# Patient Record
Sex: Male | Born: 1940 | Race: Black or African American | Hispanic: No | Marital: Married | Smoking: Former smoker
Health system: Southern US, Community
[De-identification: ages and names within clinical notes are randomized; demographics above are authoritative.]

## PROBLEM LIST (undated history)

## (undated) ENCOUNTER — Emergency Department (HOSPITAL_COMMUNITY): Payer: Medicare Other | Source: Home / Self Care

## (undated) DIAGNOSIS — E669 Obesity, unspecified: Secondary | ICD-10-CM

## (undated) DIAGNOSIS — I82621 Acute embolism and thrombosis of deep veins of right upper extremity: Secondary | ICD-10-CM

## (undated) DIAGNOSIS — N259 Disorder resulting from impaired renal tubular function, unspecified: Secondary | ICD-10-CM

## (undated) DIAGNOSIS — D649 Anemia, unspecified: Secondary | ICD-10-CM

## (undated) DIAGNOSIS — G4730 Sleep apnea, unspecified: Secondary | ICD-10-CM

## (undated) DIAGNOSIS — J189 Pneumonia, unspecified organism: Secondary | ICD-10-CM

## (undated) DIAGNOSIS — I10 Essential (primary) hypertension: Secondary | ICD-10-CM

## (undated) HISTORY — DX: Anemia, unspecified: D64.9

## (undated) HISTORY — DX: Essential (primary) hypertension: I10

## (undated) HISTORY — DX: Pneumonia, unspecified organism: J18.9

## (undated) HISTORY — DX: Sleep apnea, unspecified: G47.30

## (undated) HISTORY — DX: Obesity, unspecified: E66.9

## (undated) HISTORY — DX: Disorder resulting from impaired renal tubular function, unspecified: N25.9

## (undated) HISTORY — PX: OTHER SURGICAL HISTORY: SHX169

---

## 2007-01-27 ENCOUNTER — Ambulatory Visit: Payer: Self-pay | Admitting: Cardiology

## 2007-02-07 ENCOUNTER — Encounter: Payer: Self-pay | Admitting: Cardiology

## 2007-02-07 ENCOUNTER — Ambulatory Visit: Payer: Self-pay

## 2007-02-21 ENCOUNTER — Ambulatory Visit: Payer: Self-pay | Admitting: Cardiology

## 2007-03-07 ENCOUNTER — Ambulatory Visit: Payer: Self-pay | Admitting: Cardiology

## 2007-03-07 LAB — CONVERTED CEMR LAB
BUN: 16 mg/dL (ref 6–23)
CO2: 29 meq/L (ref 19–32)
Calcium: 9.6 mg/dL (ref 8.4–10.5)
Chloride: 104 meq/L (ref 96–112)
Creatinine, Ser: 1.6 mg/dL — ABNORMAL HIGH (ref 0.4–1.5)
GFR calc non Af Amer: 46 mL/min
Glucose, Bld: 87 mg/dL (ref 70–99)
Potassium: 4.3 meq/L (ref 3.5–5.1)

## 2007-08-09 ENCOUNTER — Ambulatory Visit: Payer: Self-pay | Admitting: Gastroenterology

## 2007-08-21 ENCOUNTER — Ambulatory Visit: Payer: Self-pay | Admitting: Gastroenterology

## 2007-08-21 ENCOUNTER — Encounter: Payer: Self-pay | Admitting: Gastroenterology

## 2007-08-23 ENCOUNTER — Encounter: Payer: Self-pay | Admitting: Gastroenterology

## 2007-08-23 ENCOUNTER — Ambulatory Visit: Payer: Self-pay | Admitting: Cardiology

## 2007-11-10 ENCOUNTER — Ambulatory Visit: Payer: Self-pay | Admitting: Cardiology

## 2008-01-15 ENCOUNTER — Ambulatory Visit: Payer: Self-pay | Admitting: Cardiology

## 2008-02-22 ENCOUNTER — Ambulatory Visit (HOSPITAL_BASED_OUTPATIENT_CLINIC_OR_DEPARTMENT_OTHER): Admission: RE | Admit: 2008-02-22 | Discharge: 2008-02-22 | Payer: Self-pay | Admitting: Cardiology

## 2008-02-22 ENCOUNTER — Encounter: Payer: Self-pay | Admitting: Pulmonary Disease

## 2008-02-27 ENCOUNTER — Ambulatory Visit: Payer: Self-pay | Admitting: Pulmonary Disease

## 2008-02-28 ENCOUNTER — Ambulatory Visit: Payer: Self-pay | Admitting: Cardiology

## 2008-05-02 ENCOUNTER — Ambulatory Visit: Payer: Self-pay

## 2008-05-23 ENCOUNTER — Encounter: Payer: Self-pay | Admitting: Pulmonary Disease

## 2008-05-23 ENCOUNTER — Ambulatory Visit (HOSPITAL_BASED_OUTPATIENT_CLINIC_OR_DEPARTMENT_OTHER): Admission: RE | Admit: 2008-05-23 | Discharge: 2008-05-23 | Payer: Self-pay | Admitting: Cardiology

## 2008-05-25 ENCOUNTER — Ambulatory Visit: Payer: Self-pay | Admitting: Pulmonary Disease

## 2008-06-28 DIAGNOSIS — I10 Essential (primary) hypertension: Secondary | ICD-10-CM | POA: Insufficient documentation

## 2008-06-28 DIAGNOSIS — I429 Cardiomyopathy, unspecified: Secondary | ICD-10-CM | POA: Insufficient documentation

## 2008-06-28 DIAGNOSIS — I428 Other cardiomyopathies: Secondary | ICD-10-CM | POA: Insufficient documentation

## 2008-08-26 ENCOUNTER — Ambulatory Visit: Payer: Self-pay | Admitting: Cardiology

## 2008-08-26 DIAGNOSIS — E669 Obesity, unspecified: Secondary | ICD-10-CM | POA: Insufficient documentation

## 2008-08-26 DIAGNOSIS — N259 Disorder resulting from impaired renal tubular function, unspecified: Secondary | ICD-10-CM | POA: Insufficient documentation

## 2008-09-11 ENCOUNTER — Ambulatory Visit: Payer: Self-pay | Admitting: Cardiology

## 2008-09-11 DIAGNOSIS — I5022 Chronic systolic (congestive) heart failure: Secondary | ICD-10-CM | POA: Insufficient documentation

## 2008-09-12 ENCOUNTER — Telehealth (INDEPENDENT_AMBULATORY_CARE_PROVIDER_SITE_OTHER): Payer: Self-pay | Admitting: *Deleted

## 2008-09-12 ENCOUNTER — Telehealth: Payer: Self-pay | Admitting: Cardiology

## 2008-09-19 ENCOUNTER — Telehealth: Payer: Self-pay | Admitting: Cardiology

## 2008-09-20 LAB — CONVERTED CEMR LAB
BUN: 18 mg/dL (ref 6–23)
CO2: 28 meq/L (ref 19–32)
Calcium: 9.1 mg/dL (ref 8.4–10.5)
Chloride: 107 meq/L (ref 96–112)
GFR calc non Af Amer: 55.61 mL/min (ref >60)
Glucose, Bld: 115 mg/dL — ABNORMAL HIGH (ref 70–99)
Sodium: 139 meq/L (ref 135–145)

## 2009-01-09 ENCOUNTER — Ambulatory Visit: Payer: Self-pay | Admitting: Cardiology

## 2009-01-23 ENCOUNTER — Ambulatory Visit: Payer: Self-pay | Admitting: Pulmonary Disease

## 2009-01-23 DIAGNOSIS — G4733 Obstructive sleep apnea (adult) (pediatric): Secondary | ICD-10-CM | POA: Insufficient documentation

## 2009-02-13 ENCOUNTER — Encounter: Payer: Self-pay | Admitting: Pulmonary Disease

## 2009-04-08 ENCOUNTER — Encounter (INDEPENDENT_AMBULATORY_CARE_PROVIDER_SITE_OTHER): Payer: Self-pay | Admitting: *Deleted

## 2009-05-22 ENCOUNTER — Encounter: Payer: Self-pay | Admitting: Cardiology

## 2009-05-28 ENCOUNTER — Ambulatory Visit: Payer: Self-pay | Admitting: Pulmonary Disease

## 2009-06-13 ENCOUNTER — Ambulatory Visit: Payer: Self-pay | Admitting: Cardiology

## 2009-08-13 ENCOUNTER — Ambulatory Visit: Payer: Self-pay | Admitting: Internal Medicine

## 2009-08-13 DIAGNOSIS — J45909 Unspecified asthma, uncomplicated: Secondary | ICD-10-CM | POA: Insufficient documentation

## 2009-08-13 DIAGNOSIS — R739 Hyperglycemia, unspecified: Secondary | ICD-10-CM | POA: Insufficient documentation

## 2009-08-13 LAB — CONVERTED CEMR LAB
ALT: 32 units/L (ref 0–53)
AST: 26 units/L (ref 0–37)
BUN: 18 mg/dL (ref 6–23)
CO2: 24 meq/L (ref 19–32)
Eosinophils Absolute: 0.2 10*3/uL (ref 0.0–0.7)
Eosinophils Relative: 4 % (ref 0–5)
Glucose, Bld: 104 mg/dL — ABNORMAL HIGH (ref 70–99)
Hemoglobin: 13.3 g/dL (ref 13.0–17.0)
Hgb A1c MFr Bld: 5.8 % — ABNORMAL HIGH (ref <5.7)
Monocytes Relative: 11 % (ref 3–12)
PSA: 1.34 ng/mL (ref 0.10–4.00)
Platelets: 202 10*3/uL (ref 150–400)
Potassium: 4.6 meq/L (ref 3.5–5.3)
RBC: 4.29 M/uL (ref 4.22–5.81)
RDW: 13.8 % (ref 11.5–15.5)
Total CHOL/HDL Ratio: 3.2
Triglycerides: 77 mg/dL (ref <150)

## 2009-08-14 ENCOUNTER — Ambulatory Visit: Payer: Self-pay | Admitting: Diagnostic Radiology

## 2009-08-14 ENCOUNTER — Encounter: Payer: Self-pay | Admitting: Internal Medicine

## 2009-08-14 ENCOUNTER — Ambulatory Visit (HOSPITAL_BASED_OUTPATIENT_CLINIC_OR_DEPARTMENT_OTHER): Admission: RE | Admit: 2009-08-14 | Discharge: 2009-08-14 | Payer: Self-pay | Admitting: Internal Medicine

## 2009-08-14 IMAGING — US US RENAL
1 series · 14 of 21 positions shown · non-contrast
Comparison: None.

CLINICAL DATA: 68-year-old with renal insufficiency and
hypertension.

RENAL/URINARY TRACT ULTRASOUND COMPLETE

[Series 1: us renal · 0.30mm/px · 14 of 21 slices shown]
[im 1/21]
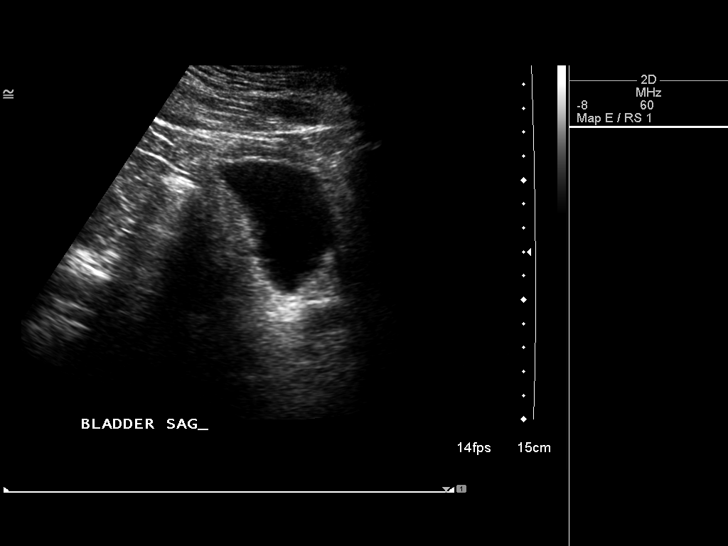
[im 3/21]
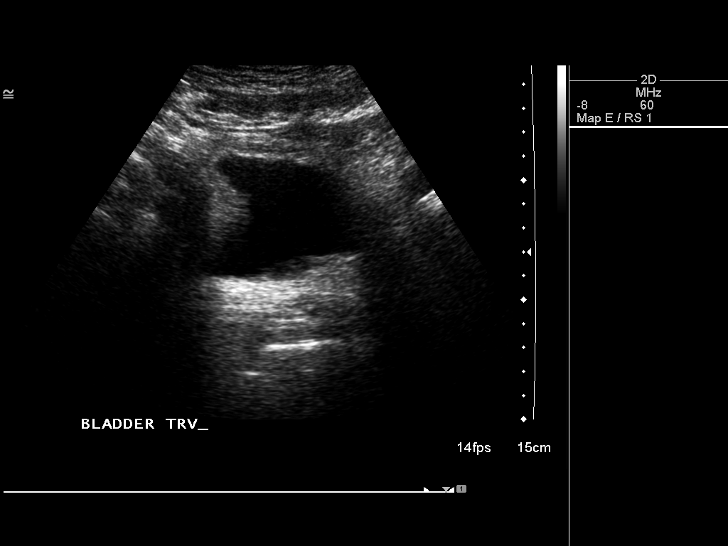
[im 4/21]
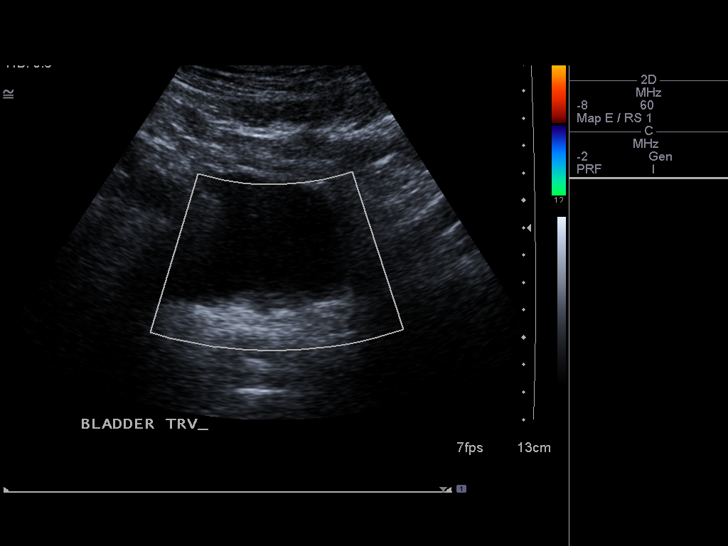
[im 6/21]
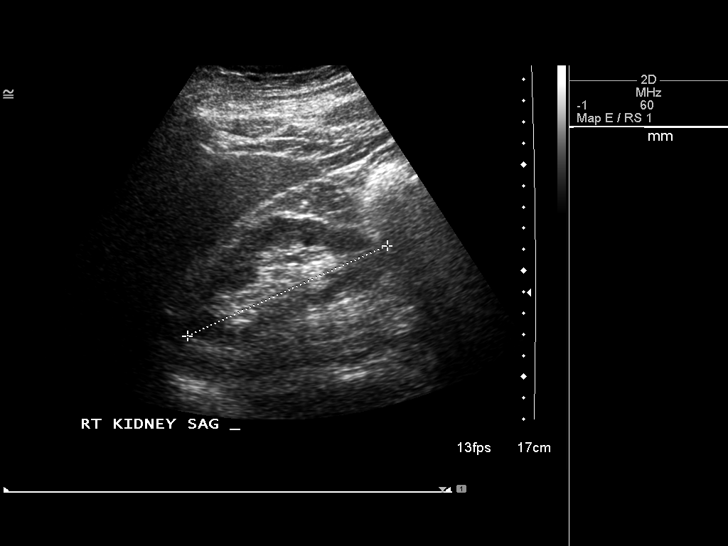
[im 7/21]
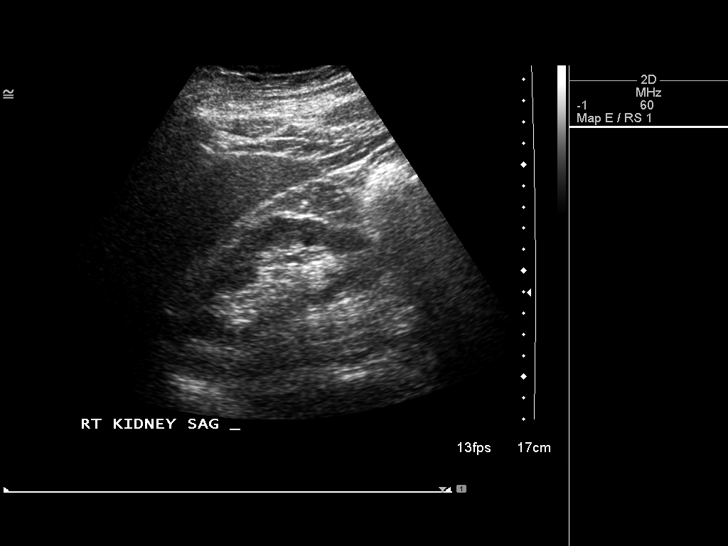
[im 9/21]
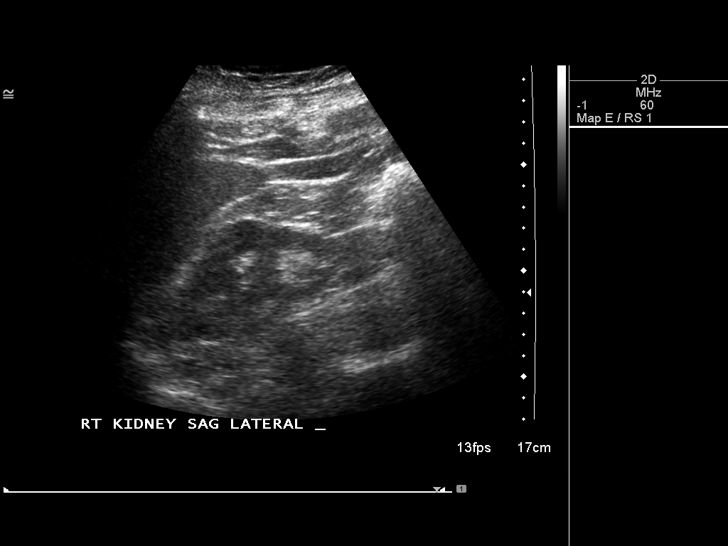
[im 10/21]
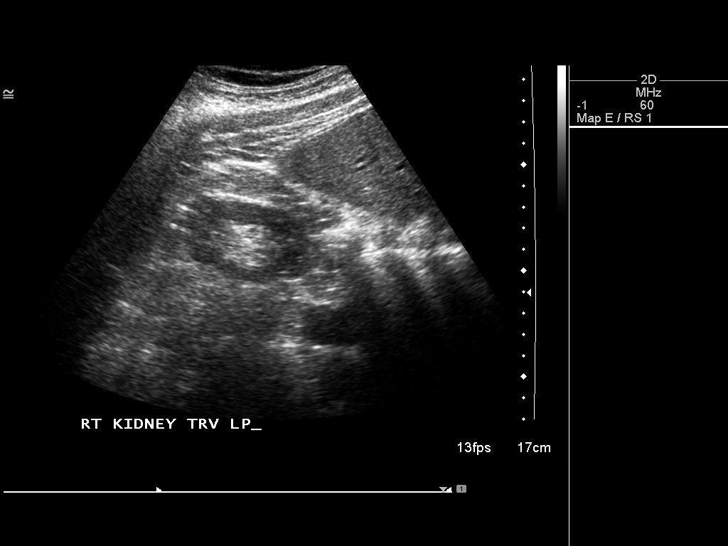
[im 12/21]
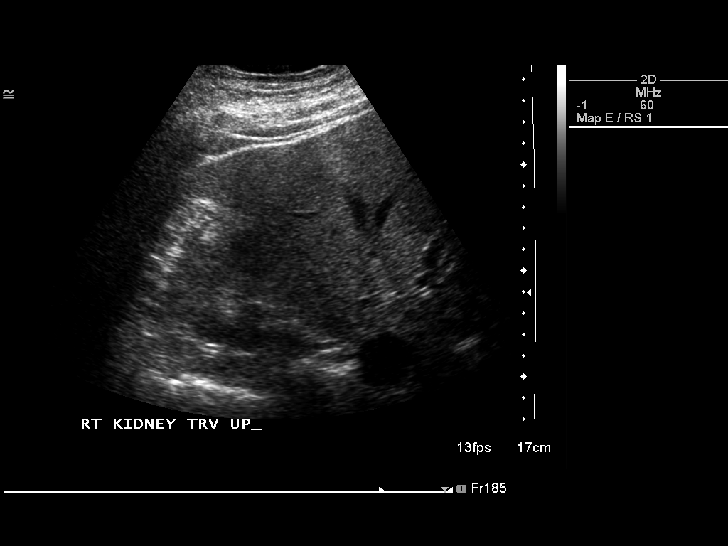
[im 13/21]
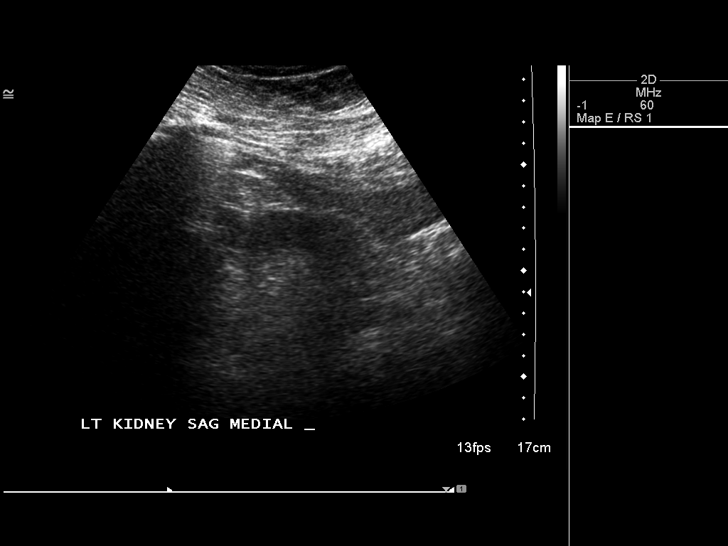
[im 15/21]
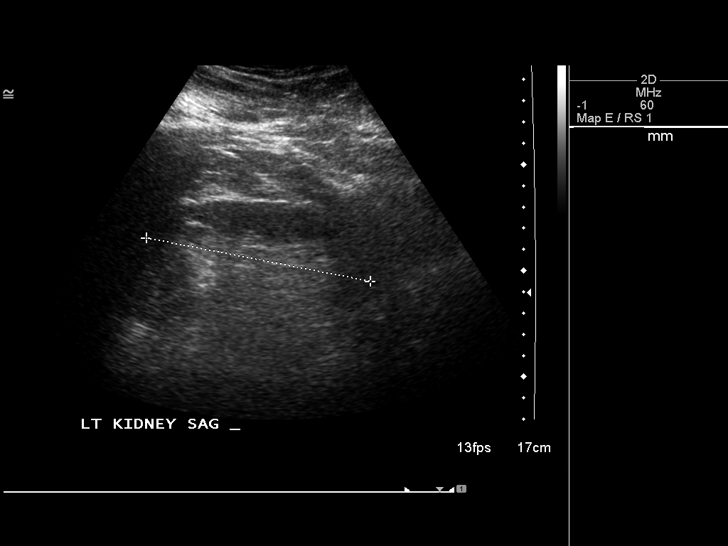
[im 16/21]
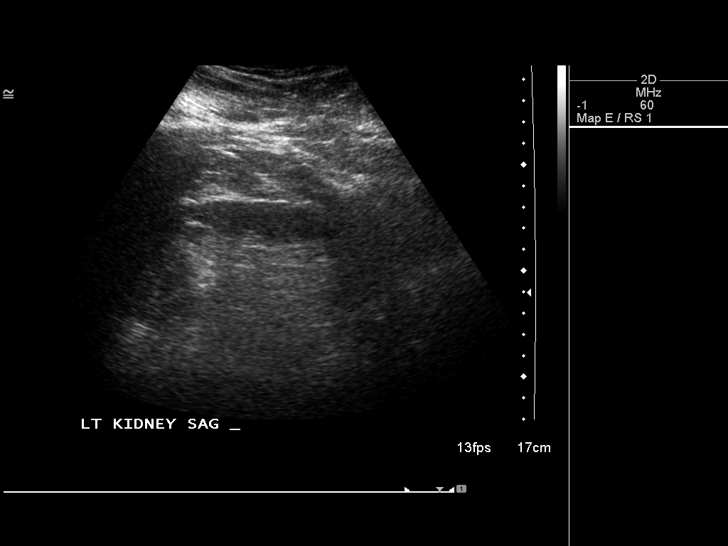
[im 18/21]
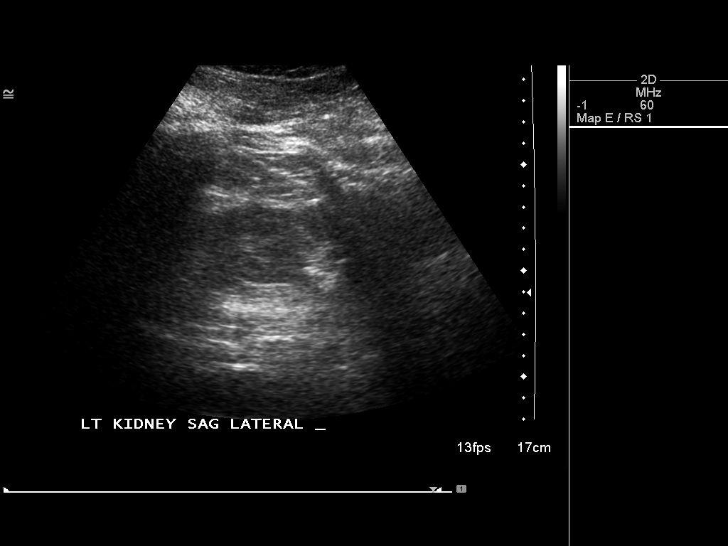
[im 19/21]
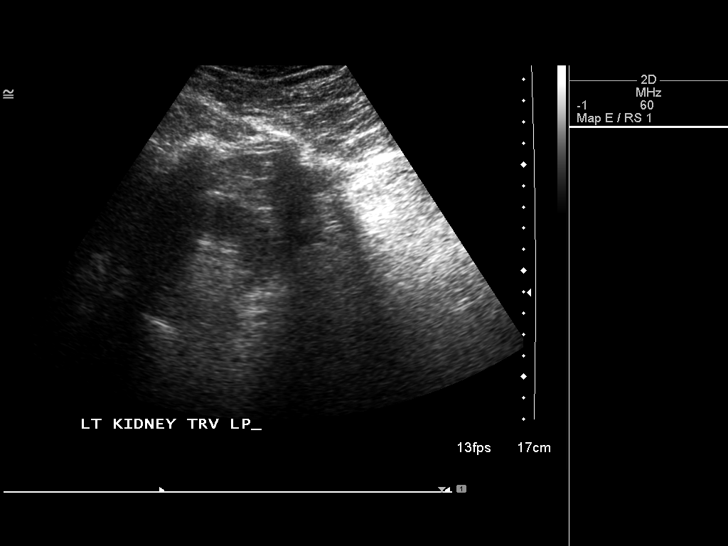
[im 21/21]
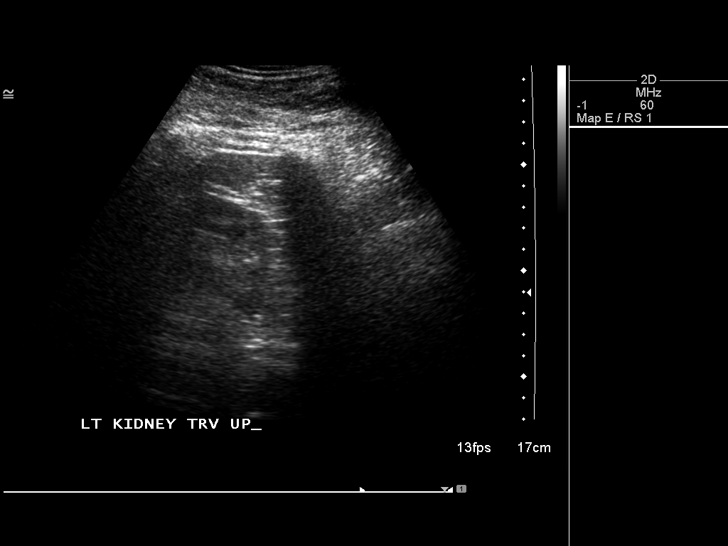

[14 of 21 positions shown; findings below may reference images not displayed]

FINDINGS: Right Kidney:  No hydronephrosis.  Well-preserved cortex with mild
lobularity.  Normal size and parenchymal echotexture without focal
abnormalities. Renal length 10.3 cm.

Left Kidney:  No hydronephrosis.  Well-preserved cortex.  Normal
size and parenchymal echotexture without focal abnormalities. .
Renal length 10.8 cm.

Bladder:  Unremarkable for degree of distension.  Ureteral jets
were not visualized.
IMPRESSION: Normal renal ultrasound for age.  No hydronephrosis.

## 2009-09-01 ENCOUNTER — Encounter: Payer: Self-pay | Admitting: Pulmonary Disease

## 2009-09-06 ENCOUNTER — Encounter: Payer: Self-pay | Admitting: Pulmonary Disease

## 2009-10-01 ENCOUNTER — Ambulatory Visit: Payer: Self-pay | Admitting: Internal Medicine

## 2009-12-29 ENCOUNTER — Ambulatory Visit: Payer: Self-pay | Admitting: Internal Medicine

## 2010-03-24 ENCOUNTER — Ambulatory Visit: Admit: 2010-03-24 | Payer: Self-pay | Admitting: Internal Medicine

## 2010-04-14 NOTE — Letter (Signed)
Summary: CMN/HCS  CMN/HCS   Imported By: Lester [place] 09/10/2009 08:46:36  _____________________________________________________________________  External Attachment:    Type:   Image     Comment:   External Document

## 2010-04-14 NOTE — Letter (Signed)
Summary: Certificate of Medical Necessity   Certificate of Medical Necessity   Imported By: Roderic Ovens 08/22/2009 14:13:11  _____________________________________________________________________  External Attachment:    Type:   Image     Comment:   External Document

## 2010-04-14 NOTE — Assessment & Plan Note (Signed)
Summary: rov for osa   Copy to:  [name] Primary Provider/Referring Provider:  [facility]  CC:  Pt is here for an overdue f/u appt on OSA.  Pt states he is wearing his cpap machine every night.  Approx 6 hours per night.  Pt denied any complaints with mask or pressure.   Marland Kitchen  History of Present Illness: the pt comes in today for f/u of his osa.  He is wearing cpap compliantly, and denies any issues with mask fit or pressure.  He is currently on 12cm, with goal of 15cm.  He is sleeping better, and does feel more alert during the day.    Medications Prior to Update: 1)  Amlodipine Besylate 5 Mg Tabs (Amlodipine Besylate) .Marland Kitchen.. 1 By Mouth Daily 2)  Singulair 10 Mg Tabs (Montelukast Sodium) .... Once Daily 3)  Coreg 25 Mg Tabs (Carvedilol) .... Two Times A Day 4)  Advair Diskus 250-50 Mcg/dose Misc (Fluticasone-Salmeterol) .... Inhale 1 Puff Two Times A Day 5)  Aspirin 81 Mg Tabs (Aspirin) .... Once Daily 6)  Cozaar 50 Mg Tabs (Losartan Potassium) .Marland Kitchen.. 1 Two Times A Day 7)  Viagra 50 Mg Tabs (Sildenafil Citrate) .... One By Mouth 1 Hour Prior To Sexual Activity  Allergies (verified): 1)  ! * Bidil 2)  ! Ace Inhibitors  Review of Systems      See HPI  Vital Signs:  Patient profile:   70 year old male Height:      72 inches Weight:      264.38 pounds BMI:     35.99 O2 Sat:      97 % on Room air Temp:     98.5 degrees F oral Pulse rate:   65 / minute BP sitting:   140 / 82  (right arm) Cuff size:   regular  Vitals Entered By: Arman Filter LPN (May 28, 2009 12:03 PM)  O2 Flow:  Room air CC: Pt is here for an overdue f/u appt on OSA.  Pt states he is wearing his cpap machine every night.  Approx 6 hours per night.  Pt denied any complaints with mask or pressure.    Comments Medications reviewed with patient Arman Filter LPN  May 28, 2009 12:03 PM    Physical Exam  General:  ow male in nad Nose:  no skin breakdown or pressure necrosis from cpap mask Neurologic:   alert, not sleepy.  moves all 4.   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  He is tolerating the device well, and has no issues with pressure or mask fit.  He is sleeping well, and feels his alertness has improved.  He is not at his optimal pressure currently, and will go ahead and get his pressure increased to 15cm.  Other Orders: Est. Patient Level III (60454) DME Referral (DME)  Patient Instructions: 1)  will increase pressure to 15cm. 2)  work on weight loss 3)  followup with me in 12mos.   Immunization History:  Influenza Immunization History:    Influenza:  historical (03/16/2007)

## 2010-04-14 NOTE — Assessment & Plan Note (Signed)
Summary: 1 month follow up/mhf   Vital Signs:  Patient profile:   70 year old Brandon [name] Weight:      250.75 pounds BMI:     35.Brandon O2 Sat:      97 % on Room air Temp:     98.4 degrees F oral Pulse rate:   53 / minute Pulse rhythm:   regular Resp:     18 per minute BP sitting:   116 / 80  (left arm) Cuff size:   large  Vitals Entered By: Glendell Docker CMA (October 01, 2009 10:15 AM)  O2 Flow:  Room air CC: Rm 3- 1 Month Follow up  Is Patient Diabetic? No Pain Assessment Patient in pain? no      Comments dis not get the shingles vaccine, requesting additional information- Patient VIS sheet provided to patient   Primary Care Provider:  DThomos Lemons DO  CC:  Rm 3- 1 Month Follow up .  History of Present Illness: 70 y/o AA Brandon [name] for f/u he notes breathing better since change to bisoprolol rarely uses rescue inhaler no dizziness  BP is stable  obesity - pt able to make lifestyle changes.  wt loss since prev visit  Preventive Screening-Counseling & Management  Alcohol-Tobacco     Smoking Status: quit  Allergies: 1)  ! * Bidil 2)  ! Ace Inhibitors  Past History:  Past Medical History:  1. Cardiomyopathy with a negative cardiac catheterization in the past       (EF approximately 40-45%).   2. Hypertension x30 years.  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  asthma: daughter heart disease:mother cancer: father (multiple myeloma)   sister has hx of lupus (died at young age) sisters with diabetes    Social History: The patient is retired. 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 ([place] )   He is married. - second marriage 8 years  He has three  children.   He quit smoking 20 years ago in 08-08-88.  started at age 42.  1 ppd.  He drinks alcohol occasionally.     Grew up in [place] - attended [employer]  HS first wife died of breat ca Aug 08, 1997    Physical Exam  General:  alert, well-developed, and well-nourished.   Lungs:  normal respiratory effort, normal breath sounds, no crackles, and no wheezes.   Heart:  normal rate, regular rhythm, and no gallop.   Extremities:  trace left pedal edema and trace right pedal edema.     Impression & Recommendations:  Problem # 1:  OBESITY, UNSPECIFIED (ICD-278.00) Assessment Improved pt decreased carb intake and sweets good progressive.  goal wt 230 lbs  Problem # 2:  ASTHMA (ICD-493.90) Assessment: Improved has not used rescue inhaler in months.  breathing improved overall since changing to more selective B Blocker. switch to lower dose of advair.  consider DC advair if pt well controlled in 6 months  His updated medication list for this problem includes:    Singulair 10 Mg Tabs (Montelukast sodium) ..... Once daily    Advair Diskus 100-50 Mcg/dose Aepb (Fluticasone-salmeterol) ..... One dose two times a day    Proventil Hfa 108 (90 Base)  Mcg/act Aers (Albuterol sulfate) .Marland Kitchen... 2 puffs by mouth every 6 hours as needed  Problem # 3:  RENAL INSUFFICIENCY (ICD-588.9) renal u/s was normal. pt advised to avoid all NSAIDs.  use tylenol as needed  Complete Medication List: 1)  Amlodipine Besylate 5 Mg Tabs (Amlodipine besylate) .Marland Kitchen.. 1 by mouth daily 2)  Singulair 10 Mg Tabs (Montelukast sodium) .... Once daily 3)  Advair Diskus 100-50 Mcg/dose Aepb (Fluticasone-salmeterol) .... One dose two times a day 4)  Aspirin 81 Mg Tabs (Aspirin) .... Once daily 5)  Cozaar 50 Mg Tabs (Losartan potassium) .Marland Kitchen.. 1 two times a day 6)  Viagra 50 Mg Tabs (Sildenafil citrate) .... One by mouth 1 hour prior to sexual activity 7)  Bisoprolol Fumarate 10 Mg Tabs (Bisoprolol fumarate) .... One by mouth once  daily 8)  Zostavax 23557 Unt/0.79ml Solr (Zoster vaccine live) .... Administer vaccine x 1 9)  Proventil Hfa 108 (90 Base) Mcg/act Aers (Albuterol sulfate) .... 2 puffs by mouth every 6 hours as needed  Patient Instructions: 1)  Please schedule a follow-up appointment in 6 months. Prescriptions: BISOPROLOL FUMARATE 10 MG TABS (BISOPROLOL FUMARATE) one by mouth once daily  #30 x 5   Entered and Authorized by:   D. Thomos Lemons DO   Signed by:   D. Thomos Lemons DO on 10/01/2009   Method used:   Electronically to        [facility] [address] (retail)       [phone]       [place], [place]  [address]       Ph: [phone]       Fax: [phone]   RxID:   [id number] ADVAIR DISKUS 100-50 MCG/DOSE AEPB (FLUTICASONE-SALMETEROL) one dose two times a day  #1 x 5   Entered and Authorized by:   D. Thomos Lemons DO   Signed by:   D. Thomos Lemons DO on 10/01/2009   Method used:   Electronically to        [facility] [address] (retail)       [phone]       [place], [place]  [address]       Ph: [phone]       Fax: [phone]   RxID:   [id number]   Current Allergies (reviewed today): ! * BIDIL ! ACE INHIBITORS

## 2010-04-14 NOTE — Letter (Signed)
   [facility] [address] [place], [place]  [address]  [place] Phone: [phone]      August 14, 2009   Brandon [name] [address] [place], [place] [address]  RE:  LAB RESULTS  Dear  [name],  The following is an interpretation of your most recent lab tests.  Please take note of any instructions provided or changes to medications that have resulted from your lab work.  ELECTROLYTES:  Good - no changes needed  KIDNEY FUNCTION TESTS:  Good - no changes needed, Stable - no changes needed  LIVER FUNCTION TESTS:  Good - no changes needed  LIPID PANEL:  Stable - no changes needed Triglyceride: 77   Cholesterol: 156   LDL: 92   HDL: 49   Chol/HDL%:  3.2 Ratio  THYROID STUDIES:  Thyroid studies normal TSH: 1.087     DIABETIC STUDIES:  Fair - schedule a follow-up appointment Blood Glucose: 104   HgbA1C: 5.8     CBC:  Good - no changes needed  Kidney ultrasound - normal       Sincerely Yours,    Dr. Thomos Lemons

## 2010-04-14 NOTE — Letter (Signed)
Summary: CMN for CPAP Mask/[facility]  CMN for CPAP Mask/[facility]   Imported By: Sherian Rein 09/10/2009 15:47:37  _____________________________________________________________________  External Attachment:    Type:   Image     Comment:   External Document

## 2010-04-14 NOTE — Assessment & Plan Note (Signed)
Summary: flu shot/mhf  Nurse Visit   Vital Signs:  Patient profile:   70 year old male Temp:     97.6 degrees F oral  Vitals Entered By: Mervin Kung CMA Duncan Dull) (December 29, 2009 1:54 PM)  Allergies: 1)  ! * Bidil 2)  ! Ace Inhibitors  Orders Added: 1)  Flu Vaccine 72yrs + MEDICARE PATIENTS [Q2039] 2)  Administration Flu vaccine - MCR [G0008] Prescriptions: SINGULAIR 10 MG TABS (MONTELUKAST SODIUM) once daily  #30 x 4   Entered by:   Mervin Kung CMA (AAMA)   Authorized by:   D. Thomos Lemons DO   Signed by:   Mervin Kung CMA (AAMA) on 12/29/2009   Method used:   Electronically to        [facility] [address] [place] (retail)       [phone]       [place], [place]  [address]       Ph: [phone]       Fax: [phone]   RxID:   [id number]   Flu Vaccine Consent Questions     Do you have a history of severe allergic reactions to this vaccine? no    Any prior history of allergic reactions to egg and/or gelatin? no    Do you have a sensitivity to the preservative Thimersol? no    Do you have a past history of Guillan-Barre Syndrome? no    Do you currently have an acute febrile illness? no    Have you ever had a severe reaction to latex? no    Vaccine information given and explained to patient? yes    Are you currently pregnant? no    Lot Number:[id number]   Exp Date:09/12/2010   Site Given  Left Deltoid IM.  Nicki Guadalajara Fergerson CMA Duncan Dull)  December 29, 2009 2:05 PM  l

## 2010-04-14 NOTE — Letter (Signed)
Summary: Appointment - Missed  [facility]     [place], [place]    Phone:   Fax:      April 08, 2009 MRN: [id number]   Brandon [name] [address] [place], [place]  [address]   Dear [name],  Our records indicate you missed your appointment on   04-04-2009  with Dr.  Antoine Poche    It is very important that we reach you to reschedule this appointment. We look forward to participating in your health care needs. Please contact us at the number listed above at your earliest convenience to reschedule this appointment.     Sincerely,  Lorne Skeens   [facility] Scheduling Team

## 2010-04-14 NOTE — Letter (Signed)
Summary: CMN for CPAP Supplies/[facility]  CMN for CPAP Supplies/[facility]   Imported By: Sherian Rein 09/10/2009 08:35:11  _____________________________________________________________________  External Attachment:    Type:   Image     Comment:   External Document

## 2010-04-14 NOTE — Letter (Signed)
Summary: Certificate of Medical Necessity   Certificate of Medical Necessity   Imported By: Roderic Ovens 04/15/2009 13:00:19  _____________________________________________________________________  External Attachment:    Type:   Image     Comment:   External Document

## 2010-04-14 NOTE — Assessment & Plan Note (Signed)
Summary: ROV PER PT CALL/RSC FROM [facility]   Visit Type:  Follow-up Referring Provider:  [name] Primary Provider:  [facility]  CC:  HTN and Cardiomyopathy.  History of Present Illness: The patient presents for yearly followup of his cardiomyopathy and hypertension. On the last echo in 2008 he had mild left ventricular dysfunctionwith normal coronaries. His EF was 40-45%. This is most likely related to hypertension. Since I last saw him he has had continued management of his sleep apnea and thinks it is better treated. His blood pressures have been well controlled at home. He hasn't been dieting. He has not yet started exercising though he joined the [employer]. He has however lost some weight since his peak weight. He does get dyspneic with moderate exertion but this is unchanged. There is no resting shortness of breath, PND orthopnea. There are no palpitations, presyncope or syncope. He has had some new bilateral lower extremity swelling that goes away when he keeps his feet elevated.  Allergies: 1)  ! * Bidil 2)  ! Ace Inhibitors  Past History:  Past Medical History: Reviewed history from 01/23/2009 and no changes required.  1. Cardiomyopathy with a negative cardiac catheterization in the past       (EF approximately 40-45%).   2. Hypertension x30 years.  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   Vital Signs:  Patient profile:   70 year old male Height:      72 inches Weight:      256 pounds BMI:     34.85 Pulse rate:   54 / minute Resp:     16 per minute BP sitting:   134 / 90  (right arm)  Vitals Entered By: Marrion Coy, CNA (June 13, 2009 1:52 PM)  Physical Exam  General:  Well developed, well nourished, in no acute distress. Head:  normocephalic and atraumatic Eyes:   PERRLA/EOM intact; conjunctiva and lids normal. Mouth:  Teeth, gums and palate normal. Oral mucosa normal. Neck:  Neck supple, no JVD. No masses, thyromegaly or abnormal cervical nodes. Chest Wall:  no deformities or breast masses noted Lungs:  Clear bilaterally to auscultation and percussion. Abdomen:  Bowel sounds positive; abdomen soft and non-tender without masses, organomegaly, or hernias noted. No hepatosplenomegaly, obese Msk:  Back normal, normal gait. Muscle strength and tone normal. Extremities:  mild bilateral lower extremity edema Neurologic:  Alert and oriented x 3. Skin:  Intact without lesions or rashes. Psych:  Normal affect.   Detailed Cardiovascular Exam  Neck    Carotids: Carotids full and equal bilaterally without bruits.      Neck Veins: Normal, no JVD.    Heart    Inspection: no deformities or lifts noted.      Palpation: normal PMI with no thrills palpable.      Auscultation: regular rate and rhythm, S1, S2 without murmurs, rubs, gallops, or clicks.    Vascular    Abdominal Aorta: no palpable masses, pulsations, or audible bruits.      Femoral Pulses: normal femoral pulses bilaterally.      Pedal Pulses: normal pedal pulses bilaterally.      Radial Pulses: normal radial pulses bilaterally.      Peripheral Circulation: no clubbing, cyanosis, or edema noted with normal capillary refill.     Impression &  Recommendations:  Problem # 1:  CARDIOMYOPATHY (ICD-425.4) I would not suspect that his ejection fraction is lower. He has no new symptoms. No further testing is indicated.  Problem # 2:  RENAL INSUFFICIENCY (ICD-588.9) It has been a while since his labs have been drawn. I will take the liberty of ordering a basic metabolic profile and other routine labs to include CBC, TSH, fasting lipid profile, hemoglobin A1c and PSA.  Problem # 3:  OBESITY, UNSPECIFIED (ICD-278.00) We had a long discussion about continued strategies for weight loss and I encouraged  much more of this.  Problem # 4:  HYPERTENSION (ICD-401.9) His blood pressure is controlled and he will continue the meds as listed.  Patient Instructions: 1)  Your physician recommends that you schedule a follow-up appointment in: 12 months with Dr Antoine Poche 2)  Your physician recommends that you return for a FASTING lipid, liver, BMP,  PSA, CBC and HA1C.  428.0 414.01 278.00 V58.69. 3)  You have been referred to primary care to be established (Dr Artist Pais) 4)  Your physician recommends that you weigh, daily, at the same time every day, and in the same amount of clothing.  Please record your daily weights on the handout provided and bring it to your next appointment.

## 2010-04-14 NOTE — Assessment & Plan Note (Signed)
Summary: NEW PT REFERRED BY DR HOCHREIN/DT   Vital Signs:  Patient profile:   70 year old [name] Height:      70.5 inches Weight:      258.75 pounds BMI:     36.73 O2 Sat:      96 % on Room air Temp:     98.3 degrees F oral Pulse rate:   54 / minute Pulse rhythm:   irregular Resp:     16 per minute BP sitting:   128 / 90  (right arm) Cuff size:   large  Vitals Entered By: Glendell Docker CMA (August 13, 2009 10:18 AM)  O2 Flow:  Room air CC: Rm 2- New Patient  Is Patient Diabetic? No Comments Establish Care   Primary Care Provider:  Dondra Spry DO  CC:  Rm 2- New Patient .  History of Present Illness: Brandon [name] with hx of non ischemic cardiomyopathy, CRI and OSA to establish pt referred by [facility] 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 * Bidil 2)  ! Ace Inhibitors  Past History:  Past Medical History:  1. Cardiomyopathy with a negative cardiac catheterization in the past       (EF approximately 40-45%).   2. Hypertension x30 years.  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  asthma: daughter heart disease:mother cancer: father (multiple myeloma)   sister has hx of lupus (died at young age) sisters with diabetes   Social History: The patient is retired. 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 ([place]   He is married. - second marriage 8 years He has three children.   He quit smoking 20 years ago in 08-19-1988.  started at age 77.  1 ppd.  He drinks alcohol occasionally.     Grew up in [place] - attended [employer]  HS first wife died of breat ca August 19, 1997  Smoking Status:  quit Packs/Day:  1.0 Caffeine use/day:  1 cup coffee daily Does Patient Exercise:  no  Review of Systems       The patient complains of dyspnea on exertion.  The patient denies weight loss, weight gain, chest pain, abdominal pain, severe indigestion/heartburn, and depression.  occ heartburn symptoms,  hx of sour taste and nocturnal reflux symptoms All other systems were reviewed and were negative.   Physical Exam  General:  alert, well-developed, and well-nourished.   Head:  normocephalic and atraumatic.   Eyes:  pupils equal, pupils round, and pupils reactive to light.   Ears:  R ear normal and L ear normal.   Mouth:  upper dental plate,  lower partial plate, pharynx pink and moist.   Neck:  supple, no masses, and no carotid bruits.   Lungs:  normal respiratory effort, normal breath sounds, no crackles, and no wheezes.   Heart:  normal rate, regular rhythm, and no gallop.   Abdomen:  soft, non-tender, no masses, no hepatomegaly, and no splenomegaly.   Extremities:  1+ left pedal edema and 1+ right pedal edema.   Neurologic:  cranial nerves II-XII intact and gait normal.   Psych:  normally interactive, good eye  contact, not anxious appearing, and not depressed appearing.     Impression & Recommendations:  Problem # 1:  HYPERTENSION (ICD-401.9) I suspect coreg contributing to asthma.  change to bisoprolol.  The following medications were removed from the medication list:    Coreg 25 Mg Tabs (Carvedilol) .Marland Kitchen..Marland Kitchen Two times a day His updated medication list for this problem includes:    Amlodipine Besylate 5 Mg Tabs (Amlodipine besylate) .Marland Kitchen... 1 by mouth daily    Cozaar 50 Mg Tabs (Losartan potassium) .Marland Kitchen... 1 two times a day    Bisoprolol Fumarate 10 Mg Tabs (Bisoprolol fumarate) ..... One by mouth once daily  Orders: T-Basic Metabolic Panel [phone]) T-TSH ([phone]) T-PSA ([phone])  Problem # 2:  ASTHMA (ICD-493.90) Stable on maintenance meds.  he uses advair regularly. consider taper off advair if symptoms improve with more selective b blocker  His updated medication list for this problem includes:    Singulair 10 Mg Tabs (Montelukast sodium) ..... Once daily    Advair Diskus 250-50 Mcg/dose Misc (Fluticasone-salmeterol) ..... Inhale 1 puff two times a day  Orders: T-CBC w/Diff ([id number])  Problem # 3:  RENAL INSUFFICIENCY (ICD-588.9) Hx of CRI.  baseline Cr 1.6.  presumed secondary to htn.  check renal u/s  Orders: Ultrasound (Ultrasound) T-Hepatic Function ([id number])  Problem # 4:  HYPERGLYCEMIA (ICD-790.29) screen for diabetes.  Pt counseled on diet and exercise.  Orders: T- Hemoglobin A1C ([id number]) T-Lipid Profile [phone]) T-Hepatic Function [phone])  Labs Reviewed: Creat: 1.6 (09/11/2008)     Problem # 5:  OBSTRUCTIVE SLEEP APNEA (ICD-327.23) continue CPAP.  encouraged wt loss.  discussed wt loss strategies  Complete Medication List: 1)  Amlodipine Besylate 5 Mg Tabs (Amlodipine besylate) .Marland Kitchen.. 1 by mouth daily 2)  Singulair 10 Mg Tabs (Montelukast sodium) .... Once daily 3)  Advair Diskus 250-50 Mcg/dose Misc (Fluticasone-salmeterol)  .... Inhale 1 puff two times a day 4)  Aspirin 81 Mg Tabs (Aspirin) .... Once daily 5)  Cozaar 50 Mg Tabs (Losartan potassium) .Marland Kitchen.. 1 two times a day 6)  Viagra 50 Mg Tabs (Sildenafil citrate) .... One by mouth 1 hour prior to sexual activity 7)  Bisoprolol Fumarate 10 Mg Tabs (Bisoprolol fumarate) .... One by mouth once daily 8)  Zostavax 51884 Unt/0.15ml Solr (Zoster vaccine live) .... Administer vaccine x 1  Other Orders: Pneumococcal Vaccine (16606) Admin 1st Vaccine (30160)  Patient Instructions: 1)  Limit your carbohydrates to 30 grams per meal (100 grams per day) 2)  Avoid OTC NSAIDs (ibuprofen, motrin, aleve etc) 3)  Stop carvedilol 4)  Avoid sugary  beverages (fruit juices, sweet tea, gatorade) 5)  Please schedule a follow-up appointment in 1 month. 6)  Goal weight loss 1 to 2 lbs per week 7)  [email or web address] 8)  Limit your calories to 1800 cal per day Prescriptions: ZOSTAVAX 44010 UNT/0.65ML SOLR (ZOSTER VACCINE LIVE) administer vaccine x 1  #1 x 0   Entered and Authorized by:   D. Thomos Lemons DO   Signed by:   D. Thomos Lemons DO on 08/13/2009   Method used:   Print then Give to Patient   RxID:   [id number] BISOPROLOL FUMARATE 10 MG TABS (BISOPROLOL FUMARATE) one by mouth once daily  #30 x 2   Entered and Authorized by:   D. Thomos Lemons DO   Signed by:   D. Thomos Lemons DO on 08/13/2009   Method used:   Electronically to        [facility] [address] (retail)       [phone]       [place], [place]  [address]       Ph: [phone]       Fax: [phone]   RxID:   [id number]    Preventive Care Screening  Last Tetanus Booster:    Date:  07/26/2006    Results:  Historical    Current Allergies (reviewed today): ! * BIDIL ! ACE INHIBITORS   Immunizations Administered:  Pneumonia Vaccine:    Vaccine Type: Pneumovax    Site: left deltoid    Mfr: Merck    Dose: 0.5 ml    Route: IM    Given by:  Glendell Docker CMA    Exp. Date: 07/03/2010    Lot #: [id number]    VIS given: 12/19/2007

## 2010-07-04 ENCOUNTER — Other Ambulatory Visit: Payer: Self-pay | Admitting: *Deleted

## 2010-07-04 MED ORDER — AMLODIPINE BESYLATE 5 MG PO TABS: 5.0000 mg | ORAL_TABLET | Freq: Every day | ORAL | Status: DC

## 2010-07-13 ENCOUNTER — Telehealth: Payer: Self-pay | Admitting: Cardiology

## 2010-07-13 MED ORDER — AMLODIPINE BESYLATE 5 MG PO TABS: 5.0000 mg | ORAL_TABLET | Freq: Every day | ORAL | Status: DC

## 2010-07-13 NOTE — Telephone Encounter (Signed)
Pt needs rx refill amlodipine [facility] # [phone]

## 2010-07-28 NOTE — Procedures (Signed)
NAME:  Brandon [name], Brandon [name]               ACCOUNT NO.:  [id number]   MEDICAL RECORD NO.:  [id number]          PATIENT TYPE:  OUT   LOCATION:  SLEEP CENTER                 FACILITY:  [facility]   PHYSICIAN:  Barbaraann Share, MD,FCCPDATE OF BIRTH:  1940/10/24   DATE OF STUDY:  02/22/2008                            NOCTURNAL POLYSOMNOGRAM   REFERRING PHYSICIAN:  [name], MD, [facility]   INDICATION FOR STUDY:  Hypersomnia with sleep apnea.   EPWORTH SLEEPINESS SCORE:  11.   MEDICATIONS:   SLEEP ARCHITECTURE:  The patient had a total sleep time of 208 minutes  with no slow wave sleep and only 11 minutes of REM.  Sleep onset latency  was prolonged at 55 minutes, and REM onset was very prolonged at 193  minutes.  Sleep efficiency was poor at 56%.   RESPIRATORY DATA:  The patient was found to have 62 obstructive apneas  and 35 obstructive hypopneas for an apnea-hypopnea index of 28 events  per hour.  He was also noted to have 46 respiratory effort related  arousals, giving him a respiratory disturbance index of 41 events per  hour.  The events were not positional and there was moderate snoring  noted throughout.  The patient did not meet split-night criteria  secondary to most of his events occurring after 2 a.m.   OXYGEN DATA:  There was O2 desaturation as low as 71% with the patient  obstructive events.   CARDIAC DATA:  Occasional PAC and PVCs noted throughout.   MOVEMENT-PARASOMNIA:  The patient did not have any leg jerks or abnormal  behaviors seen.   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  There was O2 desaturation as low as 71%.  The  patient did not meet split-night criteria secondary to the majority of  those events occurring after 2 a.m.  Treatment for this  degree of sleep apnea should focus primarily on CPAP as well as weight  loss.  Occasional PAC and PVCs  noted, however, no clinically no  significant arrhythmia was seen.      Barbaraann Share, MD,FCCP  Diplomate, American Board of Sleep  Medicine  Electronically Signed     [facility]/MEDQ  D:  02/27/2008 16:10:11  T:  02/28/2008 08:45:58  Job:  [id number]

## 2010-07-28 NOTE — Assessment & Plan Note (Signed)
[facility]                            CARDIOLOGY OFFICE NOTE   JHOAN, SCHMIEDER                        MRN:          [id number]  DATE:02/21/2007                            DOB:          Mar 18, 1940    PRIMARY CARE PHYSICIAN:  [facility], [address]   REASON FOR VISIT:  Evaluate patient with cardiomyopathy and  hypertension.   HISTORY OF PRESENT ILLNESS:  The patient is a pleasant, 70 year old  gentleman with a history of apparent nonischemic cardiomyopathy. He was  cared for in another cardiology office. I do not yet have these records  but the patient has reported cardiac catheterization with no evidence of  coronary disease. I did get an echocardiogram on him in late November  and it does demonstrate his EF to be 40-45%. He was not taking his  medications at that time for control of his blood pressure which I  suspect is the etiology of his cardiomyopathy. I restarted carvedilol  6.25 mg b.i.d. He has been on amlodipine and isosorbide as well as other  medicines listed.   He had no problems taking the Coreg. He has had no new dyspnea. He  denies any PND or orthopnea. He has had no palpitations, presyncope or  syncope. He denies any chest discomfort, neck or arm discomfort.   PAST MEDICAL HISTORY:  Hypertension x30 years, cardiomyopathy (EF 40-  45%).   ALLERGIES/INTOLERANCES:  BIDIL made him dizzy and hypotensive.   MEDICATIONS:  1. Coreg 6.25 mg b.i.d.  2. Amlodipine 5 mg daily.  3. Isosorbide 30 mg daily.  4. Singulair 10 mg daily.  5. Aspirin 81 mg daily.  6. Advair.   REVIEW OF SYSTEMS:  As stated in the HPI and otherwise negative for  other systems.   PHYSICAL EXAMINATION:  GENERAL:  The patient is in no distress.  VITAL SIGNS:  Blood pressure 167/99, heart rate 62 and regular, weight  247 pounds, body mass index 33.  HEENT:  Eyelids unremarkable. Pupils equal round and reactive to light.  Fundi not visualized. Oral mucosa  unremarkable.  NECK:  No jugular venous distention at 45 degrees, carotid upstroke  brisk and symmetric, no bruits, no thyromegaly.  LYMPHATICS:  No  adenopathy.  LUNGS:  Clear to auscultation bilaterally.  BACK:  No costovertebral angle tenderness.  CHEST:  Unremarkable.  HEART:  PMI not displaced or sustained, S1 and S2 within normal limits,  no S3, no S4, no clicks, no rubs, no murmurs.  ABDOMEN:  Obese, positive bowel sounds, normal in frequency and pitch,  no bruits, no rebound, no guarding, no midline pulsatile mass, no  organomegaly.  SKIN:  No rashes, no nodules.  EXTREMITIES:  2+ pulses throughout, no clubbing, no cyanosis, no edema.  NEUROLOGIC:  Oriented to person, place and time. Cranial nerves II-XII  grossly intact. Motor grossly intact.   ASSESSMENT/PLAN:  1. Cardiomyopathy. I suspect the patient's having class 1symptoms at      this point. Plan continued titration of his medications. Today I am      going to add lisinopril 10 mg daily.  We talked about cough and      angioedema.  2. Hypertension. We are going to manage this in the context of      treating his cardiomyopathy.  3. Renal insufficiency. I understand he had a creatinine of 1.45 from      labs in October. Will keep a very close eye on his creatinine as we      institute the ACE inhibitor.  4. Obesity. He understands he needs to lose weight with diet and      exercise.  5. Followup. I will see him back in 2 weeks for his blood work as well      as medication titration.     [name], MD, [facility]  Electronically Signed    JH/MedQ  DD: 02/21/2007  DT: 02/22/2007  Job #: [id number]

## 2010-07-28 NOTE — Assessment & Plan Note (Signed)
[facility]                            CARDIOLOGY OFFICE NOTE   Brandon [name], Brandon [name]                        MRN:          [id number]  DATE:01/27/2007                            DOB:          1940-09-08    PRIMARY CARE PHYSICIAN:  [employer].   REASON FOR PRESENTATION:  Evaluate patient with cardiomyopathy.   HISTORY OF PRESENT ILLNESS:  Patient is a 70 year old African American  gentleman with apparently a past history of heart failure.  He tells me  he was cared for by another practice in town until he ran out of his  insurance.  He was cared for by [facility] after that and now presents  here as he has gotten his Medicare.  He does describe a catheterization  and echo and Cardiolite all apparently done as an outpatient.  He was  told his heart was weak.  He said at the time he was short of breath  and thought he had asthma.  In retrospect, he was told that it probably  was his heart failure.  The etiology of this was not entirely clear,  though he tells me he had no blockage.  He was apparently on metoprolol,  digoxin, furosemide, Crestor, Diovan.  However, because of finances he  came off of all these medications.  He is currently on amlodipine,  isosorbide and Singulair.  He does not describe any chest discomfort,  neck or arm discomfort.  He does not have any palpitations, presyncope  or syncope.  He is currently not describing any PND or orthopnea.  He  does get around and tries to be active, though he does not walk  routinely.  He did run out of his blood pressure medications yesterday  so he did not take them this morning.   PAST MEDICAL HISTORY:  1. Hypertension x30 years.  2. Cardiomyopathy (details pending).   PAST SURGICAL HISTORY:  None.   ALLERGIES:  BIDIL made him dizzy and hypotensive.   MEDICATIONS:  1. Amlodipine 5 mg daily.  2. Isosorbide 30 mg daily.  3. Singulair 10 mg daily.   SOCIAL HISTORY:  The patient  is retired.  He is married.  He has three  children.  He quit smoking 20 years ago after one pack per day for 25  years.  He drinks alcohol occasionally.   FAMILY HISTORY:  Contributory for his mother dying with heart disease at  age 57.   REVIEW OF SYSTEMS:  As stated in the HPI and positive for reflux.  Negative for other systems.   PHYSICAL EXAMINATION:  VITAL SIGNS:  Blood pressure 166/95, heart rate  69 and regular, weight 245 pounds.  Body mass index 33.  GENERAL APPEARANCE:  The patient is in no distress.  HEENT:  Eyelids unremarkable.  Pupils are equal, round and reactive to  light.  Fundi not visualized.  Oral mucosa unremarkable.  NECK:  No jugular venous distension at 45 degrees.  Carotid upstrokes  brisk and symmetric, no bruits, no thyromegaly.  LYMPHATICS:  No cervical, axillary or inguinal adenopathy.  CHEST:  Unremarkable.  LUNGS:  Clear to auscultation bilaterally.  BACK:  No costovertebral angle tenderness.  CARDIOVASCULAR:  PMI not displaced or sustained.  S1 and S2 within  normal limits.  No S3, no S4, no clicks, no rubs, no murmurs.  ABDOMEN:  Flat, positive bowel sounds, normal in frequency and pitch, no  bruits, no rebound, no guarding, no midline pulsatile mass, no  hepatomegaly, no splenomegaly.  SKIN:  No rashes, no nodules.  EXTREMITIES:  Pulses 2+ throughout, no clubbing, cyanosis, or edema.  NEUROLOGIC:  Oriented to person, place and time.  Cranial nerves II-XII  grossly intact.  Motor grossly intact throughout.   EKG:  Sinus rhythm, rate 74, axis within normal limits, intervals within  normal limits.  Premature ventricular contractions.  Nonspecific lateral  changes.   ASSESSMENT/PLAN:  1. Cardiomyopathy.  The patient seems to have cardiomyopathy though I      do not know this by physical examination.  I will get the old      records from [place] where he was seen.  I suspect this would      be related to his hypertension.  He is out of many  of his      medications.  I am going to begin to titrate his medications by      starting beta-blocker again.  I will begin carvedilol 6.25 mg      b.i.d.  I will not add an ACE inhibitor at this point as I am      titrating medications one step at a time.  However, this would be      the goal.  For now, he will remain on amlodipine.  2. Hypertension.  Will manage this in the context of treating his      cardiomyopathy.  3. Dyslipidemia.  The patient apparently had dyslipidemia in the past.      He did have an LDL of 124 recently.  He is currently off of      Statins.  I would like to review his catheterization and make      further recommendations about treatment.  4. Obesity.  He does have a body mass index that puts him in the obese      range.  Will discuss weight loss, diet and exercise slowly over      time.     [name], MD, [facility]  Electronically Signed    JH/MedQ  DD: 01/27/2007  DT: 01/29/2007  Job #: [id number]   cc:   [facility]

## 2010-07-28 NOTE — Assessment & Plan Note (Signed)
[facility]                            CARDIOLOGY OFFICE NOTE   Brandon [name], Brandon [name]                        MRN:          [id number]  DATE:03/07/2007                            DOB:          1940-11-19    PRIMARY CARE PHYSICIAN:  [facility], [address]   REASON FOR PRESENTATION:  Evaluate patient with cardiomyopathy and  hypertension.   HISTORY OF PRESENT ILLNESS:  Patient is a pleasant, 70 year old African-  American gentleman.  He has non-ischemic cardiomyopathy.  At the last  visit, I did add an ACE inhibitor.  However, he developed a dry,  nonproductive cough.  He has not had any presyncope or syncope.  He has  had no chest pain, shortness of breath, PND or orthopnea.  He has had no  palpitations, presyncope or syncope.   PAST MEDICAL HISTORY:  Hypertension times 30 years, cardiomyopathy (EF  40-45%.  Patient reported negative cardiac catheterization in another  cardiology office in the past).   ALLERGIES/INTOLERANCES:  BIDIL made him dizzy and hypotensive.   MEDICATIONS:  1. Carvedilol 6.25 mg b.i.d.  2. Amlodipine 5 mg daily.  3. Isosorbide 30 mg daily.  4. Singulair 10 mg daily.  5. Aspirin 81 mg daily.  6. Advair.  7. Lisinopril 10 mg daily.   REVIEW OF SYSTEMS:  As stated in the History of Present Illness, and  otherwise negative for other systems.   PHYSICAL EXAMINATION:  Patient is in no distress.  Blood pressure  149/93, heart rate 64 and regular, weight 248 pounds.  Body mass index  33.  HEENT:  Eyelids unremarkable.  Pupils equal, round and reactive to  light.  Fundi not visualized.  Oral mucosa unremarkable.  NECK:  No jugular venous distention at 45 degrees.  Carotid upstroke  brisk and symmetric.  No bruits, no thyromegaly.  LYMPHATICS:  No cervical, axillary or inguinal adenopathy.  LUNGS:  Clear to auscultation bilaterally.  BACK:  No costovertebral angle tenderness.  CHEST:  Unremarkable.  HEART:  PMI not  displaced or sustained.  S1 and S2 within normal limits.  No S3, no S4, no clicks, no rubs, no murmurs.  ABDOMEN:  Obese, positive bowel sounds, normal in frequency and pitch.  No bruits, no rebound, no guarding, no midline pulsatile mass, no  hepatomegaly, no splenomegaly.  SKIN:  No rashes, no nodules.  EXTREMITIES:  Two-plus pulses throughout, no edema, no cyanosis, no  clubbing.  NEUROLOGIC:  Oriented to person, place and time.  Cranial nerves II  through XII grossly intact.  Motor grossly intact.   ASSESSMENT AND PLAN:  1. Cardiomyopathy:  Patient has mild cardiomyopathy, possibly related      to hypertension.  He is coughing on the ACE inhibitor.  Therefore,      I am going to stop this medication.  I am going to increase his      carvedilol instead.  He will go to 12.5 mg b.i.d.  2. Hypertension:  We will manage this in the context of treating his      mild cardiomyopathy.  3. Obesity:  He understands  the need to lose weight with diet and      exercise.  4. Followup:  I will see him back in about three months for the next      med titration.     [name], MD, [facility]  Electronically Signed    JH/MedQ  DD: 03/07/2007  DT: 03/08/2007  Job #: [id number]   cc:   Derenda Mis, [employer]

## 2010-07-28 NOTE — Assessment & Plan Note (Signed)
[facility]                            CARDIOLOGY OFFICE NOTE   Brandon [name], Brandon [name]                        MRN:          [id number]  DATE:11/10/2007                            DOB:          Mar 07, 1941    PRIMARY:  [facility], [place].   REASON FOR PRESENTATION:  Evaluate patient with cardiomyopathy and  hypertension.   HISTORY OF PRESENT ILLNESS:  The patient is a pleasant 70 year old  African American gentleman with mildly reduced ejection fraction.  We  have been titrating his meds.  At the last visit, he was only taking his  carvedilol once a day and I have reinforced that this was a twice a day  drug.  He is now taking 12.5 mg twice a day and he has actually done  well with this.  He did not notice any increased fatigue,  lightheadedness, presyncope, or syncope.  He has had no new no shortness  of breath.  If he pushes his lawn mower a distance, he will get dyspneic  but this has been baseline.  He is not having any PND or orthopnea.  He  has not had any chest discomfort.   PAST MEDICAL HISTORY:  Cardiomyopathy (he reports negative cardiac  catheterization in the past.  EF 40%-45%) and hypertension x30 years.   ALLERGIES:  Intolerance ACE causes cough, BIDIL made him dizzy and  hypotensive.   MEDICATIONS:  Amlodipine 5 mg daily, Singulair, aspirin 81 mg daily,  Advair, and carvedilol 12.5 mg b.i.d.   REVIEW OF SYSTEMS:  As stated in the HPI and otherwise negative for  other systems.   PHYSICAL EXAMINATION:  GENERAL:  The patient is in no distress.  VITAL SIGNS:  Blood pressure 155/99, heart rate 65 and irregular, and  weight 249 pounds.  HEENT:  Otherwise, unremarkable.  Pupils equal, round, and reactive to  light.  Fundi not visualized.  Oral mucosa moist and pink.  NECK:  No jugular venous distension at 45 degrees, carotid upstroke  brisk and symmetrical, no bruits, no thyromegaly.  LYMPHATICS:  No adenopathy.  LUNGS:  Clear to  auscultation bilaterally.  HEART:  PMI not displaced or sustained.  S1 and S2 are within normal  limits.  No S3, no murmurs.  ABDOMEN:  Obese, positive bowel sounds, normal in frequency and pitch.  No bruits, no rebound, no guarding, no midline pulsatile mass, and no  organomegaly.  EXTREMITIES:  Pulses 2+, no edema.   ASSESSMENT/PLAN:  1. Cardiomyopathy.  Today, we will titrate his carvedilol to 18.75 mg      twice a day.  I am not using an ACE because of his cough.  I might      start on ARB in the future.  However, I think it is reasonable to      maximize his carvedilol first.  2. Obesity.  We discussed loosing weight.  He actually gained 2      pounds.  3. Hypertension.  We will control his blood pressure in the context to      managing his cardiomyopathy.  4. Followup.  I  will see him back in 6 weeks or sooner if needed.     [name], MD, [facility]  Electronically Signed    JH/MedQ  DD: 11/10/2007  DT: 11/11/2007  Job #: [id number]   cc:   Derenda Mis, [place]

## 2010-07-28 NOTE — Assessment & Plan Note (Signed)
[facility]                            CARDIOLOGY OFFICE NOTE   Brandon [name], Brandon [name]                        MRN:          [id number]  DATE:01/15/2008                            DOB:          [birth year]    PRIMARY CARE PHYSICIAN:  [facility], [place].   REASON FOR PRESENTATION:  Evaluate the patient with cardiomyopathy and  hypertension.   HISTORY OF PRESENT ILLNESS:  The patient is a very pleasant 70 year old  gentleman who presents for followup of the above.  Since I last saw him,  he has had no new problems.  I did increase his carvedilol  to 18.75 mg  twice a day.  He had no problems with this.  He has had no presyncope or  syncope.  He has had no new fatigue.  He has had no new dyspnea  described and denies any PND or orthopnea.  He had no chest pressure,  neck or arm discomfort.  He had no palpitations.  Of note, he does  report that he snores quite a bit.  His wife says he looks like he stops  breathing at times according to his report.  He does fall asleep easily  during the day.   PAST MEDICAL HISTORY:  Cardiomyopathy (he reports a negative cardiac  catheterization in the past.  EF was been 40-45%), hypertension x30  years.   ALLERGIES/INTOLERANCES:  ACE caused cough, BIDIL made him dizzy and  hypotensive.   MEDICATIONS:  1. Amlodipine 5 mg daily.  2. Singulair 10 mg daily.  3. Aspirin 81 mg daily.  4. Coreg 18.75 mg b.i.d.  5. Advair.   REVIEW OF SYSTEMS:  As stated in the HPI and otherwise negative for  other systems.   PHYSICAL EXAMINATION:  GENERAL:  The patient is in no distress.  VITAL SIGNS:  Blood pressure 160/94, heart rate 57 and regular, weight  254 pounds, body mass index 34.  HEENT:  Eyelids are unremarkable, pupils are equal, round and reactive  to light, fundi not visualized, oral mucosa remarkable.  NECK:  No jugular venous distention at 45 degrees, carotid upstroke  brisk and symmetric, no bruits, no  thyromegaly.  LYMPHATICS:  No cervical, axillary, or inguinal adenopathy.  LUNGS:  Clear to auscultation bilaterally.  BACK:  No costovertebral mass.  CHEST:  Unremarkable.  HEART:  PMI not displaced or sustained, S1 and S2 within normal limits,  no S3, no S4, no clicks, no rubs, no murmurs.  ABDOMEN:  Flat, positive bowel sounds normal in frequency and pitch, no  bruits, no rebound, no guarding or midline pulsatile mass, no  hepatomegaly, no splenomegaly.  SKIN:  No rashes, no nodule.  EXTREMITIES:  2+ pulse throughout, no edema, no cyanosis, no clubbing.  NEURO:  Oriented to person, place and time, cranial nerves II-XII are  grossly intact, motor grossly intact.   ASSESSMENT AND PLAN:  1. Cardiomyopathy.  The patient will have his beta-blocker titrated to      25 mg twice a day.  I am not using an ACE because of cough.  I may  titrate an ARB in the future.  2. Sleep apnea.  The patient probably does have sleep apnea based on      history.  I will get a sleep study and manage this as needed.  This      could explain some of his difficultly to control hypertension.  3. Hypertension.  We will manage this in the context of treating his      cardiomyopathy.  I probably will start and ARB at the next visit.  4. Obesity.  We discussed the need to lose weight with diet and      exercise.  He is starting to do some walking and I encouraged more      of this.  5. Followup.  I will see him back in 6 months for the next med      titration or sooner based on the sleep study results.     [name], MD, [facility]  Electronically Signed    JH/MedQ  DD: 01/15/2008  DT: 01/16/2008  Job #: [id number]   cc:   Derenda Mis, [place]

## 2010-07-28 NOTE — Procedures (Signed)
NAME:  Brandon [name], Brandon [name]               ACCOUNT NO.:  [id number]   MEDICAL RECORD NO.:  [id number]          PATIENT TYPE:  OUT   LOCATION:  SLEEP CENTER                 FACILITY:  [facility]   PHYSICIAN:  Barbaraann Share, MD,FCCPDATE OF BIRTH:  08/17/40   DATE OF STUDY:  05/23/2008                            NOCTURNAL POLYSOMNOGRAM   REFERRING PHYSICIAN:  [name], MD, [facility]   INDICATIONS FOR THE STUDY:  Hypersomnia with sleep apnea.  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   EPWORTH SCORE:  12.   SLEEP ARCHITECTURE:  The patient had total sleep time of 232 minutes  with no slow wave sleep and decreased quantity of REM.  Sleep onset  latency was mildly prolonged at 32 minutes, and REM onset was normal at  88 minutes.  Sleep efficiency was very poor at 65%.   RESPIRATORY DATA:  The patient underwent CPAP titration with a large  ResMed Quattro full face mask.  This pressure was initiated at 5 cm of  water and gradually increased in order to control both obstructive  events and snoring.  At a final pressure of 15 cm of water, the patient  had excellent control of his events even through supine REM.  Tolerance  appeared to be excellent.   OXYGEN DATA:  The patient had O2 desaturation as low as 87% prior to  being optimized on his CPAP.  He had no significant desaturation at his  optimal CPAP pressure.   CARDIAC:  Occasional PVCs, but no clinically significant arrhythmia were  seen.   MOVEMENT/PARASOMNIA:  The patient did have small numbers of leg jerks  with no significant sleep disruption.   IMPRESSION/RECOMMENDATIONS:  1. Good control of previously documented obstructive sleep apnea with      a large ResMed Quattro full face mask delivering a pressure of 15      cm of water.  The patient should also be encouraged to work      aggressively on      weight loss.  2. Occasional premature ventricular  contraction seen, but no      clinically significant arrhythmia noted.      Barbaraann Share, MD,FCCP  Diplomate, American Board of Sleep  Medicine  Electronically Signed     K[facility]/MEDQ  D:  05/25/2008 14:02:32  T:  05/26/2008 00:46:29  Job:  [id number]

## 2010-07-28 NOTE — Assessment & Plan Note (Signed)
[facility]                            CARDIOLOGY OFFICE NOTE   Brandon [name]                        MRN:          [id number]  DATE:08/23/2007                            DOB:          1941/03/12    PRIMARY CARE:  [facility], [address]   REASON FOR PRESENTATION:  The patient has cardiomyopathy and  hypertension.   HISTORY OF PRESENT ILLNESS:  The patient is a 70 year old African  American  gentleman who presents for follow-up of the above.  He missed  an appointment between the last visit and this one.  I have been trying  to titrate his meds for both management of his blood pressure and his  cardiomyopathy.  However, unfortunately he did not understand that I was  doubling his carvedilol, and he was supposed to be taking 12.5 mg twice  a day.  He has only been taking it once a day.  He says he has not been  having any cardiovascular symptoms.  He denies any chest discomfort,  neck or arm discomfort. He has had no palpitations, presyncope or  syncope.  He has had no PND or orthopnea.   PAST MEDICAL HISTORY:  Cardiomyopathy (he reports negative cardiac  catheterization in the past.  His EF is 40 to 45%), hypertension x 30  years.   ALLERGIES/INTOLERANCE:  BIDIL MADE HIM DIZZY AND HYPOTENSIVE.   MEDICATIONS:  Amlodipine 5 mg daily, Singulair, aspirin 81 mg a day,  Advair, amoxicillin, spironolactone 12.5 mg daily.   REVIEW OF SYSTEMS:  As stated in the HPI, otherwise negative for other  systems.   PHYSICAL EXAMINATION:  The patient is in no distress.  Blood pressure  159/97, heart rate 61 and regular, weight 247 pounds, body mass index  33.  HEENT:  Eyelids unremarkable.  Pupils equal, round and react to light,  fundi not visualized, oral mucosa unremarkable.  NECK:  No jugular distention at 45 degrees, carotid upstroke brisk and  symmetrical, no bruits, thyromegaly.  LYMPHATICS:  No cervical, axillary, or inguinal adenopathy.  LUNGS:  Clear to auscultation bilaterally.  BACK:  No costovertebral angle tenderness.  CHEST:  Unremarkable.  HEART:  PMI not displaced or sustained, S1 and S2 within normal, with no  S3, S4, no clicks, no rubs, no murmurs.  ABDOMEN:  Obese, positive bowel sounds, normal in frequency and pitch,  no bruits, no rebound, no guarding, no midline pulsatile mass.  No  hepatomegaly,  no splenomegaly.  SKIN:  No rashes, denies.  EXTREMITIES:  2+ pulse throughout, no edema, no cyanosis or clubbing.  NEURO:  Oriented to person, place and time, cranial nerves 2-12 grossly  intact, motor grossly intact.   EKG sinus rhythm, rate 69, axis within normal limits.  Intervals within  normal limits.  No acute ST-wave changes.   ASSESSMENT AND PLAN:  1. Cardiomyopathy.  The patient is reinstructed to take his carvedilol      twice a day.  I will continue him on the 12.5 mg dose.  I      reinforced the need for medication titration and management  of his      cardiomyopathy, and I think he understands, and hopefully will be      more compliant.  2. Obesity.  He understands he need to loose weight with diet and      exercise.  3. Hypertension.  Will manage his blood pressure in the context of      managing his cardiomyopathy.  4. Chronic dyspnea.  This seems to be improved.  He is being followed      and treated with Singulair and Advair.  5. Followup:  Will see the patient again in about 2 months for the      next medication titration, or sooner if needed.     [name], MD, [facility]  Electronically Signed    JH/MedQ  DD: 08/23/2007  DT: 08/23/2007  Job #: [id number]   cc:   Derenda Mis

## 2010-07-28 NOTE — Assessment & Plan Note (Signed)
[facility]                            CARDIOLOGY OFFICE NOTE   Brandon [name], Brandon [name]                        MRN:          [id number]  DATE:05/02/2008                            DOB:          1940/05/24    PRIMARY CARE PHYSICIAN:  [facility] [place].   REASON FOR PRESENTATION:  Evaluate the patient with cardiomyopathy and  hypertension.   HISTORY OF PRESENT ILLNESS:  The patient is a very pleasant 70 year old  African American gentleman who presents for followup of the above.  At  the last appointment, I started him on Cozaar 25 mg daily for management  of the above.  Unfortunately, he did not get this filled.  He had death  of a friend and it through things off.  He has now only been taking the  Cozaar for couple of days.  He has otherwise been taking the medications  as listed.  He has had no new cardiovascular complaints.  He denies any  chest pain or shortness of breath.  He has had no palpitation or  presyncope or syncope.  He has had no PND or orthopnea.  He has not been  exercising as much as I would like.  He has done a little bit.  He was  losing weight, but he gained it back when the weather turned colder, and  he started eating a little bit more.   PAST MEDICAL HISTORY:  Cardiomyopathy with a negative cardiac  catheterization in the past (EF approximately 40 to 45%), hypertension  x30 years.   ALLERGIES:  Intolerance to ACE causes cough, [place] made him dizzy and  hypotensive.   MEDICATIONS:  1. Amlodipine 5 mg daily.  2. Singulair 10 mg daily.  3. Aspirin 81 mg daily.  4. Coreg 18.75 mg b.i.d.  5. Advair b.i.d.  6. Cozaar 25 mg daily.   REVIEW OF SYSTEMS:  As stated in the HPI and otherwise negative for  other systems.   PHYSICAL EXAMINATION:  GENERAL:  The patient is in no distress.  VITAL SIGNS:  Blood pressure 142/88, heart rate 66 and regular, weight  252 pounds, body mass index 33.  HEENT:  Eyes are unremarkable.   Pupils are equal, round, and reactive to  light.  Fundi not visualized.  Oral mucosa unremarkable.  NECK:  No jugular venous distention at 45 degrees, carotid upstroke  brisk and symmetric, no bruits, and no thyromegaly.  LYMPHATICS:  No cervical, axillary, or inguinal adenopathy.  LUNGS:  Clear to auscultation bilaterally.  BACK:  No costovertebral angle tenderness.  CHEST:  Unremarkable.  HEART:  PMI not displaced or sustained, S1 and S2 within normal limits.  No S3, no S4, no clicks, no rubs, and no murmurs.  ABDOMEN:  Obese, positive bowel sounds, normal in frequency and pitch,  no bruits, no rebound, no guarding, no midline pulsatile mass, and no  organomegaly.  SKIN:  No rashes.  No nodules.  EXTREMITIES:  Pulses 2+  throughout, no edema, no cyanosis, and no  clubbing.  NEURO:  Oriented to person, place, and time.  Cranial nerves II  through  XII grossly intact.  Motor grossly intact.   EKG, sinus rhythm, rate 66, axis within normal limits, intervals within  normal limits, no acute ST-wave changes.   ASSESSMENT AND PLAN:  1. Cardiomyopathy.  I am going to continue with med titration.  Today,      I am going to increase his carvedilol to 25 mg b.i.d.  He has just      restarted the Cozaar, so leave that dose where it is 25 mg.  He      will continue with salt and fluid restriction.  2. Hypertension.  Blood pressure is improved but still above target.      I am going to make change as above.  3. Obesity.  We discussed weight loss strategy.  Again, my goal is      losing 7 pounds over the next 2 months.  4. Sleep apnea, still waiting for these results.  Further evaluation      based on the sleep study.  5. Followup.  I will see him again in 2 months for med titration.     [name], MD, [facility]  Electronically Signed    JH/MedQ  DD: 05/02/2008  DT: 05/03/2008  Job #: [id number]   cc:   [facility] [place]

## 2010-07-28 NOTE — Assessment & Plan Note (Signed)
[facility]CARE                            CARDIOLOGY OFFICE NOTE   ENOC, GETTER                        MRN:          [id number]  DATE:02/28/2008                            DOB:          07-16-1940    PRIMARY CARE PHYSICIAN:  [facility] in [place].   REASON FOR PRESENTATION:  Evaluate the patient with cardiomyopathy and  hypertension.   HISTORY OF PRESENT ILLNESS:  The patient returns for followup of the  above.  At the last appointment, I increased his carvedilol to 25 mg  twice a day.  He did well with this.  He has had no lightheadedness,  presyncope, or syncope.  He has had no new shortness of breath.  He  denies any PND or orthopnea.  Unfortunately, his blood pressure still  remains high.  Of note, he did have a sleep study.  He thinks it was  positive, but I do not yet have the results.   PAST MEDICAL HISTORY:  1. Cardiomyopathy with a negative cardiac catheterization in the past      (EF approximately 40-45%).  2. Hypertension x30 years.   ALLERGIES/INTOLERANCES:  ACE caused cough, BIDIL made his dizzy and  hypotensive.   MEDICATIONS:  1. Amlodipine 5 mg daily.  2. Singulair 10 mg daily.  3. Aspirin 81 mg daily.  4. Carvedilol 18.75 mg b.i.d.  5. Advair.   REVIEW OF SYSTEMS:  As stated in the HPI and otherwise negative for  other systems.   PHYSICAL EXAMINATION:  GENERAL:  The patient is in no distress.  VITAL SIGNS:  Blood pressure 150/100, heart rate 60 and regular, weight  252 pounds, body mass index 33.  HEENT:  Eyelids unremarkable, pupils equal, round, and reactive to  light, fundi not visualized, oral mucosa normal.  NECK:  No jugular venous distention at 45 degrees, carotid upstroke  brisk and symmetric, no bruits, no thyromegaly.  LYMPHATICS:  No cervical, axillary, or inguinal adenopathy.  LUNGS:  Clear to auscultation bilaterally.  BACK:  No costovertebral angle tenderness.  CHEST:  Unremarkable.  HEART:  PMI  not displaced or sustained, S1 and S2 within normal limits,  no S3, no S4, no click, no rubs, no murmurs.  ABDOMEN:  Obese, positive bowel sounds normal in frequency and pitch, no  bruits, no rebound, no guarding, no midline pulsatile mass, no  hepatomegaly, no splenomegaly.  SKIN:  No rashes, no nodules.  EXTREMITIES:  Pulses 2+ throughout, no edema, no cyanosis, no clubbing.  NEUROLOGIC:  Oriented to person, place, and time, cranial nerves II  through XII grossly intact, motor grossly intact.   ASSESSMENT AND PLAN:  1. Cardiomyopathy.  The patient will continue to have med titration      also for management of his blood pressure.  The neck step will be      to add Cozaar 25 mg daily.  He will remain on the other medications      as listed.  2. Hypertension, as above.  We will manage this in the context of      treating his cardiomyopathy.  3. Sleep apnea.  I will look for the results of the sleep study.  I      suspect he has positive result, and I will send him off for further      evaluation and management.  4. Obesity.  He understands the need to lose weight with diet and      exercise.  5. Followup.  I will see him back in 1 month for the next med      titration.     [name], MD, [facility]  Electronically Signed    JH/MedQ  DD: 02/28/2008  DT: 02/29/2008  Job #: [id number]   cc:   [employer] [facility]

## 2010-08-13 ENCOUNTER — Encounter: Payer: Self-pay | Admitting: Cardiology

## 2010-08-14 ENCOUNTER — Telehealth: Payer: Self-pay | Admitting: Internal Medicine

## 2010-08-14 MED ORDER — FLUTICASONE-SALMETEROL 100-50 MCG/DOSE IN AEPB: 1.0000 | INHALATION_SPRAY | Freq: Two times a day (BID) | RESPIRATORY_TRACT | Status: DC

## 2010-08-14 MED ORDER — BISOPROLOL FUMARATE 10 MG PO TABS: 10.0000 mg | ORAL_TABLET | Freq: Every day | ORAL | Status: DC

## 2010-08-14 NOTE — Telephone Encounter (Signed)
Refill- bisopro fum 10mg  tab. Take one tablet by mouth every day. Qty 30. Last fill 4.18.12

## 2010-08-14 NOTE — Telephone Encounter (Signed)
Rx refill sent to pharmacy. Patient past due for 6 month follow up

## 2010-08-14 NOTE — Telephone Encounter (Signed)
Pt states that rx for advair was denied. He did make an appt for 6.18.12 and would like to know if he could have 1 refill to last until his appt.

## 2010-08-14 NOTE — Telephone Encounter (Signed)
Rx call into the pharmacist Loraine Leriche for a one month supply only. Call placed to patient at [phone], he was advised of Rx refill

## 2010-08-21 ENCOUNTER — Other Ambulatory Visit: Payer: Self-pay | Admitting: Internal Medicine

## 2010-08-25 ENCOUNTER — Ambulatory Visit (INDEPENDENT_AMBULATORY_CARE_PROVIDER_SITE_OTHER): Payer: Medicare Other | Admitting: Cardiology

## 2010-08-25 ENCOUNTER — Encounter: Payer: Self-pay | Admitting: Cardiology

## 2010-08-25 VITALS — BP 150/94 | HR 74 | Resp 16 | Ht 66.0 in | Wt 252.0 lb

## 2010-08-25 DIAGNOSIS — E669 Obesity, unspecified: Secondary | ICD-10-CM

## 2010-08-25 DIAGNOSIS — G4733 Obstructive sleep apnea (adult) (pediatric): Secondary | ICD-10-CM

## 2010-08-25 DIAGNOSIS — I428 Other cardiomyopathies: Secondary | ICD-10-CM

## 2010-08-25 DIAGNOSIS — I10 Essential (primary) hypertension: Secondary | ICD-10-CM

## 2010-08-25 MED ORDER — LOSARTAN POTASSIUM 50 MG PO TABS: 50.0000 mg | ORAL_TABLET | Freq: Two times a day (BID) | ORAL | Status: DC

## 2010-08-25 MED ORDER — AMLODIPINE BESYLATE 5 MG PO TABS: 5.0000 mg | ORAL_TABLET | Freq: Every day | ORAL | Status: DC

## 2010-08-25 MED ORDER — BISOPROLOL FUMARATE 10 MG PO TABS: 10.0000 mg | ORAL_TABLET | Freq: Every day | ORAL | Status: DC

## 2010-08-25 NOTE — Assessment & Plan Note (Signed)
He is not wearing the CPAP every night as he sometimes forgets.  He will work on this.

## 2010-08-25 NOTE — Progress Notes (Signed)
HPI  Allergies  Allergen Reactions  . Ace Inhibitors     REACTION: cough  . Bidil     REACTION: dizziness/hypotensive    Current Outpatient Prescriptions  Medication Sig Dispense Refill  . albuterol (PROVENTIL HFA) 108 (90 BASE) MCG/ACT inhaler Inhale 2 puffs into the lungs every 6 (six) hours as needed.        Marland Kitchen albuterol (PROVENTIL,VENTOLIN) 90 MCG/ACT inhaler Inhale 2 puffs into the lungs every 6 (six) hours as needed.        Marland Kitchen amLODipine (NORVASC) 5 MG tablet Take 1 tablet (5 mg total) by mouth daily.  90 tablet  3  . aspirin 81 MG tablet Take 81 mg by mouth daily.        . bisoprolol (ZEBETA) 10 MG tablet Take 1 tablet (10 mg total) by mouth daily.  30 tablet  0  . Fluticasone-Salmeterol (ADVAIR DISKUS) 100-50 MCG/DOSE AEPB Inhale 1 puff into the lungs 2 (two) times daily.  60 each  0  . losartan (COZAAR) 50 MG tablet Take 50 mg by mouth 2 (two) times daily.        . montelukast (SINGULAIR) 10 MG tablet Take 10 mg by mouth daily.        . sildenafil (VIAGRA) 50 MG tablet Take 50 mg by mouth daily as needed. 1 by moth 1 HOUR PRIOR TO SEXUAL ACTIVITY       . SINGULAIR 10 MG tablet TAKE ONE TABLET BY MOUTH EVERY DAY  30 each  0  . zoster vaccine live, PF, (ZOSTAVAX) 40981 UNT/0.65ML injection Inject 0.65 mLs into the skin once. Administer vaccine x 1.           Past Medical History  Diagnosis Date  . Cardiomyopathy     with a negative cardiac catheterization in the past. (EF appriximately 40-45%)   . HTN (hypertension)     x 30 years  . Obstructive sleep apnea (adult) (pediatric)   . Chronic systolic heart failure   . 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 Obesity, unspecified   . Asthma     Hx of childhood asthma, disappeared for a while, then resurfaced 6-7 years ago.     No past surgical history on file.  ROS:  As stated in the HPI and negative for all other systems.  PHYSICAL EXAM BP 150/94  Pulse 74  Resp 16  Ht 5\' 6"  (1.676 m)  Wt 252  lb (114.306 kg)  BMI 40.67 kg/m2 GENERAL:  Well appearing HEENT:  Pupils equal round and reactive, fundi not visualized, oral mucosa unremarkable NECK:  No jugular venous distention, waveform within normal limits, carotid upstroke brisk and symmetric, no bruits, no thyromegaly LYMPHATICS:  No cervical, inguinal adenopathy LUNGS:  Clear to auscultation bilaterally BACK:  No CVA tenderness CHEST:  Unremarkable HEART:  PMI not displaced or sustained,S1 and S2 within normal limits, no S3, no S4, no clicks, no rubs, no murmurs ABD:  Flat, positive bowel sounds normal in frequency in pitch, no bruits, no rebound, no guarding, no midline pulsatile mass, no hepatomegaly, no splenomegaly, obese EXT:  2 plus pulses throughout, no edema, no cyanosis no clubbing SKIN:  No rashes no nodules NEURO:  Cranial nerves II through XII grossly intact, motor grossly intact throughout PSYCH:  Cognitively intact, oriented to person place and time   EKG:  Sinus bradycardia, rate 58, axis within normal limits, intervals within normal limits, nonspecific inferolateral T wave flattening  ASSESSMENT AND PLAN

## 2010-08-25 NOTE — Patient Instructions (Signed)
Your physician has requested that you have an echocardiogram. Echocardiography is a painless test that uses sound waves to create images of your heart. It provides your doctor with information about the size and shape of your heart and how well your heart's chambers and valves are working. This procedure takes approximately one hour. There are no restrictions for this procedure.  Your physician recommends that you continue on your current medications as directed. Please refer to the Current Medication list given to you today.  Your physician wants you to follow-up in: 1 year. You will receive a reminder letter in the mail two months in advance. If you don't receive a letter, please call our office to schedule the follow-up appointment.  

## 2010-08-25 NOTE — Assessment & Plan Note (Signed)
The patient understands the need to lose weight with diet and exercise. We have discussed specific strategies for this.  

## 2010-08-25 NOTE — Assessment & Plan Note (Signed)
It has been five years since his last echo with an EF of 45%.  I will repeat this.  He will, for now, continue the meds as listed.

## 2010-08-25 NOTE — Assessment & Plan Note (Signed)
The blood pressure continues to be high. I have instructed the patient to record a blood pressure diary and recording this. This will be presented for my review and pending these results I will make further suggestions about changes in therapy for optimal blood pressure control.  

## 2010-08-31 ENCOUNTER — Encounter: Payer: Self-pay | Admitting: Internal Medicine

## 2010-09-08 ENCOUNTER — Other Ambulatory Visit (HOSPITAL_COMMUNITY): Payer: Medicare Other

## 2010-09-22 ENCOUNTER — Ambulatory Visit (HOSPITAL_COMMUNITY): Payer: Medicare Other | Attending: Cardiology

## 2010-09-22 DIAGNOSIS — E669 Obesity, unspecified: Secondary | ICD-10-CM | POA: Insufficient documentation

## 2010-09-22 DIAGNOSIS — I059 Rheumatic mitral valve disease, unspecified: Secondary | ICD-10-CM | POA: Insufficient documentation

## 2010-09-22 DIAGNOSIS — I509 Heart failure, unspecified: Secondary | ICD-10-CM | POA: Insufficient documentation

## 2010-09-22 DIAGNOSIS — I079 Rheumatic tricuspid valve disease, unspecified: Secondary | ICD-10-CM | POA: Insufficient documentation

## 2010-09-22 DIAGNOSIS — I428 Other cardiomyopathies: Secondary | ICD-10-CM | POA: Insufficient documentation

## 2010-09-22 DIAGNOSIS — J45909 Unspecified asthma, uncomplicated: Secondary | ICD-10-CM | POA: Insufficient documentation

## 2010-09-22 DIAGNOSIS — I10 Essential (primary) hypertension: Secondary | ICD-10-CM | POA: Insufficient documentation

## 2010-09-24 ENCOUNTER — Encounter: Payer: Self-pay | Admitting: Internal Medicine

## 2010-09-30 ENCOUNTER — Encounter: Payer: Self-pay | Admitting: Internal Medicine

## 2010-09-30 ENCOUNTER — Ambulatory Visit (INDEPENDENT_AMBULATORY_CARE_PROVIDER_SITE_OTHER): Payer: Medicare Other | Admitting: Internal Medicine

## 2010-09-30 DIAGNOSIS — Z79899 Other long term (current) drug therapy: Secondary | ICD-10-CM

## 2010-09-30 DIAGNOSIS — N289 Disorder of kidney and ureter, unspecified: Secondary | ICD-10-CM

## 2010-09-30 DIAGNOSIS — J45909 Unspecified asthma, uncomplicated: Secondary | ICD-10-CM

## 2010-09-30 DIAGNOSIS — N259 Disorder resulting from impaired renal tubular function, unspecified: Secondary | ICD-10-CM

## 2010-09-30 DIAGNOSIS — I428 Other cardiomyopathies: Secondary | ICD-10-CM

## 2010-09-30 DIAGNOSIS — E785 Hyperlipidemia, unspecified: Secondary | ICD-10-CM

## 2010-09-30 DIAGNOSIS — I10 Essential (primary) hypertension: Secondary | ICD-10-CM

## 2010-09-30 LAB — LIPID PANEL
Cholesterol: 177 mg/dL (ref 0–200)
LDL Cholesterol: 107 mg/dL — ABNORMAL HIGH (ref 0–99)
Total CHOL/HDL Ratio: 3.3 Ratio
Triglycerides: 86 mg/dL (ref <150)

## 2010-09-30 LAB — CBC WITH DIFFERENTIAL/PLATELET
Eosinophils Relative: 3 % (ref 0–5)
HCT: 42.4 % (ref 39.0–52.0)
Lymphocytes Relative: 29 % (ref 12–46)
MCV: 93.4 fL (ref 78.0–100.0)
Monocytes Absolute: 0.5 10*3/uL (ref 0.1–1.0)
Monocytes Relative: 9 % (ref 3–12)
Neutro Abs: 3.5 10*3/uL (ref 1.7–7.7)
Neutrophils Relative %: 59 % (ref 43–77)
RBC: 4.54 MIL/uL (ref 4.22–5.81)
RDW: 13.6 % (ref 11.5–15.5)

## 2010-09-30 LAB — HEPATIC FUNCTION PANEL
Albumin: 3.9 g/dL (ref 3.5–5.2)
Bilirubin, Direct: 0.2 mg/dL (ref 0.0–0.3)
Indirect Bilirubin: 0.4 mg/dL (ref 0.0–0.9)
Total Bilirubin: 0.6 mg/dL (ref 0.3–1.2)

## 2010-09-30 LAB — BASIC METABOLIC PANEL
BUN: 16 mg/dL (ref 6–23)
CO2: 25 mEq/L (ref 19–32)
Calcium: 9.6 mg/dL (ref 8.4–10.5)
Creat: 1.4 mg/dL — ABNORMAL HIGH (ref 0.50–1.35)

## 2010-09-30 MED ORDER — FLUTICASONE-SALMETEROL 100-50 MCG/DOSE IN AEPB: 1.0000 | INHALATION_SPRAY | Freq: Two times a day (BID) | RESPIRATORY_TRACT | Status: DC

## 2010-09-30 MED ORDER — ALBUTEROL SULFATE HFA 108 (90 BASE) MCG/ACT IN AERS: 2.0000 | INHALATION_SPRAY | Freq: Four times a day (QID) | RESPIRATORY_TRACT | Status: DC | PRN

## 2010-10-03 NOTE — Assessment & Plan Note (Signed)
Stable without exacerbation. Refill Proventil and Advair

## 2010-10-03 NOTE — Progress Notes (Signed)
  Subjective:    Patient ID: Brandon [name], male    DOB: 23-Jun-1940, 70 y.o.   MRN: [id number]  HPI Patient presents to clinic for followup of multiple medical problems. Has history of chronic renal insufficiency with normal renal ultrasound. Avoiding anti-inflammatories appropriately. Blood pressure elevated and recheck to be 130/86. Has had no recent asthma flare and is without shortness of breath or wheezing. No thrush with Advair. History of elevated glucose with no formal history of diabetes. Sees urology for prostate cancer screening. History of cardiomyopathy followed by cardiology as well and appears clinically euvolemic. No other complaints  Reviewed past medical history, medications and allergies    Review of Systems see history of present illness     Objective:   Physical Exam    Physical Exam  Vitals reviewed. Constitutional:  appears well-developed and well-nourished. No distress.  HENT: pupils equal round reactive to light, EOM grossly intact. Head: Normocephalic and atraumatic.  Right Ear: Tympanic membrane, external ear and ear canal normal.  Left Ear: Tympanic membrane, external ear and ear canal normal.  Nose: Nose normal.  Mouth/Throat: Oropharynx is clear and moist. No oropharyngeal exudate.  Eyes: Conjunctivae and EOM are normal. Pupils are equal, round, and reactive to light. Right eye exhibits no discharge. Left eye exhibits no discharge. No scleral icterus.  Neck: Neck supple. No thyromegaly present. No carotid bruits Cardiovascular: Normal rate, regular rhythm and normal heart sounds.  Exam reveals no gallop and no friction rub.   No murmur heard. Pulmonary/Chest: Effort normal and breath sounds normal. No respiratory distress.  has no wheezes.  has no rales.  Lymphadenopathy:   no cervical adenopathy.  Neurological:  is alert.  Skin: Skin is warm and dry.  not diaphoretic.  Psychiatric: normal mood and affect.  GU: Bilaterally specific testes without  nodularity. No evidence of hernia on Valsalva.    Assessment & Plan:

## 2010-10-03 NOTE — Assessment & Plan Note (Signed)
Stable. Clinically euvolemic.

## 2010-10-03 NOTE — Assessment & Plan Note (Signed)
Borderline control. Recommend out patient blood pressure log to be submitted for review. Continue current regimen.

## 2010-10-03 NOTE — Assessment & Plan Note (Signed)
Stable. Avoid anti-inflammatories. Obtain CBC, Chem-7

## 2010-12-01 ENCOUNTER — Other Ambulatory Visit: Payer: Self-pay | Admitting: Internal Medicine

## 2010-12-01 NOTE — Telephone Encounter (Signed)
Patient states that he has to go out of town tomorrow morning for an emergency and he needs this singulair refilled tonight.

## 2010-12-01 NOTE — Telephone Encounter (Signed)
Rx refill sent to pharmacy. 

## 2010-12-02 ENCOUNTER — Other Ambulatory Visit: Payer: Self-pay | Admitting: Cardiology

## 2011-02-24 ENCOUNTER — Ambulatory Visit (INDEPENDENT_AMBULATORY_CARE_PROVIDER_SITE_OTHER): Payer: Medicare Other | Admitting: Family

## 2011-02-24 DIAGNOSIS — Z23 Encounter for immunization: Secondary | ICD-10-CM

## 2011-03-30 ENCOUNTER — Telehealth: Payer: Self-pay | Admitting: Family

## 2011-03-30 ENCOUNTER — Ambulatory Visit (INDEPENDENT_AMBULATORY_CARE_PROVIDER_SITE_OTHER): Payer: Medicare Other | Admitting: Internal Medicine

## 2011-03-30 ENCOUNTER — Encounter: Payer: Self-pay | Admitting: Internal Medicine

## 2011-03-30 VITALS — BP 132/90 | HR 68 | Temp 98.4°F | Resp 18 | Wt 253.0 lb

## 2011-03-30 DIAGNOSIS — N259 Disorder resulting from impaired renal tubular function, unspecified: Secondary | ICD-10-CM

## 2011-03-30 DIAGNOSIS — N189 Chronic kidney disease, unspecified: Secondary | ICD-10-CM

## 2011-03-30 DIAGNOSIS — I10 Essential (primary) hypertension: Secondary | ICD-10-CM

## 2011-03-30 LAB — BASIC METABOLIC PANEL
BUN: 14 mg/dL (ref 6–23)
CO2: 27 mEq/L (ref 19–32)
Creat: 1.34 mg/dL (ref 0.50–1.35)
Glucose, Bld: 91 mg/dL (ref 70–99)
Sodium: 139 mEq/L (ref 135–145)

## 2011-03-30 NOTE — Telephone Encounter (Signed)
Lab order entered for May 2013. 

## 2011-03-30 NOTE — Patient Instructions (Signed)
Please schedule fasting labwork prior to your next appointment Cbc, chem7, UA (chronic renal insufficiency)

## 2011-04-04 NOTE — Progress Notes (Signed)
  Subjective:    Patient ID: Brandon [name], male    DOB: [birth year], 71 y.o.   MRN: [id number]  HPI Pt presents to clinic for followup of multiple medical problems. BP minimally elevated but out of medication for 5 days. H/o mild CRI without evidence of volume overload. No other complaints.    Past Medical History  Diagnosis Date  . Cardiomyopathy     with a negative cardiac catheterization in the past. (EF appriximately 40-45%)   . HTN (hypertension)     x 30 years  . Obstructive sleep apnea (adult) (pediatric)   . Chronic systolic heart failure   . 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 Obesity, unspecified   . Asthma     Hx of childhood asthma, disappeared for a while, then resurfaced 6-7 years ago.    No past surgical history on file.  reports that he has quit smoking. He has never used smokeless tobacco. He reports that he drinks alcohol. His drug history not on file. family history includes Asthma in his daughter; Cancer in his father; and Lupus in his sister. Allergies  Allergen Reactions  . Ace Inhibitors     REACTION: cough  . Bidil     REACTION: dizziness/hypotensive     Review of Systems see hpi     Objective:   Physical Exam  Physical Exam  Nursing note and vitals reviewed. Constitutional: Appears well-developed and well-nourished. No distress.  HENT:  Head: Normocephalic and atraumatic.  Right Ear: External ear normal.  Left Ear: External ear normal.  Eyes: Conjunctivae are normal. No scleral icterus.  Neck: Neck supple. Carotid bruit is not present.  Cardiovascular: Normal rate, regular rhythm and normal heart sounds.  Exam reveals no gallop and no friction rub.   No murmur heard. Pulmonary/Chest: Effort normal and breath sounds normal. No respiratory distress. He has no wheezes. no rales.  Lymphadenopathy:    He has no cervical adenopathy.  Neurological:Alert.  Skin: Skin is warm and dry. Not diaphoretic.  Psychiatric: Has a  normal mood and affect.        Assessment & Plan:

## 2011-04-04 NOTE — Assessment & Plan Note (Signed)
Stable. Avoid nsaids. Obtain chem7

## 2011-04-04 NOTE — Assessment & Plan Note (Signed)
Refill and resume bp medication. Check bp as outpt and report results for review.

## 2011-05-04 ENCOUNTER — Telehealth: Payer: Self-pay | Admitting: Family

## 2011-05-04 ENCOUNTER — Ambulatory Visit (HOSPITAL_BASED_OUTPATIENT_CLINIC_OR_DEPARTMENT_OTHER)
Admission: RE | Admit: 2011-05-04 | Discharge: 2011-05-04 | Disposition: A | Discharge status: Home / Self Care | Payer: Medicare Other | Source: Ambulatory Visit | Attending: Family | Admitting: Family

## 2011-05-04 ENCOUNTER — Encounter: Payer: Self-pay | Admitting: Family

## 2011-05-04 ENCOUNTER — Ambulatory Visit (INDEPENDENT_AMBULATORY_CARE_PROVIDER_SITE_OTHER): Payer: Medicare Other | Admitting: Family

## 2011-05-04 DIAGNOSIS — R918 Other nonspecific abnormal finding of lung field: Secondary | ICD-10-CM

## 2011-05-04 DIAGNOSIS — J45909 Unspecified asthma, uncomplicated: Secondary | ICD-10-CM

## 2011-05-04 DIAGNOSIS — R05 Cough: Secondary | ICD-10-CM | POA: Insufficient documentation

## 2011-05-04 DIAGNOSIS — J4489 Other specified chronic obstructive pulmonary disease: Secondary | ICD-10-CM | POA: Insufficient documentation

## 2011-05-04 DIAGNOSIS — J449 Chronic obstructive pulmonary disease, unspecified: Secondary | ICD-10-CM | POA: Insufficient documentation

## 2011-05-04 DIAGNOSIS — R0602 Shortness of breath: Secondary | ICD-10-CM

## 2011-05-04 DIAGNOSIS — I10 Essential (primary) hypertension: Secondary | ICD-10-CM

## 2011-05-04 DIAGNOSIS — J45901 Unspecified asthma with (acute) exacerbation: Secondary | ICD-10-CM

## 2011-05-04 DIAGNOSIS — R059 Cough, unspecified: Secondary | ICD-10-CM | POA: Insufficient documentation

## 2011-05-04 IMAGING — CR DG CHEST 2V
2 series · 2 of 2 positions shown · non-contrast
Comparison: None.

CLINICAL DATA: Cough and shortness of breath.  Asthma.  493.9.

CHEST - 2 VIEW

[w chest pa]
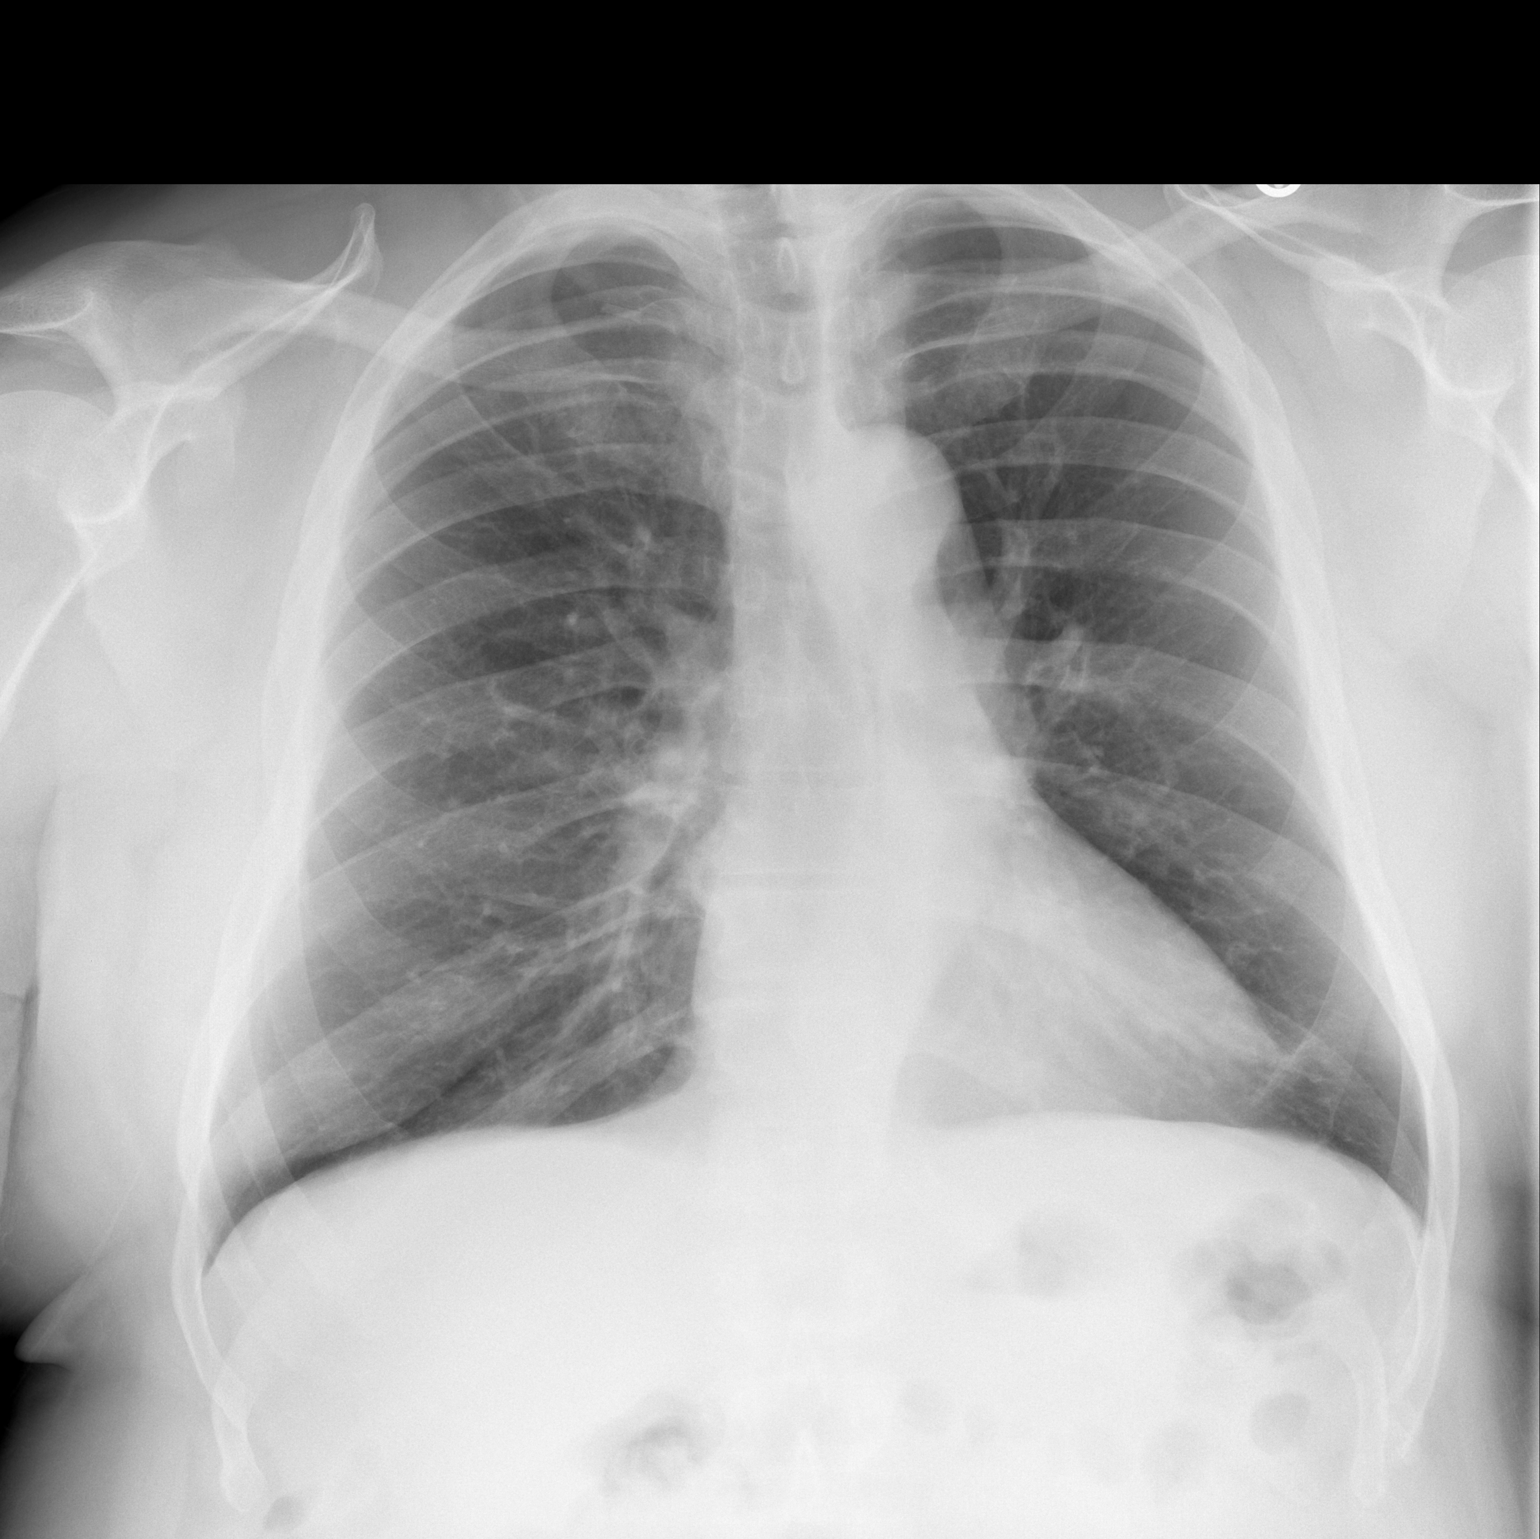

[w chest lat]
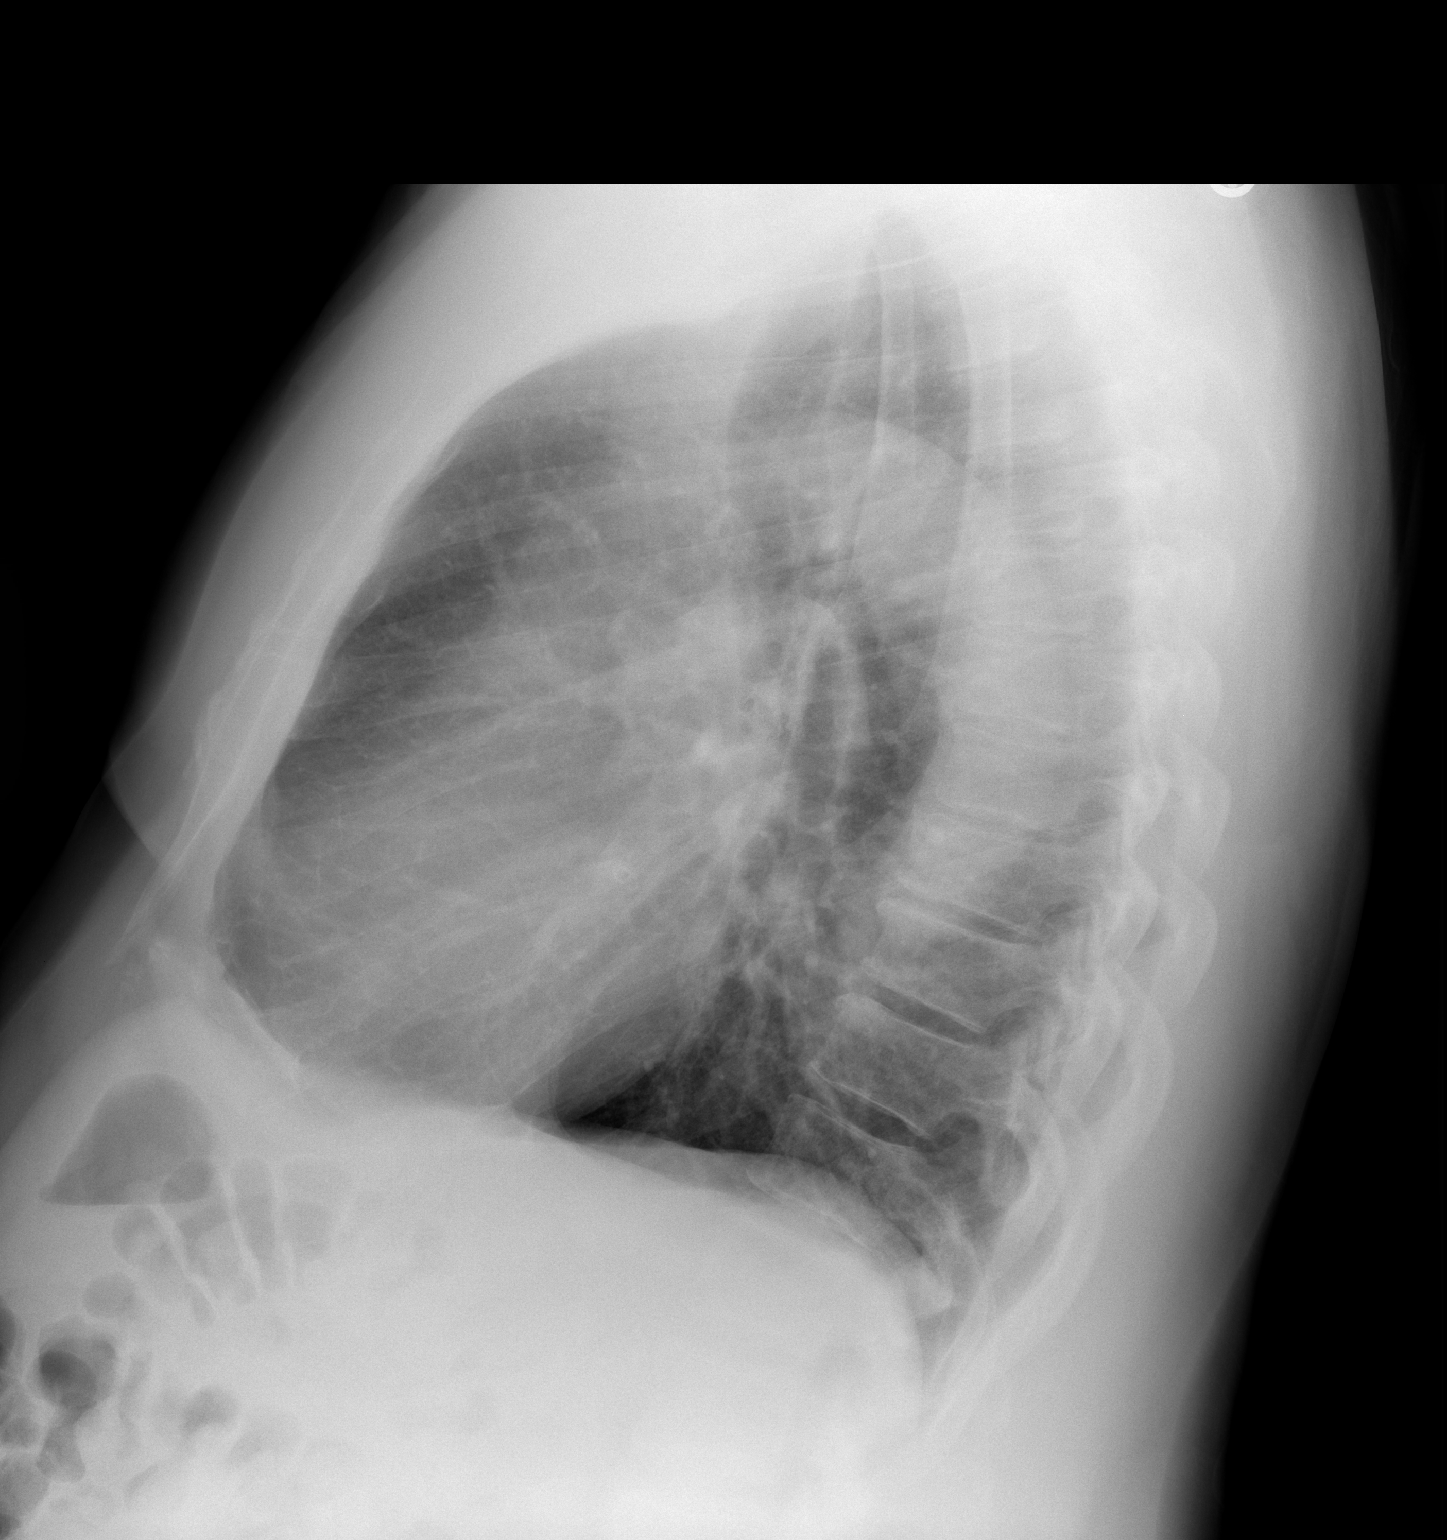

[2 of 2 positions shown; findings below may reference images not displayed]

FINDINGS: The heart size is normal.  The lungs are hyperexpanded.
Minimal bibasilar atelectasis or scarring is evident.  No
significant focal airspace disease is present.  Mild degenerative
changes are noted in the mid thoracic spine.  The soft tissues and
bony thorax are otherwise unremarkable.
IMPRESSION: 1.  Mild changes of COPD.
2.  Minimal scarring or atelectasis at the lung bases, greater on
the left.
3.  No other significant focal airspace disease.

## 2011-05-04 MED ORDER — CEFUROXIME AXETIL 500 MG PO TABS: 500.0000 mg | ORAL_TABLET | Freq: Two times a day (BID) | ORAL | Status: AC

## 2011-05-04 MED ORDER — ALBUTEROL SULFATE (2.5 MG/3ML) 0.083% IN NEBU
2.5000 mg | INHALATION_SOLUTION | Freq: Once | RESPIRATORY_TRACT | Status: AC
  Administered 2011-05-04: 2.5 mg via RESPIRATORY_TRACT

## 2011-05-04 MED ORDER — FLUTICASONE-SALMETEROL 250-50 MCG/DOSE IN AEPB: 1.0000 | INHALATION_SPRAY | Freq: Two times a day (BID) | RESPIRATORY_TRACT | Status: DC

## 2011-05-04 MED ORDER — PREDNISONE 10 MG PO TABS: ORAL_TABLET | ORAL | Status: DC

## 2011-05-04 MED ORDER — ALBUTEROL SULFATE (2.5 MG/3ML) 0.083% IN NEBU: 2.5000 mg | INHALATION_SOLUTION | Freq: Four times a day (QID) | RESPIRATORY_TRACT | Status: DC | PRN

## 2011-05-04 NOTE — Assessment & Plan Note (Signed)
Deteriorated. He tells me that he did not take his AM meds today.  Reminded him to take meds- will repeat on Friday when he returns for follow up.

## 2011-05-04 NOTE — Telephone Encounter (Signed)
Will plan to treat empirically with ceftin.  CXR results and plans for antibiotics reviewed with pt.

## 2011-05-04 NOTE — Progress Notes (Signed)
Subjective:    Patient ID: Brandon [name], male    DOB: [birth year], 71 y.o.   MRN: [id number]  HPI  [name] is a 71 yr old male who presents today with chief complaint of asthma symptoms.  He reports that symptom started 3-4 days ago.  He reports cough- dry.  Feels short of breath, worse with walking.  Reports that it is hard to lay flat.  Denies LE edema or edema.  Denies fever, cough or cold symptoms. Reports that he has hx of pneumonia in the past and "doesn't want this to turn into pneumonia."  He continues advair, singulair, and prn albuterol.     Review of Systems See HPI  Past Medical History  Diagnosis Date  . Cardiomyopathy     with a negative cardiac catheterization in the past. (EF appriximately 40-45%)   . HTN (hypertension)     x 30 years  . Obstructive sleep apnea (adult) (pediatric)   . Chronic systolic heart failure   . 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 Obesity, unspecified   . Asthma     Hx of childhood asthma, disappeared for a while, then resurfaced 6-7 years ago.     History   Social History  . Marital Status: Married    Spouse Name: [name]    Number of Children: 3  . Years of Education: N/A   Occupational History  . retired    Social History Main Topics  . Smoking status: Former Games developer  . Smokeless tobacco: Never Used   Comment: quit smoling in 1990. started when 18, 1 ppd  . Alcohol Use: Yes     occasionaly   . Drug Use: Not on file  . Sexually Active: Not on file   Other Topics Concern  . Not on file   Social History Narrative   Grew up in [place], attended [employer]. First wife died of breast ca in 08-26-1997. 3 children. Remarried- 8 years. 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 Pt signed designated party release granting access to [facility] to his wife [name]. Detailed message may be left on home or cell phone. Roselle Locus August 13, 2009 11:34 am.     No past surgical history on  file.  Family History  Problem Relation Age of Onset  . Cancer Father     multiple melanoma  . Lupus Sister   . Asthma Daughter     Allergies  Allergen Reactions  . Ace Inhibitors     REACTION: cough  . Bidil     REACTION: dizziness/hypotensive    Current Outpatient Prescriptions on File Prior to Visit  Medication Sig Dispense Refill  . albuterol (PROVENTIL HFA) 108 (90 BASE) MCG/ACT inhaler Inhale 2 puffs into the lungs every 6 (six) hours as needed.  1 Inhaler  11  . amLODipine (NORVASC) 5 MG tablet Take 1 tablet (5 mg total) by mouth daily.  30 tablet  11  . aspirin 81 MG tablet Take 81 mg by mouth daily.        . bisoprolol (ZEBETA) 10 MG tablet Take 1 tablet (10 mg total) by mouth daily.  30 tablet  11  . losartan (COZAAR) 50 MG tablet Take 1 tablet (50 mg total) by mouth 2 (two) times daily.  60 tablet  11  . montelukast (SINGULAIR) 10 MG tablet Take 1 tablet (10 mg total) by mouth at bedtime.  30 each  6  . VIAGRA 50  MG tablet TAKE ONE TABLET BY MOUTH ONE HOUR PRIOR TO SEXUAL ACTIVITY  20 each  1  . zoster vaccine live, PF, (ZOSTAVAX) 16109 UNT/0.65ML injection Inject 0.65 mLs into the skin once. Administer vaccine x 1.          No current facility-administered medications on file prior to visit.    BP 175/112  Pulse 63  Temp(Src) 98.2 F (36.8 C) (Oral)  Resp 18  Wt 251 lb 0.6 oz (113.871 kg)  SpO2 91%       Objective:   Physical Exam  Constitutional: He is oriented to person, place, and time. He appears well-developed and well-nourished. No distress.  Cardiovascular: Normal rate and regular rhythm.   No murmur heard. Pulmonary/Chest: Effort normal. No respiratory distress. He has wheezes. He has no rales. He exhibits no tenderness.  Musculoskeletal: He exhibits no edema.  Neurological: He is alert and oriented to person, place, and time.  Psychiatric: He has a normal mood and affect. His behavior is normal. Judgment and thought content normal.           Assessment & Plan:  Pt was given hand written rx for nebulizer/tubing, albuterol nebs.

## 2011-05-04 NOTE — Assessment & Plan Note (Signed)
Will plan to treat with Prednisone.  Increase Advair from 100/50 to 250/50.  CXR shows asymmetric atx.  Will plan to treat empirically with ceftin for bronchitis.  Follow up oxygen sat after neb increased to 95% and pt noted feeling much better. He is to contact us if symptoms worsen and verbalizes understanding.

## 2011-05-04 NOTE — Patient Instructions (Signed)
Please complete your chest x-ray on first floor.  Please follow up on Friday, call sooner if symptoms worsen.

## 2011-05-07 ENCOUNTER — Ambulatory Visit (INDEPENDENT_AMBULATORY_CARE_PROVIDER_SITE_OTHER): Payer: Medicare Other | Admitting: Family

## 2011-05-07 ENCOUNTER — Encounter: Payer: Self-pay | Admitting: Family

## 2011-05-07 DIAGNOSIS — J45901 Unspecified asthma with (acute) exacerbation: Secondary | ICD-10-CM

## 2011-05-07 DIAGNOSIS — I10 Essential (primary) hypertension: Secondary | ICD-10-CM

## 2011-05-07 MED ORDER — AMLODIPINE BESYLATE 10 MG PO TABS: 10.0000 mg | ORAL_TABLET | Freq: Every day | ORAL | Status: DC

## 2011-05-07 NOTE — Assessment & Plan Note (Signed)
Better today but not at goal.  Will increase amlodipine from 5mg  to 10 mg.

## 2011-05-07 NOTE — Assessment & Plan Note (Signed)
Clinically improving.  I encouraged him to start using the albuterol neb.  He will follow up in 1 week.

## 2011-05-07 NOTE — Patient Instructions (Signed)
Please follow up in 1 week, sooner if symptoms worsen or do not continue to improve.

## 2011-05-07 NOTE — Progress Notes (Signed)
Subjective:    Patient ID: Brandon [name], male    DOB: 1940/05/09, 71 y.o.   MRN: [id number]  HPI  [name] is a 71 yr old male who presents for follow up of his bronchitis/asthma.  He was started on prednisone and ceftin last visit.  Advair was increased to 250/50 bid.  He was able to obtain a nebulizer but has not used it.  He reports only faint wheezing at this time.  Denies dyspnea.  He continues to use prednisone and ceftin.  Has not yet started the albuterol nebs.    HTN-  Reports + med compliance today.     Review of Systems    see HPI  Past Medical History  Diagnosis Date  . Cardiomyopathy     with a negative cardiac catheterization in the past. (EF appriximately 40-45%)   . HTN (hypertension)     x 30 years  . Obstructive sleep apnea (adult) (pediatric)   . Chronic systolic heart failure   . 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 Obesity, unspecified   . Asthma     Hx of childhood asthma, disappeared for a while, then resurfaced 6-7 years ago.     History   Social History  . Marital Status: Married    Spouse Name: Brandon [name]    Number of Children: 3  . Years of Education: N/A   Occupational History  . retired    Social History Main Topics  . Smoking status: Former Games developer  . Smokeless tobacco: Never Used   Comment: quit smoling in 1990. started when 18, 1 ppd  . Alcohol Use: Yes     occasionaly   . Drug Use: Not on file  . Sexually Active: Not on file   Other Topics Concern  . Not on file   Social History Narrative   Grew up in [place], attended [employer]. First wife died of breast ca in September 06, 1997. 3 children. Remarried- 8 years. 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 Pt signed designated party release granting access to [facility] to his wife Brandon [name]. Detailed message may be left on home or cell phone. Roselle Locus August 13, 2009 11:34 am.     No past surgical history on file.  Family History  Problem  Relation Age of Onset  . Cancer Father     multiple melanoma  . Lupus Sister   . Asthma Daughter     Allergies  Allergen Reactions  . Ace Inhibitors     REACTION: cough  . Bidil     REACTION: dizziness/hypotensive    Current Outpatient Prescriptions on File Prior to Visit  Medication Sig Dispense Refill  . albuterol (PROVENTIL HFA) 108 (90 BASE) MCG/ACT inhaler Inhale 2 puffs into the lungs every 6 (six) hours as needed.  1 Inhaler  11  . albuterol (PROVENTIL) (2.5 MG/3ML) 0.083% nebulizer solution Take 3 mLs (2.5 mg total) by nebulization every 6 (six) hours as needed for wheezing.  75 mL  1  . aspirin 81 MG tablet Take 81 mg by mouth daily.        . bisoprolol (ZEBETA) 10 MG tablet Take 1 tablet (10 mg total) by mouth daily.  30 tablet  11  . cefUROXime (CEFTIN) 500 MG tablet Take 1 tablet (500 mg total) by mouth 2 (two) times daily.  14 tablet  0  . Fluticasone-Salmeterol (ADVAIR DISKUS) 250-50 MCG/DOSE AEPB Inhale 1 puff into the lungs 2 (two) times  daily.  1 each  3  . losartan (COZAAR) 50 MG tablet Take 1 tablet (50 mg total) by mouth 2 (two) times daily.  60 tablet  11  . montelukast (SINGULAIR) 10 MG tablet Take 1 tablet (10 mg total) by mouth at bedtime.  30 each  6  . predniSONE (DELTASONE) 10 MG tablet 4 tabs daily for 2 days, then 3 tabs daily for 2 days, then 2 tabs daily for 2 days, 1 tab daily for 2 days then stop  20 tablet  0  . sertraline (ZOLOFT) 50 MG tablet Take 50 mg by mouth daily.      Marland Kitchen VIAGRA 50 MG tablet TAKE ONE TABLET BY MOUTH ONE HOUR PRIOR TO SEXUAL ACTIVITY  20 each  1  . zoster vaccine live, PF, (ZOSTAVAX) 16109 UNT/0.65ML injection Inject 0.65 mLs into the skin once. Administer vaccine x 1.           BP 156/70  Pulse 62  Temp(Src) 98 F (36.7 C) (Oral)  Resp 16  Wt 253 lb 1.3 oz (114.796 kg)  SpO2 94%    Objective:   Physical Exam  Constitutional: He appears well-developed and well-nourished.  HENT:  Head: Normocephalic and atraumatic.    Neck: Normal range of motion. Neck supple.  Cardiovascular: Normal rate and regular rhythm.   No murmur heard. Pulmonary/Chest: No respiratory distress. He has wheezes in the right upper field, the left middle field and the left lower field. He has no rhonchi.  Lymphadenopathy:    He has no cervical adenopathy.          Assessment & Plan:

## 2011-05-17 ENCOUNTER — Ambulatory Visit (INDEPENDENT_AMBULATORY_CARE_PROVIDER_SITE_OTHER): Payer: Medicare Other | Admitting: Family

## 2011-05-17 ENCOUNTER — Ambulatory Visit (HOSPITAL_BASED_OUTPATIENT_CLINIC_OR_DEPARTMENT_OTHER)
Admission: RE | Admit: 2011-05-17 | Discharge: 2011-05-17 | Disposition: A | Discharge status: Home / Self Care | Payer: Medicare Other | Source: Ambulatory Visit | Attending: Family | Admitting: Family

## 2011-05-17 ENCOUNTER — Encounter: Payer: Self-pay | Admitting: Family

## 2011-05-17 VITALS — BP 148/90 | HR 69 | Temp 98.0°F | Resp 16 | Wt 257.1 lb

## 2011-05-17 DIAGNOSIS — J45909 Unspecified asthma, uncomplicated: Secondary | ICD-10-CM

## 2011-05-17 DIAGNOSIS — Z23 Encounter for immunization: Secondary | ICD-10-CM

## 2011-05-17 DIAGNOSIS — J45901 Unspecified asthma with (acute) exacerbation: Secondary | ICD-10-CM

## 2011-05-17 DIAGNOSIS — R062 Wheezing: Secondary | ICD-10-CM

## 2011-05-17 DIAGNOSIS — I10 Essential (primary) hypertension: Secondary | ICD-10-CM

## 2011-05-17 IMAGING — CR DG CHEST 2V
2 series · 2 of 2 positions shown · non-contrast
Comparison: [DATE]

CLINICAL DATA: Wheezing

CHEST - 2 VIEW

[w chest pa]
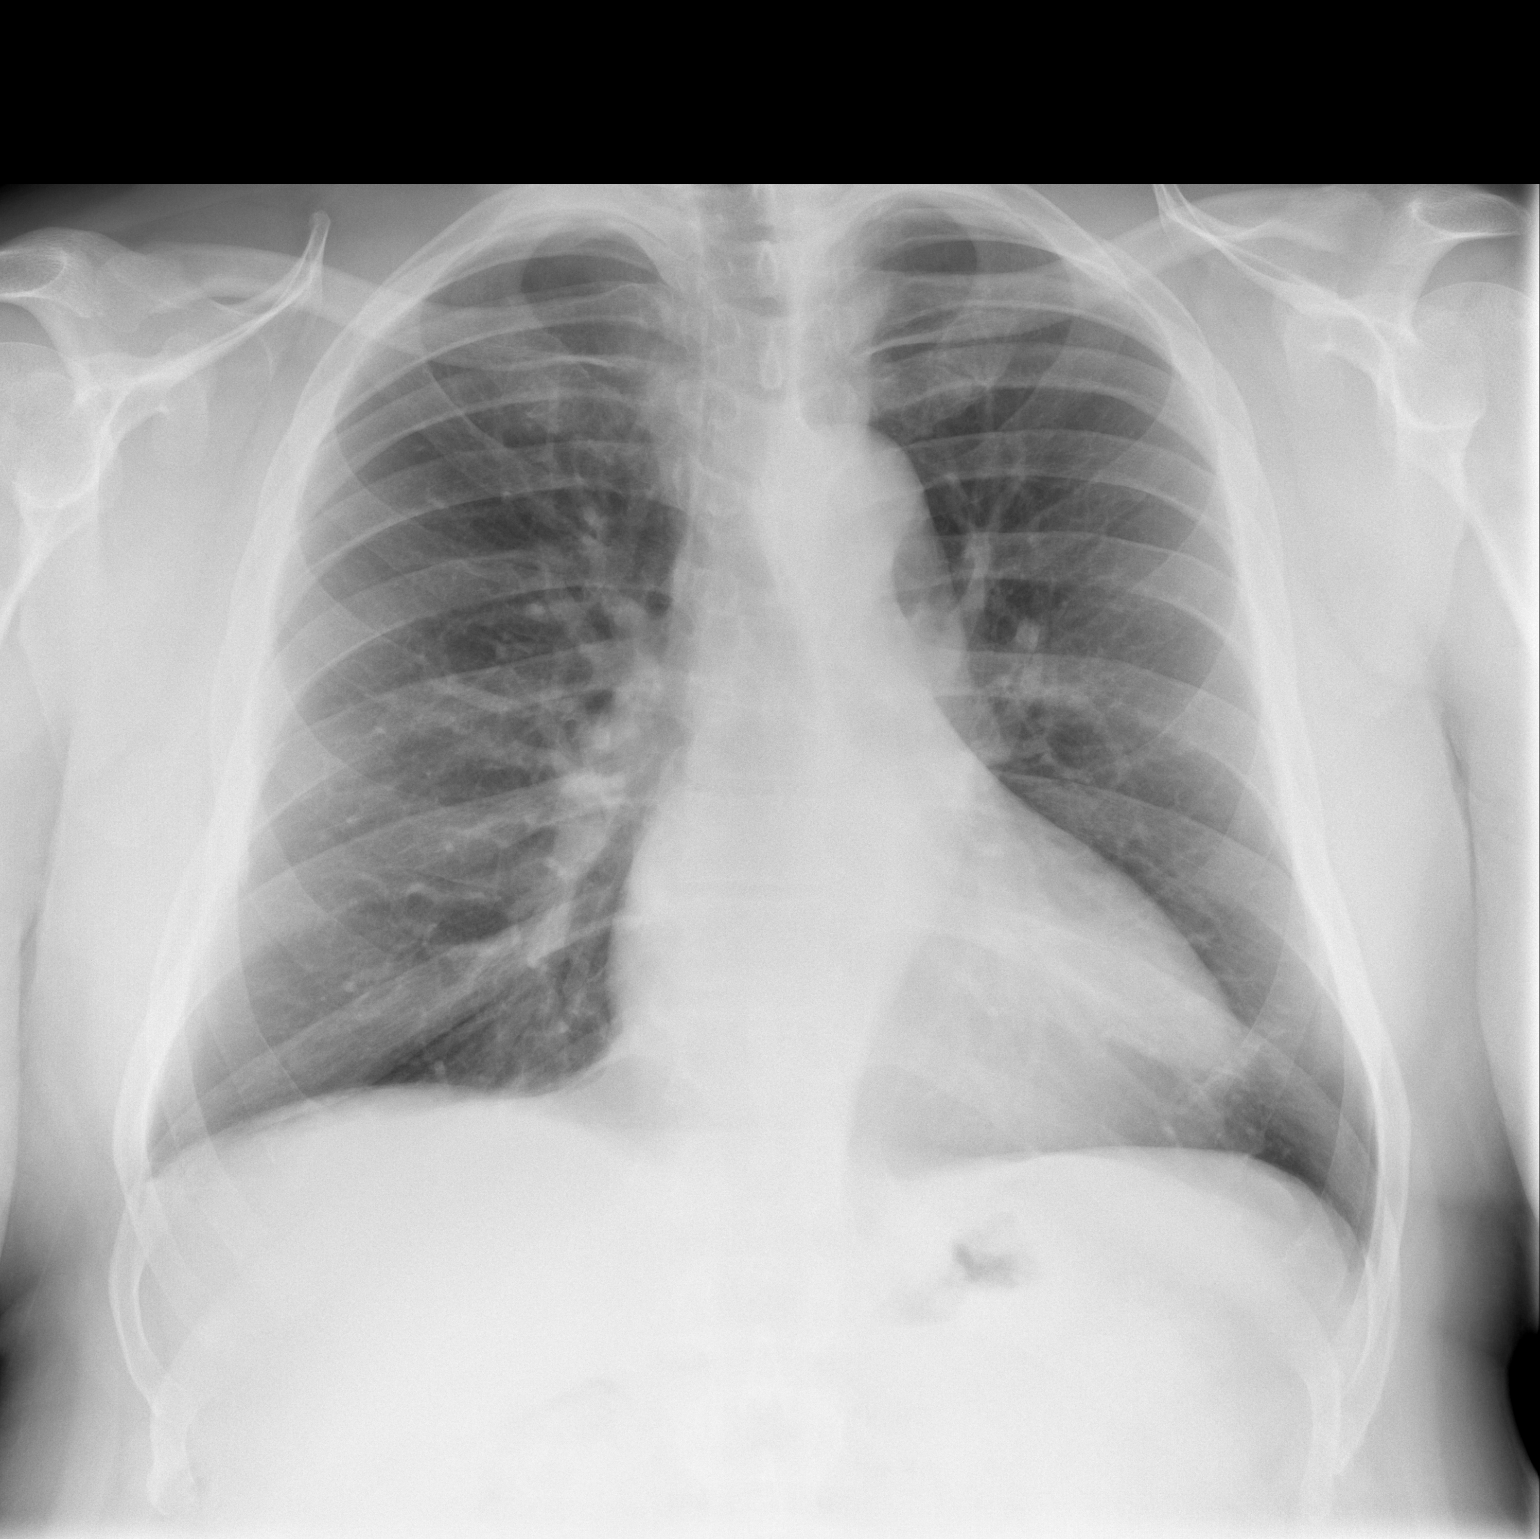

[w chest lat]
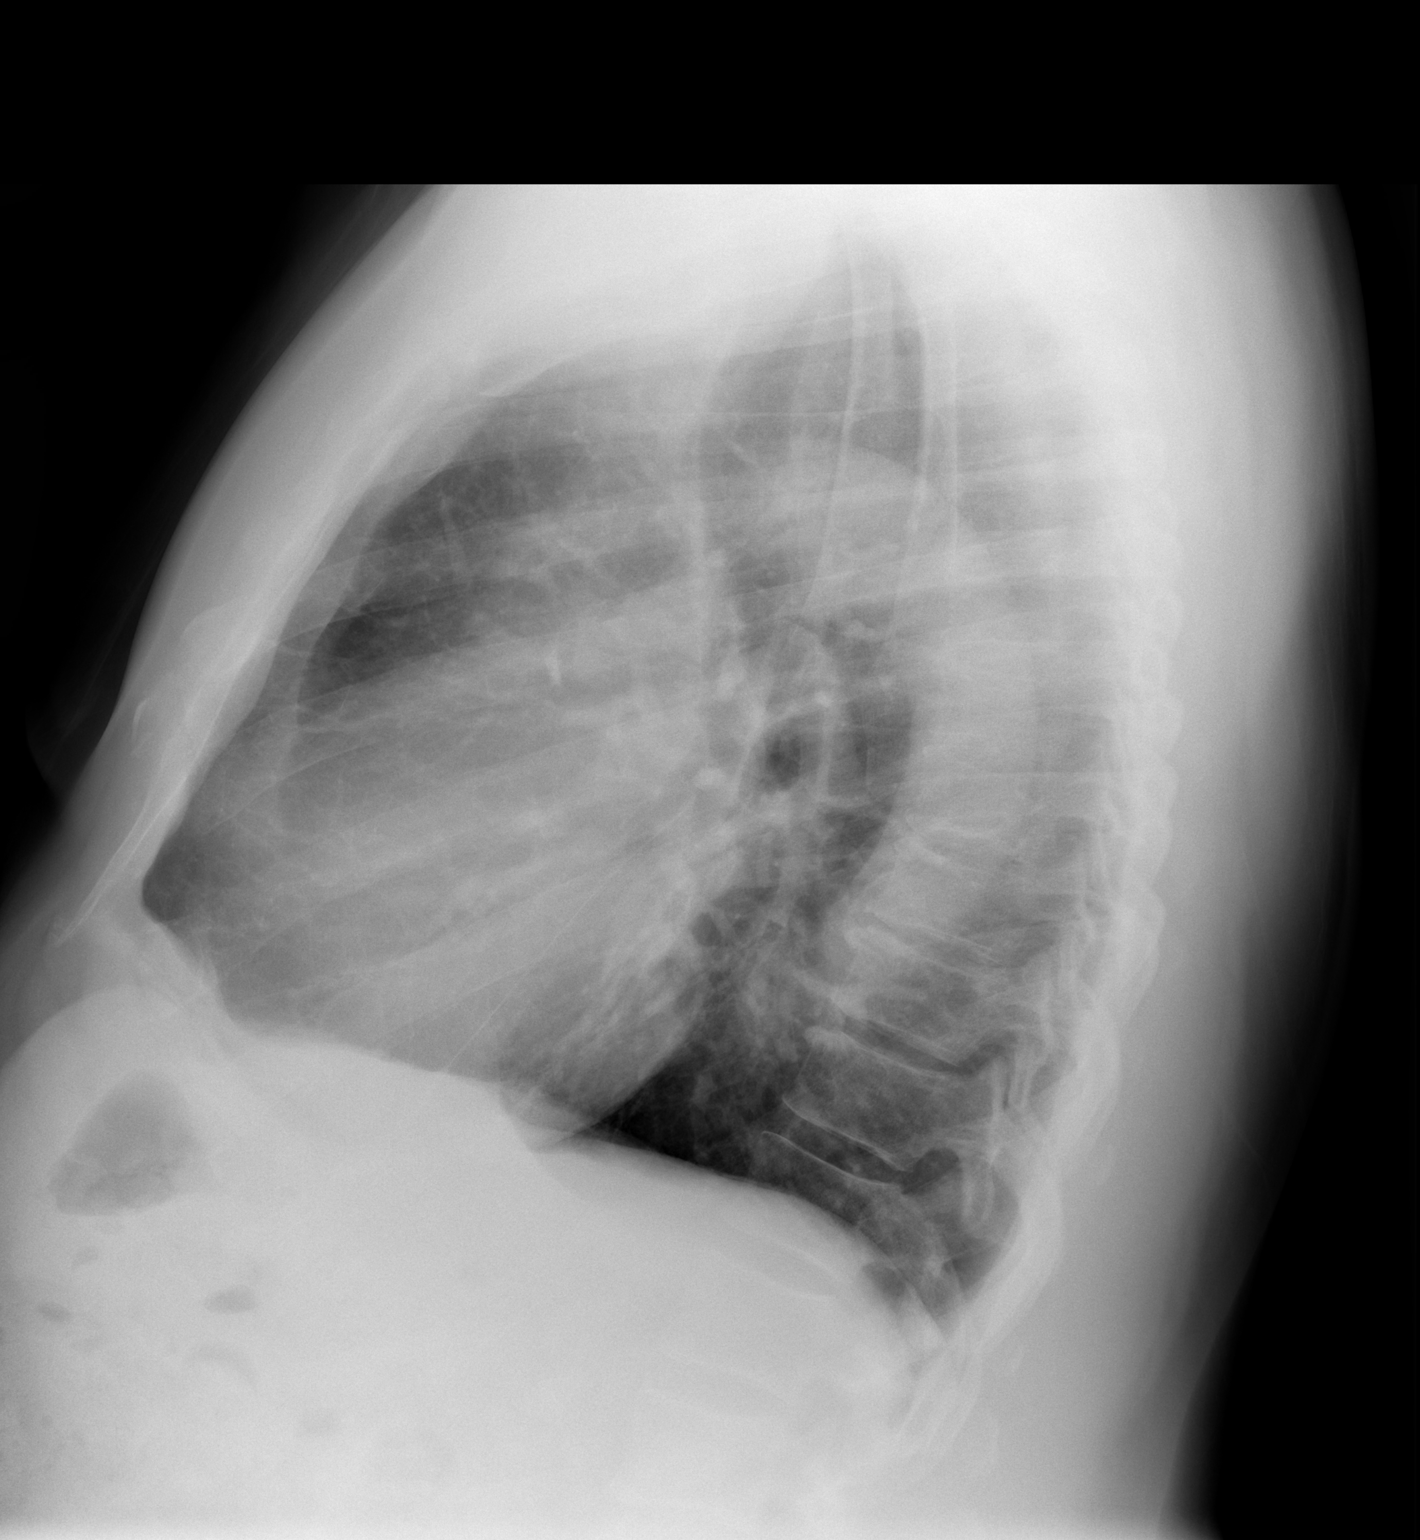

[2 of 2 positions shown; findings below may reference images not displayed]

FINDINGS: Heart size is normal.  The aorta is unfolded.  The right
lung is clear.  I think the left lung is now clear.  Mild
atelectasis questioned at the left base is not seen today.  No
effusions.  The vascularity is normal.  No objective bronchial
thickening.  Bony structures appear normal.
IMPRESSION: Chest radiographs within normal limits today.  No suspicion of left
lower lobe disease.

## 2011-05-17 NOTE — Assessment & Plan Note (Signed)
71 yr old male for follow up of asthma exacerbation.  Clinically improving.  Soft exp wheeze noted on left today.  Follow up chest x-ray today does not note any L sided infiltrate. Continue singulair, advair. He is instructed to keep his upcoming apt in May with Dr. Rodena Medin and contact us sooner if problems/concerns.

## 2011-05-17 NOTE — Progress Notes (Signed)
Addended by: Mervin Kung A on: 05/17/2011 01:48 PM   Modules accepted: Orders

## 2011-05-17 NOTE — Assessment & Plan Note (Addendum)
BP Readings from Last 3 Encounters:  05/17/11 148/90  05/07/11 156/70  05/04/11 175/112  Still above goal. Resume amlodipine 10 mg, continue bisoprolol. He is to let us know if he experiences side effects on the higher dose of amlodipine.

## 2011-05-17 NOTE — Progress Notes (Signed)
Subjective:    Patient ID: Brandon [name], male    DOB: Aug 16, 1940, 71 y.o.   MRN: [id number]  HPI  [name] is a 71 yr old male who presents today for follow up of his asthma exacerbation.  He was initially seen on 2/19 and treat with prednisone, ceftin for asthma/bronchitis. CXR at that time noted asymmetric atelectasis. He reports feeling "much better at this time. "  He is requesting zostavax today.  HTN- notes that he increased the amlodipine to 10mg  and noted that he had brief lightheadness after taking dose. Recently had a driving vacation and decided to go back on the 5mg  while on vacation. He plans to return to the 10mg  dose now that he is home.   Review of Systems See HPI  Past Medical History  Diagnosis Date  . Cardiomyopathy     with a negative cardiac catheterization in the past. (EF appriximately 40-45%)   . HTN (hypertension)     x 30 years  . Obstructive sleep apnea (adult) (pediatric)   . Chronic systolic heart failure   . 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 Obesity, unspecified   . Asthma     Hx of childhood asthma, disappeared for a while, then resurfaced 6-7 years ago.     History   Social History  . Marital Status: Married    Spouse Name: Brandon [name]    Number of Children: 3  . Years of Education: N/A   Occupational History  . retired    Social History Main Topics  . Smoking status: Former Games developer  . Smokeless tobacco: Never Used   Comment: quit smoling in 1990. started when 18, 1 ppd  . Alcohol Use: Yes     occasionaly   . Drug Use: Not on file  . Sexually Active: Not on file   Other Topics Concern  . Not on file   Social History Narrative   Grew up in [place], attended [employer]. First wife died of breast ca in 08/25/97. 3 children. Remarried- 8 years. 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 Pt signed designated party release granting access to [facility] to his wife Brandon [name]. Detailed message may be  left on home or cell phone. Roselle Locus August 13, 2009 11:34 am.     No past surgical history on file.  Family History  Problem Relation Age of Onset  . Cancer Father     multiple melanoma  . Lupus Sister   . Asthma Daughter     Allergies  Allergen Reactions  . Ace Inhibitors     REACTION: cough  . Bidil     REACTION: dizziness/hypotensive    Current Outpatient Prescriptions on File Prior to Visit  Medication Sig Dispense Refill  . albuterol (PROVENTIL HFA) 108 (90 BASE) MCG/ACT inhaler Inhale 2 puffs into the lungs every 6 (six) hours as needed.  1 Inhaler  11  . albuterol (PROVENTIL) (2.5 MG/3ML) 0.083% nebulizer solution Take 3 mLs (2.5 mg total) by nebulization every 6 (six) hours as needed for wheezing.  75 mL  1  . amLODipine (NORVASC) 10 MG tablet Take 1 tablet (10 mg total) by mouth daily.  30 tablet  0  . aspirin 81 MG tablet Take 81 mg by mouth daily.        . bisoprolol (ZEBETA) 10 MG tablet Take 1 tablet (10 mg total) by mouth daily.  30 tablet  11  . Fluticasone-Salmeterol (ADVAIR DISKUS) 250-50  MCG/DOSE AEPB Inhale 1 puff into the lungs 2 (two) times daily.  1 each  3  . losartan (COZAAR) 50 MG tablet Take 1 tablet (50 mg total) by mouth 2 (two) times daily.  60 tablet  11  . montelukast (SINGULAIR) 10 MG tablet Take 1 tablet (10 mg total) by mouth at bedtime.  30 each  6  . sertraline (ZOLOFT) 50 MG tablet Take 50 mg by mouth daily.      Marland Kitchen VIAGRA 50 MG tablet TAKE ONE TABLET BY MOUTH ONE HOUR PRIOR TO SEXUAL ACTIVITY  20 each  1  . zoster vaccine live, PF, (ZOSTAVAX) 16109 UNT/0.65ML injection Inject 0.65 mLs into the skin once. Administer vaccine x 1.           BP 148/90  Pulse 69  Temp(Src) 98 F (36.7 C) (Oral)  Resp 16  Wt 257 lb 1.3 oz (116.611 kg)  SpO2 96%       Objective:   Physical Exam  Constitutional: He appears well-developed and well-nourished.  Cardiovascular: Normal rate and regular rhythm.   Pulmonary/Chest: Effort normal. He has  no decreased breath sounds. He has wheezes in the left middle field and the left lower field. He has no rhonchi. He has no rales.  Musculoskeletal: He exhibits no edema.  Psychiatric: He has a normal mood and affect. His behavior is normal. Judgment and thought content normal.          Assessment & Plan:

## 2011-05-17 NOTE — Patient Instructions (Signed)
Please complete your chest xray on the first floor.   Keep your upcoming appointment in May. Call sooner if problems/concern.

## 2011-05-17 NOTE — Assessment & Plan Note (Deleted)
71 yr old male for follow up of asthma exacerbation.  Clinically improving.  Soft exp wheeze noted on left today.  Follow up chest x-ray today does not note any L sided infiltrate.  He is instructed to keep his upcoming apt in May with Dr. Rodena Medin and contact us sooner if problems/concerns.

## 2011-06-30 ENCOUNTER — Other Ambulatory Visit: Payer: Self-pay | Admitting: Family

## 2011-07-27 ENCOUNTER — Ambulatory Visit: Payer: Medicare Other | Admitting: Internal Medicine

## 2011-08-03 ENCOUNTER — Ambulatory Visit (INDEPENDENT_AMBULATORY_CARE_PROVIDER_SITE_OTHER): Payer: Medicare Other | Admitting: Internal Medicine

## 2011-08-03 ENCOUNTER — Encounter: Payer: Self-pay | Admitting: Internal Medicine

## 2011-08-03 VITALS — BP 112/80 | HR 52 | Temp 98.2°F | Resp 18 | Wt 260.0 lb

## 2011-08-03 DIAGNOSIS — N259 Disorder resulting from impaired renal tubular function, unspecified: Secondary | ICD-10-CM

## 2011-08-03 DIAGNOSIS — N529 Male erectile dysfunction, unspecified: Secondary | ICD-10-CM

## 2011-08-03 DIAGNOSIS — E669 Obesity, unspecified: Secondary | ICD-10-CM

## 2011-08-03 DIAGNOSIS — I428 Other cardiomyopathies: Secondary | ICD-10-CM

## 2011-08-03 DIAGNOSIS — I10 Essential (primary) hypertension: Secondary | ICD-10-CM

## 2011-08-03 LAB — BASIC METABOLIC PANEL
BUN: 21 mg/dL (ref 6–23)
CO2: 25 mEq/L (ref 19–32)
Glucose, Bld: 94 mg/dL (ref 70–99)
Potassium: 4.6 mEq/L (ref 3.5–5.3)
Sodium: 139 mEq/L (ref 135–145)

## 2011-08-03 LAB — CBC
HCT: 39.8 % (ref 39.0–52.0)
Hemoglobin: 13.6 g/dL (ref 13.0–17.0)
MCH: 31.3 pg (ref 26.0–34.0)
MCHC: 34.2 g/dL (ref 30.0–36.0)
RBC: 4.34 MIL/uL (ref 4.22–5.81)

## 2011-08-03 MED ORDER — LOSARTAN POTASSIUM 50 MG PO TABS: 50.0000 mg | ORAL_TABLET | Freq: Two times a day (BID) | ORAL | Status: DC

## 2011-08-03 MED ORDER — SILDENAFIL CITRATE 100 MG PO TABS: 100.0000 mg | ORAL_TABLET | Freq: Every day | ORAL | Status: DC | PRN

## 2011-08-03 MED ORDER — AMLODIPINE BESYLATE 10 MG PO TABS: 10.0000 mg | ORAL_TABLET | Freq: Every day | ORAL | Status: DC

## 2011-08-03 MED ORDER — SERTRALINE HCL 50 MG PO TABS: 50.0000 mg | ORAL_TABLET | Freq: Every day | ORAL | Status: DC

## 2011-08-03 MED ORDER — BISOPROLOL FUMARATE 10 MG PO TABS: 10.0000 mg | ORAL_TABLET | Freq: Every day | ORAL | Status: DC

## 2011-08-03 NOTE — Progress Notes (Signed)
  Subjective:    Patient ID: Brandon [name], male    DOB: Jan 16, 1941, 71 y.o.   MRN: [id number]  HPI Pt presents to clinic for followup of multiple medical problems. Notes improvement with viagra but suboptimal control of ED with current dose. BP reviewed normotensive. No other complaints.  Past Medical History  Diagnosis Date  . Cardiomyopathy     with a negative cardiac catheterization in the past. (EF appriximately 40-45%)   . HTN (hypertension)     x 30 years  . Obstructive sleep apnea (adult) (pediatric)   . Chronic systolic heart failure   . 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 Obesity, unspecified   . Asthma     Hx of childhood asthma, disappeared for a while, then resurfaced 6-7 years ago.    No past surgical history on file.  reports that he has quit smoking. He has never used smokeless tobacco. He reports that he drinks alcohol. His drug history not on file. family history includes Asthma in his daughter; Cancer in his father; and Lupus in his sister. Allergies  Allergen Reactions  . Ace Inhibitors     REACTION: cough  . Isosorb Dinitrate-Hydralazine     REACTION: dizziness/hypotensive      Review of Systems see hpi     Objective:   Physical Exam  Physical Exam  Nursing note and vitals reviewed. Constitutional: Appears well-developed and well-nourished. No distress.  HENT:  Head: Normocephalic and atraumatic.  Right Ear: External ear normal.  Left Ear: External ear normal.  Eyes: Conjunctivae are normal. No scleral icterus.  Neck: Neck supple. Carotid bruit is not present.  Cardiovascular: Normal rate, regular rhythm and normal heart sounds.  Exam reveals no gallop and no friction rub.   No murmur heard. Pulmonary/Chest: Effort normal and breath sounds normal. No respiratory distress. He has no wheezes. no rales.  Lymphadenopathy:    He has no cervical adenopathy.  Neurological:Alert.  Skin: Skin is warm and dry. Not diaphoretic.   Psychiatric: Has a normal mood and affect.        Assessment & Plan:

## 2011-08-04 ENCOUNTER — Telehealth: Payer: Self-pay | Admitting: Family

## 2011-08-04 LAB — URINALYSIS, ROUTINE W REFLEX MICROSCOPIC
Glucose, UA: NEGATIVE mg/dL
Hgb urine dipstick: NEGATIVE
Ketones, ur: NEGATIVE mg/dL
Protein, ur: NEGATIVE mg/dL
pH: 6 (ref 5.0–8.0)

## 2011-08-04 MED ORDER — MONTELUKAST SODIUM 10 MG PO TABS: 10.0000 mg | ORAL_TABLET | Freq: Every day | ORAL | Status: DC

## 2011-08-04 NOTE — Telephone Encounter (Signed)
Refill- montelukast 10mg  tab. Take one tablet by mouth at bedtime. Qty 30 last fill 4.17.13

## 2011-08-14 DIAGNOSIS — N529 Male erectile dysfunction, unspecified: Secondary | ICD-10-CM | POA: Insufficient documentation

## 2011-08-14 NOTE — Assessment & Plan Note (Signed)
Noted weight gain. Encouraged wt loss

## 2011-08-14 NOTE — Assessment & Plan Note (Signed)
Normotensive and stable. Continue current regimen. Monitor bp as outpt and followup in clinic as scheduled. Obtain cbc, chem7, ua.

## 2011-08-14 NOTE — Assessment & Plan Note (Signed)
Increase viagra dose

## 2011-09-30 ENCOUNTER — Telehealth: Payer: Self-pay | Admitting: Family

## 2011-09-30 NOTE — Telephone Encounter (Signed)
Notified pt that it looks like this was an old note that was attached to the medication previously and has not been removed. Apologized to pt and advised pt note will probably be on each refill that he picks up until current rx expires. Pt voices understanding.

## 2011-09-30 NOTE — Telephone Encounter (Signed)
bisoprolfum 10 mg   He picked up his refill and it said he needed to make an appointment for a six month follow up.  He was here in May and is scheduled for  September.  Was this note an error

## 2011-12-03 ENCOUNTER — Ambulatory Visit (INDEPENDENT_AMBULATORY_CARE_PROVIDER_SITE_OTHER): Payer: Medicare Other | Admitting: Family

## 2011-12-03 ENCOUNTER — Ambulatory Visit (HOSPITAL_BASED_OUTPATIENT_CLINIC_OR_DEPARTMENT_OTHER)
Admission: RE | Admit: 2011-12-03 | Discharge: 2011-12-03 | Disposition: A | Discharge status: Home / Self Care | Payer: Medicare Other | Source: Ambulatory Visit | Attending: Family | Admitting: Family

## 2011-12-03 ENCOUNTER — Encounter: Payer: Self-pay | Admitting: Family

## 2011-12-03 VITALS — BP 140/86 | HR 44 | Temp 98.5°F | Resp 16

## 2011-12-03 DIAGNOSIS — I10 Essential (primary) hypertension: Secondary | ICD-10-CM | POA: Insufficient documentation

## 2011-12-03 DIAGNOSIS — M7989 Other specified soft tissue disorders: Secondary | ICD-10-CM | POA: Insufficient documentation

## 2011-12-03 DIAGNOSIS — Z23 Encounter for immunization: Secondary | ICD-10-CM

## 2011-12-03 DIAGNOSIS — R609 Edema, unspecified: Secondary | ICD-10-CM

## 2011-12-03 IMAGING — US US EXTREM LOW VENOUS*R*
1 series · 14 of 24 positions shown · non-contrast
Comparison: None

CLINICAL DATA: Right leg swelling for 1 month, history
hypertension, asthma, cardiomyopathy

RIGHT LOWER EXTREMITY VENOUS DUPLEX ULTRASOUND
TECHNIQUE: Gray-scale sonography with graded compression, as well
as color Doppler and duplex ultrasound, were performed to evaluate
the deep venous system of the lower extremity from the level of the
common femoral vein through the popliteal and proximal calf veins.
Spectral Doppler was utilized to evaluate flow at rest and with
distal augmentation maneuvers.

[Series 1: us extrem low venous*right* · 14 of 26 slices shown]
[im 1/26]
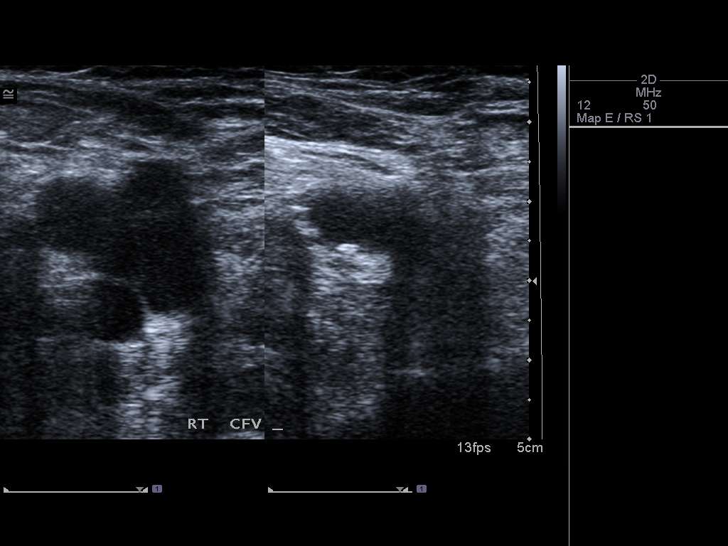
[im 3/26]
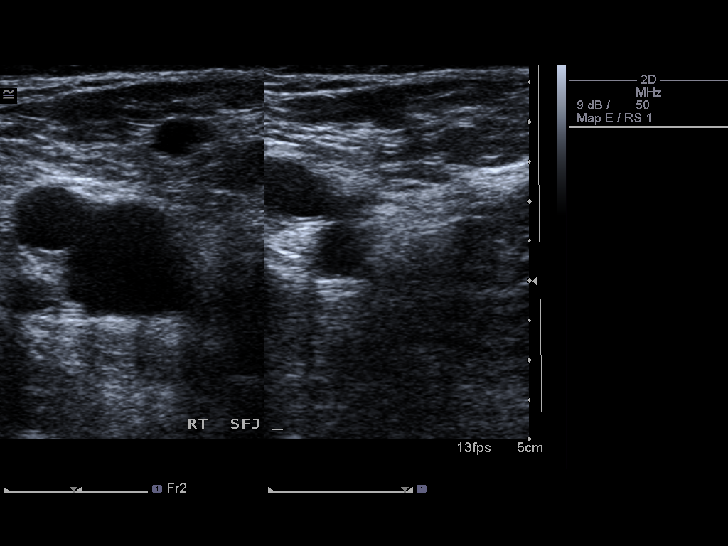
[im 5/26]
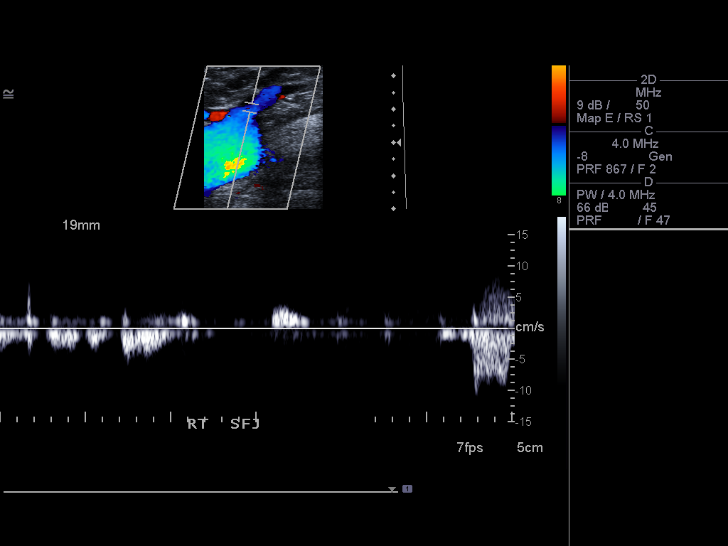
[im 7/26]
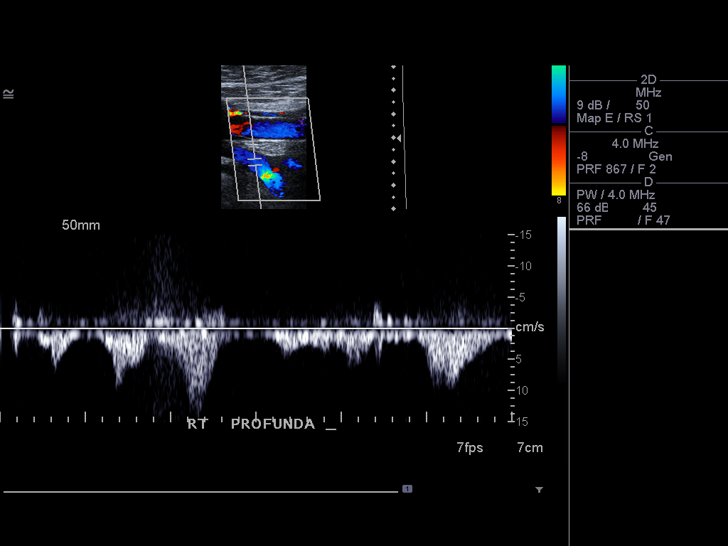
[im 8/26]
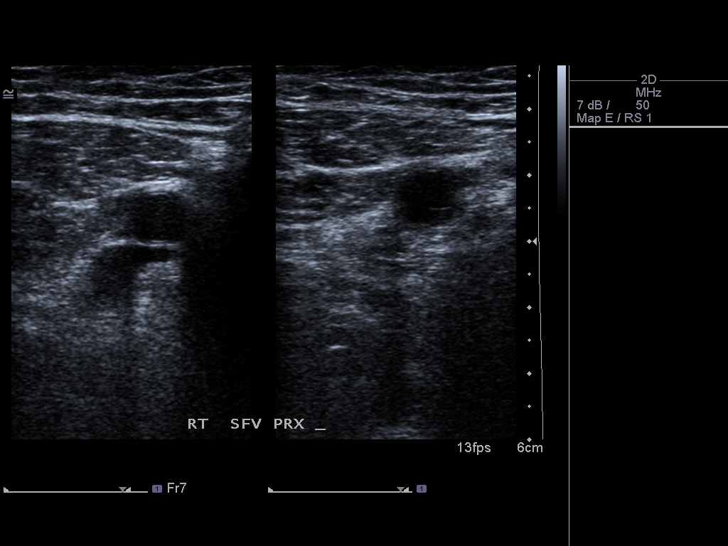
[im 10/26]
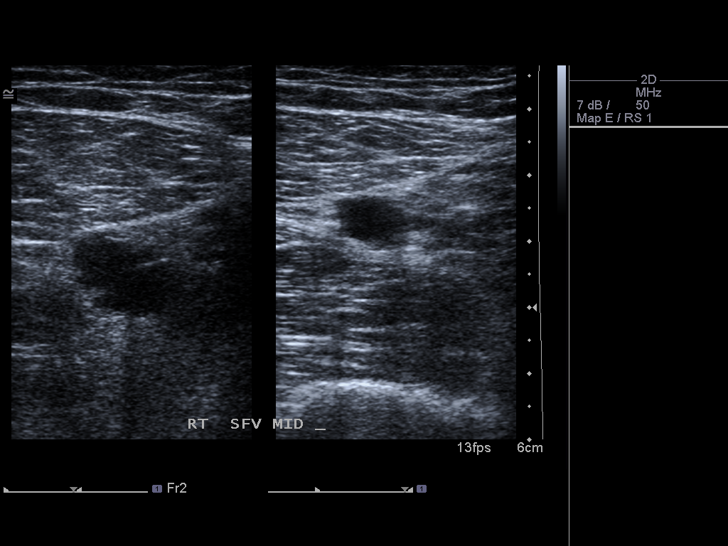
[im 12/26]
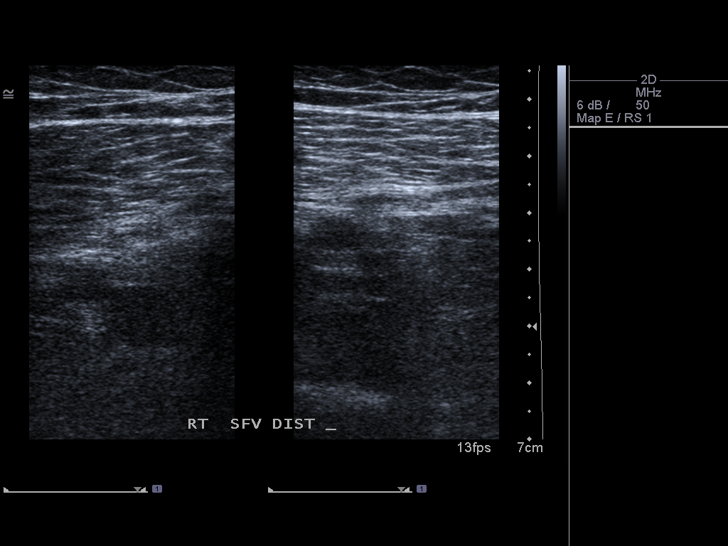
[im 14/26]
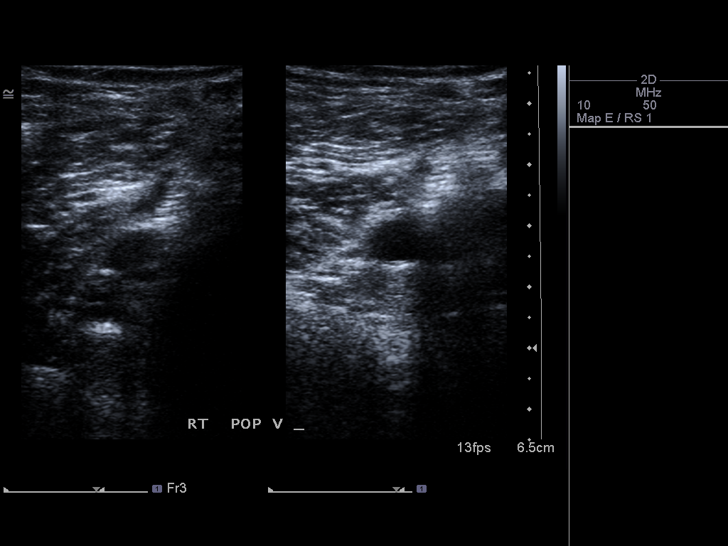
[im 16/26]
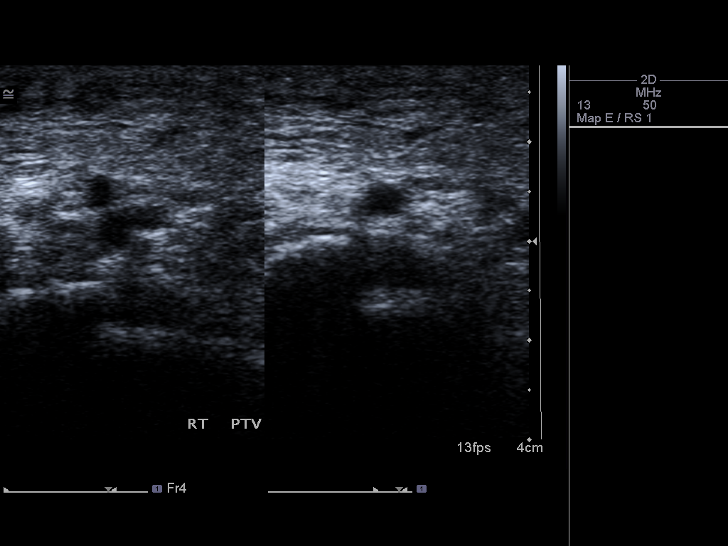
[im 18/26]
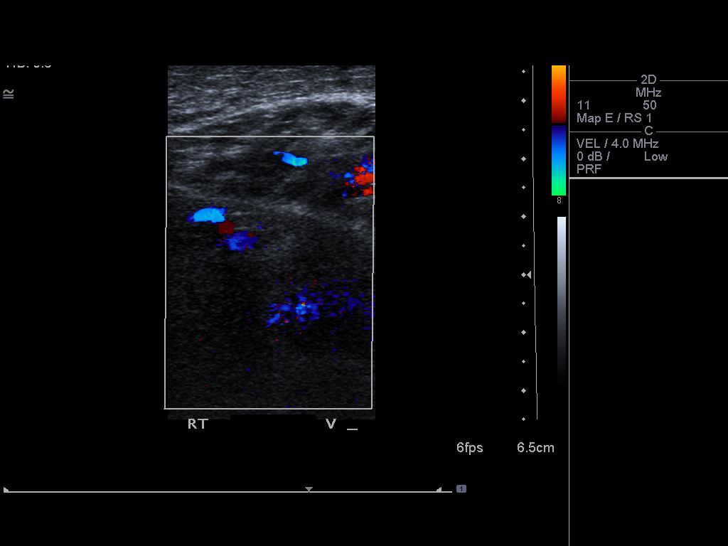
[im 20/26]
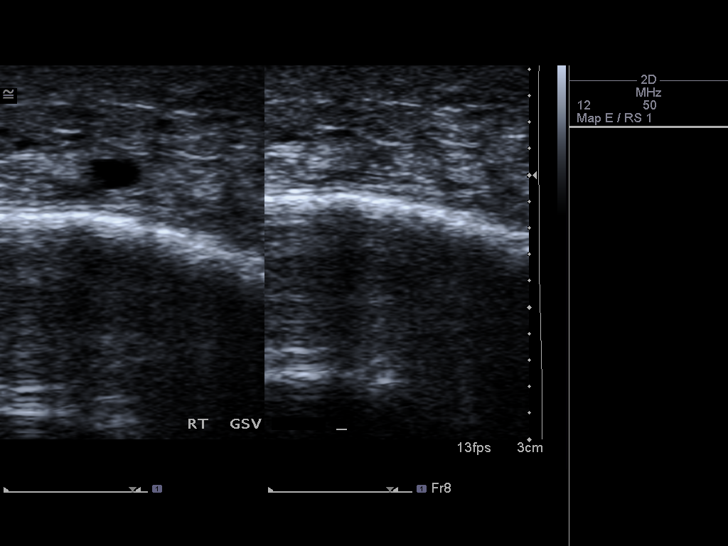
[im 21/26]
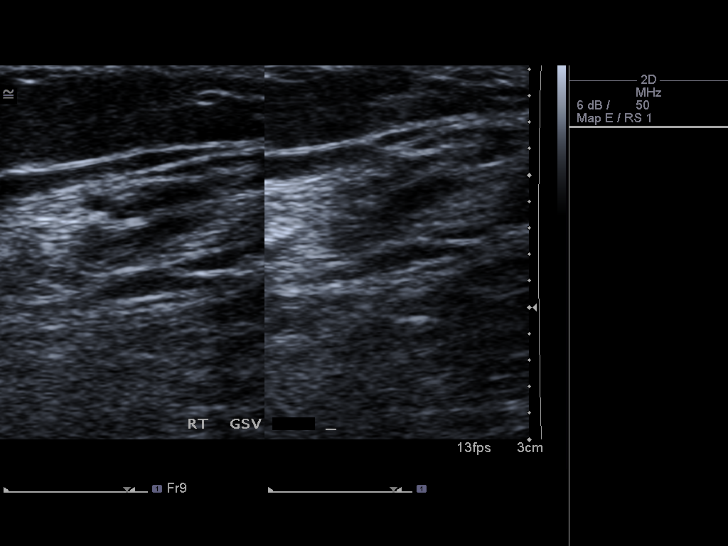
[im 23/26]
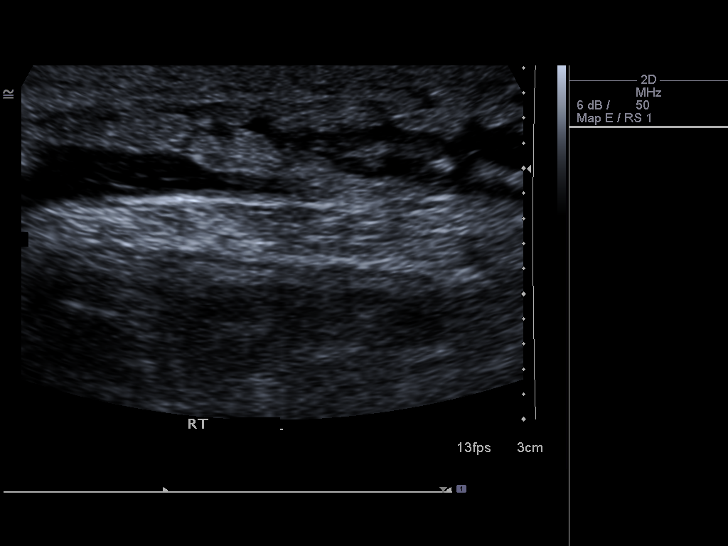
[im 26/26]
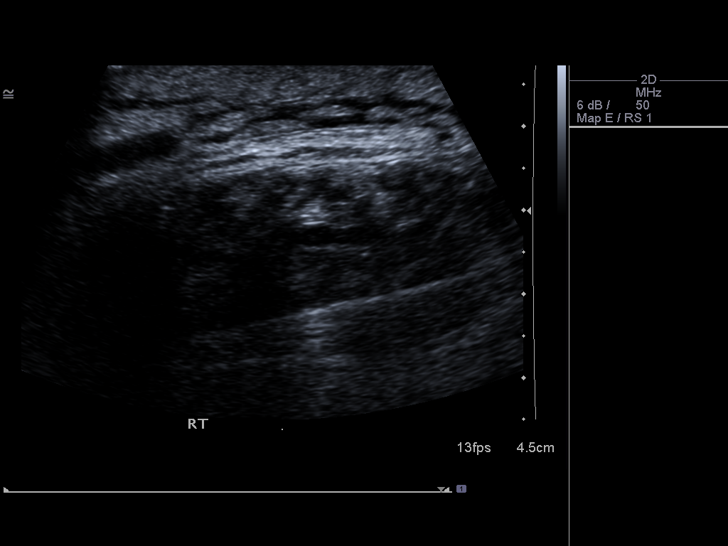

[14 of 24 positions shown; findings below may reference images not displayed]

FINDINGS: Deep venous system patent and compressible from right groin through
popliteal fossa.
Spontaneous venous flow present with intact augmentation and
evidence of respiratory phasicity.
No intraluminal thrombus identified.
Visualized portion of the greater saphenous system unremarkable.
Subcutaneous edema identified at right calf.
IMPRESSION: No evidence of deep venous thrombosis in the right lower extremity.

## 2011-12-03 MED ORDER — FUROSEMIDE 20 MG PO TABS: 20.0000 mg | ORAL_TABLET | Freq: Every day | ORAL | Status: DC

## 2011-12-03 NOTE — Progress Notes (Signed)
Subjective:    Patient ID: Brandon [name], male    DOB: [birth year], 71 y.o.   MRN: [id number]  HPI  [name] is a 71 yr old male who presents today with chief complaint of LE edema.   Bilateral LE edema x 2 weeks.  He denies associated cp or shortness of breath.  Some associated soreness right calf.      Review of Systems See HPI  Past Medical History  Diagnosis Date  . Cardiomyopathy     with a negative cardiac catheterization in the past. (EF appriximately 40-45%)   . HTN (hypertension)     x 30 years  . Obstructive sleep apnea (adult) (pediatric)   . Chronic systolic heart failure   . 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 Obesity, unspecified   . Asthma     Hx of childhood asthma, disappeared for a while, then resurfaced 6-7 years ago.     History   Social History  . Marital Status: Married    Spouse Name: [name]    Number of Children: 3  . Years of Education: N/A   Occupational History  . retired    Social History Main Topics  . Smoking status: Former Games developer  . Smokeless tobacco: Never Used   Comment: quit smoling in 1990. started when 18, 1 ppd  . Alcohol Use: Yes     occasionaly   . Drug Use: Not on file  . Sexually Active: Not on file   Other Topics Concern  . Not on file   Social History Narrative   Grew up in [place], attended [employer]. First wife died of breast ca in August 17, 1997. 3 children. Remarried- 8 years. 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 Pt signed designated party release granting access to [facility] to his wife [name]. Detailed message may be left on home or cell phone. Roselle Locus August 13, 2009 11:34 am.     No past surgical history on file.  Family History  Problem Relation Age of Onset  . Cancer Father     multiple melanoma  . Lupus Sister   . Asthma Daughter     Allergies  Allergen Reactions  . Ace Inhibitors     REACTION: cough  . Isosorb Dinitrate-Hydralazine    REACTION: dizziness/hypotensive    Current Outpatient Prescriptions on File Prior to Visit  Medication Sig Dispense Refill  . albuterol (PROVENTIL HFA) 108 (90 BASE) MCG/ACT inhaler Inhale 2 puffs into the lungs every 6 (six) hours as needed.  1 Inhaler  11  . albuterol (PROVENTIL) (2.5 MG/3ML) 0.083% nebulizer solution Take 3 mLs (2.5 mg total) by nebulization every 6 (six) hours as needed for wheezing.  75 mL  1  . amLODipine (NORVASC) 10 MG tablet Take 5 mg by mouth daily.      Marland Kitchen aspirin 81 MG tablet Take 81 mg by mouth daily.        . bisoprolol (ZEBETA) 10 MG tablet Take 1 tablet (10 mg total) by mouth daily.  30 tablet  11  . Fluticasone-Salmeterol (ADVAIR DISKUS) 250-50 MCG/DOSE AEPB Inhale 1 puff into the lungs 2 (two) times daily.  1 each  3  . losartan (COZAAR) 50 MG tablet Take 1 tablet (50 mg total) by mouth 2 (two) times daily.  60 tablet  11  . montelukast (SINGULAIR) 10 MG tablet Take 1 tablet (10 mg total) by mouth at bedtime.  30 tablet  4  . sertraline (  ZOLOFT) 50 MG tablet Take 1 tablet (50 mg total) by mouth daily.  30 tablet  6  . sildenafil (VIAGRA) 100 MG tablet Take 1 tablet (100 mg total) by mouth daily as needed for erectile dysfunction.  6 tablet  4  . furosemide (LASIX) 20 MG tablet Take 1 tablet (20 mg total) by mouth daily.  30 tablet  0    BP 140/86  Pulse 44  Temp 98.5 F (36.9 C) (Oral)  Resp 16  SpO2 98%       Objective:   Physical Exam  Constitutional: He appears well-developed and well-nourished. No distress.  HENT:  Head: Normocephalic and atraumatic.  Cardiovascular: Normal rate and regular rhythm.   No murmur heard. Pulmonary/Chest: Effort normal and breath sounds normal. No respiratory distress. He has no wheezes. He has no rales. He exhibits no tenderness.  Musculoskeletal:       3+ RLE edema on right.   2+ LLE edema.  Skin: Skin is warm and dry. No erythema.  Psychiatric: He has a normal mood and affect. His behavior is normal. Judgment  and thought content normal.          Assessment & Plan:

## 2011-12-03 NOTE — Patient Instructions (Addendum)
Start furosemide. Cut amlodipine (norvasc) in half and take 5mg  once daily. Follow up in 1 week. Complete your ultrasound this morning on the first floor. Call if you develop worsening swelling, or shortness of breath.

## 2011-12-06 DIAGNOSIS — R609 Edema, unspecified: Secondary | ICD-10-CM | POA: Insufficient documentation

## 2011-12-06 NOTE — Assessment & Plan Note (Signed)
BP Readings from Last 3 Encounters:  12/03/11 140/86  08/03/11 112/80  05/17/11 148/90   Amlodipine may be contributing to pt's edema.  BP ok today.  I advised pt to cut amlodipine in half and take 5 mg once daily and to resume the losartan.

## 2011-12-06 NOTE — Assessment & Plan Note (Signed)
R>L with some right calf discomfort.  RLE doppler obtained and negative for DVT.  Start furosemide 20mg  once daily for swelling.

## 2011-12-07 ENCOUNTER — Ambulatory Visit: Payer: Medicare Other | Admitting: Internal Medicine

## 2011-12-07 DIAGNOSIS — Z0289 Encounter for other administrative examinations: Secondary | ICD-10-CM

## 2011-12-22 ENCOUNTER — Telehealth: Payer: Self-pay | Admitting: Family

## 2011-12-22 MED ORDER — ALBUTEROL SULFATE HFA 108 (90 BASE) MCG/ACT IN AERS: 2.0000 | INHALATION_SPRAY | Freq: Four times a day (QID) | RESPIRATORY_TRACT | Status: DC | PRN

## 2011-12-22 NOTE — Telephone Encounter (Signed)
Refill sent to pharmacy, detailed message left on home # re: completion and to call if any questions.

## 2011-12-24 ENCOUNTER — Ambulatory Visit (INDEPENDENT_AMBULATORY_CARE_PROVIDER_SITE_OTHER): Payer: Medicare Other | Admitting: Family

## 2011-12-24 ENCOUNTER — Encounter: Payer: Self-pay | Admitting: Family

## 2011-12-24 VITALS — BP 136/78 | HR 52 | Temp 98.5°F | Resp 14 | Wt 269.0 lb

## 2011-12-24 DIAGNOSIS — Z5181 Encounter for therapeutic drug level monitoring: Secondary | ICD-10-CM

## 2011-12-24 DIAGNOSIS — R609 Edema, unspecified: Secondary | ICD-10-CM

## 2011-12-24 MED ORDER — FUROSEMIDE 20 MG PO TABS: 20.0000 mg | ORAL_TABLET | Freq: Every day | ORAL | Status: DC

## 2011-12-24 NOTE — Assessment & Plan Note (Addendum)
Pt feels that this is about the same.  His lung exam is normal.  Plan to continue current dose of furosemide, obtain BMET to assess K+, will arrange follow up with Dr. Antoine Poche as he has not seen him in >1 year. Also recommended that he try using otc compression stockings during the day to help with swelling.

## 2011-12-24 NOTE — Patient Instructions (Addendum)
You will be contacted about your appointment with Dr. Antoine Poche. Please complete your lab work prior to leaving.  Please purchase knee high compression stockings to wear during the day to help with your swelling.  Follow up in 3 months.

## 2011-12-24 NOTE — Progress Notes (Signed)
Subjective:    Patient ID: Brandon [name], male    DOB: Feb 10, 1941, 71 y.o.   MRN: [id number]  HPI  [name] is a 71 yr old male who presents today for follow up of his lower extremity edema.  He had a neg LE doppler last visit and  his amlodipine dose was decreased from 10mg  to 5 mg.  Losartan was resumed and he was placed on furosemide 20mg  once daily. He reports that his LE edema is unchanged. Notes edema is better in the morning and worsens as the day wears on.  He is tolerating furosemide without difficulty.    Review of Systems    see HPI  Past Medical History  Diagnosis Date  . Cardiomyopathy     with a negative cardiac catheterization in the past. (EF appriximately 40-45%)   . HTN (hypertension)     x 30 years  . Obstructive sleep apnea (adult) (pediatric)   . Chronic systolic heart failure   . 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 Obesity, unspecified   . Asthma     Hx of childhood asthma, disappeared for a while, then resurfaced 6-7 years ago.     History   Social History  . Marital Status: Married    Spouse Name: [name]    Number of Children: 3  . Years of Education: N/A   Occupational History  . retired    Social History Main Topics  . Smoking status: Former Games developer  . Smokeless tobacco: Never Used   Comment: quit smoling in 1990. started when 18, 1 ppd  . Alcohol Use: Yes     occasionaly   . Drug Use: Not on file  . Sexually Active: Not on file   Other Topics Concern  . Not on file   Social History Narrative   Grew up in [place], attended [employer]. First wife died of breast ca in Aug 24, 1997. 3 children. Remarried- 8 years. 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 Pt signed designated party release granting access to [facility] to his wife [name]. Detailed message may be left on home or cell phone. Roselle Locus August 13, 2009 11:34 am.     No past surgical history on file.  Family History  Problem  Relation Age of Onset  . Cancer Father     multiple melanoma  . Lupus Sister   . Asthma Daughter     Allergies  Allergen Reactions  . Ace Inhibitors     REACTION: cough  . Isosorb Dinitrate-Hydralazine     REACTION: dizziness/hypotensive    Current Outpatient Prescriptions on File Prior to Visit  Medication Sig Dispense Refill  . albuterol (PROVENTIL HFA) 108 (90 BASE) MCG/ACT inhaler Inhale 2 puffs into the lungs every 6 (six) hours as needed.  1 Inhaler  3  . albuterol (PROVENTIL) (2.5 MG/3ML) 0.083% nebulizer solution Take 3 mLs (2.5 mg total) by nebulization every 6 (six) hours as needed for wheezing.  75 mL  1  . amLODipine (NORVASC) 10 MG tablet Take 5 mg by mouth daily.      Marland Kitchen aspirin 81 MG tablet Take 81 mg by mouth daily.        . bisoprolol (ZEBETA) 10 MG tablet Take 1 tablet (10 mg total) by mouth daily.  30 tablet  11  . Fluticasone-Salmeterol (ADVAIR DISKUS) 250-50 MCG/DOSE AEPB Inhale 1 puff into the lungs 2 (two) times daily.  1 each  3  . losartan (  COZAAR) 50 MG tablet Take 1 tablet (50 mg total) by mouth 2 (two) times daily.  60 tablet  11  . montelukast (SINGULAIR) 10 MG tablet Take 1 tablet (10 mg total) by mouth at bedtime.  30 tablet  4  . sertraline (ZOLOFT) 50 MG tablet Take 1 tablet (50 mg total) by mouth daily.  30 tablet  6  . sildenafil (VIAGRA) 100 MG tablet Take 1 tablet (100 mg total) by mouth daily as needed for erectile dysfunction.  6 tablet  4  . DISCONTD: furosemide (LASIX) 20 MG tablet Take 1 tablet (20 mg total) by mouth daily.  30 tablet  0    BP 136/78  Pulse 52  Temp 98.5 F (36.9 C) (Oral)  Resp 14  Wt 269 lb (122.018 kg)  SpO2 96%    Objective:   Physical Exam  Constitutional: He is oriented to person, place, and time. He appears well-developed and well-nourished. No distress.  Cardiovascular: Normal rate and regular rhythm.   No murmur heard. Pulmonary/Chest: Effort normal and breath sounds normal. No respiratory distress. He has  no wheezes. He has no rales. He exhibits no tenderness.  Musculoskeletal:       3+ bilateral LE edema  Neurological: He is alert and oriented to person, place, and time.  Psychiatric: He has a normal mood and affect. His behavior is normal. Judgment and thought content normal.          Assessment & Plan:

## 2011-12-25 LAB — BASIC METABOLIC PANEL
BUN: 16 mg/dL (ref 6–23)
Calcium: 9.6 mg/dL (ref 8.4–10.5)
Potassium: 4.7 mEq/L (ref 3.5–5.3)

## 2011-12-26 ENCOUNTER — Encounter: Payer: Self-pay | Admitting: Family

## 2011-12-27 ENCOUNTER — Encounter: Payer: Self-pay | Admitting: Cardiology

## 2011-12-27 ENCOUNTER — Ambulatory Visit (INDEPENDENT_AMBULATORY_CARE_PROVIDER_SITE_OTHER): Payer: Medicare Other | Admitting: Cardiology

## 2011-12-27 VITALS — BP 165/90 | HR 54 | Ht 72.0 in | Wt 270.6 lb

## 2011-12-27 DIAGNOSIS — R0602 Shortness of breath: Secondary | ICD-10-CM

## 2011-12-27 DIAGNOSIS — R609 Edema, unspecified: Secondary | ICD-10-CM

## 2011-12-27 DIAGNOSIS — I428 Other cardiomyopathies: Secondary | ICD-10-CM

## 2011-12-27 DIAGNOSIS — I10 Essential (primary) hypertension: Secondary | ICD-10-CM

## 2011-12-27 DIAGNOSIS — E669 Obesity, unspecified: Secondary | ICD-10-CM

## 2011-12-27 LAB — BRAIN NATRIURETIC PEPTIDE: Pro B Natriuretic peptide (BNP): 215 pg/mL — ABNORMAL HIGH (ref 0.0–100.0)

## 2011-12-27 LAB — TSH: TSH: 1.84 u[IU]/mL (ref 0.35–5.50)

## 2011-12-27 NOTE — Patient Instructions (Addendum)
The current medical regimen is effective;  continue present plan and medications.  Please have blood work today (TSH, BNP)  Your physician has requested that you have an echocardiogram. Echocardiography is a painless test that uses sound waves to create images of your heart. It provides your doctor with information about the size and shape of your heart and how well your heart's chambers and valves are working. This procedure takes approximately one hour. There are no restrictions for this procedure.  Follow up in 2 months with Dr Antoine Poche

## 2011-12-27 NOTE — Progress Notes (Signed)
HPI The patient presents for followup of known cardiomyopathy.  I last saw him in July of last year and I reviewed an echocardiogram. 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 However, since I last saw him he has had some increasing dyspnea with activities. He is using his CPAP at night which he wasn't doing routinely before. He's not describing PND or orthopnea. He has had active management of his hypertension with increased amlodipine about 6 months ago. However, this was reduced back down to 5 mg when he had increased lower extremity edema. The edema has persisted however. He's not exercising. It late at night. He reports that his systolic blood pressures are typically around 140.  Allergies  Allergen Reactions  . Ace Inhibitors     REACTION: cough  . Isosorb Dinitrate-Hydralazine     REACTION: dizziness/hypotensive    Current Outpatient Prescriptions  Medication Sig Dispense Refill  . albuterol (PROVENTIL HFA) 108 (90 BASE) MCG/ACT inhaler Inhale 2 puffs into the lungs every 6 (six) hours as needed.  1 Inhaler  3  . albuterol (PROVENTIL) (2.5 MG/3ML) 0.083% nebulizer solution Take 3 mLs (2.5 mg total) by nebulization every 6 (six) hours as needed for wheezing.  75 mL  1  . amLODipine (NORVASC) 10 MG tablet Take 5 mg by mouth daily.      Marland Kitchen aspirin 81 MG tablet Take 81 mg by mouth daily.        . bisoprolol (ZEBETA) 10 MG tablet Take 1 tablet (10 mg total) by mouth daily.  30 tablet  11  . Fluticasone-Salmeterol (ADVAIR DISKUS) 250-50 MCG/DOSE AEPB Inhale 1 puff into the lungs 2 (two) times daily.  1 each  3  . furosemide (LASIX) 20 MG tablet Take 1 tablet (20 mg total) by mouth daily.  30 tablet  1  . losartan (COZAAR) 50 MG tablet Take 1 tablet (50 mg total) by mouth 2 (two) times daily.  60 tablet  11  . montelukast (SINGULAIR) 10 MG tablet Take 1 tablet (10 mg total) by mouth at bedtime.  30 tablet  4  . sertraline (ZOLOFT) 50 MG tablet Take 1 tablet (50  mg total) by mouth daily.  30 tablet  6  . sildenafil (VIAGRA) 100 MG tablet Take 1 tablet (100 mg total) by mouth daily as needed for erectile dysfunction.  6 tablet  4    Past Medical History  Diagnosis Date  . Cardiomyopathy     with a negative cardiac catheterization in the past. (EF appriximately 40-45%)   . HTN (hypertension)     x 30 years  . Obstructive sleep apnea (adult) (pediatric)   . Chronic systolic heart failure   . 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 Obesity, unspecified   . Asthma     Hx of childhood asthma, disappeared for a while, then resurfaced 6-7 years ago.     No past surgical history on file.  ROS:  As stated in the HPI and negative for all other systems.  PHYSICAL EXAM BP 165/90  Pulse 54  Ht 6' (1.829 m)  Wt 270 lb 9.6 oz (122.743 kg)  BMI 36.70 kg/m2 GENERAL:  Well appearing HEENT:  Pupils equal round and reactive, fundi not visualized, oral mucosa unremarkable NECK:  No jugular venous distention, waveform within normal limits, carotid upstroke brisk and symmetric, no bruits, no thyromegaly LYMPHATICS:  No cervical, inguinal adenopathy LUNGS:  Clear to auscultation bilaterally BACK:  No CVA  tenderness CHEST:  Unremarkable HEART:  PMI not displaced or sustained,S1 and S2 within normal limits, no S3, no S4, no clicks, no rubs, no murmurs ABD:  Flat, positive bowel sounds normal in frequency in pitch, no bruits, no rebound, no guarding, no midline pulsatile mass, no hepatomegaly, no splenomegaly, obese EXT:  2 plus pulses throughout, moderate edema, no cyanosis no clubbing SKIN:  No rashes no nodules NEURO:  Cranial nerves II through XII grossly intact, motor grossly intact throughout [facility]:  Cognitively intact, oriented to person place and time   EKG:  Sinus bradycardia, rate 54, axis within normal limits, intervals within normal limits, nonspecific inferolateral T wave flattening, PVCs 12/27/2011  ASSESSMENT AND  PLAN  CARDIOMYOPATHY -  Given the edema and increased dyspnea I will repeat an echocardiogram, check a TSH and a BNP.  HYPERTENSION -  He is having his blood pressure actively managed by his PCP. Therefore, I will suggest no changes.  OBSTRUCTIVE SLEEP APNEA -  He is wearing the CPAP every night.    OBESITY, UNSPECIFIED -  He has gained a pound since I last saw him and we discussed this and specifics for diet and exercise.  EDEMA - This will be evaluated as above. In addition he was given a prescription for compression stockings.

## 2011-12-30 ENCOUNTER — Ambulatory Visit (HOSPITAL_COMMUNITY): Payer: Medicare Other | Attending: Cardiovascular Disease | Admitting: Radiology

## 2011-12-30 DIAGNOSIS — R0989 Other specified symptoms and signs involving the circulatory and respiratory systems: Secondary | ICD-10-CM

## 2011-12-30 DIAGNOSIS — I369 Nonrheumatic tricuspid valve disorder, unspecified: Secondary | ICD-10-CM | POA: Insufficient documentation

## 2011-12-30 DIAGNOSIS — I319 Disease of pericardium, unspecified: Secondary | ICD-10-CM | POA: Insufficient documentation

## 2011-12-30 DIAGNOSIS — I379 Nonrheumatic pulmonary valve disorder, unspecified: Secondary | ICD-10-CM | POA: Insufficient documentation

## 2011-12-30 DIAGNOSIS — I5022 Chronic systolic (congestive) heart failure: Secondary | ICD-10-CM | POA: Insufficient documentation

## 2011-12-30 DIAGNOSIS — I10 Essential (primary) hypertension: Secondary | ICD-10-CM | POA: Insufficient documentation

## 2011-12-30 DIAGNOSIS — I428 Other cardiomyopathies: Secondary | ICD-10-CM

## 2011-12-30 DIAGNOSIS — R609 Edema, unspecified: Secondary | ICD-10-CM | POA: Insufficient documentation

## 2011-12-30 DIAGNOSIS — R0609 Other forms of dyspnea: Secondary | ICD-10-CM | POA: Insufficient documentation

## 2011-12-30 DIAGNOSIS — I059 Rheumatic mitral valve disease, unspecified: Secondary | ICD-10-CM | POA: Insufficient documentation

## 2011-12-30 NOTE — Progress Notes (Signed)
Echocardiogram performed.  

## 2012-01-03 ENCOUNTER — Other Ambulatory Visit: Payer: Self-pay | Admitting: Family

## 2012-01-03 MED ORDER — AMLODIPINE BESYLATE 10 MG PO TABS: 5.0000 mg | ORAL_TABLET | Freq: Every day | ORAL | Status: DC

## 2012-01-07 ENCOUNTER — Telehealth: Payer: Self-pay | Admitting: Cardiology

## 2012-01-07 ENCOUNTER — Other Ambulatory Visit: Payer: Self-pay | Admitting: *Deleted

## 2012-01-07 DIAGNOSIS — I5022 Chronic systolic (congestive) heart failure: Secondary | ICD-10-CM

## 2012-01-07 DIAGNOSIS — N259 Disorder resulting from impaired renal tubular function, unspecified: Secondary | ICD-10-CM

## 2012-01-07 MED ORDER — FUROSEMIDE 20 MG PO TABS: 30.0000 mg | ORAL_TABLET | Freq: Every day | ORAL | Status: DC

## 2012-01-07 NOTE — Telephone Encounter (Signed)
Reviewed echo results with pt.  He would like to be seen sooner than 2 months therefore appointment was moved up.

## 2012-01-07 NOTE — Telephone Encounter (Signed)
Pt returning nurse call, he can be reached at [phone]

## 2012-01-17 ENCOUNTER — Other Ambulatory Visit (INDEPENDENT_AMBULATORY_CARE_PROVIDER_SITE_OTHER): Payer: Medicare Other

## 2012-01-17 DIAGNOSIS — I5022 Chronic systolic (congestive) heart failure: Secondary | ICD-10-CM

## 2012-01-17 DIAGNOSIS — N259 Disorder resulting from impaired renal tubular function, unspecified: Secondary | ICD-10-CM

## 2012-01-17 LAB — BASIC METABOLIC PANEL
CO2: 24 mEq/L (ref 19–32)
Chloride: 107 mEq/L (ref 96–112)
Potassium: 4.2 mEq/L (ref 3.5–5.1)
Sodium: 138 mEq/L (ref 135–145)

## 2012-01-18 ENCOUNTER — Telehealth: Payer: Self-pay | Admitting: Cardiology

## 2012-01-18 NOTE — Telephone Encounter (Signed)
Left message for pt to have repeat labs at next appt and to call back if further questions

## 2012-01-18 NOTE — Telephone Encounter (Signed)
Patient returning nurse call, he can be reached at [phone].

## 2012-01-18 NOTE — Telephone Encounter (Signed)
Line busy X 3  Will continue to attempt to reach pt to review lab.  He is to have them repeated at next office visit

## 2012-02-03 ENCOUNTER — Ambulatory Visit: Payer: Medicare Other | Admitting: Cardiology

## 2012-02-04 ENCOUNTER — Encounter: Payer: Self-pay | Admitting: Cardiology

## 2012-02-04 ENCOUNTER — Ambulatory Visit (INDEPENDENT_AMBULATORY_CARE_PROVIDER_SITE_OTHER): Payer: Medicare Other | Admitting: Cardiology

## 2012-02-04 VITALS — BP 150/90 | HR 50 | Resp 16 | Ht 72.0 in | Wt 267.0 lb

## 2012-02-04 DIAGNOSIS — I10 Essential (primary) hypertension: Secondary | ICD-10-CM

## 2012-02-04 DIAGNOSIS — I5022 Chronic systolic (congestive) heart failure: Secondary | ICD-10-CM

## 2012-02-04 NOTE — Patient Instructions (Addendum)
Please increase your Lasix to 60 mg a day for 3 days Continue all other medications as listed.  Follow up in 3 months with Dr Antoine Poche  Please sign up for My Chart.  Instructions included

## 2012-02-04 NOTE — Progress Notes (Signed)
HPI The patient presents for followup of known cardiomyopathy.  At the last visit he was having some increased dyspnea. I increased his diuretic. We had reduced his Norvasc because of swelling. He started wearing compression stockings. He try to reduce his salt intake. He thinks his breathing is better with this. I did do followup labs and his creatinine was mildly elevated but stable. His weight is essentially the same. He's not describing any PND or orthopnea. He's not having any palpitations, presyncope or syncope. He thinks his lower extremity swelling is better but it is still present. He did have an echo which suggests his EF to be 35-40% which is perhaps slightly lower than previous. He's not describing any chest pressure, neck or arm discomfort. He is having no palpitations, presyncope or syncope.  Allergies  Allergen Reactions  . Ace Inhibitors     REACTION: cough  . Isosorb Dinitrate-Hydralazine     REACTION: dizziness/hypotensive    Current Outpatient Prescriptions  Medication Sig Dispense Refill  . ADVAIR DISKUS 250-50 MCG/DOSE AEPB INHALE ONE DOSE BY MOUTH TWICE DAILY  60 each  3  . albuterol (PROVENTIL HFA) 108 (90 BASE) MCG/ACT inhaler Inhale 2 puffs into the lungs every 6 (six) hours as needed.  1 Inhaler  3  . albuterol (PROVENTIL) (2.5 MG/3ML) 0.083% nebulizer solution Take 3 mLs (2.5 mg total) by nebulization every 6 (six) hours as needed for wheezing.  75 mL  1  . amLODipine (NORVASC) 10 MG tablet Take 0.5 tablets (5 mg total) by mouth daily.  15 tablet  3  . aspirin 81 MG tablet Take 81 mg by mouth daily.        . furosemide (LASIX) 20 MG tablet Take 1.5 tablets (30 mg total) by mouth daily.  45 tablet  6  . losartan (COZAAR) 50 MG tablet Take 1 tablet (50 mg total) by mouth 2 (two) times daily.  60 tablet  11  . montelukast (SINGULAIR) 10 MG tablet Take 1 tablet (10 mg total) by mouth at bedtime.  30 tablet  4  . sertraline (ZOLOFT) 50 MG tablet Take 1 tablet (50 mg  total) by mouth daily.  30 tablet  6  . sildenafil (VIAGRA) 100 MG tablet Take 1 tablet (100 mg total) by mouth daily as needed for erectile dysfunction.  6 tablet  4  . bisoprolol (ZEBETA) 10 MG tablet Take 1 tablet (10 mg total) by mouth daily.  30 tablet  11    Past Medical History  Diagnosis Date  . Cardiomyopathy     with a negative cardiac catheterization in the past. (EF appriximately 40-45%)   . HTN (hypertension)     x 30 years  . Obstructive sleep apnea (adult) (pediatric)   . Chronic systolic heart failure   . 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 Obesity, unspecified   . Asthma     Hx of childhood asthma, disappeared for a while, then resurfaced 6-7 years ago.     Past Surgical History  Procedure Date  . None     ROS:  As stated in the HPI and negative for all other systems.  PHYSICAL EXAM BP 150/90  Pulse 50  Resp 16  Ht 6' (1.829 m)  Wt 267 lb (121.11 kg)  BMI 36.21 kg/m2 GENERAL:  Well appearing HEENT:  Pupils equal round and reactive, fundi not visualized, oral mucosa unremarkable NECK:  No jugular venous distention, waveform within normal limits, carotid upstroke brisk and  symmetric, no bruits, no thyromegaly LYMPHATICS:  No cervical, inguinal adenopathy LUNGS:  Clear to auscultation bilaterally BACK:  No CVA tenderness CHEST:  Unremarkable HEART:  PMI not displaced or sustained,S1 and S2 within normal limits, no S3, no S4, no clicks, no rubs, no murmurs ABD:  Flat, positive bowel sounds normal in frequency in pitch, no bruits, no rebound, no guarding, no midline pulsatile mass, no hepatomegaly, no splenomegaly, obese EXT:  2 plus pulses throughout, moderate edema, no cyanosis no clubbing SKIN:  No rashes no nodules NEURO:  Cranial nerves II through XII grossly intact, motor grossly intact throughout PSYCH:  Cognitively intact, oriented to person place and time   ASSESSMENT AND PLAN  CARDIOMYOPATHY -  He thinks his symptoms  are better. At this point no further invasive evaluation is warranted. I will continue current therapy with attention to blood pressure management, weight loss, salt and fluid restriction.  HYPERTENSION -  His blood pressure is slightly elevated. He would like to avoid other medications. We had a long discussion about the need to lose weight if he wants to do this.  OBSTRUCTIVE SLEEP APNEA -  He is wearing the CPAP every night.    OBESITY, UNSPECIFIED -  We had a long discussion about weight loss and I gave him specifics on diet and exercise.    EDEMA - This will be evaluated as above. In addition he was given a prescription for compression stockings.  I will give him an extra 30 mg of Lasix daily for 3 days. We again discussed salt restriction.

## 2012-02-14 ENCOUNTER — Other Ambulatory Visit: Payer: Self-pay | Admitting: Family

## 2012-02-14 NOTE — Telephone Encounter (Signed)
Rx to pharmacy/SLS 

## 2012-03-02 ENCOUNTER — Ambulatory Visit: Payer: Medicare Other | Admitting: Cardiology

## 2012-05-02 ENCOUNTER — Telehealth: Payer: Self-pay | Admitting: Family

## 2012-05-02 MED ORDER — AMLODIPINE BESYLATE 5 MG PO TABS: 5.0000 mg | ORAL_TABLET | Freq: Every day | ORAL | Status: DC

## 2012-05-02 NOTE — Telephone Encounter (Signed)
Rx was called to pharmacy voicemail.

## 2012-05-02 NOTE — Telephone Encounter (Signed)
amlodopine  Pharmacy comments:  Pt wants to know if he can do 5mg  instead of 10mg  so he doesn't have to keep cutting the pill?

## 2012-05-02 NOTE — Telephone Encounter (Signed)
Rx sent, detailed message left on pt's home# and to call if any questions.

## 2012-05-04 ENCOUNTER — Ambulatory Visit: Payer: Medicare Other | Admitting: Cardiology

## 2012-05-22 ENCOUNTER — Ambulatory Visit: Payer: Medicare Other | Admitting: Cardiology

## 2012-07-03 ENCOUNTER — Ambulatory Visit (INDEPENDENT_AMBULATORY_CARE_PROVIDER_SITE_OTHER): Payer: Medicare Other | Admitting: Cardiology

## 2012-07-03 ENCOUNTER — Encounter: Payer: Self-pay | Admitting: *Deleted

## 2012-07-03 ENCOUNTER — Encounter: Payer: Self-pay | Admitting: Cardiology

## 2012-07-03 VITALS — BP 166/95 | HR 55 | Ht 72.0 in | Wt 263.0 lb

## 2012-07-03 DIAGNOSIS — I10 Essential (primary) hypertension: Secondary | ICD-10-CM

## 2012-07-03 DIAGNOSIS — I5022 Chronic systolic (congestive) heart failure: Secondary | ICD-10-CM

## 2012-07-03 DIAGNOSIS — I428 Other cardiomyopathies: Secondary | ICD-10-CM

## 2012-07-03 MED ORDER — SPIRONOLACTONE 25 MG PO TABS: 25.0000 mg | ORAL_TABLET | Freq: Every day | ORAL | Status: DC

## 2012-07-03 NOTE — Patient Instructions (Addendum)
Your physician wants you to follow-up in: 6 months with Dr Hoyle Barr will receive a reminder letter in the mail two months in advance. If you don't receive a letter, please call our office to schedule the follow-up appointment.  Your physician has recommended you make the following change in your medication:  1) Start Spironolactone 25mg  daily  Your physician recommends that you return for lab work in: 1 week and again in 1 month for a BMP  07/11/12 at 11am 08/09/12 at 11am

## 2012-07-03 NOTE — Progress Notes (Signed)
HPI The patient presents for followup of known cardiomyopathy. Since I last saw him he has done well.  The patient denies any new symptoms such as chest discomfort, neck or arm discomfort. There has been no new shortness of breath, PND or orthopnea. There have been no reported palpitations, presyncope or syncope.  He is not exercising as I would like.  He does have less edema.  He has managed to lose 4 pounds over the winter.  He is watching his salt.   Allergies  Allergen Reactions  . Ace Inhibitors     REACTION: cough  . Isosorb Dinitrate-Hydralazine     REACTION: dizziness/hypotensive    Current Outpatient Prescriptions  Medication Sig Dispense Refill  . ADVAIR DISKUS 250-50 MCG/DOSE AEPB INHALE ONE DOSE BY MOUTH TWICE DAILY  60 each  3  . albuterol (PROVENTIL HFA) 108 (90 BASE) MCG/ACT inhaler Inhale 2 puffs into the lungs every 6 (six) hours as needed.  1 Inhaler  3  . albuterol (PROVENTIL) (2.5 MG/3ML) 0.083% nebulizer solution Take 2.5 mg by nebulization every 6 (six) hours as needed for wheezing.      Marland Kitchen amLODipine (NORVASC) 5 MG tablet Take 1 tablet (5 mg total) by mouth daily.  30 tablet  2  . aspirin 81 MG tablet Take 81 mg by mouth daily.        . bisoprolol (ZEBETA) 10 MG tablet Take 1 tablet (10 mg total) by mouth daily.  30 tablet  11  . furosemide (LASIX) 20 MG tablet Take 1.5 tablets (30 mg total) by mouth daily.  45 tablet  6  . losartan (COZAAR) 50 MG tablet Take 1 tablet (50 mg total) by mouth 2 (two) times daily.  60 tablet  11  . montelukast (SINGULAIR) 10 MG tablet TAKE ONE TABLET BY MOUTH AT BEDTIME  30 tablet  4  . sertraline (ZOLOFT) 50 MG tablet Take 1 tablet (50 mg total) by mouth daily.  30 tablet  6  . sildenafil (VIAGRA) 100 MG tablet Take 1 tablet (100 mg total) by mouth daily as needed for erectile dysfunction.  6 tablet  4   No current facility-administered medications for this visit.    Past Medical History  Diagnosis Date  . Cardiomyopathy    with a negative cardiac catheterization in the past. (EF appriximately 40-45%)   . HTN (hypertension)     x 30 years  . Obstructive sleep apnea (adult) (pediatric)   . Chronic systolic heart failure   . 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 Obesity, unspecified   . Asthma     Hx of childhood asthma, disappeared for a while, then resurfaced 6-7 years ago.     Past Surgical History  Procedure Laterality Date  . None      ROS:  As stated in the HPI and negative for all other systems.  PHYSICAL EXAM BP 166/95  Pulse 55  Ht 6' (1.829 m)  Wt 263 lb (119.296 kg)  BMI 35.66 kg/m2 GENERAL:  Well appearing HEENT:  Pupils equal round and reactive, fundi not visualized, oral mucosa unremarkable NECK:  No jugular venous distention, waveform within normal limits, carotid upstroke brisk and symmetric, no bruits, no thyromegaly LUNGS:  Clear to auscultation bilaterally BACK:  No CVA tenderness CHEST:  Unremarkable HEART:  PMI not displaced or sustained,S1 and S2 within normal limits, no S3, no S4, no clicks, no rubs, no murmurs ABD:  Flat, positive bowel sounds normal in frequency in  pitch, no bruits, no rebound, no guarding, no midline pulsatile mass, no hepatomegaly, no splenomegaly, obese EXT:  2 plus pulses throughout, mild edema, no cyanosis no clubbing  EKG:  Sinus rhythm, rate 55, axis within normal limits, intervals within normal limits, no acute ST-T wave changes, nonspecific T-wave flattening. 07/03/2012   ASSESSMENT AND PLAN  CARDIOMYOPATHY -  He seems to be euvolemic.  At this point, no change in therapy is indicated.  We have reviewed salt and fluid restrictions.  No further cardiovascular testing is indicated.  I will tritrate meds as below.   HYPERTENSION -  His blood pressure is still slightly elevated. I will add spironolactone.  I will schedule followup of his basic metabolic profile per guidelines suggestions.  OBSTRUCTIVE SLEEP APNEA -  He is  wearing the CPAP every night.    OBESITY, UNSPECIFIED -  We had a long discussion about weight loss and I again gave him specifics on diet and exercise.    EDEMA - This is improved and he is wearing his stockings.  No change in therapy is planned other than above.

## 2012-07-11 ENCOUNTER — Other Ambulatory Visit (INDEPENDENT_AMBULATORY_CARE_PROVIDER_SITE_OTHER): Payer: Medicare Other

## 2012-07-11 DIAGNOSIS — I10 Essential (primary) hypertension: Secondary | ICD-10-CM

## 2012-07-11 DIAGNOSIS — I5022 Chronic systolic (congestive) heart failure: Secondary | ICD-10-CM

## 2012-07-11 LAB — BASIC METABOLIC PANEL
BUN: 22 mg/dL (ref 6–23)
Calcium: 9.6 mg/dL (ref 8.4–10.5)
Creatinine, Ser: 1.7 mg/dL — ABNORMAL HIGH (ref 0.4–1.5)
GFR: 52.34 mL/min — ABNORMAL LOW (ref >60.00)

## 2012-07-19 ENCOUNTER — Telehealth: Payer: Self-pay | Admitting: Cardiology

## 2012-07-19 NOTE — Telephone Encounter (Signed)
Follow Up      Pt calling in following up on lab results. Would like to speak to nurse.

## 2012-07-20 ENCOUNTER — Telehealth: Payer: Self-pay | Admitting: Family

## 2012-07-20 NOTE — Telephone Encounter (Signed)
Refill- advair diskus 250/50 mis. Inhale one dose by mouth twice daily. Qty 60 last fill 3.19.14

## 2012-07-20 NOTE — Telephone Encounter (Signed)
LMTCB ./CY 

## 2012-07-20 NOTE — Telephone Encounter (Signed)
Left message of lab results and to continue current treatment and meds

## 2012-07-21 MED ORDER — FLUTICASONE-SALMETEROL 250-50 MCG/DOSE IN AEPB: INHALATION_SPRAY | RESPIRATORY_TRACT | Status: DC

## 2012-07-21 NOTE — Telephone Encounter (Signed)
Rx sent in to pharmacy. 

## 2012-08-09 ENCOUNTER — Other Ambulatory Visit (INDEPENDENT_AMBULATORY_CARE_PROVIDER_SITE_OTHER): Payer: Medicare Other

## 2012-08-09 ENCOUNTER — Other Ambulatory Visit: Payer: Self-pay | Admitting: Internal Medicine

## 2012-08-09 DIAGNOSIS — I5022 Chronic systolic (congestive) heart failure: Secondary | ICD-10-CM

## 2012-08-09 DIAGNOSIS — I10 Essential (primary) hypertension: Secondary | ICD-10-CM

## 2012-08-09 LAB — BASIC METABOLIC PANEL
Calcium: 9.5 mg/dL (ref 8.4–10.5)
GFR: 60.62 mL/min (ref >60.00)
Glucose, Bld: 108 mg/dL — ABNORMAL HIGH (ref 70–99)
Sodium: 137 mEq/L (ref 135–145)

## 2012-08-10 ENCOUNTER — Telehealth: Payer: Self-pay | Admitting: *Deleted

## 2012-08-10 MED ORDER — SERTRALINE HCL 50 MG PO TABS: 50.0000 mg | ORAL_TABLET | Freq: Every day | ORAL | Status: DC

## 2012-08-10 NOTE — Telephone Encounter (Signed)
Received fax from [employer] for zoloft. States they have not received previous authorization for medication. Upon review of chart, last rx was sent 08/03/11. Pt was last seen in our office on 08/03/11 and advised follow up in September. Pt is past due for follow up. Will send 2 week supply of zoloft but pt needs appt before 2 weeks is up to receive further refills. Please call pt to arrange appt.

## 2012-08-11 NOTE — Telephone Encounter (Signed)
Informed patient of medication refill and that he needs to schedule appointment before further refills can be given. He states that he will have to call back to reschedule.

## 2012-08-11 NOTE — Telephone Encounter (Signed)
Received transmission failure notice of eRx for sertraline. Rx called to pharmacy voicemail.

## 2012-08-22 ENCOUNTER — Telehealth: Payer: Self-pay | Admitting: Family

## 2012-08-22 MED ORDER — MONTELUKAST SODIUM 10 MG PO TABS: ORAL_TABLET | ORAL | Status: DC

## 2012-08-22 NOTE — Telephone Encounter (Signed)
Rx sent Left Pt detail message Rx sent and to call to schedule OV.

## 2012-08-22 NOTE — Telephone Encounter (Signed)
HE IS GOING OUT OF TOWN AND NEEDS SINGULAIR TO TAKE WITH HIM.  TAKE 1 PER DAY WOULD LIKE RX FOR 30 CALLED IN TODAY SO HE CAN TAKE THE MEDS WITH HIM

## 2012-09-22 ENCOUNTER — Encounter: Payer: Self-pay | Admitting: Nurse Practitioner

## 2012-09-22 ENCOUNTER — Ambulatory Visit (INDEPENDENT_AMBULATORY_CARE_PROVIDER_SITE_OTHER): Payer: Medicare Other | Admitting: Nurse Practitioner

## 2012-09-22 ENCOUNTER — Telehealth: Payer: Self-pay | Admitting: Nurse Practitioner

## 2012-09-22 VITALS — BP 172/96 | HR 65 | Temp 98.2°F | Resp 16 | Wt 264.2 lb

## 2012-09-22 DIAGNOSIS — R600 Localized edema: Secondary | ICD-10-CM

## 2012-09-22 DIAGNOSIS — I10 Essential (primary) hypertension: Secondary | ICD-10-CM

## 2012-09-22 DIAGNOSIS — Z23 Encounter for immunization: Secondary | ICD-10-CM

## 2012-09-22 DIAGNOSIS — R609 Edema, unspecified: Secondary | ICD-10-CM

## 2012-09-22 DIAGNOSIS — I499 Cardiac arrhythmia, unspecified: Secondary | ICD-10-CM

## 2012-09-22 DIAGNOSIS — I428 Other cardiomyopathies: Secondary | ICD-10-CM

## 2012-09-22 LAB — CBC
HCT: 39.2 % (ref 39.0–52.0)
Hemoglobin: 13.3 g/dL (ref 13.0–17.0)
MCH: 30.5 pg (ref 26.0–34.0)
MCV: 89.9 fL (ref 78.0–100.0)
Platelets: 214 10*3/uL (ref 150–400)
RBC: 4.36 MIL/uL (ref 4.22–5.81)

## 2012-09-22 LAB — HEPATIC FUNCTION PANEL
Indirect Bilirubin: 0.2 mg/dL (ref 0.0–0.9)
Total Protein: 6.4 g/dL (ref 6.0–8.3)

## 2012-09-22 LAB — LIPID PANEL
HDL: 46 mg/dL (ref >39)
LDL Cholesterol: 103 mg/dL — ABNORMAL HIGH (ref 0–99)
Triglycerides: 113 mg/dL (ref <150)

## 2012-09-22 MED ORDER — TETANUS-DIPHTH-ACELL PERTUSSIS 5-2.5-18.5 LF-MCG/0.5 IM SUSP: 0.5000 mL | Freq: Once | INTRAMUSCULAR | Status: DC

## 2012-09-22 MED ORDER — BISOPROLOL FUMARATE 10 MG PO TABS: 10.0000 mg | ORAL_TABLET | Freq: Every day | ORAL | Status: DC

## 2012-09-22 MED ORDER — FUROSEMIDE 20 MG PO TABS: 30.0000 mg | ORAL_TABLET | Freq: Every day | ORAL | Status: DC

## 2012-09-22 MED ORDER — AMLODIPINE BESYLATE 5 MG PO TABS: 5.0000 mg | ORAL_TABLET | Freq: Every day | ORAL | Status: DC

## 2012-09-22 MED ORDER — LOSARTAN POTASSIUM 50 MG PO TABS: 50.0000 mg | ORAL_TABLET | Freq: Two times a day (BID) | ORAL | Status: DC

## 2012-09-22 NOTE — Telephone Encounter (Signed)
Spoke with pt and he confirmed receipt of lasix with other medications.

## 2012-09-22 NOTE — Progress Notes (Signed)
Subjective:    Patient here for follow-up of elevated blood pressure. He has been without his blood pressure medicines for about 2 months, with the exception of spironolactone, which was filled by his cardiologist at his visit in May.  He is not exercising and is not adherent to a low-salt diet.  Blood pressure is not well controlled at home. Cardiac symptoms: irregular heart beat and lower extremity edema. Patient denies: chest pain, chest pressure/discomfort and near-syncope. Cardiovascular risk factors: advanced age (older than 73 for men, 43 for women), hypertension, male gender, obesity (BMI >= 30 kg/m2) and sedentary lifestyle. Use of agents associated with hypertension: none and viagra. History of target organ damage: none and he reports gradual vision changes-last eye exam was 2 years ago..  The following portions of the patient's history were reviewed and updated as appropriate: allergies, current medications, past medical history, past surgical history and problem list.  Review of Systems Constitutional: negative for fatigue, fevers and night sweats Respiratory: negative for asthma, cough, dyspnea on exertion and wheezing Cardiovascular: positive for irregular heart beat and lower extremity edema, negative for chest pain, chest pressure/discomfort and syncope Neurological: negative for dizziness, headaches and paresthesia     Objective:    BP 172/96  Pulse 65  Temp(Src) 98.2 F (36.8 C) (Oral)  Resp 16  Wt 264 lb 4 oz (119.863 kg)  BMI 35.83 kg/m2  SpO2 97% General appearance: alert, cooperative, appears stated age, no distress and moderately obese Head: Normocephalic, without obvious abnormality, atraumatic Eyes: negative findings: lids and lashes normal, conjunctivae and sclerae normal and corneas clear Lungs: clear to auscultation bilaterally Heart: regularly irregular rhythm, no click and no rub Abdomen: soft, non-tender; bowel sounds normal; no masses,  no organomegaly and  obese Extremities: edema +1 pitting to mid-shinn, bilat and no ulcers, gangrene or trophic changes Pulses: 2+ and symmetric    Assessment:    Hypertension, stage 2 -taking cozaar, spironolactone, zebeta, amlodipine, and lasix. Has not had any meds in 2 mos except spironolactone. Recent Bmet normal.  Lower extremity edema Needs Tdap Obesity Class 11       Plan:  get hepatic, ua/micro, HgA1C, lipids today, monitor results. Needs eye exam. Refill meds. Encourage daily exercise. F/u 6 mos. Refill lasix, exercise Tdap administered today Increase activity-Daily walk

## 2012-09-22 NOTE — Patient Instructions (Addendum)
Restart medications. Please do not let meds run out, they are important to prevent kidney damage and stroke. I think your heart is besting irregularly today because you have been off your cardiac meds. Take a 15 minute walk daily. Get an eye exam. Take tylenol if your arm is sore from the vaccine. This office will call if any labs need to be discussed. Remember your flu shot in Sept. Please return in 6 months. You will return in  Pleasure to meet you!  Hypertension As your heart beats, it forces blood through your arteries. This force is your blood pressure. If the pressure is too high, it is called hypertension (HTN) or high blood pressure. HTN is dangerous because you may have it and not know it. High blood pressure may mean that your heart has to work harder to pump blood. Your arteries may be narrow or stiff. The extra work puts you at risk for heart disease, stroke, and other problems.  Blood pressure consists of two numbers, a higher number over a lower, 110/72, for example. It is stated as "110 over 72." The ideal is below 120 for the top number (systolic) and under 80 for the bottom (diastolic). Write down your blood pressure today. You should pay close attention to your blood pressure if you have certain conditions such as:  Heart failure.  Prior heart attack.  Diabetes  Chronic kidney disease.  Prior stroke.  Multiple risk factors for heart disease. To see if you have HTN, your blood pressure should be measured while you are seated with your arm held at the level of the heart. It should be measured at least twice. A one-time elevated blood pressure reading (especially in the Emergency Department) does not mean that you need treatment. There may be conditions in which the blood pressure is different between your right and left arms. It is important to see your caregiver soon for a recheck. Most people have essential hypertension which means that there is not a specific cause. This type of  high blood pressure may be lowered by changing lifestyle factors such as:  Stress.  Smoking.  Lack of exercise.  Excessive weight.  Drug/tobacco/alcohol use.  Eating less salt. Most people do not have symptoms from high blood pressure until it has caused damage to the body. Effective treatment can often prevent, delay or reduce that damage. TREATMENT  When a cause has been identified, treatment for high blood pressure is directed at the cause. There are a large number of medications to treat HTN. These fall into several categories, and your caregiver will help you select the medicines that are best for you. Medications may have side effects. You should review side effects with your caregiver. If your blood pressure stays high after you have made lifestyle changes or started on medicines,   Your medication(s) may need to be changed.  Other problems may need to be addressed.  Be certain you understand your prescriptions, and know how and when to take your medicine.  Be sure to follow up with your caregiver within the time frame advised (usually within two weeks) to have your blood pressure rechecked and to review your medications.  If you are taking more than one medicine to lower your blood pressure, make sure you know how and at what times they should be taken. Taking two medicines at the same time can result in blood pressure that is too low. SEEK IMMEDIATE MEDICAL CARE IF:  You develop a severe headache, blurred or  changing vision, or confusion.  You have unusual weakness or numbness, or a faint feeling.  You have severe chest or abdominal pain, vomiting, or breathing problems. MAKE SURE YOU:   Understand these instructions.  Will watch your condition.  Will get help right away if you are not doing well or get worse. Document Released: 03/01/2005 Document Revised: 05/24/2011 Document Reviewed: 10/20/2007 [facility] Patient Information 2014 [employer], [place].

## 2012-09-23 LAB — URINALYSIS, ROUTINE W REFLEX MICROSCOPIC
Hgb urine dipstick: NEGATIVE
Leukocytes, UA: NEGATIVE
Nitrite: NEGATIVE
Specific Gravity, Urine: 1.016 (ref 1.005–1.030)
pH: 6 (ref 5.0–8.0)

## 2012-09-27 ENCOUNTER — Other Ambulatory Visit: Payer: Self-pay | Admitting: Family

## 2012-11-01 ENCOUNTER — Other Ambulatory Visit: Payer: Self-pay | Admitting: Family

## 2012-11-01 NOTE — Telephone Encounter (Signed)
Rx request to pharmacy; PT NEEDS OFFICE VISIT/SLS

## 2012-11-02 ENCOUNTER — Telehealth: Payer: Self-pay | Admitting: *Deleted

## 2012-11-02 MED ORDER — SERTRALINE HCL 50 MG PO TABS: 50.0000 mg | ORAL_TABLET | Freq: Every day | ORAL | Status: DC

## 2012-11-02 NOTE — Telephone Encounter (Signed)
Faxed refill request received from pharmacy for Sertraline Last filled by MD on 05.29.14, #14x0 Last AEX - 10.11.13 [Seen by Maximino Sarin, NP 07.11.14 with f/u requested 6-mths] Next AEX - 3 Months [no appt scheduled] Please Advise/SLS

## 2012-11-02 NOTE — Telephone Encounter (Signed)
OK to send #30 with 2 refills.  I would like to see pt back for a 3 month follow up please.

## 2012-11-02 NOTE — Telephone Encounter (Signed)
Rx request to pharmacy; [facility] with contact name and number for return call RE: F/U needed [3-mth] per provider instructions/SLS

## 2012-11-08 ENCOUNTER — Other Ambulatory Visit: Payer: Self-pay | Admitting: Family

## 2012-11-08 NOTE — Telephone Encounter (Signed)
Denied--Last Rx eScribe 08.20.14, #30x0; Patient Needs Office Visit/SLS

## 2012-11-10 ENCOUNTER — Other Ambulatory Visit: Payer: Self-pay | Admitting: Family

## 2012-11-10 NOTE — Telephone Encounter (Signed)
Refill called to pharmacy voicemail as previous refill on 11/01/12 failed transmission.

## 2013-01-15 ENCOUNTER — Other Ambulatory Visit: Payer: Self-pay | Admitting: *Deleted

## 2013-01-15 DIAGNOSIS — I10 Essential (primary) hypertension: Secondary | ICD-10-CM

## 2013-01-15 MED ORDER — AMLODIPINE BESYLATE 5 MG PO TABS: 5.0000 mg | ORAL_TABLET | Freq: Every day | ORAL | Status: DC

## 2013-01-15 NOTE — Telephone Encounter (Signed)
Rx request to pharmacy, 30-day; **PATIENT DUE FOR FOLLOW-UP OFFICE VISIT WITH PCP**/SLS

## 2013-02-05 ENCOUNTER — Other Ambulatory Visit: Payer: Self-pay | Admitting: *Deleted

## 2013-02-05 MED ORDER — FLUTICASONE-SALMETEROL 250-50 MCG/DOSE IN AEPB: INHALATION_SPRAY | RESPIRATORY_TRACT | Status: DC

## 2013-02-05 NOTE — Telephone Encounter (Signed)
Rx request to pharmacy/SLS  

## 2013-02-13 ENCOUNTER — Ambulatory Visit (INDEPENDENT_AMBULATORY_CARE_PROVIDER_SITE_OTHER): Payer: Medicare Other | Admitting: Family

## 2013-02-13 ENCOUNTER — Encounter: Payer: Self-pay | Admitting: Family

## 2013-02-13 VITALS — BP 150/78 | HR 62 | Temp 97.8°F | Resp 16 | Wt 262.1 lb

## 2013-02-13 DIAGNOSIS — N259 Disorder resulting from impaired renal tubular function, unspecified: Secondary | ICD-10-CM

## 2013-02-13 DIAGNOSIS — I10 Essential (primary) hypertension: Secondary | ICD-10-CM

## 2013-02-13 DIAGNOSIS — I5022 Chronic systolic (congestive) heart failure: Secondary | ICD-10-CM

## 2013-02-13 DIAGNOSIS — J45909 Unspecified asthma, uncomplicated: Secondary | ICD-10-CM

## 2013-02-13 DIAGNOSIS — R739 Hyperglycemia, unspecified: Secondary | ICD-10-CM

## 2013-02-13 DIAGNOSIS — R7309 Other abnormal glucose: Secondary | ICD-10-CM

## 2013-02-13 DIAGNOSIS — I428 Other cardiomyopathies: Secondary | ICD-10-CM

## 2013-02-13 DIAGNOSIS — Z23 Encounter for immunization: Secondary | ICD-10-CM

## 2013-02-13 LAB — BASIC METABOLIC PANEL WITH GFR
BUN: 19 mg/dL (ref 6–23)
CO2: 26 mEq/L (ref 19–32)
GFR, Est African American: 56 mL/min — ABNORMAL LOW
Glucose, Bld: 107 mg/dL — ABNORMAL HIGH (ref 70–99)
Potassium: 4.5 mEq/L (ref 3.5–5.3)

## 2013-02-13 MED ORDER — MONTELUKAST SODIUM 10 MG PO TABS: ORAL_TABLET | ORAL | Status: DC

## 2013-02-13 MED ORDER — AMLODIPINE BESYLATE 10 MG PO TABS: 10.0000 mg | ORAL_TABLET | Freq: Every day | ORAL | Status: DC

## 2013-02-13 MED ORDER — AMLODIPINE BESYLATE 5 MG PO TABS: 5.0000 mg | ORAL_TABLET | Freq: Every day | ORAL | Status: DC

## 2013-02-13 MED ORDER — SILDENAFIL CITRATE 100 MG PO TABS: 100.0000 mg | ORAL_TABLET | Freq: Every day | ORAL | Status: DC | PRN

## 2013-02-13 MED ORDER — SERTRALINE HCL 50 MG PO TABS: 50.0000 mg | ORAL_TABLET | Freq: Every day | ORAL | Status: DC

## 2013-02-13 NOTE — Assessment & Plan Note (Signed)
Clinically compensated. Continue current meds.  Management per cardiology.

## 2013-02-13 NOTE — Progress Notes (Signed)
Subjective:    Patient ID: Brandon [name], male    DOB: 25-Apr-1940, 71 y.o.   MRN: [id number]  HPI  [name] is a 72 yr old male who present today for follow up of multiple medical problems.  He has not been seen in greater than 1 year.  HTN- He reports that he continues his meds. Denies chest pain or sob.    Hyperglycemia- last A1C 5.8 this past summer.    Chronic systolic heart failure- Reports occasional swelling to his feet in the evenings. Tries to wear compression socks.  Continues lasix 20mg  and spironolactone.    Asthma- reports that this time of year he needs to use albuterol nebs this helps. He continues advair.    Review of Systems See HPI  Past Medical History  Diagnosis Date  . Cardiomyopathy     with a negative cardiac catheterization in the past. (EF appriximately 40-45%)   . HTN (hypertension)     x 30 years  . Obstructive sleep apnea (adult) (pediatric)   . Chronic systolic heart failure   . 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 Obesity, unspecified   . Asthma     Hx of childhood asthma, disappeared for a while, then resurfaced 6-7 years ago.     History   Social History  . Marital Status: Married    Spouse Name: Brandon [name]    Number of Children: 3  . Years of Education: N/A   Occupational History  . retired    Social History Main Topics  . Smoking status: Former Games developer  . Smokeless tobacco: Never Used     Comment: quit smoling in 1990. started when 18, 1 ppd  . Alcohol Use: Yes     Comment: occasionaly   . Drug Use: Not on file  . Sexual Activity: Not on file   Other Topics Concern  . Not on file   Social History Narrative   Grew up in [place], attended [employer]. First wife died of breast ca in Jul 29, 1997. 3 children. Remarried- 8 years. 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    Pt signed designated party release granting access to [facility] to his wife Brandon [name]. Detailed message may be left on home  or cell phone. Roselle Locus August 13, 2009 11:34 am.     Past Surgical History  Procedure Laterality Date  . None      Family History  Problem Relation Age of Onset  . Cancer Father     multiple melanoma  . Lupus Sister   . Asthma Daughter     Allergies  Allergen Reactions  . Ace Inhibitors     REACTION: cough  . Isosorb Dinitrate-Hydralazine     REACTION: dizziness/hypotensive    Current Outpatient Prescriptions on File Prior to Visit  Medication Sig Dispense Refill  . albuterol (PROVENTIL HFA) 108 (90 BASE) MCG/ACT inhaler Inhale 2 puffs into the lungs every 6 (six) hours as needed.  1 Inhaler  3  . albuterol (PROVENTIL) (2.5 MG/3ML) 0.083% nebulizer solution Take 2.5 mg by nebulization every 6 (six) hours as needed for wheezing.      Marland Kitchen amLODipine (NORVASC) 5 MG tablet Take 1 tablet (5 mg total) by mouth daily.  30 tablet  0  . aspirin 81 MG tablet Take 81 mg by mouth daily.        . bisoprolol (ZEBETA) 10 MG tablet Take 1 tablet (10 mg total) by mouth daily.  30 tablet  11  . Fluticasone-Salmeterol (ADVAIR DISKUS) 250-50 MCG/DOSE AEPB INHALE ONE DOSE BY MOUTH TWICE DAILY  60 each  3  . furosemide (LASIX) 20 MG tablet Take 1.5 tablets (30 mg total) by mouth daily.  45 tablet  6  . losartan (COZAAR) 50 MG tablet Take 1 tablet (50 mg total) by mouth 2 (two) times daily.  60 tablet  11  . montelukast (SINGULAIR) 10 MG tablet TAKE ONE TABLET BY MOUTH ONCE DAILY AT BEDTIME. *NEED OFFICE VISIT*  30 tablet  2  . sertraline (ZOLOFT) 50 MG tablet Take 1 tablet (50 mg total) by mouth daily.  30 tablet  2  . sildenafil (VIAGRA) 100 MG tablet Take 1 tablet (100 mg total) by mouth daily as needed for erectile dysfunction.  6 tablet  4  . spironolactone (ALDACTONE) 25 MG tablet Take 1 tablet (25 mg total) by mouth daily.  90 tablet  3   No current facility-administered medications on file prior to visit.    BP 150/78  Pulse 62  Temp(Src) 97.8 F (36.6 C) (Oral)  Resp 16  Wt 262  lb 1.3 oz (118.879 kg)  SpO2 98%       Objective:   Physical Exam  Constitutional: He is oriented to person, place, and time. He appears well-developed and well-nourished. No distress.  HENT:  Head: Normocephalic.  Cardiovascular: Normal rate and regular rhythm.   No murmur heard. Pulmonary/Chest: Effort normal. No respiratory distress. He has wheezes in the right upper field. He has no rales. He exhibits no tenderness.  Musculoskeletal: He exhibits no edema.  Neurological: He is alert and oriented to person, place, and time.  Psychiatric: He has a normal mood and affect. His behavior is normal. Judgment and thought content normal.          Assessment & Plan:

## 2013-02-13 NOTE — Assessment & Plan Note (Signed)
Clinically stable, check bmet.

## 2013-02-13 NOTE — Progress Notes (Signed)
Pre visit review using our clinic review tool, if applicable. No additional management support is needed unless otherwise documented below in the visit note. 

## 2013-02-13 NOTE — Assessment & Plan Note (Signed)
Uncontrolled. Increase amlodipine from 5mg to 10mg.  

## 2013-02-13 NOTE — Assessment & Plan Note (Signed)
Fair control Continue current meds 

## 2013-02-13 NOTE — Patient Instructions (Addendum)
Please complete lab work prior to leaving.   Increase amlodipine from 5mg  to 10mg .  I [facility] sent [facility]mg  [facility]. Follow up in 1 month so we can recheck your blood pressure.

## 2013-02-15 ENCOUNTER — Encounter: Payer: Self-pay | Admitting: Family

## 2013-02-21 ENCOUNTER — Other Ambulatory Visit: Payer: Self-pay | Admitting: Family

## 2013-03-02 ENCOUNTER — Other Ambulatory Visit: Payer: Self-pay | Admitting: Family

## 2013-03-02 NOTE — Telephone Encounter (Signed)
PATIENT CALLED STATING HE HAD DISCUSSED HIS ALBUTEROL RX DURING HIS RECENT CPE VISIT AND NEEDS IT SENT IN AS HE IS OUT

## 2013-03-06 ENCOUNTER — Telehealth: Payer: Self-pay | Admitting: *Deleted

## 2013-03-06 MED ORDER — ALBUTEROL SULFATE HFA 108 (90 BASE) MCG/ACT IN AERS: 2.0000 | INHALATION_SPRAY | Freq: Four times a day (QID) | RESPIRATORY_TRACT | Status: DC | PRN

## 2013-03-06 NOTE — Telephone Encounter (Signed)
ProAir Rx sent.  Left detailed message on home# for pt explaining med change/denial and to call if any questions.

## 2013-03-06 NOTE — Telephone Encounter (Signed)
OK, same instructions as for proventil.

## 2013-03-06 NOTE — Telephone Encounter (Signed)
Received fax from [facility] stating Proventil is not covered by pt's insurance and they want to know if they can substitute ProAir?  Please advise.

## 2013-03-20 ENCOUNTER — Ambulatory Visit: Payer: Medicare Other | Admitting: Family

## 2013-03-30 ENCOUNTER — Other Ambulatory Visit: Payer: Self-pay | Admitting: Family

## 2013-04-06 ENCOUNTER — Ambulatory Visit (INDEPENDENT_AMBULATORY_CARE_PROVIDER_SITE_OTHER): Payer: Medicare Other | Admitting: Family

## 2013-04-06 ENCOUNTER — Encounter: Payer: Self-pay | Admitting: Family

## 2013-04-06 VITALS — BP 130/84 | HR 56 | Temp 98.4°F | Ht 72.0 in | Wt 262.0 lb

## 2013-04-06 DIAGNOSIS — R001 Bradycardia, unspecified: Secondary | ICD-10-CM

## 2013-04-06 DIAGNOSIS — I10 Essential (primary) hypertension: Secondary | ICD-10-CM

## 2013-04-06 DIAGNOSIS — I498 Other specified cardiac arrhythmias: Secondary | ICD-10-CM

## 2013-04-06 MED ORDER — ALBUTEROL SULFATE (2.5 MG/3ML) 0.083% IN NEBU: 2.5000 mg | INHALATION_SOLUTION | Freq: Four times a day (QID) | RESPIRATORY_TRACT | Status: DC | PRN

## 2013-04-06 MED ORDER — MONTELUKAST SODIUM 10 MG PO TABS: ORAL_TABLET | ORAL | Status: DC

## 2013-04-06 NOTE — Assessment & Plan Note (Addendum)
BP is improved today.  Continue same.  Mild bradycardia on exam.  EKG is performed today- notes ventricular bigeminy/PVC's.  Bradycardia likely secondary to bisoprolol.  Given hx of cardiomyopathy and CHF, will continue beta blocker and defer med changes to cardiology.  He is past due for follow up with Dr. Antoine Poche and I have instructed the patient to arrange follow up.

## 2013-04-06 NOTE — Progress Notes (Signed)
Pre-visit discussion using our clinic review tool. No additional management support is needed unless otherwise documented below in the visit note.  

## 2013-04-06 NOTE — Progress Notes (Signed)
Subjective:    Patient ID: Brandon [name], male    DOB: Dec 23, 1940, 73 y.o.   MRN: [id number]  HPI  [name] is a 73 yr old male who presents today for follow up.  HTN- last visit his blood pressure was elevated.  Amlodipine was increased from 5mg  to 10mg  at that time.  He reports tolerating medication without difficulty.  He does report very mild light headedness at times.   BP Readings from Last 3 Encounters:  04/06/13 130/84  02/13/13 150/78  09/22/12 172/96      Review of Systems    see HPI  Past Medical History  Diagnosis Date  . Cardiomyopathy     with a negative cardiac catheterization in the past. (EF appriximately 40-45%)   . HTN (hypertension)     x 30 years  . Obstructive sleep apnea (adult) (pediatric)   . Chronic systolic heart failure   . 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 Obesity, unspecified   . Asthma     Hx of childhood asthma, disappeared for a while, then resurfaced 6-7 years ago.     History   Social History  . Marital Status: Married    Spouse Name: 14/02/14    Number of Children: 3  . Years of Education: N/A   Occupational History  . retired    Social History Main Topics  . Smoking status: Former 11/23/12  . Smokeless tobacco: Never Used     Comment: quit smoling in 1990. started when 18, 1 ppd  . Alcohol Use: Yes     Comment: occasionaly   . Drug Use: Not on file  . Sexual Activity: Not on file   Other Topics Concern  . Not on file   Social History Narrative   Grew up in [place], attended [employer]. First wife died of breast ca in 1997-07-17. 3 children. Remarried- 8 years. 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    Pt signed designated party release granting access to [facility] to his wife [place]. Detailed message may be left on home or cell phone. [facility] August 13, 2009 11:34 am.     Past Surgical History  Procedure Laterality Date  . None      Family History  Problem  Relation Age of Onset  . Cancer Father     multiple melanoma  . Lupus Sister   . Asthma Daughter     Allergies  Allergen Reactions  . Ace Inhibitors     REACTION: cough  . Isosorb Dinitrate-Hydralazine     REACTION: dizziness/hypotensive    Current Outpatient Prescriptions on File Prior to Visit  Medication Sig Dispense Refill  . albuterol (PROAIR HFA) 108 (90 BASE) MCG/ACT inhaler Inhale 2 puffs into the lungs every 6 (six) hours as needed for wheezing or shortness of breath.  18 g  2  . albuterol (PROVENTIL) (2.5 MG/3ML) 0.083% nebulizer solution Take 2.5 mg by nebulization every 6 (six) hours as needed for wheezing.      Roselle Locus amLODipine (NORVASC) 10 MG tablet Take 1 tablet (10 mg total) by mouth daily.  30 tablet  2  . aspirin 81 MG tablet Take 81 mg by mouth daily.        . bisoprolol (ZEBETA) 10 MG tablet Take 1 tablet (10 mg total) by mouth daily.  30 tablet  11  . Fluticasone-Salmeterol (ADVAIR DISKUS) 250-50 MCG/DOSE AEPB INHALE ONE DOSE BY MOUTH TWICE DAILY  60 each  3  . furosemide (LASIX) 20 MG tablet Take 1.5 tablets (30 mg total) by mouth daily.  45 tablet  6  . losartan (COZAAR) 50 MG tablet Take 1 tablet (50 mg total) by mouth 2 (two) times daily.  60 tablet  11  . montelukast (SINGULAIR) 10 MG tablet TAKE ONE TABLET BY MOUTH ONCE DAILY AT BEDTIME  30 tablet  2  . sertraline (ZOLOFT) 50 MG tablet Take 1 tablet (50 mg total) by mouth daily.  30 tablet  5  . sildenafil (VIAGRA) 100 MG tablet Take 1 tablet (100 mg total) by mouth daily as needed for erectile dysfunction.  6 tablet  5  . spironolactone (ALDACTONE) 25 MG tablet Take 1 tablet (25 mg total) by mouth daily.  90 tablet  3   No current facility-administered medications on file prior to visit.    There were no vitals taken for this visit.    Objective:   Physical Exam  Constitutional: He is oriented to person, place, and time. He appears well-developed and well-nourished. No distress.  HENT:  Head:  Normocephalic and atraumatic.  Cardiovascular: Regular rhythm.  Bradycardia present.   No murmur heard. Pulmonary/Chest: Effort normal and breath sounds normal. No respiratory distress. He has no wheezes. He has no rales. He exhibits no tenderness.  Neurological: He is alert and oriented to person, place, and time.  Psychiatric: He has a normal mood and affect. His behavior is normal. Judgment and thought content normal.          Assessment & Plan:

## 2013-04-06 NOTE — Patient Instructions (Addendum)
Please continue current meds. Schedule follow up with Dr. Antoine Poche- cardiology. Follow up in 2 months.

## 2013-04-18 ENCOUNTER — Telehealth: Payer: Self-pay | Admitting: Family

## 2013-04-18 NOTE — Telephone Encounter (Signed)
Relevant patient education mailed to patient.  

## 2013-06-06 ENCOUNTER — Ambulatory Visit: Payer: Medicare Other | Admitting: Family

## 2013-06-06 DIAGNOSIS — Z0289 Encounter for other administrative examinations: Secondary | ICD-10-CM

## 2013-07-31 ENCOUNTER — Other Ambulatory Visit: Payer: Self-pay | Admitting: Family

## 2013-08-02 ENCOUNTER — Other Ambulatory Visit: Payer: Self-pay | Admitting: Cardiology

## 2013-09-08 ENCOUNTER — Other Ambulatory Visit: Payer: Self-pay | Admitting: Family

## 2013-09-10 NOTE — Telephone Encounter (Signed)
Refills sent for Advair. Pt was due for follow up in March and did not keep appt. Please call pt to arrange follow up before further refills are due.

## 2013-09-11 NOTE — Telephone Encounter (Signed)
Informed patient of medication refill and he scheduled appointment for 09/26/13

## 2013-09-13 ENCOUNTER — Other Ambulatory Visit: Payer: Self-pay | Admitting: Family

## 2013-09-13 NOTE — Telephone Encounter (Signed)
Rx request to pharmacy/SLS  

## 2013-09-26 ENCOUNTER — Ambulatory Visit: Payer: Medicare Other | Admitting: Family

## 2013-10-02 ENCOUNTER — Ambulatory Visit: Payer: Medicare Other | Admitting: Family

## 2013-10-09 ENCOUNTER — Other Ambulatory Visit: Payer: Self-pay | Admitting: Cardiology

## 2013-10-22 ENCOUNTER — Telehealth: Payer: Self-pay | Admitting: Family

## 2013-10-22 DIAGNOSIS — I428 Other cardiomyopathies: Secondary | ICD-10-CM

## 2013-10-22 DIAGNOSIS — I10 Essential (primary) hypertension: Secondary | ICD-10-CM

## 2013-10-22 DIAGNOSIS — I499 Cardiac arrhythmia, unspecified: Secondary | ICD-10-CM

## 2013-10-22 MED ORDER — BISOPROLOL FUMARATE 10 MG PO TABS: 10.0000 mg | ORAL_TABLET | Freq: Every day | ORAL | Status: DC

## 2013-10-22 NOTE — Telephone Encounter (Signed)
Refill- bisopro fum  [facility] [address]

## 2013-10-22 NOTE — Telephone Encounter (Signed)
30 day supply sent to pharmacy.  Pt was due for follow up in March. Pt no showed march appt and cancelled f/u on 7/15 and 10/01/13. Please call pt to arrange appt as further refills cannot be sent until he is seen in the office.

## 2013-10-24 NOTE — Telephone Encounter (Signed)
Informed patient of medication refill and he states that he will call our office to schedule appointment when he gets back into town

## 2013-11-07 ENCOUNTER — Encounter: Payer: Self-pay | Admitting: Gastroenterology

## 2013-11-14 ENCOUNTER — Encounter: Payer: Self-pay | Admitting: Family

## 2013-11-14 ENCOUNTER — Ambulatory Visit (INDEPENDENT_AMBULATORY_CARE_PROVIDER_SITE_OTHER): Payer: Medicare Other | Admitting: Family

## 2013-11-14 VITALS — BP 122/90 | HR 73 | Temp 97.8°F | Resp 18 | Ht 72.0 in | Wt 261.6 lb

## 2013-11-14 DIAGNOSIS — Z23 Encounter for immunization: Secondary | ICD-10-CM

## 2013-11-14 DIAGNOSIS — I499 Cardiac arrhythmia, unspecified: Secondary | ICD-10-CM

## 2013-11-14 DIAGNOSIS — I10 Essential (primary) hypertension: Secondary | ICD-10-CM

## 2013-11-14 DIAGNOSIS — G4733 Obstructive sleep apnea (adult) (pediatric): Secondary | ICD-10-CM

## 2013-11-14 DIAGNOSIS — I5022 Chronic systolic (congestive) heart failure: Secondary | ICD-10-CM

## 2013-11-14 DIAGNOSIS — R7309 Other abnormal glucose: Secondary | ICD-10-CM

## 2013-11-14 DIAGNOSIS — I428 Other cardiomyopathies: Secondary | ICD-10-CM

## 2013-11-14 DIAGNOSIS — R739 Hyperglycemia, unspecified: Secondary | ICD-10-CM

## 2013-11-14 DIAGNOSIS — J45909 Unspecified asthma, uncomplicated: Secondary | ICD-10-CM

## 2013-11-14 DIAGNOSIS — N259 Disorder resulting from impaired renal tubular function, unspecified: Secondary | ICD-10-CM

## 2013-11-14 MED ORDER — MONTELUKAST SODIUM 10 MG PO TABS: ORAL_TABLET | ORAL | Status: DC

## 2013-11-14 MED ORDER — LOSARTAN POTASSIUM 50 MG PO TABS: 50.0000 mg | ORAL_TABLET | Freq: Two times a day (BID) | ORAL | Status: DC

## 2013-11-14 MED ORDER — FLUTICASONE-SALMETEROL 250-50 MCG/DOSE IN AEPB: INHALATION_SPRAY | RESPIRATORY_TRACT | Status: DC

## 2013-11-14 MED ORDER — SILDENAFIL CITRATE 100 MG PO TABS: 100.0000 mg | ORAL_TABLET | Freq: Every day | ORAL | Status: DC | PRN

## 2013-11-14 MED ORDER — AMLODIPINE BESYLATE 10 MG PO TABS: ORAL_TABLET | ORAL | Status: DC

## 2013-11-14 MED ORDER — ALBUTEROL SULFATE HFA 108 (90 BASE) MCG/ACT IN AERS: 2.0000 | INHALATION_SPRAY | Freq: Four times a day (QID) | RESPIRATORY_TRACT | Status: DC | PRN

## 2013-11-14 MED ORDER — BISOPROLOL FUMARATE 10 MG PO TABS: 10.0000 mg | ORAL_TABLET | Freq: Every day | ORAL | Status: DC

## 2013-11-14 MED ORDER — AMLODIPINE BESYLATE 10 MG PO TABS
ORAL_TABLET | ORAL | Status: DC
Start: 2013-11-14 — End: 2013-11-16

## 2013-11-14 MED ORDER — SPIRONOLACTONE 25 MG PO TABS: ORAL_TABLET | ORAL | Status: DC

## 2013-11-14 MED ORDER — FUROSEMIDE 20 MG PO TABS: 30.0000 mg | ORAL_TABLET | Freq: Every day | ORAL | Status: DC

## 2013-11-14 MED ORDER — SERTRALINE HCL 50 MG PO TABS: 50.0000 mg | ORAL_TABLET | Freq: Every day | ORAL | Status: DC

## 2013-11-14 NOTE — Assessment & Plan Note (Signed)
Will obtain follow up BMET.

## 2013-11-14 NOTE — Patient Instructions (Signed)
Please complete lab work prior to leaving. Schedule a fasting medicare wellness visit at your convenience this fall.

## 2013-11-14 NOTE — Assessment & Plan Note (Signed)
Will obtain follow up sleep study.  Will then plan to order new machine/mask once settings are updated.  Pt is agreeable.

## 2013-11-14 NOTE — Progress Notes (Signed)
Subjective:    Patient ID: Brandon [name], male    DOB: September 23, 1940, 73 y.o.   MRN: [id number]  HPI  [name] is a 73 yr old male who presents today for follow up.  1) HTN- current bp meds inclurd amlodipine, lasix, losartan and aldactone.  Wt Readings from Last 3 Encounters:  11/14/13 261 lb 9.6 oz (118.661 kg)  04/06/13 262 lb (118.842 kg)  02/13/13 262 lb 1.3 oz (118.879 kg)    BP Readings from Last 3 Encounters:  11/14/13 122/90  04/06/13 130/84  02/13/13 150/78   2) Asthma- on albuterol, singulair and advair. Reports that he uses his albuterol occasionally (such as after mowing the lawn).  3) CHF- he is maintained on lasix, losartan and aldactone. He last saw his cardiologist Dr. Antoine Poche 07/03/12.  Denies DOE.  4) Renal insufficiency-   Lab Results  Component Value Date   CREATININE 1.43* 02/13/2013   5) OSA- Needs a new mask, has not used recently.  Machine is 73 years old. Sleep study was back then.  Naps during the day.  Review of Systems See HPI   Past Medical History  Diagnosis Date  . Cardiomyopathy     with a negative cardiac catheterization in the past. (EF appriximately 40-45%)   . HTN (hypertension)     x 30 years  . Obstructive sleep apnea (adult) (pediatric)   . Chronic systolic heart failure   . 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 Obesity, unspecified   . Asthma     Hx of childhood asthma, disappeared for a while, then resurfaced 6-7 years ago.     History   Social History  . Marital Status: Married    Spouse Name: [name]    Number of Children: 3  . Years of Education: N/A   Occupational History  . retired    Social History Main Topics  . Smoking status: Former Games developer  . Smokeless tobacco: Never Used     Comment: quit smoling in 1990. started when 18, 1 ppd  . Alcohol Use: Yes     Comment: occasionaly   . Drug Use: Not on file  . Sexual Activity: Not on file   Other Topics Concern  . Not on file    Social History Narrative   Grew up in [place], attended [employer]. First wife died of breast ca in 07/27/1997. 3 children. Remarried- 8 years. 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    Pt signed designated party release granting access to [facility] to his wife [name]. Detailed message may be left on home or cell phone. Roselle Locus August 13, 2009 11:34 am.     Past Surgical History  Procedure Laterality Date  . None      Family History  Problem Relation Age of Onset  . Cancer Father     multiple melanoma  . Lupus Sister   . Asthma Daughter     Allergies  Allergen Reactions  . Ace Inhibitors     REACTION: cough  . Isosorb Dinitrate-Hydralazine     REACTION: dizziness/hypotensive    Current Outpatient Prescriptions on File Prior to Visit  Medication Sig Dispense Refill  . albuterol (PROVENTIL) (2.5 MG/3ML) 0.083% nebulizer solution Take 3 mLs (2.5 mg total) by nebulization every 6 (six) hours as needed for wheezing.  75 mL  5  . aspirin 81 MG tablet Take 81 mg by mouth daily.  No current facility-administered medications on file prior to visit.    BP 122/90  Pulse 73  Temp(Src) 97.8 F (36.6 C) (Oral)  Resp 18  Ht 6' (1.829 m)  Wt 261 lb 9.6 oz (118.661 kg)  BMI 35.47 kg/m2  SpO2 96%       Objective:   Physical Exam  Constitutional: He is oriented to person, place, and time. He appears well-developed and well-nourished. No distress.  HENT:  Head: Normocephalic and atraumatic.  Cardiovascular: Normal rate and regular rhythm.   No murmur heard. Pulmonary/Chest: Effort normal and breath sounds normal. No respiratory distress. He has no wheezes. He has no rales. He exhibits no tenderness.  Musculoskeletal: He exhibits no edema.  Neurological: He is alert and oriented to person, place, and time.  Psychiatric: He has a normal mood and affect. His behavior is normal. Thought content normal.          Assessment & Plan:

## 2013-11-14 NOTE — Assessment & Plan Note (Signed)
Clinically stable on current meds. Pt advised to arrange follow up with Dr. Antoine Poche.

## 2013-11-14 NOTE — Assessment & Plan Note (Signed)
Stable on current meds 

## 2013-11-14 NOTE — Assessment & Plan Note (Signed)
Blood pressure is stable on current meds. Continue same.

## 2013-11-14 NOTE — Progress Notes (Signed)
Pre visit review using our clinic review tool, if applicable. No additional management support is needed unless otherwise documented below in the visit note. 

## 2013-11-15 ENCOUNTER — Encounter: Payer: Self-pay | Admitting: Family

## 2013-11-15 LAB — BASIC METABOLIC PANEL
BUN: 19 mg/dL (ref 6–23)
CO2: 26 mEq/L (ref 19–32)
Calcium: 9.7 mg/dL (ref 8.4–10.5)
Chloride: 104 mEq/L (ref 96–112)
Creatinine, Ser: 1.5 mg/dL (ref 0.4–1.5)
GFR: 59.02 mL/min — ABNORMAL LOW (ref >60.00)
Glucose, Bld: 87 mg/dL (ref 70–99)
POTASSIUM: 4.8 meq/L (ref 3.5–5.1)
SODIUM: 135 meq/L (ref 135–145)

## 2013-11-15 LAB — HEMOGLOBIN A1C: HEMOGLOBIN A1C: 6.1 % (ref 4.6–6.5)

## 2013-11-16 ENCOUNTER — Other Ambulatory Visit: Payer: Self-pay | Admitting: Family

## 2013-11-16 ENCOUNTER — Other Ambulatory Visit: Payer: Self-pay | Admitting: Cardiology

## 2013-11-19 ENCOUNTER — Telehealth: Payer: Self-pay | Admitting: Family

## 2013-11-19 NOTE — Telephone Encounter (Signed)
Did he complete lab work on 9/2?  I do not see results.  He should return for labs please.

## 2013-11-20 NOTE — Telephone Encounter (Signed)
Orders are in EPIC twice on 11/14/13. One set has results attached.  Please advise.

## 2013-11-20 NOTE — Telephone Encounter (Signed)
Lab work noted.

## 2013-12-27 ENCOUNTER — Other Ambulatory Visit: Payer: Self-pay | Admitting: Family

## 2013-12-28 NOTE — Telephone Encounter (Signed)
Rx request to pharmacy/SLS  

## 2014-01-09 ENCOUNTER — Ambulatory Visit: Payer: Medicare Other | Admitting: Family

## 2014-02-18 ENCOUNTER — Other Ambulatory Visit: Payer: Self-pay | Admitting: Family

## 2014-03-26 ENCOUNTER — Encounter: Payer: Self-pay | Admitting: Cardiology

## 2014-03-26 ENCOUNTER — Ambulatory Visit (INDEPENDENT_AMBULATORY_CARE_PROVIDER_SITE_OTHER): Payer: Medicare Other | Admitting: Cardiology

## 2014-03-26 VITALS — BP 162/90 | HR 61 | Ht 72.0 in | Wt 270.4 lb

## 2014-03-26 DIAGNOSIS — I429 Cardiomyopathy, unspecified: Secondary | ICD-10-CM

## 2014-03-26 DIAGNOSIS — I428 Other cardiomyopathies: Secondary | ICD-10-CM

## 2014-03-26 DIAGNOSIS — I10 Essential (primary) hypertension: Secondary | ICD-10-CM

## 2014-03-26 NOTE — Progress Notes (Signed)
HPI The patient presents for followup of known cardiomyopathy. Since I last saw him he has done OK.  The patient denies any new symptoms such as chest discomfort, neck or arm discomfort. There has been no new shortness of breath, PND or orthopnea. There have been no reported palpitations, presyncope or syncope.  He is not exercising as I would like. He has gained weight.  However, with his level of activity he is not having any new symptoms.  He is scheduled for a sleep study to make sure that his current CPAP is OK.    Allergies  Allergen Reactions  . Ace Inhibitors     REACTION: cough  . Isosorb Dinitrate-Hydralazine     REACTION: dizziness/hypotensive    Current Outpatient Prescriptions  Medication Sig Dispense Refill  . albuterol (PROAIR HFA) 108 (90 BASE) MCG/ACT inhaler Inhale 2 puffs into the lungs every 6 (six) hours as needed for wheezing or shortness of breath. 18 g 0  . albuterol (PROVENTIL) (2.5 MG/3ML) 0.083% nebulizer solution Take 3 mLs (2.5 mg total) by nebulization every 6 (six) hours as needed for wheezing. 75 mL 5  . amLODipine (NORVASC) 10 MG tablet Take 1 tablet (10 mg total) by mouth daily. 30 tablet 3  . aspirin 81 MG tablet Take 81 mg by mouth daily.      . bisoprolol (ZEBETA) 10 MG tablet Take 1 tablet (10 mg total) by mouth daily. 30 tablet 0  . Fluticasone-Salmeterol (ADVAIR DISKUS) 250-50 MCG/DOSE AEPB INHALE ONE DOSE BY MOUTH TWICE DAILY 60 each 0  . furosemide (LASIX) 20 MG tablet Take 1.5 tablets (30 mg total) by mouth daily. (Patient taking differently: Take 30 mg by mouth as needed. ) 45 tablet 0  . losartan (COZAAR) 50 MG tablet Take 1 tablet (50 mg total) by mouth 2 (two) times daily. 60 tablet 0  . montelukast (SINGULAIR) 10 MG tablet TAKE ONE TABLET BY MOUTH ONCE DAILY AT BEDTIME 30 tablet 0  . sertraline (ZOLOFT) 50 MG tablet Take 1 tablet (50 mg total) by mouth daily. 90 tablet 1  . sildenafil (VIAGRA) 100 MG tablet Take 1 tablet (100 mg total) by  mouth daily as needed for erectile dysfunction. 6 tablet 0  . spironolactone (ALDACTONE) 25 MG tablet Take 1 tablet (25 mg total) by mouth daily. DUE FOR APPT WITH [name]. [id number]. 30 tablet 0   No current facility-administered medications for this visit.    Past Medical History  Diagnosis Date  . Cardiomyopathy     with a negative cardiac catheterization in the past. (EF appriximately 40-45%)   . HTN (hypertension)     x 30 years  . Sleep apnea     CPAP  . 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 Obesity, unspecified   . Asthma     Hx of childhood asthma, disappeared for a while, then resurfaced 6-7 years ago.     Past Surgical History  Procedure Laterality Date  . None      ROS:  As stated in the HPI and negative for all other systems.  PHYSICAL EXAM BP 162/90 mmHg  Pulse 61  Ht 6' (1.829 m)  Wt 270 lb 6.4 oz (122.653 kg)  BMI 36.66 kg/m2 GENERAL:  Well appearing HEENT:  Pupils equal round and reactive, fundi not visualized, oral mucosa unremarkable NECK:  No jugular venous distention, waveform within normal limits, carotid upstroke brisk and symmetric, no bruits, no thyromegaly LUNGS:  Clear to auscultation bilaterally  BACK:  No CVA tenderness CHEST:  Unremarkable HEART:  PMI not displaced or sustained,S1 and S2 within normal limits, no S3, no S4, no clicks, no rubs, no murmurs ABD:  Flat, positive bowel sounds normal in frequency in pitch, no bruits, no rebound, no guarding, no midline pulsatile mass, no hepatomegaly, no splenomegaly, obese EXT:  2 plus pulses throughout, trace edema, no cyanosis no clubbing  EKG:  Sinus rhythm, rate 61, axis within normal limits, intervals within normal limits, no acute ST-T wave changes, nonspecific T-wave flattening and inferior T wave inversion unchanged from previous. PACs. 03/26/2014   ASSESSMENT AND PLAN  CARDIOMYOPATHY -  He seems to be euvolemic. He will continue the meds as listed. I will followup  with an echocardiogram.  HYPERTENSION -  His blood pressure is elevated today but hasn't been at the last two readings. I gave him instruction to keep a blood pressure diary. We also talked about weight loss and exercise.  OBSTRUCTIVE SLEEP APNEA -  He is wearing the CPAP every night and is scheduled for followup sleep study..    OBESITY, UNSPECIFIED -  We had a another long discussion about weight loss and I again gave him specifics on diet and exercise.    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 No change in therapy is planned.

## 2014-03-26 NOTE — Patient Instructions (Signed)
Dr Antoine Poche has ordered the following test(s) to be done: 1. Echocardiogram - Your physician has requested that you have an echocardiogram. Echocardiography is a painless test that uses sound waves to create images of your heart. It provides your doctor with information about the size and shape of your heart and how well your heart's chambers and valves are working. This procedure takes approximately one hour. There are no restrictions for this procedure.  Your physician has recommended that you get a blood pressure cuff/machine to fit your arm, you can bring it here to make sure the machine is accurate. Take your blood pressure at random times throughout the day and keep a diary of your recordings.  Dr Antoine Poche wants you to follow-up in 1 year. You will receive a reminder letter in the mail one months in advance. If you don't receive a letter, please call our office to schedule the follow-up appointment.

## 2014-04-04 ENCOUNTER — Inpatient Hospital Stay (HOSPITAL_COMMUNITY): Admission: RE | Admit: 2014-04-04 | Payer: Medicare Other | Source: Ambulatory Visit

## 2014-04-15 ENCOUNTER — Other Ambulatory Visit: Payer: Self-pay | Admitting: Family

## 2014-04-15 NOTE — Telephone Encounter (Signed)
Refill sent, but pt is due for appointment.

## 2014-04-16 ENCOUNTER — Ambulatory Visit (HOSPITAL_BASED_OUTPATIENT_CLINIC_OR_DEPARTMENT_OTHER): Payer: Medicare Other | Attending: Family

## 2014-04-16 VITALS — Ht 72.0 in | Wt 267.0 lb

## 2014-04-16 DIAGNOSIS — G4733 Obstructive sleep apnea (adult) (pediatric): Secondary | ICD-10-CM | POA: Diagnosis not present

## 2014-04-17 NOTE — Telephone Encounter (Signed)
Left detailed message informing patient of this. °

## 2014-04-24 DIAGNOSIS — G4730 Sleep apnea, unspecified: Secondary | ICD-10-CM

## 2014-04-24 NOTE — Sleep Study (Signed)
[facility]  NAME: Brandon [name]  DATE OF BIRTH: 28-Mar-1940  MEDICAL RECORD NUMBER [id number]  LOCATION: [facility]  PHYSICIAN: Holmes Hays V.  DATE OF STUDY:04/24/2014   SLEEP STUDY TYPE: Split Polysomnogram               REFERRING PHYSICIAN: Oretha Milch, MD  INDICATION FOR STUDY: 74 year old man with obstructive sleep apnea .PSG in 02/2008 showed moderate to severe OSA with AHI 28 per hour. This was corrected on a subsequent titration study in 05/2008 with C Pap of 15 cm, medium fullface mask . This study was ordered for re-titration .At the time of this study ,they weighed 267 pounds with a height of  6 ft 0 inches and the BMI of 36, neck size of 17.5 inches. Epworth sleepiness score was 13   This intervention polysomnogram was performed with a sleep technologist in attendance. EEG, EOG,EMG and respiratory parameters recorded. Sleep stages, arousals, limb movements and respiratory data was scored according to criteria laid out by the American Academy of sleep medicine.  SLEEP ARCHITECTURE: Lights out was at 20-36 PM and lights on was at 528 AM. During the baseline portion ,total sleep time was 126 minutes with sleep latency of 33 minutes with latency to REM sleep of 27 minutes and wake after sleep onset of 31 minutes. Sleep efficiency was poor at 66 with frequent long periods of awakening. Sleep stages as a percentage of total sleep time was N1 17 N2- 78 % and REM sleep 5 % ( 6 minutes) . During titration REM sleep accounted for 80 minutes and supine sleep was noted .The longest period of REM sleep was around 4 AM.   AROUSAL DATA : At baseline ,there were 45 arousals with an arousal index of 21 events per hour. Of these 22 were spontaneous, and 23 were associated with respiratory events and 0 were associated periodic limb movements. During titration, the arousal index was 9  events per hour  RESPIRATORY DATA: During the baseline portion,there  were 1 obstructive apneas, 0 central apneas, 0 mixed apneas and 51 hypopneas with apnea -hypopnea index of 25 events per hour.  There was no relation to sleep stage or body position. Due to this degree of respiratory disturbance CPAP was initiated at 5 cm and titrated to find a level of 13 cm due to persistent events during supine sleep. At the level of 13 cm for 39 minutes of sleep, 0 apneas and 1 hypopneas is were noted. Titration was optimal .  MOVEMENT/PARASOMNIA: There were 0 PLMS with a PLM index of 0 events per hour. The PLM arousal index was 0 events per hour.   OXYGEN DATA: The lowest desaturation was 89 % during non-REM sleep and the desaturation index was 34 per hour. This was corrected by titration and the saturations stayed below 88% for 0.1 minutes during titration.  CARDIAC DATA: The low heart rate was 36 beats per minute. The high heart rate recorded was an artifact. No arrhythmias were noted   IMPRESSION :  1. Moderate obstructive sleep apnea with hypopneas causing sleep fragmentation and mild oxygen desaturation. 2. This was corrected by CPAP of 13 centimeters with large Fisher-Paykel fullface mask. Titration was optimal 3. No evidence of cardiac arrhythmias or behavioral disturbance during sleep. Periodic limb movements were not noted.  RECOMMENDATION:    1. The treatment options for this degree of sleep disordered breathing includes weight loss and CPAP therapy. 2. CPAP can be initiated  at 13 centimeters with a large Fisher-Paykel fullface mask and compliance monitored at this level. 3. Patient should be cautioned against driving when sleepy.They should be asked to avoid medications with sedative side effects  Oretha Milch MD Diplomate, American Board of Sleep Medicine  ELECTRONICALLY SIGNED ON: 04/24/2014  [facility] PH: [phone]   FX: [phone] ACCREDITED BY THE AMERICAN ACADEMY OF SLEEP MEDICINE

## 2014-04-28 ENCOUNTER — Telehealth: Payer: Self-pay | Admitting: Family

## 2014-04-28 DIAGNOSIS — G4733 Obstructive sleep apnea (adult) (pediatric): Secondary | ICD-10-CM

## 2014-04-28 NOTE — Telephone Encounter (Signed)
Reviewed sleep study. It shows OSA.  I recommend he start CPAP.  Follow up in our  Office 6 weeks after starting cpap, referral pended for home dme to supply cpap.

## 2014-04-29 ENCOUNTER — Telehealth: Payer: Self-pay | Admitting: Family

## 2014-04-29 NOTE — Telephone Encounter (Signed)
Pt called back stating he already has  CPAP machine at home that he has used for the last 5-6 years and thinks it is at "full capacity". States sleep study was supposed to confirm settings / adjustments if needed. Has resp med equipment and has used [facility] for filters etc.. . . .  In the past.

## 2014-04-29 NOTE — Telephone Encounter (Signed)
Caller name: [place], Yahshua Relation to pt: SELF  Call back number: [phone] Pharmacy:  Reason for call:  PT would like to discuss sleep study

## 2014-04-29 NOTE — Telephone Encounter (Signed)
See previous phone note of 04/28/14.

## 2014-04-29 NOTE — Telephone Encounter (Signed)
Left detailed message on voicemail re: below recommendation and to call if any questions.

## 2014-04-30 ENCOUNTER — Ambulatory Visit (HOSPITAL_COMMUNITY)
Admission: RE | Admit: 2014-04-30 | Discharge: 2014-04-30 | Disposition: A | Discharge status: Home / Self Care | Payer: Medicare Other | Source: Ambulatory Visit | Attending: Cardiology | Admitting: Cardiology

## 2014-04-30 DIAGNOSIS — I10 Essential (primary) hypertension: Secondary | ICD-10-CM | POA: Insufficient documentation

## 2014-04-30 DIAGNOSIS — Z87891 Personal history of nicotine dependence: Secondary | ICD-10-CM | POA: Diagnosis not present

## 2014-04-30 DIAGNOSIS — I429 Cardiomyopathy, unspecified: Secondary | ICD-10-CM | POA: Diagnosis not present

## 2014-04-30 DIAGNOSIS — I428 Other cardiomyopathies: Secondary | ICD-10-CM

## 2014-04-30 DIAGNOSIS — Z8249 Family history of ischemic heart disease and other diseases of the circulatory system: Secondary | ICD-10-CM | POA: Insufficient documentation

## 2014-04-30 NOTE — Telephone Encounter (Signed)
Is this something you have help with?

## 2014-04-30 NOTE — Telephone Encounter (Signed)
Brandon [name] or [name], could you please contact [facility] and request setting/mask as placed in referral.  I can fill out their form if necessary.

## 2014-04-30 NOTE — Progress Notes (Signed)
2D Echocardiogram Complete.  04/30/2014   Brandon [name], RDCS

## 2014-04-30 NOTE — Telephone Encounter (Signed)
Sent to [facility]/awaiting to hear back

## 2014-05-19 ENCOUNTER — Other Ambulatory Visit: Payer: Self-pay | Admitting: Family

## 2014-05-20 NOTE — Telephone Encounter (Signed)
Last filled:  04/15/14 Amt: 30, 0 Last OV:  11/14/13  Next appt scheduled 05/29/14 @ 10:45 am.    Med filled x 1 month.

## 2014-05-29 ENCOUNTER — Telehealth: Payer: Self-pay | Admitting: Family

## 2014-05-29 ENCOUNTER — Ambulatory Visit (INDEPENDENT_AMBULATORY_CARE_PROVIDER_SITE_OTHER): Payer: Medicare Other | Admitting: Family

## 2014-05-29 ENCOUNTER — Encounter: Payer: Self-pay | Admitting: Family

## 2014-05-29 VITALS — BP 138/80 | HR 60 | Temp 98.1°F | Resp 18 | Ht 72.0 in | Wt 266.4 lb

## 2014-05-29 DIAGNOSIS — E785 Hyperlipidemia, unspecified: Secondary | ICD-10-CM

## 2014-05-29 DIAGNOSIS — G4733 Obstructive sleep apnea (adult) (pediatric): Secondary | ICD-10-CM

## 2014-05-29 DIAGNOSIS — I10 Essential (primary) hypertension: Secondary | ICD-10-CM

## 2014-05-29 DIAGNOSIS — J4540 Moderate persistent asthma, uncomplicated: Secondary | ICD-10-CM

## 2014-05-29 DIAGNOSIS — R739 Hyperglycemia, unspecified: Secondary | ICD-10-CM

## 2014-05-29 LAB — BASIC METABOLIC PANEL
BUN: 25 mg/dL — ABNORMAL HIGH (ref 6–23)
CALCIUM: 9.2 mg/dL (ref 8.4–10.5)
CHLORIDE: 106 meq/L (ref 96–112)
CO2: 25 mEq/L (ref 19–32)
Creatinine, Ser: 1.56 mg/dL — ABNORMAL HIGH (ref 0.40–1.50)
GFR: 56.32 mL/min — ABNORMAL LOW (ref >60.00)
Glucose, Bld: 100 mg/dL — ABNORMAL HIGH (ref 70–99)
Potassium: 4.2 mEq/L (ref 3.5–5.1)
SODIUM: 135 meq/L (ref 135–145)

## 2014-05-29 LAB — LIPID PANEL
CHOL/HDL RATIO: 4
Cholesterol: 157 mg/dL (ref 0–200)
HDL: 42.9 mg/dL (ref >39.00)
LDL Cholesterol: 100 mg/dL — ABNORMAL HIGH (ref 0–99)
NONHDL: 114.1
Triglycerides: 72 mg/dL (ref 0.0–149.0)
VLDL: 14.4 mg/dL (ref 0.0–40.0)

## 2014-05-29 MED ORDER — FLUTICASONE-SALMETEROL 500-50 MCG/DOSE IN AEPB: 1.0000 | INHALATION_SPRAY | Freq: Two times a day (BID) | RESPIRATORY_TRACT | Status: DC

## 2014-05-29 MED ORDER — SPIRONOLACTONE 25 MG PO TABS: 25.0000 mg | ORAL_TABLET | Freq: Every day | ORAL | Status: DC

## 2014-05-29 MED ORDER — ALBUTEROL SULFATE (2.5 MG/3ML) 0.083% IN NEBU: 2.5000 mg | INHALATION_SOLUTION | Freq: Four times a day (QID) | RESPIRATORY_TRACT | Status: DC | PRN

## 2014-05-29 NOTE — Progress Notes (Signed)
Subjective:    Patient ID: Brandon [name], male    DOB: 1940-12-19, 74 y.o.   MRN: [id number]  HPI  Pt presents for follow up.  Patient is currently maintained on the following medications for blood pressure: losartan, bisoprolol, norvasc. Patient reports good compliance with blood pressure medications. Patient denies chest pain, shortness of breath or swelling. Last 3 blood pressure readings in our office are as follows: BP Readings from Last 3 Encounters:  05/29/14 138/80  03/26/14 162/90  11/14/13 122/90   130-140 systolic when he checks at [facility]  Hyperglycemia-   Lab Results  Component Value Date   HGBA1C 6.1 11/14/2013   OSA- completed follow up sleep study. Using  CPAP.  Has not been contacted by [facility] but notes that he was not happy with their service in the past.   Asthma- reports that he is using albuterol more frequently over the last 6-7 months. Using at least once a day depending on activity. Using advair but not using nebulizers.  + compliance with singulair.  Notes 4-5 days of nausea which resolved.    Review of Systems     Past Medical History  Diagnosis Date  . Cardiomyopathy     with a negative cardiac catheterization in the past. (EF appriximately 40-45%)   . HTN (hypertension)     x 30 years  . Sleep apnea     CPAP  . 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 Obesity, unspecified   . Asthma     Hx of childhood asthma, disappeared for a while, then resurfaced 6-7 years ago.     History   Social History  . Marital Status: Married    Spouse Name: [name]  . Number of Children: 3  . Years of Education: N/A   Occupational History  . retired    Social History Main Topics  . Smoking status: Former Games developer  . Smokeless tobacco: Never Used     Comment: quit smoling in 1990. started when 18, 1 ppd  . Alcohol Use: Yes     Comment: occasionaly   . Drug Use: Not on file  . Sexual Activity: Not on file   Other Topics  Concern  . Not on file   Social History Narrative   Grew up in [place], attended [employer]. First wife died of breast ca in 07/23/1997. 3 children. Remarried- 8 years. 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    Pt signed designated party release granting access to [facility] to his wife [name]. Detailed message may be left on home or cell phone. Roselle Locus August 13, 2009 11:34 am.     Past Surgical History  Procedure Laterality Date  . None      Family History  Problem Relation Age of Onset  . Cancer Father     multiple melanoma  . Lupus Sister   . Asthma Daughter     Allergies  Allergen Reactions  . Ace Inhibitors     REACTION: cough  . Isosorb Dinitrate-Hydralazine     REACTION: dizziness/hypotensive    Current Outpatient Prescriptions on File Prior to Visit  Medication Sig Dispense Refill  . albuterol (PROAIR HFA) 108 (90 BASE) MCG/ACT inhaler Inhale 2 puffs into the lungs every 6 (six) hours as needed for wheezing or shortness of breath. 18 g 0  . albuterol (PROVENTIL) (2.5 MG/3ML) 0.083% nebulizer solution Take 3 mLs (2.5 mg total) by nebulization every 6 (six) hours as needed  for wheezing. 75 mL 5  . amLODipine (NORVASC) 10 MG tablet Take 1 tablet (10 mg total) by mouth daily. 30 tablet 3  . aspirin 81 MG tablet Take 81 mg by mouth daily.      . bisoprolol (ZEBETA) 10 MG tablet Take 1 tablet (10 mg total) by mouth daily. 30 tablet 0  . Fluticasone-Salmeterol (ADVAIR DISKUS) 250-50 MCG/DOSE AEPB INHALE ONE DOSE BY MOUTH TWICE DAILY 60 each 0  . furosemide (LASIX) 20 MG tablet Take 1.5 tablets (30 mg total) by mouth daily. (Patient taking differently: Take 30 mg by mouth as needed. ) 45 tablet 0  . losartan (COZAAR) 50 MG tablet Take 1 tablet (50 mg total) by mouth 2 (two) times daily. 60 tablet 0  . montelukast (SINGULAIR) 10 MG tablet TAKE ONE TABLET BY MOUTH ONCE DAILY AT BEDTIME 30 tablet 0  . sertraline (ZOLOFT) 50 MG tablet Take 1 tablet (50 mg  total) by mouth daily. 90 tablet 1  . sildenafil (VIAGRA) 100 MG tablet Take 1 tablet (100 mg total) by mouth daily as needed for erectile dysfunction. 6 tablet 0  . spironolactone (ALDACTONE) 25 MG tablet TAKE ONE TABLET BY MOUTH ONCE DAILY. PATIENT NEEDS AN APPOINTMENT 30 tablet 0   No current facility-administered medications on file prior to visit.    BP 138/80 mmHg  Pulse 60  Temp(Src) 98.1 F (36.7 C) (Oral)  Resp 18  Ht 6' (1.829 m)  Wt 266 lb 6.4 oz (120.838 kg)  BMI 36.12 kg/m2  SpO2 96%    Objective:   Physical Exam  Constitutional: He is oriented to person, place, and time. He appears well-developed and well-nourished. No distress.  HENT:  Head: Normocephalic and atraumatic.  Cardiovascular: Normal rate and regular rhythm.   No murmur heard. Pulmonary/Chest: Effort normal and breath sounds normal. No respiratory distress. He has no wheezes. He has no rales.  Musculoskeletal: He exhibits no edema.  Neurological: He is alert and oriented to person, place, and time.  Skin: Skin is warm and dry.  Psychiatric: He has a normal mood and affect. His behavior is normal. Thought content normal.          Assessment & Plan:

## 2014-05-29 NOTE — Telephone Encounter (Signed)
Pt has not been contacted by [facility] for CPAP. He does not wish to use [facility]. Would like [facility].  Could you please initiate?

## 2014-05-29 NOTE — Assessment & Plan Note (Signed)
Discussed diabetic diet.  Obtain follow up a1c.

## 2014-05-29 NOTE — Assessment & Plan Note (Signed)
Blood pressure is stable,continue current meds.

## 2014-05-29 NOTE — Patient Instructions (Signed)
Please complete lab work prior to leaving. Let me know if you have not heard back from [facility] 1 week re: CPAP supplies. Follow up in 3 months.

## 2014-05-29 NOTE — Assessment & Plan Note (Signed)
Requests referral to [facility]. Will initiate.

## 2014-05-29 NOTE — Telephone Encounter (Signed)
Sent referral message to [facility], awaiting to hear back from them.

## 2014-05-29 NOTE — Assessment & Plan Note (Signed)
Increase advair from 250 to 500. If not improved, consider pulmonary referral.continue albuterol prn and singulair.

## 2014-05-29 NOTE — Progress Notes (Signed)
Pre visit review using our clinic review tool, if applicable. No additional management support is needed unless otherwise documented below in the visit note. 

## 2014-06-12 ENCOUNTER — Telehealth: Payer: Self-pay | Admitting: Family

## 2014-06-12 NOTE — Telephone Encounter (Signed)
Yes [facility] has handle this

## 2014-06-12 NOTE — Telephone Encounter (Signed)
Caller name:Jane Relation to pt: self Call back number: [phone] Pharmacy:  Reason for call:   Patient states that he is having R knee pain. Offered patient appointment for tomorrow but patient declined stating that he cannot even get in the car to come up here. He wants to know if there is anything that he can take OTC? Swollen.

## 2014-06-12 NOTE — Telephone Encounter (Signed)
Saw this on my desktop, did they get back to you?

## 2014-06-12 NOTE — Telephone Encounter (Signed)
Notified pt and he voices understanding. Thinks he can get someone to drive him to [facility] tonight but will call us tomorrow if he is unable to be evaluated tonight.

## 2014-06-12 NOTE — Telephone Encounter (Signed)
Spoke with pt. Pain and swelling started Sunday evening.  Had slight fall the week before but did not land on knee; caught himself with his hands. Has been elevating leg and alternating heat and ice. States this makes it feel some better but continues to have swelling. Denies redness or heat to touch, no shortness of breath and no recent long car rides. Advised pt he needs appt for further evaluation. Pt states he was unable to drive today because knee is swollen and was too painful to bend to get into car.  Please advise if pt should come to office or ER for evaluation?

## 2014-06-12 NOTE — Telephone Encounter (Signed)
Pt needs to see ortho/sports med for swollen knee.  If unable to get appt w/ ortho, can go to Ortho urgent office at [facility] on [facility] [address] [facility] starting tonight at 5pm.  Otherwise, we can put in referral for him to be seen tomorrow.

## 2014-06-14 ENCOUNTER — Ambulatory Visit (INDEPENDENT_AMBULATORY_CARE_PROVIDER_SITE_OTHER): Payer: Medicare Other | Admitting: Physician Assistant

## 2014-06-14 ENCOUNTER — Encounter: Payer: Self-pay | Admitting: Physician Assistant

## 2014-06-14 ENCOUNTER — Ambulatory Visit (HOSPITAL_BASED_OUTPATIENT_CLINIC_OR_DEPARTMENT_OTHER)
Admission: RE | Admit: 2014-06-14 | Discharge: 2014-06-14 | Disposition: A | Discharge status: Home / Self Care | Payer: Medicare Other | Source: Ambulatory Visit | Attending: Physician Assistant | Admitting: Physician Assistant

## 2014-06-14 VITALS — BP 126/73 | HR 58 | Temp 98.5°F | Resp 16 | Ht 72.0 in | Wt 261.2 lb

## 2014-06-14 DIAGNOSIS — M25461 Effusion, right knee: Secondary | ICD-10-CM

## 2014-06-14 DIAGNOSIS — I10 Essential (primary) hypertension: Secondary | ICD-10-CM | POA: Diagnosis not present

## 2014-06-14 DIAGNOSIS — M25569 Pain in unspecified knee: Secondary | ICD-10-CM

## 2014-06-14 DIAGNOSIS — M7989 Other specified soft tissue disorders: Secondary | ICD-10-CM | POA: Diagnosis not present

## 2014-06-14 DIAGNOSIS — M25469 Effusion, unspecified knee: Secondary | ICD-10-CM | POA: Insufficient documentation

## 2014-06-14 DIAGNOSIS — M25561 Pain in right knee: Secondary | ICD-10-CM

## 2014-06-14 LAB — CBC
HCT: 40.1 % (ref 39.0–52.0)
HEMOGLOBIN: 13.7 g/dL (ref 13.0–17.0)
MCH: 31.3 pg (ref 26.0–34.0)
MCHC: 34.2 g/dL (ref 30.0–36.0)
MCV: 91.6 fL (ref 78.0–100.0)
MPV: 11 fL (ref 8.6–12.4)
Platelets: 215 10*3/uL (ref 150–400)
RBC: 4.38 MIL/uL (ref 4.22–5.81)
RDW: 13.8 % (ref 11.5–15.5)
WBC: 6.8 10*3/uL (ref 4.0–10.5)

## 2014-06-14 IMAGING — CR DG KNEE COMPLETE 4+V*R*
4 series · 4 of 4 positions shown · non-contrast
Comparison: None.

CLINICAL DATA: Five-day history of atraumatic right patellar and
anterior knee pain and swelling

EXAM:
RIGHT KNEE - COMPLETE 4+ VIEW

[w knee ap right * (1 of 2)]
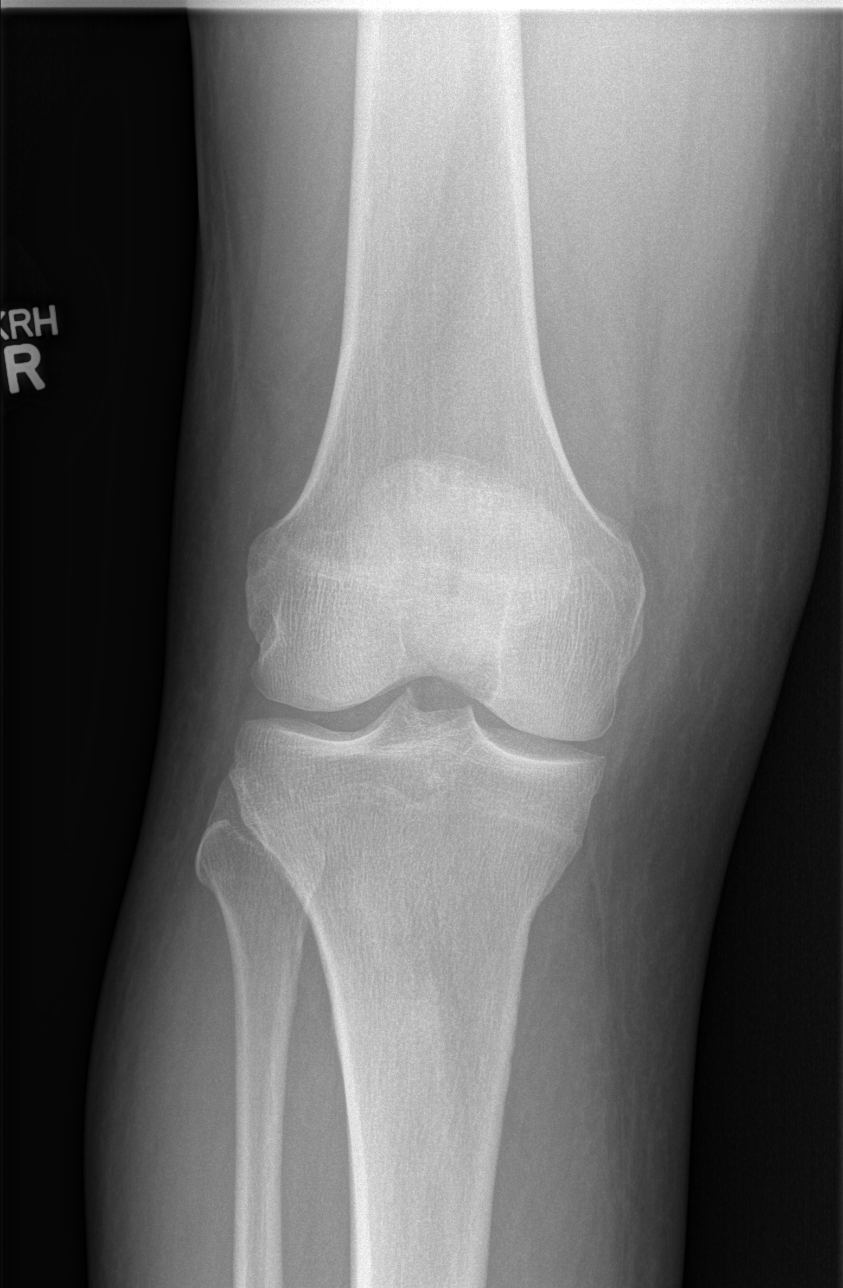

[w knee lat. right *]
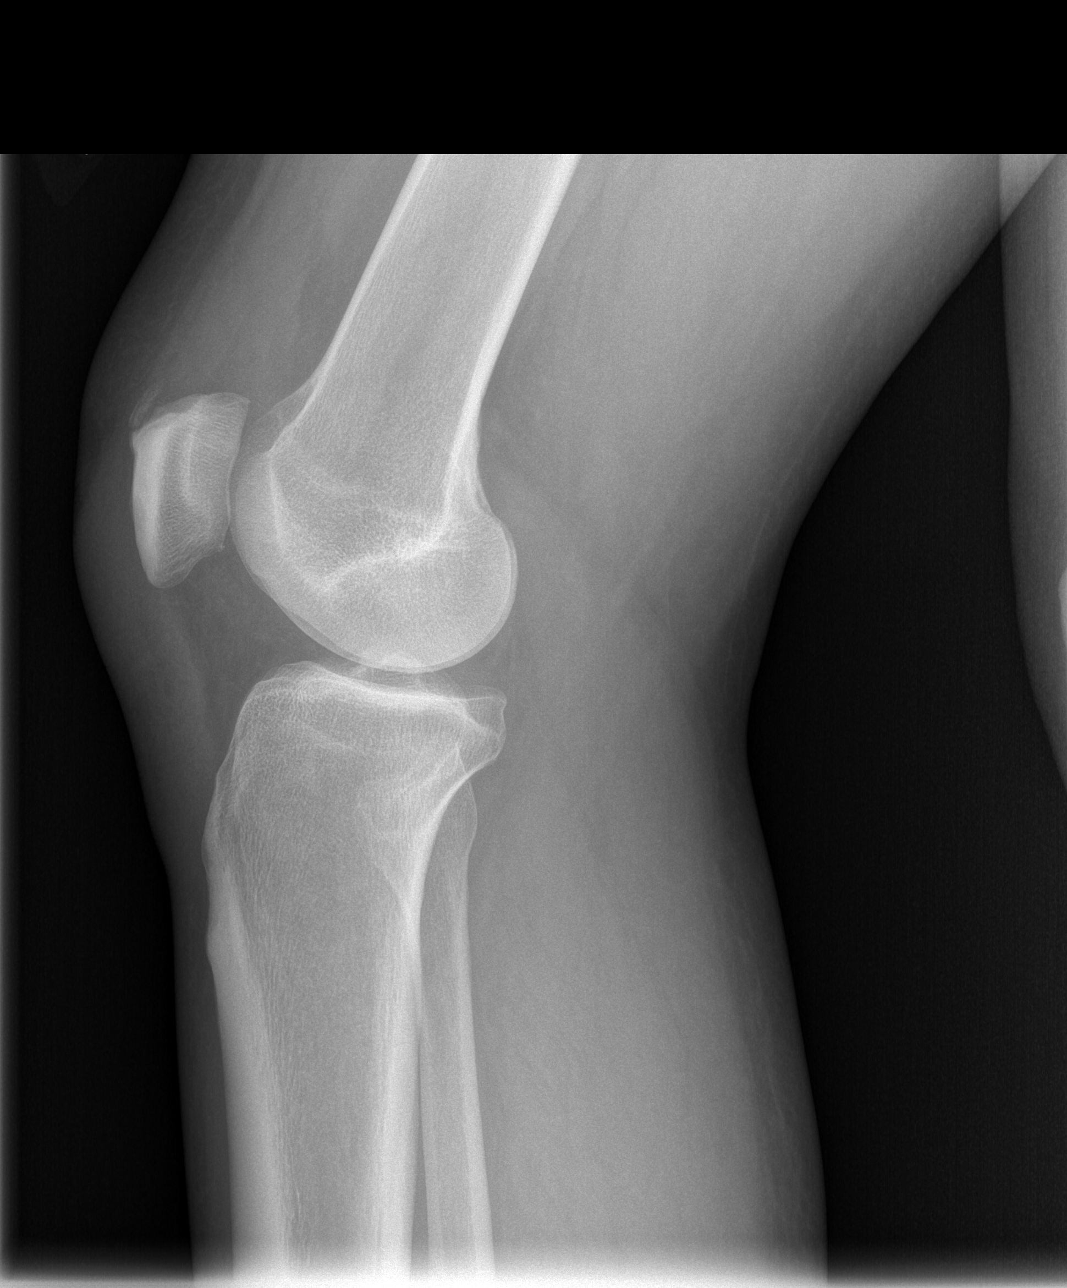

[w knee ap right * (2 of 2)]
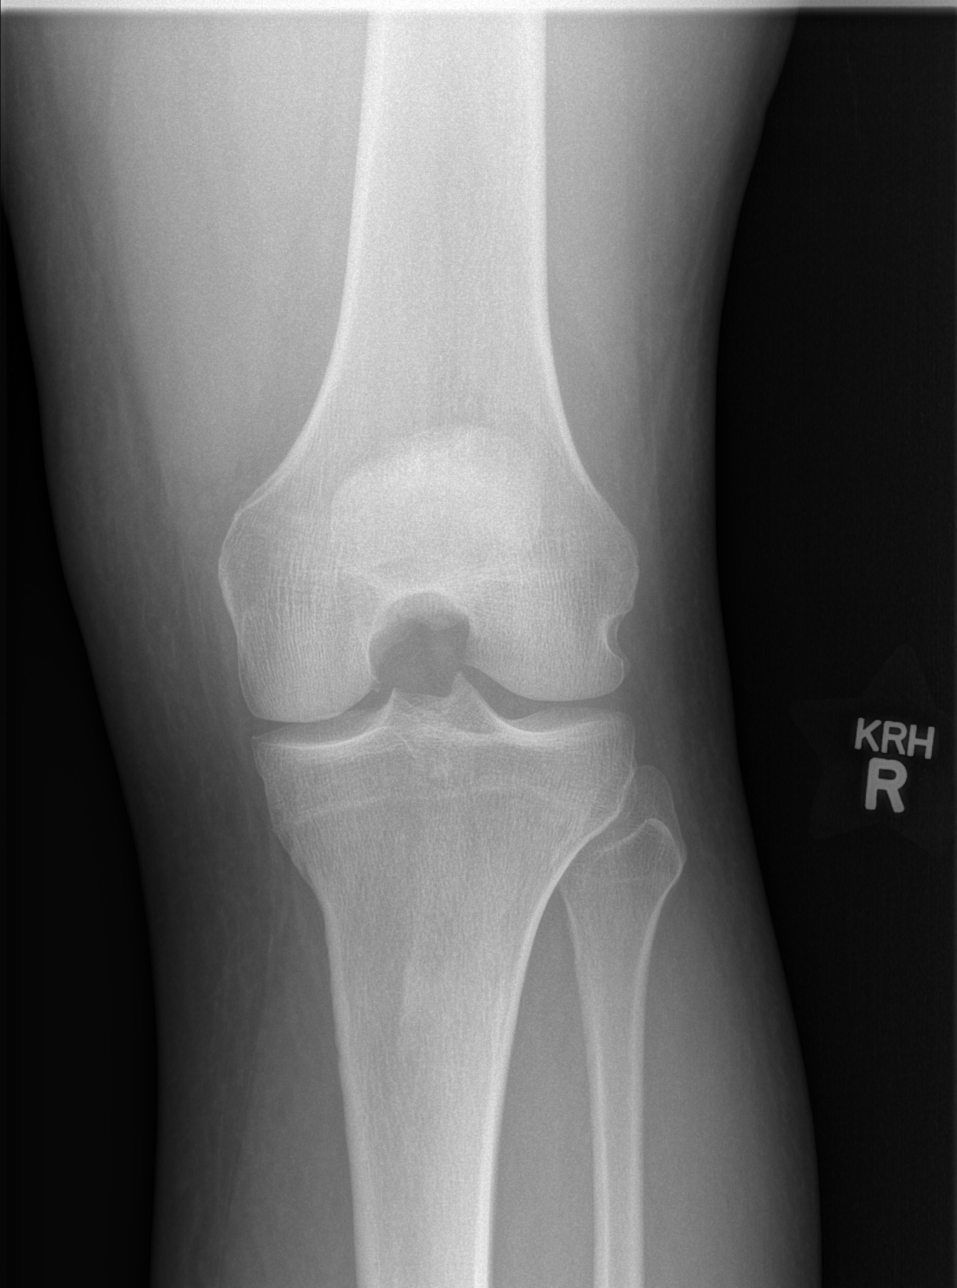

[view not recorded]
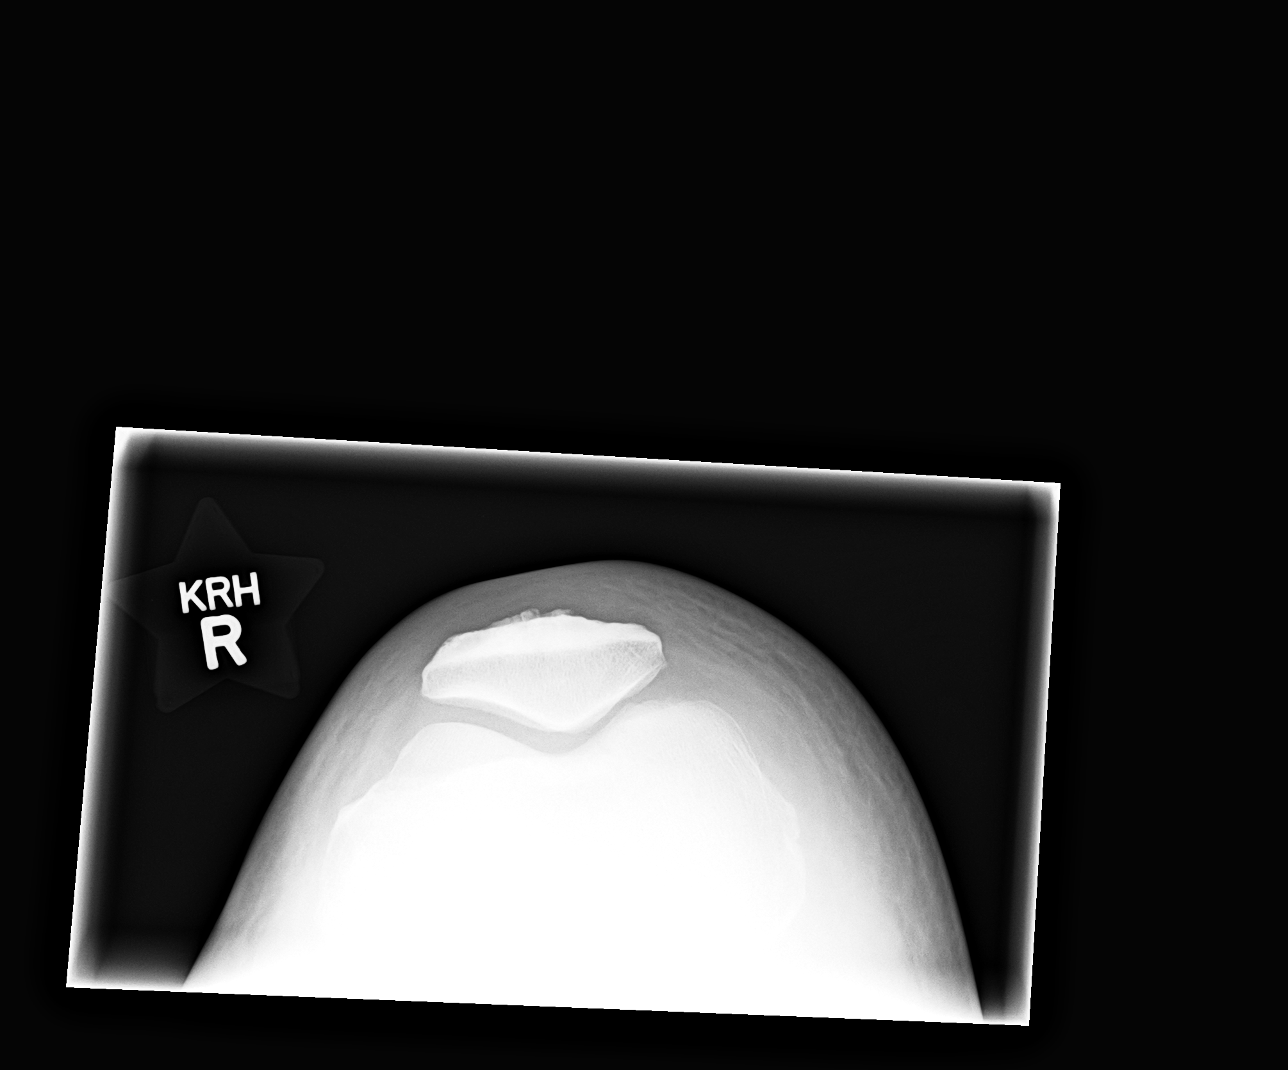

[4 of 4 positions shown; findings below may reference images not displayed]

FINDINGS: The bones of the knee are adequately mineralized. There is mild
narrowing of the lateral patellar femoral space. There are tiny
osteophytes arising from the superior and inferior articular margins
of the patella. There is calcification the region of the insertion
of the quadriceps tendon on to the superior aspect of the patella.
There is beaking of the tibial spines. The medial and lateral joint
compartments are preserved. There are small osteophytes noted from
the medial aspects of the femoral condyles.

There is no joint effusion. There is prepatellar soft tissue
swelling.
IMPRESSION: There are mild degenerative changes of the right knee greatest in
the patellofemoral compartment. There is no acute bony abnormality.

## 2014-06-14 MED ORDER — PREDNISONE 20 MG PO TABS: 40.0000 mg | ORAL_TABLET | Freq: Every day | ORAL | Status: DC

## 2014-06-14 MED ORDER — FUROSEMIDE 20 MG PO TABS: 20.0000 mg | ORAL_TABLET | Freq: Every day | ORAL | Status: DC

## 2014-06-14 NOTE — Patient Instructions (Signed)
Please go downstairs for your x-ray. I will call you with your results. Please take the steroid daily as directed to help with swelling and inflammation. You may use Tylenol for breakthrough pain. Continue to ice the knee. Elevate the knee while resting at home. Take your Lasix daily as directed. Follow-up on Monday.

## 2014-06-14 NOTE — Progress Notes (Signed)
Pre visit review using our clinic review tool, if applicable. No additional management support is needed unless otherwise documented below in the visit note/SLS  

## 2014-06-14 NOTE — Assessment & Plan Note (Signed)
Unclear etiology but suspect pseudogout. Will obtain x-ray to further assess the knee joint. Rx prednisone 40 mg 3 days. Tylenol for breakthrough pain. Supportive measures discussed with patient. Will also check CBC and uric acid level today. Follow-up next week symptoms have not completely resolved.

## 2014-06-14 NOTE — Progress Notes (Signed)
Patient presents to clinic today c/o an and swelling of his right knee with warmth to the touch over the past 5 days. Patient states that his symptoms acutely worsened about 3 days ago but has been improving over the past 24 hours. Patient denies fever or chills. Denies trauma or injury. 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 Is able to ambulate without difficulty.  Past Medical History  Diagnosis Date  . Cardiomyopathy     with a negative cardiac catheterization in the past. (EF appriximately 40-45%)   . HTN (hypertension)     x 30 years  . Sleep apnea     CPAP  . 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 Obesity, unspecified   . Asthma     Hx of childhood asthma, disappeared for a while, then resurfaced 6-7 years ago.     Current Outpatient Prescriptions on File Prior to Visit  Medication Sig Dispense Refill  . albuterol (PROAIR HFA) 108 (90 BASE) MCG/ACT inhaler Inhale 2 puffs into the lungs every 6 (six) hours as needed for wheezing or shortness of breath. 18 g 0  . albuterol (PROVENTIL) (2.5 MG/3ML) 0.083% nebulizer solution Take 3 mLs (2.5 mg total) by nebulization every 6 (six) hours as needed for wheezing. 75 mL 5  . amLODipine (NORVASC) 10 MG tablet Take 1 tablet (10 mg total) by mouth daily. 30 tablet 3  . aspirin 81 MG tablet Take 81 mg by mouth daily.      . bisoprolol (ZEBETA) 10 MG tablet Take 1 tablet (10 mg total) by mouth daily. 30 tablet 0  . Fluticasone-Salmeterol (ADVAIR DISKUS) 500-50 MCG/DOSE AEPB Inhale 1 puff into the lungs 2 (two) times daily. 60 each 3  . losartan (COZAAR) 50 MG tablet Take 1 tablet (50 mg total) by mouth 2 (two) times daily. 60 tablet 0  . montelukast (SINGULAIR) 10 MG tablet TAKE ONE TABLET BY MOUTH ONCE DAILY AT BEDTIME 30 tablet 0  . sertraline (ZOLOFT) 50 MG tablet Take 1 tablet (50 mg total) by mouth daily. 90 tablet 1  . sildenafil (VIAGRA) 100 MG tablet Take 1 tablet (100 mg total) by mouth daily as needed for  erectile dysfunction. 6 tablet 0  . spironolactone (ALDACTONE) 25 MG tablet Take 1 tablet (25 mg total) by mouth daily. 30 tablet 0   No current facility-administered medications on file prior to visit.    Allergies  Allergen Reactions  . Ace Inhibitors     REACTION: cough  . Isosorb Dinitrate-Hydralazine     REACTION: dizziness/hypotensive    Family History  Problem Relation Age of Onset  . Cancer Father     multiple melanoma  . Lupus Sister   . Asthma Daughter     History   Social History  . Marital Status: Married    Spouse Name: [name]  . Number of Children: 3  . Years of Education: N/A   Occupational History  . retired    Social History Main Topics  . Smoking status: Former Games developer  . Smokeless tobacco: Never Used     Comment: quit smoling in 1990. started when 18, 1 ppd  . Alcohol Use: Yes     Comment: occasionaly   . Drug Use: Not on file  . Sexual Activity: Not on file   Other Topics Concern  . None   Social History Narrative   Grew up in [place], attended [employer]. First wife died of breast ca in 07/29/1997. 3 children. Remarried-  8 years. 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    Pt signed designated party release granting access to [facility] to his wife [name]. Detailed message may be left on home or cell phone. Roselle Locus August 13, 2009 11:34 am.    Review of Systems - See HPI.  All other ROS are negative.  BP 126/73 mmHg  Pulse 58  Temp(Src) 98.5 F (36.9 C) (Oral)  Resp 16  Ht 6' (1.829 m)  Wt 261 lb 4 oz (118.502 kg)  BMI 35.42 kg/m2  SpO2 98%  Physical Exam  Constitutional: He is oriented to person, place, and time and well-developed, well-nourished, and in no distress.  HENT:  Head: Normocephalic and atraumatic.  Cardiovascular: Normal rate, regular rhythm, normal heart sounds and intact distal pulses.   Pulmonary/Chest: Effort normal and breath sounds normal. No respiratory distress. He has no wheezes. He has no  rales. He exhibits no tenderness.  Musculoskeletal:       Right knee: He exhibits swelling. He exhibits normal range of motion and no erythema. Tenderness found.  Neurological: He is alert and oriented to person, place, and time.  Skin: Skin is warm. No rash noted.  Psychiatric: Affect normal.  Vitals reviewed.   Recent Results (from the past 2160 hour(s))  Basic metabolic panel     Status: Abnormal   Collection Time: 05/29/14 11:38 AM  Result Value Ref Range   Sodium 135 135 - 145 mEq/L   Potassium 4.2 3.5 - 5.1 mEq/L   Chloride 106 96 - 112 mEq/L   CO2 25 19 - 32 mEq/L   Glucose, Bld 100 (H) 70 - 99 mg/dL   BUN 25 (H) 6 - 23 mg/dL   Creatinine, Ser 8.29 (H) 0.40 - 1.50 mg/dL   Calcium 9.2 8.4 - 56.2 mg/dL   GFR 13.08 (L) >65.78 mL/min  Lipid panel     Status: Abnormal   Collection Time: 05/29/14 11:38 AM  Result Value Ref Range   Cholesterol 157 0 - 200 mg/dL    Comment: ATP III Classification       Desirable:  < 200 mg/dL               Borderline High:  200 - 239 mg/dL          High:  > = 469 mg/dL   Triglycerides 62.9 0.0 - 149.0 mg/dL    Comment: Normal:  <528 mg/dLBorderline High:  150 - 199 mg/dL   HDL 41.32 >44.01 mg/dL   VLDL 02.7 0.0 - 25.3 mg/dL   LDL Cholesterol 664 (H) 0 - 99 mg/dL   Total CHOL/HDL Ratio 4     Comment:                Men          Women1/2 Average Risk     3.4          3.3Average Risk          5.0          4.42X Average Risk          9.6          7.13X Average Risk          15.0          11.0                       NonHDL 114.10     Comment: NOTE:  Non-HDL goal should be 30 mg/dL higher than patient's LDL goal (i.e. LDL goal of < 70 mg/dL, would have non-HDL goal of < 100 mg/dL)    Assessment/Plan: Pain and swelling of knee Unclear etiology but suspect pseudogout. Will obtain x-ray to further assess the knee joint. Rx prednisone 40 mg 3 days. Tylenol for breakthrough pain. Supportive measures discussed with patient. Will also check CBC and uric  acid level today. Follow-up next week symptoms have not completely resolved.

## 2014-06-15 LAB — URIC ACID: Uric Acid, Serum: 7.8 mg/dL (ref 4.0–7.8)

## 2014-06-17 ENCOUNTER — Telehealth: Payer: Self-pay | Admitting: Family

## 2014-06-17 ENCOUNTER — Encounter: Payer: Self-pay | Admitting: *Deleted

## 2014-06-17 NOTE — Telephone Encounter (Signed)
Relation to pt: self  Call back number: [phone]  Pharmacy:  Reason for call:  Pt inquiring about x ray results

## 2014-06-17 NOTE — Telephone Encounter (Signed)
SEE Result note. 

## 2014-06-26 ENCOUNTER — Telehealth: Payer: Self-pay | Admitting: *Deleted

## 2014-06-26 DIAGNOSIS — I10 Essential (primary) hypertension: Secondary | ICD-10-CM

## 2014-06-26 MED ORDER — BISOPROLOL FUMARATE 10 MG PO TABS: 10.0000 mg | ORAL_TABLET | Freq: Every day | ORAL | Status: DC

## 2014-06-26 NOTE — Telephone Encounter (Signed)
Received zebeta refill request from [facility] [address]. Refill sent.

## 2014-07-05 ENCOUNTER — Telehealth: Payer: Self-pay | Admitting: Family

## 2014-07-05 NOTE — Telephone Encounter (Signed)
Spoke with pt and advised pt to see if pharmacy would loan or let him purchase 3-4 pills (self-pay) until Provider can review and send alternative as we do not want pt to be without medication completely. Pt voices understanding. Please advise.

## 2014-07-05 NOTE — Telephone Encounter (Signed)
Relation to pt: self  Call back number: [phone] Pharmacy: -[facility] - [place], [place] - [address] [phone] (Phone) [phone] (Fax)         Reason for call:  Pt requesting an alternate RX due to insurance requesting a prio-auth for bisoprolol (ZEBETA) 10 MG tablet. Pt states he is completely out.

## 2014-07-07 MED ORDER — METOPROLOL SUCCINATE ER 25 MG PO TB24: 25.0000 mg | ORAL_TABLET | Freq: Every day | ORAL | Status: DC

## 2014-07-07 NOTE — Telephone Encounter (Signed)
  D/c bisoprolol, trial of toprol xl

## 2014-07-08 NOTE — Telephone Encounter (Signed)
Notified pt and he voices understanding. 

## 2014-08-05 ENCOUNTER — Other Ambulatory Visit: Payer: Self-pay | Admitting: Family

## 2014-08-05 NOTE — Telephone Encounter (Signed)
30 day supply of spironolactone sent to pt's pharmacy. He is due for 3 month follow up on or after 08/29/14.  Please call pt to arrange f/u.  Thanks!

## 2014-08-05 NOTE — Telephone Encounter (Signed)
Left detailed message informing patient of med refill and to schedule appointment on or after 08/29/14

## 2014-08-26 ENCOUNTER — Other Ambulatory Visit: Payer: Self-pay | Admitting: Family

## 2014-08-26 NOTE — Telephone Encounter (Signed)
Refills sent of sertraline and singulair. Letter mailed to pt.

## 2014-09-25 IMAGING — CR DG SHOULDER 2+V*L*
3 series · 3 of 3 positions shown · non-contrast
Comparison: None.

CLINICAL DATA: Left shoulder pain for 4 weeks, no known injury

EXAM:
LEFT SHOULDER - 2+ VIEW

[w shoulder grashey left]
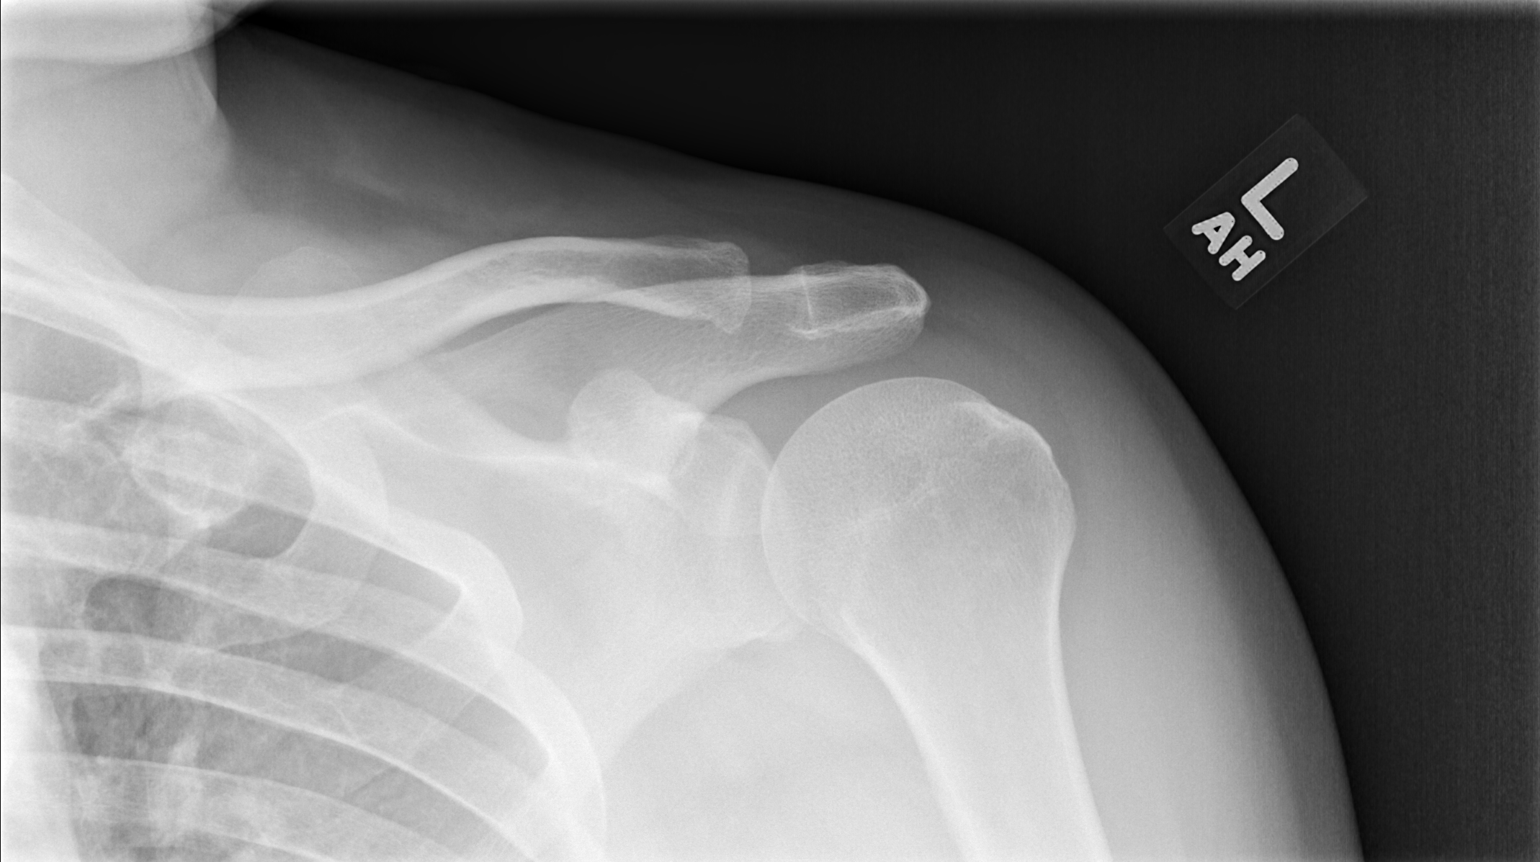

[w shoulder y view left]
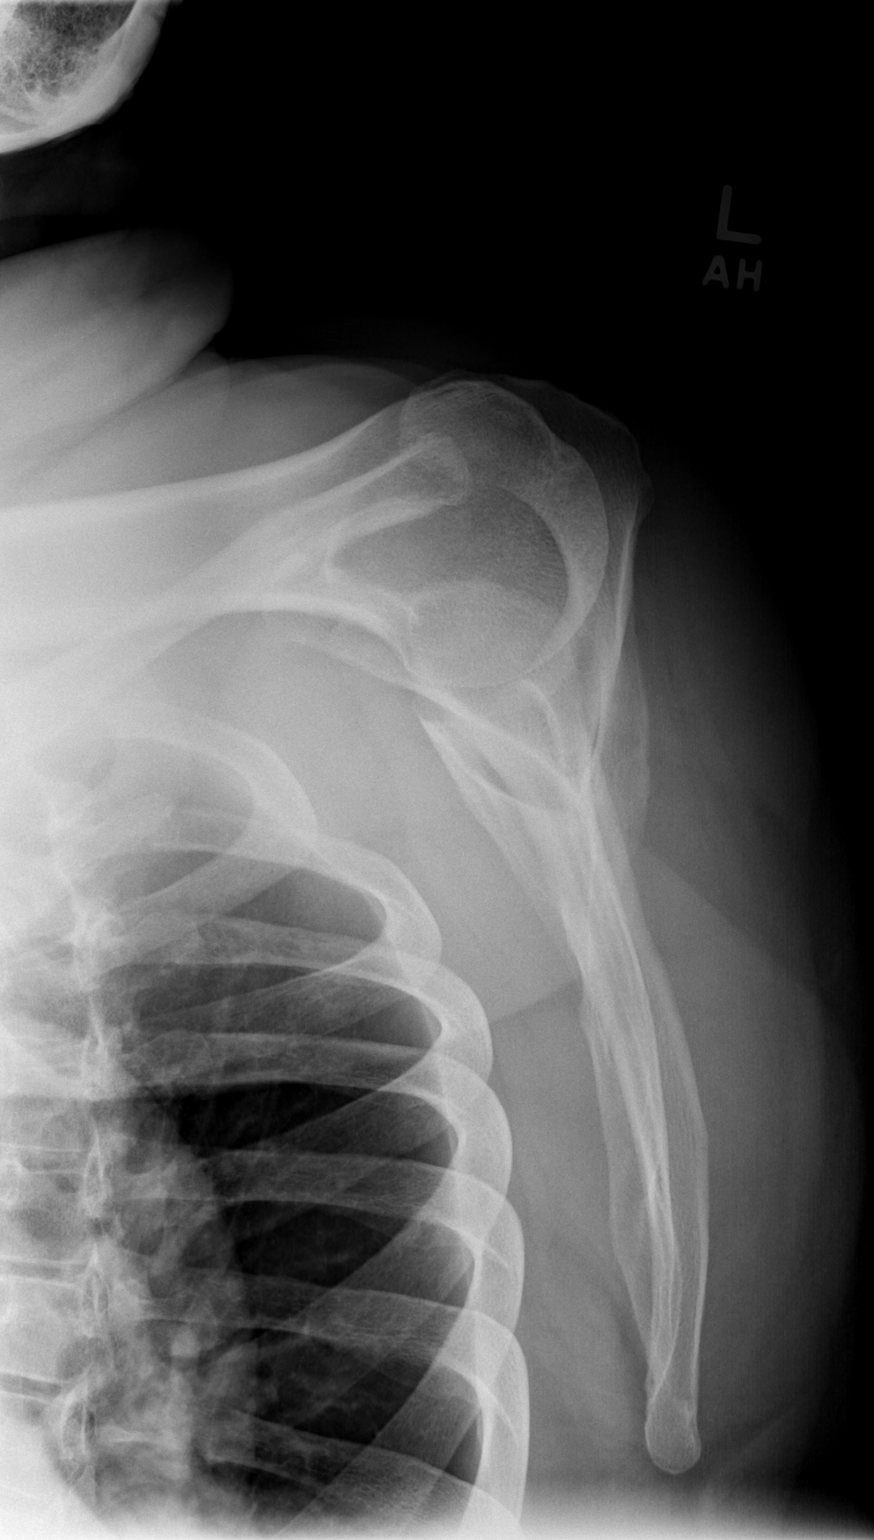

[x shoulder axillary left]
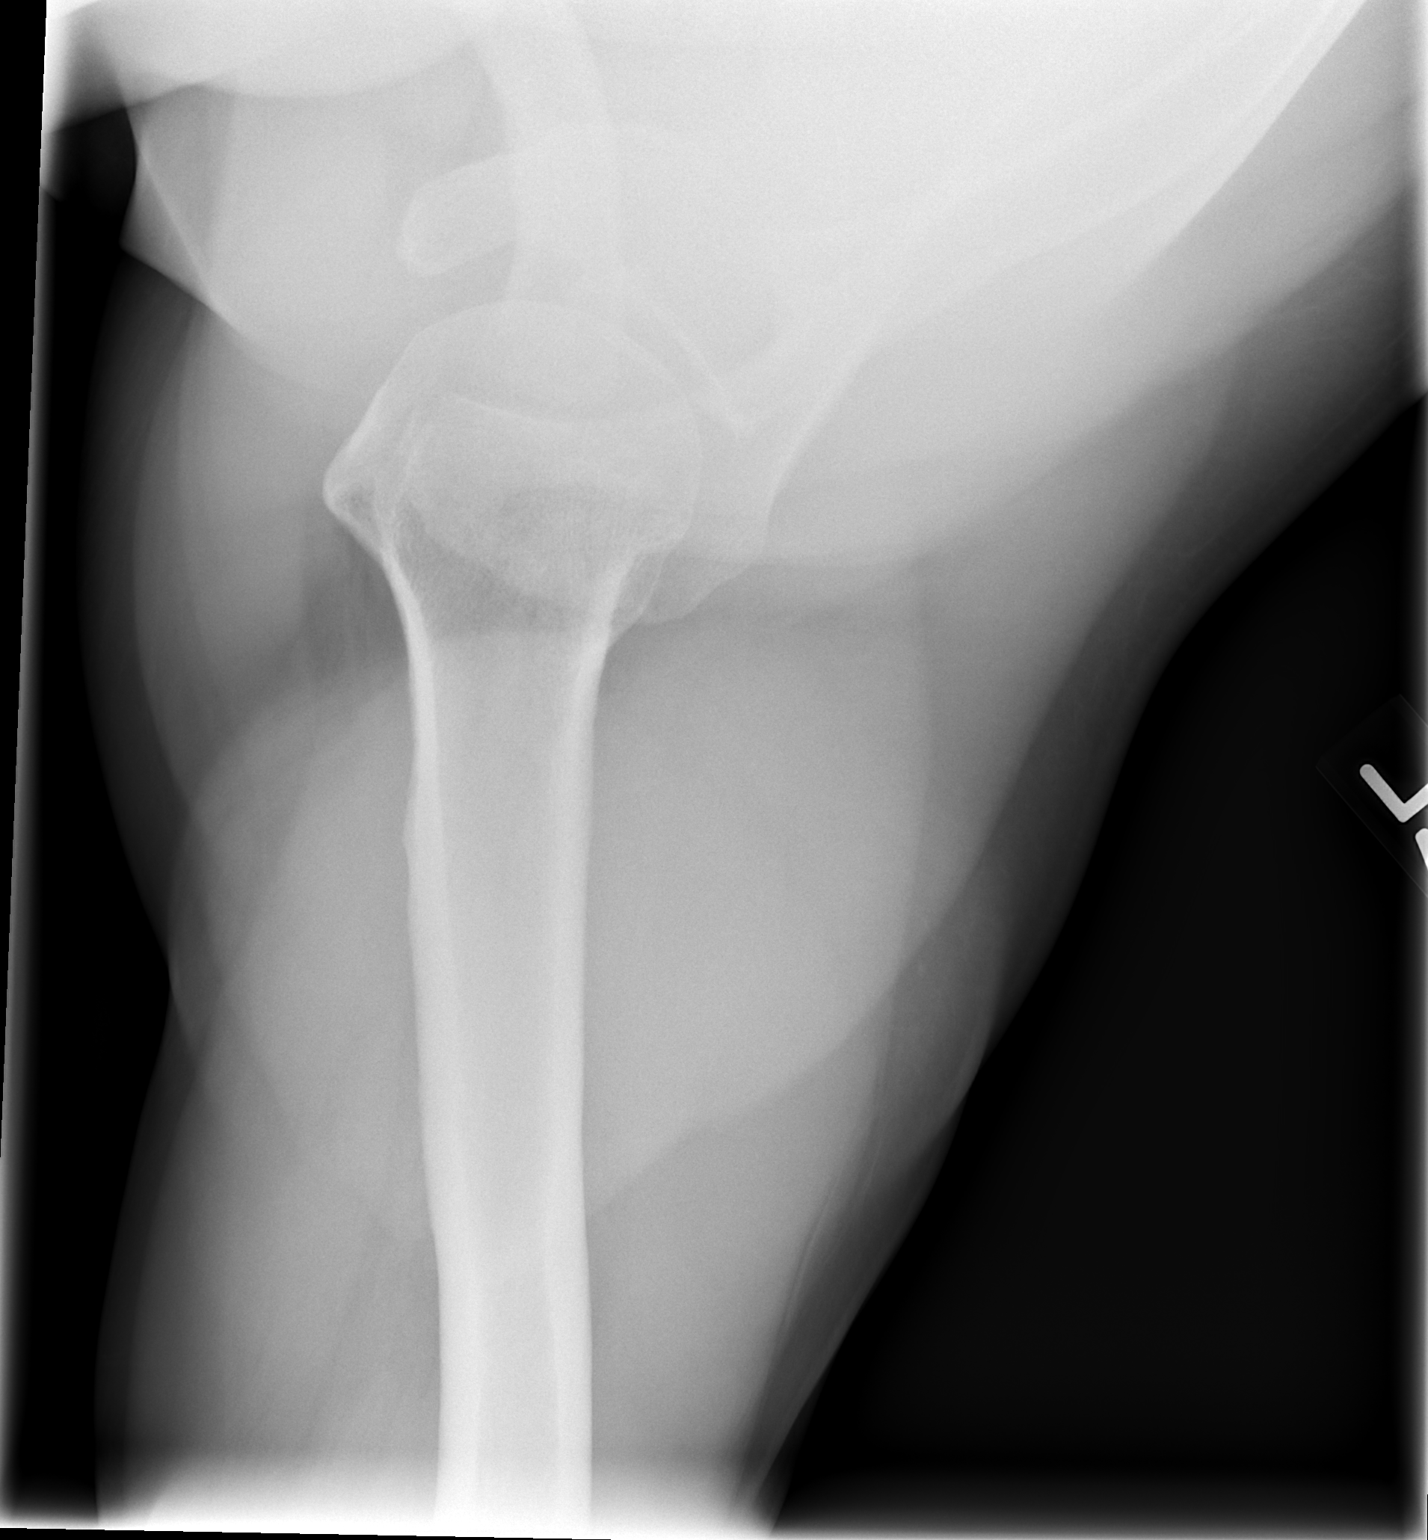

[3 of 3 positions shown; findings below may reference images not displayed]

FINDINGS: Three views of the left shoulder submitted. No acute fracture or
subluxation. Mild spurring of distal clavicle. Glenohumeral joint is
preserved.
IMPRESSION: No acute fracture or subluxation. Mild spurring of distal clavicle.
Glenohumeral joint is preserved.

## 2014-09-30 ENCOUNTER — Telehealth: Payer: Self-pay | Admitting: Family

## 2014-09-30 DIAGNOSIS — I10 Essential (primary) hypertension: Secondary | ICD-10-CM

## 2014-09-30 MED ORDER — LOSARTAN POTASSIUM 50 MG PO TABS: 50.0000 mg | ORAL_TABLET | Freq: Two times a day (BID) | ORAL | Status: DC

## 2014-09-30 MED ORDER — AMLODIPINE BESYLATE 10 MG PO TABS: 10.0000 mg | ORAL_TABLET | Freq: Every day | ORAL | Status: DC

## 2014-09-30 MED ORDER — SPIRONOLACTONE 25 MG PO TABS: 25.0000 mg | ORAL_TABLET | Freq: Every day | ORAL | Status: DC

## 2014-09-30 NOTE — Telephone Encounter (Signed)
30 day supply of each sent to pharmacy.

## 2014-09-30 NOTE — Telephone Encounter (Signed)
Notified pt. 

## 2014-09-30 NOTE — Telephone Encounter (Signed)
Caller name: Hawkin Relation to pt: Call back number:  Pharmacy: [facility] [address]  Reason for call:   Patient scheduled appt for next week and is needing refills of losartan, amlodipine, and spironolactone

## 2014-10-09 ENCOUNTER — Ambulatory Visit (INDEPENDENT_AMBULATORY_CARE_PROVIDER_SITE_OTHER): Payer: Medicare Other | Admitting: Family

## 2014-10-09 ENCOUNTER — Encounter: Payer: Self-pay | Admitting: Family

## 2014-10-09 VITALS — BP 130/78 | HR 72 | Temp 98.7°F | Resp 18 | Ht 72.0 in | Wt 257.0 lb

## 2014-10-09 DIAGNOSIS — J4540 Moderate persistent asthma, uncomplicated: Secondary | ICD-10-CM

## 2014-10-09 DIAGNOSIS — R739 Hyperglycemia, unspecified: Secondary | ICD-10-CM

## 2014-10-09 DIAGNOSIS — G4733 Obstructive sleep apnea (adult) (pediatric): Secondary | ICD-10-CM | POA: Diagnosis not present

## 2014-10-09 DIAGNOSIS — I499 Cardiac arrhythmia, unspecified: Secondary | ICD-10-CM

## 2014-10-09 DIAGNOSIS — N259 Disorder resulting from impaired renal tubular function, unspecified: Secondary | ICD-10-CM

## 2014-10-09 DIAGNOSIS — I10 Essential (primary) hypertension: Secondary | ICD-10-CM | POA: Diagnosis not present

## 2014-10-09 DIAGNOSIS — I5022 Chronic systolic (congestive) heart failure: Secondary | ICD-10-CM

## 2014-10-09 LAB — BASIC METABOLIC PANEL
BUN: 18 mg/dL (ref 6–23)
CALCIUM: 9.5 mg/dL (ref 8.4–10.5)
CO2: 27 meq/L (ref 19–32)
Chloride: 106 mEq/L (ref 96–112)
Creatinine, Ser: 1.44 mg/dL (ref 0.40–1.50)
GFR: 61.71 mL/min (ref >60.00)
Glucose, Bld: 96 mg/dL (ref 70–99)
Potassium: 4 mEq/L (ref 3.5–5.1)
Sodium: 139 mEq/L (ref 135–145)

## 2014-10-09 LAB — HEMOGLOBIN A1C: Hgb A1c MFr Bld: 5.7 % (ref 4.6–6.5)

## 2014-10-09 MED ORDER — LOSARTAN POTASSIUM 50 MG PO TABS: 50.0000 mg | ORAL_TABLET | Freq: Two times a day (BID) | ORAL | Status: DC

## 2014-10-09 MED ORDER — SPIRONOLACTONE 25 MG PO TABS: 25.0000 mg | ORAL_TABLET | Freq: Every day | ORAL | Status: DC

## 2014-10-09 MED ORDER — AMLODIPINE BESYLATE 10 MG PO TABS: 10.0000 mg | ORAL_TABLET | Freq: Every day | ORAL | Status: DC

## 2014-10-09 MED ORDER — FLUTICASONE-SALMETEROL 500-50 MCG/DOSE IN AEPB: 1.0000 | INHALATION_SPRAY | Freq: Two times a day (BID) | RESPIRATORY_TRACT | Status: DC

## 2014-10-09 MED ORDER — METOPROLOL SUCCINATE ER 25 MG PO TB24: 25.0000 mg | ORAL_TABLET | Freq: Every day | ORAL | Status: DC

## 2014-10-09 NOTE — Patient Instructions (Signed)
Please complete lab work prior to leaving. Follow up in 3 months.  

## 2014-10-09 NOTE — Progress Notes (Signed)
Subjective:    Patient ID: Brandon [name], male    DOB: 01/10/1941, 74 y.o.   MRN: [id number]  HPI  [name] is a 74 yr old male who presents today for follow up.   1) HTN- Curretly maintained on toprol xl, lasix, amlodipine.  Denies CP/SOB or swelling  BP Readings from Last 3 Encounters:  10/09/14 130/78  06/14/14 126/73  05/29/14 138/80   2) Asthma- last visit advair was increased form 250 to .  Reports improvement in his asthma symptoms with this change.   3) CHF- follows with Dr. Antoine Poche. Denies SOB.  4) Renal insufficiency-  Lab Results  Component Value Date   CREATININE 1.56* 05/29/2014   5) OSA- [facility].  Reports good compliance with CPAP.     Review of Systems See HPI  Past Medical History  Diagnosis Date  . Cardiomyopathy     with a negative cardiac catheterization in the past. (EF appriximately 40-45%)   . HTN (hypertension)     x 30 years  . Sleep apnea     CPAP  . 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 Obesity, unspecified   . Asthma     Hx of childhood asthma, disappeared for a while, then resurfaced 6-7 years ago.     History   Social History  . Marital Status: Married    Spouse Name: [name]  . Number of Children: 3  . Years of Education: N/A   Occupational History  . retired    Social History Main Topics  . Smoking status: Former Games developer  . Smokeless tobacco: Never Used     Comment: quit smoling in 1990. started when 18, 1 ppd  . Alcohol Use: Yes     Comment: occasionaly   . Drug Use: Not on file  . Sexual Activity: Not on file   Other Topics Concern  . Not on file   Social History Narrative   Grew up in [place], attended [employer]. First wife died of breast ca in 07/07/97. 3 children. Remarried- 8 years. 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    Pt signed designated party release granting access to [facility] to his wife [name]. Detailed message may be  left on home or cell phone. Roselle Locus August 13, 2009 11:34 am.     Past Surgical History  Procedure Laterality Date  . None      Family History  Problem Relation Age of Onset  . Cancer Father     multiple melanoma  . Lupus Sister   . Asthma Daughter     Allergies  Allergen Reactions  . Ace Inhibitors     REACTION: cough  . Isosorb Dinitrate-Hydralazine     REACTION: dizziness/hypotensive    Current Outpatient Prescriptions on File Prior to Visit  Medication Sig Dispense Refill  . albuterol (PROAIR HFA) 108 (90 BASE) MCG/ACT inhaler Inhale 2 puffs into the lungs every 6 (six) hours as needed for wheezing or shortness of breath. 18 g 0  . albuterol (PROVENTIL) (2.5 MG/3ML) 0.083% nebulizer solution Take 3 mLs (2.5 mg total) by nebulization every 6 (six) hours as needed for wheezing. 75 mL 5  . aspirin 81 MG tablet Take 81 mg by mouth daily.      . furosemide (LASIX) 20 MG tablet Take 1 tablet (20 mg total) by mouth daily. 10 tablet 0  . montelukast (SINGULAIR) 10 MG tablet TAKE ONE TABLET BY  MOUTH ONCE DAILY AT BEDTIME 90 tablet 0  . sertraline (ZOLOFT) 50 MG tablet TAKE ONE TABLET BY MOUTH ONCE DAILY 90 tablet 0   No current facility-administered medications on file prior to visit.    BP 130/78 mmHg  Pulse 72  Temp(Src) 98.7 F (37.1 C) (Oral)  Resp 18  Ht 6' (1.829 m)  Wt 257 lb (116.574 kg)  BMI 34.85 kg/m2  SpO2 94%       Objective:   Physical Exam  Constitutional: He is oriented to person, place, and time. He appears well-developed and well-nourished. No distress.  HENT:  Head: Normocephalic and atraumatic.  Cardiovascular: Normal rate.  An irregular rhythm present.  No murmur heard. Pulmonary/Chest: Effort normal and breath sounds normal. No respiratory distress. He has no wheezes. He has no rales.  Musculoskeletal: He exhibits no edema.  Neurological: He is alert and oriented to person, place, and time.  Skin: Skin is warm and dry.  Psychiatric:  He has a normal mood and affect. His behavior is normal. Thought content normal.          Assessment & Plan:  EKG tracing is personally reviewed.  EKG notes NSR.  No acute changes- does appear slightly different from previous EKG when compared. Will forward to his cardiologist for review.

## 2014-10-09 NOTE — Assessment & Plan Note (Signed)
Improved on current dose of advair. Continue same + prn albuterol.

## 2014-10-09 NOTE — Assessment & Plan Note (Signed)
Stable, good compliance with CPAP, continue same.

## 2014-10-09 NOTE — Progress Notes (Signed)
Pre visit review using our clinic review tool, if applicable. No additional management support is needed unless otherwise documented below in the visit note. 

## 2014-10-09 NOTE — Assessment & Plan Note (Signed)
Euvolemic clinically.  Monitor.

## 2014-10-09 NOTE — Assessment & Plan Note (Signed)
Lab Results  Component Value Date   CREATININE 1.44 10/09/2014   CR today is slightly improved from previous. Monitor.

## 2014-12-10 ENCOUNTER — Other Ambulatory Visit: Payer: Self-pay | Admitting: Family

## 2014-12-10 NOTE — Telephone Encounter (Signed)
Refill sent to pharmacy for sertraline and singulair.  Pt is due for 3 month follow up on 01/09/15.  Please call pt to arrange appointment with [name] at that time.  Thanks!

## 2014-12-12 NOTE — Telephone Encounter (Signed)
Patient is currently out of town and will call to schedule.

## 2015-01-02 ENCOUNTER — Telehealth: Payer: Self-pay

## 2015-01-02 NOTE — Telephone Encounter (Signed)
Called to schedule Medicare Wellness Visit with [facility].  Left a message for call back.  

## 2015-02-26 ENCOUNTER — Ambulatory Visit (INDEPENDENT_AMBULATORY_CARE_PROVIDER_SITE_OTHER): Payer: Medicare Other | Admitting: *Deleted

## 2015-02-26 ENCOUNTER — Encounter: Payer: Self-pay | Admitting: Family

## 2015-02-26 ENCOUNTER — Ambulatory Visit (INDEPENDENT_AMBULATORY_CARE_PROVIDER_SITE_OTHER): Payer: Medicare Other | Admitting: Family

## 2015-02-26 VITALS — BP 140/82 | HR 63 | Temp 98.5°F | Resp 18 | Ht 72.0 in | Wt 262.8 lb

## 2015-02-26 DIAGNOSIS — J4540 Moderate persistent asthma, uncomplicated: Secondary | ICD-10-CM | POA: Diagnosis not present

## 2015-02-26 DIAGNOSIS — Z23 Encounter for immunization: Secondary | ICD-10-CM

## 2015-02-26 DIAGNOSIS — R739 Hyperglycemia, unspecified: Secondary | ICD-10-CM | POA: Diagnosis not present

## 2015-02-26 DIAGNOSIS — I10 Essential (primary) hypertension: Secondary | ICD-10-CM

## 2015-02-26 LAB — BASIC METABOLIC PANEL
BUN: 21 mg/dL (ref 6–23)
CHLORIDE: 107 meq/L (ref 96–112)
CO2: 24 meq/L (ref 19–32)
CREATININE: 1.51 mg/dL — AB (ref 0.40–1.50)
Calcium: 9.7 mg/dL (ref 8.4–10.5)
GFR: 58.36 mL/min — ABNORMAL LOW (ref >60.00)
GLUCOSE: 113 mg/dL — AB (ref 70–99)
POTASSIUM: 4.1 meq/L (ref 3.5–5.1)
Sodium: 139 mEq/L (ref 135–145)

## 2015-02-26 LAB — HEMOGLOBIN A1C: Hgb A1c MFr Bld: 6 % (ref 4.6–6.5)

## 2015-02-26 MED ORDER — METOPROLOL SUCCINATE ER 25 MG PO TB24: 25.0000 mg | ORAL_TABLET | Freq: Every day | ORAL | Status: DC

## 2015-02-26 MED ORDER — PREDNISONE 10 MG PO TABS: ORAL_TABLET | ORAL | Status: DC

## 2015-02-26 MED ORDER — SPIRONOLACTONE 25 MG PO TABS: 25.0000 mg | ORAL_TABLET | Freq: Every day | ORAL | Status: DC

## 2015-02-26 MED ORDER — FLUTICASONE-SALMETEROL 500-50 MCG/DOSE IN AEPB
1.0000 | INHALATION_SPRAY | Freq: Two times a day (BID) | RESPIRATORY_TRACT | Status: DC
Start: 2015-02-26 — End: 2015-11-04

## 2015-02-26 MED ORDER — LOSARTAN POTASSIUM 50 MG PO TABS: 50.0000 mg | ORAL_TABLET | Freq: Two times a day (BID) | ORAL | Status: DC

## 2015-02-26 NOTE — Patient Instructions (Signed)
Start prednisone taper. Continue albuterol every 6 hours as needed. Restart advair when you are able.

## 2015-02-26 NOTE — Assessment & Plan Note (Signed)
Clinically stable, obtain A1C, BMET.

## 2015-02-26 NOTE — Assessment & Plan Note (Signed)
Deteriorated, add pred taper, continue albuterol, singulair.

## 2015-02-26 NOTE — Assessment & Plan Note (Signed)
BP stable on current meds, continue same.  

## 2015-02-26 NOTE — Progress Notes (Signed)
Subjective:    Patient ID: Brandon [name], male    DOB: 1940/09/17, 74 y.o.   MRN: [id number]  HPI  Asthma- notes increased asthma symptoms.  Ran out of advair a few weeks ago.   HTN-  On metoprolol, losartan, amlodipine, and aldactone.   BP Readings from Last 3 Encounters:  02/26/15 140/82  10/09/14 130/78  06/14/14 126/73   Depression- stable on zoloft.    Review of Systems See HPI  Past Medical History  Diagnosis Date  . Cardiomyopathy     with a negative cardiac catheterization in the past. (EF appriximately 40-45%)   . HTN (hypertension)     x 30 years  . Sleep apnea     CPAP  . 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 Obesity, unspecified   . Asthma     Hx of childhood asthma, disappeared for a while, then resurfaced 6-7 years ago.     Social History   Social History  . Marital Status: Married    Spouse Name: [name]  . Number of Children: 3  . Years of Education: N/A   Occupational History  . retired    Social History Main Topics  . Smoking status: Former Games developer  . Smokeless tobacco: Never Used     Comment: quit smoling in 1990. started when 18, 1 ppd  . Alcohol Use: Yes     Comment: occasionaly   . Drug Use: Not on file  . Sexual Activity: Not on file   Other Topics Concern  . Not on file   Social History Narrative   Grew up in [place], attended [employer]. First wife died of breast ca in 07/20/1997. 3 children. Remarried- 8 years. 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    Pt signed designated party release granting access to [facility] to his wife [name]. Detailed message may be left on home or cell phone. Roselle Locus August 13, 2009 11:34 am.     Past Surgical History  Procedure Laterality Date  . None      Family History  Problem Relation Age of Onset  . Cancer Father     multiple melanoma  . Lupus Sister   . Asthma Daughter     Allergies  Allergen Reactions  . Ace Inhibitors    REACTION: cough  . Isosorb Dinitrate-Hydralazine     REACTION: dizziness/hypotensive    Current Outpatient Prescriptions on File Prior to Visit  Medication Sig Dispense Refill  . albuterol (PROAIR HFA) 108 (90 BASE) MCG/ACT inhaler Inhale 2 puffs into the lungs every 6 (six) hours as needed for wheezing or shortness of breath. 18 g 0  . albuterol (PROVENTIL) (2.5 MG/3ML) 0.083% nebulizer solution Take 3 mLs (2.5 mg total) by nebulization every 6 (six) hours as needed for wheezing. 75 mL 5  . amLODipine (NORVASC) 10 MG tablet Take 1 tablet (10 mg total) by mouth daily. 90 tablet 1  . aspirin 81 MG tablet Take 81 mg by mouth daily.      . montelukast (SINGULAIR) 10 MG tablet TAKE ONE TABLET BY MOUTH ONCE DAILY AT BEDTIME 90 tablet 1  . sertraline (ZOLOFT) 50 MG tablet TAKE ONE TABLET BY MOUTH ONCE DAILY 90 tablet 1   No current facility-administered medications on file prior to visit.    BP 140/82 mmHg  Pulse 63  Temp(Src) 98.5 F (36.9 C) (Oral)  Resp 18  Ht 6' (1.829 m)  Wt 262 lb 12.8 oz (  119.205 kg)  BMI 35.63 kg/m2  SpO2 97%       Objective:   Physical Exam  Constitutional: He is oriented to person, place, and time. He appears well-developed and well-nourished. No distress.  HENT:  Head: Normocephalic and atraumatic.  Cardiovascular: Normal rate and regular rhythm.   No murmur heard. Pulmonary/Chest: Effort normal. No respiratory distress. He has wheezes. He has no rales.  Musculoskeletal: He exhibits no edema.  Neurological: He is alert and oriented to person, place, and time.  Skin: Skin is warm and dry.  Psychiatric: He has a normal mood and affect. His behavior is normal. Thought content normal.          Assessment & Plan:  Flu shot today.

## 2015-02-27 ENCOUNTER — Encounter: Payer: Self-pay | Admitting: Family

## 2015-03-05 ENCOUNTER — Other Ambulatory Visit: Payer: Self-pay | Admitting: Family

## 2015-04-08 ENCOUNTER — Encounter: Payer: Self-pay | Admitting: Cardiology

## 2015-04-08 ENCOUNTER — Ambulatory Visit (INDEPENDENT_AMBULATORY_CARE_PROVIDER_SITE_OTHER): Payer: Medicare Other | Admitting: Cardiology

## 2015-04-08 VITALS — BP 126/80 | HR 66 | Ht 72.0 in | Wt 265.3 lb

## 2015-04-08 DIAGNOSIS — I420 Dilated cardiomyopathy: Secondary | ICD-10-CM | POA: Diagnosis not present

## 2015-04-08 NOTE — Progress Notes (Signed)
HPI The patient presents for followup of known cardiomyopathy. Since I last saw him he has done OK.  The patient denies any new symptoms such as chest discomfort, neck or arm discomfort. There has been no new shortness of breath, PND or orthopnea. There have been no reported palpitations, presyncope or syncope.  He is not exercising again this year as I would like.   With his level of activity he is not having any new symptoms.    Allergies  Allergen Reactions  . Ace Inhibitors     REACTION: cough  . Isosorb Dinitrate-Hydralazine     REACTION: dizziness/hypotensive    Current Outpatient Prescriptions  Medication Sig Dispense Refill  . albuterol (PROVENTIL) (2.5 MG/3ML) 0.083% nebulizer solution Take 3 mLs (2.5 mg total) by nebulization every 6 (six) hours as needed for wheezing. 75 mL 5  . amLODipine (NORVASC) 10 MG tablet Take 1 tablet (10 mg total) by mouth daily. 90 tablet 1  . aspirin 81 MG tablet Take 81 mg by mouth daily.      . Fluticasone-Salmeterol (ADVAIR DISKUS) 500-50 MCG/DOSE AEPB Inhale 1 puff into the lungs 2 (two) times daily. 180 each 1  . losartan (COZAAR) 50 MG tablet Take 1 tablet (50 mg total) by mouth 2 (two) times daily. 180 tablet 1  . metoprolol succinate (TOPROL-XL) 25 MG 24 hr tablet Take 1 tablet (25 mg total) by mouth daily. 90 tablet 1  . montelukast (SINGULAIR) 10 MG tablet TAKE ONE TABLET BY MOUTH ONCE DAILY AT BEDTIME 90 tablet 1  . predniSONE (DELTASONE) 10 MG tablet 4 tabs by mouth once daily x 2 days, then 3 tabs daily x 2 days, then 2 tabs daily x 2 days, then 1 tab daily x 2 days 20 tablet 0  . PROAIR HFA 108 (90 BASE) MCG/ACT inhaler INHALE TWO PUFFS BY MOUTH EVERY 6 HOURS AS NEEDED FOR WHEEZING OR SHORTNESS OF BREATH 18 each 0  . sertraline (ZOLOFT) 50 MG tablet TAKE ONE TABLET BY MOUTH ONCE DAILY 90 tablet 1  . spironolactone (ALDACTONE) 25 MG tablet Take 1 tablet (25 mg total) by mouth daily. 90 tablet 1   No current facility-administered  medications for this visit.    Past Medical History  Diagnosis Date  . Cardiomyopathy     with a negative cardiac catheterization in the past. (EF appriximately 40-45%)   . HTN (hypertension)     x 30 years  . Sleep apnea     CPAP  . 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 Obesity, unspecified   . Asthma     Hx of childhood asthma, disappeared for a while, then resurfaced 6-7 years ago.     Past Surgical History  Procedure Laterality Date  . None      ROS:  As stated in the HPI and negative for all other systems.  PHYSICAL EXAM BP 126/80 mmHg  Pulse 66  Ht 6' (1.829 m)  Wt 265 lb 4.8 oz (120.339 kg)  BMI 35.97 kg/m2 GENERAL:  Well appearing NECK:  No jugular venous distention, waveform within normal limits, carotid upstroke brisk and symmetric, no bruits, no thyromegaly LUNGS:  Clear to auscultation bilaterally BACK:  No CVA tenderness CHEST:  Unremarkable HEART:  PMI not displaced or sustained,S1 and S2 within normal limits, no S3, no S4, no clicks, no rubs, no murmurs ABD:  Flat, positive bowel sounds normal in frequency in pitch, no bruits, no rebound, no guarding, no midline pulsatile  mass, no hepatomegaly, no splenomegaly, obese EXT:  2 plus pulses throughout, trace edema, no cyanosis no clubbing    ASSESSMENT AND PLAN  CARDIOMYOPATHY -  He seems to be euvolemic. He will continue the meds as listed.  His echo last year demonstrated a mildly reduced stable EF of 45 - 50%.  HYPERTENSION -  The blood pressure is at target. No change in medications is indicated. We will continue with therapeutic lifestyle changes (TLC).  OBSTRUCTIVE SLEEP APNEA -  He is wearing the CPAP every night  OBESITY, UNSPECIFIED -  We had a another long discussion about weight loss and I again gave him specifics on diet and exercise.    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 No change in therapy is planned.

## 2015-04-08 NOTE — Patient Instructions (Signed)
Dr Hochrein recommends that you schedule a follow-up appointment in 1 year. You will receive a reminder letter in the mail two months in advance. If you don't receive a letter, please call our office to schedule the follow-up appointment.  If you need a refill on your cardiac medications before your next appointment, please call your pharmacy. 

## 2015-07-30 ENCOUNTER — Other Ambulatory Visit: Payer: Self-pay | Admitting: Family

## 2015-08-22 ENCOUNTER — Other Ambulatory Visit: Payer: Self-pay | Admitting: Family

## 2015-08-22 NOTE — Telephone Encounter (Signed)
Singulair refill sent to pharmacy. Pt last seen 02/2015 and is due for 6 month follow up.  Please call pt to schedule follow up with [name] soon. Thanks!

## 2015-08-26 NOTE — Telephone Encounter (Signed)
Pt has been scheduled.  °

## 2015-09-15 ENCOUNTER — Other Ambulatory Visit: Payer: Self-pay | Admitting: Family

## 2015-09-15 NOTE — Telephone Encounter (Signed)
Pt is due for follow up please.  

## 2015-09-15 NOTE — Telephone Encounter (Signed)
90 day supply amlodipine refilled to pharmacy. Pt last seen by PCP 02/2015. Please advise when pt should follow up in the office?

## 2015-09-17 ENCOUNTER — Telehealth: Payer: Self-pay | Admitting: Family

## 2015-09-17 NOTE — Telephone Encounter (Signed)
Spoke w/ Pt, informed him okay per [name] to start medication. Pt verbalized understanding.

## 2015-09-17 NOTE — Telephone Encounter (Signed)
Please advise 

## 2015-09-17 NOTE — Telephone Encounter (Signed)
OK with current medications.

## 2015-09-17 NOTE — Telephone Encounter (Signed)
Caller name: Relationship to patient: Self Can be reached: [phone] Pharmacy:  Reason for call: Patient was Rx'd Clyndamycin and Hydrocodone today from his Dentist and would like to know if they interact with any of his current medications.

## 2015-09-21 ENCOUNTER — Other Ambulatory Visit: Payer: Self-pay | Admitting: Family

## 2015-09-22 NOTE — Telephone Encounter (Signed)
Pt called in to follow up on his refill request. Pt says that he is out and desperately need it.    Pt would like to pick up from pharmacy today if possible

## 2015-10-29 ENCOUNTER — Other Ambulatory Visit: Payer: Self-pay | Admitting: Family

## 2015-10-31 ENCOUNTER — Telehealth: Payer: Self-pay | Admitting: Family

## 2015-10-31 MED ORDER — ALBUTEROL SULFATE HFA 108 (90 BASE) MCG/ACT IN AERS: 2.0000 | INHALATION_SPRAY | Freq: Four times a day (QID) | RESPIRATORY_TRACT | 5 refills | Status: DC | PRN

## 2015-10-31 NOTE — Telephone Encounter (Signed)
°  Relationship to patient: Self  Can be reached: [phone]    Reason for call:Patient request call back in ref to medications.

## 2015-10-31 NOTE — Telephone Encounter (Signed)
Sent Rx for ventolin inhaler. Notified pt. Placed PA form for proair in blue folder on CMA desk in case Ventolin does not go through as well.

## 2015-10-31 NOTE — Telephone Encounter (Signed)
°  Relation to ZO:[employer] & [employer]  Call back number: [phone]  Pharmacy: [facility] [address] [place], [place] - [address] [phone] (Phone) [phone] (Fax)      Reason for call:  [employer] called on 3 way with patient regarding the medication below. [employer] re faxed PA form to [phone] Attention Nicki [place]. Alternate inhaler ventolin hfa 108 mcg. Patient stated he is completely out and would like a call before the end of the business day.

## 2015-10-31 NOTE — Telephone Encounter (Signed)
Spoke with pt. He states insurance will no longer be covering Proair inhaler. Insurance is supposed to be faxing expedited paperwork (PA) to [place] or pt requests another rescue inhaler that is covered. Advised pt to check his drug formulary for covered inhalers. States he will look at that today and call me back with possible alternatives. States he needs something today.

## 2015-10-31 NOTE — Telephone Encounter (Signed)
Spoke with pt, Formulary only covers advair HFA and albuterol nebulizer. Finally received fax from [facility] for PA. Form forwarded to PCP for signature.

## 2015-11-04 ENCOUNTER — Encounter: Payer: Self-pay | Admitting: Internal Medicine

## 2015-11-04 ENCOUNTER — Ambulatory Visit (INDEPENDENT_AMBULATORY_CARE_PROVIDER_SITE_OTHER): Payer: Medicare Other | Admitting: Internal Medicine

## 2015-11-04 ENCOUNTER — Ambulatory Visit (HOSPITAL_BASED_OUTPATIENT_CLINIC_OR_DEPARTMENT_OTHER)
Admission: RE | Admit: 2015-11-04 | Discharge: 2015-11-04 | Disposition: A | Discharge status: Home / Self Care | Payer: Medicare Other | Source: Ambulatory Visit | Attending: Internal Medicine | Admitting: Internal Medicine

## 2015-11-04 ENCOUNTER — Telehealth: Payer: Self-pay | Admitting: Family

## 2015-11-04 VITALS — BP 124/72 | HR 63 | Temp 98.4°F | Resp 14 | Ht 72.0 in | Wt 252.0 lb

## 2015-11-04 DIAGNOSIS — J4540 Moderate persistent asthma, uncomplicated: Secondary | ICD-10-CM

## 2015-11-04 DIAGNOSIS — J9811 Atelectasis: Secondary | ICD-10-CM | POA: Diagnosis not present

## 2015-11-04 IMAGING — DX DG CHEST 2V
2 series · 2 of 2 positions shown · non-contrast
Comparison: [DATE] .

CLINICAL DATA: Congestion.  Cough.  Shortness breath.

EXAM:
CHEST  2 VIEW

[chest pa]
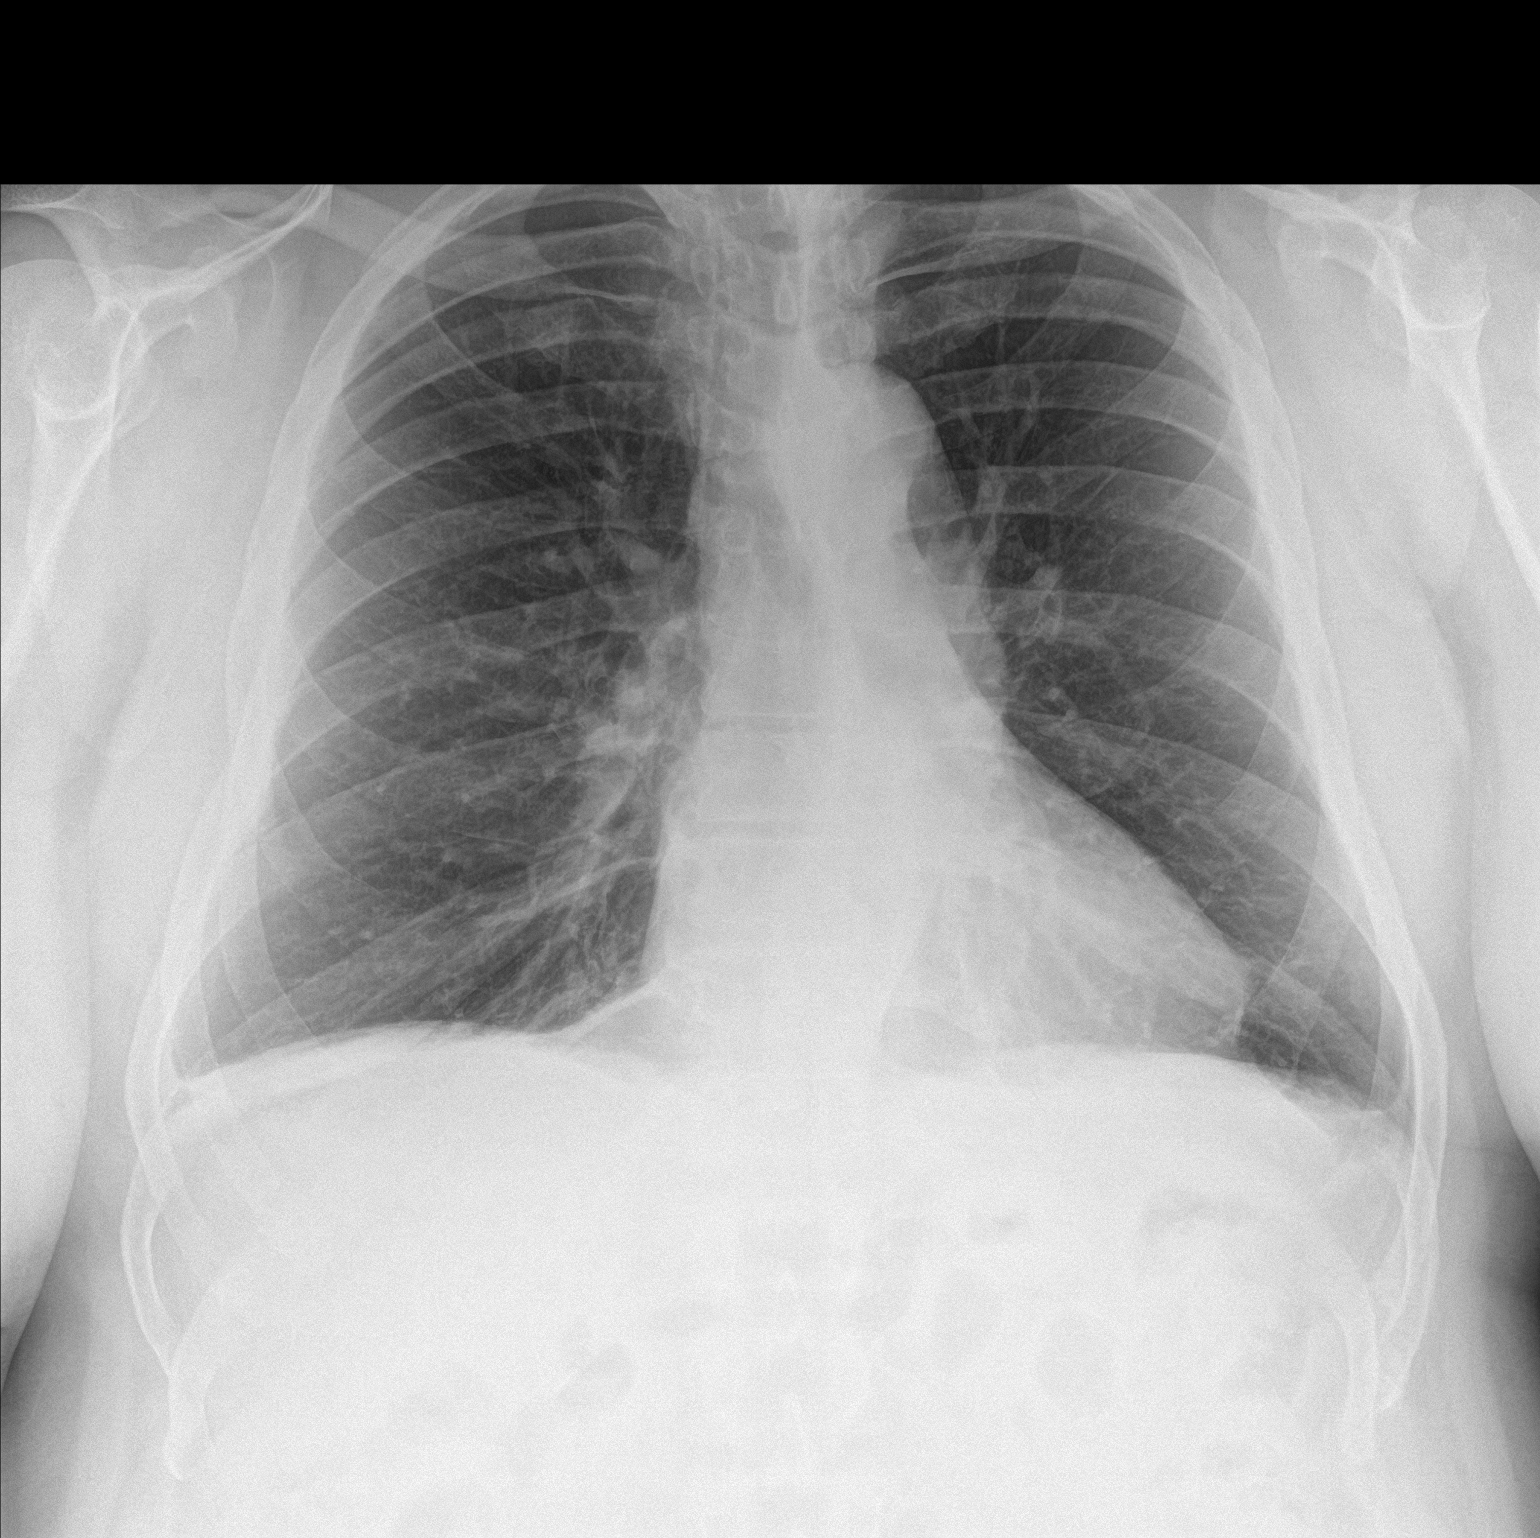

[chest lat]
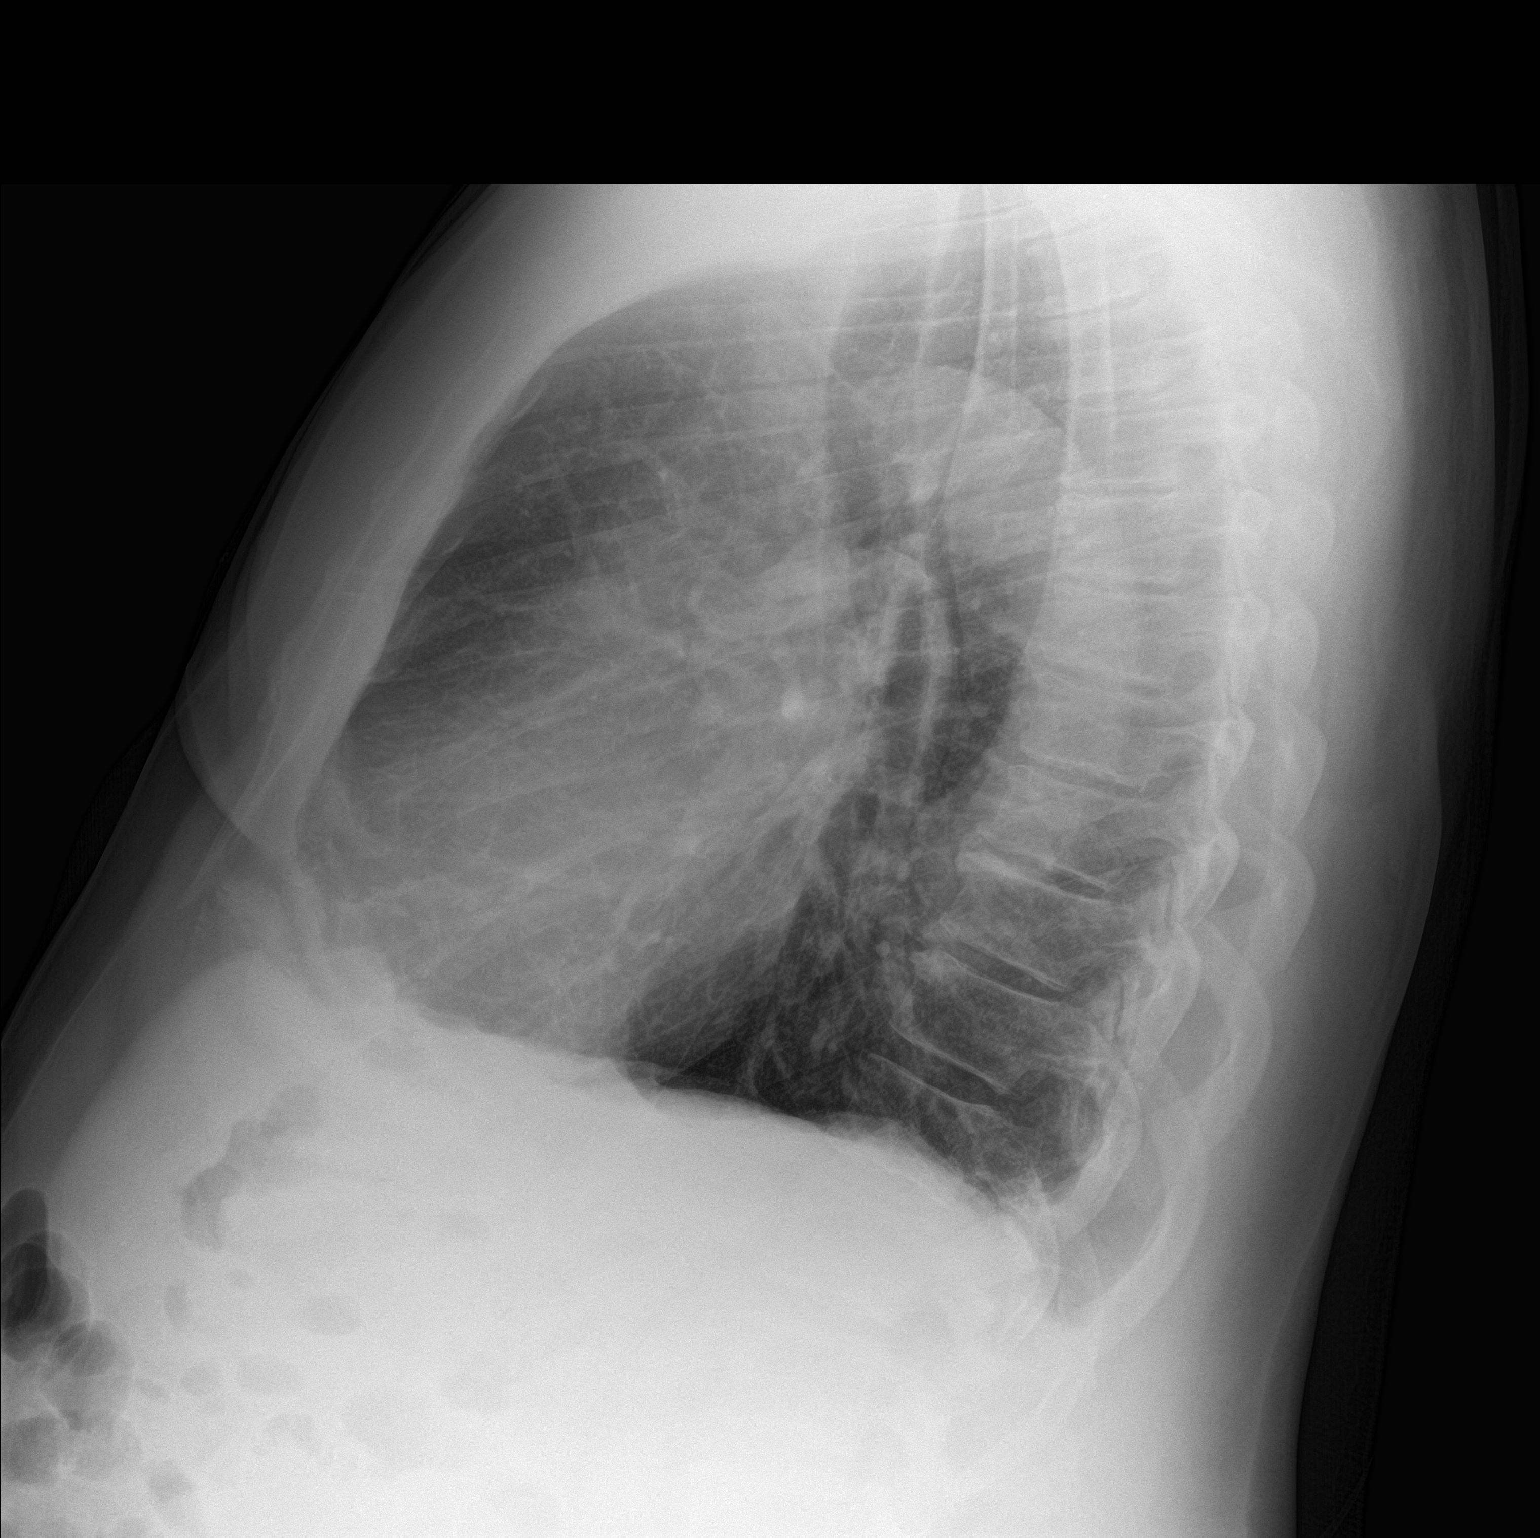

[2 of 2 positions shown; findings below may reference images not displayed]

FINDINGS: Mediastinum and hilar structures normal. Heart size normal. Low lung
volumes with mild bibasilar atelectasis. Tiny bilateral effusions
cannot be excluded. No pneumothorax.
IMPRESSION: Low lung volumes mild bibasilar atelectasis. Tiny bilateral pleural
effusions cannot be excluded.

## 2015-11-04 NOTE — Progress Notes (Signed)
Pre visit review using our clinic review tool, if applicable. No additional management support is needed unless otherwise documented below in the visit note. 

## 2015-11-04 NOTE — Telephone Encounter (Signed)
Caller name: Lynden Ang Relationship to patient: [employer] Can be reached: [phone]   Reason for call: [employer] needs a call back about orders on this patient

## 2015-11-04 NOTE — Progress Notes (Signed)
Subjective:    Patient ID: Brandon [name], male    DOB: 01-Feb-1941, 75 y.o.   MRN: [id number]  DOS:  11/04/2015 Type of visit - description : Acute visit Interval history: A week ago developed an asthma exacerbation with shortness of breath, wheezing, cough. Self treated with albuterol, now feels better but is concerned about possible pneumonia because he is still coughing some, has developed clear sputum and he is sore at the sides of the chest "from coughing". He did not seek medical attention, has not taken antibiotics or prednisone.   Review of Systems  No fevers, chills with the onset of the asthma exacerbation No sinus pain or congestion No nausea, vomiting, diarrhea No heartburn Past Medical History:  Diagnosis Date  . Asthma    Hx of childhood asthma, disappeared for a while, then resurfaced 6-7 years ago.   . Cardiomyopathy    with a negative cardiac catheterization in the past. (EF appriximately 40-45%)   . HTN (hypertension)    x 30 years  . Obesity, unspecified   . Sleep apnea    CPAP  . 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 None      Social History   Social History  . Marital status: Married    Spouse name: [name]  . Number of children: 3  . Years of education: N/A   Occupational History  . retired    Social History Main Topics  . Smoking status: Former Games developer  . Smokeless tobacco: Never Used     Comment: quit smoling in 1990. started when 18, 1 ppd  . Alcohol use Yes     Comment: occasionaly   . Drug use: Unknown  . Sexual activity: Not on file   Other Topics Concern  . Not on file   Social History Narrative   Grew up in [place], attended [employer]. First wife died of breast ca in 07-21-1997. 3 children. Remarried- 8 years. 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    Pt signed designated party release granting access to [facility] to his wife  [name]. Detailed message may be left on home or cell phone. Roselle Locus August 13, 2009 11:34 am.         Medication List       Accurate as of 11/04/15  4:53 PM. Always use your most recent med list.          ADVAIR DISKUS 500-50 MCG/DOSE Aepb Generic drug:  Fluticasone-Salmeterol INHALE ONE PUFF INTO LUNGS BY MOUTH TWICE DAILY   albuterol (2.5 MG/3ML) 0.083% nebulizer solution Commonly known as:  PROVENTIL Take 3 mLs (2.5 mg total) by nebulization every 6 (six) hours as needed for wheezing.   albuterol 108 (90 Base) MCG/ACT inhaler Commonly known as:  PROVENTIL HFA;VENTOLIN HFA Inhale 2 puffs into the lungs every 6 (six) hours as needed for wheezing or shortness of breath.   amLODipine 10 MG tablet Commonly known as:  NORVASC TAKE ONE TABLET BY MOUTH ONCE DAILY   aspirin 81 MG tablet Take 81 mg by mouth daily.   losartan 50 MG tablet Commonly known as:  COZAAR Take 1 tablet (50 mg total) by mouth 2 (two) times daily.   metoprolol succinate 25 MG 24 hr tablet Commonly known as:  TOPROL-XL Take 1 tablet (25 mg total) by mouth daily.   montelukast 10 MG tablet Commonly known as:  SINGULAIR TAKE  ONE TABLET BY MOUTH ONCE DAILY AT BEDTIME   sertraline 50 MG tablet Commonly known as:  ZOLOFT TAKE ONE TABLET BY MOUTH ONCE DAILY   spironolactone 25 MG tablet Commonly known as:  ALDACTONE Take 1 tablet (25 mg total) by mouth daily.          Objective:   Physical Exam BP 124/72 (BP Location: Left Arm, Patient Position: Sitting, Cuff Size: Normal)   Pulse 63   Temp 98.4 F (36.9 C) (Oral)   Resp 14   Ht 6' (1.829 m)   Wt 252 lb (114.3 kg)   SpO2 93%   BMI 34.18 kg/m  General:   Well developed, well nourished . NAD.  HEENT:  Normocephalic . Face symmetric, atraumatic. Nose is slightly congested Lungs:  few rhonchi, few end expiratory wheezes. Normal respiratory effort, no intercostal retractions, no accessory muscle use. Heart: RRR,  no murmur.  No  pretibial edema bilaterally  Skin: Not pale. Not jaundice Neurologic:  alert & oriented X3.  Speech normal, gait appropriate for age and unassisted Psych--  Cognition and judgment appear intact.  Cooperative with normal attention span and concentration.  Behavior appropriate. No anxious or depressed appearing.      Assessment & Plan:   75 year old gentleman with history of asthma, cardiomyopathy, hypertension, OSA on CPAP, obesity presents with asthma exacerbation, already resolving, no distress on exam. He still have some wheezing. Anticipate he will be back to normal with time, patient likes to be sure nothing serious is going on and requested a x-ray. Plan: Continue Advair, encourage use of albuterol as needed, Mucinex DM, chest x-ray, call if not better.

## 2015-11-04 NOTE — Patient Instructions (Signed)
Get the chest XR  mucinex DM twice a day as needed  advair daily  Albuterol for wheezing, persistent cough  If not back to normal in 1 week or you get worse, call the office

## 2015-11-04 NOTE — Telephone Encounter (Signed)
Pt being seen by Dr. Drue Novel today regarding asthma/pneumonia.

## 2015-11-12 ENCOUNTER — Ambulatory Visit: Payer: Medicare Other | Admitting: Family

## 2015-12-02 ENCOUNTER — Other Ambulatory Visit: Payer: Self-pay | Admitting: Family

## 2015-12-02 NOTE — Telephone Encounter (Signed)
90 day supply sertraline and singulair sent to pharmacy. Pt seen 11/12/15 for COPD exacerbation and has no follow up  with PCP.  Please advise when you would like to see pt in the office?

## 2015-12-03 NOTE — Telephone Encounter (Signed)
Pt is due for follow up please.  

## 2015-12-04 NOTE — Telephone Encounter (Signed)
Please schedule this patient an apt.      KP 

## 2015-12-04 NOTE — Telephone Encounter (Signed)
Pt has been scheduled for Tuesday 12/09/15 at 11:00

## 2015-12-09 ENCOUNTER — Encounter: Payer: Self-pay | Admitting: Family

## 2015-12-09 ENCOUNTER — Ambulatory Visit (INDEPENDENT_AMBULATORY_CARE_PROVIDER_SITE_OTHER): Payer: Medicare Other | Admitting: Family

## 2015-12-09 VITALS — BP 151/83 | HR 63 | Temp 98.8°F | Resp 18 | Ht 72.0 in | Wt 268.0 lb

## 2015-12-09 DIAGNOSIS — F32A Depression, unspecified: Secondary | ICD-10-CM | POA: Insufficient documentation

## 2015-12-09 DIAGNOSIS — F329 Major depressive disorder, single episode, unspecified: Secondary | ICD-10-CM

## 2015-12-09 DIAGNOSIS — R739 Hyperglycemia, unspecified: Secondary | ICD-10-CM

## 2015-12-09 DIAGNOSIS — J4540 Moderate persistent asthma, uncomplicated: Secondary | ICD-10-CM

## 2015-12-09 DIAGNOSIS — I10 Essential (primary) hypertension: Secondary | ICD-10-CM

## 2015-12-09 DIAGNOSIS — Z23 Encounter for immunization: Secondary | ICD-10-CM

## 2015-12-09 DIAGNOSIS — E785 Hyperlipidemia, unspecified: Secondary | ICD-10-CM | POA: Diagnosis not present

## 2015-12-09 DIAGNOSIS — G4733 Obstructive sleep apnea (adult) (pediatric): Secondary | ICD-10-CM

## 2015-12-09 DIAGNOSIS — E669 Obesity, unspecified: Secondary | ICD-10-CM | POA: Diagnosis not present

## 2015-12-09 LAB — BASIC METABOLIC PANEL
BUN: 20 mg/dL (ref 6–23)
CO2: 28 mEq/L (ref 19–32)
Calcium: 9.1 mg/dL (ref 8.4–10.5)
Chloride: 108 mEq/L (ref 96–112)
Creatinine, Ser: 1.38 mg/dL (ref 0.40–1.50)
GFR: 64.61 mL/min (ref >60.00)
Glucose, Bld: 100 mg/dL — ABNORMAL HIGH (ref 70–99)
POTASSIUM: 4.4 meq/L (ref 3.5–5.1)
Sodium: 140 mEq/L (ref 135–145)

## 2015-12-09 LAB — HEMOGLOBIN A1C: HEMOGLOBIN A1C: 5.7 % (ref 4.6–6.5)

## 2015-12-09 LAB — LIPID PANEL
Cholesterol: 149 mg/dL (ref 0–200)
HDL: 50.3 mg/dL (ref >39.00)
LDL Cholesterol: 86 mg/dL (ref 0–99)
NonHDL: 98.65
Total CHOL/HDL Ratio: 3
Triglycerides: 63 mg/dL (ref 0.0–149.0)
VLDL: 12.6 mg/dL (ref 0.0–40.0)

## 2015-12-09 MED ORDER — AMLODIPINE BESYLATE 10 MG PO TABS: 10.0000 mg | ORAL_TABLET | Freq: Every day | ORAL | 0 refills | Status: DC

## 2015-12-09 MED ORDER — SPIRONOLACTONE 25 MG PO TABS: 25.0000 mg | ORAL_TABLET | Freq: Every day | ORAL | 1 refills | Status: DC

## 2015-12-09 MED ORDER — FLUTICASONE-SALMETEROL 500-50 MCG/DOSE IN AEPB: INHALATION_SPRAY | RESPIRATORY_TRACT | 0 refills | Status: DC

## 2015-12-09 MED ORDER — METOPROLOL SUCCINATE ER 25 MG PO TB24
25.0000 mg | ORAL_TABLET | Freq: Every day | ORAL | 1 refills | Status: DC
Start: 2015-12-09 — End: 2016-08-23

## 2015-12-09 MED ORDER — LOSARTAN POTASSIUM 50 MG PO TABS: 50.0000 mg | ORAL_TABLET | Freq: Two times a day (BID) | ORAL | 1 refills | Status: DC

## 2015-12-09 NOTE — Progress Notes (Signed)
Subjective:    Patient ID: Brandon [name], male    DOB: [birth year], 75 y.o.   MRN: [id number]  HPI  [name] is a 75 yr old male who presents today for follow up  1) HTN-current BP medications include amlodipine, toprol xl 25mg , and aldactone. Denies cp/sob or swelling.   BP Readings from Last 3 Encounters:  12/09/15 (!) 151/83  11/04/15 124/72  04/08/15 126/80   2) Depression- maintained on zoloft.  Mood is good. Denies anxiety or insomnia.   3) Hyperglycemia-  Lab Results  Component Value Date   HGBA1C 6.0 02/26/2015   4) OSA-  Maintained on CPAP. Reports good compliance with cpap.   Lab Results  Component Value Date   CHOL 157 05/29/2014   HDL 42.90 05/29/2014   LDLCALC 100 (H) 05/29/2014   TRIG 72.0 05/29/2014   CHOLHDL 4 05/29/2014   Asthma- stable on advair and singulair.  Continues albuterol prn.  Review of Systems See HPI  Past Medical History:  Diagnosis Date  . Asthma    Hx of childhood asthma, disappeared for a while, then resurfaced 6-7 years ago.   . Cardiomyopathy    with a negative cardiac catheterization in the past. (EF appriximately 40-45%)   . HTN (hypertension)    x 30 years  . Obesity, unspecified   . Sleep apnea    CPAP  . 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 Marital status: Married    Spouse name: 05/31/2014  . Number of children: 3  . Years of education: N/A   Occupational History  . retired    Social History Main Topics  . Smoking status: Former [name]  . Smokeless tobacco: Never Used     Comment: quit smoling in 1990. started when 18, 1 ppd  . Alcohol use Yes     Comment: occasionaly   . Drug use: Unknown  . Sexual activity: Not on file   Other Topics Concern  . Not on file   Social History Narrative   Grew up in [place], attended [employer]. First wife died of breast ca in 06-30-1997. 3 children. Remarried- 8 years. 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    Pt signed designated party release granting access to [facility] to his wife [facility]. Detailed message may be left on home or cell phone. [name] August 13, 2009 11:34 am.     Past Surgical History:  Procedure Laterality Date  . None      Family History  Problem Relation Age of Onset  . Cancer Father     multiple melanoma  . Lupus Sister   . Asthma Daughter     Allergies  Allergen Reactions  . Ace Inhibitors     REACTION: cough  . Isosorb Dinitrate-Hydralazine     REACTION: dizziness/hypotensive    Current Outpatient Prescriptions on File Prior to Visit  Medication Sig Dispense Refill  . ADVAIR DISKUS 500-50 MCG/DOSE AEPB INHALE ONE PUFF INTO LUNGS BY MOUTH TWICE DAILY 180 each 0  . albuterol (PROVENTIL HFA;VENTOLIN HFA) 108 (90 Base) MCG/ACT inhaler Inhale 2 puffs into the lungs every 6 (six) hours as needed for wheezing or shortness of breath. 1 Inhaler 5  . albuterol (PROVENTIL) (2.5 MG/3ML) 0.083% nebulizer solution Take 3 mLs (2.5 mg total) by nebulization every 6 (six) hours as needed for wheezing. 75 mL 5  . amLODipine (NORVASC) 10  MG tablet TAKE ONE TABLET BY MOUTH ONCE DAILY 90 tablet 0  . aspirin 81 MG tablet Take 81 mg by mouth daily.      Marland Kitchen losartan (COZAAR) 50 MG tablet Take 1 tablet (50 mg total) by mouth 2 (two) times daily. 180 tablet 1  . metoprolol succinate (TOPROL-XL) 25 MG 24 hr tablet Take 1 tablet (25 mg total) by mouth daily. 90 tablet 1  . montelukast (SINGULAIR) 10 MG tablet TAKE ONE TABLET BY MOUTH ONCE DAILY AT BEDTIME 90 tablet 0  . sertraline (ZOLOFT) 50 MG tablet TAKE ONE TABLET BY MOUTH ONCE DAILY 90 tablet 0  . spironolactone (ALDACTONE) 25 MG tablet Take 1 tablet (25 mg total) by mouth daily. 90 tablet 1   No current facility-administered medications on file prior to visit.     BP (!) 151/83 (BP Location: Right Arm, Cuff Size: Large)   Pulse 63   Temp 98.8 F (37.1 C) (Oral)   Resp 18   Ht 6' (1.829 m)   Wt  268 lb (121.6 kg)   SpO2 98% Comment: room air  BMI 36.35 kg/m       Objective:   Physical Exam  Constitutional: He is oriented to person, place, and time. He appears well-developed and well-nourished. No distress.  HENT:  Head: Normocephalic and atraumatic.  Cardiovascular: Normal rate and regular rhythm.   No murmur heard. Pulmonary/Chest: Effort normal and breath sounds normal. No respiratory distress. He has no wheezes. He has no rales.  Musculoskeletal: He exhibits no edema.  Neurological: He is alert and oriented to person, place, and time.  Skin: Skin is warm and dry.  Psychiatric: He has a normal mood and affect. His behavior is normal. Thought content normal.          Assessment & Plan:

## 2015-12-09 NOTE — Assessment & Plan Note (Signed)
Clinically stable. Obtain A1c.

## 2015-12-09 NOTE — Assessment & Plan Note (Signed)
Slightly elevated today- acceptable for his age. Will continue to monitor on current meds.

## 2015-12-09 NOTE — Assessment & Plan Note (Signed)
Stable on zoloft. Continue same.  

## 2015-12-09 NOTE — Assessment & Plan Note (Signed)
Stable on current meds.  Continue same. 

## 2015-12-09 NOTE — Progress Notes (Signed)
Pre visit review using our clinic review tool, if applicable. No additional management support is needed unless otherwise documented below in the visit note. 

## 2015-12-09 NOTE — Assessment & Plan Note (Signed)
Clinically stable. Continue cpap.

## 2015-12-09 NOTE — Patient Instructions (Addendum)
Please complete lab work prior to leaving.   

## 2016-01-27 ENCOUNTER — Other Ambulatory Visit: Payer: Self-pay | Admitting: Family

## 2016-03-09 ENCOUNTER — Other Ambulatory Visit: Payer: Self-pay | Admitting: Family

## 2016-03-17 ENCOUNTER — Other Ambulatory Visit: Payer: Self-pay | Admitting: Family

## 2016-04-28 ENCOUNTER — Telehealth: Payer: Self-pay | Admitting: Family

## 2016-04-28 NOTE — Telephone Encounter (Signed)
Caller name: Relationship to patient: Self Can be reached: [phone] Pharmacy:  Reason for call: Patient called to inform provider that he can no longer afford his Advair Inhaler. States it will cost him $334. Patient inquiring as to if provider has a way of getting this medication to him at no cost or if there is another inhaler that can be prescribed that will be less expensive. Patient is calling his insurance company to see if there is another inhaler he can get that they will cover. Plse adv

## 2016-04-29 ENCOUNTER — Telehealth: Payer: Self-pay | Admitting: Family

## 2016-04-29 ENCOUNTER — Ambulatory Visit (HOSPITAL_BASED_OUTPATIENT_CLINIC_OR_DEPARTMENT_OTHER)
Admission: RE | Admit: 2016-04-29 | Discharge: 2016-04-29 | Disposition: A | Discharge status: Home / Self Care | Payer: Medicare Other | Source: Ambulatory Visit | Attending: Medical | Admitting: Medical

## 2016-04-29 ENCOUNTER — Encounter: Payer: Self-pay | Admitting: Medical

## 2016-04-29 ENCOUNTER — Ambulatory Visit (INDEPENDENT_AMBULATORY_CARE_PROVIDER_SITE_OTHER): Payer: Medicare Other | Admitting: Medical

## 2016-04-29 VITALS — BP 148/78 | HR 84 | Temp 98.6°F | Ht 72.0 in | Wt 260.0 lb

## 2016-04-29 DIAGNOSIS — R05 Cough: Secondary | ICD-10-CM

## 2016-04-29 DIAGNOSIS — M4183 Other forms of scoliosis, cervicothoracic region: Secondary | ICD-10-CM | POA: Diagnosis not present

## 2016-04-29 DIAGNOSIS — M25512 Pain in left shoulder: Secondary | ICD-10-CM

## 2016-04-29 DIAGNOSIS — J4520 Mild intermittent asthma, uncomplicated: Secondary | ICD-10-CM | POA: Diagnosis not present

## 2016-04-29 DIAGNOSIS — M542 Cervicalgia: Secondary | ICD-10-CM | POA: Diagnosis not present

## 2016-04-29 DIAGNOSIS — R062 Wheezing: Secondary | ICD-10-CM | POA: Diagnosis not present

## 2016-04-29 DIAGNOSIS — R059 Cough, unspecified: Secondary | ICD-10-CM

## 2016-04-29 DIAGNOSIS — M778 Other enthesopathies, not elsewhere classified: Secondary | ICD-10-CM | POA: Insufficient documentation

## 2016-04-29 DIAGNOSIS — M47812 Spondylosis without myelopathy or radiculopathy, cervical region: Secondary | ICD-10-CM | POA: Insufficient documentation

## 2016-04-29 IMAGING — CR DG CERVICAL SPINE COMPLETE 4+V
6 series · 6 of 6 positions shown · non-contrast
Comparison: None.

CLINICAL DATA: Four week history of cervicalgia

EXAM:
CERVICAL SPINE - COMPLETE 4+ VIEW

[w c-spine lat]
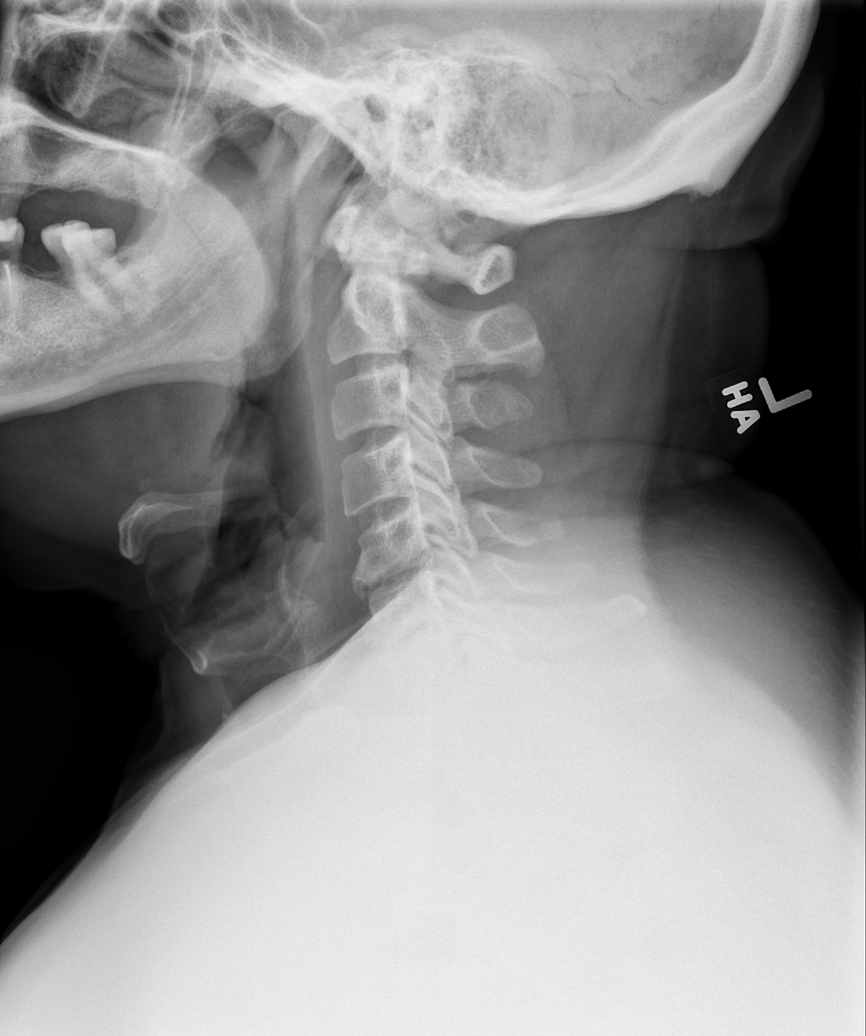

[w c-spine oblique * (1 of 2)]
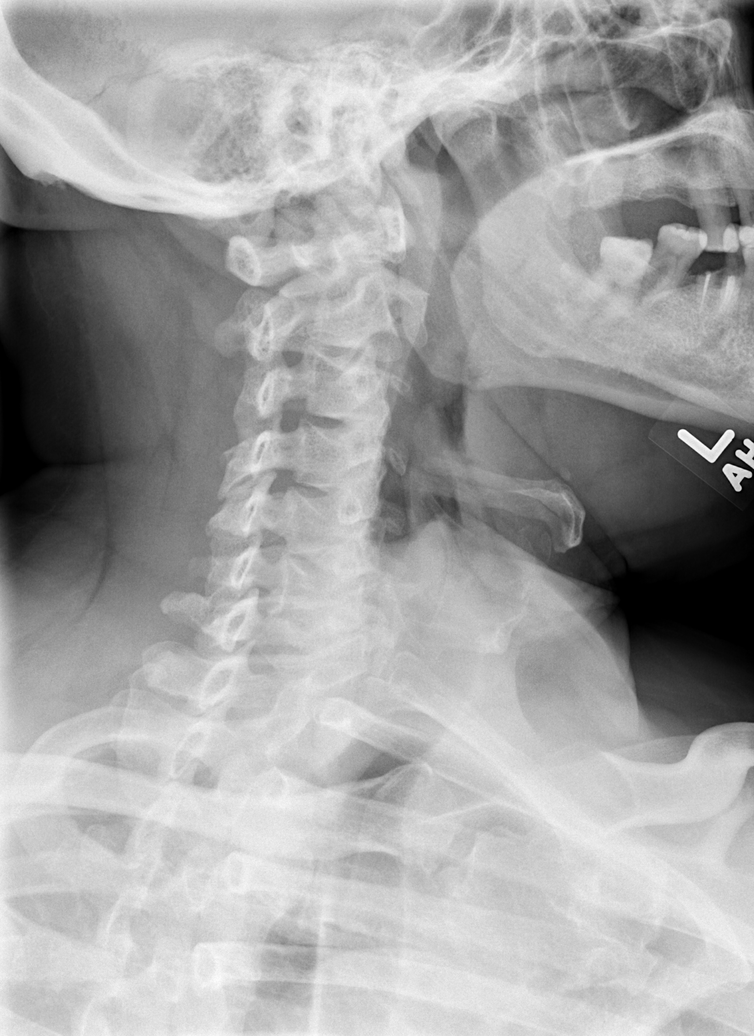

[w c-spine oblique * (2 of 2)]
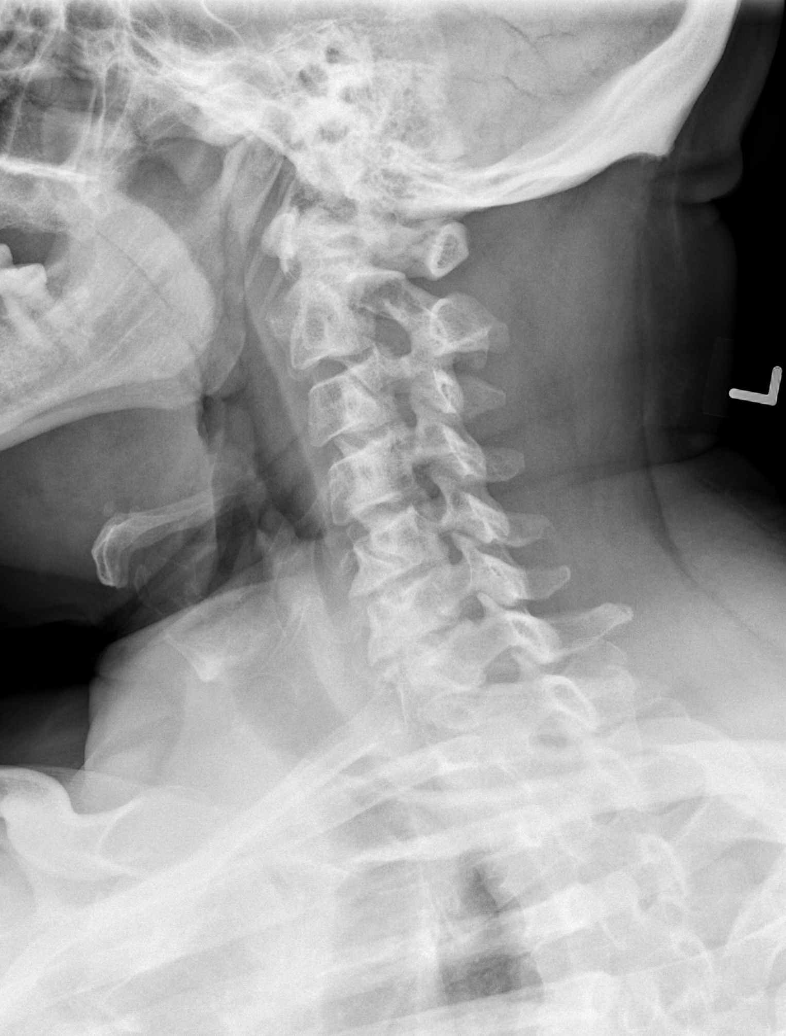

[w c-spine a.p.]
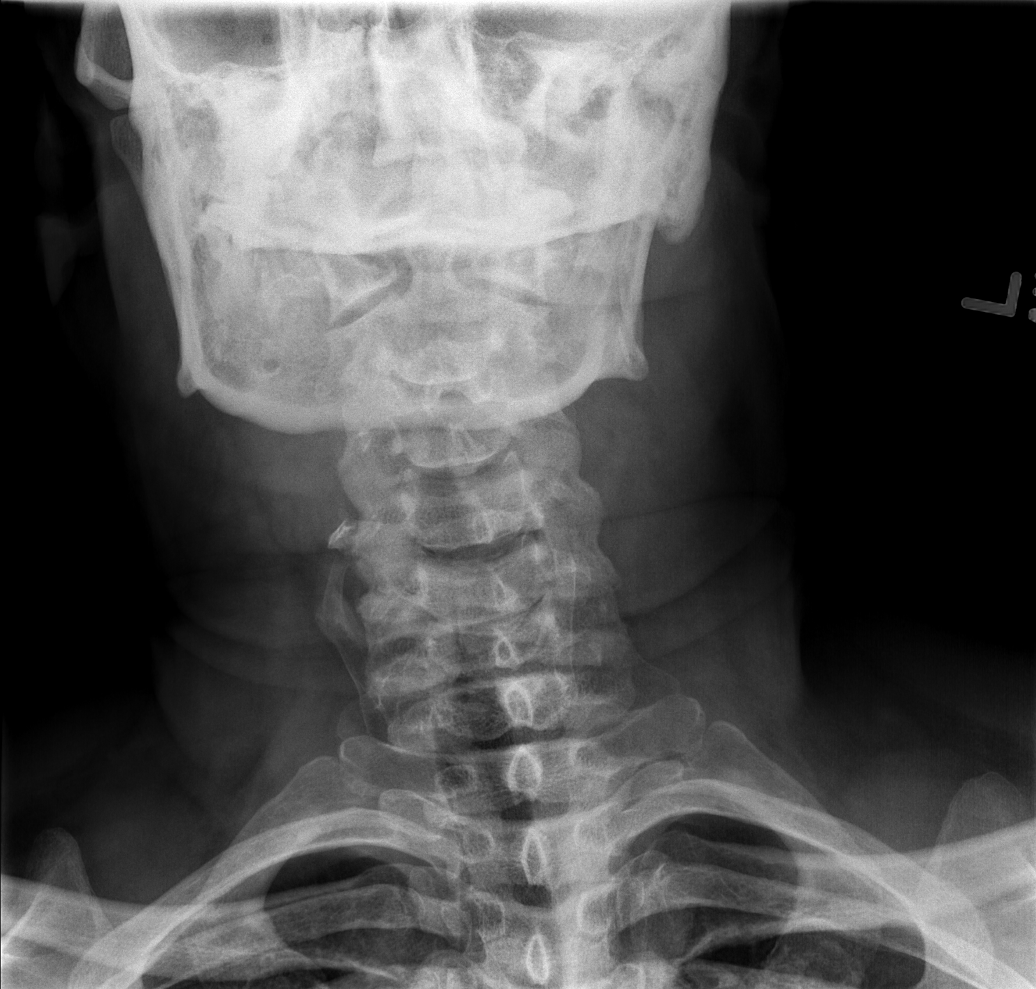

[w c-spine odontoid]
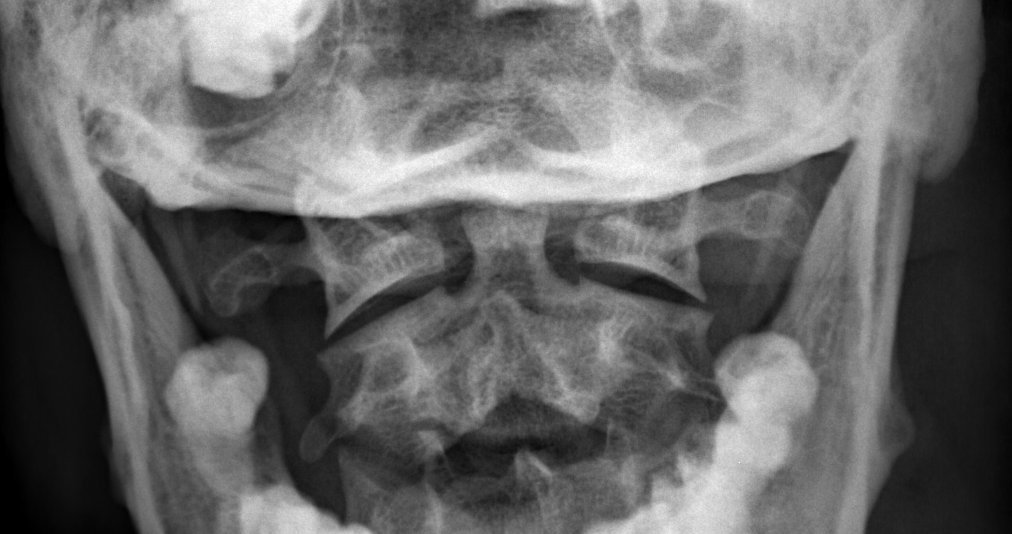

[w swimmers view]
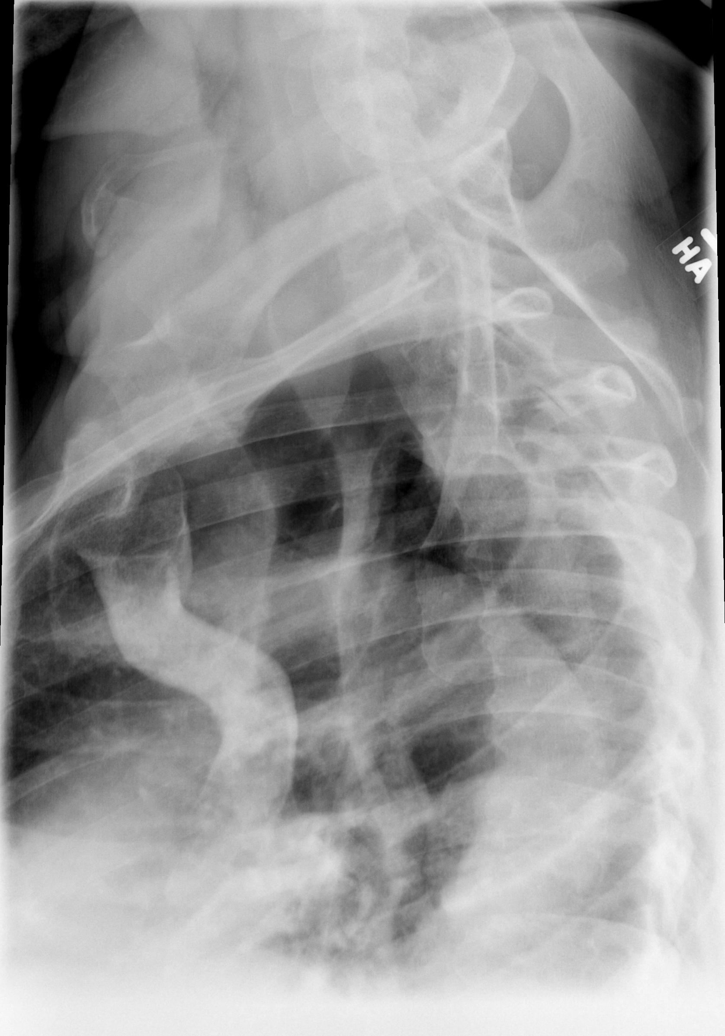

[6 of 6 positions shown; findings below may reference images not displayed]

FINDINGS: Frontal, lateral, open-mouth odontoid, and bilateral oblique views
were obtained. There is no fracture or spondylolisthesis.
Prevertebral soft tissues and predental space regions are normal.
There is moderately severe disc space narrowing at C5-6 and C6-7.
There is exit foraminal narrowing due to bony hypertrophy on the
left at C4-5 and C5-6. There is cervicothoracic levoscoliosis. Lung
apices are clear.
IMPRESSION: Areas of osteoarthritic change. Scoliosis. No fracture or
spondylolisthesis.

## 2016-04-29 IMAGING — CR DG CHEST 2V
2 series · 2 of 2 positions shown · non-contrast
Comparison: Chest radiograph [DATE]

CLINICAL DATA: Wheezing and cough

EXAM:
CHEST  2 VIEW

[w chest pa]
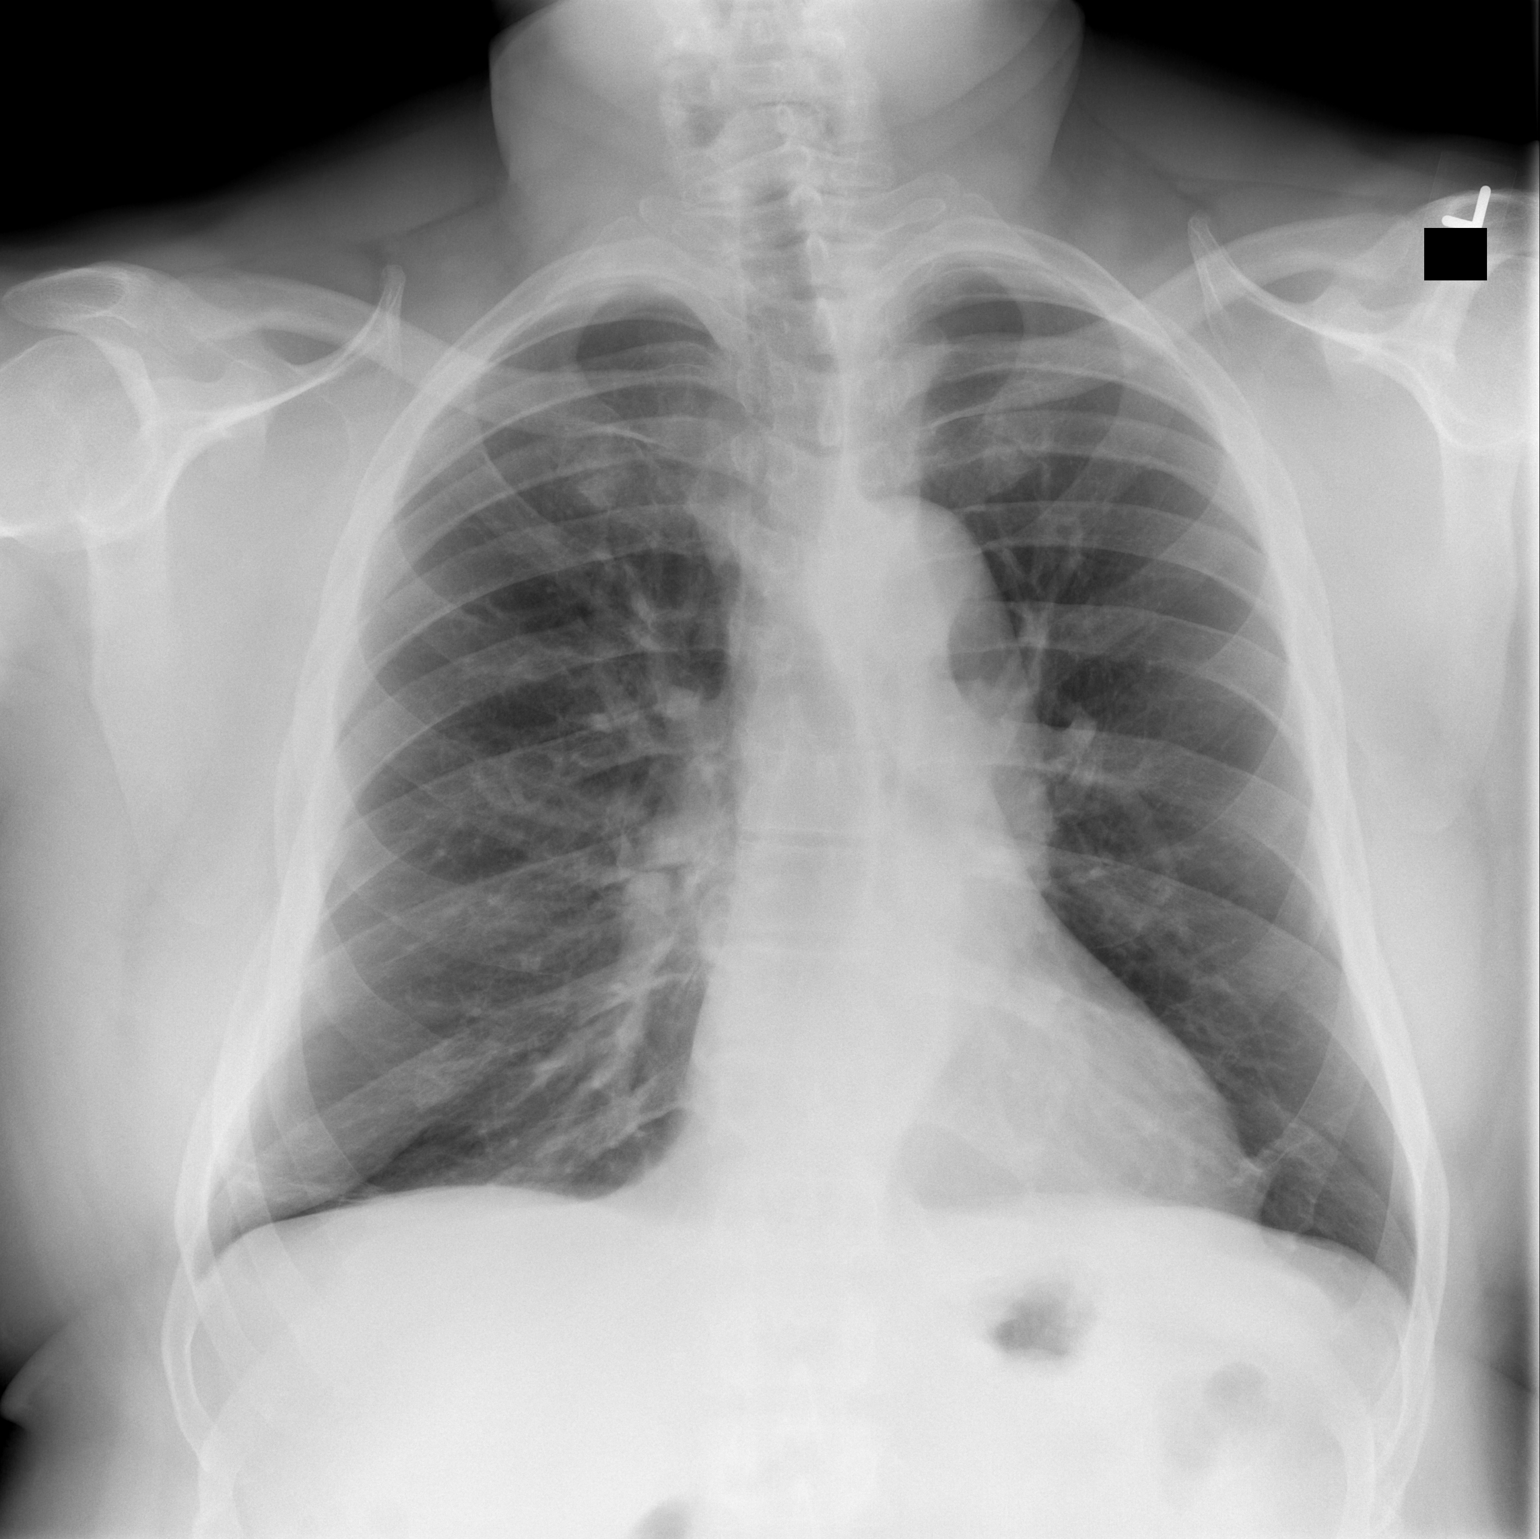

[w chest lat]
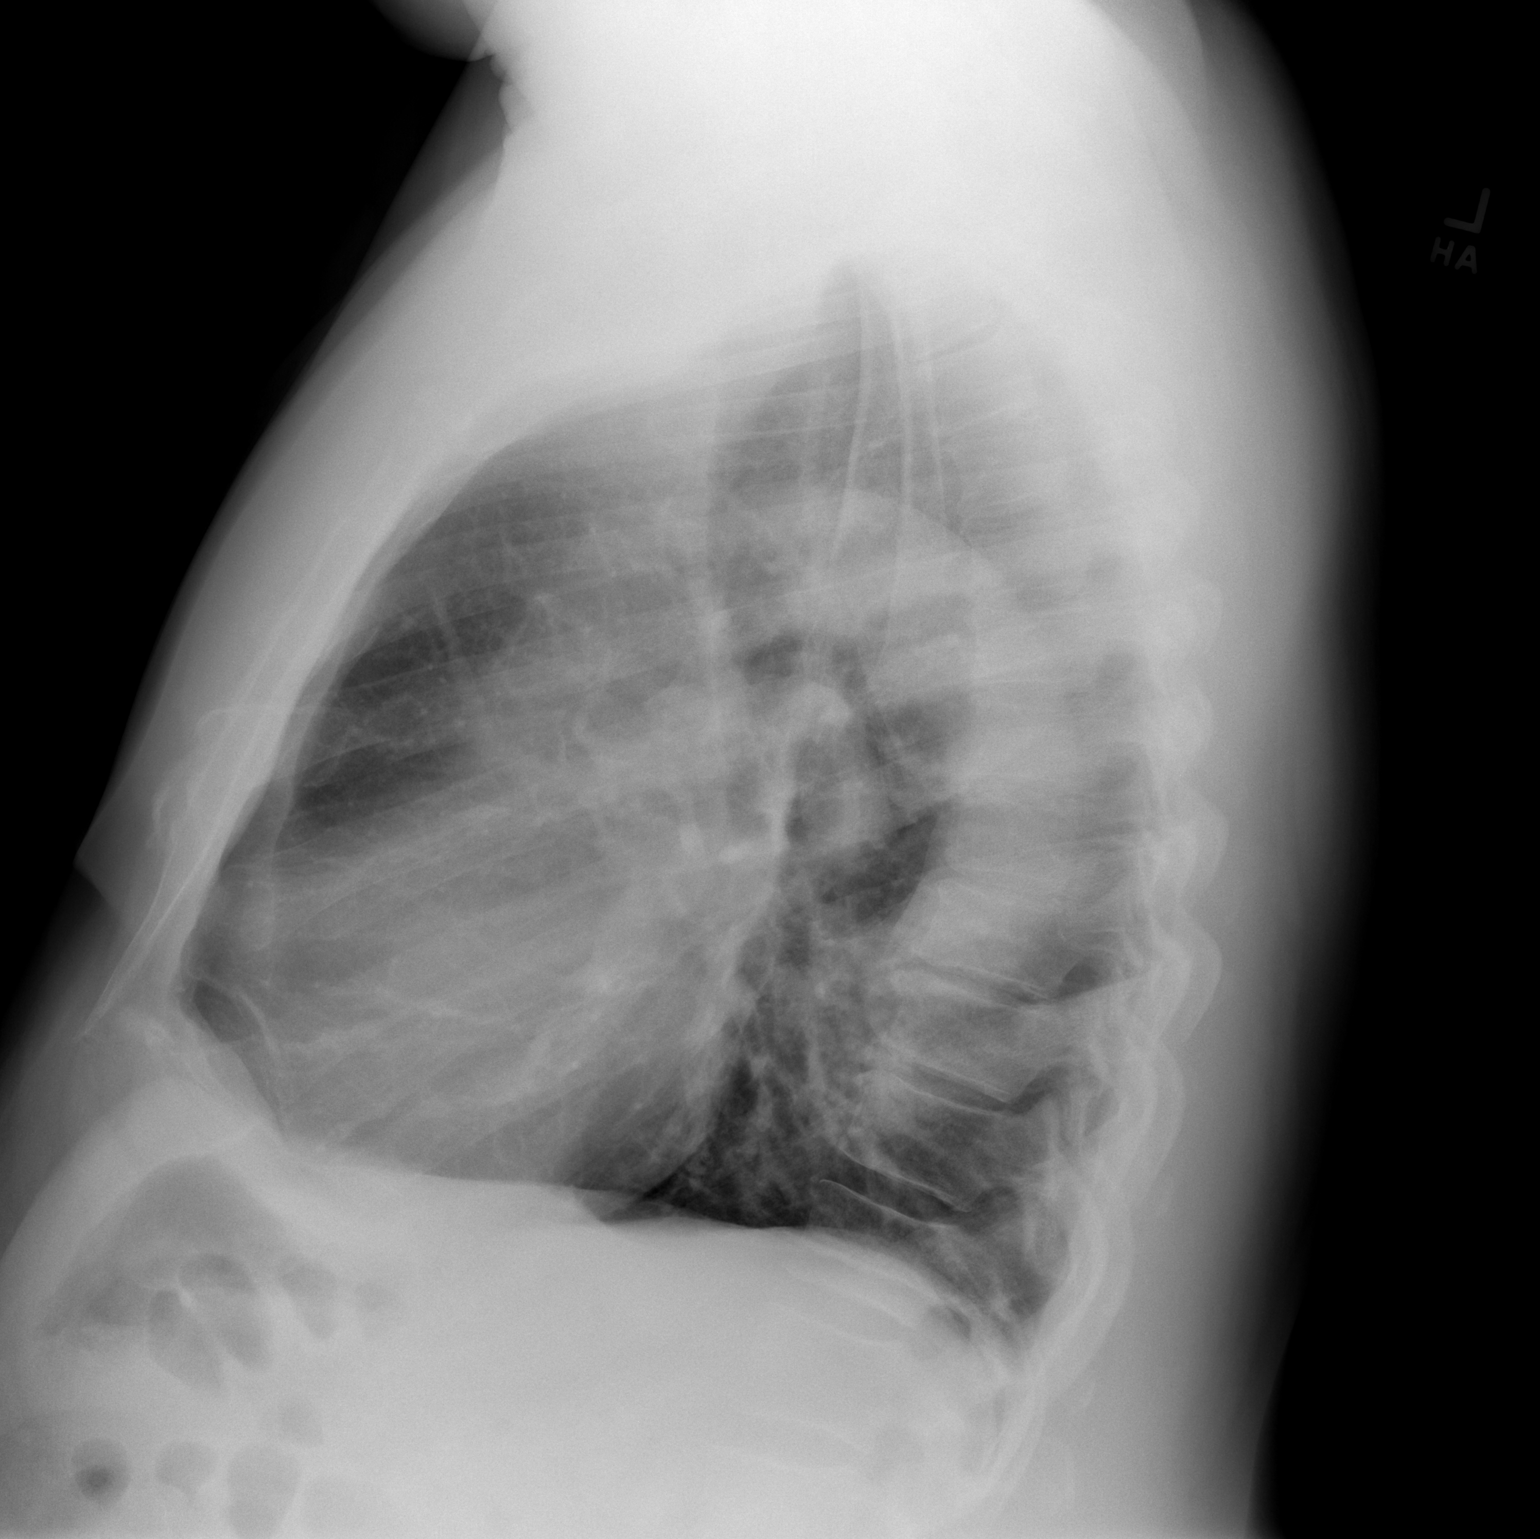

[2 of 2 positions shown; findings below may reference images not displayed]

FINDINGS: The lungs are well inflated. Cardiomediastinal contours are normal.
No pneumothorax or pleural effusion. No focal airspace consolidation
or pulmonary edema.
IMPRESSION: Clear lungs.

## 2016-04-29 MED ORDER — AZITHROMYCIN 250 MG PO TABS: ORAL_TABLET | ORAL | 0 refills | Status: DC

## 2016-04-29 MED ORDER — FLUTICASONE-SALMETEROL 500-50 MCG/DOSE IN AEPB: INHALATION_SPRAY | RESPIRATORY_TRACT | 0 refills | Status: DC

## 2016-04-29 MED ORDER — PREDNISONE 10 MG PO TABS: ORAL_TABLET | ORAL | 0 refills | Status: DC

## 2016-04-29 MED ORDER — ALBUTEROL SULFATE (2.5 MG/3ML) 0.083% IN NEBU: 2.5000 mg | INHALATION_SOLUTION | Freq: Four times a day (QID) | RESPIRATORY_TRACT | 5 refills | Status: DC | PRN

## 2016-04-29 MED ORDER — BENZONATATE 100 MG PO CAPS: 100.0000 mg | ORAL_CAPSULE | Freq: Three times a day (TID) | ORAL | 0 refills | Status: DC | PRN

## 2016-04-29 MED ORDER — ALBUTEROL SULFATE HFA 108 (90 BASE) MCG/ACT IN AERS: 2.0000 | INHALATION_SPRAY | Freq: Four times a day (QID) | RESPIRATORY_TRACT | 5 refills | Status: DC | PRN

## 2016-04-29 NOTE — Progress Notes (Signed)
Pre visit review using our clinic review tool, if applicable. No additional management support is needed unless otherwise documented below in the visit note. 

## 2016-04-29 NOTE — Telephone Encounter (Signed)
Patient Name: Brandon [name] DOB: Jul 15, 1940 Initial Comment Caller having difficulty breathing, HX of Asthma , coughing up clear mucus. Nurse Assessment Nurse: Elijah Birk, RN, Lynda Date/Time (Eastern Time): 04/29/2016 12:49:29 PM Confirm and document reason for call. If symptomatic, describe symptoms. ---Caller is having difficulty breathing, hx. of Asthma, out of rx. (Advair) for a couple weeks, can't pay for it out of pocket right now. He is using Albuterol nebulizer, Singulair & Ventolin, coughing up clear mucus. No fever. Using rxs every 6 hours. Does the patient have any new or worsening symptoms? ---Yes Will a triage be completed? ---Yes Related visit to physician within the last 2 weeks? ---No Does the PT have any chronic conditions? (i.e. diabetes, asthma, etc.) ---Yes List chronic conditions. ---asthma, on BP rx Is this a behavioral health or substance abuse call? ---No Guidelines Guideline Title Affirmed Question Affirmed Notes Asthma Attack [1] MILD asthma attack (e.g., no SOB at rest, mild SOB with walking, speaks normally in sentences, mild wheezing) AND [2] persists > 24 hours on appropriate treatment Final Disposition User See Physician within 24 Hours [place], RN, [employer] states he is not SOB now as he speaks with nurse, but can feel it is still there (asthma symptoms) and has been wheezing on occasion. He states the Advair keeps asthma controlled, but has not had the rx for a couple weeks now & thought he could get through until SS $ came, but is finding it hard and concerned he could have pneumonia developing. Referrals REFERRED TO PCP OFFICE Disagree/Comply: Comply

## 2016-04-29 NOTE — Telephone Encounter (Signed)
Patient called requesting an appointment with [facility] for asthma. He stated that he knew Efraim Kaufmann was out of the office today but would take an appointment tomorrow 04/30/16. When asked if he was having difficulty breathing now or if he was okay he stated that he was having some difficulty breathing. Patient sent to team health to be triaged.

## 2016-04-29 NOTE — Telephone Encounter (Signed)
Pt has need for advair inhaler. Pt cost at this time over $300 dollars. Does pulmonologist have adviar or symbicort to spare. Pt just needs one inhaler while he gets more funds.
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
## 2016-04-29 NOTE — Telephone Encounter (Signed)
Patient has an appointment scheduled today for 4:45 PM with Esperanza Richters, PA-C.

## 2016-04-29 NOTE — Patient Instructions (Addendum)
For recent wheeze we refilled your albuterol inhaler and the solution for neb machine.  Also sent in advair rx. I want you to call Ashlee RN tomorrow late morning and see if she was successful in getting sample inhaler of advair or equivalent.   If Ashlee not succesful and med still to expensive then will rx very low dose prednisone for 3 days.  Also making azithromycin available if you get worse infection type symptoms or if cxr positive.  For your lt shoulder pain and neck pain will put in xray. Will include chest xray today for you cough.  For cough rx benzonatate.  Follow up in 7 days or as needed

## 2016-04-30 ENCOUNTER — Telehealth: Payer: Self-pay | Admitting: Family

## 2016-04-30 ENCOUNTER — Telehealth: Payer: Self-pay

## 2016-04-30 MED ORDER — FLUTICASONE-SALMETEROL 500-50 MCG/DOSE IN AEPB: INHALATION_SPRAY | RESPIRATORY_TRACT | 0 refills | Status: DC

## 2016-04-30 NOTE — Telephone Encounter (Signed)
Noted.  Spoke to patient. See phone note (04/28/16).

## 2016-04-30 NOTE — Telephone Encounter (Signed)
Morrie Sheldon will you see if pulmonlogist office has advair sample to give pt? See my note yesterday. Also sent you a note yesterday. Thanks for your help. Update pt either way if no sample available or if they are already closed for weekend.

## 2016-04-30 NOTE — Telephone Encounter (Signed)
Received call from [facility] at [facility]. She has 1 advair sample that pt can pick up. Left detailed message on pt's cell# that he can pick up sample on the 2nd floor at pulmonology until 5 today and to call if any further questions.

## 2016-04-30 NOTE — Telephone Encounter (Signed)
Attempted to reach [facility] on call back number [phone], nor was I able to leave a vm. Called the main office number at [phone] and the front desk staff was able to transfer me [name]'s nurse. Informed her we did have a sample that we could give to the pt, she stated she would contact the pt and make him aware the he has a sample here at our office to pick up. I did make sure with Shanda Bumps if it was ok to give sample, she gave the ok. Sample placed up front. Nothing further is needed.

## 2016-04-30 NOTE — Telephone Encounter (Signed)
°  Relation to FV:CBSW Call back number:[phone]   Reason for call:  Patient was seen 04/29/16 by Ramon Dredge and was advised to call and ask to speak with Ashlee regarding "AVAIR" samples, please advise

## 2016-04-30 NOTE — Telephone Encounter (Addendum)
Called patient.  He states by next week he will have met his deductible.  Advair sample needed until he can meet his deductible. Called Dr. Lanny Hurst Clance's office, and spoke to [facility], she stated she would send a message back to nurse to check for sample.  Awaiting call back.

## 2016-06-02 ENCOUNTER — Other Ambulatory Visit: Payer: Self-pay | Admitting: Family

## 2016-06-18 ENCOUNTER — Encounter: Payer: Self-pay | Admitting: Family Medicine

## 2016-06-18 ENCOUNTER — Other Ambulatory Visit: Payer: Self-pay | Admitting: Emergency Medicine

## 2016-06-18 ENCOUNTER — Ambulatory Visit (INDEPENDENT_AMBULATORY_CARE_PROVIDER_SITE_OTHER): Payer: Medicare Other | Admitting: Family Medicine

## 2016-06-18 VITALS — BP 132/70 | HR 63 | Temp 98.6°F | Resp 12 | Ht 72.0 in | Wt 266.8 lb

## 2016-06-18 DIAGNOSIS — M549 Dorsalgia, unspecified: Secondary | ICD-10-CM

## 2016-06-18 MED ORDER — PREDNISONE 10 MG PO TABS: ORAL_TABLET | ORAL | 0 refills | Status: DC

## 2016-06-18 MED ORDER — TIZANIDINE HCL 4 MG PO TABS: 4.0000 mg | ORAL_TABLET | Freq: Four times a day (QID) | ORAL | 0 refills | Status: DC | PRN

## 2016-06-18 MED ORDER — TRAMADOL HCL 50 MG PO TABS: 50.0000 mg | ORAL_TABLET | Freq: Three times a day (TID) | ORAL | 0 refills | Status: DC | PRN

## 2016-06-18 NOTE — Progress Notes (Signed)
Pre visit review using our clinic review tool, if applicable. No additional management support is needed unless otherwise documented below in the visit note. 

## 2016-06-18 NOTE — Patient Instructions (Signed)

## 2016-06-18 NOTE — Progress Notes (Signed)
Patient ID: Brandon [name], male   DOB: August 29, 1940, 76 y.o.   MRN: [id number]     Subjective:  I acted as a Neurosurgeon for Dr. Zola Button.  Apolonio Schneiders, CMA   Patient ID: Brandon [name], male    DOB: 23-Jun-1940, 28 y.o.   MRN: [id number]  Chief Complaint  Patient presents with  . Leg Pain    Leg Pain   There was no injury mechanism. The pain is present in the left leg. The quality of the pain is described as shooting. The pain is at a severity of 9/10. The pain is severe. The pain has been constant since onset. Associated symptoms include an inability to bear weight, numbness and tingling. Pertinent negatives include no loss of motion, loss of sensation or muscle weakness. The symptoms are aggravated by movement and weight bearing. He has tried acetaminophen for the symptoms. The treatment provided no relief.    Patient is in today for right leg pain for 3 days.  Left hip pain that shoots down leg.    Patient Care Team: [name], NP as PCP - General (Internal Medicine)   Past Medical History:  Diagnosis Date  . Asthma    Hx of childhood asthma, disappeared for a while, then resurfaced 6-7 years ago.   . Cardiomyopathy    with a negative cardiac catheterization in the past. (EF appriximately 40-45%)   . HTN (hypertension)    x 30 years  . Obesity, unspecified   . Sleep apnea    CPAP  . 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 None      Family History  Problem Relation Age of Onset  . Cancer Father     multiple melanoma  . Lupus Sister   . Asthma Daughter     Social History   Social History  . Marital status: Married    Spouse name: [name]  . Number of children: 3  . Years of education: N/A   Occupational History  . retired    Social History Main Topics  . Smoking status: Former Games developer  . Smokeless tobacco: Never Used     Comment: quit smoling in 1990. started when 18, 1 ppd  .  Alcohol use Yes     Comment: occasionaly   . Drug use: Unknown  . Sexual activity: Not on file   Other Topics Concern  . Not on file   Social History Narrative   Grew up in [place], attended University HS. First wife died of breast ca in 07/18/97. 3 children. Remarried- 8 years. 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    Pt signed designated party release granting access to [facility] to his wife [name]. Detailed message may be left on home or cell phone. Roselle Locus August 13, 2009 11:34 am.     Outpatient Medications Prior to Visit  Medication Sig Dispense Refill  . albuterol (PROVENTIL HFA;VENTOLIN HFA) 108 (90 Base) MCG/ACT inhaler Inhale 2 puffs into the lungs every 6 (six) hours as needed for wheezing or shortness of breath. 1 Inhaler 5  . albuterol (PROVENTIL) (2.5 MG/3ML) 0.083% nebulizer solution Take 3 mLs (2.5 mg total) by nebulization every 6 (six) hours as needed for wheezing. 75 mL 5  . amLODipine (NORVASC) 10 MG tablet TAKE ONE TABLET BY MOUTH ONCE DAILY 90 tablet 0  . aspirin 81 MG tablet Take 81 mg by mouth daily.      Marland Kitchen  Fluticasone-Salmeterol (ADVAIR DISKUS) 500-50 MCG/DOSE AEPB INHALE ONE PUFF INTO LUNGS BY MOUTH TWICE DAILY 14 each 0  . losartan (COZAAR) 50 MG tablet Take 1 tablet (50 mg total) by mouth 2 (two) times daily. 180 tablet 1  . metoprolol succinate (TOPROL-XL) 25 MG 24 hr tablet Take 1 tablet (25 mg total) by mouth daily. 90 tablet 1  . montelukast (SINGULAIR) 10 MG tablet TAKE ONE TABLET BY MOUTH ONCE DAILY AT BEDTIME 90 tablet 0  . sertraline (ZOLOFT) 50 MG tablet TAKE ONE TABLET BY MOUTH ONCE DAILY 90 tablet 0  . spironolactone (ALDACTONE) 25 MG tablet Take 1 tablet (25 mg total) by mouth daily. 90 tablet 1  . predniSONE (DELTASONE) 10 MG tablet 3 tab po day 1, 2 tab po day 2, 1 tab po day 3 6 tablet 0  . azithromycin (ZITHROMAX) 250 MG tablet Take 2 tablets by mouth on day 1, followed by 1 tablet by mouth daily for 4 days. 6 tablet 0    . benzonatate (TESSALON) 100 MG capsule Take 1 capsule (100 mg total) by mouth 3 (three) times daily as needed for cough. 21 capsule 0   No facility-administered medications prior to visit.     Allergies  Allergen Reactions  . Ace Inhibitors     REACTION: cough  . Isosorb Dinitrate-Hydralazine     REACTION: dizziness/hypotensive    Review of Systems  Constitutional: Negative for fever and malaise/fatigue.  HENT: Negative for congestion.   Eyes: Negative for blurred vision.  Respiratory: Negative for cough and shortness of breath.   Cardiovascular: Negative for chest pain, palpitations and leg swelling.  Gastrointestinal: Negative for vomiting.  Musculoskeletal: Negative for back pain.  Skin: Negative for rash.  Neurological: Positive for tingling and numbness. Negative for loss of consciousness and headaches.       Objective:    Physical Exam  Constitutional: He is oriented to person, place, and time. He appears well-developed and well-nourished. No distress.  HENT:  Head: Normocephalic and atraumatic.  Eyes: Conjunctivae are normal.  Neck: Normal range of motion. No thyromegaly present.  Cardiovascular: Normal rate and regular rhythm.   Pulmonary/Chest: Effort normal. He has no wheezes.  Abdominal: Soft. Bowel sounds are normal. There is no tenderness.  Musculoskeletal: Normal range of motion. He exhibits no edema or deformity.  Neurological: He is alert and oriented to person, place, and time. He has normal reflexes.  SLR positive on left leg 45 degrees and neg on right leg. Weakness L leg with hip flexion and low leg ext  Skin: Skin is warm and dry. He is not diaphoretic.  Psychiatric: He has a normal mood and affect.    BP 132/70 (BP Location: Left Arm, Cuff Size: Normal)   Pulse 63   Temp 98.6 F (37 C) (Oral)   Resp (!) 5   Ht 6' (1.829 m)   Wt 266 lb 12.8 oz (121 kg)   SpO2 98%   BMI 36.18 kg/m  Wt Readings from Last 3 Encounters:  06/18/16 266 lb 12.8  oz (121 kg)  04/29/16 260 lb (117.9 kg)  12/09/15 268 lb (121.6 kg)   BP Readings from Last 3 Encounters:  06/18/16 132/70  04/29/16 (!) 148/78  12/09/15 (!) 151/83     Immunization History  Administered Date(s) Administered  . Influenza Split 02/24/2011, 12/03/2011  . Influenza Whole 03/16/2007, 12/29/2009  . Influenza, High Dose Seasonal PF 02/26/2015, 12/09/2015  . Influenza,inj,Quad PF,36+ Mos 02/13/2013, 11/14/2013  . Pneumococcal Conjugate-13 11/14/2013  .  Pneumococcal Polysaccharide-23 08/13/2009  . Td 07/26/2006  . Tdap 09/22/2012  . Zoster 05/17/2011    Health Maintenance  Topic Date Due  . INFLUENZA VACCINE  10/13/2016  . COLONOSCOPY  08/20/2017  . TETANUS/TDAP  09/23/2022  . PNA vac Low Risk Adult  Completed    Lab Results  Component Value Date   WBC 6.8 06/14/2014   HGB 13.7 06/14/2014   HCT 40.1 06/14/2014   PLT 215 06/14/2014   GLUCOSE 100 (H) 12/09/2015   CHOL 149 12/09/2015   TRIG 63.0 12/09/2015   HDL 50.30 12/09/2015   LDLCALC 86 12/09/2015   ALT 29 09/22/2012   AST 22 09/22/2012   NA 140 12/09/2015   K 4.4 12/09/2015   CL 108 12/09/2015   CREATININE 1.38 12/09/2015   BUN 20 12/09/2015   CO2 28 12/09/2015   TSH 1.84 12/27/2011   PSA 1.34 08/13/2009   HGBA1C 5.7 12/09/2015    Lab Results  Component Value Date   TSH 1.84 12/27/2011   Lab Results  Component Value Date   WBC 6.8 06/14/2014   HGB 13.7 06/14/2014   HCT 40.1 06/14/2014   MCV 91.6 06/14/2014   PLT 215 06/14/2014   Lab Results  Component Value Date   NA 140 12/09/2015   K 4.4 12/09/2015   CO2 28 12/09/2015   GLUCOSE 100 (H) 12/09/2015   BUN 20 12/09/2015   CREATININE 1.38 12/09/2015   BILITOT 0.3 09/22/2012   ALKPHOS 80 09/22/2012   AST 22 09/22/2012   ALT 29 09/22/2012   PROT 6.4 09/22/2012   ALBUMIN 3.6 09/22/2012   CALCIUM 9.1 12/09/2015   GFR 64.61 12/09/2015   Lab Results  Component Value Date   CHOL 149 12/09/2015   Lab Results  Component Value  Date   HDL 50.30 12/09/2015   Lab Results  Component Value Date   LDLCALC 86 12/09/2015   Lab Results  Component Value Date   TRIG 63.0 12/09/2015   Lab Results  Component Value Date   CHOLHDL 3 12/09/2015   Lab Results  Component Value Date   HGBA1C 5.7 12/09/2015         Assessment & Plan:   Problem List Items Addressed This Visit    None    Visit Diagnoses    Back pain with radiation    -  Primary   Relevant Medications   traMADol (ULTRAM) 50 MG tablet   tiZANidine (ZANAFLEX) 4 MG tablet   predniSONE (DELTASONE) 10 MG tablet      I have discontinued Mr. Cloe benzonatate, predniSONE, and azithromycin. I am also having him start on traMADol, tiZANidine, and predniSONE. Additionally, I am having him maintain his aspirin, losartan, metoprolol succinate, spironolactone, sertraline, albuterol, albuterol, Fluticasone-Salmeterol, amLODipine, and montelukast.  Meds ordered this encounter  Medications  . traMADol (ULTRAM) 50 MG tablet    Sig: Take 1 tablet (50 mg total) by mouth every 8 (eight) hours as needed.    Dispense:  30 tablet    Refill:  0  . tiZANidine (ZANAFLEX) 4 MG tablet    Sig: Take 1 tablet (4 mg total) by mouth every 6 (six) hours as needed for muscle spasms.    Dispense:  30 tablet    Refill:  0  . predniSONE (DELTASONE) 10 MG tablet    Sig: TAKE 3 TABLETS PO QD FOR 3 DAYS THEN TAKE 2 TABLETS PO QD FOR 3 DAYS THEN TAKE 1 TABLET PO QD FOR 3 DAYS THEN TAKE 1/2  TAB PO QD FOR 3 DAYS    Dispense:  20 tablet    Refill:  0    CMA served as scribe during this visit. History, Physical and Plan performed by medical provider. Documentation and orders reviewed and attested to.  Donato Schultz, DO

## 2016-06-22 ENCOUNTER — Telehealth: Payer: Self-pay | Admitting: *Deleted

## 2016-06-22 DIAGNOSIS — I10 Essential (primary) hypertension: Secondary | ICD-10-CM

## 2016-06-22 NOTE — Telephone Encounter (Signed)
Received fax from [facility] requesting 90 day supply of Losartan 50mg . Pt last seen by PCP 11/2015 and has had 2 acute visits with other Providers this year. Please advise request?

## 2016-06-23 MED ORDER — LOSARTAN POTASSIUM 50 MG PO TABS: 50.0000 mg | ORAL_TABLET | Freq: Two times a day (BID) | ORAL | 0 refills | Status: DC

## 2016-06-23 NOTE — Telephone Encounter (Signed)
Ok to send refill but pt needs office visit please.

## 2016-06-23 NOTE — Telephone Encounter (Signed)
Patient informed and will call back to schedule follow up, patient states he has to check he's agenda.

## 2016-06-23 NOTE — Telephone Encounter (Signed)
90 day supply sent to pharmacy. Please call pt to schedule routine follow up with [name] soon as additional refills cannot be given until pt is seen in the office.  THanks!

## 2016-07-01 ENCOUNTER — Other Ambulatory Visit: Payer: Self-pay | Admitting: Family

## 2016-07-28 NOTE — Progress Notes (Signed)
HPI The patient presents for followup of known cardiomyopathy. Since I last saw him he has done OK.  The patient denies any new symptoms such as chest discomfort, neck or arm discomfort. There has been no new shortness of breath, PND or orthopnea. There have been no reported palpitations, presyncope or syncope.  He did have increased SOB at the end of last year but he was in the "donut hole" and he was not taking his bronchodilators.  He is better now.  His weight is up about 10 lbs since I last saw him.    The patient denies any new symptoms such as chest discomfort, neck or arm discomfort. There has been no new PND or orthopnea. There have been no reported palpitations, presyncope or syncope.  Allergies  Allergen Reactions  . Ace Inhibitors     REACTION: cough  . Isosorb Dinitrate-Hydralazine     REACTION: dizziness/hypotensive    Current Outpatient Prescriptions  Medication Sig Dispense Refill  . albuterol (PROVENTIL HFA;VENTOLIN HFA) 108 (90 Base) MCG/ACT inhaler Inhale 2 puffs into the lungs every 6 (six) hours as needed for wheezing or shortness of breath. 1 Inhaler 5  . albuterol (PROVENTIL) (2.5 MG/3ML) 0.083% nebulizer solution Take 3 mLs (2.5 mg total) by nebulization every 6 (six) hours as needed for wheezing. 75 mL 5  . amLODipine (NORVASC) 10 MG tablet TAKE ONE TABLET BY MOUTH ONCE DAILY 90 tablet 0  . aspirin 81 MG tablet Take 81 mg by mouth daily.      . Fluticasone-Salmeterol (ADVAIR DISKUS) 500-50 MCG/DOSE AEPB INHALE ONE PUFF INTO LUNGS BY MOUTH TWICE DAILY 14 each 0  . losartan (COZAAR) 50 MG tablet Take 1 tablet (50 mg total) by mouth 2 (two) times daily. 180 tablet 0  . metoprolol succinate (TOPROL-XL) 25 MG 24 hr tablet Take 1 tablet (25 mg total) by mouth daily. 90 tablet 1  . montelukast (SINGULAIR) 10 MG tablet TAKE ONE TABLET BY MOUTH ONCE DAILY AT BEDTIME 90 tablet 0  . predniSONE (DELTASONE) 10 MG tablet TAKE 3 TABLETS PO QD FOR 3 DAYS THEN TAKE 2 TABLETS PO QD  FOR 3 DAYS THEN TAKE 1 TABLET PO QD FOR 3 DAYS THEN TAKE 1/2 TAB PO QD FOR 3 DAYS 20 tablet 0  . sertraline (ZOLOFT) 50 MG tablet TAKE ONE TABLET BY MOUTH ONCE DAILY 90 tablet 0  . spironolactone (ALDACTONE) 25 MG tablet Take 1 tablet (25 mg total) by mouth daily. 90 tablet 1  . tiZANidine (ZANAFLEX) 4 MG tablet Take 1 tablet (4 mg total) by mouth every 6 (six) hours as needed for muscle spasms. 30 tablet 0  . traMADol (ULTRAM) 50 MG tablet Take 1 tablet (50 mg total) by mouth every 8 (eight) hours as needed. 30 tablet 0   No current facility-administered medications for this visit.     Past Medical History:  Diagnosis Date  . Asthma    Hx of childhood asthma, disappeared for a while, then resurfaced 6-7 years ago.   . Cardiomyopathy    with a negative cardiac catheterization in the past. (EF appriximately 40-45%)   . HTN (hypertension)    x 30 years  . Obesity, unspecified   . Sleep apnea    CPAP  . 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 None      ROS:  As stated in the HPI and negative for all other systems. PHYSICAL  EXAM BP (!) 148/88   Pulse 69   Ht 6' (1.829 m)   Wt 271 lb 6.4 oz (123.1 kg)   SpO2 94%   BMI 36.81 kg/m   GENERAL:  Well appearing NECK:  No jugular venous distention, waveform within normal limits, carotid upstroke brisk and symmetric, no bruits, no thyromegaly LUNGS:  Clear to auscultation bilaterally BACK:  No CVA tenderness CHEST:  Unremarkable HEART:  PMI not displaced or sustained,S1 and S2 within normal limits, no S3, no S4, no clicks, no rubs, no murmurs ABD:  Flat, positive bowel sounds normal in frequency in pitch, no bruits, no rebound, no guarding, no midline pulsatile mass, no hepatomegaly, no splenomegaly EXT:  2 plus pulses throughout, trace edema, no cyanosis no clubbing   EKG:   Sinus rhythm, rate 64, axis within normal limits, intervals within normal limits,  no acute ST-T wave changes.  07/30/2016  ASSESSMENT AND PLAN  CARDIOMYOPATHY -  He did have some increased SOB.  He needs an echocardiogram.  I suspect that this is related to weight and deconditioning.    HYPERTENSION -  BP is elevated.  He is going to keep a BP diary and concentrate on weight loss.  If remains elevated he will need to have a change in his BP.   OBSTRUCTIVE SLEEP APNEA -   He is wearing the CPAP every night.  He needs to concentrate on weight loss  OBESITY, UNSPECIFIED -  We talked about this again todayl

## 2016-07-29 ENCOUNTER — Ambulatory Visit (INDEPENDENT_AMBULATORY_CARE_PROVIDER_SITE_OTHER): Payer: Medicare Other | Admitting: Cardiology

## 2016-07-29 ENCOUNTER — Encounter: Payer: Self-pay | Admitting: Cardiology

## 2016-07-29 VITALS — BP 148/88 | HR 69 | Ht 72.0 in | Wt 271.4 lb

## 2016-07-29 DIAGNOSIS — I428 Other cardiomyopathies: Secondary | ICD-10-CM | POA: Diagnosis not present

## 2016-07-29 DIAGNOSIS — I10 Essential (primary) hypertension: Secondary | ICD-10-CM

## 2016-07-29 NOTE — Patient Instructions (Signed)

## 2016-07-30 ENCOUNTER — Encounter: Payer: Self-pay | Admitting: Cardiology

## 2016-08-17 ENCOUNTER — Ambulatory Visit (HOSPITAL_COMMUNITY): Payer: Medicare Other | Attending: Cardiovascular Disease

## 2016-08-17 ENCOUNTER — Other Ambulatory Visit: Payer: Self-pay

## 2016-08-17 DIAGNOSIS — Z6836 Body mass index (BMI) 36.0-36.9, adult: Secondary | ICD-10-CM | POA: Diagnosis not present

## 2016-08-17 DIAGNOSIS — I517 Cardiomegaly: Secondary | ICD-10-CM | POA: Insufficient documentation

## 2016-08-17 DIAGNOSIS — I428 Other cardiomyopathies: Secondary | ICD-10-CM

## 2016-08-17 DIAGNOSIS — G4730 Sleep apnea, unspecified: Secondary | ICD-10-CM | POA: Diagnosis not present

## 2016-08-17 DIAGNOSIS — I429 Cardiomyopathy, unspecified: Secondary | ICD-10-CM | POA: Diagnosis present

## 2016-08-17 DIAGNOSIS — E669 Obesity, unspecified: Secondary | ICD-10-CM | POA: Diagnosis not present

## 2016-08-17 DIAGNOSIS — J45909 Unspecified asthma, uncomplicated: Secondary | ICD-10-CM | POA: Diagnosis not present

## 2016-08-23 ENCOUNTER — Ambulatory Visit (INDEPENDENT_AMBULATORY_CARE_PROVIDER_SITE_OTHER): Payer: Medicare Other | Admitting: Family

## 2016-08-23 ENCOUNTER — Encounter: Payer: Self-pay | Admitting: Family

## 2016-08-23 VITALS — BP 135/77 | HR 61 | Temp 98.3°F | Resp 18 | Ht 72.0 in | Wt 268.4 lb

## 2016-08-23 DIAGNOSIS — R739 Hyperglycemia, unspecified: Secondary | ICD-10-CM

## 2016-08-23 DIAGNOSIS — M25512 Pain in left shoulder: Secondary | ICD-10-CM | POA: Diagnosis not present

## 2016-08-23 DIAGNOSIS — G4733 Obstructive sleep apnea (adult) (pediatric): Secondary | ICD-10-CM | POA: Diagnosis not present

## 2016-08-23 DIAGNOSIS — J45909 Unspecified asthma, uncomplicated: Secondary | ICD-10-CM

## 2016-08-23 DIAGNOSIS — M25511 Pain in right shoulder: Secondary | ICD-10-CM | POA: Diagnosis not present

## 2016-08-23 DIAGNOSIS — I10 Essential (primary) hypertension: Secondary | ICD-10-CM | POA: Diagnosis not present

## 2016-08-23 LAB — BASIC METABOLIC PANEL
BUN: 25 mg/dL — ABNORMAL HIGH (ref 6–23)
CALCIUM: 9.8 mg/dL (ref 8.4–10.5)
CHLORIDE: 107 meq/L (ref 96–112)
CO2: 22 meq/L (ref 19–32)
CREATININE: 1.68 mg/dL — AB (ref 0.40–1.50)
GFR: 51.39 mL/min — ABNORMAL LOW (ref >60.00)
Glucose, Bld: 102 mg/dL — ABNORMAL HIGH (ref 70–99)
Potassium: 4.4 mEq/L (ref 3.5–5.1)
Sodium: 138 mEq/L (ref 135–145)

## 2016-08-23 LAB — HEMOGLOBIN A1C: HEMOGLOBIN A1C: 6.2 % (ref 4.6–6.5)

## 2016-08-23 MED ORDER — FLUTICASONE-SALMETEROL 500-50 MCG/DOSE IN AEPB: INHALATION_SPRAY | RESPIRATORY_TRACT | 5 refills | Status: DC

## 2016-08-23 MED ORDER — SERTRALINE HCL 50 MG PO TABS: 50.0000 mg | ORAL_TABLET | Freq: Every day | ORAL | 1 refills | Status: DC

## 2016-08-23 MED ORDER — METOPROLOL SUCCINATE ER 25 MG PO TB24: 25.0000 mg | ORAL_TABLET | Freq: Every day | ORAL | 1 refills | Status: DC

## 2016-08-23 MED ORDER — AMLODIPINE BESYLATE 10 MG PO TABS: 10.0000 mg | ORAL_TABLET | Freq: Every day | ORAL | 1 refills | Status: DC

## 2016-08-23 MED ORDER — MONTELUKAST SODIUM 10 MG PO TABS: 10.0000 mg | ORAL_TABLET | Freq: Every day | ORAL | 1 refills | Status: DC

## 2016-08-23 MED ORDER — SPIRONOLACTONE 25 MG PO TABS: 25.0000 mg | ORAL_TABLET | Freq: Every day | ORAL | 1 refills | Status: DC

## 2016-08-23 NOTE — Patient Instructions (Signed)
Please complete lab work prior to leaving. You may add tylenol 1000mg  twice daily for arthritis pain.

## 2016-08-23 NOTE — Progress Notes (Signed)
Subjective:    Patient ID: Brandon [name], male    DOB: [birth year], 76 y.o.   MRN: [id number]  HPI   [name] is a 76 yr old male who presents today for follow up.  1) HTN- maintained on losartan, aldactone, amlodipine. Denies swelling  BP Readings from Last 3 Encounters:  08/23/16 135/77  07/29/16 (!) 148/88  06/18/16 132/70   2) asthma-reports symptoms are improved now that he is back on advair.    3) Depression- mainatined on zoloft. Reports that his mood is good.   4) Bilateral shoulder pain- reports pain in both shoulders L>R.  Notes some neck pain.   5) OSA- continues cpap.  Feels rested in the AM. Good compliance.    Review of Systems See HPI  Past Medical History:  Diagnosis Date  . Asthma    Hx of childhood asthma, disappeared for a while, then resurfaced 6-7 years ago.   . Cardiomyopathy    with a negative cardiac catheterization in the past. (EF appriximately 40-45%)   . HTN (hypertension)    x 30 years  . Obesity, unspecified   . Sleep apnea    CPAP  . 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 Marital status: Married    Spouse name: [name]  . Number of children: 3  . Years of education: N/A   Occupational History  . retired    Social History Main Topics  . Smoking status: Former Games developer  . Smokeless tobacco: Never Used     Comment: quit smoling in 1990. started when 18, 1 ppd  . Alcohol use Yes     Comment: occasionaly   . Drug use: Unknown  . Sexual activity: Not on file   Other Topics Concern  . Not on file   Social History Narrative   Grew up in [place], attended [employer]. First wife died of breast ca in 07/21/1997. 3 children. Remarried- 8 years. 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    Pt signed designated party release granting access to [facility] to his wife [name]. Detailed message may be left on home or cell phone. Roselle Locus August 13, 2009 11:34  am.     Past Surgical History:  Procedure Laterality Date  . None      Family History  Problem Relation Age of Onset  . Cancer Father        multiple melanoma  . Lupus Sister   . Asthma Daughter     Allergies  Allergen Reactions  . Ace Inhibitors     REACTION: cough  . Isosorb Dinitrate-Hydralazine     REACTION: dizziness/hypotensive    Current Outpatient Prescriptions on File Prior to Visit  Medication Sig Dispense Refill  . albuterol (PROVENTIL HFA;VENTOLIN HFA) 108 (90 Base) MCG/ACT inhaler Inhale 2 puffs into the lungs every 6 (six) hours as needed for wheezing or shortness of breath. 1 Inhaler 5  . albuterol (PROVENTIL) (2.5 MG/3ML) 0.083% nebulizer solution Take 3 mLs (2.5 mg total) by nebulization every 6 (six) hours as needed for wheezing. 75 mL 5  . amLODipine (NORVASC) 10 MG tablet TAKE ONE TABLET BY MOUTH ONCE DAILY 90 tablet 0  . aspirin 81 MG tablet Take 81 mg by mouth daily.      . Fluticasone-Salmeterol (ADVAIR DISKUS) 500-50 MCG/DOSE AEPB INHALE ONE PUFF INTO LUNGS BY MOUTH TWICE DAILY 14 each 0  . losartan (  COZAAR) 50 MG tablet Take 1 tablet (50 mg total) by mouth 2 (two) times daily. 180 tablet 0  . metoprolol succinate (TOPROL-XL) 25 MG 24 hr tablet Take 1 tablet (25 mg total) by mouth daily. 90 tablet 1  . montelukast (SINGULAIR) 10 MG tablet TAKE ONE TABLET BY MOUTH ONCE DAILY AT BEDTIME 90 tablet 0  . sertraline (ZOLOFT) 50 MG tablet TAKE ONE TABLET BY MOUTH ONCE DAILY 90 tablet 0  . spironolactone (ALDACTONE) 25 MG tablet Take 1 tablet (25 mg total) by mouth daily. 90 tablet 1   No current facility-administered medications on file prior to visit.     BP 135/77 (BP Location: Right Arm, Cuff Size: Large)   Pulse 61   Temp 98.3 F (36.8 C) (Oral)   Resp 18   Ht 6' (1.829 m)   Wt 268 lb 6.4 oz (121.7 kg)   SpO2 98%   BMI 36.40 kg/m       Objective:   Physical Exam  Constitutional: He is oriented to person, place, and time. He appears  well-developed and well-nourished. No distress.  HENT:  Head: Normocephalic and atraumatic.  Cardiovascular: Normal rate and regular rhythm.   No murmur heard. Pulmonary/Chest: Effort normal and breath sounds normal. No respiratory distress. He has no wheezes. He has no rales.  Musculoskeletal: He exhibits no edema.  Full rom both shoulders- no swelling, no tenderness  Neurological: He is alert and oriented to person, place, and time.  Skin: Skin is warm and dry.  Psychiatric: He has a normal mood and affect. His behavior is normal. Thought content normal.          Assessment & Plan:  Hyperglycemia- check A1C.

## 2016-08-24 ENCOUNTER — Encounter: Payer: Self-pay | Admitting: Family

## 2016-08-24 DIAGNOSIS — M25512 Pain in left shoulder: Secondary | ICD-10-CM

## 2016-08-24 DIAGNOSIS — M25511 Pain in right shoulder: Secondary | ICD-10-CM | POA: Insufficient documentation

## 2016-08-24 NOTE — Assessment & Plan Note (Signed)
Stable, continue CPAP

## 2016-08-24 NOTE — Assessment & Plan Note (Signed)
BP stable, continue current meds. 

## 2016-08-24 NOTE — Assessment & Plan Note (Signed)
Trial of tylenol bid.  If symptoms worsen, consider referral to ortho.

## 2016-08-24 NOTE — Assessment & Plan Note (Signed)
Stable on advair/singulair. Continues to use albuterol prn. Continue same.

## 2016-09-28 ENCOUNTER — Other Ambulatory Visit: Payer: Self-pay | Admitting: Family

## 2016-10-12 ENCOUNTER — Other Ambulatory Visit: Payer: Self-pay | Admitting: *Deleted

## 2016-10-12 DIAGNOSIS — I10 Essential (primary) hypertension: Secondary | ICD-10-CM

## 2016-10-12 MED ORDER — LOSARTAN POTASSIUM 50 MG PO TABS
50.0000 mg | ORAL_TABLET | Freq: Two times a day (BID) | ORAL | 0 refills | Status: DC
Start: 2016-10-12 — End: 2017-02-07

## 2016-10-12 NOTE — Progress Notes (Signed)
Faxed refill request received from [facility] for Losartan 50mg  tab Last filled by MD on 06/24/15, #180x0 Last AEX - 08/23/16, renal function baseline, stable Refill sent per [facility] refill protocol/SLS 07/31

## 2017-01-25 ENCOUNTER — Telehealth: Payer: Self-pay | Admitting: *Deleted

## 2017-01-25 NOTE — Telephone Encounter (Signed)
Received fax from [facility] stating pt's formulary has changed and now prefers Albuterol HFA instead of Proventil. Rx sent to Albuterol. Pt last seen by PCP in June and was advised f/u in 3 months for CPE. Pt is past due. Mailed letter.

## 2017-02-07 ENCOUNTER — Telehealth: Payer: Self-pay | Admitting: Family

## 2017-02-07 DIAGNOSIS — I10 Essential (primary) hypertension: Secondary | ICD-10-CM

## 2017-02-08 NOTE — Telephone Encounter (Signed)
30 day supply of losartan sent to pharmacy. Pt was due for physical with [name] in September.  Please call pt to schedule appt before he needs further refills. Thanks!

## 2017-02-09 ENCOUNTER — Encounter: Payer: Self-pay | Admitting: *Deleted

## 2017-02-09 NOTE — Progress Notes (Signed)
Letter mailed to pt to schedule appt soon.

## 2017-02-09 NOTE — Telephone Encounter (Signed)
Left voicemail for pt to schedule an appt.

## 2017-02-09 NOTE — Telephone Encounter (Signed)
Letter mailed to pt to schedule appt soon. 

## 2017-03-09 ENCOUNTER — Other Ambulatory Visit: Payer: Self-pay | Admitting: Family

## 2017-03-09 DIAGNOSIS — I10 Essential (primary) hypertension: Secondary | ICD-10-CM

## 2017-04-20 ENCOUNTER — Telehealth: Payer: Self-pay | Admitting: Family

## 2017-04-20 NOTE — Telephone Encounter (Signed)
Copied from CRM [id number]. Topic: Quick Communication - Rx Refill/Question >> Apr 20, 2017  1:36 PM Elliot Gault wrote:  Relation to pt: self Call back number: [phone] Pharmacy: [facility] [address] [place], [place] - [address] [phone] (Phone) [phone] (Fax)    Reason for call:  Patient scheduled follow up appointment for 04/22/17 requesting montelukast (SINGULAIR) 10 MG tablet to hold him over until Friday appointment stating he's completely out. Informed patient in the future please allow 48 to 72 hour turn around time and contact pharmacy, patient voice understanding, please advise

## 2017-04-22 ENCOUNTER — Encounter: Payer: Self-pay | Admitting: Family

## 2017-04-22 ENCOUNTER — Ambulatory Visit (INDEPENDENT_AMBULATORY_CARE_PROVIDER_SITE_OTHER): Payer: Medicare Other | Admitting: Family

## 2017-04-22 VITALS — BP 146/80 | HR 65 | Temp 98.5°F | Resp 16 | Ht 72.0 in | Wt 259.4 lb

## 2017-04-22 DIAGNOSIS — F32A Depression, unspecified: Secondary | ICD-10-CM

## 2017-04-22 DIAGNOSIS — Z23 Encounter for immunization: Secondary | ICD-10-CM | POA: Diagnosis not present

## 2017-04-22 DIAGNOSIS — J45909 Unspecified asthma, uncomplicated: Secondary | ICD-10-CM | POA: Diagnosis not present

## 2017-04-22 DIAGNOSIS — F329 Major depressive disorder, single episode, unspecified: Secondary | ICD-10-CM

## 2017-04-22 DIAGNOSIS — I10 Essential (primary) hypertension: Secondary | ICD-10-CM | POA: Diagnosis not present

## 2017-04-22 DIAGNOSIS — G4733 Obstructive sleep apnea (adult) (pediatric): Secondary | ICD-10-CM | POA: Diagnosis not present

## 2017-04-22 DIAGNOSIS — I429 Cardiomyopathy, unspecified: Secondary | ICD-10-CM

## 2017-04-22 LAB — COMPREHENSIVE METABOLIC PANEL
AG RATIO: 1.4 (calc) (ref 1.0–2.5)
ALT: 24 U/L (ref 9–46)
AST: 19 U/L (ref 10–35)
Albumin: 3.9 g/dL (ref 3.6–5.1)
Alkaline phosphatase (APISO): 85 U/L (ref 40–115)
BILIRUBIN TOTAL: 0.3 mg/dL (ref 0.2–1.2)
BUN/Creatinine Ratio: 16 (calc) (ref 6–22)
BUN: 24 mg/dL (ref 7–25)
CALCIUM: 9.5 mg/dL (ref 8.6–10.3)
CHLORIDE: 108 mmol/L (ref 98–110)
CO2: 22 mmol/L (ref 20–32)
Creat: 1.51 mg/dL — ABNORMAL HIGH (ref 0.70–1.18)
Globulin: 2.8 g/dL (calc) (ref 1.9–3.7)
Glucose, Bld: 112 mg/dL — ABNORMAL HIGH (ref 65–99)
Potassium: 4.5 mmol/L (ref 3.5–5.3)
SODIUM: 137 mmol/L (ref 135–146)
Total Protein: 6.7 g/dL (ref 6.1–8.1)

## 2017-04-22 MED ORDER — FLUTICASONE-SALMETEROL 500-50 MCG/DOSE IN AEPB: 1.0000 | INHALATION_SPRAY | Freq: Two times a day (BID) | RESPIRATORY_TRACT | 5 refills | Status: DC

## 2017-04-22 MED ORDER — SERTRALINE HCL 50 MG PO TABS: 50.0000 mg | ORAL_TABLET | Freq: Every day | ORAL | 15 refills | Status: DC

## 2017-04-22 MED ORDER — LOSARTAN POTASSIUM 50 MG PO TABS: 50.0000 mg | ORAL_TABLET | Freq: Two times a day (BID) | ORAL | 5 refills | Status: DC

## 2017-04-22 MED ORDER — AMLODIPINE BESYLATE 10 MG PO TABS: 10.0000 mg | ORAL_TABLET | Freq: Every day | ORAL | 5 refills | Status: DC

## 2017-04-22 MED ORDER — SPIRONOLACTONE 25 MG PO TABS: 25.0000 mg | ORAL_TABLET | Freq: Every day | ORAL | 5 refills | Status: DC

## 2017-04-22 MED ORDER — MONTELUKAST SODIUM 10 MG PO TABS: 10.0000 mg | ORAL_TABLET | Freq: Every day | ORAL | 1 refills | Status: DC

## 2017-04-22 MED ORDER — METOPROLOL SUCCINATE ER 25 MG PO TB24: 25.0000 mg | ORAL_TABLET | Freq: Every day | ORAL | 5 refills | Status: DC

## 2017-04-22 MED ORDER — ALBUTEROL SULFATE HFA 108 (90 BASE) MCG/ACT IN AERS: 2.0000 | INHALATION_SPRAY | Freq: Four times a day (QID) | RESPIRATORY_TRACT | 5 refills | Status: DC | PRN

## 2017-04-22 NOTE — Progress Notes (Signed)
Subjective:    Patient ID: Brandon [name], male    DOB: August 17, 1940, 77 y.o.   MRN: [id number]  HPI  Patient is a 77 yr old male who presents today for follow up:  1) HTN-current blood pressure medication includes losartan 50 mg twice daily, amlodipine 10 mg once daily, and Toprol-XL 25 mg once daily.  He is also maintained on Aldactone 25 mg once daily. BP Readings from Last 3 Encounters:  04/22/17 (!) 146/80  08/23/16 135/77  07/29/16 (!) 148/88   2) Asthma-he is maintained on Advair 500-50 as well as as needed albuterol he has both a nebulizer and an metered-dose inhaler. Reports that he has noted increased symptoms since he has been out of singulair.    3) Depression-he is maintained on Zoloft 50 mg once daily. Reports mood is good.   4) OSA- maintained on cpap.   5) Hx of cardiomyopathy-he is followed by cardiology.  Review of Systems See HPI  Past Medical History:  Diagnosis Date  . Asthma    Hx of childhood asthma, disappeared for a while, then resurfaced 6-7 years ago.   . Cardiomyopathy    with a negative cardiac catheterization in the past. (EF appriximately 40-45%)   . HTN (hypertension)    x 30 years  . Obesity, unspecified   . Sleep apnea    CPAP  . 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 Marital status: Married    Spouse name: [name]  . Number of children: 3  . Years of education: Not on file  . Highest education level: Not on file  Social Needs  . Financial resource strain: Not on file  . Food insecurity - worry: Not on file  . Food insecurity - inability: Not on file  . Transportation needs - medical: Not on file  . Transportation needs - non-medical: Not on file  Occupational History  . Occupation: retired  Tobacco Use  . Smoking status: Former Games developer  . Smokeless tobacco: Never Used  . Tobacco comment: quit smoling in 07/05/88. started when 18, 1 ppd  Substance and Sexual  Activity  . Alcohol use: Yes    Comment: occasionaly   . Drug use: Not on file  . Sexual activity: Not on file  Other Topics Concern  . Not on file  Social History Narrative   Grew up in [place], attended [employer]. First wife died of breast ca in 07/05/1997. 3 children. Remarried- 8 years. 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    Pt signed designated party release granting access to [facility] to his wife [name]. Detailed message may be left on home or cell phone. Roselle Locus August 13, 2009 11:34 am.     Past Surgical History:  Procedure Laterality Date  . None      Family History  Problem Relation Age of Onset  . Cancer Father        multiple melanoma  . Lupus Sister   . Asthma Daughter     Allergies  Allergen Reactions  . Ace Inhibitors     REACTION: cough  . Isosorb Dinitrate-Hydralazine     REACTION: dizziness/hypotensive    Current Outpatient Medications on File Prior to Visit  Medication Sig Dispense Refill  . ADVAIR DISKUS 500-50 MCG/DOSE AEPB INHALE ONE DOSE BY MOUTH TWICE DAILY 180 each 1  . albuterol (PROVENTIL HFA;VENTOLIN HFA) 108 (90 Base) MCG/ACT  inhaler Inhale 2 puffs into the lungs every 6 (six) hours as needed for wheezing or shortness of breath. 1 Inhaler 5  . albuterol (PROVENTIL) (2.5 MG/3ML) 0.083% nebulizer solution Take 3 mLs (2.5 mg total) by nebulization every 6 (six) hours as needed for wheezing. 75 mL 5  . amLODipine (NORVASC) 10 MG tablet Take 1 tablet (10 mg total) by mouth daily. 90 tablet 1  . aspirin 81 MG tablet Take 81 mg by mouth daily.      Marland Kitchen losartan (COZAAR) 50 MG tablet Take 1 tablet (50 mg total) by mouth 2 (two) times daily. 60 tablet 0  . metoprolol succinate (TOPROL-XL) 25 MG 24 hr tablet Take 1 tablet (25 mg total) by mouth daily. 90 tablet 1  . montelukast (SINGULAIR) 10 MG tablet Take 1 tablet (10 mg total) by mouth daily with breakfast. 90 tablet 1  . sertraline (ZOLOFT) 50 MG tablet Take 1 tablet (50  mg total) by mouth daily. 90 tablet 1  . spironolactone (ALDACTONE) 25 MG tablet Take 1 tablet (25 mg total) by mouth daily. 90 tablet 1   No current facility-administered medications on file prior to visit.     BP (!) 146/80 (BP Location: Right Arm, Patient Position: Sitting, Cuff Size: Large)   Pulse 65   Temp 98.5 F (36.9 C) (Oral)   Resp 16   Ht 6' (1.829 m)   Wt 259 lb 6.4 oz (117.7 kg)   SpO2 96%   BMI 35.18 kg/m       Objective:   Physical Exam  Constitutional: He is oriented to person, place, and time. He appears well-developed and well-nourished. No distress.  HENT:  Head: Normocephalic and atraumatic.  Cardiovascular: Normal rate and regular rhythm.  No murmur heard. Pulmonary/Chest: Effort normal and breath sounds normal. No respiratory distress. He has no wheezes. He has no rales.  Musculoskeletal: He exhibits no edema.  Trace bilateral lower extremity edema is noted  Neurological: He is alert and oriented to person, place, and time.  Skin: Skin is warm and dry.  Psychiatric: He has a normal mood and affect. His behavior is normal. Thought content normal.          Assessment & Plan:  Hypertension-BP is up slightly for him today.  He notes that he had run out of some of his medication.  Refills will be sent today.  Obstructive sleep apnea- reports that he is mostly compliant with this.  I will arrange an appointment with pulmonology for management of his CPAP.  Cardiomyopathy-appears clinically compensated.  He is followed by cardiology.  Defer management to cardiology.  Asthma-uncontrolled recently since he has been out of his Singulair.  Restart Singulair he will receive a flu shot today.  Depression-reports mood is stable on current dose of Zoloft-continue same.

## 2017-04-22 NOTE — Telephone Encounter (Signed)
Will send at [facility] today.

## 2017-04-22 NOTE — Patient Instructions (Signed)
Please complete lab work prior to leaving. Continue CPAP, restart singulair.  You will be contacted about scheduling an appointment with Dr. Vassie Loll.

## 2017-04-22 NOTE — Addendum Note (Signed)
Addended by: Mervin Kung A on: 04/22/2017 06:32 PM   Modules accepted: Orders

## 2017-04-24 ENCOUNTER — Encounter: Payer: Self-pay | Admitting: Family

## 2017-04-25 ENCOUNTER — Other Ambulatory Visit: Payer: Self-pay

## 2017-04-25 ENCOUNTER — Emergency Department (HOSPITAL_BASED_OUTPATIENT_CLINIC_OR_DEPARTMENT_OTHER)
Admission: EM | Admit: 2017-04-25 | Discharge: 2017-04-25 | Disposition: A | Discharge status: Home / Self Care | Payer: Medicare Other | Attending: Emergency Medicine | Admitting: Emergency Medicine

## 2017-04-25 ENCOUNTER — Encounter (HOSPITAL_BASED_OUTPATIENT_CLINIC_OR_DEPARTMENT_OTHER): Payer: Self-pay | Admitting: Emergency Medicine

## 2017-04-25 DIAGNOSIS — Z7982 Long term (current) use of aspirin: Secondary | ICD-10-CM | POA: Diagnosis not present

## 2017-04-25 DIAGNOSIS — K0889 Other specified disorders of teeth and supporting structures: Secondary | ICD-10-CM | POA: Diagnosis not present

## 2017-04-25 DIAGNOSIS — I10 Essential (primary) hypertension: Secondary | ICD-10-CM | POA: Insufficient documentation

## 2017-04-25 DIAGNOSIS — Z87891 Personal history of nicotine dependence: Secondary | ICD-10-CM | POA: Diagnosis not present

## 2017-04-25 DIAGNOSIS — J45909 Unspecified asthma, uncomplicated: Secondary | ICD-10-CM | POA: Diagnosis not present

## 2017-04-25 DIAGNOSIS — Z79899 Other long term (current) drug therapy: Secondary | ICD-10-CM | POA: Insufficient documentation

## 2017-04-25 MED ORDER — BENZOCAINE 10 % MT GEL: 1.0000 "application " | OROMUCOSAL | 0 refills | Status: DC | PRN

## 2017-04-25 MED ORDER — PENICILLIN V POTASSIUM 500 MG PO TABS: 500.0000 mg | ORAL_TABLET | Freq: Four times a day (QID) | ORAL | 0 refills | Status: AC

## 2017-04-25 NOTE — ED Notes (Signed)
ED Provider at bedside. 

## 2017-04-25 NOTE — ED Provider Notes (Signed)
[facility] EMERGENCY DEPARTMENT Provider Note   CSN: [id number] Arrival date & time: 04/25/17  1834     History   Chief Complaint Chief Complaint  Patient presents with  . Dental Pain    HPI  Brandon [name] is a 77 y.o. Male with history of cardiomyopathy, hypertension, sleep apnea, asthma and obesity, who presents to the ED for evaluation of 2 days of dental pain.  Patient reports he has a tooth on his left lower jaw that is "hollowed out" and he started having pain at and around this tooth yesterday.  Patient reports pain is constant, made worse with eating and cold liquids.  Has tried Tylenol without relief, no other meds prior to arrival.  No associated facial swelling, no swelling or pain sublingually, no difficulty breathing.  Patient denies any fevers or chills, no nausea or vomiting.  Patient has a Education officer, community but has not seen him recently.      Past Medical History:  Diagnosis Date  . Asthma    Hx of childhood asthma, disappeared for a while, then resurfaced 6-7 years ago.   . Cardiomyopathy    with a negative cardiac catheterization in the past. (EF appriximately 40-45%)   . HTN (hypertension)    x 30 years  . Obesity, unspecified   . Sleep apnea    CPAP  . 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 Bilateral shoulder pain 08/24/2016  . Depression 12/09/2015  . Pain and swelling of knee 06/14/2014  . Edema 12/06/2011  . Asthma 08/13/2009  . Hyperglycemia 08/13/2009  . Obstructive sleep apnea 01/23/2009  . 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 OBESITY, UNSPECIFIED 08/26/2008  . 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 Essential hypertension 06/28/2008  . 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 None         Home Medications    Prior to Admission medications   Medication Sig  Start Date End Date Taking? Authorizing Provider  albuterol (PROAIR HFA) 108 (90 Base) MCG/ACT inhaler Inhale 2 puffs into the lungs every 6 (six) hours as needed for wheezing or shortness of breath. 04/22/17   [name], NP  amLODipine (NORVASC) 10 MG tablet Take 1 tablet (10 mg total) by mouth daily. 04/22/17   [name], NP  aspirin 81 MG tablet Take 81 mg by mouth daily.      [provider]  Fluticasone-Salmeterol (ADVAIR DISKUS) 500-50 MCG/DOSE AEPB Inhale 1 puff into the lungs 2 (two) times daily. 04/22/17   [name], NP  losartan (COZAAR) 50 MG tablet Take 1 tablet (50 mg total) by mouth 2 (two) times daily. 04/22/17   [name], NP  metoprolol succinate (TOPROL-XL) 25 MG 24 hr tablet Take 1 tablet (25 mg total) by mouth daily. 04/22/17   [name], NP  montelukast (SINGULAIR) 10 MG tablet Take 1 tablet (10 mg total) by mouth daily with breakfast. 04/22/17   [name], NP  sertraline (ZOLOFT) 50 MG tablet Take 1 tablet (50 mg total) by mouth daily. 04/22/17   [name], NP  spironolactone (ALDACTONE) 25 MG tablet Take 1 tablet (25 mg total) by mouth daily. 04/22/17   [name], NP    Family History Family History  Problem Relation Age of Onset  . Cancer Father        multiple melanoma  .  Lupus Sister   . Asthma Daughter     Social History Social History   Tobacco Use  . Smoking status: Former Games developer  . Smokeless tobacco: Never Used  . Tobacco comment: quit smoling in 1990. started when 18, 1 ppd  Substance Use Topics  . Alcohol use: Yes    Comment: occasionaly   . Drug use: Not on file     Allergies   Ace inhibitors and Isosorb dinitrate-hydralazine   Review of Systems Review of Systems  Constitutional: Negative for chills and fever.  HENT: Positive for dental problem. Negative for drooling, facial swelling and trouble swallowing.   Respiratory: Negative for shortness of breath and stridor.        Physical Exam Updated Vital Signs BP (!) 157/99 (BP Location: Right Arm)   Pulse 72   Temp 98.8 F (37.1 C) (Oral)   Resp 16   Ht 6' (1.829 m)   Wt 116.1 kg (256 lb)   SpO2 99%   BMI 34.72 kg/m   Physical Exam  Constitutional: He appears well-developed and well-nourished. No distress.  HENT:  Head: Normocephalic and atraumatic.  Mouth/Throat: Uvula is midline and oropharynx is clear and moist. Abnormal dentition.    No upper teeth present, lower left second molar, appears decayed, no obvious abscess present, mild erythema, no swelling or sublingual tenderness, posterior oropharynx is clear and unobstructed, mucus membranes moist  Eyes: Right eye exhibits no discharge. Left eye exhibits no discharge.  Neck: Normal range of motion. Neck supple.  Pulmonary/Chest: Effort normal and breath sounds normal. No stridor. No respiratory distress. He has no wheezes. He has no rales.  Clear to auscultation, no stridor  Lymphadenopathy:    He has no cervical adenopathy.  Neurological: He is alert. Coordination normal.  Skin: Skin is warm and dry. He is not diaphoretic.  Psychiatric: He has a normal mood and affect. His behavior is normal.  Nursing note and vitals reviewed.    ED Treatments / Results  Labs (all labs ordered are listed, but only abnormal results are displayed) Labs Reviewed - No data to display  EKG  EKG Interpretation None       Radiology No results found.  Procedures Procedures (including critical care time)  Medications Ordered in ED Medications - No data to display   Initial Impression / Assessment and Plan / ED Course  I have reviewed the triage vital signs and the nursing notes.  Pertinent labs & imaging results that were available during my care of the patient were reviewed by me and considered in my medical decision making (see chart for details).  Patient with toothache.  No gross abscess.  Exam unconcerning for Ludwig's angina or spread  of infection.  Will treat with penicillin and anti-inflammatories medicine.  Urged patient to follow-up with dentist.    Patient discussed with Dr. Rush Landmark, who saw patient as well and agrees with plan.    Final Clinical Impressions(s) / ED Diagnoses   Final diagnoses:  Toothache    ED Discharge Orders        Ordered    penicillin v potassium (VEETID) 500 MG tablet  4 times daily     04/25/17 2332    benzocaine (ORAJEL) 10 % mucosal gel  As needed     04/25/17 2332       Dartha Lodge, [place] 04/25/17 2333    Tegeler, Canary Brim, MD 04/26/17 [phone]

## 2017-04-25 NOTE — Discharge Instructions (Signed)
Please take entire course of antibiotics as directed.  You will need to follow-up with your dentist for definitive management of your tooth pain.  You may use Tylenol as well as topical Orajel cream to help with pain.  Hopefully with antibiotics pain will improve.  If you have fevers or chills, significantly worsening pain, swelling in the mouth or difficulty breathing return to the ED for reevaluation.

## 2017-04-25 NOTE — ED Triage Notes (Signed)
Patient states that he has pain to the left side of his mouth and to his "teeth"

## 2017-05-23 ENCOUNTER — Other Ambulatory Visit: Payer: Self-pay

## 2017-05-23 ENCOUNTER — Emergency Department (HOSPITAL_BASED_OUTPATIENT_CLINIC_OR_DEPARTMENT_OTHER)
Admission: EM | Admit: 2017-05-23 | Discharge: 2017-05-23 | Disposition: A | Discharge status: Home / Self Care | Payer: Medicare Other | Attending: Emergency Medicine | Admitting: Emergency Medicine

## 2017-05-23 ENCOUNTER — Encounter (HOSPITAL_BASED_OUTPATIENT_CLINIC_OR_DEPARTMENT_OTHER): Payer: Self-pay | Admitting: *Deleted

## 2017-05-23 DIAGNOSIS — I110 Hypertensive heart disease with heart failure: Secondary | ICD-10-CM | POA: Diagnosis not present

## 2017-05-23 DIAGNOSIS — I10 Essential (primary) hypertension: Secondary | ICD-10-CM | POA: Insufficient documentation

## 2017-05-23 DIAGNOSIS — W260XXA Contact with knife, initial encounter: Secondary | ICD-10-CM | POA: Insufficient documentation

## 2017-05-23 DIAGNOSIS — Z87891 Personal history of nicotine dependence: Secondary | ICD-10-CM | POA: Insufficient documentation

## 2017-05-23 DIAGNOSIS — Y93G1 Activity, food preparation and clean up: Secondary | ICD-10-CM | POA: Insufficient documentation

## 2017-05-23 DIAGNOSIS — Z7982 Long term (current) use of aspirin: Secondary | ICD-10-CM | POA: Diagnosis not present

## 2017-05-23 DIAGNOSIS — Y999 Unspecified external cause status: Secondary | ICD-10-CM | POA: Insufficient documentation

## 2017-05-23 DIAGNOSIS — Z79899 Other long term (current) drug therapy: Secondary | ICD-10-CM | POA: Diagnosis not present

## 2017-05-23 DIAGNOSIS — Z23 Encounter for immunization: Secondary | ICD-10-CM | POA: Diagnosis not present

## 2017-05-23 DIAGNOSIS — I5022 Chronic systolic (congestive) heart failure: Secondary | ICD-10-CM | POA: Diagnosis not present

## 2017-05-23 DIAGNOSIS — Y929 Unspecified place or not applicable: Secondary | ICD-10-CM | POA: Diagnosis not present

## 2017-05-23 DIAGNOSIS — S61211A Laceration without foreign body of left index finger without damage to nail, initial encounter: Secondary | ICD-10-CM

## 2017-05-23 DIAGNOSIS — J45909 Unspecified asthma, uncomplicated: Secondary | ICD-10-CM | POA: Diagnosis not present

## 2017-05-23 MED ORDER — LIDOCAINE HCL (PF) 1 % IJ SOLN
10.0000 mL | Freq: Once | INTRAMUSCULAR | Status: AC
  Administered 2017-05-23: 10 mL
  Filled 2017-05-23: qty 10

## 2017-05-23 MED ORDER — TETANUS-DIPHTH-ACELL PERTUSSIS 5-2.5-18.5 LF-MCG/0.5 IM SUSP
0.5000 mL | Freq: Once | INTRAMUSCULAR | Status: AC
  Administered 2017-05-23: 0.5 mL via INTRAMUSCULAR
  Filled 2017-05-23: qty 0.5

## 2017-05-23 NOTE — ED Triage Notes (Signed)
Laceration to his left index finger with a kitchen knife.

## 2017-05-23 NOTE — ED Notes (Signed)
Pt. Cut  L index finger on butcher knife while cutting chicken.  Pt. Has laceration with controlled bleeding.  C/o pain 5/10.  Unknown when last tetanus shot.

## 2017-05-23 NOTE — ED Provider Notes (Signed)
[facility] EMERGENCY DEPARTMENT Provider Note   CSN: [id number] Arrival date & time: 05/23/17  1856     History   Chief Complaint Chief Complaint  Patient presents with  . Laceration    HPI Brandon [name] is a 77 y.o. male with past medical history of hypertension, presenting to the ED for acute on set of laceration to left index finger that occurred prior to arrival.  Patient states he was cutting chicken, and the knife slipped causing laceration to dorsal aspect of distal left index finger.  Patient states pain is mild, 5/10 severity.  Denies decrease in range of motion or numbness.  Denies history of immunocompromise.  Last tetanus immunization is unknown.  No other injuries. The history is provided by the patient.    Past Medical History:  Diagnosis Date  . Asthma    Hx of childhood asthma, disappeared for a while, then resurfaced 6-7 years ago.   . Cardiomyopathy    with a negative cardiac catheterization in the past. (EF appriximately 40-45%)   . HTN (hypertension)    x 30 years  . Obesity, unspecified   . Sleep apnea    CPAP  . 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 Bilateral shoulder pain 08/24/2016  . Depression 12/09/2015  . Pain and swelling of knee 06/14/2014  . Edema 12/06/2011  . Asthma 08/13/2009  . Hyperglycemia 08/13/2009  . Obstructive sleep apnea 01/23/2009  . 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 OBESITY, UNSPECIFIED 08/26/2008  . 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 Essential hypertension 06/28/2008  . 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 None         Home Medications    Prior to Admission medications   Medication Sig Start Date End Date Taking? Authorizing Provider  albuterol (PROAIR HFA) 108 (90 Base) MCG/ACT inhaler Inhale 2 puffs into the lungs  every 6 (six) hours as needed for wheezing or shortness of breath. 04/22/17   [name], NP  amLODipine (NORVASC) 10 MG tablet Take 1 tablet (10 mg total) by mouth daily. 04/22/17   [name], NP  aspirin 81 MG tablet Take 81 mg by mouth daily.      [provider]  benzocaine (ORAJEL) 10 % mucosal gel Use as directed 1 application in the mouth or throat as needed for mouth pain. 04/25/17   Dartha Lodge, PA-C  Fluticasone-Salmeterol (ADVAIR DISKUS) 500-50 MCG/DOSE AEPB Inhale 1 puff into the lungs 2 (two) times daily. 04/22/17   [name], NP  losartan (COZAAR) 50 MG tablet Take 1 tablet (50 mg total) by mouth 2 (two) times daily. 04/22/17   [name], NP  metoprolol succinate (TOPROL-XL) 25 MG 24 hr tablet Take 1 tablet (25 mg total) by mouth daily. 04/22/17   [name], NP  montelukast (SINGULAIR) 10 MG tablet Take 1 tablet (10 mg total) by mouth daily with breakfast. 04/22/17   [name], NP  sertraline (ZOLOFT) 50 MG tablet Take 1 tablet (50 mg total) by mouth daily. 04/22/17   [name], NP  spironolactone (ALDACTONE) 25 MG tablet Take 1 tablet (25 mg total) by mouth daily. 04/22/17   [name], NP    Family History Family History  Problem Relation Age of Onset  . Cancer Father        multiple melanoma  .  Lupus Sister   . Asthma Daughter     Social History Social History   Tobacco Use  . Smoking status: Former Games developer  . Smokeless tobacco: Never Used  . Tobacco comment: quit smoling in 1990. started when 18, 1 ppd  Substance Use Topics  . Alcohol use: Yes    Comment: occasionaly   . Drug use: Not on file     Allergies   Ace inhibitors and Isosorb dinitrate-hydralazine   Review of Systems Review of Systems  Skin: Positive for wound.  Allergic/Immunologic: Negative for immunocompromised state.  Neurological: Negative for numbness.     Physical Exam Updated Vital Signs BP (!) 143/82   Pulse  (!) 58   Temp 98.3 F (36.8 C) (Oral)   Resp 16   Ht 6' (1.829 m)   Wt 116.1 kg (256 lb)   SpO2 98%   BMI 34.72 kg/m   Physical Exam  Constitutional: He appears well-developed and well-nourished. No distress.  HENT:  Head: Normocephalic and atraumatic.  Eyes: Conjunctivae are normal.  Cardiovascular: Normal rate and intact distal pulses.  Pulmonary/Chest: Effort normal.  Musculoskeletal:  Dorsal aspect of left distal second digit with 3cm linear laceration overlying DIP joint. Not grossly contaminated. Not actively bleeding. Distal digit is warm and pink. No ungual involvement. Normal active extension of digit and with isolation of DIP joint.  Intact distal sensation.  Psychiatric: He has a normal mood and affect. His behavior is normal.  Nursing note and vitals reviewed.    ED Treatments / Results  Labs (all labs ordered are listed, but only abnormal results are displayed) Labs Reviewed - No data to display  EKG  EKG Interpretation None       Radiology No results found.  Procedures .Marland KitchenLaceration Repair Date/Time: 05/23/2017 11:12 PM Performed by: Lenoir Facchini, [name] N, PA-C Authorized by: Saleah Rishel, [name] N, PA-C   Consent:    Consent obtained:  Verbal   Consent given by:  Patient   Risks discussed:  Infection, pain and poor cosmetic result   Alternatives discussed:  No treatment Anesthesia (see MAR for exact dosages):    Anesthesia method:  Nerve block   Block needle gauge:  25 G   Block anesthetic:  Lidocaine 1% w/o epi   Block injection procedure:  Anatomic landmarks palpated   Block outcome:  Anesthesia achieved Laceration details:    Location:  Finger   Finger location:  L index finger   Length (cm):  3 Repair type:    Repair type:  Simple Pre-procedure details:    Preparation:  Patient was prepped and draped in usual sterile fashion Exploration:    Hemostasis achieved with:  Direct pressure   Wound exploration: wound explored through full range of  motion and entire depth of wound probed and visualized     Wound extent: no foreign bodies/material noted and no tendon damage noted     Contaminated: no   Treatment:    Area cleansed with:  Saline   Amount of cleaning:  Standard   Irrigation solution:  Sterile saline   Irrigation method:  Syringe   Visualized foreign bodies/material removed: no   Skin repair:    Repair method:  Sutures   Suture size:  5-0   Wound skin closure material used: Ethilon.   Suture technique:  Simple interrupted   Number of sutures:  5 Approximation:    Approximation:  Close   Vermilion border: well-aligned   Post-procedure details:    Dressing:  Non-adherent dressing  and splint for protection   Patient tolerance of procedure:  Tolerated well, no immediate complications   (including critical care time)  Medications Ordered in ED Medications  Tdap (BOOSTRIX) injection 0.5 mL (not administered)  lidocaine (PF) (XYLOCAINE) 1 % injection 10 mL (10 mLs Infiltration Given 05/23/17 2316)     Initial Impression / Assessment and Plan / ED Course  I have reviewed the triage vital signs and the nursing notes.  Pertinent labs & imaging results that were available during my care of the patient were reviewed by me and considered in my medical decision making (see chart for details).     Pt with laceration to left index finger that occurred prior to arrival.  Wound irrigated in the ED. Wound explored and base of wound visualized in a bloodless field without evidence of foreign body or tendon injury.  Patient with preserved active and resistive extension of digit.  Laceration occurred < 8 hours prior to repair which was well tolerated.  Tdap updated.  Pt has  no comorbidities to effect normal wound healing. Pt discharged without antibiotics.  Discussed suture home care with patient and answered questions. Pt to follow-up for wound check and suture removal in 7 days; they are to return to the ED sooner for signs of  infection. Pt is hemodynamically stable with no complaints prior to dc.   Patient discussed with Dr. Fredderick Phenix.  Discussed results, findings, treatment and follow up. Patient advised of return precautions. Patient verbalized understanding and agreed with plan.  Final Clinical Impressions(s) / ED Diagnoses   Final diagnoses:  Laceration of left index finger w/o foreign body w/o damage to nail, initial encounter    ED Discharge Orders    None       Taraoluwa Thakur, [name] N, PA-C 05/23/17 2317    Rolan Bucco, MD 05/23/17 [phone]

## 2017-05-23 NOTE — Discharge Instructions (Signed)
Please read instructions below.  Keep your wound clean and covered.  Keep your wound dry for the next 24 hours.  After that time, you can wash gently with soap and water.  Pat your wounds dry and recover.  Do not soak your wound. Follow up with your primary care or urgent care for wound recheck in 7 days.  Return to the ER for fever, pus draining from wound, redness, or new or worsening symptoms.

## 2017-05-23 NOTE — ED Notes (Signed)
Patient denies pain and is resting comfortably.  

## 2017-06-03 ENCOUNTER — Encounter: Payer: Self-pay | Admitting: Family

## 2017-06-03 ENCOUNTER — Ambulatory Visit (INDEPENDENT_AMBULATORY_CARE_PROVIDER_SITE_OTHER): Payer: Medicare Other | Admitting: Family

## 2017-06-03 VITALS — BP 124/75 | HR 61 | Temp 99.1°F | Resp 16 | Ht 72.0 in | Wt 265.0 lb

## 2017-06-03 DIAGNOSIS — S61211D Laceration without foreign body of left index finger without damage to nail, subsequent encounter: Secondary | ICD-10-CM

## 2017-06-03 NOTE — Progress Notes (Signed)
Subjective:    Patient ID: Brandon [name], male    DOB: 01-13-1941, 77 y.o.   MRN: [id number]  HPI  Patient is a 77 yr old male who presents today for suture removal. He suffered a laceration on 05/23/17 while cutting chicken. Went to the ED on 3/11 and laceration was sutured.   Review of Systems     Past Medical History:  Diagnosis Date  . Asthma    Hx of childhood asthma, disappeared for a while, then resurfaced 6-7 years ago.   . Cardiomyopathy    with a negative cardiac catheterization in the past. (EF appriximately 40-45%)   . HTN (hypertension)    x 30 years  . Obesity, unspecified   . Sleep apnea    CPAP  . 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 Marital status: Married    Spouse name: [name]  . Number of children: 3  . Years of education: Not on file  . Highest education level: Not on file  Occupational History  . Occupation: retired  Engineer, production  . Financial resource strain: Not on file  . Food insecurity:    Worry: Not on file    Inability: Not on file  . Transportation needs:    Medical: Not on file    Non-medical: Not on file  Tobacco Use  . Smoking status: Former Games developer  . Smokeless tobacco: Never Used  . Tobacco comment: quit smoling in 1988/07/28. started when 18, 1 ppd  Substance and Sexual Activity  . Alcohol use: Yes    Comment: occasionaly   . Drug use: Not on file  . Sexual activity: Not on file  Lifestyle  . Physical activity:    Days per week: Not on file    Minutes per session: Not on file  . Stress: Not on file  Relationships  . Social connections:    Talks on phone: Not on file    Gets together: Not on file    Attends religious service: Not on file    Active member of club or organization: Not on file    Attends meetings of clubs or organizations: Not on file    Relationship status: Not on file  . Intimate partner violence:    Fear of current or ex partner: Not on  file    Emotionally abused: Not on file    Physically abused: Not on file    Forced sexual activity: Not on file  Other Topics Concern  . Not on file  Social History Narrative   Grew up in [place], attended [employer]. First wife died of breast ca in 07/28/97. 3 children. Remarried- 8 years. 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    Pt signed designated party release granting access to [facility] to his wife [name]. Detailed message may be left on home or cell phone. Roselle Locus August 13, 2009 11:34 am.     Past Surgical History:  Procedure Laterality Date  . None      Family History  Problem Relation Age of Onset  . Cancer Father        multiple melanoma  . Lupus Sister   . Asthma Daughter     Allergies  Allergen Reactions  . Ace Inhibitors     REACTION: cough  . Isosorb Dinitrate-Hydralazine     REACTION: dizziness/hypotensive    Current Outpatient Medications on File Prior  to Visit  Medication Sig Dispense Refill  . albuterol (PROAIR HFA) 108 (90 Base) MCG/ACT inhaler Inhale 2 puffs into the lungs every 6 (six) hours as needed for wheezing or shortness of breath. 1 Inhaler 5  . amLODipine (NORVASC) 10 MG tablet Take 1 tablet (10 mg total) by mouth daily. 30 tablet 5  . aspirin 81 MG tablet Take 81 mg by mouth daily.      . benzocaine (ORAJEL) 10 % mucosal gel Use as directed 1 application in the mouth or throat as needed for mouth pain. 5.3 g 0  . Fluticasone-Salmeterol (ADVAIR DISKUS) 500-50 MCG/DOSE AEPB Inhale 1 puff into the lungs 2 (two) times daily. 60 each 5  . losartan (COZAAR) 50 MG tablet Take 1 tablet (50 mg total) by mouth 2 (two) times daily. 60 tablet 5  . metoprolol succinate (TOPROL-XL) 25 MG 24 hr tablet Take 1 tablet (25 mg total) by mouth daily. 30 tablet 5  . montelukast (SINGULAIR) 10 MG tablet Take 1 tablet (10 mg total) by mouth daily with breakfast. 90 tablet 1  . sertraline (ZOLOFT) 50 MG tablet Take 1 tablet (50 mg total)  by mouth daily. 30 tablet 15  . spironolactone (ALDACTONE) 25 MG tablet Take 1 tablet (25 mg total) by mouth daily. 30 tablet 5   No current facility-administered medications on file prior to visit.     BP 124/75 (BP Location: Right Arm, Patient Position: Sitting, Cuff Size: Large)   Pulse 61   Temp 99.1 F (37.3 C) (Oral)   Resp 16   Ht 6' (1.829 m)   Wt 265 lb (120.2 kg)   SpO2 97%   BMI 35.94 kg/m    Objective:   Physical Exam  Constitutional: He is oriented to person, place, and time. He appears well-developed and well-nourished. No distress.  Neurological: He is alert and oriented to person, place, and time.  Skin: Skin is warm and dry.  5 sutures left dorsal index finger- intact.  Wound is well healed  Psychiatric: He has a normal mood and affect. His behavior is normal. Judgment and thought content normal.          Assessment & Plan:  Finger laceration- well healed.  5 sutures removed, pt tolerated without difficulty.

## 2017-06-06 ENCOUNTER — Telehealth: Payer: Self-pay | Admitting: Family

## 2017-06-06 DIAGNOSIS — I10 Essential (primary) hypertension: Secondary | ICD-10-CM

## 2017-06-06 MED ORDER — IRBESARTAN 75 MG PO TABS: 75.0000 mg | ORAL_TABLET | Freq: Every day | ORAL | 2 refills | Status: DC

## 2017-06-06 NOTE — Telephone Encounter (Signed)
Pt aware of message

## 2017-06-06 NOTE — Telephone Encounter (Signed)
Received note from pharmacy that losartan is on back order. When he runs out he should switch to avapro 75mg  and follow up in 1-2 week for nurse visit for BP check and follow up bmet.

## 2017-06-06 NOTE — Telephone Encounter (Signed)
Did pt decline to schedule nurse and lab appointment as recommended by his PCP?

## 2017-06-06 NOTE — Telephone Encounter (Signed)
Left message for pt to return my call. Pt will need to schedule nurse and lab visit in 1-2 weeks as advised below. Future lab order has been entered.

## 2017-06-09 ENCOUNTER — Ambulatory Visit (INDEPENDENT_AMBULATORY_CARE_PROVIDER_SITE_OTHER): Payer: Medicare Other | Admitting: Pulmonary Disease

## 2017-06-09 ENCOUNTER — Encounter: Payer: Self-pay | Admitting: Pulmonary Disease

## 2017-06-09 VITALS — BP 118/74 | HR 64 | Ht 72.0 in | Wt 269.0 lb

## 2017-06-09 DIAGNOSIS — G4733 Obstructive sleep apnea (adult) (pediatric): Secondary | ICD-10-CM | POA: Diagnosis not present

## 2017-06-09 DIAGNOSIS — Z9989 Dependence on other enabling machines and devices: Secondary | ICD-10-CM

## 2017-06-09 NOTE — Patient Instructions (Signed)
Will arrange for new CPAP mask and supplies  Call if you are still having trouble using CPAP after getting new supplies  Follow up in 1 year

## 2017-06-09 NOTE — Progress Notes (Signed)
[facility]  Chief Complaint  Patient presents with  . Sleep Consult    Referred by Dr. Peggyann Juba for OSA. Pt is already using a CPAP machine.     Vital signs: BP 118/74 (BP Location: Right Arm, Patient Position: Sitting, Cuff Size: Normal)   Pulse 64   Ht 6' (1.829 m)   Wt 269 lb (122 kg)   SpO2 95%   BMI 36.48 kg/m   History of Present Illness: Brandon [name] is a 77 y.o. male for evaluation of sleep problems.  He had sleep study in 2016.  This showed moderate sleep apnea.  He was started on CPAP.  He did well with this.  He had trouble cleaning supplies and mask would cause irritation.  He also would get a cough in the morning if his machine and mask weren't clean.  This made it difficult to use.  His sleep is worse w/o CPAP.  He snores, and feels his breathing cut off.  He goes to sleep at 1 am.  He falls asleep after watching TV.  He wakes up some times to use the bathroom.  He gets out of bed at 11 am.  He feels tire in the morning if he doesn't use CPAP.  He denies morning headache.  He does not use anything to help him fall sleep or stay awake.  He denies sleep walking, sleep talking, bruxism, or nightmares.  There is no history of restless legs.  He denies sleep hallucinations, sleep paralysis, or cataplexy.  The Epworth score is 7 out of 24.    Physical Exam:  General - pleasant Eyes - pupils reactive ENT - no sinus tenderness, no oral exudate, no LAN, wears dentures Cardiac - regular, no murmur Chest - no wheeze, rales Abd - soft, non tender Ext - no edema Skin - no rashes Neuro - normal strength Psych - normal mood  Discussion: He has moderate sleep apnea.  He was having trouble with is equipment.  Otherwise he was doing well with CPAP.    We discussed how sleep apnea can affect various health problems, including risks for hypertension, cardiovascular disease, and diabetes.  We also discussed how sleep disruption can  increase risks for accidents, such as while driving.  Weight loss as a means of improving sleep apnea was also reviewed.  Additional treatment options discussed were CPAP therapy, oral appliance, and surgical intervention.  Assessment/Plan:  Obstructive sleep apnea. - will arrange for new supplies and mask - he reports benefit from CPAP otherwise - continue CPAP 13 cm H2O - discussed options for cleaning his equipment   Patient Instructions  Will arrange for new CPAP mask and supplies  Call if you are still having trouble using CPAP after getting new supplies  Follow up in 1 year    Coralyn Helling, MD [facility] 06/09/2017, 9:24 AM Pager:  [phone]  Flow Sheet  Sleep tests: PSG 05/06/14 >> AHI 24.8, SpO2 low 89%  Cardiac tests: Echo 08/17/16 >> EF 40 to 45%, grade 2 DD  Review of Systems: 12 point ROS negative  Past Medical History: He  has a past medical history of Asthma, Cardiomyopathy, HTN (hypertension), Obesity, unspecified, Sleep apnea, and 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  Past Surgical History: He  has a past surgical history that includes None.  Family History: His family history includes Asthma in his daughter; Cancer in his father; Lupus in his sister.  Social History: He  reports that he has quit smoking. He has never used smokeless tobacco. He reports that he drinks alcohol.  Medications: Allergies as of 06/09/2017      Reactions   Ace Inhibitors    REACTION: cough   Isosorb Dinitrate-hydralazine    REACTION: dizziness/hypotensive      Medication List        Accurate as of 06/09/17  9:24 AM. Always use your most recent med list.          albuterol 108 (90 Base) MCG/ACT inhaler Commonly known as:  PROAIR HFA Inhale 2 puffs into the lungs every 6 (six) hours as needed for wheezing or shortness of breath.   amLODipine 10 MG tablet Commonly known as:  NORVASC Take 1 tablet (10 mg total) by  mouth daily.   aspirin 81 MG tablet Take 81 mg by mouth daily.   benzocaine 10 % mucosal gel Commonly known as:  ORAJEL Use as directed 1 application in the mouth or throat as needed for mouth pain.   Fluticasone-Salmeterol 500-50 MCG/DOSE Aepb Commonly known as:  ADVAIR DISKUS Inhale 1 puff into the lungs 2 (two) times daily.   irbesartan 75 MG tablet Commonly known as:  AVAPRO Take 1 tablet (75 mg total) by mouth daily.   metoprolol succinate 25 MG 24 hr tablet Commonly known as:  TOPROL-XL Take 1 tablet (25 mg total) by mouth daily.   montelukast 10 MG tablet Commonly known as:  SINGULAIR Take 1 tablet (10 mg total) by mouth daily with breakfast.   sertraline 50 MG tablet Commonly known as:  ZOLOFT Take 1 tablet (50 mg total) by mouth daily.   spironolactone 25 MG tablet Commonly known as:  ALDACTONE Take 1 tablet (25 mg total) by mouth daily.

## 2017-06-10 ENCOUNTER — Other Ambulatory Visit: Payer: Self-pay

## 2017-06-10 DIAGNOSIS — G4733 Obstructive sleep apnea (adult) (pediatric): Secondary | ICD-10-CM

## 2017-06-10 DIAGNOSIS — Z9989 Dependence on other enabling machines and devices: Principal | ICD-10-CM

## 2017-06-19 NOTE — Telephone Encounter (Signed)
Let's mail him a recall letter to schedule appointment please.

## 2017-09-07 ENCOUNTER — Encounter: Payer: Self-pay | Admitting: Cardiology

## 2017-09-07 ENCOUNTER — Ambulatory Visit: Payer: Medicare Other | Admitting: Cardiology

## 2017-09-07 VITALS — BP 142/90 | HR 63 | Ht 72.0 in | Wt 262.0 lb

## 2017-09-07 DIAGNOSIS — I428 Other cardiomyopathies: Secondary | ICD-10-CM | POA: Diagnosis not present

## 2017-09-07 DIAGNOSIS — I10 Essential (primary) hypertension: Secondary | ICD-10-CM

## 2017-09-07 NOTE — Patient Instructions (Signed)
Dr Hochrein recommends that you schedule a follow-up appointment in 12 months. You will receive a reminder letter in the mail two months in advance. If you don't receive a letter, please call our office to schedule the follow-up appointment.  If you need a refill on your cardiac medications before your next appointment, please call your pharmacy. 

## 2017-09-07 NOTE — Progress Notes (Signed)
HPI The patient presents for followup of known cardiomyopathy. Since I last saw him he has felt well.  He was golfing in at [facility] last week and did well with this.  The patient denies any new symptoms such as chest discomfort, neck or arm discomfort. There has been no new shortness of breath, PND or orthopnea. There have been no reported palpitations, presyncope or syncope.    Allergies  Allergen Reactions  . Ace Inhibitors     REACTION: cough  . Isosorb Dinitrate-Hydralazine     REACTION: dizziness/hypotensive    Current Outpatient Medications  Medication Sig Dispense Refill  . albuterol (PROAIR HFA) 108 (90 Base) MCG/ACT inhaler Inhale 2 puffs into the lungs every 6 (six) hours as needed for wheezing or shortness of breath. 1 Inhaler 5  . amLODipine (NORVASC) 10 MG tablet Take 1 tablet (10 mg total) by mouth daily. 30 tablet 5  . aspirin 81 MG tablet Take 81 mg by mouth daily.      . Fluticasone-Salmeterol (ADVAIR DISKUS) 500-50 MCG/DOSE AEPB Inhale 1 puff into the lungs 2 (two) times daily. 60 each 5  . irbesartan (AVAPRO) 75 MG tablet Take 1 tablet (75 mg total) by mouth daily. 30 tablet 2  . metoprolol succinate (TOPROL-XL) 25 MG 24 hr tablet Take 1 tablet (25 mg total) by mouth daily. 30 tablet 5  . montelukast (SINGULAIR) 10 MG tablet Take 1 tablet (10 mg total) by mouth daily with breakfast. 90 tablet 1  . sertraline (ZOLOFT) 50 MG tablet Take 1 tablet (50 mg total) by mouth daily. 30 tablet 15  . spironolactone (ALDACTONE) 25 MG tablet Take 1 tablet (25 mg total) by mouth daily. 30 tablet 5   No current facility-administered medications for this visit.     Past Medical History:  Diagnosis Date  . Asthma    Hx of childhood asthma, disappeared for a while, then resurfaced 6-7 years ago.   . Cardiomyopathy    with a negative cardiac catheterization in the past. (EF appriximately 40-45%)   . HTN (hypertension)    x 30 years  . Obesity, unspecified   . Sleep apnea     CPAP  . 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 None      ROS: As stated in the HPI and negative for all other systems.  PHYSICAL EXAM BP (!) 142/90   Pulse 63   Ht 6' (1.829 m)   Wt 262 lb (118.8 kg)   BMI 35.53 kg/m   GENERAL:  Well appearing NECK:  No jugular venous distention, waveform within normal limits, carotid upstroke brisk and symmetric, no bruits, no thyromegaly LUNGS:  Clear to auscultation bilaterally CHEST:  Unremarkable HEART:  PMI not displaced or sustained,S1 and S2 within normal limits, no S3, no S4, no clicks, no rubs, no murmurs ABD:  Flat, positive bowel sounds normal in frequency in pitch, no bruits, no rebound, no guarding, no midline pulsatile mass, no hepatomegaly, no splenomegaly EXT:  2 plus pulses throughout, mild edema, no cyanosis no clubbing   EKG:   Sinus rhythm, rate 63, axis within normal limits, RBBB (new since previous.) no acute ST-T wave changes.   09/07/2017  ASSESSMENT AND PLAN  CARDIOMYOPATHY -  EF was mildly reduced and unchanged from previous last year.  This was 40 to 45%.  He has no new symptoms.  No further testing is indicated.  HYPERTENSION -  BP is at target at home.  It is very mildly elevated.  He needs continued lifestyle change to include weight loss.   OBSTRUCTIVE SLEEP APNEA -   He is wearing the CPAP every night.  No change in therapy.   OBESITY, UNSPECIFIED -  We talked again about this.  No further testing.

## 2017-09-08 ENCOUNTER — Ambulatory Visit: Payer: Medicare Other | Admitting: Cardiology

## 2017-09-09 ENCOUNTER — Telehealth: Payer: Self-pay | Admitting: *Deleted

## 2017-09-09 NOTE — Telephone Encounter (Addendum)
Received fax from [facility] requesting refill of losartan. Pt was changed to Avapro and pt has not been back in for follow up w/PCP since this change (see 06/06/17 phone note). Losartan request denied at this time. Detailed message left for pt to call and schedule follow up with PCP to discuss medication management. Ok for [facility] / Triage to schedule appt for pt.

## 2017-09-16 ENCOUNTER — Telehealth: Payer: Self-pay

## 2017-09-16 MED ORDER — LOSARTAN POTASSIUM 50 MG PO TABS: 50.0000 mg | ORAL_TABLET | Freq: Two times a day (BID) | ORAL | 3 refills | Status: DC

## 2017-09-16 NOTE — Telephone Encounter (Signed)
Author phoned [employer] re: confusion over avapro rx. Per [name], NP documentation on 3/25, pt. placed on avapro to replace losartan during pharmacy supply shortage. Pt. stated that the pharmacy ended up having a supply of losartan on hand though, and in combination with some supply found at home, pt. did not need, and had never started the new avapro medication ordered of 3/25. Pt. stated he is currently out of losartan, however. Losartan re-ordered per previous sig., and [name] made aware. Pt. states he recently followed-up with cardiology, and "things are good from that standpoint". Pt. declined to make a follow up visit with [name] at this time.

## 2017-11-09 ENCOUNTER — Telehealth: Payer: Self-pay | Admitting: Family

## 2017-11-11 ENCOUNTER — Other Ambulatory Visit: Payer: Self-pay | Admitting: *Deleted

## 2017-11-11 ENCOUNTER — Telehealth: Payer: Self-pay | Admitting: Family

## 2017-11-11 MED ORDER — METOPROLOL SUCCINATE ER 25 MG PO TB24: 25.0000 mg | ORAL_TABLET | Freq: Every day | ORAL | 5 refills | Status: DC

## 2017-11-11 NOTE — Telephone Encounter (Signed)
Rx refilled per office protocol- LOV: 04/2017 addressed

## 2017-11-11 NOTE — Telephone Encounter (Unsigned)
Copied from CRM [id number]. Topic: Quick Communication - Rx Refill/Question >> Nov 11, 2017  2:40 PM Mcneil, Ja-Kwan wrote: Medication: metoprolol succinate (TOPROL-XL) 25 MG 24 hr tablet  Has the patient contacted their pharmacy? yes   Preferred Pharmacy (with phone number or street name): [facility] [address] [place], [place] - [address] [phone] (Phone) [phone] (Fax)  Agent: Please be advised that RX refills may take up to 3 business days. We ask that you follow-up with your pharmacy.

## 2017-11-11 NOTE — Telephone Encounter (Signed)
Refills sent to pharmacy. Pt is due for 6 month follow up with [name] on 12/04/17 and has not scheduled appt yet. Please call pt to set up appt. Thanks!

## 2017-11-16 NOTE — Telephone Encounter (Signed)
Called pt and LVM regarding his refill request. Informed pt that the refills were sent, but he is due for a follow up towards the end of Sept. Advised pt to call and schedule an appt at his earliest convenience.

## 2017-11-21 ENCOUNTER — Telehealth: Payer: Self-pay | Admitting: Family

## 2017-11-21 NOTE — Telephone Encounter (Signed)
Not a patient at this location 

## 2017-11-21 NOTE — Telephone Encounter (Unsigned)
Copied from CRM [id number]. Topic: Quick Communication - Rx Refill/Question >> Nov 21, 2017  5:05 PM Floria Raveling A wrote: Medication: Fluticasone-Salmeterol (ADVAIR DISKUS) 500-50 MCG/DOSE AEPB [[id number]]  Has the patient contacted their pharmacy? No  (Agent: If no, request that the patient contact the pharmacy for the refill.) (Agent: If yes, when and what did the pharmacy advise?)  Preferred Pharmacy (with phone number or street name):[facility] [address] [place], [place] - [address] [phone] (Phone   Agent: Please be advised that RX refills may take up to 3 business days. We ask that you follow-up with your pharmacy.

## 2017-11-30 ENCOUNTER — Other Ambulatory Visit: Payer: Self-pay

## 2017-11-30 MED ORDER — FLUTICASONE-SALMETEROL 500-50 MCG/DOSE IN AEPB: 1.0000 | INHALATION_SPRAY | Freq: Two times a day (BID) | RESPIRATORY_TRACT | 5 refills | Status: DC

## 2017-11-30 NOTE — Telephone Encounter (Signed)
Patient is requesting this medication be sent to [facility]: [address] [place], [address]  [phone]. [place] is now out of [place], please advise

## 2017-11-30 NOTE — Telephone Encounter (Signed)
Patient has appointment scheduled for 12-06-17. rx was sent.

## 2017-12-06 ENCOUNTER — Other Ambulatory Visit: Payer: Self-pay

## 2017-12-06 ENCOUNTER — Ambulatory Visit: Payer: Medicare Other | Admitting: Family

## 2017-12-06 MED ORDER — AMLODIPINE BESYLATE 10 MG PO TABS: 10.0000 mg | ORAL_TABLET | Freq: Every day | ORAL | 0 refills | Status: DC

## 2017-12-14 ENCOUNTER — Ambulatory Visit (INDEPENDENT_AMBULATORY_CARE_PROVIDER_SITE_OTHER): Payer: Medicare Other | Admitting: Family

## 2017-12-14 ENCOUNTER — Encounter: Payer: Self-pay | Admitting: Family

## 2017-12-14 VITALS — BP 129/75 | HR 63 | Temp 99.3°F | Resp 16 | Ht 72.0 in | Wt 261.8 lb

## 2017-12-14 DIAGNOSIS — Z23 Encounter for immunization: Secondary | ICD-10-CM | POA: Diagnosis not present

## 2017-12-14 DIAGNOSIS — J4521 Mild intermittent asthma with (acute) exacerbation: Secondary | ICD-10-CM

## 2017-12-14 DIAGNOSIS — F32A Depression, unspecified: Secondary | ICD-10-CM

## 2017-12-14 DIAGNOSIS — G4733 Obstructive sleep apnea (adult) (pediatric): Secondary | ICD-10-CM

## 2017-12-14 DIAGNOSIS — F329 Major depressive disorder, single episode, unspecified: Secondary | ICD-10-CM | POA: Diagnosis not present

## 2017-12-14 DIAGNOSIS — R739 Hyperglycemia, unspecified: Secondary | ICD-10-CM | POA: Diagnosis not present

## 2017-12-14 DIAGNOSIS — I10 Essential (primary) hypertension: Secondary | ICD-10-CM

## 2017-12-14 MED ORDER — SERTRALINE HCL 50 MG PO TABS: 50.0000 mg | ORAL_TABLET | Freq: Every day | ORAL | 1 refills | Status: DC

## 2017-12-14 MED ORDER — PREDNISONE 10 MG PO TABS: ORAL_TABLET | ORAL | 0 refills | Status: DC

## 2017-12-14 MED ORDER — METOPROLOL SUCCINATE ER 25 MG PO TB24: 25.0000 mg | ORAL_TABLET | Freq: Every day | ORAL | 1 refills | Status: DC

## 2017-12-14 MED ORDER — SPIRONOLACTONE 25 MG PO TABS
25.0000 mg | ORAL_TABLET | Freq: Every day | ORAL | 1 refills | Status: DC
Start: 2017-12-14 — End: 2018-07-13

## 2017-12-14 MED ORDER — MONTELUKAST SODIUM 10 MG PO TABS: 10.0000 mg | ORAL_TABLET | Freq: Every day | ORAL | 1 refills | Status: DC

## 2017-12-14 MED ORDER — AMLODIPINE BESYLATE 10 MG PO TABS: 10.0000 mg | ORAL_TABLET | Freq: Every day | ORAL | 1 refills | Status: DC

## 2017-12-14 NOTE — Patient Instructions (Addendum)
Begin prednisone taper for asthma. Let me know if your wheezing worsens or fails to improve.  Complete lab work prior to leaving.

## 2017-12-14 NOTE — Progress Notes (Signed)
Subjective:    Patient ID: Brandon [name], male    DOB: Aug 17, 1940, 77 y.o.   MRN: [id number]  HPI  Patient is a 77 yr old male who presents today for follow up.  Asthma- had some wheezing when he was out of town. He reports that he uses proair about 3-4 times a week.  Continues advair.  He is also maintained on singulair.   HTN- he reports some LE edema, worse with travel. Uses compression socks prn.  Denies CP. BP Readings from Last 3 Encounters:  12/14/17 129/75  09/07/17 (!) 142/90  06/09/17 118/74   Depression-  continues zoloft.  Reports tha this mood is good.    OSA- continues cpap every night. Reports that he wakes up refreshed. Lab Results  Component Value Date   HGBA1C 6.2 08/23/2016   Lab Results  Component Value Date   PSA 1.34 08/13/2009     Review of Systems See HPI  Past Medical History:  Diagnosis Date  . Asthma    Hx of childhood asthma, disappeared for a while, then resurfaced 6-7 years ago.   . Cardiomyopathy    with a negative cardiac catheterization in the past. (EF appriximately 40-45%)   . HTN (hypertension)    x 30 years  . Obesity, unspecified   . Sleep apnea    CPAP  . 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 Marital status: Married    Spouse name: [name]  . Number of children: 3  . Years of education: Not on file  . Highest education level: Not on file  Occupational History  . Occupation: retired  Engineer, production  . Financial resource strain: Not on file  . Food insecurity:    Worry: Not on file    Inability: Not on file  . Transportation needs:    Medical: Not on file    Non-medical: Not on file  Tobacco Use  . Smoking status: Former Games developer  . Smokeless tobacco: Never Used  . Tobacco comment: quit smoling in Jul 25, 1988. started when 18, 1 ppd  Substance and Sexual Activity  . Alcohol use: Yes    Comment: occasionaly   . Drug use: Not on file  . Sexual activity:  Not on file  Lifestyle  . Physical activity:    Days per week: Not on file    Minutes per session: Not on file  . Stress: Not on file  Relationships  . Social connections:    Talks on phone: Not on file    Gets together: Not on file    Attends religious service: Not on file    Active member of club or organization: Not on file    Attends meetings of clubs or organizations: Not on file    Relationship status: Not on file  . Intimate partner violence:    Fear of current or ex partner: Not on file    Emotionally abused: Not on file    Physically abused: Not on file    Forced sexual activity: Not on file  Other Topics Concern  . Not on file  Social History Narrative   Grew up in [place], attended [employer]. First wife died of breast ca in 07/25/97. 3 children. Remarried- 8 years. 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    Pt signed designated party release granting access to [facility] to his wife [name]. Detailed message may be  left on home or cell phone. Roselle Locus August 13, 2009 11:34 am.     Past Surgical History:  Procedure Laterality Date  . None      Family History  Problem Relation Age of Onset  . Cancer Father        multiple melanoma  . Lupus Sister   . Asthma Daughter     Allergies  Allergen Reactions  . Ace Inhibitors     REACTION: cough  . Isosorb Dinitrate-Hydralazine     REACTION: dizziness/hypotensive    Current Outpatient Medications on File Prior to Visit  Medication Sig Dispense Refill  . albuterol (PROAIR HFA) 108 (90 Base) MCG/ACT inhaler Inhale 2 puffs into the lungs every 6 (six) hours as needed for wheezing or shortness of breath. 1 Inhaler 5  . aspirin 81 MG tablet Take 81 mg by mouth daily.      . Fluticasone-Salmeterol (ADVAIR DISKUS) 500-50 MCG/DOSE AEPB Inhale 1 puff into the lungs 2 (two) times daily. 60 each 5  . losartan (COZAAR) 50 MG tablet Take 1 tablet (50 mg total) by mouth 2 (two) times daily. 90 tablet 3     No current facility-administered medications on file prior to visit.     BP 129/75 (BP Location: Right Arm, Patient Position: Sitting, Cuff Size: Large)   Pulse 63   Temp 99.3 F (37.4 C) (Oral)   Resp 16   Ht 6' (1.829 m)   Wt 261 lb 12.8 oz (118.8 kg)   SpO2 93%   BMI 35.51 kg/m       Objective:   Physical Exam  Constitutional: He is oriented to person, place, and time. He appears well-developed and well-nourished. No distress.  HENT:  Head: Normocephalic and atraumatic.  Cardiovascular: Normal rate and regular rhythm.  No murmur heard. Pulmonary/Chest: Effort normal. No respiratory distress. He has wheezes. He has no rales.  Musculoskeletal: He exhibits no edema.  Neurological: He is alert and oriented to person, place, and time.  Skin: Skin is warm and dry.  Psychiatric: He has a normal mood and affect. His behavior is normal. Thought content normal.          Assessment & Plan:  HTN- BP stable, continue current meds.   Asthma- uncontrolled.  Will rx with short course of prednisone taper. Continue singulair, advair/albuterol.   Depression- stable on zoloft. Continue same.  OSA- stable on cpap, follows with Dr. Craige Cotta.   Hyperglycemia- obtain follow up A1C.    Flu shot toda.

## 2017-12-19 ENCOUNTER — Telehealth: Payer: Self-pay | Admitting: Family

## 2017-12-19 NOTE — Telephone Encounter (Signed)
Called patient, he will come in tomorrow at 1:45 pm

## 2017-12-19 NOTE — Telephone Encounter (Signed)
Looks like pt forgot to go to the lab after his visit.  Please contact him to schedule lab draw.

## 2017-12-20 ENCOUNTER — Other Ambulatory Visit (INDEPENDENT_AMBULATORY_CARE_PROVIDER_SITE_OTHER): Payer: Medicare Other

## 2017-12-20 DIAGNOSIS — R739 Hyperglycemia, unspecified: Secondary | ICD-10-CM

## 2017-12-20 LAB — BASIC METABOLIC PANEL
BUN: 18 mg/dL (ref 6–23)
CHLORIDE: 105 meq/L (ref 96–112)
CO2: 27 meq/L (ref 19–32)
Calcium: 10 mg/dL (ref 8.4–10.5)
Creatinine, Ser: 1.59 mg/dL — ABNORMAL HIGH (ref 0.40–1.50)
GFR: 54.57 mL/min — ABNORMAL LOW (ref >60.00)
Glucose, Bld: 107 mg/dL — ABNORMAL HIGH (ref 70–99)
Potassium: 4.7 mEq/L (ref 3.5–5.1)
Sodium: 137 mEq/L (ref 135–145)

## 2017-12-20 LAB — HEMOGLOBIN A1C: HEMOGLOBIN A1C: 6 % (ref 4.6–6.5)

## 2017-12-21 ENCOUNTER — Encounter: Payer: Self-pay | Admitting: Family

## 2018-04-21 ENCOUNTER — Other Ambulatory Visit: Payer: Self-pay

## 2018-04-21 ENCOUNTER — Emergency Department (HOSPITAL_BASED_OUTPATIENT_CLINIC_OR_DEPARTMENT_OTHER)
Admission: EM | Admit: 2018-04-21 | Discharge: 2018-04-21 | Disposition: A | Discharge status: Home / Self Care | Payer: Medicare Other | Attending: Emergency Medicine | Admitting: Emergency Medicine

## 2018-04-21 ENCOUNTER — Encounter (HOSPITAL_BASED_OUTPATIENT_CLINIC_OR_DEPARTMENT_OTHER): Payer: Self-pay | Admitting: Adult Health

## 2018-04-21 DIAGNOSIS — Z87891 Personal history of nicotine dependence: Secondary | ICD-10-CM | POA: Insufficient documentation

## 2018-04-21 DIAGNOSIS — Z7982 Long term (current) use of aspirin: Secondary | ICD-10-CM | POA: Diagnosis not present

## 2018-04-21 DIAGNOSIS — I5022 Chronic systolic (congestive) heart failure: Secondary | ICD-10-CM | POA: Insufficient documentation

## 2018-04-21 DIAGNOSIS — I110 Hypertensive heart disease with heart failure: Secondary | ICD-10-CM | POA: Insufficient documentation

## 2018-04-21 DIAGNOSIS — K047 Periapical abscess without sinus: Secondary | ICD-10-CM | POA: Diagnosis not present

## 2018-04-21 DIAGNOSIS — Z79899 Other long term (current) drug therapy: Secondary | ICD-10-CM | POA: Diagnosis not present

## 2018-04-21 DIAGNOSIS — J45909 Unspecified asthma, uncomplicated: Secondary | ICD-10-CM | POA: Diagnosis not present

## 2018-04-21 DIAGNOSIS — K0889 Other specified disorders of teeth and supporting structures: Secondary | ICD-10-CM | POA: Diagnosis present

## 2018-04-21 MED ORDER — AMOXICILLIN 500 MG PO CAPS: 500.0000 mg | ORAL_CAPSULE | Freq: Three times a day (TID) | ORAL | 0 refills | Status: DC

## 2018-04-21 MED ORDER — TRAMADOL HCL 50 MG PO TABS: 50.0000 mg | ORAL_TABLET | Freq: Two times a day (BID) | ORAL | 0 refills | Status: DC | PRN

## 2018-04-21 NOTE — ED Notes (Signed)
C/o rt lower tooth pain x 2-3 days  otc meds not reducing pain

## 2018-04-21 NOTE — Discharge Instructions (Signed)
Please read and follow all provided instructions.  Your diagnoses today include:  1. Dental infection    The exam and treatment you received today has been provided on an emergency basis only. This is not a substitute for complete medical or dental care.  Tests performed today include:  Vital signs. See below for your results today.   Medications prescribed:   Amoxicillin - antibiotic  You have been prescribed an antibiotic medicine: take the entire course of medicine even if you are feeling better. Stopping early can cause the antibiotic not to work.   Tramadol - narcotic-like pain medication  DO NOT drive or perform any activities that require you to be awake and alert because this medicine can make you drowsy.   Use pain medication only under direct supervision at the lowest possible dose needed to control your pain.   Take any prescribed medications only as directed.  Home care instructions:  Follow any educational materials contained in this packet.  Follow-up instructions: Please follow-up with your dentist for further evaluation of your symptoms.   Return instructions:   Please return to the Emergency Department if you experience worsening symptoms.  Please return if you develop a fever, you develop more swelling in your face or neck, you have trouble breathing or swallowing food.  Please return if you have any other emergent concerns.  Additional Information:  Your vital signs today were: BP (!) 143/99    Pulse (!) 58    Temp 98.7 F (37.1 C) (Oral)    Resp 16    SpO2 98%  If your blood pressure (BP) was elevated above 135/85 this visit, please have this repeated by your doctor within one month. --------------

## 2018-04-21 NOTE — ED Triage Notes (Addendum)
Presents with 2 days of lower right sided molar pain. Poor dentition noted. Taking tylenol with no relief. Last dose at 2 pm

## 2018-04-21 NOTE — ED Provider Notes (Signed)
[facility] EMERGENCY DEPARTMENT Provider Note   CSN: [id number] Arrival date & time: 04/21/18  1512     History   Chief Complaint Chief Complaint  Patient presents with  . Dental Pain    HPI Brandon [name] is a 78 y.o. male.  Patient presents with 2 days of right lower jaw pain and swelling.  Patient thinks that he has a dental abscess.  He has had similar symptoms in the past, however is unable to follow-up with his dentist until next week.  He has been taking Tylenol without much improvement.  He had difficulty sleeping last night.  No fevers, difficulty breathing or swallowing.  He is able to move his neck without pain or discomfort.  Onset of symptoms acute.  Course is worsening.  Nothing make symptoms better.     Past Medical History:  Diagnosis Date  . Asthma    Hx of childhood asthma, disappeared for a while, then resurfaced 6-7 years ago.   . Cardiomyopathy    with a negative cardiac catheterization in the past. (EF appriximately 40-45%)   . HTN (hypertension)    x 30 years  . Obesity, unspecified   . Sleep apnea    CPAP  . 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 Bilateral shoulder pain 08/24/2016  . Depression 12/09/2015  . Pain and swelling of knee 06/14/2014  . Edema 12/06/2011  . Asthma 08/13/2009  . Hyperglycemia 08/13/2009  . Obstructive sleep apnea 01/23/2009  . 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 OBESITY, UNSPECIFIED 08/26/2008  . 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 Essential hypertension 06/28/2008  . 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 None          Home Medications    Prior to Admission medications   Medication Sig Start Date End Date Taking? Authorizing Provider  albuterol (PROAIR HFA) 108 (90 Base) MCG/ACT inhaler Inhale 2 puffs into the lungs  every 6 (six) hours as needed for wheezing or shortness of breath. 04/22/17   [name], NP  amLODipine (NORVASC) 10 MG tablet Take 1 tablet (10 mg total) by mouth daily. 12/14/17   [name], NP  amoxicillin (AMOXIL) 500 MG capsule Take 1 capsule (500 mg total) by mouth 3 (three) times daily. 04/21/18   Renne Crigler, PA-C  aspirin 81 MG tablet Take 81 mg by mouth daily.      [provider]  Fluticasone-Salmeterol (ADVAIR DISKUS) 500-50 MCG/DOSE AEPB Inhale 1 puff into the lungs 2 (two) times daily. 11/30/17   [name], NP  losartan (COZAAR) 50 MG tablet Take 1 tablet (50 mg total) by mouth 2 (two) times daily. 09/16/17   [name], NP  metoprolol succinate (TOPROL-XL) 25 MG 24 hr tablet Take 1 tablet (25 mg total) by mouth daily. 12/14/17   [name], NP  montelukast (SINGULAIR) 10 MG tablet Take 1 tablet (10 mg total) by mouth at bedtime. 12/14/17   [name], NP  predniSONE (DELTASONE) 10 MG tablet 4 tabs by mouth once daily for 2 days, then 3 tabs daily x 2 days, then 2 tabs daily x 2 days, then 1 tab daily x 2 days 12/14/17   [name], NP  sertraline (ZOLOFT) 50 MG tablet Take 1 tablet (50 mg total) by mouth daily. 12/14/17   [name], NP  spironolactone (ALDACTONE)  25 MG tablet Take 1 tablet (25 mg total) by mouth daily. 12/14/17   [name], NP  traMADol (ULTRAM) 50 MG tablet Take 1 tablet (50 mg total) by mouth every 12 (twelve) hours as needed. 04/21/18   Renne Crigler, PA-C    Family History Family History  Problem Relation Age of Onset  . Cancer Father        multiple melanoma  . Lupus Sister   . Asthma Daughter     Social History Social History   Tobacco Use  . Smoking status: Former Games developer  . Smokeless tobacco: Never Used  . Tobacco comment: quit smoling in 1990. started when 18, 1 ppd  Substance Use Topics  . Alcohol use: Yes    Comment: occasionaly   . Drug use: Not on file      Allergies   Ace inhibitors and Isosorb dinitrate-hydralazine   Review of Systems Review of Systems  Constitutional: Negative for fever.  HENT: Positive for dental problem and facial swelling. Negative for ear pain, sore throat and trouble swallowing.   Respiratory: Negative for shortness of breath and stridor.   Musculoskeletal: Negative for neck pain.  Skin: Negative for color change.  Neurological: Negative for headaches.     Physical Exam Updated Vital Signs BP (!) 143/99   Pulse (!) 58   Temp 98.7 F (37.1 C) (Oral)   Resp 16   SpO2 98%   Physical Exam Vitals signs and nursing note reviewed.  Constitutional:      Appearance: He is well-developed.  HENT:     Head: Normocephalic and atraumatic.     Jaw: No trismus.     Right Ear: Tympanic membrane, ear canal and external ear normal.     Left Ear: Tympanic membrane, ear canal and external ear normal.     Nose: Nose normal.     Mouth/Throat:     Dentition: Abnormal dentition. Dental caries present. No dental abscesses.     Pharynx: Uvula midline. No uvula swelling.     Tonsils: No tonsillar abscesses.     Comments: There is some erythema and edema of the gumline in the right lower anterior mandible.  No gross abscess.  Patient with multiple missing teeth with caries noted in existing teeth. Eyes:     Pupils: Pupils are equal, round, and reactive to light.  Neck:     Musculoskeletal: Normal range of motion and neck supple.     Comments: No neck swelling or Lugwig's angina Skin:    General: Skin is warm and dry.  Neurological:     Mental Status: He is alert.      ED Treatments / Results  Labs (all labs ordered are listed, but only abnormal results are displayed) Labs Reviewed - No data to display  EKG None  Radiology No results found.  Procedures Procedures (including critical care time)  Medications Ordered in ED Medications - No data to display   Initial Impression / Assessment and Plan /  ED Course  I have reviewed the triage vital signs and the nursing notes.  Pertinent labs & imaging results that were available during my care of the patient were reviewed by me and considered in my medical decision making (see chart for details).     3:43 PM Patient seen and examined.   Vital signs reviewed and are as follows: BP (!) 143/99   Pulse (!) 58   Temp 98.7 F (37.1 C) (Oral)   Resp 16   SpO2 98%  Will give tramadol for pain as patient has had this in the past.  No seizure history.  He is unable to take NSAIDs due to other medical conditions.  Patient to use Tylenol when he can.  Patient counseled on use of narcotic-like pain medications. Counseled not to combine these medications with others containing tylenol. Urged not to drink alcohol, drive, or perform any other activities that requires focus while taking these medications. The patient verbalizes understanding and agrees with the plan.  Patient counseled to take prescribed medications as directed, return with worsening facial or neck swelling, and to follow-up with their dentist as soon as possible.    Final Clinical Impressions(s) / ED Diagnoses   Final diagnoses:  Dental infection   Patient with toothache. No fever. Exam unconcerning for Ludwig's angina or other deep tissue infection in neck.   As there is gum swelling, erythema, and facial swelling, will treat with antibiotic and pain medicine. Urged patient to follow-up with dentist.     ED Discharge Orders         Ordered    amoxicillin (AMOXIL) 500 MG capsule  3 times daily     04/21/18 1540    traMADol (ULTRAM) 50 MG tablet  Every 12 hours PRN     04/21/18 1540           Renne Crigler, PA-C 04/21/18 1544    Melene Plan, DO 04/21/18 1625

## 2018-04-22 ENCOUNTER — Emergency Department (HOSPITAL_BASED_OUTPATIENT_CLINIC_OR_DEPARTMENT_OTHER): Payer: Medicare Other

## 2018-04-22 ENCOUNTER — Encounter (HOSPITAL_BASED_OUTPATIENT_CLINIC_OR_DEPARTMENT_OTHER): Payer: Self-pay | Admitting: Emergency Medicine

## 2018-04-22 ENCOUNTER — Other Ambulatory Visit: Payer: Self-pay

## 2018-04-22 ENCOUNTER — Emergency Department (HOSPITAL_BASED_OUTPATIENT_CLINIC_OR_DEPARTMENT_OTHER)
Admission: EM | Admit: 2018-04-22 | Discharge: 2018-04-22 | Disposition: A | Discharge status: Home / Self Care | Payer: Medicare Other | Attending: Emergency Medicine | Admitting: Emergency Medicine

## 2018-04-22 DIAGNOSIS — F329 Major depressive disorder, single episode, unspecified: Secondary | ICD-10-CM | POA: Insufficient documentation

## 2018-04-22 DIAGNOSIS — K047 Periapical abscess without sinus: Secondary | ICD-10-CM | POA: Diagnosis not present

## 2018-04-22 DIAGNOSIS — Z7982 Long term (current) use of aspirin: Secondary | ICD-10-CM | POA: Insufficient documentation

## 2018-04-22 DIAGNOSIS — Z79899 Other long term (current) drug therapy: Secondary | ICD-10-CM | POA: Insufficient documentation

## 2018-04-22 DIAGNOSIS — L03211 Cellulitis of face: Secondary | ICD-10-CM | POA: Diagnosis not present

## 2018-04-22 DIAGNOSIS — I5022 Chronic systolic (congestive) heart failure: Secondary | ICD-10-CM | POA: Diagnosis not present

## 2018-04-22 DIAGNOSIS — I110 Hypertensive heart disease with heart failure: Secondary | ICD-10-CM | POA: Diagnosis not present

## 2018-04-22 DIAGNOSIS — Z87891 Personal history of nicotine dependence: Secondary | ICD-10-CM | POA: Diagnosis not present

## 2018-04-22 DIAGNOSIS — J45909 Unspecified asthma, uncomplicated: Secondary | ICD-10-CM | POA: Diagnosis not present

## 2018-04-22 DIAGNOSIS — R6884 Jaw pain: Secondary | ICD-10-CM | POA: Diagnosis present

## 2018-04-22 LAB — BASIC METABOLIC PANEL
Anion gap: 8 (ref 5–15)
BUN: 18 mg/dL (ref 8–23)
CHLORIDE: 103 mmol/L (ref 98–111)
CO2: 24 mmol/L (ref 22–32)
Calcium: 10 mg/dL (ref 8.9–10.3)
Creatinine, Ser: 1.46 mg/dL — ABNORMAL HIGH (ref 0.61–1.24)
GFR calc Af Amer: 53 mL/min — ABNORMAL LOW (ref >60)
GFR calc non Af Amer: 46 mL/min — ABNORMAL LOW (ref >60)
GLUCOSE: 125 mg/dL — AB (ref 70–99)
Potassium: 4.1 mmol/L (ref 3.5–5.1)
SODIUM: 135 mmol/L (ref 135–145)

## 2018-04-22 LAB — CBC WITH DIFFERENTIAL/PLATELET
ABS IMMATURE GRANULOCYTES: 0.02 10*3/uL (ref 0.00–0.07)
Basophils Absolute: 0 10*3/uL (ref 0.0–0.1)
Basophils Relative: 0 %
EOS PCT: 2 %
Eosinophils Absolute: 0.2 10*3/uL (ref 0.0–0.5)
HCT: 45.7 % (ref 39.0–52.0)
HEMOGLOBIN: 14.6 g/dL (ref 13.0–17.0)
Immature Granulocytes: 0 %
LYMPHS PCT: 14 %
Lymphs Abs: 1.4 10*3/uL (ref 0.7–4.0)
MCH: 30.7 pg (ref 26.0–34.0)
MCHC: 31.9 g/dL (ref 30.0–36.0)
MCV: 96 fL (ref 80.0–100.0)
MONO ABS: 1 10*3/uL (ref 0.1–1.0)
Monocytes Relative: 10 %
NEUTROS ABS: 7.1 10*3/uL (ref 1.7–7.7)
Neutrophils Relative %: 74 %
Platelets: 247 10*3/uL (ref 150–400)
RBC: 4.76 MIL/uL (ref 4.22–5.81)
RDW: 12.9 % (ref 11.5–15.5)
WBC: 9.6 10*3/uL (ref 4.0–10.5)
nRBC: 0 % (ref 0.0–0.2)

## 2018-04-22 IMAGING — CT CT NECK W/ CM
4 of 5 series · 16 of 33 positions shown, 18 images · IV contrast (iopamidol)
Comparison: None.

CLINICAL DATA: Right jaw swelling for 3 days

EXAM:
CT NECK WITH CONTRAST
TECHNIQUE: Multidetector CT imaging of the neck was performed using the
standard protocol following the bolus administration of intravenous
contrast.
CONTRAST:  75mL [S2] IOPAMIDOL ([S2]) INJECTION 61%

[Series 3: axial neck · axial · 0.60mm/px · z∈[-254,-108]mm · 4 of 123 slices shown, 5 images]
[im 25/123  soft-tissue]
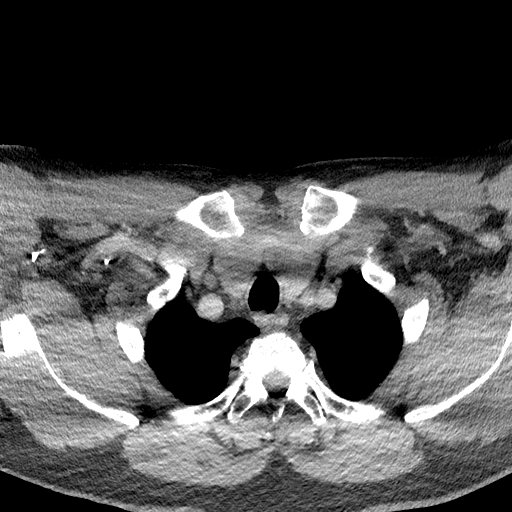
[im 25/123  bone]
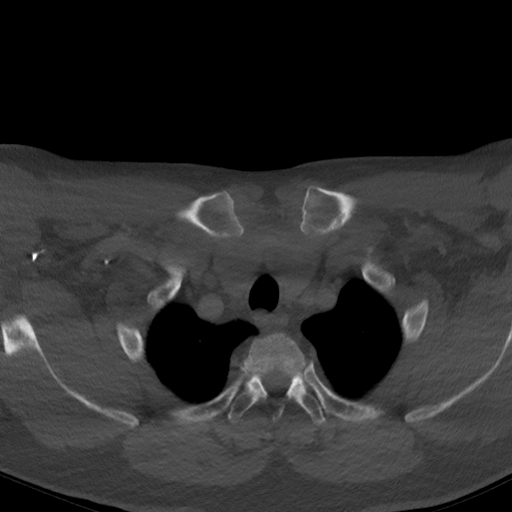
[im 49/123  bone]
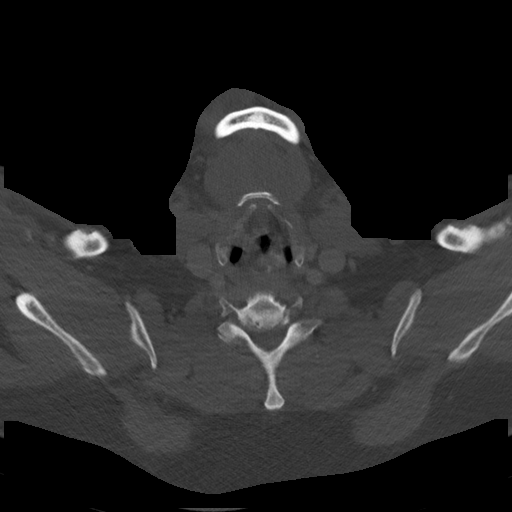
[im 74/123  bone]
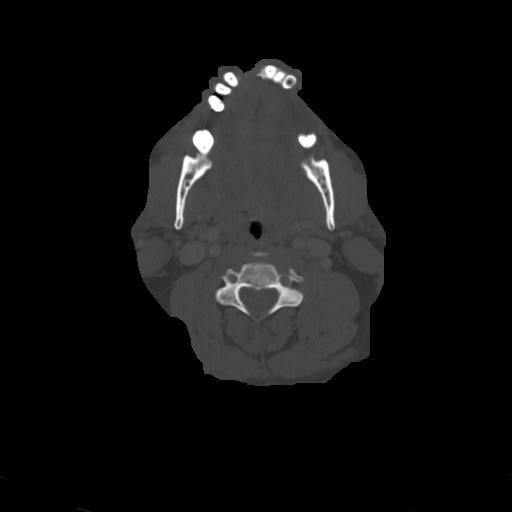
[im 98/123  bone]
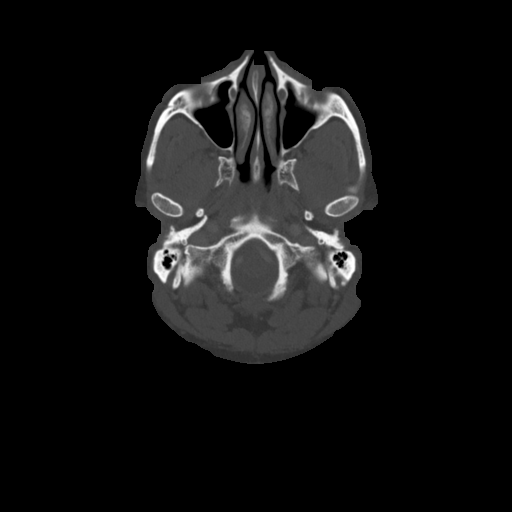

[Series 7: sag neck · sagittal · 0.59mm/px · 5 of 114 slices shown, 6 images]
[im 38/114  bone]
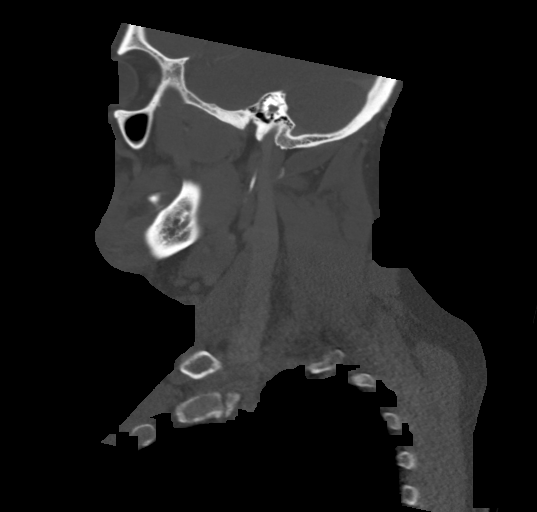
[im 48/114  bone]
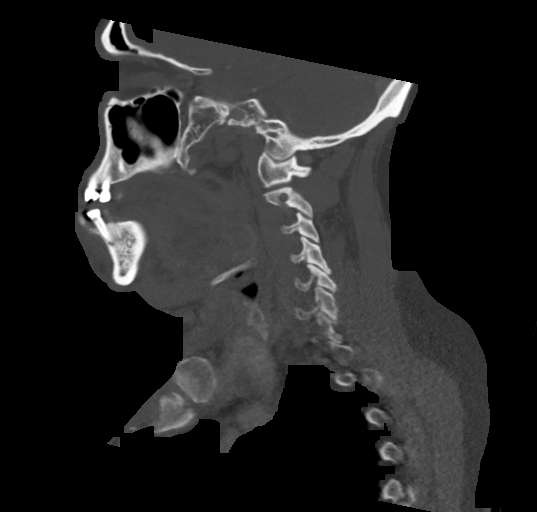
[im 57/114  soft-tissue]
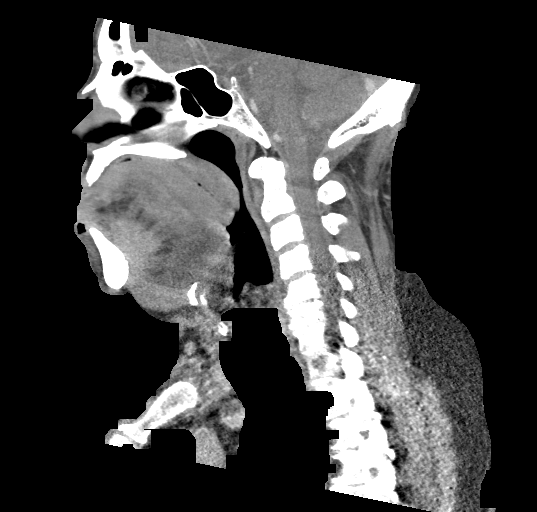
[im 57/114  bone]
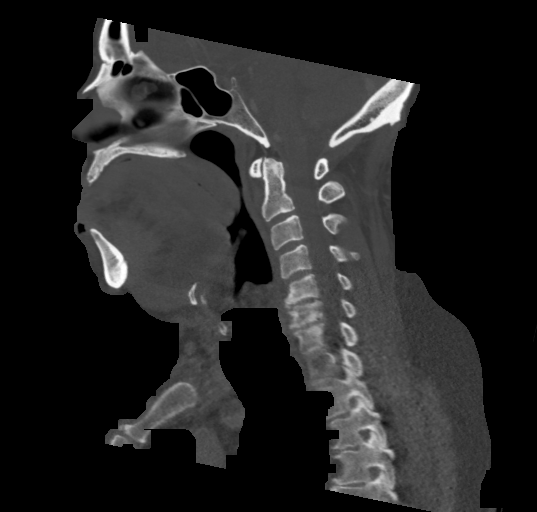
[im 66/114  bone]
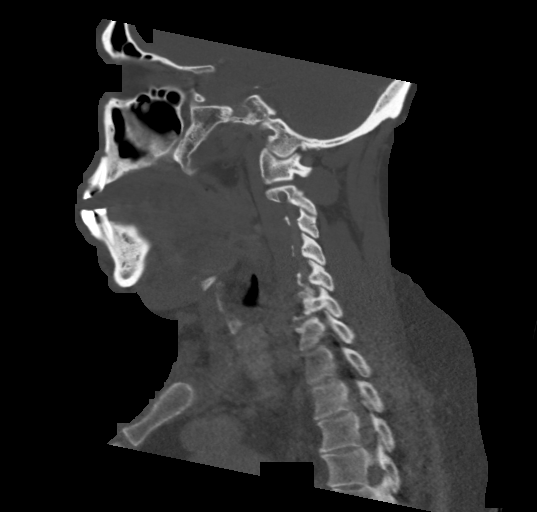
[im 76/114  bone]
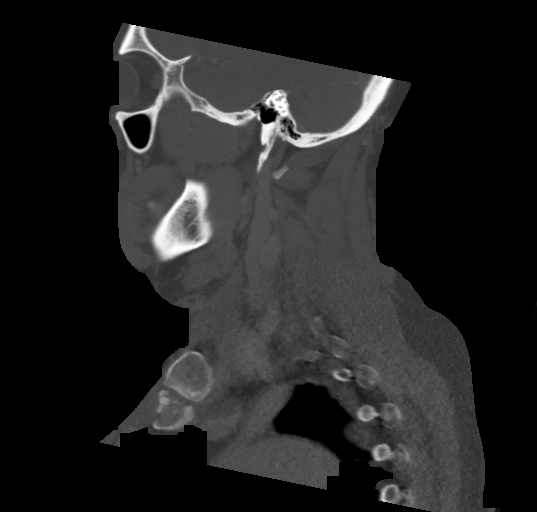

[Series 8: cor neck · coronal · 0.50mm/px · 3 of 115 slices shown]
[im 28/115  bone]
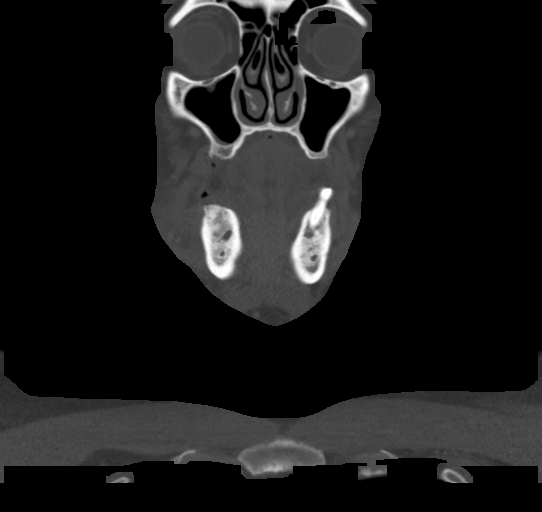
[im 48/115  bone]
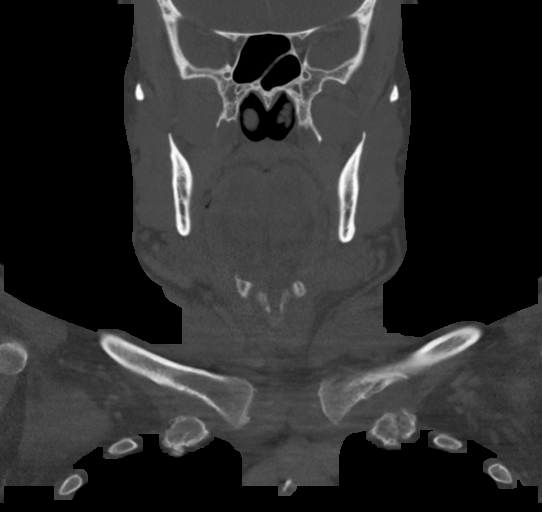
[im 68/115  bone]
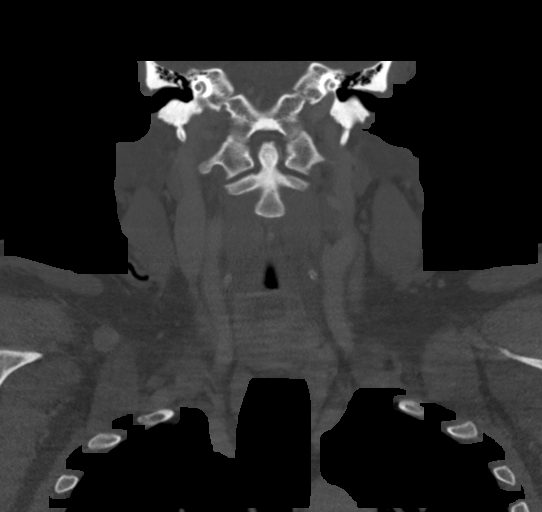

[Series 9: orthogonal ax · axial · 0.39mm/px · z∈[-288,-136]mm · 4 of 131 slices shown]
[im 27/131  bone]
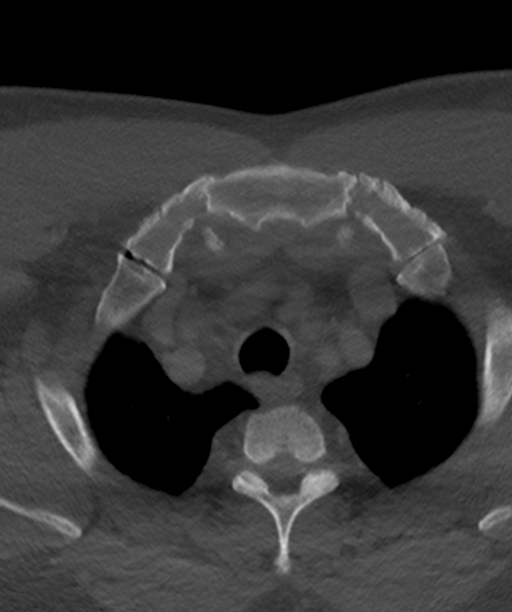
[im 53/131  bone]
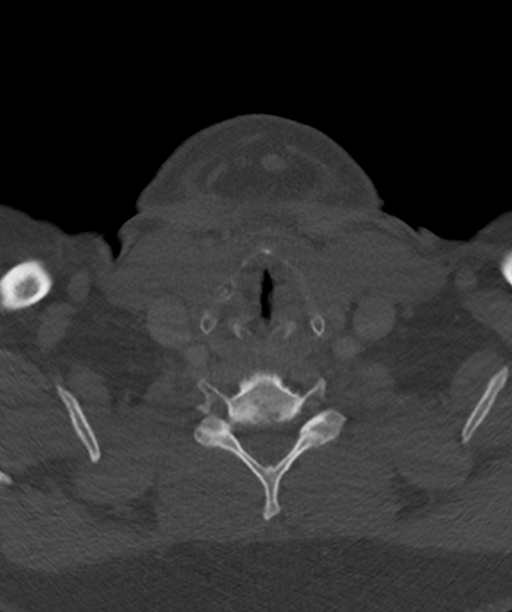
[im 79/131  bone]
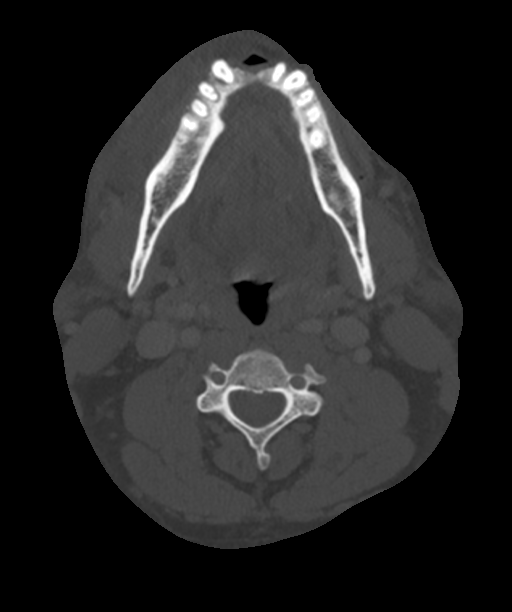
[im 105/131  bone]
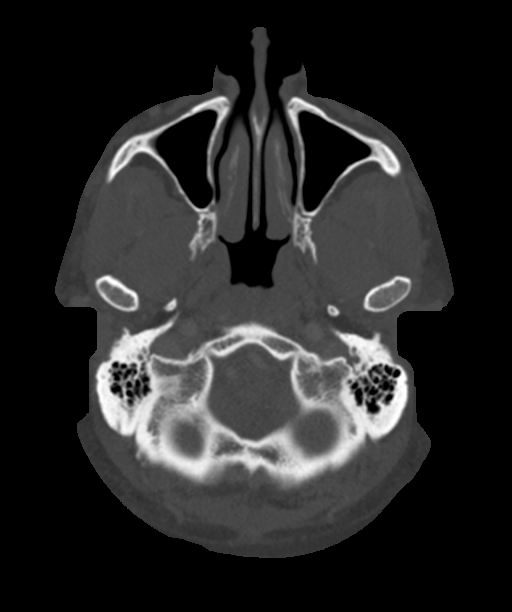

[16 of 33 positions shown; findings below may reference images not displayed]

FINDINGS: Pharynx and larynx: No evidence of mass or swelling.

Salivary glands: No inflammation, mass, or stone.

Thyroid: Mild goiter.

Lymph nodes: None enlarged or abnormal density.

Vascular: Negative

Limited intracranial: Negative.

Visualized orbits: Right cataract resection

Mastoids and visualized paranasal sinuses: Clear

Skeleton: Multiple eroded teeth with right lower canine and first
premolar periapical erosions that are notable given extensive
regional inflammation. No subperiosteal abscess is seen. Cervical
spine degeneration.

Upper chest: Negative
IMPRESSION: Cellulitis along the right jaw, likely odontogenic given there are
multiple eroded teeth and periapical erosions. No subperiosteal
abscess or floor of mouth inflammation swelling.

## 2018-04-22 MED ORDER — CLINDAMYCIN HCL 150 MG PO CAPS: 450.0000 mg | ORAL_CAPSULE | Freq: Three times a day (TID) | ORAL | 0 refills | Status: AC

## 2018-04-22 MED ORDER — CLINDAMYCIN HCL 150 MG PO CAPS
450.0000 mg | ORAL_CAPSULE | Freq: Once | ORAL | Status: AC
  Administered 2018-04-22: 450 mg via ORAL
  Filled 2018-04-22: qty 3

## 2018-04-22 MED ORDER — IOPAMIDOL (ISOVUE-300) INJECTION 61%
100.0000 mL | Freq: Once | INTRAVENOUS | Status: AC | PRN
  Administered 2018-04-22: 75 mL via INTRAVENOUS

## 2018-04-22 MED ORDER — HYDROCODONE-ACETAMINOPHEN 5-325 MG PO TABS
1.0000 | ORAL_TABLET | Freq: Once | ORAL | Status: AC
  Administered 2018-04-22: 1 via ORAL
  Filled 2018-04-22: qty 1

## 2018-04-22 NOTE — ED Provider Notes (Signed)
[facility] EMERGENCY DEPARTMENT Provider Note   CSN: [id number] Arrival date & time: 04/22/18  1048     History   Chief Complaint Chief Complaint  Patient presents with  . Dental Pain    HPI Brandon [name] is a 78 y.o. male.  HPI  78 year old male presents with right jaw swelling and pain.  Started a few days ago.  Has dental pain associated with this.  Was seen here yesterday and prescribed Ultram and amoxicillin.  He has been taking these but the pain is worsening and now he feels like the facial swelling is significantly worse.  No trouble breathing or swallowing.  No fevers.  Nothing has drained.  Past Medical History:  Diagnosis Date  . Asthma    Hx of childhood asthma, disappeared for a while, then resurfaced 6-7 years ago.   . Cardiomyopathy    with a negative cardiac catheterization in the past. (EF appriximately 40-45%)   . HTN (hypertension)    x 30 years  . Obesity, unspecified   . Sleep apnea    CPAP  . 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 Bilateral shoulder pain 08/24/2016  . Depression 12/09/2015  . Pain and swelling of knee 06/14/2014  . Edema 12/06/2011  . Asthma 08/13/2009  . Hyperglycemia 08/13/2009  . Obstructive sleep apnea 01/23/2009  . 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 OBESITY, UNSPECIFIED 08/26/2008  . 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 Essential hypertension 06/28/2008  . 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 None          Home Medications    Prior to Admission medications   Medication Sig Start Date End Date Taking? Authorizing Provider  albuterol (PROAIR HFA) 108 (90 Base) MCG/ACT inhaler Inhale 2 puffs into the lungs every 6 (six) hours as needed for wheezing or shortness of breath. 04/22/17   [name], NP  amLODipine (NORVASC)  10 MG tablet Take 1 tablet (10 mg total) by mouth daily. 12/14/17   [name], NP  aspirin 81 MG tablet Take 81 mg by mouth daily.      [provider]  clindamycin (CLEOCIN) 150 MG capsule Take 3 capsules (450 mg total) by mouth 3 (three) times daily for 7 days. 04/22/18 04/29/18  Pricilla Loveless, MD  Fluticasone-Salmeterol (ADVAIR DISKUS) 500-50 MCG/DOSE AEPB Inhale 1 puff into the lungs 2 (two) times daily. 11/30/17   [name], NP  losartan (COZAAR) 50 MG tablet Take 1 tablet (50 mg total) by mouth 2 (two) times daily. 09/16/17   [name], NP  metoprolol succinate (TOPROL-XL) 25 MG 24 hr tablet Take 1 tablet (25 mg total) by mouth daily. 12/14/17   [name], NP  montelukast (SINGULAIR) 10 MG tablet Take 1 tablet (10 mg total) by mouth at bedtime. 12/14/17   [name], NP  predniSONE (DELTASONE) 10 MG tablet 4 tabs by mouth once daily for 2 days, then 3 tabs daily x 2 days, then 2 tabs daily x 2 days, then 1 tab daily x 2 days 12/14/17   [name], NP  sertraline (ZOLOFT) 50 MG tablet Take 1 tablet (50 mg total) by mouth daily. 12/14/17   [name], NP  spironolactone (ALDACTONE) 25 MG tablet Take 1 tablet (25 mg total) by mouth daily. 12/14/17   [name], NP  traMADol Janean Sark)  50 MG tablet Take 1 tablet (50 mg total) by mouth every 12 (twelve) hours as needed. 04/21/18   Renne Crigler, PA-C    Family History Family History  Problem Relation Age of Onset  . Cancer Father        multiple melanoma  . Lupus Sister   . Asthma Daughter     Social History Social History   Tobacco Use  . Smoking status: Former Games developer  . Smokeless tobacco: Never Used  . Tobacco comment: quit smoling in 1990. started when 18, 1 ppd  Substance Use Topics  . Alcohol use: Yes    Comment: occasionaly   . Drug use: Not on file     Allergies   Ace inhibitors and Isosorb dinitrate-hydralazine   Review of Systems Review of  Systems  Constitutional: Negative for fever.  HENT: Positive for dental problem and facial swelling. Negative for trouble swallowing.   Respiratory: Negative for shortness of breath.   Gastrointestinal: Negative for vomiting.  All other systems reviewed and are negative.    Physical Exam Updated Vital Signs BP (!) 152/94 (BP Location: Left Arm)   Pulse 66   Temp 99.2 F (37.3 C) (Oral)   Resp 18   Ht 6' (1.829 m)   Wt 115.7 kg   SpO2 99%   BMI 34.58 kg/m   Physical Exam Vitals signs and nursing note reviewed.  Constitutional:      Appearance: He is well-developed.  HENT:     Head: Normocephalic and atraumatic.      Right Ear: External ear normal.     Left Ear: External ear normal.     Nose: Nose normal.     Mouth/Throat:     Dentition: Abnormal dentition.      Comments: There is diffuse swelling without focal fluctuance or abscess to the inner mandibular cheek. No obvious drainable abscess. Eyes:     General:        Right eye: No discharge.        Left eye: No discharge.  Neck:     Musculoskeletal: Neck supple.  Cardiovascular:     Rate and Rhythm: Normal rate and regular rhythm.     Heart sounds: Normal heart sounds.  Pulmonary:     Effort: Pulmonary effort is normal.     Breath sounds: Normal breath sounds.  Abdominal:     Palpations: Abdomen is soft.     Tenderness: There is no abdominal tenderness.  Skin:    General: Skin is warm and dry.  Neurological:     Mental Status: He is alert.  Psychiatric:        Mood and Affect: Mood is not anxious.      ED Treatments / Results  Labs (all labs ordered are listed, but only abnormal results are displayed) Labs Reviewed  BASIC METABOLIC PANEL - Abnormal; Notable for the following components:      Result Value   Glucose, Bld 125 (*)    Creatinine, Ser 1.46 (*)    GFR calc non Af Amer 46 (*)    GFR calc Af Amer 53 (*)    All other components within normal limits  CBC WITH DIFFERENTIAL/PLATELET     EKG None  Radiology Ct Soft Tissue Neck W Contrast  Result Date: 04/22/2018 CLINICAL DATA:  Right jaw swelling for 3 days EXAM: CT NECK WITH CONTRAST TECHNIQUE: Multidetector CT imaging of the neck was performed using the standard protocol following the bolus administration of intravenous contrast. CONTRAST:  39mL ISOVUE-300 IOPAMIDOL (ISOVUE-300) INJECTION 61% COMPARISON:  None. FINDINGS: Pharynx and larynx: No evidence of mass or swelling. Salivary glands: No inflammation, mass, or stone. Thyroid: Mild goiter. Lymph nodes: None enlarged or abnormal density. Vascular: Negative Limited intracranial: Negative. Visualized orbits: Right cataract resection Mastoids and visualized paranasal sinuses: Clear Skeleton: Multiple eroded teeth with right lower canine and first premolar periapical erosions that are notable given extensive regional inflammation. No subperiosteal abscess is seen. Cervical spine degeneration. Upper chest: Negative IMPRESSION: Cellulitis along the right jaw, likely odontogenic given there are multiple eroded teeth and periapical erosions. No subperiosteal abscess or floor of mouth inflammation swelling. Electronically Signed   By: Marnee Spring M.D.   On: 04/22/2018 13:10    Procedures Procedures (including critical care time)  Medications Ordered in ED Medications  iopamidol (ISOVUE-300) 61 % injection 100 mL (75 mLs Intravenous Contrast Given 04/22/18 1248)  clindamycin (CLEOCIN) capsule 450 mg (450 mg Oral Given 04/22/18 1347)  HYDROcodone-acetaminophen (NORCO/VICODIN) 5-325 MG per tablet 1 tablet (1 tablet Oral Given 04/22/18 1347)     Initial Impression / Assessment and Plan / ED Course  I have reviewed the triage vital signs and the nursing notes.  Pertinent labs & imaging results that were available during my care of the patient were reviewed by me and considered in my medical decision making (see chart for details).     Given the degree of swelling that has taken  place since yesterday, CT obtained.  There is no obvious drainable fluid collection.  Airway is stable and this all appears to be external to his mandible.  He will be broadened to clindamycin and advised to stop the amoxicillin.  He is encouraged to follow-up very closely with his dentist who he states he can see this upcoming week.  We discussed return precautions.  Final Clinical Impressions(s) / ED Diagnoses   Final diagnoses:  Dental infection  Facial cellulitis    ED Discharge Orders         Ordered    clindamycin (CLEOCIN) 150 MG capsule  3 times daily     04/22/18 1323           Pricilla Loveless, MD 04/22/18 1439

## 2018-04-22 NOTE — Discharge Instructions (Signed)
Stop the amoxicillin.  You will now take clindamycin instead.  It is important to follow-up as soon as possible in the next 2-3 days with your dentist.  If you develop fever, vomiting, trouble breathing or swallowing, or any other new/concerning symptoms then return to the ER for evaluation.

## 2018-04-22 NOTE — ED Notes (Signed)
Patient transported to CT 

## 2018-04-22 NOTE — ED Triage Notes (Signed)
R lower dental pain for several days. Seen here yesterday and states the pain is worse.

## 2018-04-30 ENCOUNTER — Other Ambulatory Visit: Payer: Self-pay | Admitting: Family

## 2018-06-08 ENCOUNTER — Other Ambulatory Visit: Payer: Self-pay | Admitting: Family

## 2018-06-10 ENCOUNTER — Other Ambulatory Visit: Payer: Self-pay | Admitting: Family

## 2018-06-12 ENCOUNTER — Other Ambulatory Visit: Payer: Self-pay | Admitting: Family

## 2018-06-13 NOTE — Telephone Encounter (Signed)
Please left vm requesting this medication to be refilled but it looks like it was already sent to the pharmacy. Can someone reach out to the patient

## 2018-06-21 ENCOUNTER — Other Ambulatory Visit: Payer: Self-pay | Admitting: Family

## 2018-07-12 ENCOUNTER — Other Ambulatory Visit: Payer: Self-pay | Admitting: Family

## 2018-07-13 ENCOUNTER — Telehealth: Payer: Self-pay | Admitting: Family

## 2018-07-13 NOTE — Telephone Encounter (Signed)
Refill sent but pt is overdue for follow up. Please contact pt to schedule OV.

## 2018-07-17 NOTE — Telephone Encounter (Signed)
Was scheduled for tomorrow afternoon

## 2018-07-18 ENCOUNTER — Ambulatory Visit (INDEPENDENT_AMBULATORY_CARE_PROVIDER_SITE_OTHER): Payer: Medicare Other | Admitting: Family

## 2018-07-18 ENCOUNTER — Other Ambulatory Visit: Payer: Self-pay

## 2018-07-18 DIAGNOSIS — F32A Depression, unspecified: Secondary | ICD-10-CM

## 2018-07-18 DIAGNOSIS — F329 Major depressive disorder, single episode, unspecified: Secondary | ICD-10-CM | POA: Diagnosis not present

## 2018-07-18 DIAGNOSIS — I5022 Chronic systolic (congestive) heart failure: Secondary | ICD-10-CM

## 2018-07-18 DIAGNOSIS — J45909 Unspecified asthma, uncomplicated: Secondary | ICD-10-CM

## 2018-07-18 DIAGNOSIS — K047 Periapical abscess without sinus: Secondary | ICD-10-CM | POA: Diagnosis not present

## 2018-07-18 DIAGNOSIS — I10 Essential (primary) hypertension: Secondary | ICD-10-CM

## 2018-07-18 DIAGNOSIS — R739 Hyperglycemia, unspecified: Secondary | ICD-10-CM | POA: Diagnosis not present

## 2018-07-18 DIAGNOSIS — G4733 Obstructive sleep apnea (adult) (pediatric): Secondary | ICD-10-CM

## 2018-07-18 MED ORDER — CLINDAMYCIN HCL 300 MG PO CAPS: 300.0000 mg | ORAL_CAPSULE | Freq: Three times a day (TID) | ORAL | 0 refills | Status: DC

## 2018-07-18 NOTE — Progress Notes (Signed)
Virtual Visit via Video Note  I connected with Brandon [name] on 07/18/18 at  3:20 PM EDT by a video enabled telemedicine application and verified that I am speaking with the correct person using two identifiers. This visit type was conducted due to national recommendations for restrictions regarding the COVID-19 Pandemic (e.g. social distancing).  This format is felt to be most appropriate for this patient at this time.   I discussed the limitations of evaluation and management by telemedicine and the availability of in person appointments. The patient expressed understanding and agreed to proceed.  Only the patient and myself were on today's video visit. The patient was at home and I was in my office at the time of today's visit.   History of Present Illness:  Patient is a 78 yr old male who presents today for follow up.  HTN- bp meds include amlodipine, losartan, metoprolol, aldactone. Reports his BP cuff is not working.   BP Readings from Last 3 Encounters:  04/22/18 (!) 152/94  04/21/18 (!) 143/99  12/14/17 129/75   Asthma- on singulair, advair and prn albuterol.  Reports that he tries to stay inside as much as possible. Reports that he uses albuterol before exercise.    Depression- maintained on zoloft. Reports that his mood has been good.   OSA- reports that he continues cpap without any issues.  He reports that he    Chronic systolic heart failure- reports some LE edema at the end of the day. Reports that he will elevate his feet which helps. He has not weighed himself recently.  Denies SOB other than the sob that he experiences from his asthma.   Reports that his dental extraction has been postponed. Left top tooth ("in the middle") has increased gum swelling and tenderness. Hoping to postpone a bit more until COVID calms down.   Observations/Objective:   Gen: Awake, alert, no acute distress Resp: Breathing is even and non-labored Psych: calm/pleasant demeanor Neuro: Alert  and Oriented x 3, + facial symmetry, speech is clear.    Assessment and Plan:  HTN- clinically stable. Plan to continue current meds and have him RTC in 3 months for BP check and lab work.  Depression- stable on zoloft. Continue same.  Asthma- slight increase in symptoms due to allergies/pollen, however he reports that he is managing with his current meds. Continue same- monitor.  Dental abscess- rx with clindamycin. He is advised to call dentist sooner if increased pain/swelling. He verbalizes understanding.  CHF- appears clinically stable. Monitor.   OSA- stable on CPAP, good compliance. Continue same.   Hyperglycemia- discussed diet. Will need A1C next visit at his face to face.  Lab Results  Component Value Date   HGBA1C 6.0 12/20/2017     Follow Up Instructions:    I discussed the assessment and treatment plan with the patient. The patient was provided an opportunity to ask questions and all were answered. The patient agreed with the plan and demonstrated an understanding of the instructions.   The patient was advised to call back or seek an in-person evaluation if the symptoms worsen or if the condition fails to improve as anticipated.    [name], NP

## 2018-07-19 ENCOUNTER — Telehealth: Payer: Self-pay | Admitting: Family

## 2018-07-19 NOTE — Telephone Encounter (Signed)
LVM for pt to schedule (Return in about 3 months (around 10/18/2018) for follow up visit) Pt had vov and is needing 3 month fu appt.

## 2018-07-31 ENCOUNTER — Other Ambulatory Visit: Payer: Self-pay | Admitting: Family

## 2018-08-02 ENCOUNTER — Other Ambulatory Visit: Payer: Self-pay | Admitting: Family

## 2018-08-04 MED ORDER — LOSARTAN POTASSIUM 50 MG PO TABS: 50.0000 mg | ORAL_TABLET | Freq: Two times a day (BID) | ORAL | 1 refills | Status: DC

## 2018-08-04 NOTE — Telephone Encounter (Signed)
Pt would like call back as to why refill was declined [phone]

## 2018-08-04 NOTE — Telephone Encounter (Signed)
Called mobile phone, no answer.  Called home phone

## 2018-08-04 NOTE — Telephone Encounter (Signed)
Spoke with pt. Advised him that losartan rx has been sent to his pharmacy.

## 2018-08-04 NOTE — Telephone Encounter (Signed)
Pt called in again and stated he has not heard back from anyone regarding his message about the Rx being denied. Pt requests call back.

## 2018-08-08 ENCOUNTER — Telehealth: Payer: Self-pay | Admitting: Cardiology

## 2018-08-08 NOTE — Telephone Encounter (Signed)
LVM for pt to call and schedule 1 yr followup with Dr. Antoine Poche.

## 2018-09-07 ENCOUNTER — Other Ambulatory Visit: Payer: Self-pay | Admitting: Family

## 2018-09-11 ENCOUNTER — Telehealth: Payer: Self-pay | Admitting: *Deleted

## 2018-09-11 NOTE — Telephone Encounter (Signed)
Unable to leave a message, no voicemail.  

## 2018-10-01 NOTE — Progress Notes (Signed)
HPI The patient presents for followup of known cardiomyopathy. Since I last saw him he has done well. The patient denies any new symptoms such as chest discomfort, neck or arm discomfort. There has been no new shortness of breath, PND or orthopnea. There have been no reported palpitations, presyncope or syncope.  He has not been as active as I would like.  He is trying to stay away from coronavirus.   Allergies  Allergen Reactions  . Ace Inhibitors     REACTION: cough  . Isosorb Dinitrate-Hydralazine     REACTION: dizziness/hypotensive    Current Outpatient Medications  Medication Sig Dispense Refill  . amLODipine (NORVASC) 10 MG tablet Take 1 tablet by mouth once daily 90 tablet 0  . aspirin 81 MG tablet Take 81 mg by mouth daily.      . clindamycin (CLEOCIN) 300 MG capsule Take 1 capsule (300 mg total) by mouth 3 (three) times daily. 30 capsule 0  . Fluticasone-Salmeterol (ADVAIR) 500-50 MCG/DOSE AEPB INHALE 1 DOSE BY MOUTH TWICE DAILY 60 each 5  . losartan (COZAAR) 50 MG tablet Take 1 tablet (50 mg total) by mouth 2 (two) times daily. 180 tablet 1  . metoprolol succinate (TOPROL-XL) 25 MG 24 hr tablet Take 1 tablet (25 mg total) by mouth daily. 90 tablet 1  . montelukast (SINGULAIR) 10 MG tablet TAKE 1 TABLET BY MOUTH AT BEDTIME 90 tablet 0  . predniSONE (DELTASONE) 10 MG tablet 4 tabs by mouth once daily for 2 days, then 3 tabs daily x 2 days, then 2 tabs daily x 2 days, then 1 tab daily x 2 days 20 tablet 0  . PROAIR HFA 108 (90 Base) MCG/ACT inhaler INHALE 2 PUFFS BY MOUTH EVERY 6 HOURS AS NEEDED FOR WHEEZING AND FOR SHORTNESS OF BREATH 9 g 0  . sertraline (ZOLOFT) 50 MG tablet Take 1 tablet (50 mg total) by mouth daily. 90 tablet 1  . spironolactone (ALDACTONE) 25 MG tablet Take 1 tablet by mouth once daily 90 tablet 0  . traMADol (ULTRAM) 50 MG tablet Take 1 tablet (50 mg total) by mouth every 12 (twelve) hours as needed. 8 tablet 0   No current facility-administered  medications for this visit.     Past Medical History:  Diagnosis Date  . Asthma    Hx of childhood asthma, disappeared for a while, then resurfaced 6-7 years ago.   . Cardiomyopathy    with a negative cardiac catheterization in the past. (EF appriximately 40-45%)   . HTN (hypertension)    x 30 years  . Obesity, unspecified   . Sleep apnea    CPAP  . 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 None      ROS:  As stated in the HPI and negative for all other systems.  PHYSICAL EXAM BP (!) 144/70   Pulse 64   Temp (!) 96.8 F (36 C)   Ht 6' (1.829 m)   Wt 259 lb 12.8 oz (117.8 kg)   SpO2 94%   BMI 35.24 kg/m   GENERAL:  Well appearing NECK:  No jugular venous distention, waveform within normal limits, carotid upstroke brisk and symmetric, no bruits, no thyromegaly LUNGS:  Clear to auscultation bilaterally CHEST:  Unremarkable HEART:  PMI not displaced or sustained,S1 and S2 within normal limits, no S3, no S4, no clicks, no rubs, no murmurs ABD:  Flat, positive bowel  sounds normal in frequency in pitch, no bruits, no rebound, no guarding, no midline pulsatile mass, no hepatomegaly, no splenomegaly EXT:  2 plus pulses throughout, no edema, no cyanosis no clubbing   EKG:   Sinus rhythm, rate 63, axis within normal limits, RBBB (new since previous.) no acute ST-T wave changes.   10/03/2018    ASSESSMENT AND PLAN   CARDIOMYOPATHY -  EF was 45% in 2018.Marland Kitchen He has no new symptoms.  No change in therapy or further imaging is indicated.    HYPERTENSION -  BP is slightly elevated.  I would like him to lose a couple of pounds and otherwise stay on meds as listed.   OBSTRUCTIVE SLEEP APNEA -  He wears his CPAP regularly.  No change in therapy.   OBESITY, UNSPECIFIED -  We talked again about weight loss and increased exercise.  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  However, he has no symptoms suggestive of  new structural heart disease.  No change in therapy.

## 2018-10-02 ENCOUNTER — Telehealth: Payer: Self-pay

## 2018-10-02 NOTE — Telephone Encounter (Signed)
[facility] [address]  [id number] Pre-Screening Questions:  . In the past 7 to 10 days have you had a cough,  shortness of breath, headache, congestion, fever (100 or greater) body aches, chills, sore throat, or sudden loss of taste or sense of smell? NO-ASTHMA SX . Have you been around anyone with known Covid 19. NO . Have you been around anyone who is awaiting Covid 19 test results in the past 7 to 10 days? NO . Have you been around anyone who has been exposed to Covid 19, or has mentioned symptoms of Covid 19 within the past 7 to 10 days? NO  PT WILL ARRIVE EARLY W/MASK AND NO VISITORS

## 2018-10-03 ENCOUNTER — Encounter: Payer: Self-pay | Admitting: Cardiology

## 2018-10-03 ENCOUNTER — Other Ambulatory Visit: Payer: Self-pay

## 2018-10-03 ENCOUNTER — Ambulatory Visit (INDEPENDENT_AMBULATORY_CARE_PROVIDER_SITE_OTHER): Payer: Medicare Other | Admitting: Cardiology

## 2018-10-03 VITALS — BP 144/70 | HR 64 | Temp 96.8°F | Ht 72.0 in | Wt 259.8 lb

## 2018-10-03 DIAGNOSIS — I428 Other cardiomyopathies: Secondary | ICD-10-CM | POA: Diagnosis not present

## 2018-10-03 DIAGNOSIS — I10 Essential (primary) hypertension: Secondary | ICD-10-CM | POA: Diagnosis not present

## 2018-10-03 DIAGNOSIS — I5022 Chronic systolic (congestive) heart failure: Secondary | ICD-10-CM | POA: Diagnosis not present

## 2018-10-03 DIAGNOSIS — I4510 Unspecified right bundle-branch block: Secondary | ICD-10-CM | POA: Diagnosis not present

## 2018-10-03 NOTE — Patient Instructions (Addendum)
Follow-Up: You will need a follow up appointment in 12 months.  Please call our office 2 months in advance, MAY 2021 to schedule this, July 2021 appointment.  You may see James Hochrein, MD or one of the following Advanced Practice Providers on your designated Care Team:   Rhonda Barrett, PA-C Kathryn Lawrence, DNP, ANP       Medication Instructions:  The current medical regimen is effective;  continue present plan and medications as directed. Please refer to the Current Medication list given to you today. If you need a refill on your cardiac medications before your next appointment, please call your pharmacy. Labwork: When you have labs (blood work) and your tests are completely normal, you will receive your results ONLY by MyChart Message (if you have MyChart) -OR- A paper copy in the mail.  At [facility], you and your health needs are our priority.  As part of our continuing mission to provide you with exceptional heart care, we have created designated Provider Care Teams.  These Care Teams include your primary Cardiologist (physician) and Advanced Practice Providers (APPs -  Physician Assistants and Nurse Practitioners) who all work together to provide you with the care you need, when you need it.  Thank you for choosing [facility]!!       

## 2018-10-23 ENCOUNTER — Other Ambulatory Visit: Payer: Self-pay | Admitting: Family

## 2018-11-07 ENCOUNTER — Encounter: Payer: Self-pay | Admitting: Family

## 2018-11-07 ENCOUNTER — Other Ambulatory Visit: Payer: Self-pay

## 2018-11-07 ENCOUNTER — Ambulatory Visit (INDEPENDENT_AMBULATORY_CARE_PROVIDER_SITE_OTHER): Payer: Medicare Other | Admitting: Family

## 2018-11-07 VITALS — BP 120/71 | HR 84 | Temp 97.3°F | Resp 16 | Ht 72.0 in | Wt 258.0 lb

## 2018-11-07 DIAGNOSIS — M5412 Radiculopathy, cervical region: Secondary | ICD-10-CM

## 2018-11-07 DIAGNOSIS — I10 Essential (primary) hypertension: Secondary | ICD-10-CM

## 2018-11-07 DIAGNOSIS — M5416 Radiculopathy, lumbar region: Secondary | ICD-10-CM

## 2018-11-07 DIAGNOSIS — R739 Hyperglycemia, unspecified: Secondary | ICD-10-CM

## 2018-11-07 LAB — COMPREHENSIVE METABOLIC PANEL
ALT: 10 U/L (ref 0–53)
AST: 9 U/L (ref 0–37)
Albumin: 3.6 g/dL (ref 3.5–5.2)
Alkaline Phosphatase: 60 U/L (ref 39–117)
BUN: 22 mg/dL (ref 6–23)
CO2: 25 mEq/L (ref 19–32)
Calcium: 9.4 mg/dL (ref 8.4–10.5)
Chloride: 103 mEq/L (ref 96–112)
Creatinine, Ser: 1.37 mg/dL (ref 0.40–1.50)
GFR: 60.83 mL/min (ref >60.00)
Glucose, Bld: 114 mg/dL — ABNORMAL HIGH (ref 70–99)
Potassium: 4.3 mEq/L (ref 3.5–5.1)
Sodium: 137 mEq/L (ref 135–145)
Total Bilirubin: 0.5 mg/dL (ref 0.2–1.2)
Total Protein: 6.9 g/dL (ref 6.0–8.3)

## 2018-11-07 LAB — HEMOGLOBIN A1C: Hgb A1c MFr Bld: 6.3 % (ref 4.6–6.5)

## 2018-11-07 MED ORDER — AMLODIPINE BESYLATE 10 MG PO TABS: 10.0000 mg | ORAL_TABLET | Freq: Every day | ORAL | 1 refills | Status: DC

## 2018-11-07 MED ORDER — MONTELUKAST SODIUM 10 MG PO TABS: 10.0000 mg | ORAL_TABLET | Freq: Every day | ORAL | 1 refills | Status: DC

## 2018-11-07 MED ORDER — METOPROLOL SUCCINATE ER 25 MG PO TB24: 25.0000 mg | ORAL_TABLET | Freq: Every day | ORAL | 1 refills | Status: DC

## 2018-11-07 MED ORDER — SERTRALINE HCL 25 MG PO TABS: ORAL_TABLET | ORAL | 0 refills | Status: DC

## 2018-11-07 MED ORDER — SPIRONOLACTONE 25 MG PO TABS: 25.0000 mg | ORAL_TABLET | Freq: Every day | ORAL | 1 refills | Status: DC

## 2018-11-07 MED ORDER — METHYLPREDNISOLONE 4 MG PO TABS: 4.0000 mg | ORAL_TABLET | Freq: Every day | ORAL | 0 refills | Status: DC

## 2018-11-07 MED ORDER — PROAIR HFA 108 (90 BASE) MCG/ACT IN AERS: INHALATION_SPRAY | RESPIRATORY_TRACT | 3 refills | Status: DC

## 2018-11-07 NOTE — Patient Instructions (Signed)
Please complete lab work prior to leaving. Decrease zoloft to 25mg  once daily for 2 weeks,  Then every other day for 2 weeks, then stop. Call if you have mood issues with this taper. Begin medrol dose pain for neck and back pain. Call if your symptoms worsen or if not improved in 1 week.

## 2018-11-07 NOTE — Progress Notes (Signed)
Subjective:    Patient ID: Brandon [name], male    DOB: 06/01/1940, 78 y.o.   MRN: [id number]  HPI   Patient is a 78 yr old male who presents today with several concerns.    Pt reports that he developed bilateral shoulder pain for several weeks. Reports that pain comes down both arms.  Has some shoulder pain with lifting his shoulders.  Reports low back pain, radiates around and down both anterior thighs. Standing up from a seated position is "a chore."   He has tried CBD rub, arthritic alcohol, tylenol arthritis. Had some mild improvement initially with tylenol    HTN- he continues losartan, aldactone, amlodipine. Reports occasional mild LE edema.   reports mood is good on zoloft. Denies anxiety.  He was originally treated with Zoloft due to premature ejaculation.  He denies any significant depression or anxiety history.  He is interested in trying to come off of Zoloft.   BP Readings from Last 3 Encounters:  11/07/18 120/71  10/03/18 (!) 144/70  04/22/18 (!) 152/94   Asthma- continues advair, singulair.  Rarely needing albuterol.   Lab Results  Component Value Date   HGBA1C 6.0 12/20/2017   HGBA1C 6.2 08/23/2016   HGBA1C 5.7 12/09/2015   Lab Results  Component Value Date   LDLCALC 86 12/09/2015   CREATININE 1.46 (H) 04/22/2018    Having trouble walking in from the parking lot.     Review of Systems    see HPI  Past Medical History:  Diagnosis Date  . Asthma    Hx of childhood asthma, disappeared for a while, then resurfaced 6-7 years ago.   . Cardiomyopathy    with a negative cardiac catheterization in the past. (EF appriximately 40-45%)   . HTN (hypertension)    x 30 years  . Obesity, unspecified   . Sleep apnea    CPAP  . 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 Marital status: Married    Spouse name: [name]  . Number of children: 3  . Years of education: Not on file  . Highest  education level: Not on file  Occupational History  . Occupation: retired  Engineer, production  . Financial resource strain: Not on file  . Food insecurity    Worry: Not on file    Inability: Not on file  . Transportation needs    Medical: Not on file    Non-medical: Not on file  Tobacco Use  . Smoking status: Former Games developer  . Smokeless tobacco: Never Used  . Tobacco comment: quit smoling in 1990. started when 18, 1 ppd  Substance and Sexual Activity  . Alcohol use: Yes    Comment: occasionaly   . Drug use: Not on file  . Sexual activity: Not on file  Lifestyle  . Physical activity    Days per week: Not on file    Minutes per session: Not on file  . Stress: Not on file  Relationships  . Social Musician on phone: Not on file    Gets together: Not on file    Attends religious service: Not on file    Active member of club or organization: Not on file    Attends meetings of clubs or organizations: Not on file    Relationship status: Not on file  . Intimate partner violence    Fear of current or ex partner: Not  on file    Emotionally abused: Not on file    Physically abused: Not on file    Forced sexual activity: Not on file  Other Topics Concern  . Not on file  Social History Narrative   Grew up in [place], attended [employer]. First wife died of breast ca in July 09, 1997. 3 children. Remarried- 8 years. 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    Pt signed designated party release granting access to [facility] to his wife Tonia Ghent. Detailed message may be left on home or cell phone. Shanon Payor August 13, 2009 11:34 am.     Past Surgical History:  Procedure Laterality Date  . None      Family History  Problem Relation Age of Onset  . Cancer Father        multiple melanoma  . Lupus Sister   . Asthma Daughter     Allergies  Allergen Reactions  . Ace Inhibitors     REACTION: cough  . Isosorb Dinitrate-Hydralazine     REACTION:  dizziness/hypotensive    Current Outpatient Medications on File Prior to Visit  Medication Sig Dispense Refill  . aspirin 81 MG tablet Take 81 mg by mouth daily.      . Fluticasone-Salmeterol (ADVAIR) 500-50 MCG/DOSE AEPB INHALE 1 DOSE BY MOUTH TWICE DAILY 60 each 5  . losartan (COZAAR) 50 MG tablet Take 1 tablet (50 mg total) by mouth 2 (two) times daily. 180 tablet 1   No current facility-administered medications on file prior to visit.     BP 120/71 (BP Location: Right Arm, Patient Position: Sitting, Cuff Size: Large)   Pulse 84   Temp (!) 97.3 F (36.3 C) (Temporal)   Resp 16   Ht 6' (1.829 m)   Wt 258 lb (117 kg)   SpO2 100%   BMI 34.99 kg/m    Objective:   Physical Exam Constitutional:      General: He is not in acute distress.    Appearance: He is well-developed.  HENT:     Head: Normocephalic and atraumatic.  Cardiovascular:     Rate and Rhythm: Normal rate and regular rhythm.     Heart sounds: No murmur.  Pulmonary:     Effort: Pulmonary effort is normal. No respiratory distress.     Breath sounds: Normal breath sounds. No wheezing or rales.  Skin:    General: Skin is warm and dry.  Neurological:     Mental Status: He is alert and oriented to person, place, and time.     Comments: Left lower extremity strength is 5 out of 5, right lower extremity strength is slightly diminished.  Bilateral upper extremity strength is 5 out of 5.  Psychiatric:        Behavior: Behavior normal.        Thought Content: Thought content normal.           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  Lumbar radiculopathy-trial of Medrol Dosepak.  Hypertension- blood pressure is stable on current regimen.  Continue same.  Will obtain complete metabolic panel.  Hyperglycemia- will obtain follow-up A1c  We discussed a taper off of Zoloft.  Taper instruction is to decrease from 50 mg of Zoloft once daily to 25 mg of Zoloft once daily  for 2 weeks then every other day for 2 weeks then stop.  He is requesting a handicap placard due to his back pain and difficulty  ambulating.  I did fill out a temporary placard form for him for the next 6 months.

## 2018-11-08 NOTE — Progress Notes (Signed)
Mailed out to pt 

## 2018-11-10 ENCOUNTER — Telehealth: Payer: Self-pay | Admitting: Family

## 2018-11-10 NOTE — Telephone Encounter (Signed)
Patient was confused and wanted clarification due to instructions on prednizone pack different than the directions given by [facility]. Patient advised to take as indicated by [facility], one tab once a day for 21 days. He verbalized understanding.

## 2018-11-10 NOTE — Telephone Encounter (Signed)
Patient would like PCP or nurse to call back due to his medication is not working. Patient call back [phone]

## 2018-11-10 NOTE — Telephone Encounter (Signed)
Unfortunately it is only supposed to be used short term over a 1 week taper so we should not continue longer than that. I would recommend that he add tylenol 1000mg  twice daily and let me know how he is doing in about 1 week.

## 2018-11-10 NOTE — Telephone Encounter (Signed)
Pt states he gets relief after taking one of the  methylPREDNISolone (MEDROL) 4 MG tablet  It last to around evening, then pain returns again as before.  Pt wants to know if OK to take 2 a day? If so, should he have a new Rx to get him through the 21 days?

## 2018-11-16 ENCOUNTER — Telehealth: Payer: Self-pay | Admitting: Family

## 2018-11-16 NOTE — Telephone Encounter (Signed)
Patient called stating he is still in pain in his  Both thighs, lower hips and both shoulders. Patient is currently taking methylPREDNISolone, patient states it is not working. Patient would like to be prescribed a different medication. Patient call back [phone]

## 2018-11-16 NOTE — Telephone Encounter (Signed)
Patient is calling after hours to check on the advise from [facility] regarding the methylprednisolone that is not working.  Please advise CB- [phone] or [phone]

## 2018-11-17 MED ORDER — TRAMADOL HCL 50 MG PO TABS: 50.0000 mg | ORAL_TABLET | Freq: Three times a day (TID) | ORAL | 0 refills | Status: AC | PRN

## 2018-11-17 NOTE — Telephone Encounter (Signed)
Advised patient of new rx and was scheduled to be back on 11-21-2018

## 2018-11-17 NOTE — Telephone Encounter (Signed)
Please contact pt and let him know that I have sent a short term supply of tramadol to use as needed for pain. I would like to see him back in the office next week for re-evaluation please.

## 2018-11-21 ENCOUNTER — Ambulatory Visit (INDEPENDENT_AMBULATORY_CARE_PROVIDER_SITE_OTHER): Payer: Medicare Other | Admitting: Family

## 2018-11-21 ENCOUNTER — Other Ambulatory Visit: Payer: Self-pay

## 2018-11-21 VITALS — BP 123/65 | HR 69 | Temp 97.9°F | Ht 72.0 in | Wt 257.2 lb

## 2018-11-21 DIAGNOSIS — M5412 Radiculopathy, cervical region: Secondary | ICD-10-CM | POA: Diagnosis not present

## 2018-11-21 DIAGNOSIS — R29898 Other symptoms and signs involving the musculoskeletal system: Secondary | ICD-10-CM

## 2018-11-21 DIAGNOSIS — Z23 Encounter for immunization: Secondary | ICD-10-CM | POA: Diagnosis not present

## 2018-11-21 DIAGNOSIS — M5416 Radiculopathy, lumbar region: Secondary | ICD-10-CM

## 2018-11-21 MED ORDER — METHYLPREDNISOLONE 4 MG PO TBPK: ORAL_TABLET | ORAL | 0 refills | Status: DC

## 2018-11-21 NOTE — Progress Notes (Signed)
Subjective:    Patient ID: Brandon [name], male    DOB: October 15, 1940, 79 y.o.   MRN: [id number]  HPI  Patient is a 78 year old male who presents today for follow-up on his cervical radiculopathy and lumbar radiculopathy.  He was last seen on November 07, 2018.  At that time we gave him a trial of Medrol Dosepak. He notes only mild improvement with the medrol dosepak  He reports that symptoms are worst in the AM when he wakes up.  Notes some improvement with the tramadol dose in the AM. By the evening symptoms are improved. Notes some AM bilateral hand numbness.   Denies bowel/bladder incontinence   Review of Systems  See HPI  Past Medical History:  Diagnosis Date   Asthma    Hx of childhood asthma, disappeared for a while, then resurfaced 6-7 years ago.    Cardiomyopathy    with a negative cardiac catheterization in the past. 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 started when 18, 1 ppd  Substance and Sexual Activity   Alcohol use: Yes    Comment: occasionaly    Drug use: Not on file   Sexual activity: Not on file  Lifestyle   Physical activity    Days per week: Not on file    Minutes per session: Not on file   Stress: Not on file  Relationships   Social connections      Talks on phone: Not on file    Gets together: Not on file    Attends religious service: Not on file    Active member of club or organization: Not on file    Attends meetings of clubs or organizations: Not on file    Relationship status: Not on file   Intimate partner violence    Fear of current or ex partner: Not on file    Emotionally abused: Not on file    Physically abused: Not on file    Forced sexual activity: Not on file  Other Topics Concern   Not on file  Social History Narrative   Grew up in [place], attended [employer]. First wife died of breast ca in 07/07/1997. 3 children. Remarried- 8 years. 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    Pt signed designated party release granting access to [facility] to his wife [name]. Detailed message may be left on home or cell phone. Roselle Locus August 13, 2009 11:34 am.     Past Surgical History:  Procedure Laterality Date   None      Family History  Problem Relation Age of Onset  Cancer Father        multiple melanoma   Lupus Sister    Asthma Daughter     Allergies  Allergen Reactions   Ace Inhibitors     REACTION: cough   Isosorb Dinitrate-Hydralazine     REACTION: dizziness/hypotensive    Current Outpatient Medications on File Prior to Visit  Medication Sig Dispense Refill   amLODipine (NORVASC) 10 MG tablet Take 1 tablet (10 mg total) by mouth daily. 90 tablet 1   aspirin 81 MG tablet Take 81 mg by mouth daily.       Fluticasone-Salmeterol (ADVAIR) 500-50 MCG/DOSE AEPB INHALE 1 DOSE BY MOUTH TWICE DAILY 60 each 5   losartan (COZAAR) 50 MG tablet Take 1 tablet (50 mg total) by mouth 2 (two) times daily. 180 tablet 1   metoprolol succinate (TOPROL-XL) 25 MG 24 hr tablet Take 1 tablet (25 mg total) by mouth daily. 90 tablet 1   montelukast (SINGULAIR) 10 MG tablet Take 1 tablet (10 mg total) by mouth at bedtime. 90 tablet 1   PROAIR HFA 108 (90 Base) MCG/ACT inhaler INHALE 2 PUFFS  BY MOUTH EVERY 6 HOURS AS NEEDED FOR WHEEZING AND FOR SHORTNESS OF BREATH 6.7 g 3   sertraline (ZOLOFT) 25 MG tablet Takes 1 tab by mouth once daily for 2 weeks, then decrease to 25mg  every other day 30 tablet 0   spironolactone (ALDACTONE) 25 MG tablet Take 1 tablet (25 mg total) by mouth daily. 90 tablet 1   traMADol (ULTRAM) 50 MG tablet Take 1 tablet (50 mg total) by mouth every 8 (eight) hours as needed for up to 5 days. 15 tablet 0   No current facility-administered medications on file prior to visit.     BP 123/65 (BP Location: Right Arm, Cuff Size: Large)    Pulse 69    Temp 97.9 F (36.6 C) (Temporal)    Ht 6' (1.829 m)    Wt 257 lb 3.2 oz (116.7 kg)    SpO2 99%    BMI 34.88 kg/m       Objective:   Physical Exam Constitutional:      General: He is not in acute distress.    Appearance: He is well-developed.  HENT:     Head: Normocephalic and atraumatic.  Cardiovascular:     Rate and Rhythm: Normal rate and regular rhythm.     Heart sounds: No murmur.  Pulmonary:     Effort: Pulmonary effort is normal. No respiratory distress.     Breath sounds: Normal breath sounds. No wheezing or rales.  Skin:    General: Skin is warm and dry.  Neurological:     Mental Status: He is alert and oriented to person, place, and time.     Comments: Bilateral UE hand grasp 4-5/5, bilateral UE strength is 4-5/5  Bilateral LE strength is 5/5  Psychiatric:        Behavior: Behavior normal.        Thought Content: Thought content normal.           Assessment & Plan:   Cervical radiculopathy- Has UE weakness. Recommend MRI. He had been taking the medrol 1 tab once daily. I sent a refill and advised pt to restart and take per package instructions.   Lumbar radiculopathy- symptoms are less severe than the cervical radiculopathy. Will see how he responds to the medrol.   OK to continue tramadol prn short term. Hopefully he will not need this long term.

## 2018-11-21 NOTE — Patient Instructions (Addendum)
Please restart medrol dose pak and follow instructions on the package. Call if symptoms worsen or if symptoms fail to improve. You should be contacted about scheduling your MRI.

## 2018-11-24 ENCOUNTER — Telehealth: Payer: Self-pay | Admitting: Family

## 2018-11-24 NOTE — Telephone Encounter (Signed)
-----   Message from Katha Hamming sent at 11/23/2018  1:52 PM EDT ----- Regarding: mri cervical I called [facility] to schedule his MRI.  He declined the MRI stating he was too claustrophobic even if he took claustrophobia medication.  Thanks, Hoyle Sauer

## 2018-11-24 NOTE — Telephone Encounter (Signed)
Would you please contact pt and see if he would be willing to do an open MRI? If so I will work on scheduling.

## 2018-11-29 ENCOUNTER — Telehealth: Payer: Self-pay

## 2018-11-29 ENCOUNTER — Telehealth: Payer: Self-pay | Admitting: Family

## 2018-11-29 DIAGNOSIS — M5412 Radiculopathy, cervical region: Secondary | ICD-10-CM

## 2018-11-29 NOTE — Telephone Encounter (Signed)
Copied from [place] [id number]. Topic: General - Other >> Nov 29, 2018  8:57 AM Celene Kras A wrote: Reason for CRM: Pt called stating the tramadol is not helping him with his pain. Pt is requesting a stronger medication. Please advise.   [facility] [address] [place], [place] - [address]  [place] [address]  Phone: [phone] Fax: [phone]  Not a 24 hour pharmacy; exact hours not known.

## 2018-11-29 NOTE — Telephone Encounter (Signed)
Copied from [place] [id number]. Topic: General - Other >> Nov 29, 2018  8:57 AM Celene Kras A wrote: Reason for CRM: Pt called stating the tramadol is not helping him with his pain. Pt is requesting a stronger medication. Please advise.

## 2018-11-30 MED ORDER — GABAPENTIN 100 MG PO CAPS: 100.0000 mg | ORAL_CAPSULE | Freq: Three times a day (TID) | ORAL | 0 refills | Status: DC

## 2018-11-30 NOTE — Telephone Encounter (Signed)
Left detailed message on pt's cell that I sent an rx to his pharmacy for gabapentin to be taken in addition to tramadol and that I will work on trying to get him set up for an open MRI.

## 2018-12-04 ENCOUNTER — Ambulatory Visit: Payer: Self-pay | Admitting: *Deleted

## 2018-12-04 NOTE — Telephone Encounter (Signed)
Unable to reach patient's wife Tonia Ghent on her contact number, no answer, no voicemail.  Called mobile contact number listed on chart.  Left voicemail on this number for patient or his wife to return call.

## 2018-12-04 NOTE — Telephone Encounter (Signed)
Numerous attempts have been made to contact patient and his wife.  Will forward note to PCP office to make them aware of patient's issue.

## 2018-12-04 NOTE — Telephone Encounter (Signed)
Attempted to contact patient's wife again, Unable to reach her on her contact number, no answer, no voicemail.  Called mobile contact number listed on chart.  Left voicemail on this number for patient or his wife to return call.

## 2018-12-05 ENCOUNTER — Ambulatory Visit (INDEPENDENT_AMBULATORY_CARE_PROVIDER_SITE_OTHER): Payer: Medicare Other | Admitting: Family

## 2018-12-05 ENCOUNTER — Telehealth: Payer: Self-pay | Admitting: Family

## 2018-12-05 ENCOUNTER — Other Ambulatory Visit: Payer: Self-pay

## 2018-12-05 DIAGNOSIS — M5410 Radiculopathy, site unspecified: Secondary | ICD-10-CM

## 2018-12-05 DIAGNOSIS — M1990 Unspecified osteoarthritis, unspecified site: Secondary | ICD-10-CM | POA: Diagnosis not present

## 2018-12-05 DIAGNOSIS — M5412 Radiculopathy, cervical region: Secondary | ICD-10-CM

## 2018-12-05 MED ORDER — DIAZEPAM 5 MG PO TABS: ORAL_TABLET | ORAL | 0 refills | Status: DC

## 2018-12-05 MED ORDER — HYDROCODONE-ACETAMINOPHEN 5-325 MG PO TABS: 1.0000 | ORAL_TABLET | Freq: Four times a day (QID) | ORAL | 0 refills | Status: DC | PRN

## 2018-12-05 NOTE — Telephone Encounter (Signed)
Talked to his wife a few times yesterday, will call them again today for a possible virtual visit later today.

## 2018-12-05 NOTE — Telephone Encounter (Signed)
°  Relation to pt: self Call back number: [phone] home # or mobile # [phone] Pharmacy:  [facility], [place] [phone] (Phone) [phone] (Fax)     Reason for call:  Patient requesting pain medication for shoulder, wrist, hand and thigh pain. Patient states he has an imaging appointment scheduled for Saturday but currently in pain now and would like a follow up call regarding pain medication request.

## 2018-12-05 NOTE — Telephone Encounter (Signed)
Talked to patient and scheduled him for a virtual visit today

## 2018-12-05 NOTE — Telephone Encounter (Signed)
Called several times but no answer, lvm for patient to cal me back.

## 2018-12-05 NOTE — Telephone Encounter (Signed)
Patient called back for Rod Holler say that he can be reached at Ph# [phone]

## 2018-12-05 NOTE — Progress Notes (Signed)
Virtual Visit via Video Note  I connected with Brandon [name] on 12/05/18 at  5:40 PM EDT by a video enabled telemedicine application and verified that I am speaking with the correct person using two identifiers.  Location: Patient: home Provider: wor   I discussed the limitations of evaluation and management by telemedicine and the availability of in person appointments. The patient expressed understanding and agreed to proceed.  History of Present Illness:  Patient is a 77 yr old male who presents today to discuss his ongoing cervical radiculopathy.  We initially saw him on 11/07/18 and he was treated with a medrol dose pak.  We saw him back on 11/21/18 and he was noted to have RUE weakness. He was referred for MRI. MRI was scheduled but then he cancelled due to the fact that he has claustrophobia and it was a closed MRI. We repeated the medrol dose pack on 11/21/18.  He reports that there was no improvement with gabapentin or the medrol repeat pack. We then gave him tramadol.  He got back in touch with [place] on 9/16 and reported that tramadol was not helping.    Reports that he has to sit  up to sleep in the recliner. Can't lay on the right side due to the pain. States that pain is worst in the AM. He reports that bilateral hand/wrist swelling which have cone down a little bit.  Reports that pain is from the shoulder down to the elbow.  Reports that he used some arthritic medication on his wrist/hand.   Past Medical History:  Diagnosis Date  . Asthma    Hx of childhood asthma, disappeared for a while, then resurfaced 6-7 years ago.   . Cardiomyopathy    with a negative cardiac catheterization in the past. (EF appriximately 40-45%)   . HTN (hypertension)    x 30 years  . Obesity, unspecified   . Sleep apnea    CPAP  . 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 Marital status: Married    Spouse name: [name]  . Number of  children: 3  . Years of education: Not on file  . Highest education level: Not on file  Occupational History  . Occupation: retired  Engineer, production  . Financial resource strain: Not on file  . Food insecurity    Worry: Not on file    Inability: Not on file  . Transportation needs    Medical: Not on file    Non-medical: Not on file  Tobacco Use  . Smoking status: Former Games developer  . Smokeless tobacco: Never Used  . Tobacco comment: quit smoling in 1990. started when 18, 1 ppd  Substance and Sexual Activity  . Alcohol use: Yes    Comment: occasionaly   . Drug use: Not on file  . Sexual activity: Not on file  Lifestyle  . Physical activity    Days per week: Not on file    Minutes per session: Not on file  . Stress: Not on file  Relationships  . Social Musician on phone: Not on file    Gets together: Not on file    Attends religious service: Not on file    Active member of club or organization: Not on file    Attends meetings of clubs or organizations: Not on file    Relationship status: Not on file  . Intimate partner violence  Fear of current or ex partner: Not on file    Emotionally abused: Not on file    Physically abused: Not on file    Forced sexual activity: Not on file  Other Topics Concern  . Not on file  Social History Narrative   Grew up in [place], attended [employer]. First wife died of breast ca in 07/02/97. 3 children. Remarried- 8 years. 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    Pt signed designated party release granting access to [facility] to his wife Tonia Ghent. Detailed message may be left on home or cell phone. Shanon Payor August 13, 2009 11:34 am.     Past Surgical History:  Procedure Laterality Date  . None      Family History  Problem Relation Age of Onset  . Cancer Father        multiple melanoma  . Lupus Sister   . Asthma Daughter     Allergies  Allergen Reactions  . Ace Inhibitors     REACTION: cough   . Isosorb Dinitrate-Hydralazine     REACTION: dizziness/hypotensive    Current Outpatient Medications on File Prior to Visit  Medication Sig Dispense Refill  . amLODipine (NORVASC) 10 MG tablet Take 1 tablet (10 mg total) by mouth daily. 90 tablet 1  . aspirin 81 MG tablet Take 81 mg by mouth daily.      . Fluticasone-Salmeterol (ADVAIR) 500-50 MCG/DOSE AEPB INHALE 1 DOSE BY MOUTH TWICE DAILY 60 each 5  . gabapentin (NEURONTIN) 100 MG capsule Take 1 capsule (100 mg total) by mouth 3 (three) times daily. 90 capsule 0  . losartan (COZAAR) 50 MG tablet Take 1 tablet (50 mg total) by mouth 2 (two) times daily. 180 tablet 1  . methylPREDNISolone (MEDROL DOSEPAK) 4 MG TBPK tablet Take per package instructions 21 tablet 0  . metoprolol succinate (TOPROL-XL) 25 MG 24 hr tablet Take 1 tablet (25 mg total) by mouth daily. 90 tablet 1  . montelukast (SINGULAIR) 10 MG tablet Take 1 tablet (10 mg total) by mouth at bedtime. 90 tablet 1  . PROAIR HFA 108 (90 Base) MCG/ACT inhaler INHALE 2 PUFFS BY MOUTH EVERY 6 HOURS AS NEEDED FOR WHEEZING AND FOR SHORTNESS OF BREATH 6.7 g 3  . sertraline (ZOLOFT) 25 MG tablet Takes 1 tab by mouth once daily for 2 weeks, then decrease to 25mg  every other day 30 tablet 0  . spironolactone (ALDACTONE) 25 MG tablet Take 1 tablet (25 mg total) by mouth daily. 90 tablet 1   No current facility-administered medications on file prior to visit.     There were no vitals taken for this visit.      Observations/Objective:   Gen: Awake, alert, no acute distress Resp: Breathing is even and non-labored Psych: calm/pleasant demeanor Neuro: Alert and Oriented x 3, + facial symmetry, speech is clear.   Assessment and Plan:  Cervical radiculopathy- advised pt to keep his upcoming appointment for MRI.  He is having uncontrolled pain.  Review of the [place] controlled substance registry is performed.  I have given him a 5-day supply of hydrocodone as needed.  He is  extremely anxious about his claustrophobia and his upcoming MRI.  I have given him a prescription for Valium to be taken 60 minutes prior to his MRI.  He understands that he will need to arrange transportation to and from his MRI.  I also advised him not to use hydrocodone with his Valium dose as  this could cause an unsafe interaction.  Patient verbalizes understanding.  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 Will monitor.  Further recommendations pending review of cervical MRI.   Follow Up Instructions:    I discussed the assessment and treatment plan with the patient. The patient was provided an opportunity to ask questions and all were answered. The patient agreed with the plan and demonstrated an understanding of the instructions.   The patient was advised to call back or seek an in-person evaluation if the symptoms worsen or if the condition fails to improve as anticipated.  [name], NP

## 2018-12-09 ENCOUNTER — Ambulatory Visit (HOSPITAL_COMMUNITY)
Admission: RE | Admit: 2018-12-09 | Discharge: 2018-12-09 | Disposition: A | Discharge status: Home / Self Care | Payer: Medicare Other | Source: Ambulatory Visit | Attending: Family | Admitting: Family

## 2018-12-09 ENCOUNTER — Other Ambulatory Visit: Payer: Self-pay

## 2018-12-09 DIAGNOSIS — M5412 Radiculopathy, cervical region: Secondary | ICD-10-CM | POA: Insufficient documentation

## 2018-12-09 IMAGING — MR MR CERVICAL SPINE W/O CM
4 of 6 series · 19 of 48 positions shown · non-contrast
Comparison: Cervical spine radiographs [DATE]

CLINICAL DATA: Neck pain.  Cervical radiculopathy

EXAM:
MRI CERVICAL SPINE WITHOUT CONTRAST
TECHNIQUE: Multiplanar, multisequence MR imaging of the cervical spine was
performed. No intravenous contrast was administered.

[Series 2: T2 · sagittal · 3.0mm · 0.43mm/px · 3 of 16 slices shown (1 of 2)]
[im 1/16]
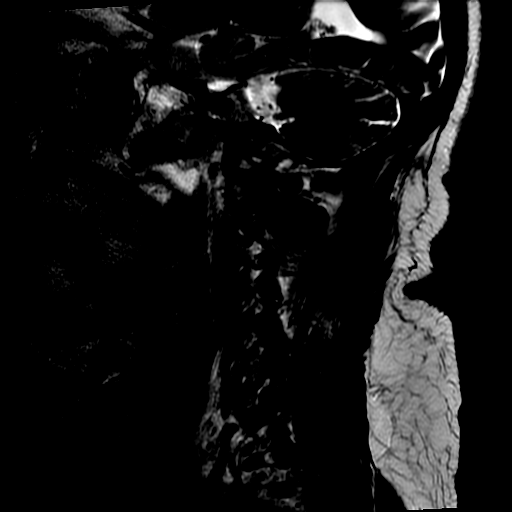
[im 8/16]
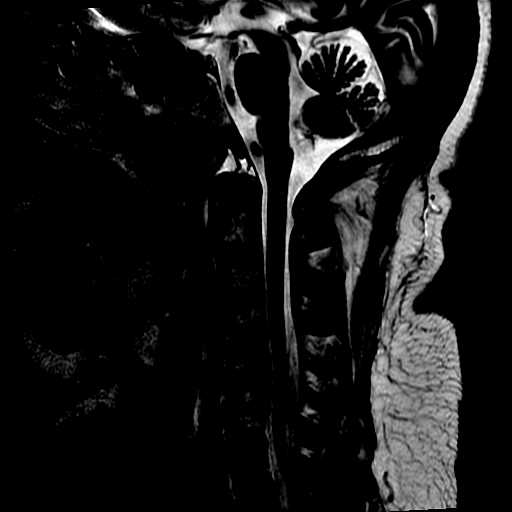
[im 16/16]
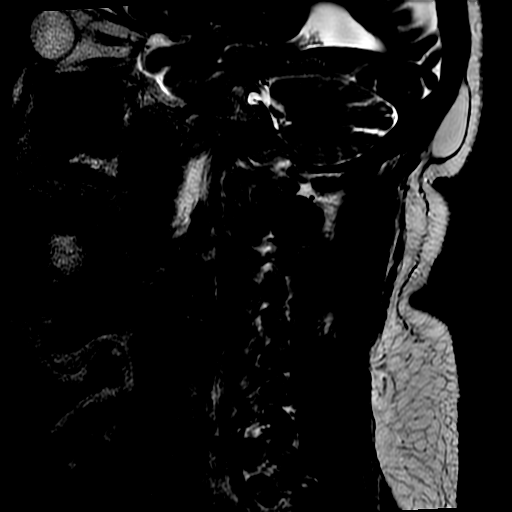

[Series 5: STIR · sagittal · 3.0mm · 0.43mm/px · 3 of 16 slices shown]
[im 1/16]
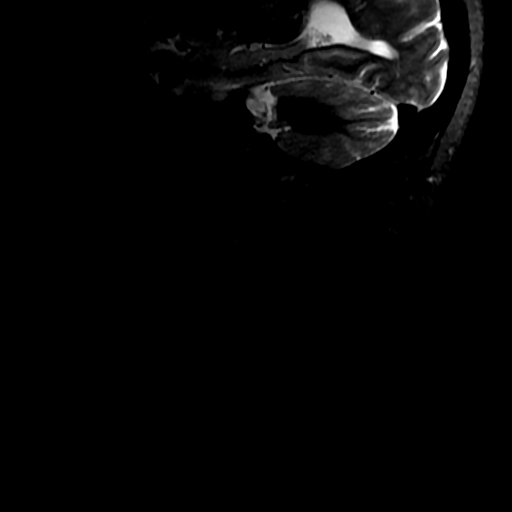
[im 11/16]
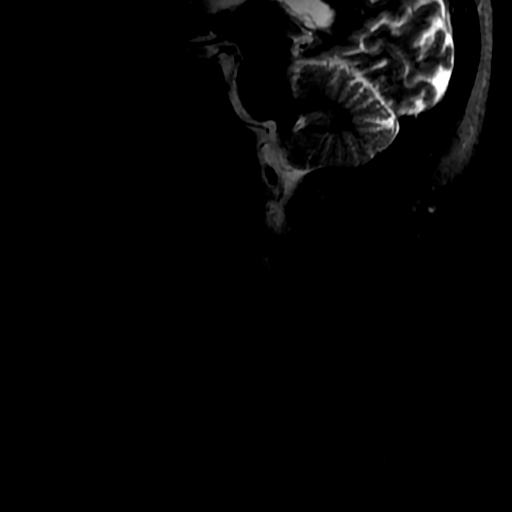
[im 16/16]
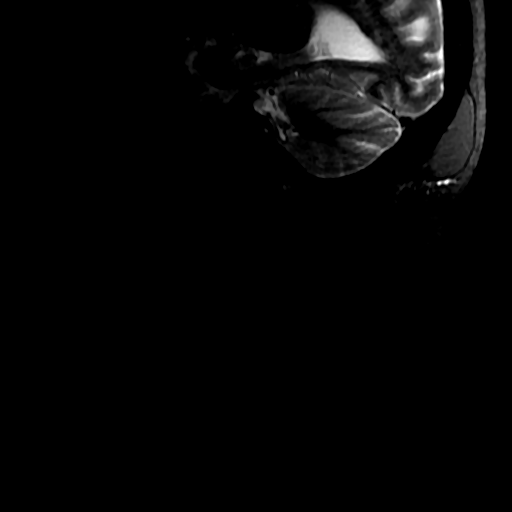

[Series 7: T2 · axial · 3.0mm · 0.35mm/px · z∈[-139,-44]mm · 8 of 31 slices shown (2 of 2)]
[im 1/31]
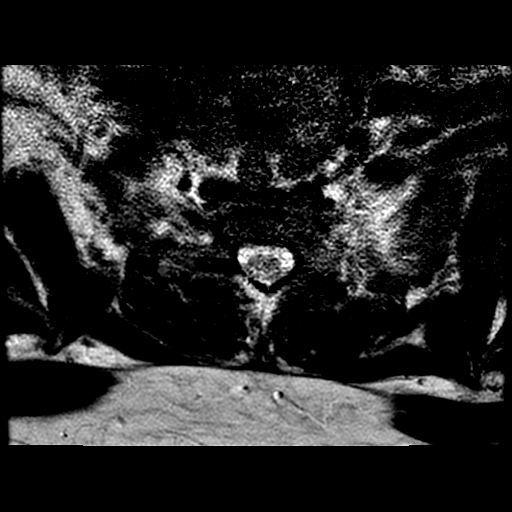
[im 5/31]
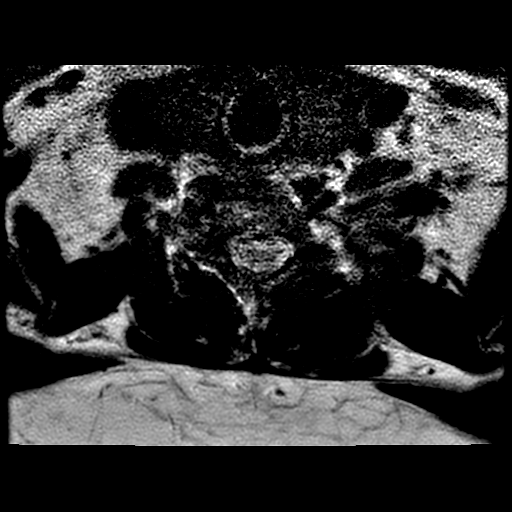
[im 9/31]
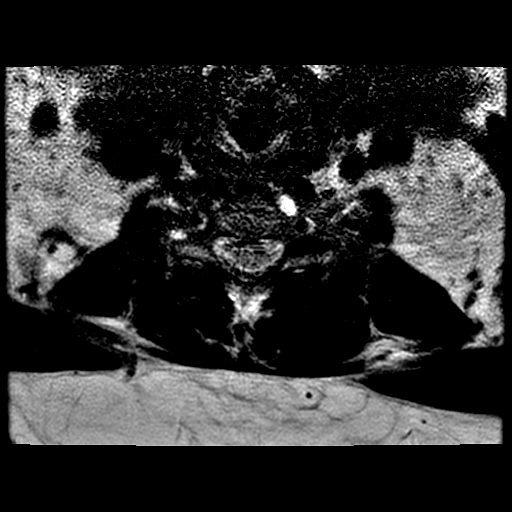
[im 13/31]
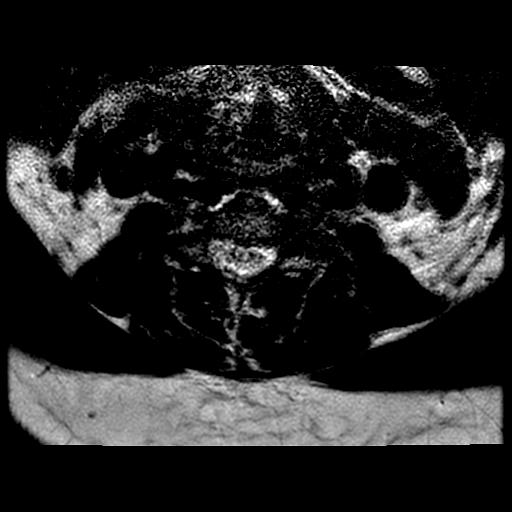
[im 18/31]
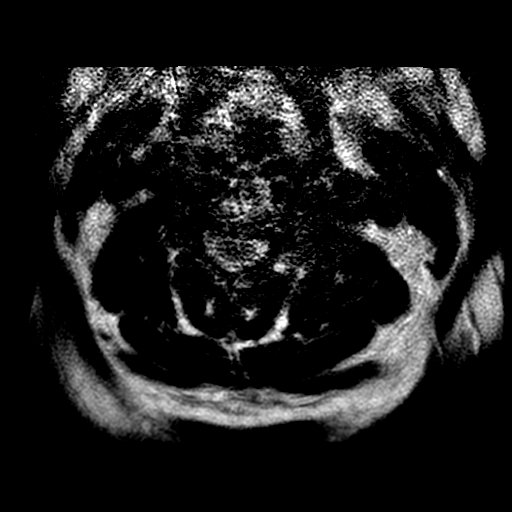
[im 22/31]
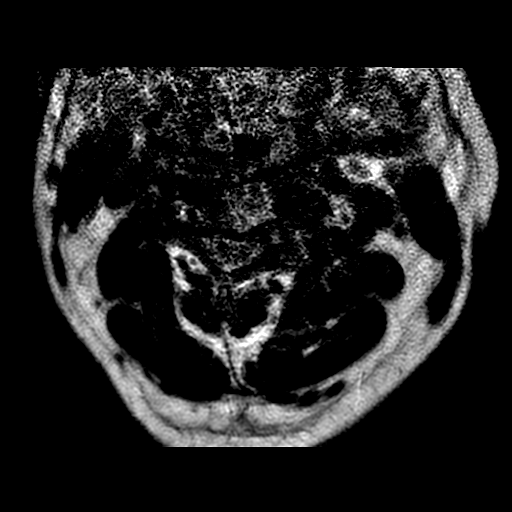
[im 26/31]
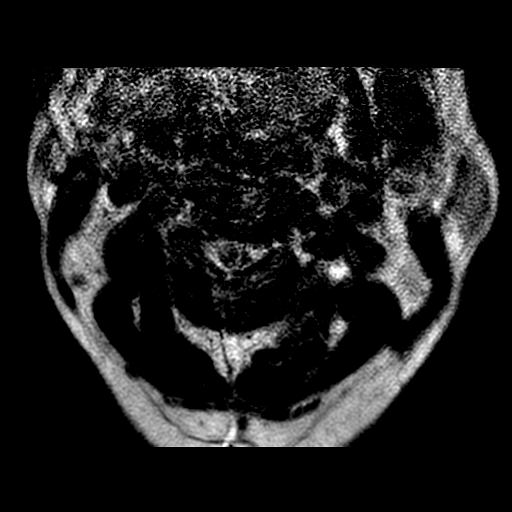
[im 31/31]
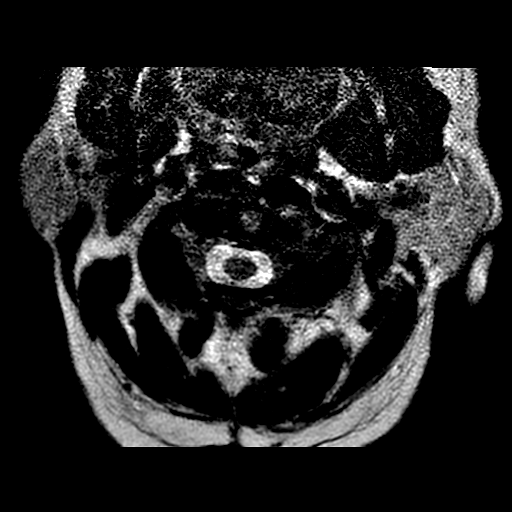

[Series 8: T1 · axial · non-contrast · 3.0mm · 0.35mm/px · z∈[-139,-60]mm · 5 of 31 slices shown]
[im 1/31]
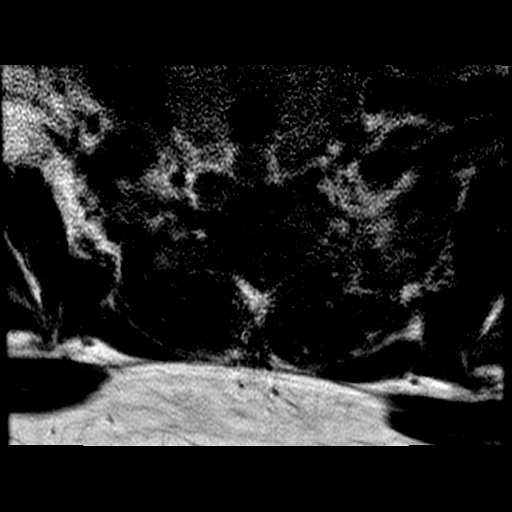
[im 5/31]
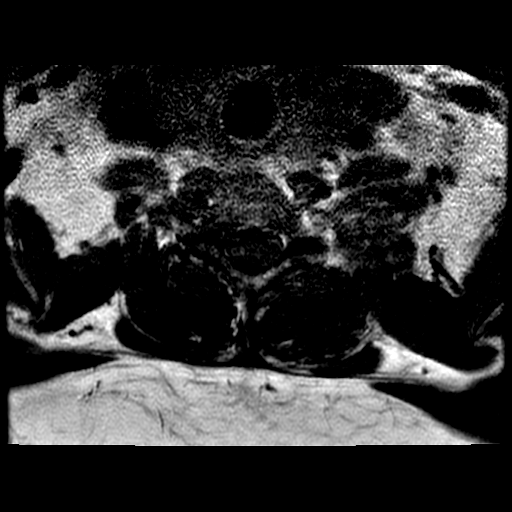
[im 9/31]
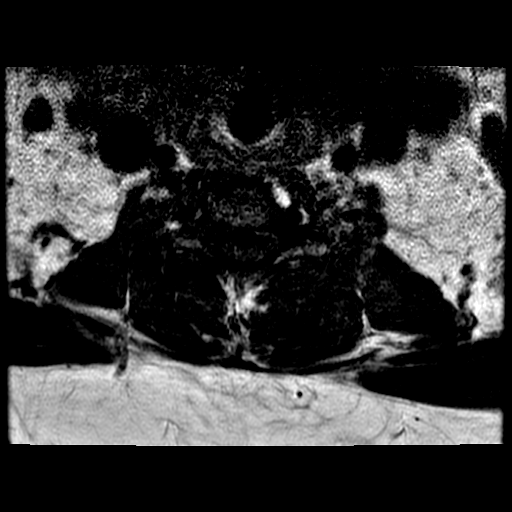
[im 18/31]
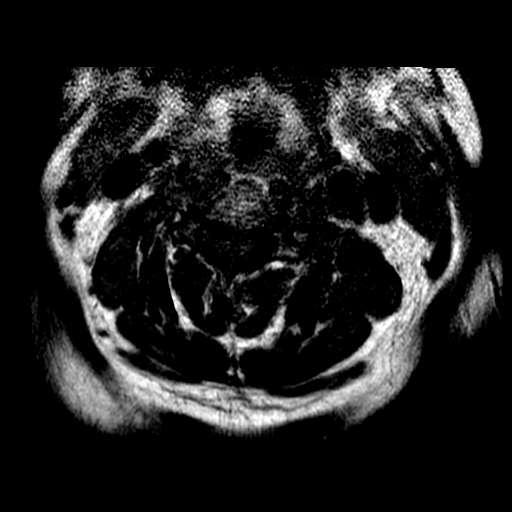
[im 26/31]
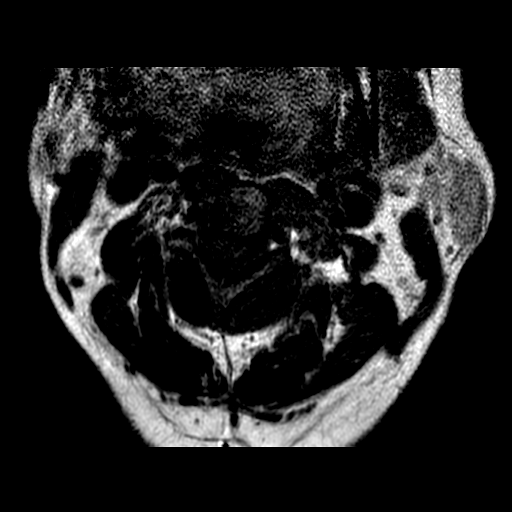

[19 of 48 positions shown; findings below may reference images not displayed]

FINDINGS: Alignment: Mild retrolisthesis C6-7. Straightening of the cervical
lordosis

Image quality degraded by moderate motion.

Vertebrae: Negative for fracture or mass.

Cord: Cord signal evaluation limited by motion. Allowing for this,
no signal and abnormality in the cord.

Posterior Fossa, vertebral arteries, paraspinal tissues: Negative
for paraspinous mass or adenopathy

Disc levels:

C2-3: Negative

C3-4: Negative

C4-5: Negative

C5-6: Mild disc degeneration with degenerative changes in the
endplates. Moderate left foraminal narrowing due to spurring.

C6-7: Mild retrolisthesis. Disc degeneration with endplate changes
due to degenerative disc disease. Mild to moderate foraminal
encroachment bilaterally due to spurring

C7-T1: Negative
IMPRESSION: Motion degraded study

Cervical spondylosis at C5-6 and C6-7.

## 2018-12-11 ENCOUNTER — Telehealth: Payer: Self-pay | Admitting: *Deleted

## 2018-12-11 NOTE — Telephone Encounter (Signed)
I would advise ED if symptoms that severe.

## 2018-12-11 NOTE — Telephone Encounter (Signed)
Ok. Thanks!

## 2018-12-11 NOTE — Telephone Encounter (Signed)
Advised patient to go ER.  He will go to ER.  I advised him as well that I will get this message to [facility] for review as well.

## 2018-12-11 NOTE — Telephone Encounter (Signed)
Spoke with Tonia Ghent patients wife.  Advised her of the results of MRI and she states the patient has gotten worse.  Hands and feet are swollen, cannot hardly lift arms, shoulders hurt.  He is not able to get in bed and is hard to get any sleep.    Should patient be evaluated today by ER or can see another provider today?

## 2018-12-12 ENCOUNTER — Telehealth: Payer: Self-pay | Admitting: Cardiology

## 2018-12-12 ENCOUNTER — Telehealth: Payer: Self-pay | Admitting: Family

## 2018-12-12 DIAGNOSIS — M4802 Spinal stenosis, cervical region: Secondary | ICD-10-CM

## 2018-12-12 NOTE — Telephone Encounter (Signed)
Disregard last message. Another nurse as spoken with patient.

## 2018-12-12 NOTE — Telephone Encounter (Signed)
Pt c/o swelling: STAT is pt has developed SOB within 24 hours  1) How much weight have you gained and in what time span? No weight gaines- pt wants to be seen- I made an appt for 12-22-18. Please evaluate to see if pt needs to be seen sooner  2) If swelling, where is the swelling located? Feet, ankles and hands  3) Are you currently taking a fluid pill? yes  4) Are you currently SOB? no  5) Do you have a log of your daily weights (if so, list)? no  6) Have you gained 3 pounds in a day or 5 pounds in a week? no  7) Have you traveled recently?  no

## 2018-12-12 NOTE — Telephone Encounter (Signed)
Patient advised of results and referral he agrees with the plan. Referral entered

## 2018-12-12 NOTE — Telephone Encounter (Signed)
Agree 

## 2018-12-12 NOTE — Telephone Encounter (Signed)
Spoke with pt who states that he was seen by orthopedic specialist today and encouraged to be seen by cardiologist for swelling to hands, feet, and ankles over the last months. Pt denies chest pain and SOB at this time. Appt scheduled for 12/22/18 with Dr. Percival Spanish.

## 2018-12-12 NOTE — Telephone Encounter (Signed)
Attempted to contact patient to discuss swelling in legs further- no answer. Had to leave a message if he needed [place] to call back. If not appointment made for 10/09.

## 2018-12-12 NOTE — Telephone Encounter (Signed)
Please let pt know that his MRI shows some arthritis changes in his neck which could explain his pain. I would recommend referral to neurosurgery for further evaluation. Referral pended.

## 2018-12-19 ENCOUNTER — Ambulatory Visit: Payer: Medicare Other | Admitting: Family

## 2018-12-19 DIAGNOSIS — Z0289 Encounter for other administrative examinations: Secondary | ICD-10-CM

## 2018-12-21 NOTE — Progress Notes (Signed)
HPI The patient presents for followup of known cardiomyopathy. Since I last saw him he has had tragic loss with his 78 year old nephew dying of [place].  His sister mother of that nephew died a month later in 12/13/22 also from [place] at the same hospital in [place].  The patient has had a really hard time coping with this.  At the same time he has been bothered by diffuse muscle aches.  He says he has just aching in his upper and lower extremities and he has had a work-up including injection with corticosteroids and oral steroids which he completed a little over a month ago.  I saw an MRI and he had some cervical spondylosis and has seen a chiropractor as well as orthopedist.  To go along with this he has had diffuse swelling.  This is been in his hands and arms and legs.  His weight is been stable.  He is not describing new PND or orthopnea.  He is not having any palpitations, presyncope or syncope.  He has no new chest pressure, neck or arm discomfort.  He has trouble sleeping sometimes flat but only because of the discomfort throughout his body.  He says he might slowly be turning the corner on this.  He is not been able to be active because of this of the virus.   Allergies  Allergen Reactions  . Ace Inhibitors     REACTION: cough  . Isosorb Dinitrate-Hydralazine     REACTION: dizziness/hypotensive    Current Outpatient Medications  Medication Sig Dispense Refill  . amLODipine (NORVASC) 10 MG tablet Take 1 tablet (10 mg total) by mouth daily. 90 tablet 1  . aspirin 81 MG tablet Take 81 mg by mouth daily.      . diazepam (VALIUM) 5 MG tablet Take 1 tablet by mouth 60 minutes prior to MRI.  May take second tablet but only if needed. Do not take with pain medication (hydrocodone or tramadol) 2 tablet 0  . Fluticasone-Salmeterol (ADVAIR) 500-50 MCG/DOSE AEPB INHALE 1 DOSE BY MOUTH TWICE DAILY 60 each 5  . furosemide (LASIX) 20 MG tablet Take 1 tablet (20 mg total) by mouth daily. 3  tablet 0  . gabapentin (NEURONTIN) 100 MG capsule Take 1 capsule (100 mg total) by mouth 3 (three) times daily. 90 capsule 0  . HYDROcodone-acetaminophen (NORCO/VICODIN) 5-325 MG tablet Take 1 tablet by mouth every 6 (six) hours as needed for moderate pain. 20 tablet 0  . losartan (COZAAR) 50 MG tablet Take 1 tablet (50 mg total) by mouth 2 (two) times daily. 180 tablet 1  . methylPREDNISolone (MEDROL DOSEPAK) 4 MG TBPK tablet Take per package instructions 21 tablet 0  . metoprolol succinate (TOPROL-XL) 25 MG 24 hr tablet Take 1 tablet (25 mg total) by mouth daily. 90 tablet 1  . montelukast (SINGULAIR) 10 MG tablet Take 1 tablet (10 mg total) by mouth at bedtime. 90 tablet 1  . PROAIR HFA 108 (90 Base) MCG/ACT inhaler INHALE 2 PUFFS BY MOUTH EVERY 6 HOURS AS NEEDED FOR WHEEZING AND FOR SHORTNESS OF BREATH 6.7 g 3  . sertraline (ZOLOFT) 25 MG tablet Takes 1 tab by mouth once daily for 2 weeks, then decrease to 25mg  every other day 30 tablet 0  . spironolactone (ALDACTONE) 25 MG tablet Take 1 tablet (25 mg total) by mouth daily. 90 tablet 1   No current facility-administered medications for this visit.     Past Medical History:  Diagnosis  Date  . Asthma    Hx of childhood asthma, disappeared for a while, then resurfaced 6-7 years ago.   . Cardiomyopathy    with a negative cardiac catheterization in the past. (EF appriximately 40-45%)   . HTN (hypertension)    x 30 years  . Obesity, unspecified   . Sleep apnea    CPAP  . 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 None      ROS:  As stated in the HPI and negative for all other systems.  PHYSICAL EXAM BP 130/79   Pulse 61   Temp (!) 96.1 F (35.6 C)   Ht 6' (1.829 m)   Wt 256 lb (116.1 kg)   SpO2 94%   BMI 34.72 kg/m   GENERAL:  Well appearing NECK:  No jugular venous distention, waveform within normal limits, carotid upstroke brisk and symmetric, no  bruits, no thyromegaly LUNGS:  Clear to auscultation bilaterally CHEST:  Unremarkable HEART:  PMI not displaced or sustained,S1 and S2 within normal limits, no S3, no S4, no clicks, no rubs, no murmurs ABD:  Flat, positive bowel sounds normal in frequency in pitch, no bruits, no rebound, no guarding, no midline pulsatile mass, no hepatomegaly, no splenomegaly EXT:  2 plus pulses throughout, bilateral leg edema moderate, no cyanosis no clubbing   EKG:   Sinus rhythm, rate 63, axis within normal limits, RBBB (new since previous.) no acute ST-T wave changes.   10/03/2018   ASSESSMENT AND PLAN   CARDIOMYOPATHY -  EF was 45% in 2018.  Given his mild diffuse edema with what appears to be some increased lower extremity edema I will check another echocardiogram.  Getting him Lasix 20 mg for the next 3 days.  When he comes back for his echo in about a week and can check a BNP level and basic metabolic profile as he does have some renal insufficiency.  HYPERTENSION -  BP is controlled and he will continue the meds as listed.   OBSTRUCTIVE SLEEP APNEA -  He wears his CPAP regularly.  No change in therapy.   OBESITY, UNSPECIFIED -  Thankfully his weights not gone up but is not gone down and he and I talked briefly about this.   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   CKD III He does have some mild renal insufficiency and I will follow his basic metabolic profile as above.

## 2018-12-22 ENCOUNTER — Other Ambulatory Visit: Payer: Self-pay

## 2018-12-22 ENCOUNTER — Ambulatory Visit: Payer: Medicare Other | Admitting: Cardiology

## 2018-12-22 ENCOUNTER — Encounter: Payer: Self-pay | Admitting: Cardiology

## 2018-12-22 VITALS — BP 130/79 | HR 61 | Temp 96.1°F | Ht 72.0 in | Wt 256.0 lb

## 2018-12-22 DIAGNOSIS — I509 Heart failure, unspecified: Secondary | ICD-10-CM | POA: Diagnosis not present

## 2018-12-22 DIAGNOSIS — G4733 Obstructive sleep apnea (adult) (pediatric): Secondary | ICD-10-CM | POA: Diagnosis not present

## 2018-12-22 DIAGNOSIS — N1831 Chronic kidney disease, stage 3a: Secondary | ICD-10-CM

## 2018-12-22 DIAGNOSIS — I10 Essential (primary) hypertension: Secondary | ICD-10-CM

## 2018-12-22 DIAGNOSIS — I428 Other cardiomyopathies: Secondary | ICD-10-CM | POA: Diagnosis not present

## 2018-12-22 MED ORDER — FUROSEMIDE 20 MG PO TABS: 20.0000 mg | ORAL_TABLET | Freq: Every day | ORAL | 0 refills | Status: DC

## 2018-12-22 NOTE — Patient Instructions (Signed)
Medication Instructions:  Take 20mg  Lasix once daily for 3 days.  If you need a refill on your cardiac medications before your next appointment, please call your pharmacy.   Lab work: Come back in 10 days and get labs drawn. BNP, BMET If you have labs (blood work) drawn today and your tests are completely normal, you will receive your results only by: [place] (if you have MyChart) OR A paper copy in the mail If you have any lab test that is abnormal or we need to change your treatment, we will call you to review the results.  Testing/Procedures: Your physician has requested that you have an echocardiogram. Echocardiography is a painless test that uses sound waves to create images of your heart. It provides your doctor with information about the size and shape of your heart and how well your heart's chambers and valves are working. This procedure takes approximately one hour. There are no restrictions for this procedure. [place] [address]   Follow-Up: At [facility], you and your health needs are our priority.  As part of our continuing mission to provide you with exceptional heart care, we have created designated Provider Care Teams.  These Care Teams include your primary Cardiologist (physician) and Advanced Practice Providers (APPs -  Physician Assistants and Nurse Practitioners) who all work together to provide you with the care you need, when you need it. You will need a follow up appointment in 2 months.  Please call our office 2 months in advance to schedule this appointment.  You may see Minus Breeding, MD or one of the following Advanced Practice Providers on your designated Care Team:   Rosaria Ferries, PA-C Jory Sims, DNP, ANP

## 2018-12-29 LAB — BASIC METABOLIC PANEL
BUN/Creatinine Ratio: 14 (ref 10–24)
BUN: 18 mg/dL (ref 8–27)
CO2: 21 mmol/L (ref 20–29)
Calcium: 9.7 mg/dL (ref 8.6–10.2)
Chloride: 106 mmol/L (ref 96–106)
Creatinine, Ser: 1.3 mg/dL — ABNORMAL HIGH (ref 0.76–1.27)
GFR calc Af Amer: 61 mL/min/{1.73_m2} (ref >59)
GFR calc non Af Amer: 53 mL/min/{1.73_m2} — ABNORMAL LOW (ref >59)
Glucose: 117 mg/dL — ABNORMAL HIGH (ref 65–99)
Potassium: 4.7 mmol/L (ref 3.5–5.2)
Sodium: 139 mmol/L (ref 134–144)

## 2018-12-29 LAB — BRAIN NATRIURETIC PEPTIDE: BNP: 141.1 pg/mL — ABNORMAL HIGH (ref 0.0–100.0)

## 2019-01-02 ENCOUNTER — Other Ambulatory Visit: Payer: Self-pay

## 2019-01-02 ENCOUNTER — Encounter (INDEPENDENT_AMBULATORY_CARE_PROVIDER_SITE_OTHER): Payer: Self-pay

## 2019-01-02 ENCOUNTER — Ambulatory Visit (HOSPITAL_COMMUNITY): Payer: Medicare Other | Attending: Cardiology

## 2019-01-02 DIAGNOSIS — I509 Heart failure, unspecified: Secondary | ICD-10-CM | POA: Insufficient documentation

## 2019-01-03 ENCOUNTER — Telehealth: Payer: Self-pay

## 2019-01-03 NOTE — Telephone Encounter (Signed)
No answer.  No voicemail setup

## 2019-01-03 NOTE — Telephone Encounter (Signed)
-----   Message from Minus Breeding, MD sent at 12/31/2018  2:01 PM EDT ----- BNP is very mildly elevated.  Creat is slightly elevated but stable.  No change in therapy.  Call Mr. Martinique with the results and send results to Debbrah Alar, NP

## 2019-01-03 NOTE — Telephone Encounter (Signed)
-----   Message from Minus Breeding, MD sent at 01/03/2019 11:17 AM EDT ----- Call the patient.  The EF looks about the same which is good news.  No change in therapy.  Call Mr. Martinique with the results and send results to Debbrah Alar, NP

## 2019-01-05 ENCOUNTER — Telehealth: Payer: Self-pay

## 2019-01-05 NOTE — Telephone Encounter (Signed)
-----   Message from James Hochrein, MD sent at 01/03/2019 11:17 AM EDT ----- Call the patient.  The EF looks about the same which is good news.  No change in therapy.  Call Mr. Welcome with the results and send results to O'Sullivan, [name], NP 

## 2019-01-05 NOTE — Telephone Encounter (Signed)
Advised pt of labs and echo. Verbalized understanding. Routed to PCP. Pt stated he was swelling in his hands and legs. Stated it was painful. Asked the pt of he was taking his prescribed medications, pt agreed. Asked if the pt was feeling SOB or any chest pain, denied. Will forward to Dr. Percival Spanish for review.

## 2019-01-08 ENCOUNTER — Telehealth: Payer: Self-pay

## 2019-01-08 NOTE — Telephone Encounter (Signed)
LM2CB 

## 2019-01-08 NOTE — Telephone Encounter (Signed)
Follow Up  Patient is calling back in to follow up and to make sure that message was sent to Dr. Percival Spanish and would like a call back . Please give patient a call back.

## 2019-01-09 NOTE — Telephone Encounter (Signed)
LM2CB to set up virtual appt 12/6 with Dr Percival Spanish.

## 2019-01-10 ENCOUNTER — Telehealth: Payer: Self-pay | Admitting: Cardiology

## 2019-01-10 NOTE — Telephone Encounter (Signed)
LM2CB 

## 2019-01-10 NOTE — Telephone Encounter (Signed)
° ° °  Pt c/o swelling: STAT is pt has developed SOB within 24 hours  1) How much weight have you gained and in what time span?   2) If swelling, where is the swelling located? HANDS, FEET  3) Are you currently taking a fluid pill? YES  4) Are you currently SOB? NO  5) Do you have a log of your daily weights (if so, list)?   6) Have you gained 3 pounds in a day or 5 pounds in a week?  7) Have you traveled recently? NO

## 2019-01-10 NOTE — Telephone Encounter (Signed)
Returned call to patient's wife. She reports his hands are swelling, left foot > right swelling  Reports she/husband have been in communication with Lonn Georgia this week concerning this same matter. Cannot see any response from MD in epic about this  Routed to primary nurse

## 2019-01-11 ENCOUNTER — Other Ambulatory Visit: Payer: Self-pay

## 2019-01-12 ENCOUNTER — Ambulatory Visit (INDEPENDENT_AMBULATORY_CARE_PROVIDER_SITE_OTHER): Payer: Medicare Other | Admitting: Family

## 2019-01-12 ENCOUNTER — Other Ambulatory Visit: Payer: Self-pay | Admitting: Orthopaedic Surgery

## 2019-01-12 ENCOUNTER — Encounter: Payer: Self-pay | Admitting: Family

## 2019-01-12 VITALS — BP 112/71 | HR 80 | Temp 96.8°F | Resp 16 | Ht 72.0 in | Wt 252.0 lb

## 2019-01-12 DIAGNOSIS — R609 Edema, unspecified: Secondary | ICD-10-CM

## 2019-01-12 DIAGNOSIS — M2550 Pain in unspecified joint: Secondary | ICD-10-CM

## 2019-01-12 DIAGNOSIS — M545 Low back pain, unspecified: Secondary | ICD-10-CM

## 2019-01-12 DIAGNOSIS — M25511 Pain in right shoulder: Secondary | ICD-10-CM

## 2019-01-12 DIAGNOSIS — M1990 Unspecified osteoarthritis, unspecified site: Secondary | ICD-10-CM | POA: Diagnosis not present

## 2019-01-12 DIAGNOSIS — D649 Anemia, unspecified: Secondary | ICD-10-CM | POA: Diagnosis not present

## 2019-01-12 LAB — COMPREHENSIVE METABOLIC PANEL
ALT: 8 U/L (ref 0–53)
AST: 9 U/L (ref 0–37)
Albumin: 3.2 g/dL — ABNORMAL LOW (ref 3.5–5.2)
Alkaline Phosphatase: 58 U/L (ref 39–117)
BUN: 20 mg/dL (ref 6–23)
CO2: 24 mEq/L (ref 19–32)
Calcium: 9.6 mg/dL (ref 8.4–10.5)
Chloride: 105 mEq/L (ref 96–112)
Creatinine, Ser: 1.23 mg/dL (ref 0.40–1.50)
GFR: 68.85 mL/min (ref >60.00)
Glucose, Bld: 105 mg/dL — ABNORMAL HIGH (ref 70–99)
Potassium: 3.8 mEq/L (ref 3.5–5.1)
Sodium: 137 mEq/L (ref 135–145)
Total Bilirubin: 0.5 mg/dL (ref 0.2–1.2)
Total Protein: 6.6 g/dL (ref 6.0–8.3)

## 2019-01-12 LAB — CBC WITH DIFFERENTIAL/PLATELET
Basophils Absolute: 0.1 10*3/uL (ref 0.0–0.1)
Basophils Relative: 0.7 % (ref 0.0–3.0)
Eosinophils Absolute: 0.1 10*3/uL (ref 0.0–0.7)
Eosinophils Relative: 1.7 % (ref 0.0–5.0)
HCT: 30.3 % — ABNORMAL LOW (ref 39.0–52.0)
Hemoglobin: 10 g/dL — ABNORMAL LOW (ref 13.0–17.0)
Lymphocytes Relative: 18.7 % (ref 12.0–46.0)
Lymphs Abs: 1.4 10*3/uL (ref 0.7–4.0)
MCHC: 33.2 g/dL (ref 30.0–36.0)
MCV: 91.3 fl (ref 78.0–100.0)
Monocytes Absolute: 0.9 10*3/uL (ref 0.1–1.0)
Monocytes Relative: 11.5 % (ref 3.0–12.0)
Neutro Abs: 5.1 10*3/uL (ref 1.4–7.7)
Neutrophils Relative %: 67.4 % (ref 43.0–77.0)
Platelets: 437 10*3/uL — ABNORMAL HIGH (ref 150.0–400.0)
RBC: 3.32 Mil/uL — ABNORMAL LOW (ref 4.22–5.81)
RDW: 15.4 % (ref 11.5–15.5)
WBC: 7.6 10*3/uL (ref 4.0–10.5)

## 2019-01-12 LAB — SEDIMENTATION RATE: Sed Rate: 61 mm/hr — ABNORMAL HIGH (ref 0–20)

## 2019-01-12 LAB — TSH: TSH: 1.06 u[IU]/mL (ref 0.35–4.50)

## 2019-01-12 LAB — URIC ACID: Uric Acid, Serum: 7.8 mg/dL (ref 4.0–7.8)

## 2019-01-12 MED ORDER — FUROSEMIDE 20 MG PO TABS: 20.0000 mg | ORAL_TABLET | Freq: Every day | ORAL | 3 refills | Status: DC | PRN

## 2019-01-12 NOTE — Telephone Encounter (Signed)
Left voicemails to pt in the last week. Never answered. Will forward to Dr Percival Spanish for review.

## 2019-01-12 NOTE — Patient Instructions (Signed)
Please complete lab work prior to leaving.   

## 2019-01-12 NOTE — Progress Notes (Signed)
Subjective:    Patient ID: Brandon [name], male    DOB: [birth year], 78 y.o.   MRN: [id number]  HPI  Patient is a 78 yr old male who presents today with c/o swelling in hands and feet.  He reports associated aching in shoulders, arms, hips and legs. Denies associated fever. Reports that symptoms have been present for several months but have worsened recently.     Review of Systems See HPI  Past Medical History:  Diagnosis Date  . Asthma    Hx of childhood asthma, disappeared for a while, then resurfaced 6-7 years ago.   . Cardiomyopathy    with a negative cardiac catheterization in the past. (EF appriximately 40-45%)   . HTN (hypertension)    x 30 years  . Obesity, unspecified   . Sleep apnea    CPAP  . 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 Marital status: Married    Spouse name: Tonia Ghent  . Number of children: 3  . Years of education: Not on file  . Highest education level: Not on file  Occupational History  . Occupation: retired  Scientific laboratory technician  . Financial resource strain: Not on file  . Food insecurity    Worry: Not on file    Inability: Not on file  . Transportation needs    Medical: Not on file    Non-medical: Not on file  Tobacco Use  . Smoking status: Former Research scientist (life sciences)  . Smokeless tobacco: Never Used  . Tobacco comment: quit smoling in June 02, 1988. started when 18, 1 ppd  Substance and Sexual Activity  . Alcohol use: Yes    Comment: occasionaly   . Drug use: Not on file  . Sexual activity: Not on file  Lifestyle  . Physical activity    Days per week: Not on file    Minutes per session: Not on file  . Stress: Not on file  Relationships  . Social Herbalist on phone: Not on file    Gets together: Not on file    Attends religious service: Not on file    Active member of club or organization: Not on file    Attends meetings of clubs or organizations: Not on file    Relationship  status: Not on file  . Intimate partner violence    Fear of current or ex partner: Not on file    Emotionally abused: Not on file    Physically abused: Not on file    Forced sexual activity: Not on file  Other Topics Concern  . Not on file  Social History Narrative   Grew up in [place], attended [employer]. First wife died of breast ca in 06-02-1997. 3 children. Remarried- 8 years. 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    Pt signed designated party release granting access to [facility] to his wife Tonia Ghent. Detailed message may be left on home or cell phone. Shanon Payor August 13, 2009 11:34 am.     Past Surgical History:  Procedure Laterality Date  . None      Family History  Problem Relation Age of Onset  . Cancer Father        multiple melanoma  . Lupus Sister   . Asthma Daughter     Allergies  Allergen Reactions  . Ace Inhibitors     REACTION: cough  . Isosorb Dinitrate-Hydralazine  REACTION: dizziness/hypotensive    Current Outpatient Medications on File Prior to Visit  Medication Sig Dispense Refill  . amLODipine (NORVASC) 10 MG tablet Take 1 tablet (10 mg total) by mouth daily. 90 tablet 1  . aspirin 81 MG tablet Take 81 mg by mouth daily.      . celecoxib (CELEBREX) 100 MG capsule Take 100 mg by mouth 2 (two) times daily.    . diazepam (VALIUM) 5 MG tablet Take 1 tablet by mouth 60 minutes prior to MRI.  May take second tablet but only if needed. Do not take with pain medication (hydrocodone or tramadol) 2 tablet 0  . Fluticasone-Salmeterol (ADVAIR) 500-50 MCG/DOSE AEPB INHALE 1 DOSE BY MOUTH TWICE DAILY 60 each 5  . furosemide (LASIX) 20 MG tablet Take 1 tablet (20 mg total) by mouth daily. 3 tablet 0  . gabapentin (NEURONTIN) 100 MG capsule Take 1 capsule (100 mg total) by mouth 3 (three) times daily. 90 capsule 0  . HYDROcodone-acetaminophen (NORCO/VICODIN) 5-325 MG tablet Take 1 tablet by mouth every 6 (six) hours as needed for moderate  pain. 20 tablet 0  . losartan (COZAAR) 50 MG tablet Take 1 tablet (50 mg total) by mouth 2 (two) times daily. 180 tablet 1  . methylPREDNISolone (MEDROL DOSEPAK) 4 MG TBPK tablet Take per package instructions 21 tablet 0  . metoprolol succinate (TOPROL-XL) 25 MG 24 hr tablet Take 1 tablet (25 mg total) by mouth daily. 90 tablet 1  . montelukast (SINGULAIR) 10 MG tablet Take 1 tablet (10 mg total) by mouth at bedtime. 90 tablet 1  . PROAIR HFA 108 (90 Base) MCG/ACT inhaler INHALE 2 PUFFS BY MOUTH EVERY 6 HOURS AS NEEDED FOR WHEEZING AND FOR SHORTNESS OF BREATH 6.7 g 3  . sertraline (ZOLOFT) 25 MG tablet Takes 1 tab by mouth once daily for 2 weeks, then decrease to 48m every other day 30 tablet 0  . spironolactone (ALDACTONE) 25 MG tablet Take 1 tablet (25 mg total) by mouth daily. 90 tablet 1   No current facility-administered medications on file prior to visit.     BP 112/71 (BP Location: Left Arm, Patient Position: Sitting, Cuff Size: Large)   Pulse 80   Temp (!) 96.8 F (36 C) (Temporal)   Resp 16   Ht 6' (1.829 m)   Wt 252 lb (114.3 kg)   SpO2 99%   BMI 34.18 kg/m       Objective:   Physical Exam Constitutional:      General: He is not in acute distress.    Appearance: He is well-developed.  HENT:     Head: Normocephalic and atraumatic.  Cardiovascular:     Rate and Rhythm: Normal rate and regular rhythm.     Heart sounds: No murmur.  Pulmonary:     Effort: Pulmonary effort is normal. No respiratory distress.     Breath sounds: Normal breath sounds. No wheezing or rales.  Musculoskeletal:     Right lower leg: 2+ Edema present.     Left lower leg: 2+ Edema present.     Comments: Significant bilateral hand swelling R>L  Skin:    General: Skin is warm and dry.  Neurological:     Mental Status: He is alert and oriented to person, place, and time.  Psychiatric:        Behavior: Behavior normal.        Thought Content: Thought content normal.           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 Rx sent for empiric colchicine.  Also sent ESR, ANA and RA. ANA is still pending, however ESR and RA are significantly elevated. Will treat with a prednisone taper and also will refer to rheumatology for further evaluation.   Anemia-  Lab Results  Component Value Date   WBC 7.6 01/12/2019   HGB 10.0 (L) 01/12/2019   HCT 30.3 (L) 01/12/2019   MCV 91.3 01/12/2019   PLT 437.0 (H) 01/12/2019   This is a new finding. Check b12, folate, iron and IFOB to further evaluate.

## 2019-01-13 ENCOUNTER — Telehealth: Payer: Self-pay | Admitting: Family

## 2019-01-13 DIAGNOSIS — M2550 Pain in unspecified joint: Secondary | ICD-10-CM

## 2019-01-13 MED ORDER — COLCHICINE 0.6 MG PO TABS: ORAL_TABLET | ORAL | 0 refills | Status: DC

## 2019-01-13 MED ORDER — PREDNISONE 10 MG PO TABS: ORAL_TABLET | ORAL | 0 refills | Status: DC

## 2019-01-13 NOTE — Telephone Encounter (Signed)
Reviewed lab work.  Pt is quite anemic.  ESR very elevated Rheumatoid factor elevated.  Upper limit normal of uric acid.    Will plan to refer pt to rheumatology, empiric rx with colchicine, pred taper for acute flare of ? RA, +/- gout.   Rod Holler please advise pt re: above and also ask him to complete IFOB, dx anemia.    Is it possible to add on serum iron, b12 and folate to his lab work. Dx anemia?   I would like to see him back in the office in 1 month please.   Attempted to reach patient at both numbers. No answer, pt does not have voicemail.

## 2019-01-14 NOTE — Telephone Encounter (Signed)
We can see if we can get in touch with him this week.  He might need Lasix.

## 2019-01-15 ENCOUNTER — Other Ambulatory Visit: Payer: Self-pay

## 2019-01-15 DIAGNOSIS — D649 Anemia, unspecified: Secondary | ICD-10-CM

## 2019-01-15 NOTE — Telephone Encounter (Signed)
I spoke to pt and reviewed results and plan as below. He verbalizes understanding.  Rod Holler can you please do the following:  1) push his follow up out 10 days 2) see if lab can add on b12, serum iron, folate to his labs. 3) arrange pick up of IFOB kit.

## 2019-01-15 NOTE — Telephone Encounter (Signed)
Called pt back about concerns with swelling. Stated he went to his primary care last week and they gave him furosemide 20mg  PRN. They also gave him cochicine for his gout. Pt states that he is about to start taking the lasix to see if his swelling goes down. His primary has put in a f/u appt and labs for 11/6. Will forward to Dr Percival Spanish for review.

## 2019-01-15 NOTE — Telephone Encounter (Signed)
Patient's follow up appointment moved to 01-30-2019.  Per [place] harvest labs can not be added due to light exposure. Patient scheduled to come in tomorrow for labs, he will pick up the IFOB kit while in the office tomorrow.

## 2019-01-15 NOTE — Telephone Encounter (Signed)
Agree with PRN Lasix.

## 2019-01-16 ENCOUNTER — Other Ambulatory Visit: Payer: Medicare Other

## 2019-01-16 LAB — ANA: Anti Nuclear Antibody (ANA): POSITIVE — AB

## 2019-01-16 LAB — ANTI-NUCLEAR AB-TITER (ANA TITER): ANA Titer 1: 1:40 {titer} — ABNORMAL HIGH

## 2019-01-16 LAB — RHEUMATOID FACTOR: Rheumatoid fact SerPl-aCnc: 20 IU/mL — ABNORMAL HIGH (ref <14)

## 2019-01-19 ENCOUNTER — Ambulatory Visit: Payer: Medicare Other | Admitting: Family

## 2019-01-22 ENCOUNTER — Other Ambulatory Visit: Payer: Self-pay

## 2019-01-22 ENCOUNTER — Other Ambulatory Visit (INDEPENDENT_AMBULATORY_CARE_PROVIDER_SITE_OTHER): Payer: Medicare Other

## 2019-01-22 DIAGNOSIS — D649 Anemia, unspecified: Secondary | ICD-10-CM

## 2019-01-22 LAB — B12 AND FOLATE PANEL
Folate: 5.7 ng/mL — ABNORMAL LOW (ref >5.9)
Vitamin B-12: 300 pg/mL (ref 211–911)

## 2019-01-22 LAB — IRON: Iron: 76 ug/dL (ref 42–165)

## 2019-01-23 ENCOUNTER — Telehealth: Payer: Self-pay | Admitting: Family

## 2019-01-23 MED ORDER — B COMPLEX PO TABS: 1.0000 | ORAL_TABLET | Freq: Every day | ORAL | Status: DC

## 2019-01-23 NOTE — Telephone Encounter (Signed)
Patient advised of results and to start otc b complex

## 2019-01-23 NOTE — Telephone Encounter (Signed)
b12 low normal, folate low. Add b complex vitamin once daily otc

## 2019-01-24 ENCOUNTER — Telehealth: Payer: Self-pay | Admitting: Family

## 2019-01-24 NOTE — Telephone Encounter (Signed)
I would recommend a b complex vitamin (it has multiple vitamins in it).  [employer]'s [employer] is a good brand OTC.

## 2019-01-24 NOTE — Telephone Encounter (Signed)
Patient would like to know if what ML / micrograms PCP would like him to start on concerning his b12, please advise

## 2019-01-24 NOTE — Telephone Encounter (Signed)
Patient notified of what to get.

## 2019-01-30 ENCOUNTER — Encounter: Payer: Self-pay | Admitting: Family

## 2019-01-30 ENCOUNTER — Other Ambulatory Visit: Payer: Self-pay

## 2019-01-30 ENCOUNTER — Ambulatory Visit (INDEPENDENT_AMBULATORY_CARE_PROVIDER_SITE_OTHER): Payer: Medicare Other | Admitting: Family

## 2019-01-30 VITALS — BP 122/72 | HR 70 | Temp 96.6°F | Resp 16 | Ht 72.0 in | Wt 252.0 lb

## 2019-01-30 DIAGNOSIS — R350 Frequency of micturition: Secondary | ICD-10-CM

## 2019-01-30 DIAGNOSIS — M1990 Unspecified osteoarthritis, unspecified site: Secondary | ICD-10-CM

## 2019-01-30 DIAGNOSIS — R351 Nocturia: Secondary | ICD-10-CM

## 2019-01-30 MED ORDER — DIAZEPAM 5 MG PO TABS: ORAL_TABLET | ORAL | 0 refills | Status: DC

## 2019-01-30 MED ORDER — TAMSULOSIN HCL 0.4 MG PO CAPS: 0.4000 mg | ORAL_CAPSULE | Freq: Every day | ORAL | 3 refills | Status: DC

## 2019-01-30 NOTE — Patient Instructions (Signed)
Please complete lab work prior to leaving.   

## 2019-01-30 NOTE — Progress Notes (Signed)
Subjective:    Patient ID: Brandon [name], male    DOB: [birth year], 78 y.o.   MRN: [id number]  HPI  Patient is a 78 yr old male who presents today for follow up.   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  ESR, ANA, RA were all elevated. We treated him with a prednisone taper. He noted brief improvement during this treatment.  He is scheduled to see rheumatology on 04/03/19.   Anemia- pt was noted to be anemic last visit and returned his IFOB kit today.  His b12 was low normal and folate mildly reduced. We had him add a bcomplex vitamin once daily.    Lab Results  Component Value Date   PSA 1.34 08/13/2009    Notes some nocturia. Feels as though he has incomplete bladder emptying.    Review of Systems See HPI  Past Medical History:  Diagnosis Date  . Asthma    Hx of childhood asthma, disappeared for a while, then resurfaced 6-7 years ago.   . Cardiomyopathy    with a negative cardiac catheterization in the past. (EF appriximately 40-45%)   . HTN (hypertension)    x 30 years  . Obesity, unspecified   . Sleep apnea    CPAP  . 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 Marital status: Married    Spouse name: Tonia Ghent  . Number of children: 3  . Years of education: Not on file  . Highest education level: Not on file  Occupational History  . Occupation: retired  Scientific laboratory technician  . Financial resource strain: Not on file  . Food insecurity    Worry: Not on file    Inability: Not on file  . Transportation needs    Medical: Not on file    Non-medical: Not on file  Tobacco Use  . Smoking status: Former Research scientist (life sciences)  . Smokeless tobacco: Never Used  . Tobacco comment: quit smoling in 05-13-1988. started when 18, 1 ppd  Substance and Sexual Activity  . Alcohol use: Yes    Comment: occasionaly   . Drug use: Not on file  . Sexual activity: Not on file  Lifestyle   . Physical activity    Days per week: Not on file    Minutes per session: Not on file  . Stress: Not on file  Relationships  . Social Herbalist on phone: Not on file    Gets together: Not on file    Attends religious service: Not on file    Active member of club or organization: Not on file    Attends meetings of clubs or organizations: Not on file    Relationship status: Not on file  . Intimate partner violence    Fear of current or ex partner: Not on file    Emotionally abused: Not on file    Physically abused: Not on file    Forced sexual activity: Not on file  Other Topics Concern  . Not on file  Social History Narrative   Grew up in [place], attended [employer]. First wife died of breast ca in 13-May-1997. 3 children. Remarried- 8 years. 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    Pt signed designated party release granting access to [facility] to his wife Tonia Ghent. Detailed message may be left on home or cell phone.  Shanon Payor August 13, 2009 11:34 am.     Past Surgical History:  Procedure Laterality Date  . None      Family History  Problem Relation Age of Onset  . Cancer Father        multiple melanoma  . Lupus Sister        died at 85  . Diabetes Mellitus II Sister        died from covid-19  . Asthma Daughter   . Neuropathy Sister     Allergies  Allergen Reactions  . Ace Inhibitors     REACTION: cough  . Isosorb Dinitrate-Hydralazine     REACTION: dizziness/hypotensive    Current Outpatient Medications on File Prior to Visit  Medication Sig Dispense Refill  . amLODipine (NORVASC) 10 MG tablet Take 1 tablet (10 mg total) by mouth daily. 90 tablet 1  . aspirin 81 MG tablet Take 81 mg by mouth daily.      Marland Kitchen b complex vitamins tablet Take 1 tablet by mouth daily.    . celecoxib (CELEBREX) 100 MG capsule Take 100 mg by mouth 2 (two) times daily.    . colchicine 0.6 MG tablet Take 2 tablets by mouth now and 1 tablet one hour  later. 3 tablet 0  . diazepam (VALIUM) 5 MG tablet Take 1 tablet by mouth 60 minutes prior to MRI.  May take second tablet but only if needed. Do not take with pain medication (hydrocodone or tramadol) 2 tablet 0  . Fluticasone-Salmeterol (ADVAIR) 500-50 MCG/DOSE AEPB INHALE 1 DOSE BY MOUTH TWICE DAILY 60 each 5  . furosemide (LASIX) 20 MG tablet Take 1 tablet (20 mg total) by mouth daily as needed. 30 tablet 3  . gabapentin (NEURONTIN) 100 MG capsule Take 1 capsule (100 mg total) by mouth 3 (three) times daily. 90 capsule 0  . HYDROcodone-acetaminophen (NORCO/VICODIN) 5-325 MG tablet Take 1 tablet by mouth every 6 (six) hours as needed for moderate pain. 20 tablet 0  . losartan (COZAAR) 50 MG tablet Take 1 tablet (50 mg total) by mouth 2 (two) times daily. 180 tablet 1  . methylPREDNISolone (MEDROL DOSEPAK) 4 MG TBPK tablet Take per package instructions 21 tablet 0  . metoprolol succinate (TOPROL-XL) 25 MG 24 hr tablet Take 1 tablet (25 mg total) by mouth daily. 90 tablet 1  . montelukast (SINGULAIR) 10 MG tablet Take 1 tablet (10 mg total) by mouth at bedtime. 90 tablet 1  . predniSONE (DELTASONE) 10 MG tablet 4 tabs by mouth once daily for 3 days, then 3 tabs daily x 3 days, then 2 tabs daily x 3 days, then 1 tab daily x 3days 30 tablet 0  . PROAIR HFA 108 (90 Base) MCG/ACT inhaler INHALE 2 PUFFS BY MOUTH EVERY 6 HOURS AS NEEDED FOR WHEEZING AND FOR SHORTNESS OF BREATH 6.7 g 3  . sertraline (ZOLOFT) 25 MG tablet Takes 1 tab by mouth once daily for 2 weeks, then decrease to 34m every other day 30 tablet 0  . spironolactone (ALDACTONE) 25 MG tablet Take 1 tablet (25 mg total) by mouth daily. 90 tablet 1   No current facility-administered medications on file prior to visit.     BP 122/72 (BP Location: Right Arm, Patient Position: Sitting, Cuff Size: Large)   Pulse 70   Temp (!) 96.6 F (35.9 C) (Temporal)   Resp 16   Ht 6' (1.829 m)   Wt 252 lb (114.3 kg)   SpO2 100%   BMI 34.18  kg/m        Objective:   Physical Exam Constitutional:      General: He is not in acute distress.    Appearance: He is well-developed.  HENT:     Head: Normocephalic and atraumatic.  Cardiovascular:     Rate and Rhythm: Normal rate and regular rhythm.     Heart sounds: No murmur.  Pulmonary:     Effort: Pulmonary effort is normal. No respiratory distress.     Breath sounds: Normal breath sounds. No wheezing or rales.  Musculoskeletal:     Comments: + swelling of right hand and wrist. Slightly better than last visit   2+ Bilateral LE edema  Skin:    General: Skin is warm and dry.  Neurological:     Mental Status: He is alert and oriented to person, place, and time.  Psychiatric:        Behavior: Behavior normal.        Thought Content: Thought content normal.            Assessment & Plan:  Inflammatory arthritis- Has + ESR, + ANA, elevated rheumatoid factor.  Some brief improvement with steroid courses. Has appointment with rheumatology in January. I will reach out to Dr. Estanislado Pandy to see if she may have some earlier openings.    Anemia- folate deficiency. Continue bcomplex and await IFOB results.  Nocturia- likely related to BPH. Trial of flomax.   Check PSA.

## 2019-01-31 ENCOUNTER — Other Ambulatory Visit (INDEPENDENT_AMBULATORY_CARE_PROVIDER_SITE_OTHER): Payer: Medicare Other

## 2019-01-31 DIAGNOSIS — D649 Anemia, unspecified: Secondary | ICD-10-CM

## 2019-01-31 LAB — URINALYSIS, ROUTINE W REFLEX MICROSCOPIC
Bilirubin Urine: NEGATIVE
Hgb urine dipstick: NEGATIVE
Ketones, ur: NEGATIVE
Leukocytes,Ua: NEGATIVE
Nitrite: NEGATIVE
RBC / HPF: NONE SEEN (ref 0–?)
Specific Gravity, Urine: 1.02 (ref 1.000–1.030)
Total Protein, Urine: NEGATIVE
Urine Glucose: NEGATIVE
Urobilinogen, UA: 0.2 (ref 0.0–1.0)
pH: 6 (ref 5.0–8.0)

## 2019-01-31 LAB — PSA: PSA: 3.4 ng/mL (ref 0.10–4.00)

## 2019-02-01 ENCOUNTER — Encounter: Payer: Self-pay | Admitting: Family

## 2019-02-01 ENCOUNTER — Telehealth: Payer: Self-pay | Admitting: *Deleted

## 2019-02-01 DIAGNOSIS — R195 Other fecal abnormalities: Secondary | ICD-10-CM

## 2019-02-01 HISTORY — DX: Other fecal abnormalities: R19.5

## 2019-02-01 LAB — URINE CULTURE
MICRO NUMBER:: 1109764
SPECIMEN QUALITY:: ADEQUATE

## 2019-02-01 LAB — FECAL OCCULT BLOOD, IMMUNOCHEMICAL: Fecal Occult Bld: POSITIVE — AB

## 2019-02-01 NOTE — Telephone Encounter (Signed)
Received call from Margarita Grizzle at [facility] with positive IFOB result.

## 2019-02-01 NOTE — Telephone Encounter (Signed)
Referral has already been placed!  Thanks.

## 2019-02-01 NOTE — Telephone Encounter (Signed)
Spoke with patient regarding results. Patient verbalized understanding, okay with proceeding with GI referral.

## 2019-02-01 NOTE — Telephone Encounter (Signed)
Please advise pt that his stool is showing blood.  I would like to refer him to GI for further evaluation.

## 2019-02-06 ENCOUNTER — Ambulatory Visit
Admission: RE | Admit: 2019-02-06 | Discharge: 2019-02-06 | Disposition: A | Discharge status: Home / Self Care | Payer: Medicare Other | Source: Ambulatory Visit | Attending: Orthopaedic Surgery | Admitting: Orthopaedic Surgery

## 2019-02-06 DIAGNOSIS — M25511 Pain in right shoulder: Secondary | ICD-10-CM

## 2019-02-06 DIAGNOSIS — M545 Low back pain, unspecified: Secondary | ICD-10-CM

## 2019-02-06 IMAGING — MR MR SHOULDER*R* W/O CM
5 series · 36 of 40 positions shown · non-contrast
Comparison: None.

CLINICAL DATA: Right shoulder pain and limited range of motion for
a few months. No known injury.

EXAM:
MRI OF THE RIGHT SHOULDER WITHOUT CONTRAST
TECHNIQUE: Multiplanar, multisequence MR imaging of the shoulder was performed.
No intravenous contrast was administered.

[Series 4: T1 · oblique · 4.0mm · 0.31mm/px · 4 of 24 slices shown]
[im 1/24]
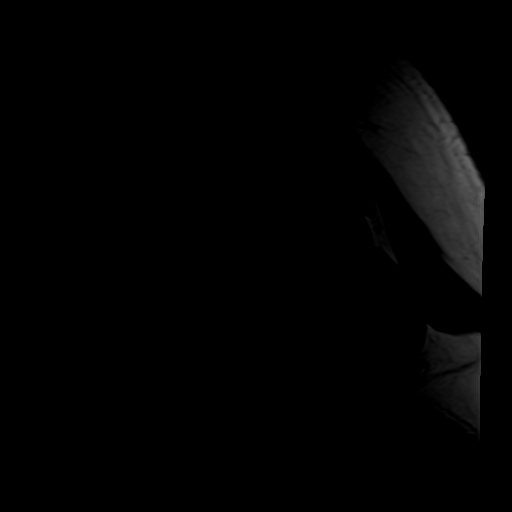
[im 4/24]
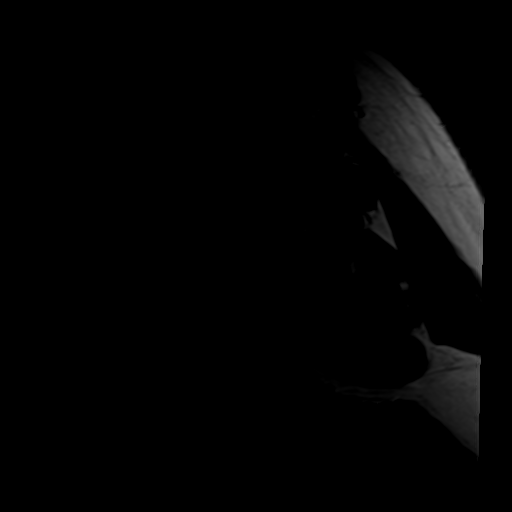
[im 7/24]
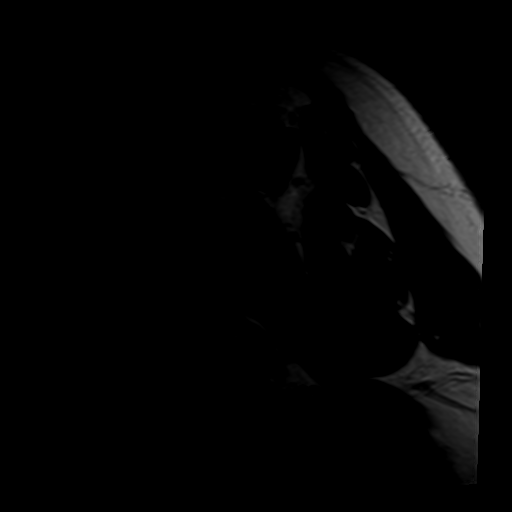
[im 10/24]
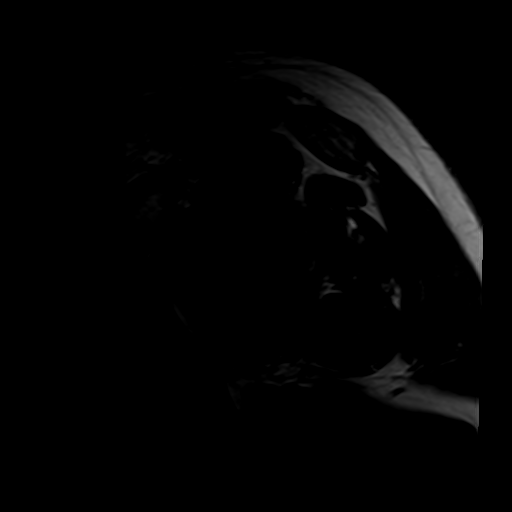

[Series 6: T2 fat-sat · oblique · 4.0mm · 0.62mm/px · 8 of 20 slices shown (1 of 2)]
[im 1/20]
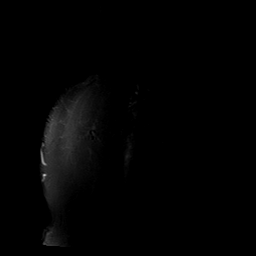
[im 3/20]
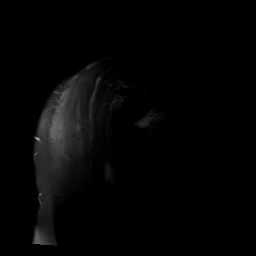
[im 6/20]
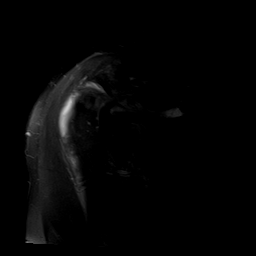
[im 9/20]
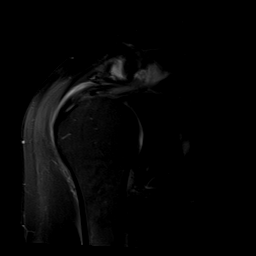
[im 11/20]
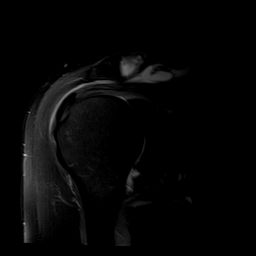
[im 14/20]
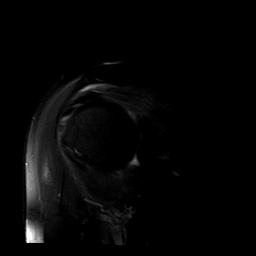
[im 17/20]
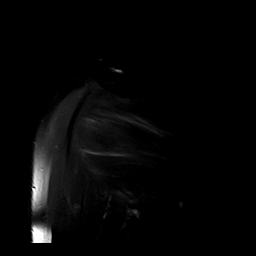
[im 20/20]
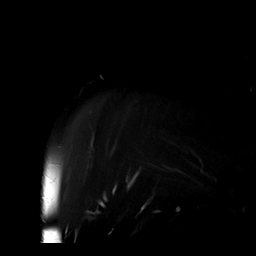

[Series 7: T2 fat-sat · oblique · 4.0mm · 0.62mm/px · 8 of 22 slices shown (2 of 2)]
[im 1/22]
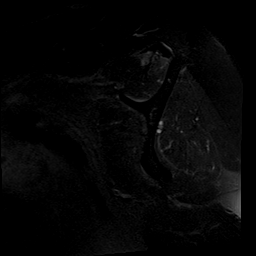
[im 4/22]
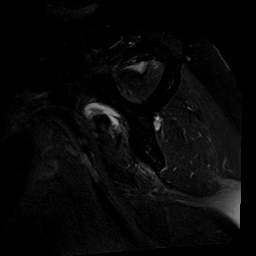
[im 7/22]
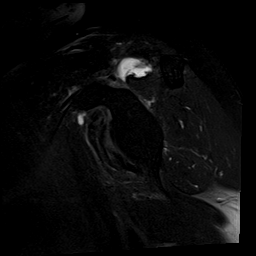
[im 10/22]
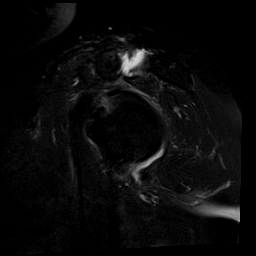
[im 13/22]
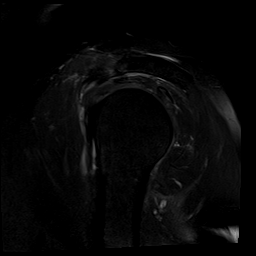
[im 16/22]
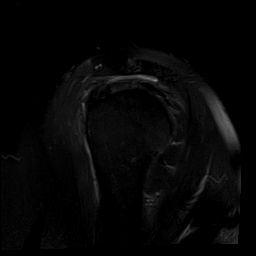
[im 19/22]
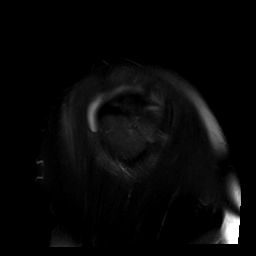
[im 22/22]
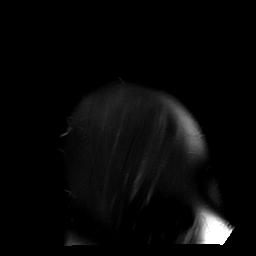

[Series 8: PD fat-sat · oblique · 4.0mm · 0.31mm/px · 8 of 20 slices shown (1 of 2)]
[im 1/20]
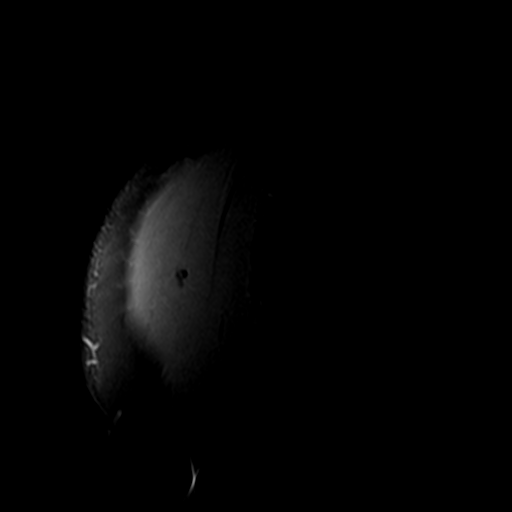
[im 3/20]
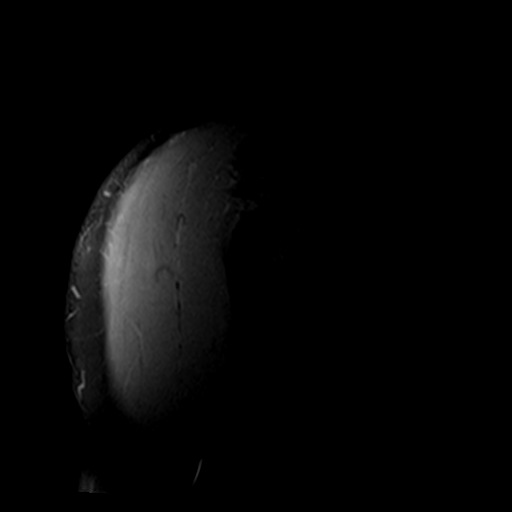
[im 6/20]
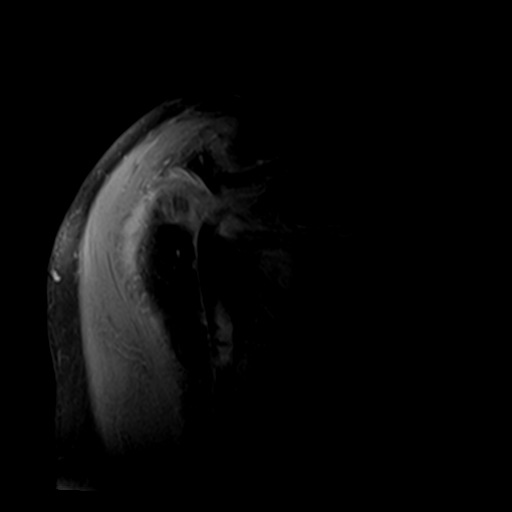
[im 9/20]
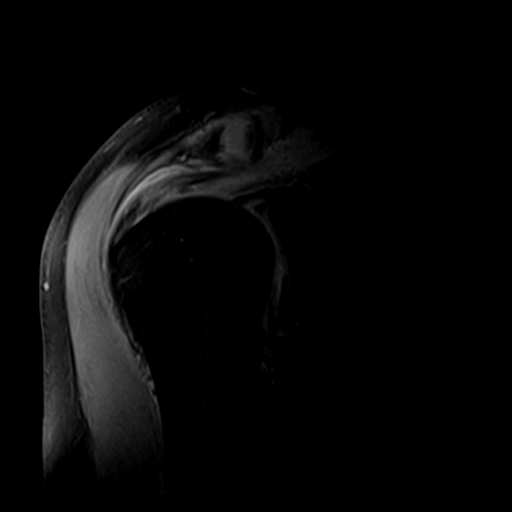
[im 11/20]
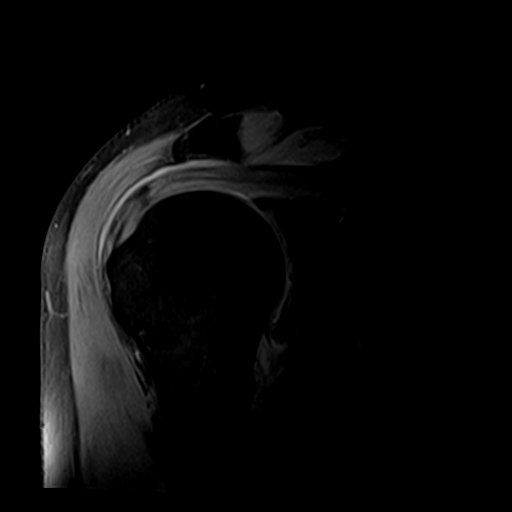
[im 14/20]
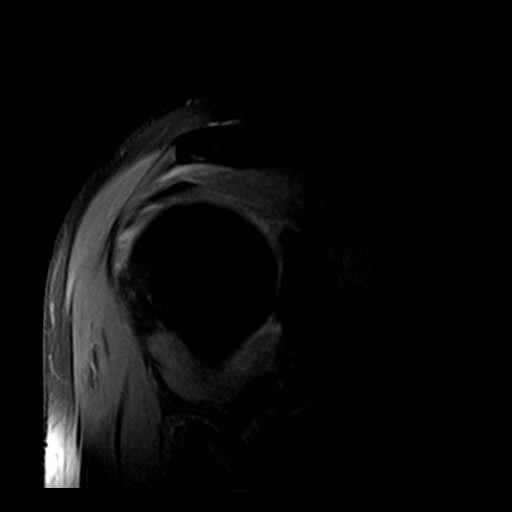
[im 17/20]
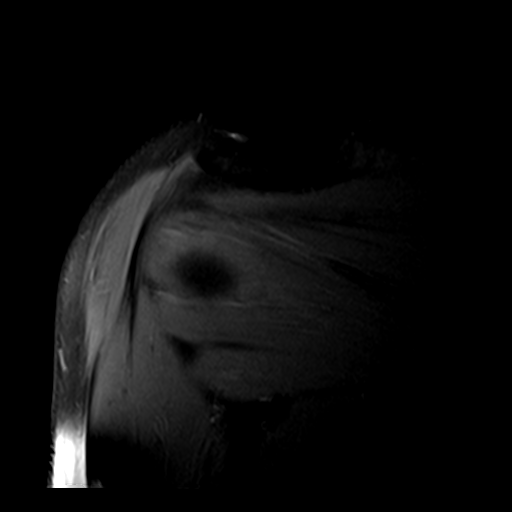
[im 20/20]
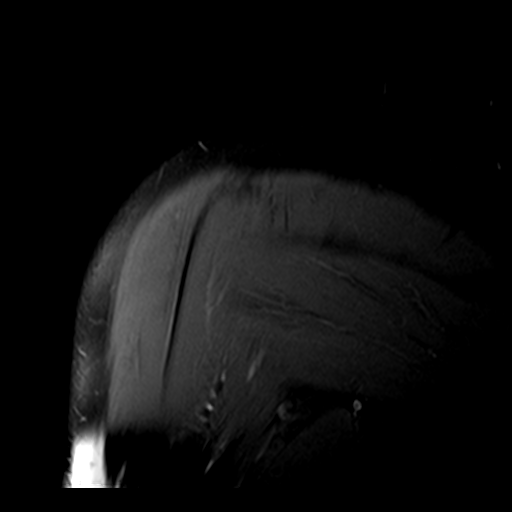

[Series 9: PD fat-sat · axial · 4.0mm · 0.55mm/px · z∈[-7,+76]mm · 8 of 20 slices shown (2 of 2)]
[im 1/20]
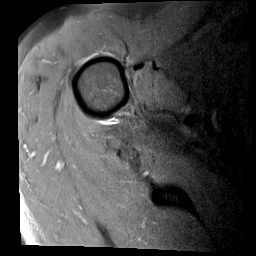
[im 3/20]
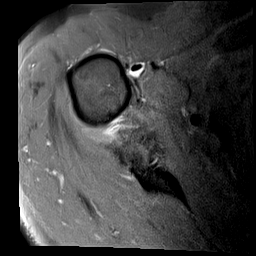
[im 6/20]
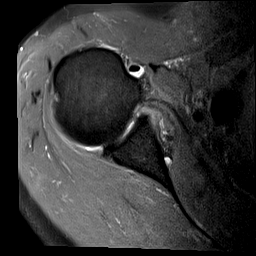
[im 9/20]
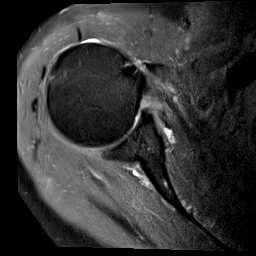
[im 11/20]
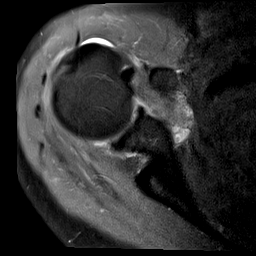
[im 14/20]
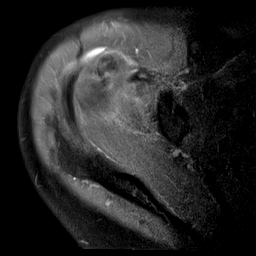
[im 17/20]
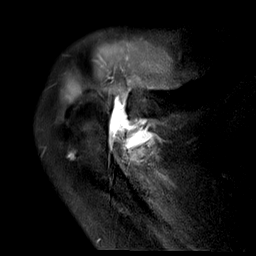
[im 20/20]
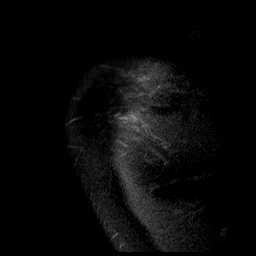

[36 of 40 positions shown; findings below may reference images not displayed]

FINDINGS: Rotator cuff: There is thickening and heterogeneously increased T2
signal in all of the rotator cuff tendons consistent with tendinosis
but no tear is seen.

Muscles:  Normal without atrophy or focal lesion.

Biceps long head:  Intact and normal in appearance.

Acromioclavicular Joint: The patient has moderate glenohumeral
osteoarthritis. There is a joint effusion which extends inferiorly
out of the joint along the superficial aspect of the supraspinatus
muscle belly where fluid measuring 3 cm transverse by 1 cm
craniocaudal by 2 cm AP is identified. Type 2 acromion. There is
fluid in the subacromial/subdeltoid bursa.

Glenohumeral Joint: Appears normal.

Labrum:  Intact.

Bones:  No fracture or focal lesion.

Other: None.
IMPRESSION: Rotator cuff tendinopathy without tear.

Glenohumeral osteoarthritis with a joint effusion which extends
inferiorly out of the joint along the superficial margin of the
supraspinatus as described above.

Subacromial/subdeltoid bursitis.

## 2019-02-06 IMAGING — MR MR LUMBAR SPINE W/O CM
4 of 7 series · 24 of 48 positions shown · non-contrast
Comparison: None.

CLINICAL DATA: Low back pain radiating to both legs and bilateral
foot numbness since [DATE]. No known injury.

EXAM:
MRI LUMBAR SPINE WITHOUT CONTRAST
TECHNIQUE: Multiplanar, multisequence MR imaging of the lumbar spine was
performed. No intravenous contrast was administered.

[Series 2: T2 · sagittal · 4.0mm · 0.55mm/px · 5 of 16 slices shown (1 of 2)]
[im 1/16]
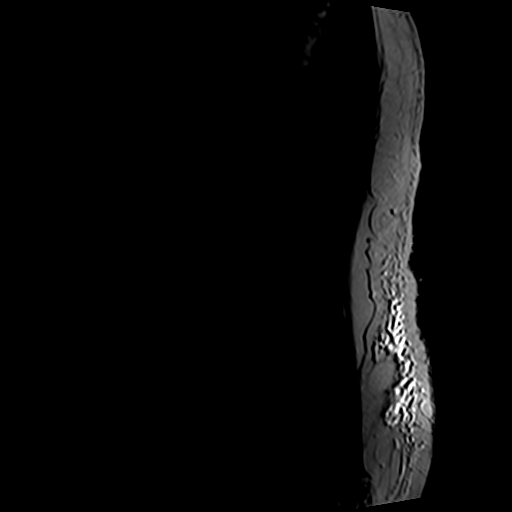
[im 4/16]
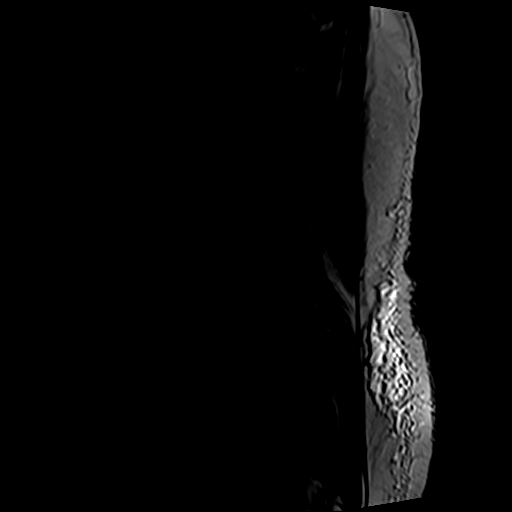
[im 8/16]
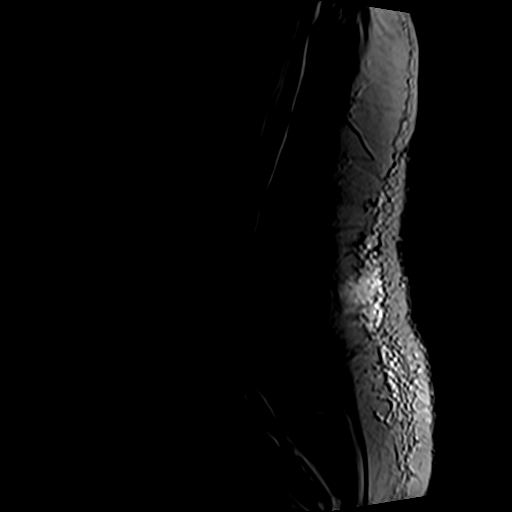
[im 12/16]
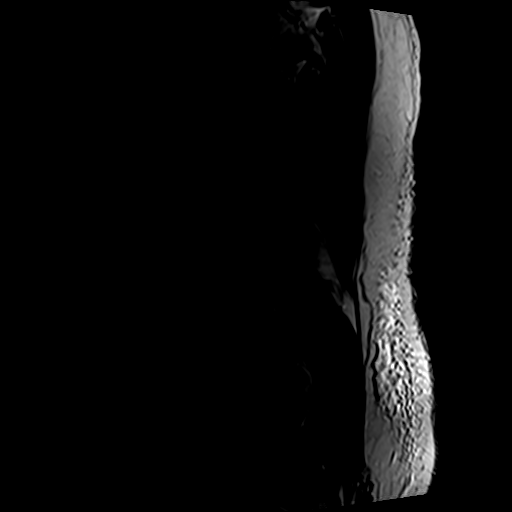
[im 16/16]
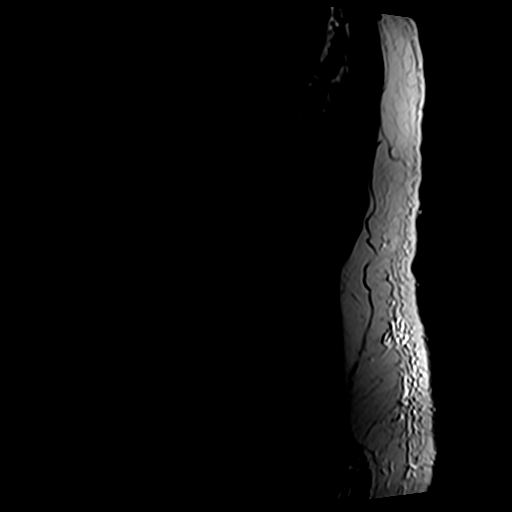

[Series 4: T1 · sagittal · 4.0mm · 0.55mm/px · 4 of 16 slices shown (1 of 2)]
[im 1/16]
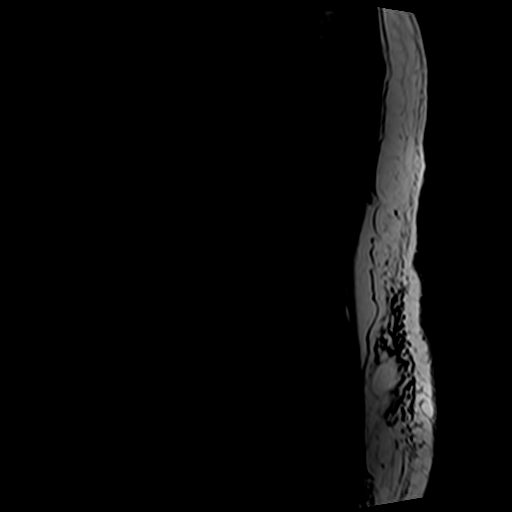
[im 6/16]
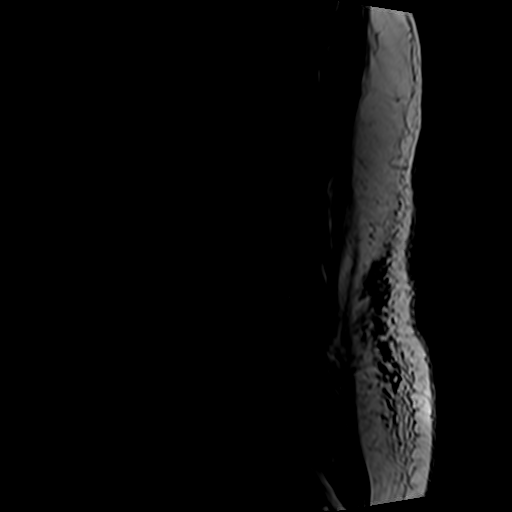
[im 11/16]
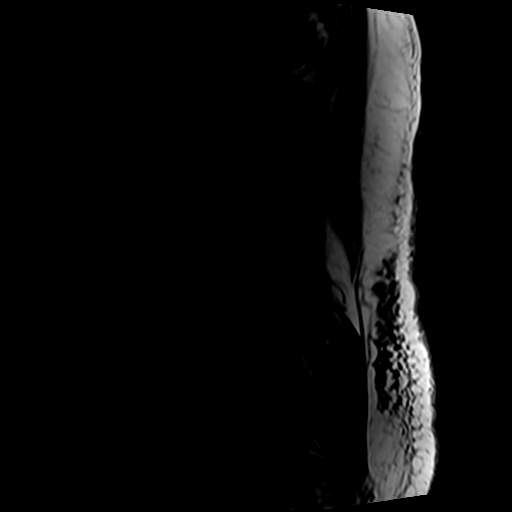
[im 16/16]
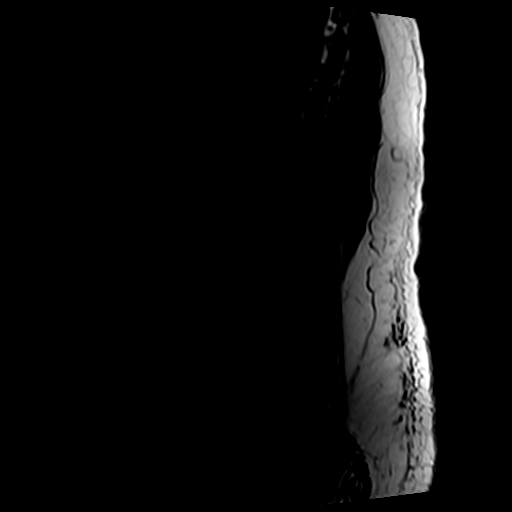

[Series 5: T2 · axial · 4.0mm · 0.78mm/px · z∈[-107,+122]mm · 8 of 39 slices shown (2 of 2)]
[im 1/39]
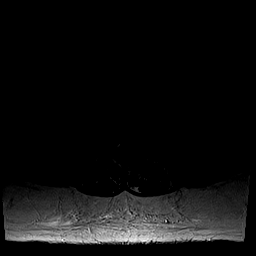
[im 5/39]
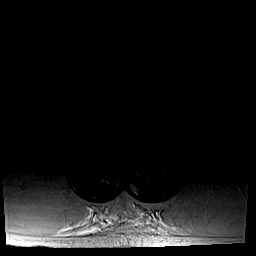
[im 13/39]
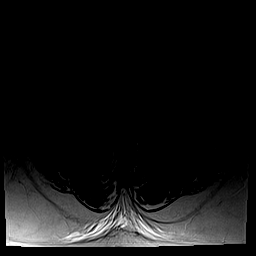
[im 17/39]
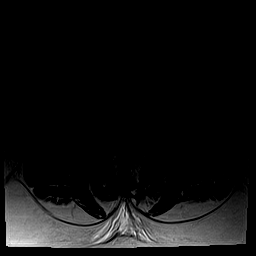
[im 22/39]
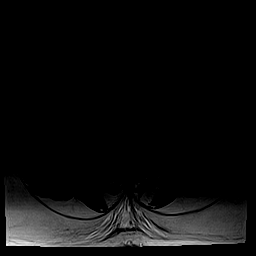
[im 26/39]
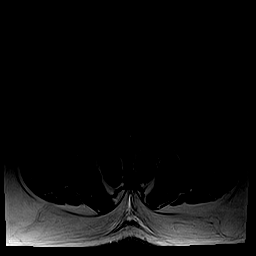
[im 34/39]
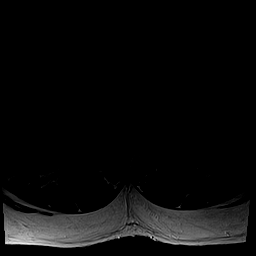
[im 39/39]
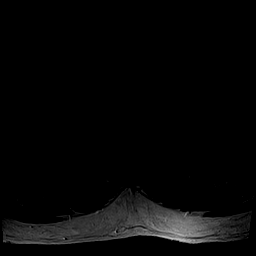

[Series 6: T1 · axial · 4.0mm · 0.39mm/px · z∈[-107,+96]mm · 7 of 39 slices shown (2 of 2)]
[im 1/39]
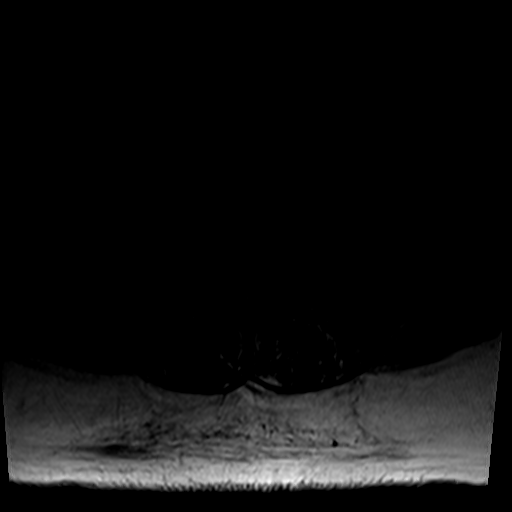
[im 5/39]
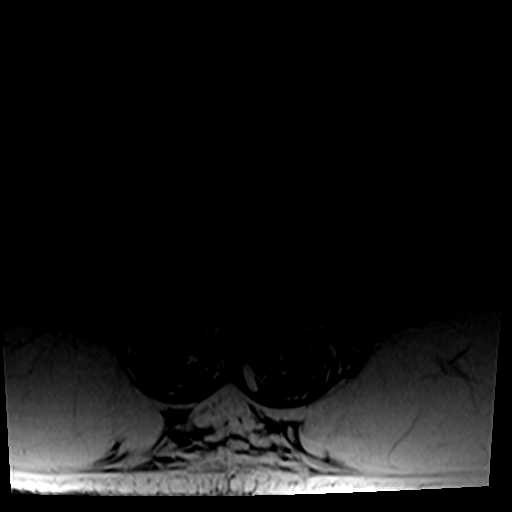
[im 13/39]
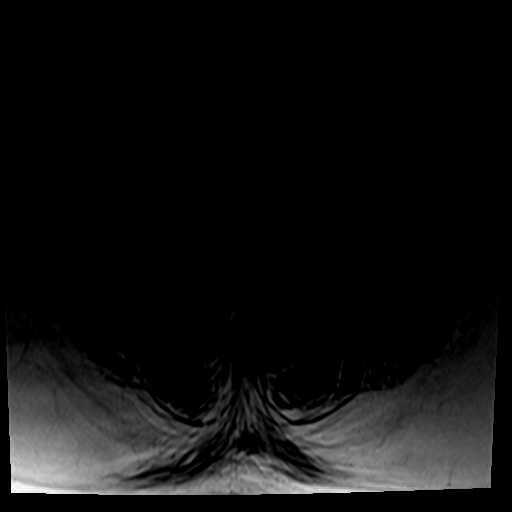
[im 17/39]
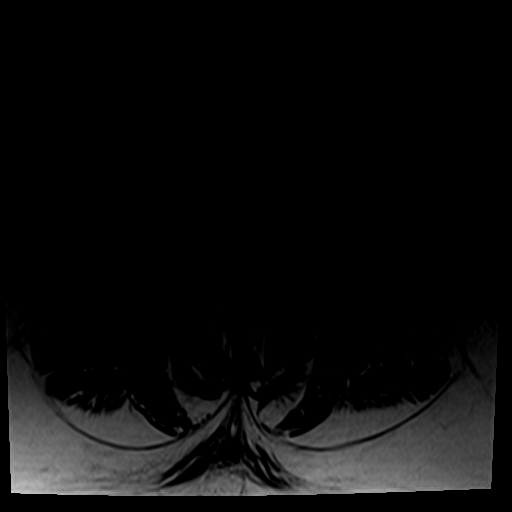
[im 22/39]
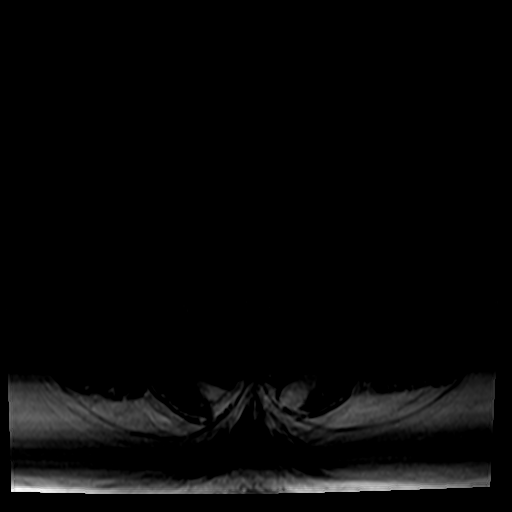
[im 26/39]
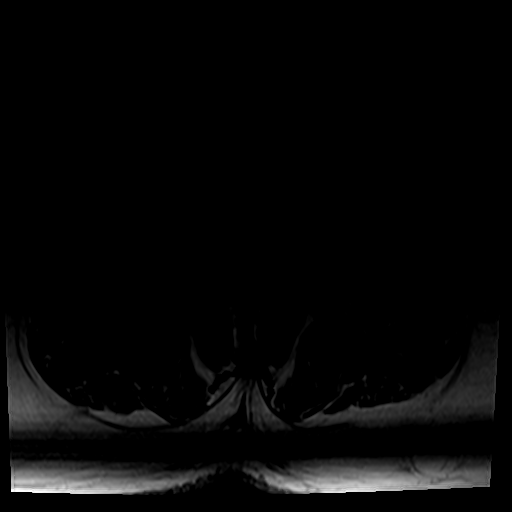
[im 34/39]
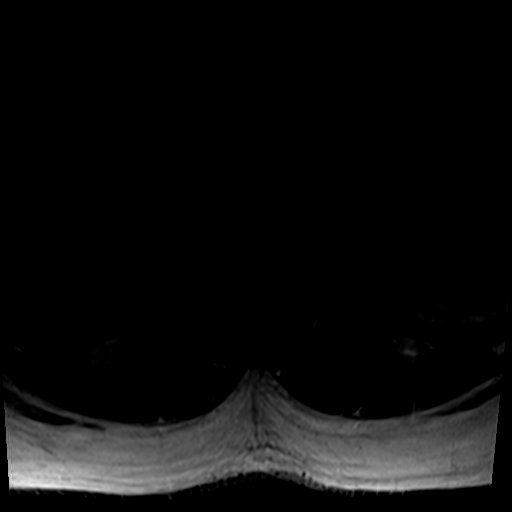

[24 of 48 positions shown; findings below may reference images not displayed]

FINDINGS: Segmentation:  Standard.

Alignment:  Maintained.

Vertebrae: No fracture, evidence of discitis, or bone lesion. There
is some degenerative endplate signal change anteriorly at L4-5.

Conus medullaris and cauda equina: Conus extends to the L1-2 level.
Conus and cauda equina appear normal.

Paraspinal and other soft tissues: Negative.

Disc levels:

T10-11 and T11-12 are imaged in the sagittal plane only. Mild disc
bulging is seen at both levels but the central canal and foramina
appear open.

T12-L1: Negative.

L1-2: Negative.

L2-3: Negative.

L3-4: There is some ligamentum flavum thickening, a shallow disc
bulge and prominent dorsal epidural fat. Mild facet degenerative
disease is more notable on the right. Disc and epidural fat cause
mild to moderate compression of the thecal sac. The foramina are
open.

L4-5: Moderate to advanced facet degenerative disease is worse on
the left. Small left facet joint effusion is present. There is a
broad-based disc bulge, ligamentum flavum thickening and prominent
dorsal epidural fat. Moderate compression of the thecal sac by disc
and epidural fat is present. Disc and facet arthropathy cause
moderate bilateral foraminal narrowing, worse on the left.

L5-S1: Moderate bilateral facet degenerative change. Shallow disc
bulge. No stenosis.
IMPRESSION: 1. Spondylosis worst at L4-5 where there is moderate compression of
the thecal sac by disc and dorsal epidural fat. There is also
moderate bilateral foraminal narrowing at L4-5, worse on the left.
2. Mild to moderate compression of the thecal sac at L3-4 due to a
shallow disc bulge, ligamentum flavum thickening and prominent
dorsal epidural fat. The neural foramina are open at this level.

## 2019-02-12 ENCOUNTER — Telehealth: Payer: Self-pay | Admitting: Family

## 2019-02-12 NOTE — Telephone Encounter (Signed)
Pt wants to know if he can go back to taking Gabapentin and he is not having much success with the celecoxib (CELEBREX) 100 MG capsule /  Pt has pills from last RX of Gabapentin left  Please advise

## 2019-02-14 ENCOUNTER — Other Ambulatory Visit: Payer: Self-pay

## 2019-02-14 MED ORDER — GABAPENTIN 100 MG PO CAPS: 100.0000 mg | ORAL_CAPSULE | Freq: Three times a day (TID) | ORAL | 0 refills | Status: DC

## 2019-02-14 NOTE — Telephone Encounter (Signed)
Patient advised ok to restart medication and also ok to take with celebrex. He verbalized understanding.

## 2019-02-14 NOTE — Telephone Encounter (Signed)
Yes, he can restart gabapentin. He can actually take them both together if he wants.

## 2019-02-19 NOTE — Progress Notes (Signed)
Virtual Visit via Telephone Note  I connected with Brandon [name] on 02/20/19 at  9:45 AM EST by telephone and verified that I am speaking with the correct person using two identifiers.  Location: Patient: Home  Provider: Clinic  This service was conducted via virtual visit.  The patient was located at home. I was located in my office.  Consent was obtained prior to the virtual visit and is aware of possible charges through their insurance for this visit.  The patient is an established patient.  Dr. Corliss Skains, MD conducted the virtual visit and Sherron Ales, PA-C acted as scribe during the service.  Office staff helped with scheduling follow up visits after the service was conducted.    I discussed the limitations, risks, security and privacy concerns of performing an evaluation and management service by telephone and the availability of in person appointments. I also discussed with the patient that there may be a patient responsible charge related to this service. The patient expressed understanding and agreed to proceed.  CC: History of Present Illness: Patient is a 78 year old male seen in consultation per request of his PCP.  According to patient his symptoms are started in August 2020.  He states he was going through very stressful time as he lost his sister and and his nephew from COVID-19.  He started having pain in his both shoulders, both hips, hands and feet.  He states all his joints were swollen.  He was also having neck and lower back pain.  He states he was having difficulty doing routine activities.  He went to see his PCP and had MRI of his shoulder joints.  The shoulder joint MRI showed some tendinopathy and effusion.  He also had MRI of his cervical spine and lumbar spine which showed degenerative changes.  He was referred to orthopedics where he had cortisone injection to bilateral shoulders.  He states the injections were very helpful.  He was also given a prednisone prescription.   He states he took prednisone 1 tablet a day and it was not very effective.  He continues to have swelling in his hands and feet.  He states he had lot of pain and swelling in his knee joints and had difficulty walking.  He had another cortisone injection to his shoulder joints a month later which helped as well.  He was given a prednisone taper on October 31 which was very helpful.  He states that the prednisone taper wore off his symptoms came back.  He has been taking Celebrex 100 mg twice daily and gabapentin.  He was also given colchicine initially assuming that he may have gout but he stopped colchicine.  He continues to have swelling in his hands and feet.  He gives history of morning stiffness lasting for about 30 minutes.  There is history of lupus in his sister.  He states he was found to have some blood in his stool and he has appointment coming up with gastroenterology.   Review of Systems  Constitutional: Positive for malaise/fatigue. Negative for fever.  Eyes: Negative for photophobia, pain, discharge and redness.  Respiratory: Negative for cough, shortness of breath and wheezing.   Cardiovascular: Negative for chest pain and palpitations.  Gastrointestinal: Positive for blood in stool. Negative for constipation and diarrhea.  Genitourinary: Negative for dysuria.  Musculoskeletal: Positive for back pain, joint pain and neck pain. Negative for myalgias.       +Morning stiffness  +Joint swelling   Skin: Negative  for rash.       Denies symptoms of Raynaud's  Neurological: Negative for dizziness and headaches.  Psychiatric/Behavioral: Positive for depression. The patient is not nervous/anxious and does not have insomnia.       Observations/Objective: Physical Exam  Constitutional: He is oriented to person, place, and time.  Neurological: He is alert and oriented to person, place, and time.  Psychiatric: Mood, memory, affect and judgment normal.   Patient reports morning stiffness  for 10 minutes.   Patient denies nocturnal pain.  Difficulty dressing/grooming: Denies Difficulty climbing stairs: Denies Difficulty getting out of chair: Denies Difficulty using hands for taps, buttons, cutlery, and/or writing: Denies   Assessment and Plan: Diagnoses and all orders for this visit:  Rheumatoid arthritis with rheumatoid factor of multiple sites without organ or systems involvement [facility]) Patient has history of inflammatory arthritis involving multiple joints since August 2020.  The MRI of the shoulder joint showed effusion.  He has had 2 cortisone injections to his shoulder joints.  He has had 2 prednisone tapers.  He continues to have pain and swelling in multiple joints involving his hands, feet.  He continues to have discomfort in his shoulders, hip joints and knee joints.  I reviewed his lab results with him.  We also discussed rheumatoid arthritis.  Different treatment options and their side effects were discussed.  I will start him on a prednisone taper starting at 20 mg and taper by 5 mg every week.  We also discussed use of Plaquenil.  Indications side effects contraindications were discussed.  Today will obtain informed consent and then will start him on Plaquenil 200 mg p.o. twice daily.  If he has an adequate response to Plaquenil over the next 6 weeks I may add methotrexate to the regimen.  He has history of congestive heart failure.  He will not be a candidate for anti-TNF's.  I will also obtain additional labs and 1 month in anticipation to start him on more aggressive therapy.  Patient states that he had pneumococcal vaccine, flu vaccine and shingles vaccine.  I will stop Celebrex while he is on prednisone.  Comments: 01/12/19: ANA 1:40 NS, uric acid 7.8, TSH 1.06, RF 20, sed rate 61  High risk medication use: He will be starting on Plaquenil 200 mg 1 tablet by mouth twice daily.  Prescription is pending consent. Standing orders were placed today.  He will obtain lab  work in 1 month, 3 months, then every 5 months.  He will require a baseline PLQ eye exam within 1 month of starting on PLQ.  He will be given a PLQ eye exam form to take with him to his appointment.   Patient was counseled on the purpose, proper use, and adverse effects of hydroxychloroquine including nausea/diarrhea, skin rash, headaches, and sun sensitivity.  Discussed importance of annual eye exams while on hydroxychloroquine to monitor to ocular toxicity and discussed importance of frequent laboratory monitoring.  Provided patient with eye exam form for baseline ophthalmologic exam.  Provided patient with educational materials on hydroxychloroquine and answered all questions.  Patient consented to hydroxychloroquine.  Will upload consent in the media tab.    Dose will be Plaquenil 200 mg twice daily.   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  DDD lumbar-I reviewed MRI of his lumbar spine which was consistent with degenerative disc disease of the lumbar spine.    Other medical conditions are listed as  follows:   Nonischemic cardiomyopathy (HCC)  Essential hypertension  RBBB  CHRONIC SYSTOLIC HEART FAILURE  Obstructive sleep apnea  History of asthma-according to patient it is well controlled.  History of depression     Follow Up Instructions: She will follow up in    I discussed the assessment and treatment plan with the patient. The patient was provided an opportunity to ask questions and all were answered. The patient agreed with the plan and demonstrated an understanding of the instructions.   The patient was advised to call back or seek an in-person evaluation if the symptoms worsen or if the condition fails to improve as anticipated.  I provided 45 minutes of non-face-to-face time during this encounter.   Pollyann Savoy, MD

## 2019-02-20 ENCOUNTER — Encounter: Payer: Self-pay | Admitting: Rheumatology

## 2019-02-20 ENCOUNTER — Other Ambulatory Visit: Payer: Self-pay

## 2019-02-20 ENCOUNTER — Telehealth (INDEPENDENT_AMBULATORY_CARE_PROVIDER_SITE_OTHER): Payer: Medicare Other | Admitting: Rheumatology

## 2019-02-20 DIAGNOSIS — Z8659 Personal history of other mental and behavioral disorders: Secondary | ICD-10-CM

## 2019-02-20 DIAGNOSIS — M5136 Other intervertebral disc degeneration, lumbar region: Secondary | ICD-10-CM

## 2019-02-20 DIAGNOSIS — I428 Other cardiomyopathies: Secondary | ICD-10-CM | POA: Diagnosis not present

## 2019-02-20 DIAGNOSIS — I10 Essential (primary) hypertension: Secondary | ICD-10-CM | POA: Diagnosis not present

## 2019-02-20 DIAGNOSIS — Z79899 Other long term (current) drug therapy: Secondary | ICD-10-CM

## 2019-02-20 DIAGNOSIS — M5030 Other cervical disc degeneration, unspecified cervical region: Secondary | ICD-10-CM

## 2019-02-20 DIAGNOSIS — Z8709 Personal history of other diseases of the respiratory system: Secondary | ICD-10-CM

## 2019-02-20 DIAGNOSIS — R700 Elevated erythrocyte sedimentation rate: Secondary | ICD-10-CM | POA: Diagnosis not present

## 2019-02-20 DIAGNOSIS — G4733 Obstructive sleep apnea (adult) (pediatric): Secondary | ICD-10-CM

## 2019-02-20 DIAGNOSIS — M0579 Rheumatoid arthritis with rheumatoid factor of multiple sites without organ or systems involvement: Secondary | ICD-10-CM

## 2019-02-20 DIAGNOSIS — I4510 Unspecified right bundle-branch block: Secondary | ICD-10-CM

## 2019-02-20 DIAGNOSIS — I5022 Chronic systolic (congestive) heart failure: Secondary | ICD-10-CM

## 2019-02-20 MED ORDER — PREDNISONE 5 MG PO TABS: ORAL_TABLET | ORAL | 0 refills | Status: DC

## 2019-02-20 NOTE — Patient Instructions (Signed)
Standing Labs We placed an order today for your standing lab work.    Please come back and get your standing labs in 1 month, 3 months and then every 5 months.   We have open lab daily Monday through Thursday from 8:30-12:30 PM and 1:30-4:30 PM and Friday from 8:30-12:30 PM and 1:30-4:00 PM at the office of Dr. Shaili Deveshwar.   You may experience shorter wait times on Monday and Friday afternoons. The office is located at [address], [address], [place], [place] [address] No appointment is necessary.   Labs are drawn by [facility].  You may receive a bill from [facility] for your lab work.  If you wish to have your labs drawn at another location, please call the office 24 hours in advance to send orders.  If you have any questions regarding directions or hours of operation,  please call [phone].   Just as a reminder please drink plenty of water prior to coming for your lab work. Thanks!   Hydroxychloroquine tablets What is this medicine? HYDROXYCHLOROQUINE (hye drox ee KLOR oh kwin) is used to treat rheumatoid arthritis and systemic lupus erythematosus. It is also used to treat malaria. This medicine may be used for other purposes; ask your health care provider or pharmacist if you have questions. COMMON BRAND NAME(S): Plaquenil, Quineprox What should I tell my health care provider before I take this medicine? They need to know if you have any of these conditions:  diabetes  eye disease, vision problems  G6PD deficiency  heart disease  history of irregular heartbeat  if you often drink alcohol  kidney disease  liver disease  porphyria  psoriasis  an unusual or allergic reaction to chloroquine, hydroxychloroquine, other medicines, foods, dyes, or preservatives  pregnant or trying to get pregnant  breast-feeding How should I use this medicine? Take this medicine by mouth with a glass of water. Follow the directions on the prescription label. Do not cut, crush  or chew this medicine. Swallow the tablets whole. Take this medicine with food. Avoid taking antacids within 4 hours of taking this medicine. It is best to separate these medicines by at least 4 hours. Take your medicine at regular intervals. Do not take it more often than directed. Take all of your medicine as directed even if you think you are better. Do not skip doses or stop your medicine early. Talk to your pediatrician regarding the use of this medicine in children. While this drug may be prescribed for selected conditions, precautions do apply. Overdosage: If you think you have taken too much of this medicine contact a poison control center or emergency room at once. NOTE: This medicine is only for you. Do not share this medicine with others. What if I miss a dose? If you miss a dose, take it as soon as you can. If it is almost time for your next dose, take only that dose. Do not take double or extra doses. What may interact with this medicine? Do not take this medicine with any of the following medications:  cisapride  dronedarone  pimozide  thioridazine This medicine may also interact with the following medications:  ampicillin  antacids  cimetidine  cyclosporine  digoxin  kaolin  medicines for diabetes, like insulin, glipizide, glyburide  medicines for seizures like carbamazepine, phenobarbital, phenytoin  mefloquine  methotrexate  other medicines that prolong the QT interval (cause an abnormal heart rhythm)  praziquantel This list may not describe all possible interactions. Give your health care   provider a list of all the medicines, herbs, non-prescription drugs, or dietary supplements you use. Also tell them if you smoke, drink alcohol, or use illegal drugs. Some items may interact with your medicine. What should I watch for while using this medicine? Visit your health care professional for regular checks on your progress. Tell your health care professional if  your symptoms do not start to get better or if they get worse. You may need blood work done while you are taking this medicine. If you take other medicines that can affect heart rhythm, you may need more testing. Talk to your health care professional if you have questions. Your vision may be tested before and during use of this medicine. Tell your health care professional right away if you have any change in your eyesight. What side effects may I notice from receiving this medicine? Side effects that you should report to your doctor or health care professional as soon as possible:  allergic reactions like skin rash, itching or hives, swelling of the face, lips, or tongue  changes in vision  decreased hearing or ringing of the ears  muscle weakness  redness, blistering, peeling or loosening of the skin, including inside the mouth  sensitivity to light  signs and symptoms of a dangerous change in heartbeat or heart rhythm like chest pain; dizziness; fast or irregular heartbeat; palpitations; feeling faint or lightheaded, falls; breathing problems  signs and symptoms of liver injury like dark yellow or brown urine; general ill feeling or flu-like symptoms; light-colored stools; loss of appetite; nausea; right upper belly pain; unusually weak or tired; yellowing of the eyes or skin  signs and symptoms of low blood sugar such as feeling anxious; confusion; dizziness; increased hunger; unusually weak or tired; sweating; shakiness; cold; irritable; headache; blurred vision; fast heartbeat; loss of consciousness  suicidal thoughts  uncontrollable head, mouth, neck, arm, or leg movements Side effects that usually do not require medical attention (report to your doctor or health care professional if they continue or are bothersome):  diarrhea  dizziness  hair loss  headache  irritable  loss of appetite  nausea, vomiting  stomach pain This list may not describe all possible side  effects. Call your doctor for medical advice about side effects. You may report side effects to FDA at 1-800-FDA-1088. Where should I keep my medicine? Keep out of the reach of children. Store at room temperature between 15 and 30 degrees C (59 and 86 degrees F). Protect from moisture and light. Throw away any unused medicine after the expiration date. NOTE: This sheet is a summary. It may not cover all possible information. If you have questions about this medicine, talk to your doctor, pharmacist, or health care provider.  2020 Elsevier/Gold Standard (2018-07-10 12:56:32)  

## 2019-02-21 MED ORDER — HYDROXYCHLOROQUINE SULFATE 200 MG PO TABS: 200.0000 mg | ORAL_TABLET | Freq: Two times a day (BID) | ORAL | 0 refills | Status: DC

## 2019-02-21 NOTE — Addendum Note (Signed)
Addended by: Earnestine Mealing on: 02/21/2019 08:40 AM   Modules accepted: Orders

## 2019-03-05 ENCOUNTER — Encounter: Payer: Self-pay | Admitting: Family

## 2019-03-05 NOTE — Progress Notes (Signed)
Mailed out to pt 

## 2019-03-12 ENCOUNTER — Encounter: Payer: Self-pay | Admitting: Gastroenterology

## 2019-03-17 ENCOUNTER — Other Ambulatory Visit: Payer: Self-pay | Admitting: Family

## 2019-03-20 ENCOUNTER — Telehealth: Payer: Self-pay

## 2019-03-20 NOTE — Telephone Encounter (Signed)
Copied from CRM [id number]. Topic: General - Inquiry >> Mar 19, 2019  4:27 PM Wyonia Hough E wrote: Reason for CRM: Pt wanted to find out if there is any reasons that he should NOT get the covid vaccine/ Please advise

## 2019-03-21 NOTE — Telephone Encounter (Signed)
Spoke with patient regarding vaccine.  Explained we are following CDC guidelines. Advised there are questions and criteria to meet to be eligible for vaccine, which patient plans to get.  Advised to call with additional questions.

## 2019-03-21 NOTE — Telephone Encounter (Signed)
Pt requiring again about Covid vaccine and if it is wise to take.

## 2019-04-02 NOTE — Progress Notes (Deleted)
Office Visit Note  Patient: Brandon [name]             Date of Birth: [birth year]           MRN: [id number]             PCP: Debbrah Alar, NP Referring: Debbrah Alar, NP Visit Date: 04/05/2019 Occupation: @GUAROCC @  Subjective:  No chief complaint on file.   History of Present Illness: Brandon [name] is a 79 y.o. male ***   Activities of Daily Living:  Patient reports morning stiffness for *** {minute/hour:19697}.   Patient {ACTIONS;DENIES/REPORTS:21021675::"Denies"} nocturnal pain.  Difficulty dressing/grooming: {ACTIONS;DENIES/REPORTS:21021675::"Denies"} Difficulty climbing stairs: {ACTIONS;DENIES/REPORTS:21021675::"Denies"} Difficulty getting out of chair: {ACTIONS;DENIES/REPORTS:21021675::"Denies"} Difficulty using hands for taps, buttons, cutlery, and/or writing: {ACTIONS;DENIES/REPORTS:21021675::"Denies"}  No Rheumatology ROS completed.   PMFS History:  Patient Active Problem List   Diagnosis Date Noted  . Heme positive stool 02/01/2019  . RBBB 10/03/2018  . Bilateral shoulder pain 08/24/2016  . Depression 12/09/2015  . Pain and swelling of knee 06/14/2014  . Edema 12/06/2011  . Asthma 08/13/2009  . Hyperglycemia 08/13/2009  . Obstructive sleep apnea 01/23/2009  . CHRONIC SYSTOLIC HEART FAILURE 97/04/6376  . OBESITY, UNSPECIFIED 08/26/2008  . 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 Essential hypertension 06/28/2008  . Nonischemic cardiomyopathy ([place]) 06/28/2008    Past Medical History:  Diagnosis Date  . Asthma    Hx of childhood asthma, disappeared for a while, then resurfaced 6-7 years ago.   . Cardiomyopathy    with a negative cardiac catheterization in the past. (EF appriximately 40-45%)   . Heme positive stool 02/01/2019  . HTN (hypertension)    x 30 years  . Obesity, unspecified   . Sleep apnea    CPAP  . Unspecified disorder resulting from impaired renal function     Family History  Problem Relation Age of  Onset  . Cancer Father        multiple melanoma  . Lupus Sister        died at 84  . Diabetes Mellitus II Sister        died from covid-19  . Asthma Daughter   . Neuropathy Sister    Past Surgical History:  Procedure Laterality Date  . None     Social History   Social History Narrative   Grew up in [place], attended [employer]. First wife died of breast ca in 07/15/1997. 3 children. Remarried- 8 years. 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    Pt signed designated party release granting access to [facility] to his wife Tonia Ghent. Detailed message may be left on home or cell phone. Shanon Payor August 13, 2009 11:34 am.    Immunization History  Administered Date(s) Administered  . Fluad Quad(high Dose 65+) 11/21/2018  . Influenza Split 02/24/2011, 12/03/2011  . Influenza Whole 03/16/2007, 12/29/2009  . Influenza, High Dose Seasonal PF 02/26/2015, 12/09/2015, 04/22/2017, 12/14/2017  . Influenza,inj,Quad PF,6+ Mos 02/13/2013, 11/14/2013  . Pneumococcal Conjugate-13 11/14/2013  . Pneumococcal Polysaccharide-23 08/13/2009  . Td 07/26/2006  . Tdap 09/22/2012, 05/23/2017  . Zoster 05/17/2011     Objective: Vital Signs: There were no vitals taken for this visit.   Physical Exam   Musculoskeletal Exam: ***  CDAI Exam: CDAI Score: -- Patient Global: --; Provider Global: -- Swollen: --; Tender: -- Joint Exam 04/05/2019   No joint exam has been documented for this visit   There is currently no information documented on the homunculus.  Go to the Rheumatology activity and complete the homunculus joint exam.  Investigation: No additional findings.  Imaging: No results found.  Recent Labs: Lab Results  Component Value Date   WBC 7.6 01/12/2019   HGB 10.0 (L) 01/12/2019   PLT 437.0 (H) 01/12/2019   NA 137 01/12/2019   K 3.8 01/12/2019   CL 105 01/12/2019   CO2 24 01/12/2019   GLUCOSE 105 (H) 01/12/2019   BUN 20 01/12/2019   CREATININE 1.23  01/12/2019   BILITOT 0.5 01/12/2019   ALKPHOS 58 01/12/2019   AST 9 01/12/2019   ALT 8 01/12/2019   PROT 6.6 01/12/2019   ALBUMIN 3.2 (L) 01/12/2019   CALCIUM 9.6 01/12/2019   GFRAA 61 12/28/2018    Speciality Comments: No specialty comments available.  Procedures:  No procedures performed Allergies: Ace inhibitors and Isosorb dinitrate-hydralazine   Assessment / Plan:     Visit Diagnoses: No diagnosis found.  Orders: No orders of the defined types were placed in this encounter.  No orders of the defined types were placed in this encounter.   Face-to-face time spent with patient was *** minutes. Greater than 50% of time was spent in counseling and coordination of care.  Follow-Up Instructions: No follow-ups on file.   Pollyann Savoy, MD  Note - This record has been created using Animal nutritionist.  Chart creation errors have been sought, but may not always  have been located. Such creation errors do not reflect on  the standard of medical care.

## 2019-04-03 ENCOUNTER — Ambulatory Visit: Payer: Medicare Other | Admitting: Rheumatology

## 2019-04-05 ENCOUNTER — Ambulatory Visit: Payer: Medicare Other | Admitting: Rheumatology

## 2019-04-05 ENCOUNTER — Ambulatory Visit: Payer: Medicare Other | Admitting: Gastroenterology

## 2019-04-08 ENCOUNTER — Ambulatory Visit: Payer: Medicare Other | Attending: Internal Medicine

## 2019-04-08 DIAGNOSIS — Z23 Encounter for immunization: Secondary | ICD-10-CM | POA: Insufficient documentation

## 2019-04-09 NOTE — Progress Notes (Signed)
   Covid-19 Vaccination Clinic  Name:  Paulo E Swaziland    MRN: [id number] DOB: [birth year]  04/08/2019  Mr. Swaziland was observed post Covid-19 immunization for 15 minutes without incidence. He was provided with Vaccine Information Sheet and instruction to access the V-Safe system.   Mr. Swaziland was instructed to call 911 with any severe reactions post vaccine: Marland Kitchen Difficulty breathing  . Swelling of your face and throat  . A fast heartbeat  . A bad rash all over your body  . Dizziness and weakness    Immunizations Administered    Name Date Dose VIS Date Route   Moderna COVID-19 Vaccine 04/08/2019  3:35 PM 0.5 mL 02/13/2019 Intramuscular   Manufacturer: Gala Murdoch   Lot: [id number]   NDC: 29191-660-60      Documented on behalf of: C. Jimmey Ralph

## 2019-04-10 ENCOUNTER — Ambulatory Visit: Payer: Medicare Other

## 2019-04-12 ENCOUNTER — Other Ambulatory Visit: Payer: Self-pay | Admitting: Pharmacist

## 2019-04-12 DIAGNOSIS — Z79899 Other long term (current) drug therapy: Secondary | ICD-10-CM

## 2019-04-12 DIAGNOSIS — R700 Elevated erythrocyte sedimentation rate: Secondary | ICD-10-CM

## 2019-04-12 DIAGNOSIS — M0579 Rheumatoid arthritis with rheumatoid factor of multiple sites without organ or systems involvement: Secondary | ICD-10-CM

## 2019-04-16 ENCOUNTER — Telehealth: Payer: Self-pay | Admitting: Rheumatology

## 2019-04-16 ENCOUNTER — Telehealth: Payer: Self-pay | Admitting: Family

## 2019-04-16 DIAGNOSIS — M0579 Rheumatoid arthritis with rheumatoid factor of multiple sites without organ or systems involvement: Secondary | ICD-10-CM

## 2019-04-16 DIAGNOSIS — M0689 Other specified rheumatoid arthritis, multiple sites: Secondary | ICD-10-CM | POA: Diagnosis not present

## 2019-04-16 DIAGNOSIS — Z79899 Other long term (current) drug therapy: Secondary | ICD-10-CM | POA: Diagnosis not present

## 2019-04-16 MED ORDER — HYDROXYCHLOROQUINE SULFATE 200 MG PO TABS: 200.0000 mg | ORAL_TABLET | Freq: Two times a day (BID) | ORAL | 0 refills | Status: DC

## 2019-04-16 NOTE — Telephone Encounter (Signed)
Pt asking if he can go back to using gabapentin or celecoxib for pain in his legs. He said that he was taking it before seeing Rheumatology. Dr. Corliss Skains had him on Hydroxychloroquine & prednisone but that is gone. He has a follow with Dr. Corliss Skains 2/9 but needing something until that time. He has medication left but wanted to ask before taking.   Please call back [phone]

## 2019-04-16 NOTE — Telephone Encounter (Signed)
FUI Called patient to get more information he reports he was advised per Dr. Corliss Skains to discontinue gabapentin and celebrex while taking prednisone. He is finish with the prednisone but symptoms are coming back. I advised patient to let Dr. Sherrye Payor know before his appointment on the 9th. He will contact her today.

## 2019-04-16 NOTE — Telephone Encounter (Signed)
Noted and agree. 

## 2019-04-16 NOTE — Telephone Encounter (Signed)
Patient called stating he had his Plaquenil eye exam with Dr. Nilda Riggs.  Patient states he will bring the completed Plaquenil eye exam form to his appointment on Tuesday, 04/24/2019.  Patient is requesting a return call to let him know if Dr. Corliss Skains wants him to continue taking Plaquenil.

## 2019-04-16 NOTE — Telephone Encounter (Signed)
Last Visit: 02/20/19 Next Visit: 04/24/19 Labs: 04/12/19 Creat 1.40, GFR 55, Glucose 131, Hgb 12.8, HCT 38.2 Platelets 49 Eye exam: 04/16/19 WNL  Okay to refill per Dr. Corliss Skains   Patient advised we have received the PLQ eye exam results and we sent the prescription to the pharmacy.

## 2019-04-18 ENCOUNTER — Other Ambulatory Visit: Payer: Self-pay | Admitting: *Deleted

## 2019-04-18 ENCOUNTER — Other Ambulatory Visit: Payer: Self-pay

## 2019-04-18 ENCOUNTER — Telehealth: Payer: Self-pay | Admitting: *Deleted

## 2019-04-18 ENCOUNTER — Encounter: Payer: Self-pay | Admitting: Gastroenterology

## 2019-04-18 ENCOUNTER — Ambulatory Visit: Payer: Medicare Other | Admitting: Gastroenterology

## 2019-04-18 ENCOUNTER — Telehealth: Payer: Self-pay

## 2019-04-18 VITALS — BP 106/66 | HR 78 | Temp 97.8°F | Ht 72.0 in | Wt 271.4 lb

## 2019-04-18 DIAGNOSIS — R899 Unspecified abnormal finding in specimens from other organs, systems and tissues: Secondary | ICD-10-CM

## 2019-04-18 DIAGNOSIS — K760 Fatty (change of) liver, not elsewhere classified: Secondary | ICD-10-CM | POA: Diagnosis not present

## 2019-04-18 DIAGNOSIS — Z01818 Encounter for other preprocedural examination: Secondary | ICD-10-CM

## 2019-04-18 DIAGNOSIS — R195 Other fecal abnormalities: Secondary | ICD-10-CM | POA: Diagnosis not present

## 2019-04-18 DIAGNOSIS — D696 Thrombocytopenia, unspecified: Secondary | ICD-10-CM

## 2019-04-18 LAB — COMPLETE METABOLIC PANEL WITH GFR
AG Ratio: 1.5 (calc) (ref 1.0–2.5)
ALT: 11 U/L (ref 9–46)
AST: 13 U/L (ref 10–35)
Albumin: 3.8 g/dL (ref 3.6–5.1)
Alkaline phosphatase (APISO): 57 U/L (ref 35–144)
BUN/Creatinine Ratio: 13 (calc) (ref 6–22)
BUN: 18 mg/dL (ref 7–25)
CO2: 22 mmol/L (ref 20–32)
Calcium: 9.7 mg/dL (ref 8.6–10.3)
Chloride: 105 mmol/L (ref 98–110)
Creat: 1.4 mg/dL — ABNORMAL HIGH (ref 0.70–1.18)
GFR, Est African American: 55 mL/min/{1.73_m2} — ABNORMAL LOW (ref >=60)
GFR, Est Non African American: 48 mL/min/{1.73_m2} — ABNORMAL LOW (ref >=60)
Globulin: 2.6 g/dL (calc) (ref 1.9–3.7)
Glucose, Bld: 131 mg/dL — ABNORMAL HIGH (ref 65–99)
Potassium: 4.3 mmol/L (ref 3.5–5.3)
Sodium: 138 mmol/L (ref 135–146)
Total Bilirubin: 0.4 mg/dL (ref 0.2–1.2)
Total Protein: 6.4 g/dL (ref 6.1–8.1)

## 2019-04-18 LAB — QUANTIFERON-TB GOLD PLUS
Mitogen-NIL: >10 IU/mL
NIL: 0.03 IU/mL
QuantiFERON-TB Gold Plus: NEGATIVE
TB1-NIL: 0.03 IU/mL
TB2-NIL: 0.03 IU/mL

## 2019-04-18 LAB — CBC WITH DIFFERENTIAL/PLATELET
Absolute Monocytes: 672 cells/uL (ref 200–950)
Basophils Absolute: 42 cells/uL (ref 0–200)
Basophils Relative: 0.5 %
Eosinophils Absolute: 149 cells/uL (ref 15–500)
Eosinophils Relative: 1.8 %
HCT: 38.2 % — ABNORMAL LOW (ref 38.5–50.0)
Hemoglobin: 12.8 g/dL — ABNORMAL LOW (ref 13.2–17.1)
Lymphs Abs: 1527 cells/uL (ref 850–3900)
MCH: 29.7 pg (ref 27.0–33.0)
MCHC: 33.5 g/dL (ref 32.0–36.0)
MCV: 88.6 fL (ref 80.0–100.0)
MPV: 12 fL (ref 7.5–12.5)
Monocytes Relative: 8.1 %
Neutro Abs: 5910 cells/uL (ref 1500–7800)
Neutrophils Relative %: 71.2 %
Platelets: 49 10*3/uL — ABNORMAL LOW (ref 140–400)
RBC: 4.31 10*6/uL (ref 4.20–5.80)
RDW: 14.4 % (ref 11.0–15.0)
Total Lymphocyte: 18.4 %
WBC: 8.3 10*3/uL (ref 3.8–10.8)

## 2019-04-18 LAB — IGG, IGA, IGM
IgG (Immunoglobin G), Serum: 1277 mg/dL (ref 600–1540)
IgM, Serum: 108 mg/dL (ref 50–300)
Immunoglobulin A: 269 mg/dL (ref 70–320)

## 2019-04-18 LAB — HEPATITIS B CORE ANTIBODY, IGM: Hep B C IgM: NONREACTIVE

## 2019-04-18 LAB — PROTEIN ELECTROPHORESIS, SERUM, WITH REFLEX
Albumin ELP: 3.2 g/dL — ABNORMAL LOW (ref 3.8–4.8)
Alpha 1: 0.3 g/dL (ref 0.2–0.3)
Alpha 2: 0.8 g/dL (ref 0.5–0.9)
Beta 2: 0.4 g/dL (ref 0.2–0.5)
Beta Globulin: 0.4 g/dL (ref 0.4–0.6)
Gamma Globulin: 1.1 g/dL (ref 0.8–1.7)
Total Protein: 6.2 g/dL (ref 6.1–8.1)

## 2019-04-18 LAB — HIV ANTIBODY (ROUTINE TESTING W REFLEX): HIV 1&2 Ab, 4th Generation: NONREACTIVE

## 2019-04-18 LAB — CK: Total CK: 94 U/L (ref 44–196)

## 2019-04-18 LAB — 14-3-3 ETA PROTEIN: 14-3-3 eta Protein: <0.2 ng/mL (ref <0.2)

## 2019-04-18 LAB — GLUCOSE 6 PHOSPHATE DEHYDROGENASE: G-6PDH: 17.2 U/g Hgb (ref 7.0–20.5)

## 2019-04-18 LAB — HEPATITIS C ANTIBODY
Hepatitis C Ab: NONREACTIVE
SIGNAL TO CUT-OFF: 0.03 (ref <1.00)

## 2019-04-18 LAB — IFE INTERPRETATION: Immunofix Electr Int: NOT DETECTED

## 2019-04-18 LAB — HEPATITIS B SURFACE ANTIGEN: Hepatitis B Surface Ag: NONREACTIVE

## 2019-04-18 LAB — CYCLIC CITRUL PEPTIDE ANTIBODY, IGG: Cyclic Citrullin Peptide Ab: <16 UNITS

## 2019-04-18 MED ORDER — SUPREP BOWEL PREP KIT 17.5-3.13-1.6 GM/177ML PO SOLN: 1.0000 | ORAL | 0 refills | Status: DC

## 2019-04-18 NOTE — Progress Notes (Signed)
I called the patient to discuss lab work.   Hgb and Hct have improved.  He had an appointment with Dr. Chales Abrahams today for evaluation of heme positive stools with anemia.  EGD/colonoscopy will be scheduled in the future.    He has new onset thrombocytopenia.  He has not started taking plaquenil and was advised to hold off on taking PLQ at this time.    He was advised to be evaluated in the ED if he has any injuries or wounds since his plt count is so low.  An urgent referral to hematology  will be placed today.   Creatinine is elevated and GFR is 55.  He is no longer taking celebrex. He was advised to avoid taking all NSAIDs.    IFE-did not reveal any monoclonal proteins.  Rest of lab work was WNL and will be further discussed at his follow up visit on 04/24/19.

## 2019-04-18 NOTE — Patient Instructions (Addendum)
If you are age 79 or older, your body mass index should be between 23-30. Your Body mass index is 36.81 kg/m. If this is out of the aforementioned range listed, please consider follow up with your Primary Care Provider.  If you are age 79 or younger, your body mass index should be between 19-25. Your Body mass index is 36.81 kg/m. If this is out of the aformentioned range listed, please consider follow up with your Primary Care Provider.   You have been scheduled for an endoscopy. Please follow written instructions given to you at your visit today. If you use inhalers (even only as needed), please bring them with you on the day of your procedure. Your physician has requested that you go to www.startemmi.com and enter the access code given to you at your visit today. This web site gives a general overview about your procedure. However, you should still follow specific instructions given to you by our office regarding your preparation for the procedure.  You have been scheduled for an abdominal ultrasound at [facility] (1st floor) on 04/26/19 at 9am. Please arrive 15 minutes prior to your appointment for registration. Make certain not to have anything to eat or drink 6 hours prior to your appointment. Should you need to reschedule your appointment, please contact radiology at [phone]. This test typically takes about 30 minutes to perform.  We have sent the following medications to your pharmacy for you to pick up at your convenience: Suprep  Thank you,  Dr. [name]

## 2019-04-18 NOTE — Telephone Encounter (Signed)
Urgent referral placed for Hematology.

## 2019-04-18 NOTE — Telephone Encounter (Signed)
I spoke with Dr. Chales Abrahams.  He stated that evaluated patient and found he was thrombocytopenic.  He thought it was related to Plaquenil.  I explained to him that patient has not a started Plaquenil yet.  He also found that patient had hepatomegaly and he consumes alcohol.  He plans to contact the hematology office tomorrow which is across from his office.

## 2019-04-18 NOTE — Progress Notes (Signed)
Chief Complaint: Heme positive stools  Referring Provider:  Debbrah Alar, NP      ASSESSMENT AND PLAN;   #1.  Heme positive stools with anemia  #2.  New onset thrombocytopenia (437K to 49K as below)-Plaquenil has not been started.   #3.  Fatty liver  #4.  H/O colonic polyp s/p polypectomy(TA) 08/2007 (Dr. Sharlett Iles).  Recommended to repeat in 5 years.  #5. Comorbid conditions include CHF (EF 40-45%), obesity, OSA, RA, HTN, mild CRI, asthma.    Plan: -We will discuss with Dr. Estanislado Pandy.  He has appointment with her on 04/24/2019. -[place] abdo (r/t portal HTN) -He would need EGD/colon afer cardio clearaence. Discussed risks & benefits. (Risks including rare perforation req laparotomy, bleeding after bx/polypectomy req blood transfusion, rarely missing neoplasms, risks of anesthesia/sedation). Benefits outweigh the risks. Patient agrees to proceed. All the questions were answered. Consent forms given for review. -Stop celebrex for now  Addendum: I have discussed with Dr. Bo Merino over the phone about an hour ago -Patient had not started hydroxychloroquine.  Hence, etiology of new onset thrombocytopenia is not clear. -She recommends hematology consultation.  We will arrange with Dr. Marin Olp or Dr Maylon Peppers. -Follow results of ultrasound (r/o liver cirrhosis) -FU in 6 weeks. -After the hematology consultation, we would consider EGD and colonoscopy.  Can proceed with cardiology clearance for now.  Will arrange EGD/colon at follow-up visit. HPI:    Brandon [name] is a 79 y.o. male  With heme pos stools Hb 14.6 to 10 to 12.8 (as below) plts 247K to 437 to 49K (as below) No nausea, vomiting, heartburn, regurgitation, odynophagia or dysphagia.  No significant diarrhea or constipation.  No melena or hematochezia. No unintentional weight loss. No abdominal pain.  On HCQ for RA ?,  Seen by Dr. Estanislado Pandy recently.  Have reviewed her note.  He denies having any history of easy  bruisability, gum bleeds or nosebleeds.  No nonsteroidals except Celebrex.  He does take baby aspirin.  History of alcohol use for several years until last year.  Currently drinking very infrequently. Past Medical History:  Diagnosis Date  . Anemia   . Asthma    Hx of childhood asthma, disappeared for a while, then resurfaced 6-7 years ago.   . Cardiomyopathy    with a negative cardiac catheterization in the past. (EF appriximately 40-45%)   . Heme positive stool 02/01/2019  . HTN (hypertension)    x 30 years  . Obesity, unspecified   . Pneumonia   . Sleep apnea    CPAP  . 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 None      Family History  Problem Relation Age of Onset  . Cancer Father        multiple melanoma  . Lupus Sister        died at 11  . Diabetes Mellitus II Sister        died from covid-19  . Asthma Daughter   . Neuropathy Sister   . Colon cancer Neg Hx   . Esophageal cancer Neg Hx     Social History   Tobacco Use  . Smoking status: Former Research scientist (life sciences)  . Smokeless tobacco: Never Used  . Tobacco comment: quit smoling in 1990. started when 18, 1 ppd  Substance Use Topics  . Alcohol use: Yes    Comment: occasionaly   . Drug use: Never    Current Outpatient  Medications  Medication Sig Dispense Refill  . amLODipine (NORVASC) 10 MG tablet Take 1 tablet (10 mg total) by mouth daily. 90 tablet 1  . aspirin 81 MG tablet Take 81 mg by mouth daily.      Marland Kitchen b complex vitamins tablet Take 1 tablet by mouth daily.    . celecoxib (CELEBREX) 100 MG capsule Take 100 mg by mouth 2 (two) times daily.    . colchicine 0.6 MG tablet Take 2 tablets by mouth now and 1 tablet one hour later. 3 tablet 0  . diazepam (VALIUM) 5 MG tablet Take 1 tablet by mouth 60 minutes prior to MRI.  May take second tablet but only if needed. Do not take with pain medication (hydrocodone or tramadol) 2 tablet 0  .  Fluticasone-Salmeterol (ADVAIR) 500-50 MCG/DOSE AEPB INHALE 1 DOSE BY MOUTH TWICE DAILY 60 each 5  . furosemide (LASIX) 20 MG tablet Take 1 tablet (20 mg total) by mouth daily as needed. 30 tablet 3  . gabapentin (NEURONTIN) 100 MG capsule Take 1 capsule (100 mg total) by mouth 3 (three) times daily. 90 capsule 0  . HYDROcodone-acetaminophen (NORCO/VICODIN) 5-325 MG tablet Take 1 tablet by mouth every 6 (six) hours as needed for moderate pain. 20 tablet 0  . hydroxychloroquine (PLAQUENIL) 200 MG tablet Take 1 tablet (200 mg total) by mouth 2 (two) times daily. 180 tablet 0  . losartan (COZAAR) 50 MG tablet Take 1 tablet (50 mg total) by mouth 2 (two) times daily. 180 tablet 1  . metoprolol succinate (TOPROL-XL) 25 MG 24 hr tablet Take 1 tablet (25 mg total) by mouth daily. 90 tablet 1  . montelukast (SINGULAIR) 10 MG tablet Take 1 tablet (10 mg total) by mouth at bedtime. 90 tablet 1  . PROAIR HFA 108 (90 Base) MCG/ACT inhaler INHALE 2 PUFFS BY MOUTH EVERY 6 HOURS AS NEEDED FOR WHEEZING AND FOR SHORTNESS OF BREATH 6.7 g 3  . sertraline (ZOLOFT) 25 MG tablet Takes 1 tab by mouth once daily for 2 weeks, then decrease to 25mg  every other day 30 tablet 0  . spironolactone (ALDACTONE) 25 MG tablet Take 1 tablet (25 mg total) by mouth daily. 90 tablet 1  . tamsulosin (FLOMAX) 0.4 MG CAPS capsule Take 1 capsule (0.4 mg total) by mouth daily. 30 capsule 3   No current facility-administered medications for this visit.    Allergies  Allergen Reactions  . Ace Inhibitors     REACTION: cough  . Isosorb Dinitrate-Hydralazine     REACTION: dizziness/hypotensive    Review of Systems:  Constitutional: Denies fever, chills, diaphoresis, appetite change and fatigue.  HEENT: Denies photophobia, eye pain, redness, hearing loss, ear pain, congestion, sore throat, rhinorrhea, sneezing, mouth sores, neck pain, neck stiffness and tinnitus.   Respiratory: Denies SOB, DOE, cough, chest tightness,  and wheezing.     Cardiovascular: Denies chest pain, palpitations and leg swelling.  Genitourinary: Denies dysuria, urgency, frequency, hematuria, flank pain and difficulty urinating.  Musculoskeletal: Denies myalgias, back pain, Has joint swelling, arthralgias and gait problem.  Skin: No rash.  Neurological: Denies dizziness, seizures, syncope, weakness, light-headedness, numbness and headaches.  Hematological: Denies adenopathy. Easy bruising, personal or family bleeding history  Psychiatric/Behavioral: has anxiety or depression     Physical Exam:    BP 106/66   Pulse 78   Temp 97.8 F (36.6 C)   Ht 6' (1.829 m)   Wt 271 lb 6 oz (123.1 kg)   BMI 36.81 kg/m  Wt Readings  from Last 3 Encounters:  04/18/19 271 lb 6 oz (123.1 kg)  01/30/19 252 lb (114.3 kg)  01/12/19 252 lb (114.3 kg)   Constitutional:  Well-developed, in no acute distress. Psychiatric: Normal mood and affect. Behavior is normal. HEENT: Pupils normal.  Conjunctivae are normal. No scleral icterus. Neck supple.  Cardiovascular: Normal rate, regular rhythm. No edema Pulmonary/chest: Effort normal and breath sounds normal. No wheezing, rales or rhonchi. Abdominal: Soft, nondistended. Nontender. Bowel sounds active throughout. There are no masses palpable.  Liver palpated 2 cm below the costal margin, nontender.  No definite splenomegaly. Rectal:  defered Neurological: Alert and oriented to person place and time. Skin: Skin is warm and dry. No rashes noted. Ext-2+ edema. (Per cardiology venous insufficiency)  Data Reviewed: I have personally reviewed following labs and imaging studies  CBC: CBC Latest Ref Rng & Units 04/12/2019 01/12/2019 04/22/2018  WBC 3.8 - 10.8 Thousand/uL 8.3 7.6 9.6  Hemoglobin 13.2 - 17.1 g/dL 12.8(L) 10.0(L) 14.6  Hematocrit 38.5 - 50.0 % 38.2(L) 30.3(L) 45.7  Platelets 140 - 400 Thousand/uL 49(L) 437.0(H) 247    CMP: CMP Latest Ref Rng & Units 04/12/2019 04/12/2019 01/12/2019  Glucose 65 - 99 mg/dL -  426(S) 341(D)  BUN 7 - 25 mg/dL - 18 20  Creatinine 6.22 - 1.18 mg/dL - 2.97(L) 8.92  Sodium 135 - 146 mmol/L - 138 137  Potassium 3.5 - 5.3 mmol/L - 4.3 3.8  Chloride 98 - 110 mmol/L - 105 105  CO2 20 - 32 mmol/L - 22 24  Calcium 8.6 - 10.3 mg/dL - 9.7 9.6  Total Protein 6.1 - 8.1 g/dL 6.2 6.4 6.6  Total Bilirubin 0.2 - 1.2 mg/dL - 0.4 0.5  Alkaline Phos 39 - 117 U/L - - 58  AST 10 - 35 U/L - 13 9  ALT 9 - 46 U/L - 11 8    GFR: Estimated Creatinine Clearance: 58.9 mL/min (A) (by C-G formula based on SCr of 1.4 mg/dL (H)). Liver Function Tests: Recent Labs  Lab 04/12/19 1317  AST 13  ALT 11  BILITOT 0.4  PROT 6.2  6.4     Edman Circle, MD 04/18/2019, 11:29 AM  Cc: [name], NP

## 2019-04-18 NOTE — Telephone Encounter (Signed)
[facility] Pre-operative Risk Assessment     Request for surgical clearance:     Endoscopy Procedure  What type of surgery is being performed?     EGD/Colonoscopy  When is this surgery scheduled?     05/02/19   What type of clearance is required ?   Cardiac Clearance  Are there any medications that need to be held prior to surgery and how long? none  Practice name and name of physician performing surgery?      [facility]  What is your office phone and fax number?      Phone- [phone]  [phone]  Anesthesia type (None, local, MAC, general) ?       MAC

## 2019-04-19 ENCOUNTER — Ambulatory Visit: Payer: Medicare Other

## 2019-04-19 ENCOUNTER — Other Ambulatory Visit: Payer: Self-pay

## 2019-04-19 ENCOUNTER — Telehealth: Payer: Self-pay

## 2019-04-19 DIAGNOSIS — D696 Thrombocytopenia, unspecified: Secondary | ICD-10-CM

## 2019-04-19 NOTE — Telephone Encounter (Signed)
   Primary Cardiologist: [name], MD  Chart reviewed as part of pre-operative protocol coverage. Patient last seen by Dr. Antoine Poche on 12/22/2018 at which time he had some mild diffuse edema but was otherwise doing well. Echo on 01/02/2019 showed LVEF of 45-50% (40-45% in 2018).  I called and spoke with patient today. He states he is doing well. He thinks his lower extremity is a little worse than normal but he has not taken his PRN Lasix in a long time. Recommended taking Lasix if needed and watching sodium/fluid intake. No shortness of breath, orthopnea, or PND. No chest pain, palpitations, lightheadedness, dizziness, or syncope. Chronic back pain limits his activity some but able to complete >4.0 METs without any angina.  Given past medical history and time since last visit, based on ACC/AHA guidelines, Brandon [name] would be at acceptable risk for the planned procedure without further cardiovascular testing as long as patient doesn't develop significant dyspnea, orthopnea, PND and chronic edema does not worsen. I suspect his edema to improve with PRN Lasix. Patient knows to call us if symptoms worsen prior to procedure.   I will route this recommendation to the requesting party via Epic fax function and remove from pre-op pool.  Please call with questions.  Corrin Parker, PA-C 04/19/2019, 10:18 AM

## 2019-04-19 NOTE — Telephone Encounter (Signed)
I have spoke to patient and patient has been informed and voiced understanding.

## 2019-04-19 NOTE — Telephone Encounter (Signed)
Patient has an appointment with Dr. Myna Hidalgo on 04/24/19 at 1:30pm. I have called patient and left a message for him to return my call. Patients procedures have been canceled.

## 2019-04-19 NOTE — Telephone Encounter (Signed)
-----   Message from [name], MD sent at 04/18/2019  6:12 PM EST ----- Please see the addendum  Needs appointment with Dr. Myna Hidalgo or Dr Dion Body -sooner the better Hold off on EGD/colonoscopy until follow-up    Addendum: I have discussed with Dr. Pollyann Savoy over the phone about an hour ago -Patient had not started hydroxychloroquine.  Hence, etiology of new onset thrombocytopenia is not clear. -She recommends hematology consultation.  We will arrange with Dr. Myna Hidalgo or Dr Dion Body. -Follow results of ultrasound (r/o liver cirrhosis) -FU in 6 weeks. -After the hematology consultation, we would consider EGD and colonoscopy.  Can proceed with cardiology clearance for now.  Will arrange EGD/colon at follow-up visit.  Thx.  Please let him know as well  RG

## 2019-04-20 NOTE — Progress Notes (Deleted)
Office Visit Note  Patient: Brandon [name]             Date of Birth: 06-Feb-1941           MRN: [id number]             PCP: [name], NP Referring: [name], NP Visit Date: 04/24/2019 Occupation: @GUAROCC @  Subjective:  No chief complaint on file.   History of Present Illness: Brandon [name] is a 79 y.o. male ***   Activities of Daily Living:  Patient reports morning stiffness for *** {minute/hour:19697}.   Patient {ACTIONS;DENIES/REPORTS:21021675::"Denies"} nocturnal pain.  Difficulty dressing/grooming: {ACTIONS;DENIES/REPORTS:21021675::"Denies"} Difficulty climbing stairs: {ACTIONS;DENIES/REPORTS:21021675::"Denies"} Difficulty getting out of chair: {ACTIONS;DENIES/REPORTS:21021675::"Denies"} Difficulty using hands for taps, buttons, cutlery, and/or writing: {ACTIONS;DENIES/REPORTS:21021675::"Denies"}  No Rheumatology ROS completed.   PMFS History:  Patient Active Problem List   Diagnosis Date Noted  . Heme positive stool 02/01/2019  . RBBB 10/03/2018  . Bilateral shoulder pain 08/24/2016  . Depression 12/09/2015  . Pain and swelling of knee 06/14/2014  . Edema 12/06/2011  . Asthma 08/13/2009  . Hyperglycemia 08/13/2009  . Obstructive sleep apnea 01/23/2009  . 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 OBESITY, UNSPECIFIED 08/26/2008  . 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 Essential hypertension 06/28/2008  . Nonischemic cardiomyopathy (HCC) 06/28/2008    Past Medical History:  Diagnosis Date  . Anemia   . Asthma    Hx of childhood asthma, disappeared for a while, then resurfaced 6-7 years ago.   . Cardiomyopathy    with a negative cardiac catheterization in the past. (EF appriximately 40-45%)   . Heme positive stool 02/01/2019  . HTN (hypertension)    x 30 years  . Obesity, unspecified   . Pneumonia   . Sleep apnea    CPAP  . Unspecified disorder resulting from impaired renal function     Family  History  Problem Relation Age of Onset  . Cancer Father        multiple melanoma  . Lupus Sister        died at 68  . Diabetes Mellitus II Sister        died from covid-19  . Asthma Daughter   . Neuropathy Sister   . Colon cancer Neg Hx   . Esophageal cancer Neg Hx    Past Surgical History:  Procedure Laterality Date  . None     Social History   Social History Narrative   Grew up in [place], attended [employer]. First wife died of breast ca in 07-01-1997. 3 children. Remarried- 8 years. 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    Pt signed designated party release granting access to [facility] to his wife [facility]. Detailed message may be left on home or cell phone. [name] August 13, 2009 11:34 am.    Immunization History  Administered Date(s) Administered  . Fluad Quad(high Dose 65+) 11/21/2018  . Influenza Split 02/24/2011, 12/03/2011  . Influenza Whole 03/16/2007, 12/29/2009  . Influenza, High Dose Seasonal PF 02/26/2015, 12/09/2015, 04/22/2017, 12/14/2017  . Influenza,inj,Quad PF,6+ Mos 02/13/2013, 11/14/2013  . Moderna SARS-COVID-2 Vaccination 04/08/2019  . Pneumococcal Conjugate-13 11/14/2013  . Pneumococcal Polysaccharide-23 08/13/2009  . Td 07/26/2006  . Tdap 09/22/2012, 05/23/2017  . Zoster 05/17/2011     Objective: Vital Signs: There were no vitals taken for this visit.   Physical Exam   Musculoskeletal Exam: ***  CDAI Exam: CDAI Score: -- Patient Global: --; Provider Global: -- Swollen: --;  Tender: -- Joint Exam 04/24/2019   No joint exam has been documented for this visit   There is currently no information documented on the homunculus. Go to the Rheumatology activity and complete the homunculus joint exam.  Investigation: No additional findings.  Imaging: No results found.  Recent Labs: Lab Results  Component Value Date   WBC 8.3 04/12/2019   HGB 12.8 (L) 04/12/2019   PLT 49 (L) 04/12/2019   NA 138 04/12/2019   K  4.3 04/12/2019   CL 105 04/12/2019   CO2 22 04/12/2019   GLUCOSE 131 (H) 04/12/2019   BUN 18 04/12/2019   CREATININE 1.40 (H) 04/12/2019   BILITOT 0.4 04/12/2019   ALKPHOS 58 01/12/2019   AST 13 04/12/2019   ALT 11 04/12/2019   PROT 6.4 04/12/2019   PROT 6.2 04/12/2019   ALBUMIN 3.2 (L) 01/12/2019   CALCIUM 9.7 04/12/2019   GFRAA 55 (L) 04/12/2019   QFTBGOLDPLUS NEGATIVE 04/12/2019    Speciality Comments: PLQ Eye Exam: 04/16/19 WNL at [facility] follow up in 1 year   Procedures:  No procedures performed Allergies: Ace inhibitors and Isosorb dinitrate-hydralazine   Assessment / Plan:     Visit Diagnoses: No diagnosis found.  Orders: No orders of the defined types were placed in this encounter.  No orders of the defined types were placed in this encounter.   Face-to-face time spent with patient was *** minutes. Greater than 50% of time was spent in counseling and coordination of care.  Follow-Up Instructions: No follow-ups on file.   Bo Merino, MD  Note - This record has been created using Editor, commissioning.  Chart creation errors have been sought, but may not always  have been located. Such creation errors do not reflect on  the standard of medical care.

## 2019-04-21 ENCOUNTER — Other Ambulatory Visit: Payer: Self-pay | Admitting: Family

## 2019-04-23 ENCOUNTER — Telehealth: Payer: Self-pay | Admitting: Gastroenterology

## 2019-04-23 ENCOUNTER — Other Ambulatory Visit: Payer: Self-pay | Admitting: Family

## 2019-04-23 DIAGNOSIS — D649 Anemia, unspecified: Secondary | ICD-10-CM

## 2019-04-23 DIAGNOSIS — D696 Thrombocytopenia, unspecified: Secondary | ICD-10-CM

## 2019-04-23 NOTE — Telephone Encounter (Signed)
Patient is returning your call.  

## 2019-04-24 ENCOUNTER — Ambulatory Visit: Payer: Medicare Other | Admitting: Rheumatology

## 2019-04-24 ENCOUNTER — Other Ambulatory Visit: Payer: Self-pay

## 2019-04-24 ENCOUNTER — Telehealth: Payer: Self-pay | Admitting: Family

## 2019-04-24 ENCOUNTER — Inpatient Hospital Stay: Payer: Medicare Other | Attending: Family | Admitting: Family

## 2019-04-24 ENCOUNTER — Encounter: Payer: Self-pay | Admitting: Family

## 2019-04-24 ENCOUNTER — Inpatient Hospital Stay: Payer: Medicare Other

## 2019-04-24 VITALS — BP 129/82 | HR 73 | Temp 96.9°F | Resp 18 | Ht 72.0 in | Wt 264.1 lb

## 2019-04-24 DIAGNOSIS — Z7982 Long term (current) use of aspirin: Secondary | ICD-10-CM | POA: Diagnosis not present

## 2019-04-24 DIAGNOSIS — Z87891 Personal history of nicotine dependence: Secondary | ICD-10-CM | POA: Diagnosis not present

## 2019-04-24 DIAGNOSIS — D649 Anemia, unspecified: Secondary | ICD-10-CM

## 2019-04-24 DIAGNOSIS — M069 Rheumatoid arthritis, unspecified: Secondary | ICD-10-CM | POA: Insufficient documentation

## 2019-04-24 DIAGNOSIS — Z79899 Other long term (current) drug therapy: Secondary | ICD-10-CM | POA: Insufficient documentation

## 2019-04-24 DIAGNOSIS — I10 Essential (primary) hypertension: Secondary | ICD-10-CM | POA: Insufficient documentation

## 2019-04-24 DIAGNOSIS — D696 Thrombocytopenia, unspecified: Secondary | ICD-10-CM

## 2019-04-24 LAB — CMP (CANCER CENTER ONLY)
ALT: 12 U/L (ref 0–44)
AST: 17 U/L (ref 15–41)
Albumin: 3.5 g/dL (ref 3.5–5.0)
Alkaline Phosphatase: 61 U/L (ref 38–126)
Anion gap: 9 (ref 5–15)
BUN: 23 mg/dL (ref 8–23)
CO2: 24 mmol/L (ref 22–32)
Calcium: 9.7 mg/dL (ref 8.9–10.3)
Chloride: 105 mmol/L (ref 98–111)
Creatinine: 1.55 mg/dL — ABNORMAL HIGH (ref 0.61–1.24)
GFR, Est AFR Am: 49 mL/min — ABNORMAL LOW (ref >60)
GFR, Estimated: 42 mL/min — ABNORMAL LOW (ref >60)
Glucose, Bld: 101 mg/dL — ABNORMAL HIGH (ref 70–99)
Potassium: 4.4 mmol/L (ref 3.5–5.1)
Sodium: 138 mmol/L (ref 135–145)
Total Bilirubin: 0.7 mg/dL (ref 0.3–1.2)
Total Protein: 7 g/dL (ref 6.5–8.1)

## 2019-04-24 LAB — RETICULOCYTES
Immature Retic Fract: 13.1 % (ref 2.3–15.9)
RBC.: 4.33 MIL/uL (ref 4.22–5.81)
Retic Count, Absolute: 48.9 10*3/uL (ref 19.0–186.0)
Retic Ct Pct: 1.1 % (ref 0.4–3.1)

## 2019-04-24 LAB — CBC WITH DIFFERENTIAL (CANCER CENTER ONLY)
Abs Immature Granulocytes: 0.1 10*3/uL — ABNORMAL HIGH (ref 0.00–0.07)
Basophils Absolute: 0.1 10*3/uL (ref 0.0–0.1)
Basophils Relative: 1 %
Eosinophils Absolute: 0.3 10*3/uL (ref 0.0–0.5)
Eosinophils Relative: 3 %
HCT: 40.7 % (ref 39.0–52.0)
Hemoglobin: 12.7 g/dL — ABNORMAL LOW (ref 13.0–17.0)
Immature Granulocytes: 1 %
Lymphocytes Relative: 18 %
Lymphs Abs: 1.8 10*3/uL (ref 0.7–4.0)
MCH: 28.8 pg (ref 26.0–34.0)
MCHC: 31.2 g/dL (ref 30.0–36.0)
MCV: 92.3 fL (ref 80.0–100.0)
Monocytes Absolute: 0.9 10*3/uL (ref 0.1–1.0)
Monocytes Relative: 9 %
Neutro Abs: 6.8 10*3/uL (ref 1.7–7.7)
Neutrophils Relative %: 68 %
Platelet Count: 285 10*3/uL (ref 150–400)
RBC: 4.41 MIL/uL (ref 4.22–5.81)
RDW: 14.2 % (ref 11.5–15.5)
WBC Count: 10 10*3/uL (ref 4.0–10.5)
nRBC: 0 % (ref 0.0–0.2)

## 2019-04-24 LAB — PLATELET BY CITRATE

## 2019-04-24 LAB — SAVE SMEAR(SSMR), FOR PROVIDER SLIDE REVIEW

## 2019-04-24 NOTE — Progress Notes (Signed)
Hematology/Oncology Consultation   Name: Brandon [name]      MRN: [id number]    Location: Room/bed info not found  Date: 04/24/2019 Time:1:52 PM   REFERRING PHYSICIAN: Jackquline Denmark, MD  REASON FOR CONSULT: Thrombocytopenia    DIAGNOSIS: Thrombocytopenia   HISTORY OF PRESENT ILLNESS: [name] is a very pleasant 79 yo African American gentleman with recent low platelet count of 49. He typically runs in the mid 200's.  He had been on Plaquenil for treatment of RA (pain in the shoulders, arms, hips and thighs)  with Dr. Estanislado Pandy. He stopped taking the Plaquenil a little over a week ago and today his platelet count is normal at 285. He follows up with her next week.  GI has abdominal US scheduled for 04/26/2019.  He has had mild anemia.  Hgb 12.7, MCV 92.  He takes 1 baby aspirin daily.  He did have stool positive for blood recently and I waiting on clearance from his cardiologist before scheduling colonoscopy.  No other blood loss has been noted. No bruising or petechiae.  He denies any family history of anemia or thrombocytopenia.  His father had Myeloma. He has no personal history of cancer.  No issue with infection. No fever, chills, n/v, cough, rash, dizziness, chest pain, palpitations, abdominal pain or changes in bowel or bladder habits.  He has some SOB at times secondary to exacerbation of asthma. He uses his prescribed inhalers as needed.   He has intermittent swelling in his lower extremities at takes lasix daily as needed.  He has occasional tingling in his feet.  He quit drinking ETOH around 6 months ago. Before that he was having a glass of wine or a beer every other night.  He does not smoke or use recreational drugs.  He has maintained a good appetite and hydrate well throughout the day. His weight is described as stable.   ROS: All other 10 point review of systems is negative.   PAST MEDICAL HISTORY:   Past Medical History:  Diagnosis Date  . Anemia   . Asthma    Hx of  childhood asthma, disappeared for a while, then resurfaced 6-7 years ago.   . Cardiomyopathy    with a negative cardiac catheterization in the past. (EF appriximately 40-45%)   . Heme positive stool 02/01/2019  . HTN (hypertension)    x 30 years  . Obesity, unspecified   . Pneumonia   . Sleep apnea    CPAP  . 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 Ace Inhibitors     REACTION: cough  . Isosorb Dinitrate-Hydralazine     REACTION: dizziness/hypotensive      MEDICATIONS:  Current Outpatient Medications on File Prior to Visit  Medication Sig Dispense Refill  . amLODipine (NORVASC) 10 MG tablet Take 1 tablet (10 mg total) by mouth daily. 90 tablet 1  . aspirin 81 MG tablet Take 81 mg by mouth daily.      Marland Kitchen b complex vitamins tablet Take 1 tablet by mouth daily.    . celecoxib (CELEBREX) 100 MG capsule Take 100 mg by mouth 2 (two) times daily.    . colchicine 0.6 MG tablet Take 2 tablets by mouth now and 1 tablet one hour later. 3 tablet 0  . diazepam (VALIUM) 5 MG tablet Take 1 tablet by mouth 60 minutes prior to MRI.  May take second tablet but only if needed. Do not take  with pain medication (hydrocodone or tramadol) 2 tablet 0  . Fluticasone-Salmeterol (ADVAIR) 500-50 MCG/DOSE AEPB INHALE 1 DOSE BY MOUTH TWICE DAILY 60 each 5  . furosemide (LASIX) 20 MG tablet Take 1 tablet (20 mg total) by mouth daily as needed. 30 tablet 3  . gabapentin (NEURONTIN) 100 MG capsule Take 1 capsule (100 mg total) by mouth 3 (three) times daily. 90 capsule 0  . HYDROcodone-acetaminophen (NORCO/VICODIN) 5-325 MG tablet Take 1 tablet by mouth every 6 (six) hours as needed for moderate pain. 20 tablet 0  . hydroxychloroquine (PLAQUENIL) 200 MG tablet Take 1 tablet (200 mg total) by mouth 2 (two) times daily. 180 tablet 0  . losartan (COZAAR) 50 MG tablet Take 1 tablet by mouth twice daily 180 tablet 0  . metoprolol succinate (TOPROL-XL)  25 MG 24 hr tablet Take 1 tablet (25 mg total) by mouth daily. 90 tablet 1  . montelukast (SINGULAIR) 10 MG tablet Take 1 tablet (10 mg total) by mouth at bedtime. 90 tablet 1  . PROAIR HFA 108 (90 Base) MCG/ACT inhaler INHALE 2 PUFFS BY MOUTH EVERY 6 HOURS AS NEEDED FOR WHEEZING AND FOR SHORTNESS OF BREATH 6.7 g 3  . sertraline (ZOLOFT) 25 MG tablet Takes 1 tab by mouth once daily for 2 weeks, then decrease to 21m every other day 30 tablet 0  . spironolactone (ALDACTONE) 25 MG tablet Take 1 tablet (25 mg total) by mouth daily. 90 tablet 1  . SUPREP BOWEL PREP KIT 17.5-3.13-1.6 GM/177ML SOLN Take 1 kit by mouth as directed. 354 mL 0  . tamsulosin (FLOMAX) 0.4 MG CAPS capsule Take 1 capsule (0.4 mg total) by mouth daily. 30 capsule 3  . [DISCONTINUED] Fluticasone-Salmeterol (ADVAIR) 500-50 MCG/DOSE AEPB INHALE 1 DOSE BY MOUTH TWICE DAILY 60 each 5   No current facility-administered medications on file prior to visit.     PAST SURGICAL HISTORY Past Surgical History:  Procedure Laterality Date  . None      FAMILY HISTORY: Family History  Problem Relation Age of Onset  . Cancer Father        multiple melanoma  . Lupus Sister        died at 217 . Diabetes Mellitus II Sister        died from covid-19  . Asthma Daughter   . Neuropathy Sister   . Colon cancer Neg Hx   . Esophageal cancer Neg Hx     SOCIAL HISTORY:  reports that he has quit smoking. He has never used smokeless tobacco. He reports current alcohol use. He reports that he does not use drugs.  PERFORMANCE STATUS: The patient's performance status is 0 - Asymptomatic  PHYSICAL EXAM: Most Recent Vital Signs: There were no vitals taken for this visit. There were no vitals taken for this visit.  General Appearance:    Alert, cooperative, no distress, appears stated age  Head:    Normocephalic, without obvious abnormality, atraumatic  Eyes:    PERRL, conjunctiva/corneas clear, EOM's intact, fundi    benign, both eyes              Throat:   Lips, mucosa, and tongue normal; teeth and gums normal  Neck:   Supple, symmetrical, trachea midline, no adenopathy;       thyroid:  No enlargement/tenderness/nodules; no carotid   bruit or JVD  Back:     Symmetric, no curvature, ROM normal, no CVA tenderness  Lungs:     Clear to auscultation bilaterally, respirations unlabored  Chest wall:    No tenderness or deformity  Heart:    Regular rate and rhythm, S1 and S2 normal, no murmur, rub   or gallop  Abdomen:     Soft, non-tender, bowel sounds active all four quadrants,    no masses, no organomegaly        Extremities:   Extremities normal, atraumatic, no cyanosis or edema  Pulses:   2+ and symmetric all extremities  Skin:   Skin color, texture, turgor normal, no rashes or lesions  Lymph nodes:   Cervical, supraclavicular, and axillary nodes normal  Neurologic:   CNII-XII intact. Normal strength, sensation and reflexes      throughout    LABORATORY DATA:  No results found for this or any previous visit (from the past 48 hour(s)).    RADIOGRAPHY: No results found.     PATHOLOGY: None   ASSESSMENT/PLAN: [name] is a very pleasant 42 yo African American gentleman with recent low platelet count of 49. He typically runs in the mid 200's.  He had been on Plaquenil prior to the lab work which may have been the source of his drop in platelet count. There may have also been an element of platelet clumping that altered the reading.  He is asymptomatic at this time and has no complaints.  He will discuss resuming Plaquenil with Dr. Estanislado Pandy next week. We do advise that his platelet count be followed closely if this is restarted.  At this point we can let him go from our office.  All questions were answered and he is in agreement with the plan. Marland Kitchen He can certainly call our office with any future heme/onc related questions or concerns. We can certainly see him again if needed.   He was discussed with and also seen by Dr.  Maylon Peppers and he is in agreement with the aforementioned.   Laverna Peace, NP   Addendum:   I interviewed and examined the patient.  I reviewed the patient's records extensively as well as NP Amarrah Meinhart's note, and agree with the plan as detailed.   In summary, patient was diagnosed with rheumatoid arthritis, for which she was prescribed Plaquenil by his rheumatologist.  He took the medication for about 30 days, and stop the medication about a week ago.  Just before stopping the medication, he went in for a routine lab check, and labs were notable for a platelet count of 49k (baseline platelet count normal).  He denies any abnormal bleeding or excess bruising.  Since then, he has stopped the Plaquenil.  Patient was referred to hematology for further evaluation.  Platelet count was 285k today.  Hgb was stable at 12.7 and WBC was normal.  I personally reviewed the patient's peripheral blood smear today.  The red blood cells were of normal morphology.  There was no schistocytosis.  The white blood cells were of normal morphology. There were no peripheral circulating blasts. The platelets were of normal size and I verified that there were no platelet clumping.  I discussed some of differential diagnosis for thrombocytopenia, including spurious lab results due to platelet clumping, drug-induced thrombocytopenia, and ITP.  While drug-induced thrombocytopenia is a possibility, the rapid recovery of platelet count within 1 week of stopping the medication is somewhat brisk.  ITP is less likely, given the spontaneous recovery.  Given the normalization of platelet count, I do not see any indication for further hematologic work-up at this time.  If the patient's rheumatologist decides to resume Plaquenil, he  would need to be monitored closely for any recurrent thrombocytopenia due to the medication.    Patient can continue follow-up with his PCP and rheumatologist.  Thank you for the opportunity to  participate in Mr. Azucena care.  The total time spent in the encounter was 55 minutes, including face-to-face time with the patient, review of various tests results, order additional studies/medications, documentation, and coordination of care plan.   Tish Men, MD 04/25/2019 9:05 AM

## 2019-04-24 NOTE — Telephone Encounter (Signed)
Per 2/9 los None

## 2019-04-25 ENCOUNTER — Encounter: Payer: Self-pay | Admitting: Family

## 2019-04-25 LAB — LACTATE DEHYDROGENASE: LDH: 199 U/L — ABNORMAL HIGH (ref 98–192)

## 2019-04-25 LAB — FERRITIN: Ferritin: 314 ng/mL (ref 24–336)

## 2019-04-25 LAB — IRON AND TIBC
Iron: 44 ug/dL (ref 42–163)
Saturation Ratios: 16 % — ABNORMAL LOW (ref 20–55)
TIBC: 276 ug/dL (ref 202–409)
UIBC: 233 ug/dL (ref 117–376)

## 2019-04-26 ENCOUNTER — Ambulatory Visit (HOSPITAL_BASED_OUTPATIENT_CLINIC_OR_DEPARTMENT_OTHER): Payer: Medicare Other

## 2019-04-27 ENCOUNTER — Encounter: Payer: Self-pay | Admitting: Cardiology

## 2019-04-27 ENCOUNTER — Other Ambulatory Visit: Payer: Self-pay

## 2019-04-27 ENCOUNTER — Ambulatory Visit: Payer: Medicare Other | Admitting: Family Medicine

## 2019-04-27 VITALS — BP 145/85 | HR 77 | Temp 97.3°F | Ht 72.0 in | Wt 266.0 lb

## 2019-04-27 DIAGNOSIS — I4510 Unspecified right bundle-branch block: Secondary | ICD-10-CM

## 2019-04-27 DIAGNOSIS — I5022 Chronic systolic (congestive) heart failure: Secondary | ICD-10-CM

## 2019-04-27 DIAGNOSIS — I10 Essential (primary) hypertension: Secondary | ICD-10-CM

## 2019-04-27 DIAGNOSIS — M069 Rheumatoid arthritis, unspecified: Secondary | ICD-10-CM | POA: Diagnosis not present

## 2019-04-27 DIAGNOSIS — I428 Other cardiomyopathies: Secondary | ICD-10-CM | POA: Diagnosis not present

## 2019-04-27 NOTE — Patient Instructions (Addendum)
Medication Instructions:  Your physician recommends that you continue on your current medications as directed. Please refer to the Current Medication list given to you today. *If you need a refill on your cardiac medications before your next appointment, please call your pharmacy*  Lab Work: None  If you have labs (blood work) drawn today and your tests are completely normal, you will receive your results only by: Marland Kitchen MyChart Message (if you have MyChart) OR . A paper copy in the mail If you have any lab test that is abnormal or we need to change your treatment, we will call you to review the results.  Testing/Procedures: None   Follow-Up: At [facility], you and your health needs are our priority.  As part of our continuing mission to provide you with exceptional heart care, we have created designated Provider Care Teams.  These Care Teams include your primary Cardiologist (physician) and Advanced Practice Providers (APPs -  Physician Assistants and Nurse Practitioners) who all work together to provide you with the care you need, when you need it.  Your next appointment:   6 month(s)  The format for your next appointment:   In Person  Provider:   [name], MD  Other Instructions

## 2019-04-27 NOTE — Progress Notes (Signed)
1   Cardiology Office Note  Date: 04/27/2019   ID: Brandon [name], DOB 11-23-1940, MRN [id number]  PCP:  [name], NP  Cardiologist:  [name], MD Electrophysiologist:  None   Chief Complaint  Patient presents with  . Follow-up    Nonischemic cardiomyopathy, hypertension, RBBB, edema    History of Present Illness: Brandon [name] is a 79 y.o. 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  Sees oncology for thrombocytopenia.  Stopped plaquenil and platelet count returned to normal at 285.  Echo January 02, 2019 showed ejection fraction of 45 to 50%.  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  Patient states he was referred to rheumatology but never saw physician.  He had been started on Plaquenil but it was DC'd due to thrombocytopenia.  He was seeing oncology for thrombocytopenia.  Last platelet measurement was 285.  Patient was referred to GI and had tentatively been scheduled for EGD, colonoscopy, and abdominal ultrasound.  Patient states he was given no information or indications for need for the studies.  He elected not to undergo these procedures at this time.  Patient has no complaints but he was concerned about recent events with rheumatology, oncology and gastroenterology experiences.  His only complaints now are related to arthritic-like pain.  He prefers not to see the rheumatologist he was scheduled to see.  States he was not examined but was started on Plaquenil without examination.  He would like to be seen by another rheumatologist if possible.  He denies any recent progressive anginal or exertional symptoms, palpitations or arrhythmias, orthostatic symptoms, stroke or TIA-like symptoms, dyspeptic symptoms, blood in stool or urine.  Denies any  claudication-like symptoms, DVT or PE-like symptoms.  Does have mild lower extremity edema.   Past Medical History:  Diagnosis Date  . Anemia   . Asthma    Hx of childhood asthma, disappeared for a while, then resurfaced 6-7 years ago.   . Cardiomyopathy    with a negative cardiac catheterization in the past. (EF appriximately 40-45%)   . Heme positive stool 02/01/2019  . HTN (hypertension)    x 30 years  . Obesity, unspecified   . Pneumonia   . Sleep apnea    CPAP  . 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 None      Current Outpatient Medications  Medication Sig Dispense Refill  . amLODipine (NORVASC) 10 MG tablet Take 1 tablet (10 mg total) by mouth daily. 90 tablet 1  . aspirin 81 MG tablet Take 81 mg by mouth daily.      Marland Kitchen b complex vitamins tablet Take 1 tablet by mouth daily.    . celecoxib (CELEBREX) 100 MG capsule Take 100 mg by mouth 2 (two) times daily.    . Fluticasone-Salmeterol (ADVAIR) 500-50 MCG/DOSE AEPB INHALE 1 DOSE BY MOUTH TWICE DAILY 60 each 5  . furosemide (LASIX) 20 MG tablet Take 1 tablet (20 mg total) by mouth daily as needed. 30 tablet 3  . gabapentin (NEURONTIN) 100 MG capsule Take 1 capsule (100 mg total) by mouth 3 (three) times daily. 90 capsule 0  . hydroxychloroquine (PLAQUENIL) 200 MG tablet Take 1 tablet (200 mg total) by mouth 2 (two) times daily. 180 tablet 0  .  losartan (COZAAR) 50 MG tablet Take 1 tablet by mouth twice daily 180 tablet 0  . metoprolol succinate (TOPROL-XL) 25 MG 24 hr tablet Take 1 tablet (25 mg total) by mouth daily. 90 tablet 1  . montelukast (SINGULAIR) 10 MG tablet Take 1 tablet (10 mg total) by mouth at bedtime. 90 tablet 1  . PROAIR HFA 108 (90 Base) MCG/ACT inhaler INHALE 2 PUFFS BY MOUTH EVERY 6 HOURS AS NEEDED FOR WHEEZING AND FOR SHORTNESS OF BREATH 6.7 g 3  . sertraline (ZOLOFT) 25 MG tablet Takes 1 tab by mouth once daily for 2 weeks,  then decrease to 25mg  every other day 30 tablet 0  . spironolactone (ALDACTONE) 25 MG tablet Take 1 tablet (25 mg total) by mouth daily. 90 tablet 1  . tamsulosin (FLOMAX) 0.4 MG CAPS capsule Take 1 capsule (0.4 mg total) by mouth daily. 30 capsule 3   No current facility-administered medications for this visit.   Allergies:  Ace inhibitors and Isosorb dinitrate-hydralazine   Social History: The patient  reports that he has quit smoking. He has never used smokeless tobacco. He reports current alcohol use. He reports that he does not use drugs.   Family History: The patient's family history includes Asthma in his daughter; Cancer in his father; Diabetes Mellitus II in his sister; Lupus in his sister; Neuropathy in his sister.   ROS:  Please see the history of present illness. Otherwise, complete review of systems is positive for none.  All other systems are reviewed and negative.   Physical Exam: VS:  There were no vitals taken for this visit., BMI There is no height or weight on file to calculate BMI.  Wt Readings from Last 3 Encounters:  04/24/19 264 lb 1.9 oz (119.8 kg)  04/18/19 271 lb 6 oz (123.1 kg)  01/30/19 252 lb (114.3 kg)    General: Patient appears comfortable at rest. Neck: Supple, no elevated JVP or carotid bruits, no thyromegaly. Lungs: Clear to auscultation, nonlabored breathing at rest. Cardiac: Regular rate and rhythm, no S3 or significant systolic murmur, no pericardial rub. Extremities: No pitting edema, distal pulses 2+. Skin: Warm and dry. Musculoskeletal: No kyphosis. Neuropsychiatric: Alert and oriented x3, affect grossly appropriate.  ECG:  An ECG dated April 27, 2019. was personally reviewed today and demonstrated:  Sinus rhythm with occasional PVCs, right bundle branch block rate of 77  Recent Labwork: 12/28/2018: BNP 141.1 01/12/2019: TSH 1.06 04/24/2019: ALT 12; AST 17; BUN 23; Creatinine 1.55; Hemoglobin 12.7; Platelet Count 285; Potassium 4.4; Sodium  138     Component Value Date/Time   CHOL 149 12/09/2015 1124   TRIG 63.0 12/09/2015 1124   HDL 50.30 12/09/2015 1124   CHOLHDL 3 12/09/2015 1124   VLDL 12.6 12/09/2015 1124   LDLCALC 86 12/09/2015 1124    Other Studies Reviewed Today:  Echocardiogram January 02, 2019  1. Left ventricular ejection fraction, by visual estimation, is 45 to 50%. The left ventricle has mildly decreased function. Mildly increased left ventricular size. There is no left ventricular hypertrophy. 2. Left ventricular diastolic Doppler parameters are indeterminate pattern of LV diastolic filling. 3. Global right ventricle has normal systolic function.The right ventricular size is mildly enlarged. No increase in right ventricular wall thickness. 4. Left atrial size was moderately dilated. 5. Right atrial size was mildly dilated. 6. The mitral valve is normal in structure. No evidence of mitral valve regurgitation. 7. The tricuspid valve is normal in structure. Tricuspid valve regurgitation was not visualized by color  flow Doppler. 8. The aortic valve is tricuspid Aortic valve regurgitation was not visualized by color flow Doppler. 9. The pulmonic valve was not well visualized. Pulmonic valve regurgitation is not visualized by color flow Doppler. 10. There is mild dilatation of the ascending aorta measuring 38 mm. 11. The inferior vena cava is normal in size with greater than 50% respiratory variability, suggesting right atrial pressure of 3 mmHg.   Assessment and Plan:  1. Nonischemic cardiomyopathy ([place])   2. RBBB   3. CHRONIC SYSTOLIC HEART FAILURE   4. Essential hypertension    1. Nonischemic cardiomyopathy ([facility]) Echocardiogram in October 2020 showed EF of 45 to 50% mild increase in left ventricular size.  No LVH.  LAE moderate, RA mildly dilated, mild dilatation of ascending aorta at 38 mm. Patient denies any significant dyspnea on exertion.  He does have mild lower extremity.  He takes Lasix as  needed for swelling.  Edema may be related to high dose Norvasc 10 mg daily. Continue Toprol XL 25 mg daily, spironolactone 25 mg daily.  2. RBBB Evidence of right bundle branch block on previous EKGs.  EKG today shows sinus rhythm with occasional PVCs and right bundle branch block rate of 77.  3. CHRONIC SYSTOLIC HEART FAILURE Reduced ejection fraction of 45 to 50%.  Currently is adhering to GDMT.  Continue beta-blocker, aldosterone inhibitor, Lasix as needed,  4. Essential hypertension Initial blood pressure on arrival today 145/85.  Recheck in left arm 122/64.  Continue losartan 50 mg twice daily.  Continue Norvasc 10 mg daily.  5. CRI    Crt 1.55 GFR 49 on 04/24/2019 .  We will continue to monitor renal function since patient is on 2 diuretics.  6. RA  Patient continues to have multijoint pain in shoulders arms hands.  He was on Plaquenil but it was stopped.  He had thrombocytopenia for brief time which has resolved thought to be due to Plaquenil use.  Refer to rheumatology  Medication Adjustments/Labs and Tests Ordered: Current medicines are reviewed at length with the patient today.  Concerns regarding medicines are outlined above.    There are no Patient Instructions on file for this visit.       Signed, Levell July, NP 04/27/2019 9:36 AM    [facility]

## 2019-04-30 ENCOUNTER — Telehealth: Payer: Self-pay | Admitting: Family

## 2019-04-30 NOTE — Telephone Encounter (Signed)
Caller Name: Bon, self Phone: [phone]  Pt called stated he did not want to return to Dr. Corliss Skains and would like referral to another [facility] affiliated office. Please enter new referral for:  [facility] [address] [place],  [place]  [address] Get Driving Directions Main: [phone]

## 2019-05-01 ENCOUNTER — Ambulatory Visit: Payer: Medicare Other | Admitting: Family

## 2019-05-01 ENCOUNTER — Other Ambulatory Visit: Payer: Self-pay

## 2019-05-01 DIAGNOSIS — M1990 Unspecified osteoarthritis, unspecified site: Secondary | ICD-10-CM

## 2019-05-01 NOTE — Telephone Encounter (Signed)
Referral entered in chart to [facility] rheumatology as requested

## 2019-05-01 NOTE — Progress Notes (Deleted)
Office Visit Note  Patient: Brandon [name]             Date of Birth: March 27, 1940           MRN: [id number]             PCP: Debbrah Alar, NP Referring: Debbrah Alar, NP Visit Date: 05/09/2019 Occupation: @GUAROCC @  Subjective:  No chief complaint on file.   History of Present Illness: Brandon [name] is a 79 y.o. male ***   Activities of Daily Living:  Patient reports morning stiffness for *** {minute/hour:19697}.   Patient {ACTIONS;DENIES/REPORTS:21021675::"Denies"} nocturnal pain.  Difficulty dressing/grooming: {ACTIONS;DENIES/REPORTS:21021675::"Denies"} Difficulty climbing stairs: {ACTIONS;DENIES/REPORTS:21021675::"Denies"} Difficulty getting out of chair: {ACTIONS;DENIES/REPORTS:21021675::"Denies"} Difficulty using hands for taps, buttons, cutlery, and/or writing: {ACTIONS;DENIES/REPORTS:21021675::"Denies"}  No Rheumatology ROS completed.   PMFS History:  Patient Active Problem List   Diagnosis Date Noted  . Heme positive stool 02/01/2019  . RBBB 10/03/2018  . Bilateral shoulder pain 08/24/2016  . Depression 12/09/2015  . Pain and swelling of knee 06/14/2014  . Edema 12/06/2011  . Asthma 08/13/2009  . Hyperglycemia 08/13/2009  . Obstructive sleep apnea 01/23/2009  . CHRONIC SYSTOLIC HEART FAILURE 81/82/9937  . OBESITY, UNSPECIFIED 08/26/2008  . 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 Essential hypertension 06/28/2008  . Nonischemic cardiomyopathy ([place]) 06/28/2008    Past Medical History:  Diagnosis Date  . Anemia   . Asthma    Hx of childhood asthma, disappeared for a while, then resurfaced 6-7 years ago.   . Cardiomyopathy    with a negative cardiac catheterization in the past. (EF appriximately 40-45%)   . Heme positive stool 02/01/2019  . HTN (hypertension)    x 30 years  . Obesity, unspecified   . Pneumonia   . Sleep apnea    CPAP  . Unspecified disorder resulting from impaired renal function     Family  History  Problem Relation Age of Onset  . Cancer Father        multiple melanoma  . Lupus Sister        died at 44  . Diabetes Mellitus II Sister        died from covid-19  . Asthma Daughter   . Neuropathy Sister   . Colon cancer Neg Hx   . Esophageal cancer Neg Hx    Past Surgical History:  Procedure Laterality Date  . None     Social History   Social History Narrative   Grew up in [place], attended [employer]. First wife died of breast ca in 1997-07-17. 3 children. Remarried- 8 years. 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    Pt signed designated party release granting access to [facility] to his wife Tonia Ghent. Detailed message may be left on home or cell phone. Shanon Payor August 13, 2009 11:34 am.    Immunization History  Administered Date(s) Administered  . Fluad Quad(high Dose 65+) 11/21/2018  . Influenza Split 02/24/2011, 12/03/2011  . Influenza Whole 03/16/2007, 12/29/2009  . Influenza, High Dose Seasonal PF 02/26/2015, 12/09/2015, 04/22/2017, 12/14/2017  . Influenza,inj,Quad PF,6+ Mos 02/13/2013, 11/14/2013  . Moderna SARS-COVID-2 Vaccination 04/08/2019  . Pneumococcal Conjugate-13 11/14/2013  . Pneumococcal Polysaccharide-23 08/13/2009  . Td 07/26/2006  . Tdap 09/22/2012, 05/23/2017  . Zoster 05/17/2011     Objective: Vital Signs: There were no vitals taken for this visit.   Physical Exam   Musculoskeletal Exam: ***  CDAI Exam: CDAI Score: - Patient Global: -; Provider Global: - Swollen: -;  Tender: - Joint Exam 05/09/2019   No joint exam has been documented for this visit   There is currently no information documented on the homunculus. Go to the Rheumatology activity and complete the homunculus joint exam.  Investigation: No additional findings.  Imaging: No results found.  Recent Labs: Lab Results  Component Value Date   WBC 10.0 04/24/2019   HGB 12.7 (L) 04/24/2019   PLT 285 04/24/2019   NA 138 04/24/2019   K 4.4  04/24/2019   CL 105 04/24/2019   CO2 24 04/24/2019   GLUCOSE 101 (H) 04/24/2019   BUN 23 04/24/2019   CREATININE 1.55 (H) 04/24/2019   BILITOT 0.7 04/24/2019   ALKPHOS 61 04/24/2019   AST 17 04/24/2019   ALT 12 04/24/2019   PROT 7.0 04/24/2019   ALBUMIN 3.5 04/24/2019   CALCIUM 9.7 04/24/2019   GFRAA 49 (L) 04/24/2019   QFTBGOLDPLUS NEGATIVE 04/12/2019  April 12, 2019 IFE negative, TB Gold negative, immunoglobulins normal, hepatitis B-, hepatitis C negative, HIV negative, G6PD normal, CK 94, anti-CCP 16, 14 3 3  eta negative  Speciality Comments: PLQ Eye Exam: 04/16/19 WNL at [facility] @ [facility] follow up in 1 year   Procedures:  No procedures performed Allergies: Ace inhibitors and Isosorb dinitrate-hydralazine   Assessment / Plan:     Visit Diagnoses: Rheumatoid arthritis with rheumatoid factor of multiple sites without organ or systems involvement (HCC)  High risk medication use  Elevated sed rate  DDD (degenerative disc disease), cervical  DDD (degenerative disc disease), lumbar  Nonischemic cardiomyopathy (HCC)  Essential hypertension  RBBB  CHRONIC SYSTOLIC HEART FAILURE  Obstructive sleep apnea  History of asthma  History of depression  Orders: No orders of the defined types were placed in this encounter.  No orders of the defined types were placed in this encounter.   Face-to-face time spent with patient was *** minutes. Greater than 50% of time was spent in counseling and coordination of care.  Follow-Up Instructions: No follow-ups on file.   [facility], MD  Note - This record has been created using Pollyann Savoy.  Chart creation errors have been sought, but may not always  have been located. Such creation errors do not reflect on  the standard of medical care.

## 2019-05-02 ENCOUNTER — Encounter: Payer: Medicare Other | Admitting: Gastroenterology

## 2019-05-04 ENCOUNTER — Telehealth: Payer: Self-pay | Admitting: Family

## 2019-05-04 NOTE — Telephone Encounter (Signed)
05/04/19 Pt dropped of [place] dmv  Form handicap renewal . Dropped off in [name] box. Pt would like to be notified via phone [phone] dthomas

## 2019-05-06 ENCOUNTER — Ambulatory Visit: Payer: Medicare Other | Attending: Internal Medicine

## 2019-05-06 DIAGNOSIS — Z23 Encounter for immunization: Secondary | ICD-10-CM

## 2019-05-06 NOTE — Progress Notes (Signed)
   Covid-19 Vaccination Clinic  Name:  Brandon [name]    MRN: [id number] DOB: 1941/01/30  05/06/2019  Brandon [name] was observed post Covid-19 immunization for 15 minutes without incidence. He was provided with Vaccine Information Sheet and instruction to access the V-Safe system.   Brandon [name] was instructed to call 911 with any severe reactions post vaccine: Marland Kitchen Difficulty breathing  . Swelling of your face and throat  . A fast heartbeat  . A bad rash all over your body  . Dizziness and weakness    Immunizations Administered    Name Date Dose VIS Date Route   Moderna COVID-19 Vaccine 05/06/2019  2:21 PM 0.5 mL 02/13/2019 Intramuscular   Manufacturer: Moderna   Lot: [id number]   NDC: 68166-196-94

## 2019-05-07 ENCOUNTER — Telehealth: Payer: Self-pay | Admitting: Family

## 2019-05-07 NOTE — Telephone Encounter (Signed)
Name : Brandon [name]  Referral  To Rheumatology, unable to schedule appointment due to lab results. Problem: needs lab result to sent [facility]  Rheumatology before they can schedule patient. Person to contact @ [facility] Rheumatology : Victorino Dike [phone] Ext :[phone]

## 2019-05-08 NOTE — Telephone Encounter (Signed)
All requested labs faxed to [facility] rheumatology

## 2019-05-08 NOTE — Telephone Encounter (Signed)
Patient was notified this morning that form was ready for pick up at the front desk

## 2019-05-09 ENCOUNTER — Ambulatory Visit: Payer: Medicare Other | Admitting: Rheumatology

## 2019-05-15 ENCOUNTER — Telehealth: Payer: Self-pay | Admitting: Family

## 2019-05-15 ENCOUNTER — Other Ambulatory Visit: Payer: Self-pay

## 2019-05-15 ENCOUNTER — Ambulatory Visit (INDEPENDENT_AMBULATORY_CARE_PROVIDER_SITE_OTHER): Payer: Medicare Other | Admitting: Family

## 2019-05-15 ENCOUNTER — Encounter: Payer: Self-pay | Admitting: Family

## 2019-05-15 VITALS — BP 137/87 | HR 75 | Temp 97.1°F | Resp 16 | Ht 72.0 in | Wt 264.0 lb

## 2019-05-15 DIAGNOSIS — J45909 Unspecified asthma, uncomplicated: Secondary | ICD-10-CM

## 2019-05-15 DIAGNOSIS — N401 Enlarged prostate with lower urinary tract symptoms: Secondary | ICD-10-CM | POA: Diagnosis not present

## 2019-05-15 DIAGNOSIS — F329 Major depressive disorder, single episode, unspecified: Secondary | ICD-10-CM

## 2019-05-15 DIAGNOSIS — I10 Essential (primary) hypertension: Secondary | ICD-10-CM | POA: Diagnosis not present

## 2019-05-15 DIAGNOSIS — F32A Depression, unspecified: Secondary | ICD-10-CM

## 2019-05-15 DIAGNOSIS — M059 Rheumatoid arthritis with rheumatoid factor, unspecified: Secondary | ICD-10-CM

## 2019-05-15 NOTE — Telephone Encounter (Signed)
Can you please call Dr. Shawnee Knapp office and ask if they could pt him on a wait list or possibly move his appointment up sooner for his RA which is poorly controlled?

## 2019-05-15 NOTE — Progress Notes (Signed)
Subjective:    Patient ID: Brandon [name], male    DOB: 26-Jul-1940, 79 y.o.   MRN: [id number]  HPI  Patient is a 79 yr old male who presents today for follow up.  HTN- maintained on amlodipine 10mg , lasix prn (uses about once a day for a few days as needed for swelling), aldactone daily, losartan.  BP Readings from Last 3 Encounters:  05/15/19 137/87  04/27/19 (!) 145/85  04/24/19 129/82   RA- was following with rheumatology. Notes joint pain- shoulder, hand, back, knee pain.  He is maintained on plaquenil. He uses tylenol as needed for pain.  He was following briefly with Dr. 06/22/19 who started plaquenil.  He states that plaquenil was ultimately discontinued so he is no longer taking. He has decided to seek a new rheumatologist and has a new patient appointment 07/03/19 with Dr. 07/05/19.  He hopes to get in sooner with him if there is a cancellation.   BPH- maintained on flomax. Reports that he is urinating without difficulty.  Lab Results  Component Value Date   PSA 3.40 01/30/2019   PSA 1.34 08/13/2009   Depression- Not taking zoloft. Reports mood is good. Denies anxiety.   Asthma- reports that asthma symptoms have been well controlled with singulair and advair.   Stopped gabapentin as he noted that it did not help his pain. He is instead using tylenol prn.   Review of Systems   See HPI  Past Medical History:  Diagnosis Date  . Anemia   . Asthma    Hx of childhood asthma, disappeared for a while, then resurfaced 6-7 years ago.   . Cardiomyopathy    with a negative cardiac catheterization in the past. (EF appriximately 40-45%)   . Heme positive stool 02/01/2019  . HTN (hypertension)    x 30 years  . Obesity, unspecified   . Pneumonia   . Sleep apnea    CPAP  . 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 Marital status: Married    Spouse name: 02/03/2019  . Number of children: 3  . Years of  education: Not on file  . Highest education level: Not on file  Occupational History  . Occupation: retired  Tobacco Use  . Smoking status: Former [name]  . Smokeless tobacco: Never Used  . Tobacco comment: quit smoling in 1988/07/17. started when 18, 1 ppd  Substance and Sexual Activity  . Alcohol use: Yes    Comment: occasionaly   . Drug use: Never  . Sexual activity: Not on file  Other Topics Concern  . Not on file  Social History Narrative   Grew up in [place], attended [employer]. First wife died of breast ca in Jul 17, 1997. 3 children. Remarried- 8 years. 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    Pt signed designated party release granting access to [facility] to his wife [facility]. Detailed message may be left on home or cell phone. [name] August 13, 2009 11:34 am.    Social Determinants of Health   Financial Resource Strain:   . Difficulty of Paying Living Expenses: Not on file  Food Insecurity:   . Worried About August 15, 2009 in the Last Year: Not on file  . Ran Out of Food in the Last Year: Not on file  Transportation Needs:   . Lack of Transportation (Medical): Not on file  . Lack of  Transportation (Non-Medical): Not on file  Physical Activity:   . Days of Exercise per Week: Not on file  . Minutes of Exercise per Session: Not on file  Stress:   . Feeling of Stress : Not on file  Social Connections:   . Frequency of Communication with Friends and Family: Not on file  . Frequency of Social Gatherings with Friends and Family: Not on file  . Attends Religious Services: Not on file  . Active Member of Clubs or Organizations: Not on file  . Attends Banker Meetings: Not on file  . Marital Status: Not on file  Intimate Partner Violence:   . Fear of Current or Ex-Partner: Not on file  . Emotionally Abused: Not on file  . Physically Abused: Not on file  . Sexually Abused: Not on file    Past Surgical History:  Procedure Laterality  Date  . None      Family History  Problem Relation Age of Onset  . Cancer Father        multiple melanoma  . Lupus Sister        died at 70  . Diabetes Mellitus II Sister        died from covid-19  . Asthma Daughter   . Neuropathy Sister   . Colon cancer Neg Hx   . Esophageal cancer Neg Hx     Allergies  Allergen Reactions  . Ace Inhibitors Cough  . Isosorb Dinitrate-Hydralazine Other (See Comments)    REACTION: dizziness/hypotensive    Current Outpatient Medications on File Prior to Visit  Medication Sig Dispense Refill  . amLODipine (NORVASC) 10 MG tablet Take 1 tablet (10 mg total) by mouth daily. 90 tablet 1  . aspirin 81 MG tablet Take 81 mg by mouth daily.      Marland Kitchen b complex vitamins tablet Take 1 tablet by mouth daily.    . Fluticasone-Salmeterol (ADVAIR) 500-50 MCG/DOSE AEPB INHALE 1 DOSE BY MOUTH TWICE DAILY 60 each 5  . furosemide (LASIX) 20 MG tablet Take 1 tablet (20 mg total) by mouth daily as needed. 30 tablet 3  . losartan (COZAAR) 50 MG tablet Take 1 tablet by mouth twice daily 180 tablet 0  . metoprolol succinate (TOPROL-XL) 25 MG 24 hr tablet Take 1 tablet (25 mg total) by mouth daily. 90 tablet 1  . montelukast (SINGULAIR) 10 MG tablet Take 1 tablet (10 mg total) by mouth at bedtime. 90 tablet 1  . PROAIR HFA 108 (90 Base) MCG/ACT inhaler INHALE 2 PUFFS BY MOUTH EVERY 6 HOURS AS NEEDED FOR WHEEZING AND FOR SHORTNESS OF BREATH 6.7 g 3  . spironolactone (ALDACTONE) 25 MG tablet Take 1 tablet (25 mg total) by mouth daily. 90 tablet 1  . tamsulosin (FLOMAX) 0.4 MG CAPS capsule Take 1 capsule (0.4 mg total) by mouth daily. 30 capsule 3  . [DISCONTINUED] Fluticasone-Salmeterol (ADVAIR) 500-50 MCG/DOSE AEPB INHALE 1 DOSE BY MOUTH TWICE DAILY 60 each 5   No current facility-administered medications on file prior to visit.    BP 137/87 (BP Location: Right Arm, Patient Position: Sitting, Cuff Size: Large)   Pulse 75   Temp (!) 97.1 F (36.2 C) (Temporal)   Resp  16   Ht 6' (1.829 m)   Wt 264 lb (119.7 kg)   SpO2 99%   BMI 35.80 kg/m        Objective:   Physical Exam Constitutional:      General: He is not in acute distress.  Appearance: He is well-developed.  HENT:     Head: Normocephalic and atraumatic.  Cardiovascular:     Rate and Rhythm: Normal rate and regular rhythm.     Heart sounds: No murmur.  Pulmonary:     Effort: Pulmonary effort is normal. No respiratory distress.     Breath sounds: Normal breath sounds. No wheezing or rales.  Musculoskeletal:     Comments: Bilateral hand swelling is noted R>L  Skin:    General: Skin is warm and dry.  Neurological:     Mental Status: He is alert and oriented to person, place, and time.  Psychiatric:        Behavior: Behavior normal.        Thought Content: Thought content normal.           Assessment & Plan:  RA- symptoms are uncontrolled. Await his new pt appointment with Dr. Amil Amen. Of note, he had a low platelet count at Dr. Arlean Hopping office which was followed by a hematology evaluation. No cause identified. His follow up platelet count was WNL.  Depression- stable without medication. Monitor.  HTN- bp stable. Continue current meds.   Asthma- stable on singulair, advair and as needed albuterol.   BPH- stable on flomax. Continue same.   This visit occurred during the SARS-CoV-2 public health emergency.  Safety protocols were in place, including screening questions prior to the visit, additional usage of staff PPE, and extensive cleaning of exam room while observing appropriate contact time as indicated for disinfecting solutions.

## 2019-05-16 ENCOUNTER — Telehealth: Payer: Self-pay | Admitting: Family

## 2019-05-16 NOTE — Telephone Encounter (Signed)
Talked to Victorino Dike the new patient coordinator at [facility] and she can get patient in sooner than 07/03/19 with one of their PA s. Phone number given to patient with her extension so he can call her and confirm that is ok with him to see PA.

## 2019-05-16 NOTE — Telephone Encounter (Signed)
Please let pt know that I was reviewing his chart and see that it has been a while since he has seen his sleep specialist (Dr. Craige Cotta) and I would recommend that he schedule a routine follow up for his sleep apnea.

## 2019-05-17 NOTE — Telephone Encounter (Signed)
Patient advised to call Dr. Craige Cotta for follow up, phone number provided.

## 2019-05-30 DIAGNOSIS — M0579 Rheumatoid arthritis with rheumatoid factor of multiple sites without organ or systems involvement: Secondary | ICD-10-CM | POA: Diagnosis not present

## 2019-05-30 DIAGNOSIS — M2550 Pain in unspecified joint: Secondary | ICD-10-CM | POA: Diagnosis not present

## 2019-05-30 DIAGNOSIS — N1832 Chronic kidney disease, stage 3b: Secondary | ICD-10-CM | POA: Diagnosis not present

## 2019-05-31 ENCOUNTER — Telehealth: Payer: Self-pay | Admitting: Cardiology

## 2019-05-31 NOTE — Telephone Encounter (Signed)
   Pt c/o medication issue:  1. Name of Medication: furosemide (LASIX) 20 MG tablet  2. How are you currently taking this medication (dosage and times per day)? As written   3. Are you having a reaction (difficulty breathing--STAT)?   4. What is your medication issue? Pt called, he said both of his legs and feet is swelling, he wanted to ask Dr. Antoine Poche is he can double up he's lasix dosage and how often he can take it to reduce the swelling.  Please advise

## 2019-05-31 NOTE — Telephone Encounter (Signed)
Spoke with patient and he is taking Lasix 20 mg daily Has swelling in both legs and feet, goes down some at night Saw rheumatologist and was advised to call Dr Antoine Poche Patient does not weigh himself at home but is planning on buying home scales.  Blood pressure runs 120's/80's Denies any shortness of breath  Watches sodium intake Advised to take an extra Lasix today and would forward to Dr Antoine Poche for further recommendations/guidelines for extra doses

## 2019-06-01 NOTE — Telephone Encounter (Signed)
Yes

## 2019-06-01 NOTE — Telephone Encounter (Signed)
Agree .  Probably needs to be seen and can be an APP.  You can ask if virtual is OK.

## 2019-06-01 NOTE — Telephone Encounter (Signed)
Spoke with patient. Dr. Antoine Poche would like patient to take an additional dose of lasix daily and follow up on Monday with an APP. Patient appointment made with Micah Flesher, PA on 06/04/19. Patient verbalized understanding.

## 2019-06-03 NOTE — Progress Notes (Signed)
Cardiology Office Note:    Date:  06/04/2019   ID:  Brandon [name], DOB [birth year], MRN [id number]  PCP:  Debbrah Alar, NP  Cardiologist:  Minus Breeding, MD   Referring MD: Debbrah Alar, NP   Chief Complaint  Patient presents with  . Follow-up    lower extremity swelling    History of Present Illness:    Brandon [name] is a 79 y.o. male with a hx of chronic systolic heart failure, nonischemic cardiomyopathy, hypertension, OSA, chronic right bundle branch block, and CKD. Recent echo 12/2018 with EF 45-50%. He has chronic mild lower extremity edema for which he takes lasix PRN. He had heme positive stool with a platelet count in the 40k range along with hepatomegaly. He was seen by hematology and GI, but he apparently did not have good experiences at either of his visits.Marland Kitchen   He called our office with bilateral lower extremity swelling asking if he could double his lasix. He was placed on my schedule for evaluation. He took 40 mg lasix x 3 days. Did not take it today. Swelling improved in the morning, worse at night. Just obtained a scales and has started weighing. In review of his weight history, I think 252 lbs is his dry weight. Prior to this, he was using lasix PRN, but admits to probably needing it more than he was using it. This swelling started more than 1 months ago and will take some time to resolve gently.   Fortunately, he denies SOB and orthopnea. Lungs clear on exam. We discussed importance of daily weights and low salt diet.   Past Medical History:  Diagnosis Date  . Anemia   . Asthma    Hx of childhood asthma, disappeared for a while, then resurfaced 6-7 years ago.   . Cardiomyopathy    with a negative cardiac catheterization in the past. (EF appriximately 40-45%)   . Heme positive stool 02/01/2019  . HTN (hypertension)    x 30 years  . Obesity, unspecified   . Pneumonia   . Sleep apnea    CPAP  . Unspecified disorder resulting from impaired renal  function     Past Surgical History:  Procedure Laterality Date  . None      Current Medications: Current Meds  Medication Sig  . amLODipine (NORVASC) 10 MG tablet Take 1 tablet (10 mg total) by mouth daily.  Marland Kitchen aspirin 81 MG tablet Take 81 mg by mouth daily.    Marland Kitchen b complex vitamins tablet Take 1 tablet by mouth daily.  . Fluticasone-Salmeterol (ADVAIR) 500-50 MCG/DOSE AEPB INHALE 1 DOSE BY MOUTH TWICE DAILY  . furosemide (LASIX) 20 MG tablet Take 1 tablet (20 mg total) by mouth daily as needed.  . leflunomide (ARAVA) 20 MG tablet Take 20 mg by mouth daily.  Marland Kitchen losartan (COZAAR) 50 MG tablet Take 1 tablet by mouth twice daily  . metoprolol succinate (TOPROL-XL) 25 MG 24 hr tablet Take 1 tablet (25 mg total) by mouth daily.  . montelukast (SINGULAIR) 10 MG tablet Take 1 tablet (10 mg total) by mouth at bedtime.  Marland Kitchen PROAIR HFA 108 (90 Base) MCG/ACT inhaler INHALE 2 PUFFS BY MOUTH EVERY 6 HOURS AS NEEDED FOR WHEEZING AND FOR SHORTNESS OF BREATH  . spironolactone (ALDACTONE) 25 MG tablet Take 1 tablet (25 mg total) by mouth daily.  . tamsulosin (FLOMAX) 0.4 MG CAPS capsule Take 1 capsule (0.4 mg total) by mouth daily.     Allergies:   Ace inhibitors and  Isosorb dinitrate-hydralazine   Social History   Socioeconomic History  . Marital status: Married    Spouse name: [name]  . Number of children: 3  . Years of education: Not on file  . Highest education level: Not on file  Occupational History  . Occupation: retired  Tobacco Use  . Smoking status: Former Games developer  . Smokeless tobacco: Never Used  . Tobacco comment: quit smoling in 07-24-1988. started when 18, 1 ppd  Substance and Sexual Activity  . Alcohol use: Yes    Comment: occasionaly   . Drug use: Never  . Sexual activity: Not on file  Other Topics Concern  . Not on file  Social History Narrative   Grew up in [place], attended [employer]. First wife died of breast ca in 1997-07-24. 3 children. Remarried- 8 years. 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    Pt signed designated party release granting access to [facility] to his wife [name]. Detailed message may be left on home or cell phone. Roselle Locus August 13, 2009 11:34 am.    Social Determinants of Health   Financial Resource Strain:   . Difficulty of Paying Living Expenses:   Food Insecurity:   . Worried About Programme researcher, broadcasting/film/video in the Last Year:   . Barista in the Last Year:   Transportation Needs:   . Freight forwarder (Medical):   Marland Kitchen Lack of Transportation (Non-Medical):   Physical Activity:   . Days of Exercise per Week:   . Minutes of Exercise per Session:   Stress:   . Feeling of Stress :   Social Connections:   . Frequency of Communication with Friends and Family:   . Frequency of Social Gatherings with Friends and Family:   . Attends Religious Services:   . Active Member of Clubs or Organizations:   . Attends Banker Meetings:   Marland Kitchen Marital Status:      Family History: The patient's family history includes Asthma in his daughter; Cancer in his father; Diabetes Mellitus II in his sister; Lupus in his sister; Neuropathy in his sister. There is no history of Colon cancer or Esophageal cancer.  ROS:   Please see the history of present illness.     All other systems reviewed and are negative.  EKGs/Labs/Other Studies Reviewed:    The following studies were reviewed today:  Echo 01/02/19; 1. Left ventricular ejection fraction, by visual estimation, is 45 to  50%. The left ventricle has mildly decreased function. Mildly increased  left ventricular size. There is no left ventricular hypertrophy.  2. Left ventricular diastolic Doppler parameters are indeterminate  pattern of LV diastolic filling.  3. Global right ventricle has normal systolic function.The right  ventricular size is mildly enlarged. No increase in right ventricular wall  thickness.  4. Left atrial size was moderately dilated.    5. Right atrial size was mildly dilated.  6. The mitral valve is normal in structure. No evidence of mitral valve  regurgitation.  7. The tricuspid valve is normal in structure. Tricuspid valve  regurgitation was not visualized by color flow Doppler.  8. The aortic valve is tricuspid Aortic valve regurgitation was not  visualized by color flow Doppler.  9. The pulmonic valve was not well visualized. Pulmonic valve  regurgitation is not visualized by color flow Doppler.  10. There is mild dilatation of the ascending aorta measuring 38 mm.  11. The inferior vena cava is  normal in size with greater than 50%  respiratory variability, suggesting right atrial pressure of 3 mmHg.   EKG:  EKG is not ordered today.    Recent Labs: 12/28/2018: BNP 141.1 01/12/2019: TSH 1.06 04/24/2019: ALT 12; BUN 23; Creatinine 1.55; Hemoglobin 12.7; Platelet Count 285; Potassium 4.4; Sodium 138  Recent Lipid Panel    Component Value Date/Time   CHOL 149 12/09/2015 1124   TRIG 63.0 12/09/2015 1124   HDL 50.30 12/09/2015 1124   CHOLHDL 3 12/09/2015 1124   VLDL 12.6 12/09/2015 1124   LDLCALC 86 12/09/2015 1124    Physical Exam:    VS:  BP 138/80   Pulse 77   Temp (!) 97.2 F (36.2 C)   Ht 6' (1.829 m)   Wt 258 lb (117 kg)   SpO2 99%   BMI 34.99 kg/m     Wt Readings from Last 3 Encounters:  06/04/19 258 lb (117 kg)  05/15/19 264 lb (119.7 kg)  04/27/19 266 lb (120.7 kg)     GEN: Well nourished, well developed in no acute distress HEENT: Normal NECK: No JVD; No carotid bruits LYMPHATICS: No lymphadenopathy CARDIAC: RRR, no murmurs, rubs, gallops RESPIRATORY:  Clear to auscultation without rales, wheezing or rhonchi  ABDOMEN: Soft, non-tender, non-distended MUSCULOSKELETAL:  B LE edema with redness  SKIN: Warm and dry NEUROLOGIC:  Alert and oriented x 3 PSYCHIATRIC:  Normal affect   ASSESSMENT:    1. Nonischemic cardiomyopathy (HCC)   2. Acute on chronic systolic heart failure  (HCC)   3. Localized swelling of left lower extremity   4. Essential hypertension   5. Stage 3a chronic kidney disease   6. OSA on CPAP    PLAN:    In order of problems listed above:  Acute on Chronic systolic heart failure Nonischemic cardiomyopathy Lower extremity swelling - amlodipine, losaran, toprol, and spironolactone - increase lasix to 40 mg qAM and 20 mg q lunch x 4 days, then 40 mg q AM x 4 days, then follow up with [place] - collect BMP and BNP today - I did not add potassium supplement given his spiro and renal function - will adjust based on labs today   Hypertension - medications as above   CKD stage III - BMP today   OSA on CPAP - compliant   Follow up end of next week for repeat labs and swelling.   Medication Adjustments/Labs and Tests Ordered: Current medicines are reviewed at length with the patient today.  Concerns regarding medicines are outlined above.  Orders Placed This Encounter  Procedures  . Basic metabolic panel  . Brain natriuretic peptide   No orders of the defined types were placed in this encounter.   Signed, Marcelino Duster, [place]  06/04/2019 4:47 PM    [facility]

## 2019-06-04 ENCOUNTER — Ambulatory Visit: Payer: Medicare Other | Admitting: Physician Assistant

## 2019-06-04 ENCOUNTER — Encounter: Payer: Self-pay | Admitting: Physician Assistant

## 2019-06-04 ENCOUNTER — Other Ambulatory Visit: Payer: Self-pay

## 2019-06-04 VITALS — BP 138/80 | HR 77 | Temp 97.2°F | Ht 72.0 in | Wt 258.0 lb

## 2019-06-04 DIAGNOSIS — G4733 Obstructive sleep apnea (adult) (pediatric): Secondary | ICD-10-CM

## 2019-06-04 DIAGNOSIS — I10 Essential (primary) hypertension: Secondary | ICD-10-CM

## 2019-06-04 DIAGNOSIS — I5023 Acute on chronic systolic (congestive) heart failure: Secondary | ICD-10-CM

## 2019-06-04 DIAGNOSIS — R2242 Localized swelling, mass and lump, left lower limb: Secondary | ICD-10-CM

## 2019-06-04 DIAGNOSIS — N1831 Chronic kidney disease, stage 3a: Secondary | ICD-10-CM

## 2019-06-04 DIAGNOSIS — I509 Heart failure, unspecified: Secondary | ICD-10-CM | POA: Diagnosis not present

## 2019-06-04 DIAGNOSIS — Z9989 Dependence on other enabling machines and devices: Secondary | ICD-10-CM

## 2019-06-04 DIAGNOSIS — I428 Other cardiomyopathies: Secondary | ICD-10-CM | POA: Diagnosis not present

## 2019-06-04 NOTE — Patient Instructions (Addendum)
Medication Instructions:  Take 40mg  (2tabs) of Lasix in the morning and 20mg  (1tab) of Lasix at lunchtime for 4 days. On Saturday start Lasix 40mg  (2tabs) in the morning for 4 days then resume 20mg  daily of Lasix *If you need a refill on your cardiac medications before your next appointment, please call your pharmacy*  Lab Work: Your physician recommends that you have lab work today (BMP, BNP) If you have labs (blood work) drawn today and your tests are completely normal, you will receive your results only by: MyChart Message (if you have MyChart) OR . A paper copy in the mail If you have any lab test that is abnormal or we need to change your treatment, we will call you to review the results.  Testing/Procedures: None needed this visit  Follow-Up: At [facility], you and your health needs are our priority.  As part of our continuing mission to provide you with exceptional heart care, we have created designated Provider Care Teams.  These Care Teams include your primary Cardiologist (physician) and Advanced Practice Providers (APPs -  Physician Assistants and Nurse Practitioners) who all work together to provide you with the care you need, when you need it.   Your next appointment:   Thursday or Friday next week  The format for your next appointment:   In Person  Provider:   Marland Kitchen [facility] [place] Friday  Other Instructions  Start daily weights  We recommend signing up for the patient portal called "MyChart".  Sign up information is provided on this After Visit Summary.  MyChart is used to connect with patients for Virtual Visits (Telemedicine).  Patients are able to view lab/test results, encounter notes, upcoming appointments, etc.  Non-urgent messages can be sent to your provider as well.   To learn more about what you can do with MyChart, go to Wednesday.

## 2019-06-05 LAB — BASIC METABOLIC PANEL
BUN/Creatinine Ratio: 13 (ref 10–24)
BUN: 19 mg/dL (ref 8–27)
CO2: 25 mmol/L (ref 20–29)
Calcium: 9.7 mg/dL (ref 8.6–10.2)
Chloride: 105 mmol/L (ref 96–106)
Creatinine, Ser: 1.44 mg/dL — ABNORMAL HIGH (ref 0.76–1.27)
GFR calc Af Amer: 53 mL/min/{1.73_m2} — ABNORMAL LOW (ref >59)
GFR calc non Af Amer: 46 mL/min/{1.73_m2} — ABNORMAL LOW (ref >59)
Glucose: 101 mg/dL — ABNORMAL HIGH (ref 65–99)
Potassium: 4.6 mmol/L (ref 3.5–5.2)
Sodium: 141 mmol/L (ref 134–144)

## 2019-06-05 LAB — BRAIN NATRIURETIC PEPTIDE: BNP: 157.9 pg/mL — ABNORMAL HIGH (ref 0.0–100.0)

## 2019-06-15 ENCOUNTER — Encounter: Payer: Self-pay | Admitting: Physician Assistant

## 2019-06-15 ENCOUNTER — Other Ambulatory Visit: Payer: Self-pay

## 2019-06-15 ENCOUNTER — Ambulatory Visit: Payer: Medicare Other | Admitting: Physician Assistant

## 2019-06-15 VITALS — BP 128/84 | HR 64 | Ht 72.0 in | Wt 255.8 lb

## 2019-06-15 DIAGNOSIS — Z79899 Other long term (current) drug therapy: Secondary | ICD-10-CM | POA: Diagnosis not present

## 2019-06-15 DIAGNOSIS — I5022 Chronic systolic (congestive) heart failure: Secondary | ICD-10-CM

## 2019-06-15 DIAGNOSIS — I428 Other cardiomyopathies: Secondary | ICD-10-CM

## 2019-06-15 DIAGNOSIS — G4733 Obstructive sleep apnea (adult) (pediatric): Secondary | ICD-10-CM

## 2019-06-15 DIAGNOSIS — R2242 Localized swelling, mass and lump, left lower limb: Secondary | ICD-10-CM | POA: Diagnosis not present

## 2019-06-15 DIAGNOSIS — N183 Chronic kidney disease, stage 3 unspecified: Secondary | ICD-10-CM

## 2019-06-15 DIAGNOSIS — Z9989 Dependence on other enabling machines and devices: Secondary | ICD-10-CM

## 2019-06-15 DIAGNOSIS — I10 Essential (primary) hypertension: Secondary | ICD-10-CM

## 2019-06-15 MED ORDER — FUROSEMIDE 40 MG PO TABS: 40.0000 mg | ORAL_TABLET | Freq: Every day | ORAL | 1 refills | Status: DC

## 2019-06-15 NOTE — Patient Instructions (Addendum)
Medication Instructions:   Continue Lasix 40 mg daily  *If you need a refill on your cardiac medications before your next appointment, please call your pharmacy*  Lab Work: Your physician recommends that you return for lab work in TODAY:   BMET If you have labs (blood work) drawn today and your tests are completely normal, you will receive your results only by: Marland Kitchen MyChart Message (if you have MyChart) OR . A paper copy in the mail If you have any lab test that is abnormal or we need to change your treatment, we will call you to review the results.  Testing/Procedures: NONE ordered at this time of appointment   Follow-Up: At [facility], you and your health needs are our priority.  As part of our continuing mission to provide you with exceptional heart care, we have created designated Provider Care Teams.  These Care Teams include your primary Cardiologist (physician) and Advanced Practice Providers (APPs -  Physician Assistants and Nurse Practitioners) who all work together to provide you with the care you need, when you need it.  We recommend signing up for the patient portal called "MyChart".  Sign up information is provided on this After Visit Summary.  MyChart is used to connect with patients for Virtual Visits (Telemedicine).  Patients are able to view lab/test results, encounter notes, upcoming appointments, etc.  Non-urgent messages can be sent to your provider as well.   To learn more about what you can do with MyChart, go to [email or web address].    Your next appointment:   5-6 month(s)  The format for your next appointment:   In Person  Provider:   [name], MD  Other Instructions  Keep your luid intake to a minimum of 32 oz to a maximum of 64 oz.  Leg elevation at rest to help control leg edema during the day.

## 2019-06-15 NOTE — Progress Notes (Signed)
Cardiology Office Note:    Date:  06/17/2019   ID:  Brandon [name], DOB 04/28/1940, MRN [id number]  PCP:  Brandon Alar, NP  Cardiologist:  Brandon Breeding, MD  Electrophysiologist:  None   Referring MD: Brandon Alar, NP   Chief Complaint  Patient presents with  . Follow-up    seen for Dr. Percival [name]    History of Present Illness:    Brandon [name] is a 79 y.o. male with a hx of chronic systolic heart failure, NICM, HTN, OSA on CPAP, chronic RBBB and CKD stage III.  Previous echocardiogram in 2018 showed EF 45%.  Last echocardiogram obtained on 12/2018 showed EF 45 to 50%.  He has chronic mild lower extremity edema which he takes as needed Lasix.  He is being followed by hematology and GI for heme positive stool with low platelet count and hepatomegaly.  He was most recently seen by Brandon Sharp PA-C on 06/04/2019 due to low extremity edema.  He had taken 40 mg Lasix for 3 days prior to office visit.  According to Brandon [name]'s note, he felt the patient's dry weight was around 252 pounds.  His Lasix was increased to 60 mg daily for 4 days then 40 mg daily for 4 days.  He presents today for follow-up.  On physical exam, he has 1+ pedal pitting edema in the left lower extremity, no edema in the right lower extremity.  According to the patient, he always has a little bit more swelling in the left leg when compared to the right leg.  After using the higher dose of diuretic, his swelling has slightly improved.  He is now back on the 40 mg daily of Lasix.  I recommended continue on the current therapy.  I will obtain basic metabolic panel to make sure he is not being dehydrated.  He also takes spironolactone as well.  We discussed leg elevation and compression stocking.  Unfortunately given significant arthritis, he cannot tolerate compression stocking.  I recommend he keep his daily fluid intake to between 32 to 64 ounces.  Otherwise he is doing well and can follow-up with Dr. Percival [name] in 5 to  6 months.  Otherwise he denies any orthopnea, PND, chest pain or significant shortness of breath.  Past Medical History:  Diagnosis Date  . Anemia   . Asthma    Hx of childhood asthma, disappeared for a while, then resurfaced 6-7 years ago.   . Cardiomyopathy    with a negative cardiac catheterization in the past. (EF appriximately 40-45%)   . Heme positive stool 02/01/2019  . HTN (hypertension)    x 30 years  . Obesity, unspecified   . Pneumonia   . Sleep apnea    CPAP  . 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 None      Current Medications: Current Meds  Medication Sig  . amLODipine (NORVASC) 10 MG tablet Take 1 tablet (10 mg total) by mouth daily.  Marland Kitchen aspirin 81 MG tablet Take 81 mg by mouth daily.    Marland Kitchen b complex vitamins tablet Take 1 tablet by mouth daily.  . Fluticasone-Salmeterol (ADVAIR) 500-50 MCG/DOSE AEPB INHALE 1 DOSE BY MOUTH TWICE DAILY  . furosemide (LASIX) 40 MG tablet Take 1 tablet (40 mg total) by mouth daily.  Marland Kitchen leflunomide (ARAVA) 20 MG tablet Take 20 mg by mouth daily.  Marland Kitchen losartan (COZAAR) 50 MG tablet Take 1 tablet by mouth  twice daily  . metoprolol succinate (TOPROL-XL) 25 MG 24 hr tablet Take 1 tablet (25 mg total) by mouth daily.  . montelukast (SINGULAIR) 10 MG tablet Take 1 tablet (10 mg total) by mouth at bedtime.  Marland Kitchen PROAIR HFA 108 (90 Base) MCG/ACT inhaler INHALE 2 PUFFS BY MOUTH EVERY 6 HOURS AS NEEDED FOR WHEEZING AND FOR SHORTNESS OF BREATH  . spironolactone (ALDACTONE) 25 MG tablet Take 1 tablet (25 mg total) by mouth daily.  . tamsulosin (FLOMAX) 0.4 MG CAPS capsule Take 1 capsule (0.4 mg total) by mouth daily.  . [DISCONTINUED] furosemide (LASIX) 20 MG tablet Take 1 tablet (20 mg total) by mouth daily as needed. (Patient taking differently: Take 40 mg by mouth daily as needed. )     Allergies:   Ace inhibitors and Isosorb dinitrate-hydralazine   Social History    Socioeconomic History  . Marital status: Married    Spouse name: Brandon [name]  . Number of children: 3  . Years of education: Not on file  . Highest education level: Not on file  Occupational History  . Occupation: retired  Tobacco Use  . Smoking status: Former Games developer  . Smokeless tobacco: Never Used  . Tobacco comment: quit smoling in 07/13/1988. started when 18, 1 ppd  Substance and Sexual Activity  . Alcohol use: Yes    Comment: occasionaly   . Drug use: Never  . Sexual activity: Not on file  Other Topics Concern  . Not on file  Social History Narrative   Grew up in [place], attended [employer]. First wife died of breast ca in 1997/07/13. 3 children. Remarried- 8 years. 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    Pt signed designated party release granting access to [facility] to his wife Brandon [name]. Detailed message may be left on home or cell phone. Brandon [name] August 13, 2009 11:34 am.    Social Determinants of Health   Financial Resource Strain:   . Difficulty of Paying Living Expenses:   Food Insecurity:   . Worried About Programme researcher, broadcasting/film/video in the Last Year:   . Barista in the Last Year:   Transportation Needs:   . Freight forwarder (Medical):   Marland Kitchen Lack of Transportation (Non-Medical):   Physical Activity:   . Days of Exercise per Week:   . Minutes of Exercise per Session:   Stress:   . Feeling of Stress :   Social Connections:   . Frequency of Communication with Friends and Family:   . Frequency of Social Gatherings with Friends and Family:   . Attends Religious Services:   . Active Member of Clubs or Organizations:   . Attends Banker Meetings:   Marland Kitchen Marital Status:      Family History: The patient's family history includes Asthma in his daughter; Cancer in his father; Diabetes Mellitus II in his sister; Lupus in his sister; Neuropathy in his sister. There is no history of Colon cancer or Esophageal cancer.  ROS:   Please see  the history of present illness.     All other systems reviewed and are negative.  EKGs/Labs/Other Studies Reviewed:    The following studies were reviewed today:  Echo 01/02/2019 1. Left ventricular ejection fraction, by visual estimation, is 45 to  50%. The left ventricle has mildly decreased function. Mildly increased  left ventricular size. There is no left ventricular hypertrophy.  2. Left ventricular diastolic Doppler parameters are indeterminate  pattern of  LV diastolic filling.  3. Global right ventricle has normal systolic function.The right  ventricular size is mildly enlarged. No increase in right ventricular wall  thickness.  4. Left atrial size was moderately dilated.  5. Right atrial size was mildly dilated.  6. The mitral valve is normal in structure. No evidence of mitral valve  regurgitation.  7. The tricuspid valve is normal in structure. Tricuspid valve  regurgitation was not visualized by color flow Doppler.  8. The aortic valve is tricuspid Aortic valve regurgitation was not  visualized by color flow Doppler.  9. The pulmonic valve was not well visualized. Pulmonic valve  regurgitation is not visualized by color flow Doppler.  10. There is mild dilatation of the ascending aorta measuring 38 mm.  11. The inferior vena cava is normal in size with greater than 50%  respiratory variability, suggesting right atrial pressure of 3 mmHg.  EKG:  EKG is not ordered today.    Recent Labs: 01/12/2019: TSH 1.06 04/24/2019: ALT 12; Hemoglobin 12.7; Platelet Count 285 06/04/2019: BNP 157.9 06/15/2019: BUN 17; Creatinine, Ser 1.61; Potassium 4.8; Sodium 139  Recent Lipid Panel    Component Value Date/Time   CHOL 149 12/09/2015 1124   TRIG 63.0 12/09/2015 1124   HDL 50.30 12/09/2015 1124   CHOLHDL 3 12/09/2015 1124   VLDL 12.6 12/09/2015 1124   LDLCALC 86 12/09/2015 1124    Physical Exam:    VS:  BP 128/84   Pulse 64   Ht 6' (1.829 m)   Wt 255 lb 12.8 oz  (116 kg)   SpO2 97%   BMI 34.69 kg/m     Wt Readings from Last 3 Encounters:  06/15/19 255 lb 12.8 oz (116 kg)  06/04/19 258 lb (117 kg)  05/15/19 264 lb (119.7 kg)     GEN:  Well nourished, well developed in no acute distress HEENT: Normal NECK: No JVD; No carotid bruits LYMPHATICS: No lymphadenopathy CARDIAC: RRR, no murmurs, rubs, gallops RESPIRATORY:  Clear to auscultation without rales, wheezing or rhonchi  ABDOMEN: Soft, non-tender, non-distended MUSCULOSKELETAL:  1+ LLE edema; No deformity  SKIN: Warm and dry NEUROLOGIC:  Alert and oriented x 3 PSYCHIATRIC:  Normal affect   ASSESSMENT:    1. Localized swelling of left lower extremity   2. Medication management   3. CHRONIC SYSTOLIC HEART FAILURE   4. NICM (nonischemic cardiomyopathy) (HCC)   5. Essential hypertension   6. OSA on CPAP   7. Stage 3 chronic kidney disease, unspecified whether stage 3a or 3b CKD    PLAN:    In order of problems listed above:  1. Leg edema: He has mild left lower extremity edema, no significant edema in the right lower extremity.  He apparently always have some more swelling on the left side when compared to the right side.  At this time, I think he is euvolemic, therefore I do not recommend any up titration of medication.  I recommended him to stay on the 40 mg daily of Lasix.  We will obtain a basic metabolic panel.  2. Chronic systolic heart failure: On Toprol-XL, Cozaar along with spironolactone.  Euvolemic on physical exam  3. Nonischemic cardiomyopathy: See #2, on appropriate heart failure medication.  4. Hypertension: Blood pressure stable  5. Obstructive sleep apnea: On CPAP  6. CKD stage III: Obtain basic metabolic panel   Medication Adjustments/Labs and Tests Ordered: Current medicines are reviewed at length with the patient today.  Concerns regarding medicines are outlined above.  Orders Placed This Encounter  Procedures  . Basic metabolic panel   Meds ordered this  encounter  Medications  . furosemide (LASIX) 40 MG tablet    Sig: Take 1 tablet (40 mg total) by mouth daily.    Dispense:  90 tablet    Refill:  1    Patient Instructions  Medication Instructions:   Continue Lasix 40 mg daily  *If you need a refill on your cardiac medications before your next appointment, please call your pharmacy*  Lab Work: Your physician recommends that you return for lab work in TODAY:   BMET If you have labs (blood work) drawn today and your tests are completely normal, you will receive your results only by: Marland Kitchen MyChart Message (if you have MyChart) OR . A paper copy in the mail If you have any lab test that is abnormal or we need to change your treatment, we will call you to review the results.  Testing/Procedures: NONE ordered at this time of appointment   Follow-Up: At [facility], you and your health needs are our priority.  As part of our continuing mission to provide you with exceptional heart care, we have created designated Provider Care Teams.  These Care Teams include your primary Cardiologist (physician) and Advanced Practice Providers (APPs -  Physician Assistants and Nurse Practitioners) who all work together to provide you with the care you need, when you need it.  We recommend signing up for the patient portal called "MyChart".  Sign up information is provided on this After Visit Summary.  MyChart is used to connect with patients for Virtual Visits (Telemedicine).  Patients are able to view lab/test results, encounter notes, upcoming appointments, etc.  Non-urgent messages can be sent to your provider as well.   To learn more about what you can do with MyChart, go to [email or web address].    Your next appointment:   5-6 month(s)  The format for your next appointment:   In Person  Provider:   [name], MD  Other Instructions  Keep your luid intake to a minimum of 32 oz to a maximum of 64 oz.  Leg elevation at rest to help  control leg edema during the day.     Ramond Dial, [place]  06/17/2019 11:14 PM    [facility]

## 2019-06-16 LAB — BASIC METABOLIC PANEL
BUN/Creatinine Ratio: 11 (ref 10–24)
BUN: 17 mg/dL (ref 8–27)
CO2: 20 mmol/L (ref 20–29)
Calcium: 10.2 mg/dL (ref 8.6–10.2)
Chloride: 103 mmol/L (ref 96–106)
Creatinine, Ser: 1.61 mg/dL — ABNORMAL HIGH (ref 0.76–1.27)
GFR calc Af Amer: 47 mL/min/{1.73_m2} — ABNORMAL LOW (ref >59)
GFR calc non Af Amer: 40 mL/min/{1.73_m2} — ABNORMAL LOW (ref >59)
Glucose: 93 mg/dL (ref 65–99)
Potassium: 4.8 mmol/L (ref 3.5–5.2)
Sodium: 139 mmol/L (ref 134–144)

## 2019-06-17 ENCOUNTER — Encounter: Payer: Self-pay | Admitting: Physician Assistant

## 2019-06-21 NOTE — Progress Notes (Signed)
Stable renal function and electrolyte

## 2019-06-28 DIAGNOSIS — M0579 Rheumatoid arthritis with rheumatoid factor of multiple sites without organ or systems involvement: Secondary | ICD-10-CM | POA: Diagnosis not present

## 2019-06-28 DIAGNOSIS — N1832 Chronic kidney disease, stage 3b: Secondary | ICD-10-CM | POA: Diagnosis not present

## 2019-06-29 ENCOUNTER — Other Ambulatory Visit: Payer: Self-pay | Admitting: Family

## 2019-07-10 ENCOUNTER — Telehealth: Payer: Self-pay | Admitting: Family

## 2019-07-10 DIAGNOSIS — L853 Xerosis cutis: Secondary | ICD-10-CM

## 2019-07-10 NOTE — Telephone Encounter (Signed)
Referral requested

## 2019-07-10 NOTE — Telephone Encounter (Signed)
Caller : Brandon [name]  Call Back # [phone]  Subject : Dermatologist Referral   Per patient he has flaky skin of his leg and would like a referral to a dermatologist.

## 2019-07-18 ENCOUNTER — Other Ambulatory Visit: Payer: Self-pay | Admitting: Family

## 2019-07-27 ENCOUNTER — Telehealth: Payer: Self-pay | Admitting: Cardiology

## 2019-07-27 NOTE — Telephone Encounter (Signed)
Called patient to get appointment scheduled, phone just rings no voicemail

## 2019-08-03 ENCOUNTER — Other Ambulatory Visit: Payer: Self-pay | Admitting: Family

## 2019-08-12 ENCOUNTER — Other Ambulatory Visit: Payer: Self-pay | Admitting: Family

## 2019-08-15 DIAGNOSIS — L270 Generalized skin eruption due to drugs and medicaments taken internally: Secondary | ICD-10-CM | POA: Diagnosis not present

## 2019-08-21 ENCOUNTER — Other Ambulatory Visit: Payer: Self-pay | Admitting: Family

## 2019-08-22 NOTE — Telephone Encounter (Signed)
I don't see that he has been on this in 1 year.  Why is he requesting refill? Is he having symptoms of infection?

## 2019-08-23 ENCOUNTER — Telehealth: Payer: Self-pay

## 2019-08-23 MED ORDER — CLINDAMYCIN HCL 300 MG PO CAPS: 300.0000 mg | ORAL_CAPSULE | Freq: Three times a day (TID) | ORAL | 0 refills | Status: DC

## 2019-08-23 NOTE — Telephone Encounter (Signed)
I sent the antibiotic but I think it is very important that he see his dentist.  If he has been vaccinated then I think benefit outweighs risk for him to see the dentist.

## 2019-08-23 NOTE — Telephone Encounter (Signed)
Patient reports is having a tooth ache and was given clindamycin last year by [facility] for the same reason. He is very hesitant to go see his dentist due to covid and wanted to know if she can treat with antibiotic again.

## 2019-08-23 NOTE — Telephone Encounter (Signed)
Patient advised that rx was sent in and provider's comments. He is very appreciative for the rx and will take her advise into consideration.

## 2019-09-03 NOTE — Telephone Encounter (Signed)
Per patient he did not request this medication.

## 2019-09-12 DIAGNOSIS — L270 Generalized skin eruption due to drugs and medicaments taken internally: Secondary | ICD-10-CM | POA: Diagnosis not present

## 2019-09-12 DIAGNOSIS — L853 Xerosis cutis: Secondary | ICD-10-CM | POA: Diagnosis not present

## 2019-09-12 DIAGNOSIS — L810 Postinflammatory hyperpigmentation: Secondary | ICD-10-CM | POA: Diagnosis not present

## 2019-09-13 DIAGNOSIS — M0579 Rheumatoid arthritis with rheumatoid factor of multiple sites without organ or systems involvement: Secondary | ICD-10-CM | POA: Diagnosis not present

## 2019-09-13 DIAGNOSIS — N1832 Chronic kidney disease, stage 3b: Secondary | ICD-10-CM | POA: Diagnosis not present

## 2019-09-13 DIAGNOSIS — R21 Rash and other nonspecific skin eruption: Secondary | ICD-10-CM | POA: Diagnosis not present

## 2019-09-21 ENCOUNTER — Other Ambulatory Visit: Payer: Self-pay | Admitting: Family

## 2019-10-03 ENCOUNTER — Other Ambulatory Visit: Payer: Self-pay | Admitting: Family

## 2019-10-17 DIAGNOSIS — N1831 Chronic kidney disease, stage 3a: Secondary | ICD-10-CM | POA: Insufficient documentation

## 2019-10-17 DIAGNOSIS — Z7189 Other specified counseling: Secondary | ICD-10-CM | POA: Insufficient documentation

## 2019-10-17 DIAGNOSIS — M7989 Other specified soft tissue disorders: Secondary | ICD-10-CM | POA: Insufficient documentation

## 2019-10-17 NOTE — Progress Notes (Signed)
Cardiology Office Note   Date:  10/18/2019   ID:  Brandon [name], DOB 03-07-1941, MRN [id number]  PCP:  Brandon Craze, NP  Cardiologist:   [name], MD   Chief Complaint  Patient presents with  . Shortness of Breath      History of Present Illness: Brandon [name] is a 79 y.o. male who presents for follow up of cardiomyopathy with an EF in the past of 45 - 50%.  He has been seen since I last saw him for management of lower extremity swelling.  He has had problems with his left hip.  He is having this managed by rheumatologist and says this is better.  He did take some extra Lasix for a while because of his lower extremity swelling but he is now only taking this as needed.  He says this has been improved.  He does get dyspneic with exertion and has been using his inhaler a little bit more.  However, he is not describing PND or orthopnea.  He has no new chest discomfort, neck or arm discomfort.  He has had some weight loss.    Past Medical History:  Diagnosis Date  . Anemia   . Asthma    Hx of childhood asthma, disappeared for a while, then resurfaced 6-7 years ago.   . Cardiomyopathy    with a negative cardiac catheterization in the past. (EF appriximately 40-45%)   . Heme positive stool 02/01/2019  . HTN (hypertension)    x 30 years  . Obesity, unspecified   . Pneumonia   . Sleep apnea    CPAP  . 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 None       Current Outpatient Medications  Medication Sig Dispense Refill  . amLODipine (NORVASC) 10 MG tablet Take 1 tablet by mouth once daily 90 tablet 0  . aspirin 81 MG tablet Take 81 mg by mouth daily.      Marland Kitchen b complex vitamins tablet Take 1 tablet by mouth daily.    . clindamycin (CLEOCIN) 300 MG capsule Take 1 capsule (300 mg total) by mouth 3 (three) times daily. 21 capsule 0  . Fluticasone-Salmeterol (ADVAIR) 500-50 MCG/DOSE AEPB  INHALE 1 DOSE BY MOUTH TWICE DAILY 60 each 5  . furosemide (LASIX) 40 MG tablet Take 1 tablet (40 mg total) by mouth daily. 90 tablet 1  . leflunomide (ARAVA) 20 MG tablet Take 20 mg by mouth daily.    . metoprolol succinate (TOPROL-XL) 25 MG 24 hr tablet Take 1 tablet by mouth once daily 90 tablet 0  . montelukast (SINGULAIR) 10 MG tablet TAKE 1 TABLET BY MOUTH AT BEDTIME 90 tablet 0  . PROAIR HFA 108 (90 Base) MCG/ACT inhaler INHALE 2 PUFFS BY MOUTH EVERY 6 HOURS AS NEEDED FOR WHEEZING AND SHORTNESS OF BREATH 9 g 0  . spironolactone (ALDACTONE) 25 MG tablet Take 1 tablet by mouth once daily 90 tablet 0  . tamsulosin (FLOMAX) 0.4 MG CAPS capsule Take 1 capsule by mouth once daily 30 capsule 5  . sacubitril-valsartan (ENTRESTO) 49-51 MG Take 1 tablet by mouth 2 (two) times daily. 60 tablet 6   No current facility-administered medications for this visit.    Allergies:   Ace inhibitors and Isosorb dinitrate-hydralazine    ROS:  Please see the history of present illness.   Otherwise, review of systems are positive for none.  All other systems are reviewed and negative.    PHYSICAL EXAM: VS:  BP 125/74   Pulse 73   Temp (!) 97.2 F (36.2 C)   Ht 6\' 1"  (1.854 m)   Wt 250 lb (113.4 kg)   SpO2 97%   BMI 32.98 kg/m  , BMI Body mass index is 32.98 kg/m. GENERAL:  Well appearing NECK:  No jugular venous distention, waveform within normal limits, carotid upstroke brisk and symmetric, no bruits, no thyromegaly LUNGS:  Clear to auscultation bilaterally CHEST:  Unremarkable HEART:  PMI not displaced or sustained,S1 and S2 within normal limits, no S3, no S4, no clicks, no rubs, no murmurs ABD:  Flat, positive bowel sounds normal in frequency in pitch, no bruits, no rebound, no guarding, no midline pulsatile mass, no hepatomegaly, no splenomegaly EXT:  2 plus pulses throughout, mild left leg edema, no cyanosis no clubbing   EKG:  EKG is not ordered today.    Recent Labs: 01/12/2019: TSH  1.06 04/24/2019: ALT 12; Hemoglobin 12.7; Platelet Count 285 06/04/2019: BNP 157.9 06/15/2019: BUN 17; Creatinine, Ser 1.61; Potassium 4.8; Sodium 139    Lipid Panel    Component Value Date/Time   CHOL 149 12/09/2015 1124   TRIG 63.0 12/09/2015 1124   HDL 50.30 12/09/2015 1124   CHOLHDL 3 12/09/2015 1124   VLDL 12.6 12/09/2015 1124   LDLCALC 86 12/09/2015 1124      Wt Readings from Last 3 Encounters:  10/18/19 250 lb (113.4 kg)  06/15/19 255 lb 12.8 oz (116 kg)  06/04/19 258 lb (117 kg)      Other studies Reviewed: Additional studies/ records that were reviewed today include: Labs. Review of the above records demonstrates:  Please see elsewhere in the note.     ASSESSMENT AND PLAN:  Leg edema:      This is improved.  He will continue with as needed Lasix.    Chronic systolic heart failure:    I am going to switch him to [facility] 49/52 twice daily.  I will stop his Cozaar.   Nonischemic cardiomyopathy: As above.  Hypertension:   This is being managed in the context of treating his CHF  Obstructive sleep apnea: He uses CPAP.     CKD stage III:   He will need a basic metabolic profile in about 2 weeks.    Covid education:  : He has been vaccinated.   Current medicines are reviewed at length with the patient today.  The patient does not have concerns regarding medicines.  The following changes have been made: As above  Labs/ tests ordered today include:  No orders of the defined types were placed in this encounter.    Disposition:   FU with me in six months.     Signed, [facility], MD  10/18/2019 3:56 PM    [facility]

## 2019-10-18 ENCOUNTER — Encounter: Payer: Self-pay | Admitting: Cardiology

## 2019-10-18 ENCOUNTER — Other Ambulatory Visit: Payer: Self-pay

## 2019-10-18 ENCOUNTER — Ambulatory Visit: Payer: Medicare Other | Admitting: Cardiology

## 2019-10-18 VITALS — BP 125/74 | HR 73 | Temp 97.2°F | Ht 73.0 in | Wt 250.0 lb

## 2019-10-18 DIAGNOSIS — I10 Essential (primary) hypertension: Secondary | ICD-10-CM | POA: Diagnosis not present

## 2019-10-18 DIAGNOSIS — M7989 Other specified soft tissue disorders: Secondary | ICD-10-CM

## 2019-10-18 DIAGNOSIS — I428 Other cardiomyopathies: Secondary | ICD-10-CM | POA: Diagnosis not present

## 2019-10-18 DIAGNOSIS — Z7189 Other specified counseling: Secondary | ICD-10-CM

## 2019-10-18 DIAGNOSIS — N1831 Chronic kidney disease, stage 3a: Secondary | ICD-10-CM | POA: Diagnosis not present

## 2019-10-18 DIAGNOSIS — I5022 Chronic systolic (congestive) heart failure: Secondary | ICD-10-CM

## 2019-10-18 MED ORDER — ENTRESTO 49-51 MG PO TABS: 1.0000 | ORAL_TABLET | Freq: Two times a day (BID) | ORAL | 6 refills | Status: DC

## 2019-10-18 NOTE — Patient Instructions (Signed)
Medication Instructions:  Stop Cozaar ( Losartan )  Start Entresto 45/51 mg one tablet twice a day Continue all other medications *If you need a refill on your cardiac medications before your next appointment, please call your pharmacy*   Lab Work: None ordered   Testing/Procedures: None ordered   Follow-Up: At [facility], you and your health needs are our priority.  As part of our continuing mission to provide you with exceptional heart care, we have created designated Provider Care Teams.  These Care Teams include your primary Cardiologist (physician) and Advanced Practice Providers (APPs -  Physician Assistants and Nurse Practitioners) who all work together to provide you with the care you need, when you need it.  We recommend signing up for the patient portal called "MyChart".  Sign up information is provided on this After Visit Summary.  MyChart is used to connect with patients for Virtual Visits (Telemedicine).  Patients are able to view lab/test results, encounter notes, upcoming appointments, etc.  Non-urgent messages can be sent to your provider as well.   To learn more about what you can do with MyChart, go to [email or web address].    Your next appointment:  6 months   Call in Dec to schedule Feb appointment    The format for your next appointment: Office     Provider:  Dr.Hochrein

## 2019-10-19 ENCOUNTER — Other Ambulatory Visit: Payer: Self-pay

## 2019-10-19 DIAGNOSIS — N1831 Chronic kidney disease, stage 3a: Secondary | ICD-10-CM

## 2019-10-19 DIAGNOSIS — I5022 Chronic systolic (congestive) heart failure: Secondary | ICD-10-CM

## 2019-10-31 DIAGNOSIS — N1832 Chronic kidney disease, stage 3b: Secondary | ICD-10-CM | POA: Diagnosis not present

## 2019-10-31 DIAGNOSIS — M0579 Rheumatoid arthritis with rheumatoid factor of multiple sites without organ or systems involvement: Secondary | ICD-10-CM | POA: Diagnosis not present

## 2019-10-31 DIAGNOSIS — R21 Rash and other nonspecific skin eruption: Secondary | ICD-10-CM | POA: Diagnosis not present

## 2019-11-02 ENCOUNTER — Other Ambulatory Visit: Payer: Self-pay | Admitting: Family

## 2019-11-02 ENCOUNTER — Telehealth: Payer: Self-pay | Admitting: Family

## 2019-11-02 DIAGNOSIS — M0579 Rheumatoid arthritis with rheumatoid factor of multiple sites without organ or systems involvement: Secondary | ICD-10-CM | POA: Diagnosis not present

## 2019-11-02 DIAGNOSIS — N1831 Chronic kidney disease, stage 3a: Secondary | ICD-10-CM | POA: Diagnosis not present

## 2019-11-02 DIAGNOSIS — I5022 Chronic systolic (congestive) heart failure: Secondary | ICD-10-CM | POA: Diagnosis not present

## 2019-11-02 NOTE — Telephone Encounter (Signed)
Pt dropped off document to be filled out by provider (Disability Parking Placard form- 2 pages) Pt would like to be called when document ready to pick up Pt tel [phone]. Document put at front office tray under providers name.

## 2019-11-03 LAB — BASIC METABOLIC PANEL
BUN/Creatinine Ratio: 13 (ref 10–24)
BUN: 22 mg/dL (ref 8–27)
CO2: 23 mmol/L (ref 20–29)
Calcium: 9.3 mg/dL (ref 8.6–10.2)
Chloride: 106 mmol/L (ref 96–106)
Creatinine, Ser: 1.69 mg/dL — ABNORMAL HIGH (ref 0.76–1.27)
GFR calc Af Amer: 44 mL/min/{1.73_m2} — ABNORMAL LOW (ref >59)
GFR calc non Af Amer: 38 mL/min/{1.73_m2} — ABNORMAL LOW (ref >59)
Glucose: 78 mg/dL (ref 65–99)
Potassium: 5 mmol/L (ref 3.5–5.2)
Sodium: 140 mmol/L (ref 134–144)

## 2019-11-05 NOTE — Telephone Encounter (Signed)
Form placed on provider's folder 

## 2019-11-09 ENCOUNTER — Telehealth: Payer: Self-pay | Admitting: Cardiology

## 2019-11-09 NOTE — Telephone Encounter (Signed)
Patient is returning call to discuss results from lab work completed on 11/02/19.

## 2019-11-09 NOTE — Telephone Encounter (Signed)
Patient called in regarding his lab results from 8/20  The patient was notified of the following  [name], MD  11/04/2019 12:02 PM EDT     Creat is elevated but stable. No change in therapy. Call Mr. [place] with the results and send results to [name], NP   Patient verbalized understanding

## 2019-11-12 ENCOUNTER — Other Ambulatory Visit: Payer: Self-pay | Admitting: Family

## 2019-11-16 ENCOUNTER — Ambulatory Visit: Payer: Medicare Other | Admitting: Family

## 2019-11-23 ENCOUNTER — Encounter: Payer: Self-pay | Admitting: Family

## 2019-11-23 ENCOUNTER — Ambulatory Visit (INDEPENDENT_AMBULATORY_CARE_PROVIDER_SITE_OTHER): Payer: Medicare Other | Admitting: Family

## 2019-11-23 ENCOUNTER — Other Ambulatory Visit: Payer: Self-pay

## 2019-11-23 VITALS — BP 124/76 | HR 91 | Resp 17 | Ht 73.0 in | Wt 246.0 lb

## 2019-11-23 DIAGNOSIS — M069 Rheumatoid arthritis, unspecified: Secondary | ICD-10-CM | POA: Diagnosis not present

## 2019-11-23 DIAGNOSIS — I10 Essential (primary) hypertension: Secondary | ICD-10-CM

## 2019-11-23 DIAGNOSIS — N400 Enlarged prostate without lower urinary tract symptoms: Secondary | ICD-10-CM | POA: Diagnosis not present

## 2019-11-23 DIAGNOSIS — J45909 Unspecified asthma, uncomplicated: Secondary | ICD-10-CM | POA: Diagnosis not present

## 2019-11-23 MED ORDER — MONTELUKAST SODIUM 10 MG PO TABS: 10.0000 mg | ORAL_TABLET | Freq: Every day | ORAL | 1 refills | Status: DC

## 2019-11-23 MED ORDER — PROAIR HFA 108 (90 BASE) MCG/ACT IN AERS: INHALATION_SPRAY | RESPIRATORY_TRACT | 5 refills | Status: DC

## 2019-11-23 MED ORDER — SPIRONOLACTONE 25 MG PO TABS: 25.0000 mg | ORAL_TABLET | Freq: Every day | ORAL | 1 refills | Status: DC

## 2019-11-23 MED ORDER — FLUTICASONE-SALMETEROL 500-50 MCG/DOSE IN AEPB: 1.0000 | INHALATION_SPRAY | Freq: Two times a day (BID) | RESPIRATORY_TRACT | 5 refills | Status: DC

## 2019-11-23 MED ORDER — METOPROLOL SUCCINATE ER 25 MG PO TB24: 25.0000 mg | ORAL_TABLET | Freq: Every day | ORAL | 1 refills | Status: DC

## 2019-11-23 MED ORDER — AMLODIPINE BESYLATE 10 MG PO TABS: 10.0000 mg | ORAL_TABLET | Freq: Every day | ORAL | 1 refills | Status: DC

## 2019-11-23 NOTE — Progress Notes (Signed)
Subjective:    Patient ID: Brandon [name], male    DOB: 06/20/1940, 79 y.o.   MRN: [id number]  HPI  Patient is a 79 yr old male who presents today for follow up.  RA- The patient is following with rheumatology and he is currently being treated with  Arava. States his overall joint pain and swelling is much improved.  Asthma- uses albuterol once a day.  He continues advair twice daily. Feels that overall symptoms are stable.   BPH- reports that he uses flomax most of the time. Denies any current voiding issues.  Lab Results  Component Value Date   PSA 3.40 01/30/2019   PSA 1.34 08/13/2009   HTN- maintained on aldactone, metoprolol and amlodipine.  BP Readings from Last 3 Encounters:  11/23/19 124/76  10/18/19 125/74  06/15/19 128/84        Review of Systems See HPI Past Medical History:  Diagnosis Date  . Anemia   . Asthma    Hx of childhood asthma, disappeared for a while, then resurfaced 6-7 years ago.   . Cardiomyopathy    with a negative cardiac catheterization in the past. (EF appriximately 40-45%)   . Heme positive stool 02/01/2019  . HTN (hypertension)    x 30 years  . Obesity, unspecified   . Pneumonia   . Sleep apnea    CPAP  . 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 Marital status: Married    Spouse name: [name]  . Number of children: 3  . Years of education: Not on file  . Highest education level: Not on file  Occupational History  . Occupation: retired  Tobacco Use  . Smoking status: Former Games developer  . Smokeless tobacco: Never Used  . Tobacco comment: quit smoling in 07-11-1988. started when 18, 1 ppd  Vaping Use  . Vaping Use: Never used  Substance and Sexual Activity  . Alcohol use: Yes    Comment: occasionaly   . Drug use: Never  . Sexual activity: Not on file  Other Topics Concern  . Not on file  Social History Narrative   Grew up in [place], attended [employer]. First  wife died of breast ca in 1997-07-11. 3 children. Remarried- 8 years. 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    Pt signed designated party release granting access to [facility] to his wife [name]. Detailed message may be left on home or cell phone. Roselle Locus August 13, 2009 11:34 am.    Social Determinants of Health   Financial Resource Strain:   . Difficulty of Paying Living Expenses: Not on file  Food Insecurity:   . Worried About Programme researcher, broadcasting/film/video in the Last Year: Not on file  . Ran Out of Food in the Last Year: Not on file  Transportation Needs:   . Lack of Transportation (Medical): Not on file  . Lack of Transportation (Non-Medical): Not on file  Physical Activity:   . Days of Exercise per Week: Not on file  . Minutes of Exercise per Session: Not on file  Stress:   . Feeling of Stress : Not on file  Social Connections:   . Frequency of Communication with Friends and Family: Not on file  . Frequency of Social Gatherings with Friends and Family: Not on file  . Attends Religious Services: Not on file  . Active Member of Clubs or Organizations: Not  on file  . Attends Banker Meetings: Not on file  . Marital Status: Not on file  Intimate Partner Violence:   . Fear of Current or Ex-Partner: Not on file  . Emotionally Abused: Not on file  . Physically Abused: Not on file  . Sexually Abused: Not on file    Past Surgical History:  Procedure Laterality Date  . None      Family History  Problem Relation Age of Onset  . Cancer Father        multiple melanoma  . Lupus Sister        died at 52  . Diabetes Mellitus II Sister        died from covid-19  . Asthma Daughter   . Neuropathy Sister   . Colon cancer Neg Hx   . Esophageal cancer Neg Hx     Allergies  Allergen Reactions  . Ace Inhibitors Cough  . Isosorb Dinitrate-Hydralazine Other (See Comments)    REACTION: dizziness/hypotensive    Current Outpatient Medications on File  Prior to Visit  Medication Sig Dispense Refill  . amLODipine (NORVASC) 10 MG tablet Take 1 tablet by mouth once daily 90 tablet 0  . aspirin 81 MG tablet Take 81 mg by mouth daily.      Marland Kitchen b complex vitamins tablet Take 1 tablet by mouth daily.    . clindamycin (CLEOCIN) 300 MG capsule Take 1 capsule (300 mg total) by mouth 3 (three) times daily. 21 capsule 0  . Fluticasone-Salmeterol (ADVAIR) 500-50 MCG/DOSE AEPB Inhale 1 puff into the lungs in the morning and at bedtime. 60 each 1  . furosemide (LASIX) 40 MG tablet Take 1 tablet (40 mg total) by mouth daily. 90 tablet 1  . leflunomide (ARAVA) 20 MG tablet Take 20 mg by mouth daily.    . metoprolol succinate (TOPROL-XL) 25 MG 24 hr tablet Take 1 tablet by mouth once daily 90 tablet 0  . montelukast (SINGULAIR) 10 MG tablet TAKE 1 TABLET BY MOUTH AT BEDTIME 90 tablet 0  . PROAIR HFA 108 (90 Base) MCG/ACT inhaler INHALE 2 PUFFS BY MOUTH EVERY 6 HOURS AS NEEDED FOR WHEEZING AND SHORTNESS OF BREATH 9 g 0  . sacubitril-valsartan (ENTRESTO) 49-51 MG Take 1 tablet by mouth 2 (two) times daily. 60 tablet 6  . spironolactone (ALDACTONE) 25 MG tablet Take 1 tablet by mouth once daily 90 tablet 0  . tamsulosin (FLOMAX) 0.4 MG CAPS capsule Take 1 capsule by mouth once daily 30 capsule 5  . [DISCONTINUED] Fluticasone-Salmeterol (ADVAIR) 500-50 MCG/DOSE AEPB INHALE 1 DOSE BY MOUTH TWICE DAILY 60 each 5   No current facility-administered medications on file prior to visit.    BP 124/76 (BP Location: Left Arm, Patient Position: Sitting, Cuff Size: Large)   Pulse 91   Resp 17   Ht 6\' 1"  (1.854 m)   Wt 246 lb (111.6 kg)   SpO2 97%   BMI 32.46 kg/m       Objective:   Physical Exam Constitutional:      General: He is not in acute distress.    Appearance: He is well-developed.  HENT:     Head: Normocephalic and atraumatic.  Cardiovascular:     Rate and Rhythm: Normal rate and regular rhythm.     Heart sounds: No murmur heard.   Pulmonary:      Effort: Pulmonary effort is normal. No respiratory distress.     Breath sounds: Normal breath sounds. No wheezing or rales.  Skin:    General: Skin is warm and dry.  Neurological:     Mental Status: He is alert and oriented to person, place, and time.  Psychiatric:        Behavior: Behavior normal.        Thought Content: Thought content normal.           Assessment & Plan:  HTN- bp stable on currrent medications. Continue same.  Rheumatoid arthritis- stable/improved on arava.  Management per rheumatology.  Asthma- stable on current regimen.   BPH-  Stable on flomax. Check follow up PSA.  I recommended that he seek a covid booster shot due to his rx Arava.   This visit occurred during the SARS-CoV-2 public health emergency.  Safety protocols were in place, including screening questions prior to the visit, additional usage of staff PPE, and extensive cleaning of exam room while observing appropriate contact time as indicated for disinfecting solutions.

## 2019-11-24 LAB — PSA: PSA: 1.7 ng/mL (ref <=4.0)

## 2019-11-25 ENCOUNTER — Encounter: Payer: Self-pay | Admitting: Family

## 2019-11-28 NOTE — Progress Notes (Signed)
Mailed out to pt 

## 2019-11-29 ENCOUNTER — Telehealth: Payer: Self-pay | Admitting: Family

## 2019-11-29 NOTE — Progress Notes (Signed)
  Chronic Care Management   Note  11/29/2019 Name: Giovonnie E Swaziland MRN: [id number] DOB: October 16, 1940  Socorro E Swaziland is a 79 y.o. year old male who is a primary care patient of [name], NP. I reached out to Jayquan E Swaziland by phone today in response to a referral sent by Mr. Malka So PCP, [name], NP.   Mr. Swaziland was given information about Chronic Care Management services today including:  1. CCM service includes personalized support from designated clinical staff supervised by his physician, including individualized plan of care and coordination with other care providers 2. 24/7 contact phone numbers for assistance for urgent and routine care needs. 3. Service will only be billed when office clinical staff spend 20 minutes or more in a month to coordinate care. 4. Only one practitioner may furnish and bill the service in a calendar month. 5. The patient may stop CCM services at any time (effective at the end of the month) by phone call to the office staff.   Patient agreed to services and verbal consent obtained.   Follow up plan:   Carley Perdue [employer] Scheduler

## 2019-12-25 ENCOUNTER — Telehealth: Payer: Self-pay | Admitting: Cardiology

## 2019-12-25 NOTE — Telephone Encounter (Signed)
Left message for pt to c/b to discuss changing back to Losartan.  Appears he was changed from Losartan to [facility] in August this year.

## 2019-12-25 NOTE — Telephone Encounter (Signed)
Spoke with patient who reports he is not able to continue to afford Entresto 49-51 mg and needs to be changed back to Losartan.  Advised I will notify Dr Antoine Poche.  Pt would like new RX sent into [facility] [address], [place].  Pt is aware I will notify him once this has occurred.

## 2019-12-25 NOTE — Telephone Encounter (Signed)
New message:     Patient calling to see if he can change back till lorsartin. Please call patient

## 2019-12-26 NOTE — Telephone Encounter (Signed)
Pt given #14 day supply of Entresto 49/51 to last him until he hears back from Dr. Antoine Poche re: changing his meds.  Exp 07/2021 Let [id number]

## 2019-12-26 NOTE — Telephone Encounter (Signed)
° ° °  Pt is calling back to follow. He said he cant wait any longer and need to take something, he wanted to ask if there's entresto samples available while waiting for Dr. Antoine Poche

## 2019-12-27 MED ORDER — LOSARTAN POTASSIUM 50 MG PO TABS: 50.0000 mg | ORAL_TABLET | Freq: Every day | ORAL | 3 refills | Status: DC

## 2019-12-27 NOTE — Telephone Encounter (Signed)
Stop Entresto and start Cozaar 50 mg po daily.

## 2019-12-27 NOTE — Telephone Encounter (Signed)
Left message for pt rx for Losartan 50 mg daily has been sent into [facility] as requested.  He is aware to switch over to Losartan once he is out of Entresto.  Requested he call back if any questions or concerns.

## 2020-01-03 ENCOUNTER — Other Ambulatory Visit: Payer: Self-pay | Admitting: Family

## 2020-01-22 ENCOUNTER — Other Ambulatory Visit: Payer: Self-pay

## 2020-01-22 DIAGNOSIS — I10 Essential (primary) hypertension: Secondary | ICD-10-CM

## 2020-01-22 DIAGNOSIS — M069 Rheumatoid arthritis, unspecified: Secondary | ICD-10-CM

## 2020-01-24 ENCOUNTER — Other Ambulatory Visit: Payer: Self-pay

## 2020-01-24 ENCOUNTER — Ambulatory Visit: Payer: Medicare Other

## 2020-01-24 VITALS — BP 146/88 | HR 67 | Temp 98.4°F | Wt 245.4 lb

## 2020-01-24 DIAGNOSIS — I10 Essential (primary) hypertension: Secondary | ICD-10-CM

## 2020-01-24 DIAGNOSIS — R739 Hyperglycemia, unspecified: Secondary | ICD-10-CM

## 2020-01-24 DIAGNOSIS — I5022 Chronic systolic (congestive) heart failure: Secondary | ICD-10-CM

## 2020-01-24 NOTE — Chronic Care Management (AMB) (Signed)
Chronic Care Management Pharmacy  Name: Brandon [name]  MRN: [id number] DOB: 01/27/1941  Chief Complaint/ HPI  Brandon [name],  79 y.o. , male presents for their Initial CCM visit with the clinical pharmacist In office.  PCP : [name], NP  Their chronic conditions include: Hypertension, Pre-Diabetes, Heart Failure, Asthma, BPH, Rheumatoid Arthritis   Office Visits: 11/23/19: Visit w/ [name], NP - No med changes noted  Consult Visit: 12/25/19: Cardio message to stop entresto and start losartan 50mg  daily  10/18/19: Cardio visit w/ Dr. Antoine Poche - Losartan to entresto.  Medications: Outpatient Encounter Medications as of 01/24/2020  Medication Sig  . Fluticasone-Salmeterol (ADVAIR) 500-50 MCG/DOSE AEPB Inhale 1 puff into the lungs in the morning and at bedtime.  Marland Kitchen amLODipine (NORVASC) 10 MG tablet Take 1 tablet (10 mg total) by mouth daily.  Marland Kitchen aspirin 81 MG tablet Take 81 mg by mouth daily.    Marland Kitchen b complex vitamins tablet Take 1 tablet by mouth daily.  . clindamycin (CLEOCIN) 300 MG capsule Take 1 capsule (300 mg total) by mouth 3 (three) times daily.  . furosemide (LASIX) 40 MG tablet Take 1 tablet (40 mg total) by mouth daily.  Marland Kitchen leflunomide (ARAVA) 20 MG tablet Take 20 mg by mouth daily.  Marland Kitchen losartan (COZAAR) 50 MG tablet Take 1 tablet (50 mg total) by mouth daily.  . metoprolol succinate (TOPROL-XL) 25 MG 24 hr tablet Take 1 tablet (25 mg total) by mouth daily.  . montelukast (SINGULAIR) 10 MG tablet Take 1 tablet (10 mg total) by mouth at bedtime.  Marland Kitchen PROAIR HFA 108 (90 Base) MCG/ACT inhaler INHALE 2 PUFFS BY MOUTH EVERY 6 HOURS AS NEEDED FOR WHEEZING AND SHORTNESS OF BREATH  . spironolactone (ALDACTONE) 25 MG tablet Take 1 tablet by mouth once daily  . tamsulosin (FLOMAX) 0.4 MG CAPS capsule Take 1 capsule by mouth once daily  . [DISCONTINUED] Fluticasone-Salmeterol (ADVAIR) 500-50 MCG/DOSE AEPB INHALE 1 DOSE BY MOUTH TWICE DAILY   No  facility-administered encounter medications on file as of 01/24/2020.     Current Diagnosis/Assessment:  Goals Addressed   None    Social Hx:  Lived in [place] for many years.  Retired from Photographer; worked for the city of [place]; also worked for [employer].  His sister and her son passed away due to covid His grand daughter lives with him. She is in her first year at [employer]. She also works.  He has 4 kids (3 living, 1 accidentally killed at age 49); wife has 4 kids, he has 11 grandkids, 2 great grandkids. No pets   Typical Nettye Flegal Doesn't sleep until 3-4am Wakes around 11am-12pm Watches TV, sits on patio, listens to music. Arthritis limits his ability to do yard work.    Hypertension   BP goal is:  <140/90  Office blood pressures are  BP Readings from Last 3 Encounters:  01/24/20 (!) 146/88  11/23/19 124/76  10/18/19 125/74   Patient checks BP at home infrequently Patient home BP readings are ranging: Unable to assess  Patient has failed these meds in the past: None noted  Patient is currently controlled on the following medications:  . Amlodipine 10mg  daily  . Metoprolol Succinate 25mg  daily . Losartan 50mg  daily (recently switched from [place] due to cost) . Spironolactone 25mg  daily  We discussed BP goal  Plan -Continue current medications    Pre-Diabetes   A1c goal <6.5%  Recent Relevant Labs: Lab Results  Component Value Date/Time  HGBA1C 6.3 11/07/2018 11:59 AM   HGBA1C 6.0 12/20/2017 01:29 PM   GFR 68.85 01/12/2019 02:11 PM   GFR 60.83 11/07/2018 11:59 AM    Patient has failed these meds in past: None noted  Patient is currently controlled on the following medications: . None  We discussed: diet and exercise extensively and A1c goal  Plan -Continue current medications  Heart Failure   Type: Systolic  Last ejection fraction: 01/02/2019 45-50% NYHA Class: II (slight limitation of activity) AHA HF Stage: C (Heart  disease and symptoms present)  Patient has failed these meds in past: None noted  Patient is currently controlled on the following medications:  Furosemide 40mg  daily   Metoprolol Succinate 25mg  daily  Spironolactone 25mg  daily  Losartan 50mg  gdaily  Edema?  Minimal SOB? No Weigh Daily? No   We discussed the superiority of Entresto to Losartan and possibility of patient assistance to help patient afford this therapy. Will coordinate with cardio to see if this is an agreeable plan. Pt can continue losartan until PAP is approved and then specific guidance can be given regarding medication dosing.    Plan -Continue current medications   Asthma / Tobacco   Last spirometry score: None noted  Eosinophil count:   Lab Results  Component Value Date/Time   EOSPCT 3 04/24/2019 01:43 PM  %                               Eos (Absolute):  Lab Results  Component Value Date/Time   EOSABS 0.3 04/24/2019 01:43 PM    Tobacco Status:  Social History   Tobacco Use  Smoking Status Former Smoker  Smokeless Tobacco Never Used  Tobacco Comment   quit smoling in 1990. started when 18, 1 ppd    Patient has failed these meds in past: None noted  Patient is currently controlled on the following medications:   Advair 500/16mcg 1 puff twice daily  Montelukast 10mg  daily   ProAir 2 puffs every 6 hours as needed Using maintenance inhaler regularly? Yes Frequency of rescue inhaler use:  1-2x per week (especially after walking up steps)   Cost of Advair is troublesome Completing PAP for Advair   Plan -Continue current medications  -Complete PAP for Advair  BPH   PSA  Date Value Ref Range Status  11/23/2019 1.7 < OR = 4.0 ng/mL Final    Comment:    The total PSA value from this assay system is  standardized against the WHO standard. The test  result will be approximately 20% lower when compared  to the equimolar-standardized total PSA (Beckman  Coulter). Comparison of serial  PSA results should be  interpreted with this fact in mind. . This test was performed using the Siemens  chemiluminescent method. Values obtained from  different assay methods cannot be used interchangeably. PSA levels, regardless of value, should not be interpreted as absolute evidence of the presence or absence of disease.   01/30/2019 3.40 0.10 - 4.00 ng/mL Final    Comment:    Test performed using Access Hybritech PSA Assay, a parmagnetic partical, chemiluminecent immunoassay.  08/13/2009 1.34 0.10 - 4.00 ng/mL Final    Comment:    See lab report for associated comment(s)     Patient has failed these meds in past: None noted  Patient is currently controlled on the following medications:  . Tamsulosin 0.4mg  daily  Overnight urination? No Stream? Yes Bladder Emptying? Sometimes yes,  sometimes no  Plan -Continue current medications   Rheumatoid Arthritis     Patient has failed these meds in past: None noted  Patient is currently controlled on the following medications: . Leflunomide 20mg  daily   Plan -Continue current medications   Vaccines   Reviewed and discussed patient's vaccination history.    Immunization History  Administered Date(s) Administered  . Fluad Quad(high Dose 65+) 11/21/2018  . Influenza Split 02/24/2011, 12/03/2011  . Influenza Whole 03/16/2007, 12/29/2009  . Influenza, High Dose Seasonal PF 02/26/2015, 12/09/2015, 04/22/2017, 12/14/2017  . Influenza,inj,Quad PF,6+ Mos 02/13/2013, 11/14/2013  . Moderna SARS-COVID-2 Vaccination 04/08/2019, 05/06/2019  . Pneumococcal Conjugate-13 11/14/2013  . Pneumococcal Polysaccharide-23 08/13/2009  . Td 07/26/2006  . Tdap 09/22/2012, 05/23/2017  . Zoster 05/17/2011   Discussed Shingrix vaccine Received flu vaccine about 3-4 weeks ago ago Received moderna booster at 07/17/2011 about 6 weeks   Plan -Recommended patient receive Shingrix vaccine in pharmacy.   Medication Management   [facility] for all medications Uses pill box? Yes  Miscellaneous Meds Aspirin 81mg  daily  B complex  Meds to D/C from list Clindamycin   Plan -Continue current medication management strategy    Follow up: 3 month phone visit  [employer], PharmD Clinical Pharmacist [facility] [phone]

## 2020-01-31 DIAGNOSIS — R21 Rash and other nonspecific skin eruption: Secondary | ICD-10-CM | POA: Diagnosis not present

## 2020-01-31 DIAGNOSIS — N1832 Chronic kidney disease, stage 3b: Secondary | ICD-10-CM | POA: Diagnosis not present

## 2020-01-31 DIAGNOSIS — M5136 Other intervertebral disc degeneration, lumbar region: Secondary | ICD-10-CM | POA: Diagnosis not present

## 2020-01-31 DIAGNOSIS — M0579 Rheumatoid arthritis with rheumatoid factor of multiple sites without organ or systems involvement: Secondary | ICD-10-CM | POA: Diagnosis not present

## 2020-02-06 ENCOUNTER — Telehealth: Payer: Self-pay | Admitting: Pharmacist

## 2020-02-06 NOTE — Progress Notes (Addendum)
    Chronic Care Management Pharmacy Assistant   Name: Brandon [name]  MRN: [id number] DOB: 07-Feb-1941  Reason for Encounter: Medication Review  Patient Questions:  1.  Have you seen any other providers since your last visit? No  2.  Any changes in your medicines or health? No  PCP : [name], NP  Allergies:   Allergies  Allergen Reactions   Ace Inhibitors Cough   Isosorb Dinitrate-Hydralazine Other (See Comments)    REACTION: dizziness/hypotensive    Medications: Outpatient Encounter Medications as of 02/06/2020  Medication Sig   amLODipine (NORVASC) 10 MG tablet Take 1 tablet (10 mg total) by mouth daily.   aspirin 81 MG tablet Take 81 mg by mouth daily.     b complex vitamins tablet Take 1 tablet by mouth daily.   clindamycin (CLEOCIN) 300 MG capsule Take 1 capsule (300 mg total) by mouth 3 (three) times daily.   Fluticasone-Salmeterol (ADVAIR) 500-50 MCG/DOSE AEPB Inhale 1 puff into the lungs in the morning and at bedtime.   furosemide (LASIX) 40 MG tablet Take 1 tablet (40 mg total) by mouth daily.   leflunomide (ARAVA) 20 MG tablet Take 20 mg by mouth daily.   losartan (COZAAR) 50 MG tablet Take 1 tablet (50 mg total) by mouth daily.   metoprolol succinate (TOPROL-XL) 25 MG 24 hr tablet Take 1 tablet (25 mg total) by mouth daily.   montelukast (SINGULAIR) 10 MG tablet Take 1 tablet (10 mg total) by mouth at bedtime.   PROAIR HFA 108 (90 Base) MCG/ACT inhaler INHALE 2 PUFFS BY MOUTH EVERY 6 HOURS AS NEEDED FOR WHEEZING AND SHORTNESS OF BREATH   spironolactone (ALDACTONE) 25 MG tablet Take 1 tablet by mouth once daily   tamsulosin (FLOMAX) 0.4 MG CAPS capsule Take 1 capsule by mouth once daily   [DISCONTINUED] Fluticasone-Salmeterol (ADVAIR) 500-50 MCG/DOSE AEPB INHALE 1 DOSE BY MOUTH TWICE DAILY   No facility-administered encounter medications on file as of 02/06/2020.    Current Diagnosis: Patient Active Problem List   Diagnosis Date Noted   Educated  about COVID-19 virus infection 10/17/2019   Stage 3a chronic kidney disease (HCC) 10/17/2019   Leg swelling 10/17/2019   Heme positive stool 02/01/2019   RBBB 10/03/2018   Bilateral shoulder pain 08/24/2016   Depression 12/09/2015   Pain and swelling of knee 06/14/2014   Edema 12/06/2011   Asthma 08/13/2009   Hyperglycemia 08/13/2009   Obstructive sleep apnea 01/23/2009   CHRONIC SYSTOLIC HEART FAILURE 09/11/2008   OBESITY, UNSPECIFIED 08/26/2008   Disorder resulting from impaired renal function 08/26/2008   Essential hypertension 06/28/2008   Nonischemic cardiomyopathy (HCC) 06/28/2008    Goals Addressed   None    Contacted PCP nurse to check on patient assistance forms for Advair.   12-25-19 Patient was advised by cardiologist to switch to losartan once patient is out of Entresto.    Spoke with patient for confirmation on changing to losartan.  Patient reported he rather be on Entresto and he finally has all documents to turn for the assistance application.  Reports he will go by PCP office on Monday 02/11/20.     Follow-Up:  Patient Assistance Coordination and Pharmacist Review   [place] Shannon, [place] [employer] [facility] at [facility] Clinical Pharmacist Assistant [phone]  Reviewed by: Katrinka Blazing, PharmD Clinical Pharmacist  Primary Care at [facility] [phone]

## 2020-02-06 NOTE — Patient Instructions (Signed)
Visit Information  Goals Addressed            This Visit's Progress    Chronic Care Management Pharmacy Care Plan       CARE PLAN ENTRY (see longitudinal plan of care for additional care plan information)  Current Barriers:   Chronic Disease Management support, education, and care coordination needs related to Hypertension, Pre-Diabetes, Heart Failure, Asthma, BPH, Rheumatoid Arthritis    Hypertension BP Readings from Last 3 Encounters:  01/24/20 (!) 146/88  11/23/19 124/76  10/18/19 125/74    Pharmacist Clinical Goal(s): o Over the next 90 days, patient will work with PharmD and providers to maintain BP goal <140/90  Current regimen:   Amlodipine 2m daily   Metoprolol Succinate 263mdaily  Losartan 5084maily   Spironolactone 4m37mily  Interventions: o Discussed BP goal  Patient self care activities - Over the next 90 days, patient will: o Maintain hypertension medication regimen.   Pre-Diabetes Lab Results  Component Value Date/Time   HGBA1C 6.3 11/07/2018 11:59 AM   HGBA1C 6.0 12/20/2017 01:29 PM    Pharmacist Clinical Goal(s): o Over the next 90 days, patient will work with PharmD and providers to maintain A1c goal <6.5%  Current regimen:  o Diet and exercise management    Interventions: o Discussed diet  Patient self care activities - Over the next 90 days, patient will: o Maintain a1c less than 6.5%  Asthma  Pharmacist Clinical Goal(s) o Over the next 90 days, patient will work with PharmD and providers to reduce symptoms associated with Asthma and reduce barriers to medication  Current regimen:   Advair 500/50mc47mpuff twice daily  Montelukast 10mg 8my   ProAir 2 puffs every 6 hours as needed  Interventions: o Complete patient assistance for Advair  Patient self care activities - Over the next 90 days, patient will: o Complete paperwork for Advair patient assistance  Heart Failure  Pharmacist Clinical Goal(s) o Over the  next 90 days, patient will work with PharmD and providers to reduce symptoms associated with heart failure  Current regimen:   Furosemide 40mg d83m   Metoprolol Succinate 4mg da69m Spironolactone 4mg dai60mLosartan 50mg gdai51mInterventions: o Collaboration with provider regarding medication management (Consider completing PAP for Entresto)  Patient self care activities - Over the next 90 days, patient will: o  Maintain heart failure medication regimen  Health Maintenance   Pharmacist Clinical Goal(s) o Over the next 90 days, patient will work with PharmD and providers to complete health maintenance screenings/vaccinations  Interventions: o Discussed Shingrix vaccine series  Patient self care activities - Over the next 90 days, patient will: o Complete Shingrix vaccine series   Medication management  Pharmacist Clinical Goal(s): o Over the next 90 days, patient will work with PharmD and providers to maintain optimal medication adherence  Current pharmacy: [facility]  Interventions o Comprehensive medication review performed. o Continue current medication management strategy  Patient self care activities - Over the next 90 days, patient will: o Focus on medication adherence by filling and taking medications appropriately  o Take medications as prescribed o Report any questions or concerns to PharmD and/or provider(s)  Initial goal documentation        Mr. Bow[place]aster [place] information about Chronic Care Management services today including:  1. CCM service includes personalized support from designated clinical staff supervised by his physician, including individualized plan of care and coordination with other care providers 2. 24/7 contact phone numbers for  assistance for urgent and routine care needs. 3. Standard insurance, coinsurance, copays and deductibles apply for chronic care management only during months in which we provide at least 20 minutes of these  services. Most insurances cover these services at 100%, however patients may be responsible for any copay, coinsurance and/or deductible if applicable. This service may help you avoid the need for more expensive face-to-face services. 4. Only one practitioner may furnish and bill the service in a calendar month. 5. The patient may stop CCM services at any time (effective at the end of the month) by phone call to the office staff.  Patient agreed to services and verbal consent obtained.   The patient verbalized understanding of instructions, educational materials, and care plan provided today and agreed to receive a mailed copy of patient instructions, educational materials, and care plan.  Telephone follow up appointment with pharmacy team member scheduled for: 04/24/2020  Melvenia Beam Sharri Loya, PharmD Clinical Pharmacist [facility] [phone]   Healthy Eating Following a healthy eating pattern may help you to achieve and maintain a healthy body weight, reduce the risk of chronic disease, and live a long and productive life. It is important to follow a healthy eating pattern at an appropriate calorie level for your body. Your nutritional needs should be met primarily through food by choosing a variety of nutrient-rich foods. What are tips for following this plan? Reading food labels  Read labels and choose the following: ? Reduced or low sodium. ? Juices with 100% fruit juice. ? Foods with low saturated fats and high polyunsaturated and monounsaturated fats. ? Foods with whole grains, such as whole wheat, cracked wheat, brown rice, and wild rice. ? Whole grains that are fortified with folic acid. This is recommended for women who are pregnant or who want to become pregnant.  Read labels and avoid the following: ? Foods with a lot of added sugars. These include foods that contain brown sugar, corn sweetener, corn syrup, dextrose, fructose, glucose, high-fructose  corn syrup, honey, invert sugar, lactose, malt syrup, maltose, molasses, raw sugar, sucrose, trehalose, or turbinado sugar.  Do not eat more than the following amounts of added sugar per Tricha Ruggirello:  6 teaspoons (25 g) for women.  9 teaspoons (38 g) for men. ? Foods that contain processed or refined starches and grains. ? Refined grain products, such as white flour, degermed cornmeal, white bread, and white rice. Shopping  Choose nutrient-rich snacks, such as vegetables, whole fruits, and nuts. Avoid high-calorie and high-sugar snacks, such as potato chips, fruit snacks, and candy.  Use oil-based dressings and spreads on foods instead of solid fats such as butter, stick margarine, or cream cheese.  Limit pre-made sauces, mixes, and "instant" products such as flavored rice, instant noodles, and ready-made pasta.  Try more plant-protein sources, such as tofu, tempeh, black beans, edamame, lentils, nuts, and seeds.  Explore eating plans such as the Mediterranean diet or vegetarian diet. Cooking  Use oil to saut or stir-fry foods instead of solid fats such as butter, stick margarine, or lard.  Try baking, boiling, grilling, or broiling instead of frying.  Remove the fatty part of meats before cooking.  Steam vegetables in water or broth. Meal planning   At meals, imagine dividing your plate into fourths: ? One-half of your plate is fruits and vegetables. ? One-fourth of your plate is whole grains. ? One-fourth of your plate is protein, especially lean meats, poultry, eggs, tofu, beans, or nuts.  Include low-fat  dairy as part of your daily diet. Lifestyle  Choose healthy options in all settings, including home, work, school, restaurants, or stores.  Prepare your food safely: ? Wash your hands after handling raw meats. ? Keep food preparation surfaces clean by regularly washing with hot, soapy water. ? Keep raw meats separate from ready-to-eat foods, such as fruits and  vegetables. ? Cook seafood, meat, poultry, and eggs to the recommended internal temperature. ? Store foods at safe temperatures. In general:  Keep cold foods at 62F (4.4C) or below.  Keep hot foods at 162F (60C) or above.  Keep your freezer at [facility] (-17.8C) or below.  Foods are no longer safe to eat when they have been between the temperatures of 40-162F (4.4-60C) for more than 2 hours. What foods should I eat? Fruits Aim to eat 2 cup-equivalents of fresh, canned (in natural juice), or frozen fruits each Christopher Hink. Examples of 1 cup-equivalent of fruit include 1 small apple, 8 large strawberries, 1 cup canned fruit,  cup dried fruit, or 1 cup 100% juice. Vegetables Aim to eat 2-3 cup-equivalents of fresh and frozen vegetables each Nacole Fluhr, including different varieties and colors. Examples of 1 cup-equivalent of vegetables include 2 medium carrots, 2 cups raw, leafy greens, 1 cup chopped vegetable (raw or cooked), or 1 medium baked potato. Grains Aim to eat 6 ounce-equivalents of whole grains each Naveen Lorusso. Examples of 1 ounce-equivalent of grains include 1 slice of bread, 1 cup ready-to-eat cereal, 3 cups popcorn, or  cup cooked rice, pasta, or cereal. Meats and other proteins Aim to eat 5-6 ounce-equivalents of protein each Vick Filter. Examples of 1 ounce-equivalent of protein include 1 egg, 1/2 cup nuts or seeds, or 1 tablespoon (16 g) peanut butter. A cut of meat or fish that is the size of a deck of cards is about 3-4 ounce-equivalents.  Of the protein you eat each week, try to have at least 8 ounces come from seafood. This includes salmon, trout, herring, and anchovies. Dairy Aim to eat 3 cup-equivalents of fat-free or low-fat dairy each Shawneequa Baldridge. Examples of 1 cup-equivalent of dairy include 1 cup (240 mL) milk, 8 ounces (250 g) yogurt, 1 ounces (44 g) natural cheese, or 1 cup (240 mL) fortified soy milk. Fats and oils  Aim for about 5 teaspoons (21 g) per Chanel Mckesson. Choose monounsaturated fats, such as  canola and olive oils, avocados, peanut butter, and most nuts, or polyunsaturated fats, such as sunflower, corn, and soybean oils, walnuts, pine nuts, sesame seeds, sunflower seeds, and flaxseed. Beverages  Aim for six 8-oz glasses of water per Aubriauna Riner. Limit coffee to three to five 8-oz cups per Elbridge Magowan.  Limit caffeinated beverages that have added calories, such as soda and energy drinks.  Limit alcohol intake to no more than 1 drink a Fielding Mault for nonpregnant women and 2 drinks a Shuaib Corsino for men. One drink equals 12 oz of beer (355 mL), 5 oz of wine (148 mL), or 1 oz of hard liquor (44 mL). Seasoning and other foods  Avoid adding excess amounts of salt to your foods. Try flavoring foods with herbs and spices instead of salt.  Avoid adding sugar to foods.  Try using oil-based dressings, sauces, and spreads instead of solid fats. This information is based on general [place] nutrition guidelines. For more information, visit [employer].es. Exact amounts may vary based on your nutrition needs. Summary  A healthy eating plan may help you to maintain a healthy weight, reduce the risk of chronic diseases, and stay active  throughout your life.  Plan your meals. Make sure you eat the right portions of a variety of nutrient-rich foods.  Try baking, boiling, grilling, or broiling instead of frying.  Choose healthy options in all settings, including home, work, school, restaurants, or stores. This information is not intended to replace advice given to you by your health care provider. Make sure you discuss any questions you have with your health care provider. Document Revised: 06/13/2017 Document Reviewed: 06/13/2017 Elsevier Patient Education  [place].

## 2020-02-13 DIAGNOSIS — H2512 Age-related nuclear cataract, left eye: Secondary | ICD-10-CM | POA: Diagnosis not present

## 2020-02-13 DIAGNOSIS — H43813 Vitreous degeneration, bilateral: Secondary | ICD-10-CM | POA: Diagnosis not present

## 2020-02-13 DIAGNOSIS — H25012 Cortical age-related cataract, left eye: Secondary | ICD-10-CM | POA: Diagnosis not present

## 2020-02-13 DIAGNOSIS — M0689 Other specified rheumatoid arthritis, multiple sites: Secondary | ICD-10-CM | POA: Diagnosis not present

## 2020-02-13 DIAGNOSIS — Z961 Presence of intraocular lens: Secondary | ICD-10-CM | POA: Diagnosis not present

## 2020-02-22 ENCOUNTER — Other Ambulatory Visit: Payer: Self-pay | Admitting: Family

## 2020-02-29 ENCOUNTER — Ambulatory Visit: Payer: Self-pay | Admitting: Pharmacist

## 2020-02-29 ENCOUNTER — Other Ambulatory Visit: Payer: Self-pay | Admitting: Family

## 2020-02-29 MED ORDER — FLUTICASONE-SALMETEROL 500-50 MCG/DOSE IN AEPB: 1.0000 | INHALATION_SPRAY | Freq: Two times a day (BID) | RESPIRATORY_TRACT | 4 refills | Status: DC

## 2020-02-29 NOTE — Chronic Care Management (AMB) (Signed)
Patient returned appropriate documents to submit patient assistance applications for Advair and Entresto.  Completed Advair application, received script from PCP, and faxed to [employer] today (02/29/20) at 4:41pm.   Will coordinate with cardio office to complete application for Entresto.   Katrinka Blazing, PharmD, BCACP Clinical Pharmacist [facility] at [facility] [phone]

## 2020-03-04 DIAGNOSIS — M5442 Lumbago with sciatica, left side: Secondary | ICD-10-CM | POA: Diagnosis not present

## 2020-03-04 DIAGNOSIS — M6281 Muscle weakness (generalized): Secondary | ICD-10-CM | POA: Diagnosis not present

## 2020-03-06 DIAGNOSIS — M5442 Lumbago with sciatica, left side: Secondary | ICD-10-CM | POA: Diagnosis not present

## 2020-03-06 DIAGNOSIS — M6281 Muscle weakness (generalized): Secondary | ICD-10-CM | POA: Diagnosis not present

## 2020-03-18 DIAGNOSIS — M5442 Lumbago with sciatica, left side: Secondary | ICD-10-CM | POA: Diagnosis not present

## 2020-03-18 DIAGNOSIS — M6281 Muscle weakness (generalized): Secondary | ICD-10-CM | POA: Diagnosis not present

## 2020-03-28 ENCOUNTER — Other Ambulatory Visit: Payer: Self-pay | Admitting: Medical

## 2020-03-28 ENCOUNTER — Other Ambulatory Visit: Payer: Self-pay | Admitting: Family

## 2020-04-04 ENCOUNTER — Telehealth: Payer: Self-pay | Admitting: Cardiology

## 2020-04-04 ENCOUNTER — Other Ambulatory Visit: Payer: Self-pay | Admitting: Family

## 2020-04-04 MED ORDER — LOSARTAN POTASSIUM 50 MG PO TABS: 50.0000 mg | ORAL_TABLET | Freq: Every day | ORAL | 1 refills | Status: DC

## 2020-04-04 NOTE — Telephone Encounter (Signed)
Let patient know that I sent in a refill for his Losartan.

## 2020-04-04 NOTE — Telephone Encounter (Signed)
*  STAT* If patient is at the pharmacy, call can be transferred to refill team.   1. Which medications need to be refilled? (please list name of each medication and dose if known) losartan (COZAAR) 50 MG tablet  2. Which pharmacy/location (including street and city if local pharmacy) is medication to be sent to? [facility] - [place], [place] - [address]  3. Do they need a 30 day or 90 day supply? 90

## 2020-04-22 DIAGNOSIS — H2512 Age-related nuclear cataract, left eye: Secondary | ICD-10-CM | POA: Diagnosis not present

## 2020-04-22 DIAGNOSIS — H25012 Cortical age-related cataract, left eye: Secondary | ICD-10-CM | POA: Diagnosis not present

## 2020-04-24 ENCOUNTER — Ambulatory Visit (INDEPENDENT_AMBULATORY_CARE_PROVIDER_SITE_OTHER): Payer: Medicare Other

## 2020-04-24 ENCOUNTER — Telehealth: Payer: Self-pay | Admitting: Pharmacist

## 2020-04-24 DIAGNOSIS — I5022 Chronic systolic (congestive) heart failure: Secondary | ICD-10-CM

## 2020-04-24 DIAGNOSIS — N400 Enlarged prostate without lower urinary tract symptoms: Secondary | ICD-10-CM

## 2020-04-24 DIAGNOSIS — J45909 Unspecified asthma, uncomplicated: Secondary | ICD-10-CM | POA: Diagnosis not present

## 2020-04-24 DIAGNOSIS — I10 Essential (primary) hypertension: Secondary | ICD-10-CM | POA: Diagnosis not present

## 2020-04-24 NOTE — Patient Instructions (Addendum)
Visit Information  Goals Addressed            This Visit's Progress   . Track and Manage Fluids and Swelling-Heart Failure       Timeframe:  Long-Range Goal Priority:  Medium Start Date:     04/24/20                        Expected End Date:   10/22/20                    Follow Up Date 06/12/20   - call office if I gain more than 2 pounds in one day or 5 pounds in one week - watch for swelling in feet, ankles and legs every day - weigh myself daily    Why is this important?    It is important to check your weight daily and watch how much salt and liquids you have.   It will help you to manage your heart failure.    Notes:     . Track and Manage My Blood Pressure-Hypertension       Timeframe:  Long-Range Goal Priority:  Medium Start Date:    04/24/20                         Expected End Date:  10/22/20                     Follow Up Date 06/12/20   - check blood pressure 3 times per week - write blood pressure results in a log or diary    Why is this important?    You won't feel high blood pressure, but it can still hurt your blood vessels.   High blood pressure can cause heart or kidney problems. It can also cause a stroke.   Making lifestyle changes like losing a little weight or eating less salt will help.   Checking your blood pressure at home and at different times of the day can help to control blood pressure.   If the doctor prescribes medicine remember to take it the way the doctor ordered.   Call the office if you cannot afford the medicine or if there are questions about it.     Notes:       Patient Care Plan: General Pharmacy (Adult)    Problem Identified: HTN, HF, Asthma   Priority: High  Onset Date: 04/24/2020    Long-Range Goal: Patient-Specific Goal   Start Date: 04/24/2020  Expected End Date: 10/22/2020  This Visit's Progress: On track  Priority: High  Note:   Current Barriers:  . Unable to independently afford treatment regimen . Need  for home monitoring of chronic conditions.  Pharmacist Clinical Goal(s):  Marland Kitchen Over the next 90 days, patient will verbalize ability to afford treatment regimen . achieve adherence to monitoring guidelines and medication adherence to achieve therapeutic efficacy through collaboration with PharmD and provider.    Interventions: . 1:1 collaboration with [name], NP regarding development and update of comprehensive plan of care as evidenced by provider attestation and co-signature . Inter-disciplinary care team collaboration (see longitudinal plan of care) . Comprehensive medication review performed; medication list updated in electronic medical record  Hypertension (BP goal <140/90) -controlled -Current treatment:  Amlodipine 10mg  daily   Metoprolol Succinate 25mg  daily  Losartan 50mg  daily (recently switched from Entresto due to cost)  Spironolactone 25mg  daily -Medications previously tried: none  noted -Current home readings: unable to provide  -Denies hypotensive/hypertensive symptoms -Educated on Daily salt intake goal < 2300 mg; Importance of home blood pressure monitoring; Symptoms of hypotension and importance of maintaining adequate hydration; -Counseled to monitor BP at home a few times per week, document, and provide log at future appointments -Recommended to continue current medication   Heart Failure (Goal: control symptoms and prevent exacerbations) controlled Type: Systolic -NYHA Class: II (slight limitation of activity) -Ejection fraction: 01/02/2019 45-50% -Current treatment:  Furosemide 40mg  daily   Metoprolol Succinate 25mg  daily  Spironolactone 25mg  daily  Losartan 50mg   -Medications previously tried: none noted -Current home BP/HR readings: none provided  - Entresto PAP? - never completed, will have this started by , will be prescribed by Dr. - Edema? - Denies - SOB? - Denies -Monitoring weight? - not  currently -Educated on Benefits of medications for managing symptoms and prolonging life Importance of weighing daily; if you gain more than 3 pounds in one day or 5 pounds in one week, contact providers Importance of blood pressure control -Recommended to continue current medication Recommended to monitor weight daily Assessed patient finances. Will begin application for PAP for Entresto, once approved would recommend he replace Losartan with Entresto due to mortality benefit  Asthma (Goal: control symptoms and prevent exacerbations) -controlled -Current treatment   Advair 500/47mcg 1 puff twice daily  Montelukast 10mg  daily   ProAir 2 puffs every 6 hours as needed -Medications previously tried: none noted   -Exacerbations requiring treatment in last 6 months: none -Patient reports consistent use of maintenance inhaler -Frequency of rescue inhaler use: as needed (walking up stairs) - PAP Advair? - yes, approved -Counseled on Benefits of consistent maintenance inhaler use When to use rescue inhaler Differences between maintenance and rescue inhalers -Recommended to continue current medication  BPH (Goal: Minimize symptoms) -controlled -Current treatment   Tamsulosin 0.4mg  daily -Medications previously tried: none noted -Overnight urination? - none -Stream? - sufficient - Emptying bladder? - yes -Recommended to continue current medication   Patient Goals/Self-Care Activities . Over the next 90 days, patient will:  - take medications as prescribed focus on medication adherence by pill box weigh daily, and contact provider if weight gain of 3 pounds in one day or 5 pounds in a week collaborate with provider on medication access solutions  Follow Up Plan: The care management team will reach out to the patient again over the next 90 days.       The patient verbalized understanding of instructions, educational materials, and care plan provided today and agreed to receive  a mailed copy of patient instructions, educational materials, and care plan.   Telephone follow up appointment with pharmacy team member scheduled for: 3 months  , [facility]  Heart Failure, Self-Care Heart failure is a serious condition. The following information explains things you need to do to take care of yourself at home. To help you stay as healthy as possible, you may be asked to change your diet, take certain medicines, and make other changes in your life. Your doctor may also give you more specific instructions. If you have problems or questions, call your doctor. What are the risks? Having heart failure makes it more likely for you to have some problems. These problems can get worse if you do not take good care of yourself. Problems may include:  Damage to the kidneys, liver, or lungs.  Malnutrition.  Abnormal heart rhythms.  Blood clotting problems that could cause a  stroke. Supplies needed:  Scale for weighing yourself.  Blood pressure monitor.  Notebook.  Medicines. How to care for yourself when you have heart failure Medicines Take over-the-counter and prescription medicines only as told by your doctor. Take your medicines every day.  Do not stop taking your medicine unless your doctor tells you to do so.  Do not skip any medicines.  Get your prescriptions refilled before you run out of medicine. This is important.  Talk with your doctor if you cannot afford your medicines. Eating and drinking  Eat heart-healthy foods. Talk with a diet specialist (dietitian) to create an eating plan.  Limit salt (sodium) if told by your doctor. Ask your diet specialist to tell you which seasonings are healthy for your heart.  Cook in healthy ways instead of frying. Healthy ways of cooking include roasting, grilling, broiling, baking, poaching, steaming, and stir-frying.  Choose foods that: ? Have no trans fat. ? Are low in saturated fat and  cholesterol.  Choose healthy foods, such as: ? Fresh or frozen fruits and vegetables. ? Fish. ? Low-fat (lean) meats. ? Legumes, such as beans, peas, and lentils. ? Fat-free or low-fat dairy products. ? Whole-grain foods. ? High-fiber foods.  Limit how much fluid you drink, if told by your doctor.   Alcohol use  Do not drink alcohol if: ? Your doctor tells you not to drink. ? Your heart was damaged by alcohol, or you have very bad heart failure. ? You are pregnant, may be pregnant, or are planning to become pregnant.  If you drink alcohol: ? Limit how much you have to:  0-1 drink a day for women.  0-2 drinks a day for men. ? Know how much alcohol is in your drink. In the [place] one drink equals one 12 oz bottle of beer (355 mL), one 5 oz glass of wine (148 mL), or one 1 oz glass of hard liquor (44 mL). Lifestyle  Do not smoke or use any products that contain nicotine or tobacco. If you need help quitting, ask your doctor. ? Do not use nicotine gum or patches before talking to your doctor.  Do not use illegal drugs.  Lose weight if told by your doctor.  Do physical activity if told by your doctor. Talk to your doctor before you begin an exercise if: ? You are an older adult. ? You have very bad heart failure.  Learn to manage stress. If you need help, ask your doctor.  Get physical rehab (rehabilitation) to help you stay independent and to help with your quality of life.  Participate in a cardiac rehab program. This program helps you improve your health through exercise, education, and counseling.  Plan time to rest when you get tired.   Check weight and blood pressure  Weigh yourself every day. This will help you to know if fluid is building up in your body. ? Weigh yourself every morning after you pee (urinate) and before you eat breakfast. ? Wear the same amount of clothing each time. ? Write down your daily weight. Give your record to your doctor.  Check and  write down your blood pressure as told by your doctor.  Check your pulse as told by your doctor.   Dealing with very hot and very cold weather  If it is very hot: ? Avoid activities that take a lot of energy. ? Use air conditioning or fans, or find a cooler place. ? Avoid caffeine and alcohol. ? Wear clothing that  is loose-fitting, lightweight, and light-colored.  If it is very cold: ? Avoid activities that take a lot of energy. ? Layer your clothes. ? Wear mittens or gloves, a hat, and a face covering when you go outside. ? Avoid alcohol. Follow these instructions at home:  Stay up to date with shots (vaccines). Get pneumococcal and flu (influenza) shots.  Keep all follow-up visits. Contact a doctor if:  You gain 2-3 lb (1-1.4 kg) in 24 hours or 5 lb (2.3 kg) in a week.  You have increasing shortness of breath.  You cannot do your normal activities.  You get tired easily.  You cough a lot.  You do not feel like eating or feel like you may vomit (nauseous).  You have swelling in your hands, feet, ankles, or belly (abdomen).  You cannot sleep well because it is hard to breathe.  You feel like your heart is beating fast (palpitations).  You get dizzy when you stand up.  You feel depressed or sad. Get help right away if:  You have trouble breathing.  You or someone else notices a change in your behavior, such as having trouble staying awake.  You have chest pain or discomfort.  You pass out (faint). These symptoms may be an emergency. Get help right away. Call your local emergency services (911 in the [place]  Do not wait to see if the symptoms will go away.  Do not drive yourself to the hospital. Summary  Heart failure is a serious condition. To care for yourself, you may have to change your diet, take medicines, and make other lifestyle changes.  Take your medicines every day. Do not stop taking them unless your doctor tells you to do so.  Limit salt and  eat heart-healthy foods.  Ask your doctor if you can drink alcohol. You may have to stop alcohol use if you have very bad heart failure.  Contact your doctor if you gain weight quickly or feel that your heart is beating too fast. Get help right away if you pass out or have chest pain or trouble breathing. This information is not intended to replace advice given to you by your health care provider. Make sure you discuss any questions you have with your health care provider. Document Revised: 09/22/2019 Document Reviewed: 09/22/2019 Elsevier Patient Education  2021 [employer].

## 2020-04-24 NOTE — Progress Notes (Signed)
Chronic Care Management Pharmacy Note  04/24/2020 Name:  Brandon [name] MRN:  [id number] DOB:  February 09, 1941  Subjective: Brandon [name] is an 80 y.o. year old male who is a primary patient of Debbrah Alar, NP.  The CCM team was consulted for assistance with disease management and care coordination needs.    Engaged with patient by telephone for follow up visit in response to provider referral for pharmacy case management and/or care coordination services.   Consent to Services:  The patient was given the following information about Chronic Care Management services today, agreed to services, and gave verbal consent: 1. CCM service includes personalized support from designated clinical staff supervised by the primary care provider, including individualized plan of care and coordination with other care providers 2. 24/7 contact phone numbers for assistance for urgent and routine care needs. 3. Service will only be billed when office clinical staff spend 20 minutes or more in a month to coordinate care. 4. Only one practitioner may furnish and bill the service in a calendar month. 5.The patient may stop CCM services at any time (effective at the end of the month) by phone call to the office staff. 6. The patient will be responsible for cost sharing (co-pay) of up to 20% of the service fee (after annual deductible is met). Patient agreed to services and consent obtained.  Patient Care Team: Debbrah Alar, NP as PCP - General (Internal Medicine) Minus Breeding, MD as PCP - Cardiology (Cardiology) Edythe Clarity, [facility] (Pharmacist)  Recent office visits: 11/23/19: Visit w/ Debbrah Alar, NP - No med changes noted  Recent consult visits: 12/25/19: Cardio message to stop entresto and start losartan $RemoveBefore'50mg'nnvHcgMhzBMAo$  daily  10/18/19: Cardio visit w/ Dr. Percival Spanish - Losartan to entresto.  Hospital visits: None in previous 6 months  Objective:  Lab Results  Component Value Date    CREATININE 1.69 (H) 11/02/2019   BUN 22 11/02/2019   GFR 68.85 01/12/2019   GFRNONAA 38 (L) 11/02/2019   GFRAA 44 (L) 11/02/2019   NA 140 11/02/2019   K 5.0 11/02/2019   CALCIUM 9.3 11/02/2019   CO2 23 11/02/2019    Lab Results  Component Value Date/Time   HGBA1C 6.3 11/07/2018 11:59 AM   HGBA1C 6.0 12/20/2017 01:29 PM   GFR 68.85 01/12/2019 02:11 PM   GFR 60.83 11/07/2018 11:59 AM    Last diabetic Eye exam: No results found for: HMDIABEYEEXA  Last diabetic Foot exam: No results found for: HMDIABFOOTEX   Lab Results  Component Value Date   CHOL 149 12/09/2015   HDL 50.30 12/09/2015   LDLCALC 86 12/09/2015   TRIG 63.0 12/09/2015   CHOLHDL 3 12/09/2015    Hepatic Function Latest Ref Rng & Units 04/24/2019 04/12/2019 04/12/2019  Total Protein 6.5 - 8.1 g/dL 7.0 6.2 6.4  Albumin 3.5 - 5.0 g/dL 3.5 - -  AST 15 - 41 U/L 17 - 13  ALT 0 - 44 U/L 12 - 11  Alk Phosphatase 38 - 126 U/L 61 - -  Total Bilirubin 0.3 - 1.2 mg/dL 0.7 - 0.4  Bilirubin, Direct 0.0 - 0.3 mg/dL - - -    Lab Results  Component Value Date/Time   TSH 1.06 01/12/2019 02:11 PM   TSH 1.84 12/27/2011 11:55 AM    CBC Latest Ref Rng & Units 04/24/2019 04/12/2019 01/12/2019  WBC 4.0 - 10.5 K/uL 10.0 8.3 7.6  Hemoglobin 13.0 - 17.0 g/dL 12.7(L) 12.8(L) 10.0(L)  Hematocrit 39.0 - 52.0 % 40.7 38.2(L)  30.3(L)  Platelets 150 - 400 K/uL 285 49(L) 437.0(H)    No results found for: VD25OH  Clinical ASCVD: No  The ASCVD Risk score Mikey Bussing DC Jr., et al., 2013) failed to calculate for the following reasons:   Cannot find a previous HDL lab   Cannot find a previous total cholesterol lab    Depression screen [facility] 2/9 04/22/2017 12/09/2015 05/29/2014  Decreased Interest 0 0 0  Down, Depressed, Hopeless 0 0 0  PHQ - 2 Score 0 0 0      Social History   Tobacco Use  Smoking Status Former Smoker  Smokeless Tobacco Never Used  Tobacco Comment   quit smoling in 1990. started when 18, 1 ppd   BP Readings from Last 3  Encounters:  01/24/20 (!) 146/88  11/23/19 124/76  10/18/19 125/74   Pulse Readings from Last 3 Encounters:  01/24/20 67  11/23/19 91  10/18/19 73   Wt Readings from Last 3 Encounters:  01/24/20 245 lb 6.4 oz (111.3 kg)  11/23/19 246 lb (111.6 kg)  10/18/19 250 lb (113.4 kg)    Assessment/Interventions: Review of patient past medical history, allergies, medications, health status, including review of consultants reports, laboratory and other test data, was performed as part of comprehensive evaluation and provision of chronic care management services.   SDOH:  (Social Determinants of Health) assessments and interventions performed: No   CCM Care Plan  Allergies  Allergen Reactions  . Ace Inhibitors Cough  . Isosorb Dinitrate-Hydralazine Other (See Comments)    REACTION: dizziness/hypotensive    Medications Reviewed Today    Reviewed by Edythe Clarity, [facility] (Pharmacist) on 04/24/20 at [phone]  Med List Status: <None>  Medication Order Taking? Sig Documenting Provider Last Dose Status Informant  amLODipine (NORVASC) 10 MG tablet [id number] Yes Take 1 tablet (10 mg total) by mouth daily. Debbrah Alar, NP Taking Active   aspirin 81 MG tablet [id number] Yes Take 81 mg by mouth daily. [provider] Taking Active   b complex vitamins tablet [id number] Yes Take 1 tablet by mouth daily. Debbrah Alar, NP Taking Active   clindamycin (CLEOCIN) 300 MG capsule [id number] Yes Take 1 capsule (300 mg total) by mouth 3 (three) times daily. Debbrah Alar, NP Taking Active   Fluticasone-Salmeterol (ADVAIR) 500-50 MCG/DOSE AEPB [id number] Yes Inhale 1 puff into the lungs in the morning and at bedtime. Debbrah Alar, NP Taking Active   furosemide (LASIX) 40 MG tablet [id number] Yes Take 1 tablet (40 mg total) by mouth daily. Almyra Deforest, [place] Taking Active   leflunomide (ARAVA) 20 MG tablet [id number] Yes Take 20 mg by mouth daily. [provider] Taking Active    losartan (COZAAR) 50 MG tablet [id number] Yes Take 1 tablet (50 mg total) by mouth daily. Minus Breeding, MD Taking Active   metoprolol succinate (TOPROL-XL) 25 MG 24 hr tablet [id number] Yes Take 1 tablet by mouth once daily Debbrah Alar, NP Taking Active   montelukast (SINGULAIR) 10 MG tablet [id number] Yes Take 1 tablet (10 mg total) by mouth at bedtime. Debbrah Alar, NP Taking Active   PROAIR HFA 108 [phone] Base) MCG/ACT inhaler [id number] Yes INHALE 2 PUFFS BY MOUTH EVERY 6 HOURS AS NEEDED FOR WHEEZING AND SHORTNESS OF Monica Martinez, Lenna Sciara, NP Taking Active   spironolactone (ALDACTONE) 25 MG tablet [id number] Yes Take 1 tablet (25 mg total) by mouth daily. Debbrah Alar, NP Taking Active   tamsulosin [facility]) 0.4 MG CAPS capsule [id number] Yes Take 1 capsule by mouth once daily  Debbrah Alar, NP Taking Active           Patient Active Problem List   Diagnosis Date Noted  . Educated about COVID-19 virus infection 10/17/2019  . Stage 3a chronic kidney disease ([place]) 10/17/2019  . Leg swelling 10/17/2019  . Heme positive stool 02/01/2019  . RBBB 10/03/2018  . Bilateral shoulder pain 08/24/2016  . Depression 12/09/2015  . Pain and swelling of knee 06/14/2014  . Edema 12/06/2011  . Asthma 08/13/2009  . Hyperglycemia 08/13/2009  . Obstructive sleep apnea 01/23/2009  . CHRONIC SYSTOLIC HEART FAILURE 84/69/6295  . OBESITY, UNSPECIFIED 08/26/2008  . 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 Essential hypertension 06/28/2008  . Nonischemic cardiomyopathy ([place]) 06/28/2008    Immunization History  Administered Date(s) Administered  . Fluad Quad(high Dose 65+) 11/21/2018  . Influenza Split 02/24/2011, 12/03/2011  . Influenza Whole 03/16/2007, 12/29/2009  . Influenza, High Dose Seasonal PF 02/26/2015, 12/09/2015, 04/22/2017, 12/14/2017  . Influenza,inj,Quad PF,6+ Mos 02/13/2013, 11/14/2013  . Moderna Sars-Covid-2 Vaccination 04/08/2019,  05/06/2019, 11/27/2019  . Pneumococcal Conjugate-13 11/14/2013  . Pneumococcal Polysaccharide-23 08/13/2009  . Td 07/26/2006  . Tdap 09/22/2012, 05/23/2017  . Zoster 05/17/2011    Conditions to be addressed/monitored:  Hypertension, Pre-Diabetes, Heart Failure, Asthma, BPH, Rheumatoid Arthritis   Care Plan : General Pharmacy (Adult)  Updates made by Edythe Clarity, RPH since 04/24/2020 12:00 AM    Problem: HTN, HF, Asthma   Priority: High  Onset Date: 04/24/2020    Long-Range Goal: Patient-Specific Goal   Start Date: 04/24/2020  Expected End Date: 10/22/2020  This Visit's Progress: On track  Priority: High  Note:   Current Barriers:  . Unable to independently afford treatment regimen . Need for home monitoring of chronic conditions.  Pharmacist Clinical Goal(s):  Marland Kitchen Over the next 90 days, patient will verbalize ability to afford treatment regimen . achieve adherence to monitoring guidelines and medication adherence to achieve therapeutic efficacy through collaboration with PharmD and provider.    Interventions: . 1:1 collaboration with Debbrah Alar, NP regarding development and update of comprehensive plan of care as evidenced by provider attestation and co-signature . Inter-disciplinary care team collaboration (see longitudinal plan of care) . Comprehensive medication review performed; medication list updated in electronic medical record  Hypertension (BP goal <140/90) -controlled -Current treatment:  Amlodipine 10mg  daily   Metoprolol Succinate 25mg  daily  Losartan 50mg  daily (recently switched from Entresto due to cost)  Spironolactone 25mg  daily -Medications previously tried: none noted -Current home readings: unable to provide  -Denies hypotensive/hypertensive symptoms -Educated on Daily salt intake goal < 2300 mg; Importance of home blood pressure monitoring; Symptoms of hypotension and importance of maintaining adequate hydration; -Counseled to  monitor BP at home a few times per week, document, and provide log at future appointments -Recommended to continue current medication   Heart Failure (Goal: control symptoms and prevent exacerbations) controlled Type: Systolic -NYHA Class: II (slight limitation of activity) -Ejection fraction: 01/02/2019 45-50% -Current treatment:  Furosemide 40mg  daily   Metoprolol Succinate 25mg  daily  Spironolactone 25mg  daily  Losartan 50mg   -Medications previously tried: none noted -Current home BP/HR readings: none provided  - Entresto PAP? - never completed, will have this started by [place], will be prescribed by Dr. Percival Spanish - Edema? - Denies - SOB? - Denies -Monitoring weight? - not currently -Educated on Benefits of medications for managing symptoms and prolonging life Importance of weighing daily; if you gain more than 3 pounds in one day or 5 pounds in  one week, contact providers Importance of blood pressure control -Recommended to continue current medication Recommended to monitor weight daily Assessed patient finances. Will begin application for PAP for Entresto, once approved would recommend he replace Losartan with Entresto due to mortality benefit  Asthma (Goal: control symptoms and prevent exacerbations) -controlled -Current treatment   Advair 500/52mg 1 puff twice daily  Montelukast 1540mdaily   ProAir 2 puffs every 6 hours as needed -Medications previously tried: none noted   -Exacerbations requiring treatment in last 6 months: none -Patient reports consistent use of maintenance inhaler -Frequency of rescue inhaler use: as needed (walking up stairs) - PAP Advair? - yes, approved -Counseled on Benefits of consistent maintenance inhaler use When to use rescue inhaler Differences between maintenance and rescue inhalers -Recommended to continue current medication  BPH (Goal: Minimize symptoms) -controlled -Current treatment   Tamsulosin 0.40m16mdaily -Medications previously tried: none noted -Overnight urination? - none -Stream? - sufficient - Emptying bladder? - yes -Recommended to continue current medication   Patient Goals/Self-Care Activities . Over the next 90 days, patient will:  - take medications as prescribed focus on medication adherence by pill box weigh daily, and contact provider if weight gain of 3 pounds in one day or 5 pounds in a week collaborate with provider on medication access solutions  Follow Up Plan: The care management team will reach out to the patient again over the next 90 days.        Medication Assistance: Advair obtained through medication assistance program.  Application for Entresto in progress, patient on Losartan currently.  Patient's [facility]:  [facility] [id number]: [phone]: [phone] pill box? Yes Pt endorses 100% compliance  We discussed: Benefits of medication synchronization, packaging and delivery as well as enhanced pharmacist oversight with [employer]. Patient decided to: Continue current medication management strategy  Care Plan and Follow Up Patient Decision:  Patient agrees to Care Plan and Follow-up.  Plan: The care management team will reach out to the patient again over the next 90 days.  ChrBeverly MilchharmD Clinical Pharmacist [phone]

## 2020-04-24 NOTE — Progress Notes (Addendum)
° ° °  Chronic Care Management Pharmacy Assistant   Name: Ray E [place]  MRN: [id number] DOB: February 20, 1941  Reason for Encounter: PAP   PCP : [name], NP  Allergies:   Allergies  Allergen Reactions   Ace Inhibitors Cough   Isosorb Dinitrate-Hydralazine Other (See Comments)    REACTION: dizziness/hypotensive    Medications: Outpatient Encounter Medications as of 04/24/2020  Medication Sig   amLODipine (NORVASC) 10 MG tablet Take 1 tablet (10 mg total) by mouth daily.   aspirin 81 MG tablet Take 81 mg by mouth daily.   b complex vitamins tablet Take 1 tablet by mouth daily.   clindamycin (CLEOCIN) 300 MG capsule Take 1 capsule (300 mg total) by mouth 3 (three) times daily.   Fluticasone-Salmeterol (ADVAIR) 500-50 MCG/DOSE AEPB Inhale 1 puff into the lungs in the morning and at bedtime.   furosemide (LASIX) 40 MG tablet Take 1 tablet (40 mg total) by mouth daily.   leflunomide (ARAVA) 20 MG tablet Take 20 mg by mouth daily.   losartan (COZAAR) 50 MG tablet Take 1 tablet (50 mg total) by mouth daily.   metoprolol succinate (TOPROL-XL) 25 MG 24 hr tablet Take 1 tablet by mouth once daily   montelukast (SINGULAIR) 10 MG tablet Take 1 tablet (10 mg total) by mouth at bedtime.   PROAIR HFA 108 (90 Base) MCG/ACT inhaler INHALE 2 PUFFS BY MOUTH EVERY 6 HOURS AS NEEDED FOR WHEEZING AND SHORTNESS OF BREATH   spironolactone (ALDACTONE) 25 MG tablet Take 1 tablet (25 mg total) by mouth daily.   tamsulosin (FLOMAX) 0.4 MG CAPS capsule Take 1 capsule by mouth once daily   No facility-administered encounter medications on file as of 04/24/2020.    Current Diagnosis: Patient Active Problem List   Diagnosis Date Noted   Educated about COVID-19 virus infection 10/17/2019   Stage 3a chronic kidney disease (HCC) 10/17/2019   Leg swelling 10/17/2019   Heme positive stool 02/01/2019   RBBB 10/03/2018   Bilateral shoulder pain 08/24/2016   Depression 12/09/2015   Pain and swelling of  knee 06/14/2014   Edema 12/06/2011   Asthma 08/13/2009   Hyperglycemia 08/13/2009   Obstructive sleep apnea 01/23/2009   CHRONIC SYSTOLIC HEART FAILURE 09/11/2008   OBESITY, UNSPECIFIED 08/26/2008   Disorder resulting from impaired renal function 08/26/2008   Essential hypertension 06/28/2008   Nonischemic cardiomyopathy (HCC) 06/28/2008    Goals Addressed   None    New patient assistance application form filled out to [employer]  for [name] 49-51 mg. Waiting for patient and provider to complete and sign documentation. Called patient to inquire if they wanted the application mailed to them or if they wanted to come into the office. Patient is required to sign application and to bring/have proof of income he stated he would be willing to come into office to bring proof of income and sign application once he received all of the needed documents on his end he would like th PAP form mailed to his residence address [address] [place] [address].  Follow-Up:  Patient Assistance Coordination and Pharmacist Review   Hulen Luster, [place] Clinical Pharmacist Assistant [phone]   2 minutes spent in review, coordination, and documentation.  Reviewed by: Willa Frater, PharmD Clinical Pharmacist [facility] [phone]

## 2020-05-15 ENCOUNTER — Telehealth: Payer: Self-pay | Admitting: Family

## 2020-05-15 ENCOUNTER — Other Ambulatory Visit: Payer: Self-pay | Admitting: Family

## 2020-05-15 NOTE — Telephone Encounter (Signed)
Patient came and dropped of forms for the new pharmacist coming   I told patient I would give to [facility] that way we have them somewhere here!  Paperwork put into [place] bin up front

## 2020-05-16 NOTE — Telephone Encounter (Signed)
noted 

## 2020-05-19 NOTE — Telephone Encounter (Signed)
Form started, put in provider's folder

## 2020-05-21 ENCOUNTER — Telehealth: Payer: Self-pay | Admitting: Family

## 2020-05-21 NOTE — Telephone Encounter (Signed)
I received [employer] pt assistance form for [name].  Since this medication is being started by cardiology- his cardiologist should complete the rx please.

## 2020-05-21 NOTE — Telephone Encounter (Signed)
Patient advised he need to take the form to cardiology. He will pick up at the front desk

## 2020-05-22 ENCOUNTER — Other Ambulatory Visit: Payer: Self-pay | Admitting: Family

## 2020-05-27 DIAGNOSIS — R21 Rash and other nonspecific skin eruption: Secondary | ICD-10-CM | POA: Diagnosis not present

## 2020-05-27 DIAGNOSIS — M5136 Other intervertebral disc degeneration, lumbar region: Secondary | ICD-10-CM | POA: Diagnosis not present

## 2020-05-27 DIAGNOSIS — M0579 Rheumatoid arthritis with rheumatoid factor of multiple sites without organ or systems involvement: Secondary | ICD-10-CM | POA: Diagnosis not present

## 2020-05-27 DIAGNOSIS — N1832 Chronic kidney disease, stage 3b: Secondary | ICD-10-CM | POA: Diagnosis not present

## 2020-05-27 DIAGNOSIS — Z79899 Other long term (current) drug therapy: Secondary | ICD-10-CM | POA: Diagnosis not present

## 2020-06-09 DIAGNOSIS — H5203 Hypermetropia, bilateral: Secondary | ICD-10-CM | POA: Diagnosis not present

## 2020-06-09 DIAGNOSIS — H52201 Unspecified astigmatism, right eye: Secondary | ICD-10-CM | POA: Diagnosis not present

## 2020-06-09 DIAGNOSIS — H524 Presbyopia: Secondary | ICD-10-CM | POA: Diagnosis not present

## 2020-07-02 DIAGNOSIS — Z23 Encounter for immunization: Secondary | ICD-10-CM | POA: Diagnosis not present

## 2020-07-07 ENCOUNTER — Other Ambulatory Visit: Payer: Self-pay | Admitting: Family

## 2020-07-24 ENCOUNTER — Other Ambulatory Visit: Payer: Self-pay | Admitting: Family

## 2020-07-25 ENCOUNTER — Ambulatory Visit (INDEPENDENT_AMBULATORY_CARE_PROVIDER_SITE_OTHER): Payer: Medicare Other | Admitting: Pharmacist

## 2020-07-25 DIAGNOSIS — I5022 Chronic systolic (congestive) heart failure: Secondary | ICD-10-CM

## 2020-07-25 DIAGNOSIS — M059 Rheumatoid arthritis with rheumatoid factor, unspecified: Secondary | ICD-10-CM | POA: Diagnosis not present

## 2020-07-25 DIAGNOSIS — N400 Enlarged prostate without lower urinary tract symptoms: Secondary | ICD-10-CM

## 2020-07-25 DIAGNOSIS — I10 Essential (primary) hypertension: Secondary | ICD-10-CM | POA: Diagnosis not present

## 2020-07-25 DIAGNOSIS — J45909 Unspecified asthma, uncomplicated: Secondary | ICD-10-CM | POA: Diagnosis not present

## 2020-07-25 NOTE — Chronic Care Management (AMB) (Signed)
Chronic Care Management Pharmacy Note  07/28/2020 Name:  Brandon [name] MRN:  [id number] DOB:  Aug 25, 1940  Subjective: Brandon [name] is an 80 y.o. year old male who is a primary patient of Debbrah Alar, NP.  The CCM team was consulted for assistance with disease management and care coordination needs.    Engaged with patient by telephone for follow up visit in response to provider referral for pharmacy case management and/or care coordination services.   Consent to Services:  The patient was given information about Chronic Care Management services, agreed to services, and gave verbal consent prior to initiation of services.  Please see initial visit note for detailed documentation.   Patient Care Team: Debbrah Alar, NP as PCP - General (Internal Medicine) Minus Breeding, MD as PCP - Cardiology (Cardiology) Cherre Robins, PharmD (Pharmacist)  Recent office visits: 06/09/2020: ophthalmology (Dr Amalia Hailey [facility]) post op exam following cataract procedure to left eye on 05/12/2020. Stopped prednisolone.   11/23/19:PCP [name], NP) -No med changes noted  Recent consult visits: 12/25/19: [place] phone message to stop entresto and start losartan 18m daily  10/18/19: Cardio (Dr. HPercival Spanish F/U CHF.  Losartan changed to entresto.  Hospital visits: None in previous 6 months  Objective:  Lab Results  Component Value Date   CREATININE 1.69 (H) 11/02/2019   CREATININE 1.61 (H) 06/15/2019   CREATININE 1.44 (H) 06/04/2019    Lab Results  Component Value Date   HGBA1C 6.3 11/07/2018   Last diabetic Eye exam: No results found for: HMDIABEYEEXA  Last diabetic Foot exam: No results found for: HMDIABFOOTEX      Component Value Date/Time   CHOL 149 12/09/2015 1124   TRIG 63.0 12/09/2015 1124   HDL 50.30 12/09/2015 1124   CHOLHDL 3 12/09/2015 1124   VLDL 12.6 12/09/2015 1124   LDLCALC 86 12/09/2015 1124    Hepatic Function Latest Ref Rng & Units 04/24/2019  04/12/2019 04/12/2019  Total Protein 6.5 - 8.1 g/dL 7.0 6.2 6.4  Albumin 3.5 - 5.0 g/dL 3.5 - -  AST 15 - 41 U/L 17 - 13  ALT 0 - 44 U/L 12 - 11  Alk Phosphatase 38 - 126 U/L 61 - -  Total Bilirubin 0.3 - 1.2 mg/dL 0.7 - 0.4  Bilirubin, Direct 0.0 - 0.3 mg/dL - - -    Lab Results  Component Value Date/Time   TSH 1.06 01/12/2019 02:11 PM   TSH 1.84 12/27/2011 11:55 AM    CBC Latest Ref Rng & Units 04/24/2019 04/12/2019 01/12/2019  WBC 4.0 - 10.5 K/uL 10.0 8.3 7.6  Hemoglobin 13.0 - 17.0 g/dL 12.7(L) 12.8(L) 10.0(L)  Hematocrit 39.0 - 52.0 % 40.7 38.2(L) 30.3(L)  Platelets 150 - 400 K/uL 285 49(L) 437.0(H)    No results found for: VD25OH  Clinical ASCVD: No  The ASCVD Risk score (Mikey BussingDC Jr., et al., 2013) failed to calculate for the following reasons:   Cannot find a previous HDL lab   Cannot find a previous total cholesterol lab    Other: EF = 45-50% (01/02/2019)  Social History   Tobacco Use  Smoking Status Former Smoker  Smokeless Tobacco Never Used  Tobacco Comment   quit smoling in 1990. started when 18, 1 ppd   BP Readings from Last 3 Encounters:  01/24/20 (!) 146/88  11/23/19 124/76  10/18/19 125/74   Pulse Readings from Last 3 Encounters:  01/24/20 67  11/23/19 91  10/18/19 73   Wt Readings from Last 3 Encounters:  01/24/20 245 lb 6.4 oz (111.3 kg)  11/23/19 246 lb (111.6 kg)  10/18/19 250 lb (113.4 kg)    Assessment: Review of patient past medical history, allergies, medications, health status, including review of consultants reports, laboratory and other test data, was performed as part of comprehensive evaluation and provision of chronic care management services.   SDOH:  (Social Determinants of Health) assessments and interventions performed:  SDOH Interventions   Flowsheet Row Most Recent Value  SDOH Interventions   Financial Strain Interventions Other (Comment)  [Mailed applications for [employer] and Advair PAP]  Physical Activity Interventions  Other (Comments)  [Discussed aquatic PT - patient to ask Dr Amil Amen his opinion on this.]      CCM Care Plan  Allergies  Allergen Reactions  . Ace Inhibitors Cough  . Isosorb Dinitrate-Hydralazine Other (See Comments)    REACTION: dizziness/hypotensive    Medications Reviewed Today    Reviewed by Cherre Robins, PharmD (Pharmacist) on 07/25/20 at 34  Med List Status: <None>  Medication Order Taking? Sig Documenting Provider Last Dose Status Informant  amLODipine (NORVASC) 10 MG tablet [id number] Yes Take 1 tablet by mouth once daily Debbrah Alar, NP Taking Active   aspirin 81 MG tablet [id number] Yes Take 81 mg by mouth daily. [provider] Taking Active   b complex vitamins tablet [id number] Yes Take 1 tablet by mouth daily. Debbrah Alar, NP Taking Active   Fluticasone-Salmeterol (ADVAIR) 500-50 MCG/DOSE AEPB [id number] Yes Inhale 1 puff into the lungs in the morning and at bedtime. Debbrah Alar, NP Taking Active   furosemide (LASIX) 40 MG tablet [id number] Yes Take 1 tablet (40 mg total) by mouth daily. Almyra Deforest, [place] Taking Active   leflunomide (ARAVA) 20 MG tablet [id number] Yes Take 20 mg by mouth daily. [provider] Taking Active   losartan (COZAAR) 50 MG tablet [id number] Yes Take 1 tablet (50 mg total) by mouth daily. Minus Breeding, MD Taking Active   metoprolol succinate (TOPROL-XL) 25 MG 24 hr tablet [id number] Yes Take 1 tablet by mouth once daily Debbrah Alar, NP Taking Active   montelukast (SINGULAIR) 10 MG tablet [id number] Yes TAKE 1 TABLET BY MOUTH AT BEDTIME Debbrah Alar, NP Taking Active   PROAIR HFA 108 (90 Base) MCG/ACT inhaler [id number] Yes INHALE 2 PUFFS BY MOUTH EVERY 6 HOURS AS NEEDED FOR WHEEZING AND SHORTNESS OF Monica Martinez, Lenna Sciara, NP Taking Active   spironolactone (ALDACTONE) 25 MG tablet [id number] Yes Take 1 tablet (25 mg total) by mouth daily. Debbrah Alar, NP Taking Active   tamsulosin [facility])  0.4 MG CAPS capsule [id number] Yes Take 1 capsule by mouth once daily Debbrah Alar, NP Taking Active           Patient Active Problem List   Diagnosis Date Noted  . Educated about COVID-19 virus infection 10/17/2019  . Stage 3a chronic kidney disease ([place]) 10/17/2019  . Leg swelling 10/17/2019  . Heme positive stool 02/01/2019  . RBBB 10/03/2018  . Bilateral shoulder pain 08/24/2016  . Depression 12/09/2015  . Pain and swelling of knee 06/14/2014  . Edema 12/06/2011  . Asthma 08/13/2009  . Hyperglycemia 08/13/2009  . Obstructive sleep apnea 01/23/2009  . CHRONIC SYSTOLIC HEART FAILURE 49/67/5916  . OBESITY, UNSPECIFIED 08/26/2008  . 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 Essential hypertension 06/28/2008  . Nonischemic cardiomyopathy ([place]) 06/28/2008    Immunization History  Administered Date(s) Administered  . Fluad Quad(high Dose 65+) 11/21/2018  . Influenza Split 02/24/2011, 12/03/2011  . Influenza  Whole 03/16/2007, 12/29/2009  . Influenza, High Dose Seasonal PF 02/26/2015, 12/09/2015, 04/22/2017, 12/14/2017  . Influenza,inj,Quad PF,6+ Mos 02/13/2013, 11/14/2013  . Moderna Sars-Covid-2 Vaccination 04/08/2019, 05/06/2019, 11/27/2019  . Pneumococcal Conjugate-13 11/14/2013  . Pneumococcal Polysaccharide-23 08/13/2009  . Td 07/26/2006  . Tdap 09/22/2012, 05/23/2017  . Zoster 05/17/2011    Conditions to be addressed/monitored: CHF, HTN, DMII and asthma; rheumatoid arthritis; BPH; OSA; CKD-3  Care Plan : General Pharmacy (Adult)  Updates made by Cherre Robins, PHARMD since 07/28/2020 12:00 AM    Problem: HTN, HF, Asthma   Priority: High  Onset Date: 04/24/2020    Long-Range Goal: Patient-Specific Goal   Start Date: 04/24/2020  Expected End Date: 10/22/2020  Recent Progress: On track  Priority: High  Note:   Current Barriers:  . Unable to independently afford treatment regimen . Need for home monitoring of chronic  conditions.  Pharmacist Clinical Goal(s):  Marland Kitchen Over the next 90 days, patient will verbalize ability to afford treatment regimen . achieve adherence to monitoring guidelines and medication adherence to achieve therapeutic efficacy through collaboration with PharmD and provider.    Interventions: . 1:1 collaboration with Debbrah Alar, NP regarding development and update of comprehensive plan of care as evidenced by provider attestation and co-signature . Inter-disciplinary care team collaboration (see longitudinal plan of care) . Comprehensive medication review performed; medication list updated in electronic medical record  Hypertension (BP goal <140/90) . Controlled . Current treatment: o Amlodipine 7m daily  o Metoprolol Succinate 265mdaily o Losartan 5059maily (recently switched from [place] to cost) o Spironolactone 52m54mily . Medications previously tried: none noted . Current home readings: 120/135 / 60-70 . Denies dizziness or CP . Interventions:  o Reminded to limit daily salt intake goal < 2300 mg; o Recommended continue to check home blood pressure 2 to 3 times per week, record and provide log at future appointments o Recommended to continue current medication for HTN  Heart Failure (Goal: control symptoms and prevent exacerbations) . Controlled . Type: HFmrEF . NYHA Class: II (slight limitation of activity) . Ejection fraction: 01/02/2019 45-50% . Current treatment: o Furosemide 40mg26mly as needed for weight gain / edema o Metoprolol Succinate 52mg 31my o Spironolactone 52mg d69m o Losartan 50mg (r29mtly switched from [place]) . Medications previously tried: EntrestoShip brokeret is checking weight daily at home but states he is not using weights to determine if he needs to take furosemide or not. . Was given Entresto PAP in 2021 but patient did not complete- never completed. [facility] $600  OOP and that he would not qualify. There is no OOP spend for the [employer] WarnerstoArboriculturistt would like to apply this year if OK with Dr HocherinJenkins Rougeart [facility]; states he has occasional edema. Takes furosemide as needed.  . Interventions:  o Educated on symptoms of worsening heart failure and when to take as needed furosemide or call providers  - if you gain more than 3 pounds in one day or 5 pounds in one week, contact providers o Contineu to weight daily; if you gain more than 3 pounds in one day or 5 pounds in one week he is to take furosemide (per Dr HocherinJenkins Rougeould also contact providers for further instructions.  o Recommended to continue current medication o Assessed patient finances. Will begin application for PAP for Entresto and coordinate with Dr HocherinJenkins Rougelogist), once approved would  recommend he replace Losartan with Entresto due to mortality benefit  Asthma (Goal: control symptoms and prevent exacerbations) . Controlled . Current treatment: o Advair 500/544mg 1 puff twice daily o Montelukast 137mdaily  o ProAir 2 puffs every 6 hours as needed . Medications previously tried: none noted  . Exacerbations requiring treatment in last 6 months: none . Patient reports consistent use of maintenance inhaler . Using rescue inhaler about once per day - usually when working outside in yard . Interventions o Reminded to rinse mouth after each Advair use.  o Counseled on benefits of consistent maintenance inhaler use o Reviewed appropriate use rescue inhaler o Mailed application for Advair PAP; Patient is aware of $600 OOP spend on medication as program requirement. o Recommended to continue current medication for Asthma  BPH (Goal: Minimize symptoms) . Controlled . Current treatment  o Tamsulosin 0.44m46maily . Medications previously tried: none noted . Last PSA 11/23/2019 = WNL at 1.7 . Recommended to continue current  medication   Patient Goals/Self-Care Activities . Over the next 90 days, patient will:  o take medications as prescribed o focus on medication adherence by using compliance aides like weekly pill box o weigh daily, and contact provider if weight gain of 3 pounds in one day or 5 pounds in a week o collaborate with provider on medication access solutions o Complete PAP forms and return to office. Will coordinate with MelChristia ReadingP for AdvSanta Clarad [facility]kins [facility] approved.   Follow Up Plan: The care management team will reach out to the patient again over the next 90 days.       Medication Assistance: Mailed application for Advaid and EntDelene Lollill coordinate with Dr Hocherin's office regarding restarting EntDelene Loll patient is approved for PAP)  [facility] [place] [id number]: [phone]: [phone] pill box? Yes Pt endorses 98% compliance  Follow Up:  Patient agrees to Care Plan and Follow-up.  Plan: Telephone follow up appointment with care management team member scheduled for:  3 months  TamCherre RobinsharmD Clinical Pharmacist [facility]

## 2020-07-28 NOTE — Patient Instructions (Addendum)
Visit Information Brandon [name],  It was a pleasure speaking with you today. Please feel free to contact me if you have any questions or concerns. Below is information regarding you health goals and heart failure. I also included the 2 patient assistance applications we discussed. Once completed please return to me at [facility].  Keep up the good work!  Henrene Pastor, PharmD Clinical Pharmacist [facility] [phone]  PATIENT GOALS: Goals Addressed            This Visit's Progress   . Chronic Care Management Pharmacy Care Plan       CARE PLAN ENTRY (see longitudinal plan of care for additional care plan information)  Current Barriers:  . Chronic Disease Management support, education, and care coordination needs related to Hypertension, Pre-Diabetes, Heart Failure, Asthma, BPH, Rheumatoid Arthritis    Hypertension BP Readings from Last 3 Encounters:  01/24/20 (!) 146/88  11/23/19 124/76  10/18/19 125/74   . Pharmacist Clinical Goal(s): o Over the next 90 days, patient will work with PharmD and providers to maintain BP goal <140/90 . Current regimen:  . Amlodipine 10mg  daily  . Metoprolol Succinate 25mg  daily . Losartan 50mg  daily  . Spironolactone 25mg  daily . Interventions: o Discussed BP goal . Patient self care activities - Over the next 90 days, patient will: o Maintain hypertension medication regimen.  o Continue to check Blood pressure 2 to 3 times per week, record and bring to future appoitments.  Pre-Diabetes Lab Results  Component Value Date/Time   HGBA1C 6.3 11/07/2018 11:59 AM   HGBA1C 6.0 12/20/2017 01:29 PM   . Pharmacist Clinical Goal(s): o Over the next 90 days, patient will work with PharmD and providers to maintain A1c goal <6.5% . Current regimen:  o Diet and exercise management   . Interventions: o Discussed diet . Patient self care activities - Over the next 90 days, patient will: o Maintain a1c  less than 6.5%  Asthma . Pharmacist Clinical Goal(s) o Over the next 90 days, patient will work with PharmD and providers to reduce symptoms associated with Asthma and reduce barriers to medication . Current regimen:   Advair 500/35mcg 1 puff twice daily (maintenance inhaler)   Montelukast 10mg  daily   ProAir 2 puffs every 6 hours as needed (rescue inhaler)  . Interventions: o Mailed patient assistance for Advair o Reminded to rinse mouth after each Advair use o Reviewed maintenance versus rescue inhalers . Patient self care activities - Over the next 90 days, patient will: o Complete paperwork for Advair patient assistance o Continue current medications for astham  Heart Failure . Pharmacist Clinical Goal(s) o Over the next 90 days, patient will work with PharmD and providers to reduce symptoms associated with heart failure . Current regimen:   Furosemide 40mg  daily as needed  Metoprolol Succinate 25mg  daily  Spironolactone 25mg  daily  Losartan 50mg  gdaily . Interventions: o Collaboration with provider regarding medication management  o Mailed patient assistance application for [name]. Will coordinate with Dr Kirtland Bouchard about restarting Entresto and regarding PAP application. . Patient self care activities - Over the next 90 days, patient will: o  Maintain heart failure medication regimen o Complete patient assistance application and return to office o Continue to weight daily in morning; use furosemide as needed for weight gain of more than 3 pounds in 24 hours or 5 lbs in 1 week; also notify providers of you have weight gain or symptoms of worsening heart failure (swelling,  fluid retention, shortness of breath (especially when lying down), cough  Health Maintenance  . Pharmacist Clinical Goal(s) o Over the next 90 days, patient will work with PharmD and providers to complete health maintenance screenings/vaccinations . Interventions: o Discussed Shingrix vaccine  series o Recommend COVID booster before Fall . Patient self care activities - Over the next 90 days, patient will: o Complete Shingrix vaccine series  o Receive COVID booster  Medication management . Pharmacist Clinical Goal(s): o Over the next 90 days, patient will work with PharmD and providers to maintain optimal medication adherence . Current pharmacy: [facility] . Interventions o Comprehensive medication review performed. o Continue current medication management strategy . Patient self care activities - Over the next 90 days, patient will: o Focus on medication adherence by filling and taking medications appropriately  o Take medications as prescribed o Report any questions or concerns to PharmD and/or provider(s)  Please see past updates related to this goal by clicking on the "Past Updates" button in the selected goal        The patient verbalized understanding of instructions, educational materials, and care plan provided today and agreed to receive a mailed copy of patient instructions, educational materials, and care plan.   Telephone follow up appointment with care management team member scheduled for: 3 months    Heart Failure Action Plan A heart failure action plan helps you understand what to do when you have symptoms of heart failure. Your action plan is a color-coded plan that lists the symptoms to watch for and indicates what actions to take.  If you have symptoms in the red zone, you need medical care right away.  If you have symptoms in the yellow zone, you are having problems.  If you have symptoms in the green zone, you are doing well. Follow the plan that was created by you and your health care provider. Review your plan each time you visit your health care provider. Red zone These signs and symptoms mean you should get medical help right away:  You have trouble breathing when resting.  You have a dry cough that is getting worse.  You have swelling or  pain in your legs or abdomen that is getting worse.  You suddenly gain more than 2-3 lb (0.9-1.4 kg) in 24 hours, or more than 5 lb (2.3 kg) in a week. This amount may be more or less depending on your condition.  You have trouble staying awake or you feel confused.  You have chest pain.  You do not have an appetite.  You pass out.  You have worsening sadness or depression. If you have any of these symptoms, call your local emergency services (911 in the [place] right away. Do not drive yourself to the hospital.   Yellow zone These signs and symptoms mean your condition may be getting worse and you should make some changes:  You have trouble breathing when you are active, or you need to sleep with your head raised on extra pillows to help you breathe.  You have swelling in your legs or abdomen.  You gain 2-3 lb (0.9-1.4 kg) in 24 hours, or 5 lb (2.3 kg) in a week. This amount may be more or less depending on your condition.  You get tired easily.  You have trouble sleeping.  You have a dry cough. If you have any of these symptoms:  Contact your health care provider within the next day.  Your health care provider may adjust your medicines.  Green zone These signs mean you are doing well and can continue what you are doing:  You do not have shortness of breath.  You have very little swelling or no new swelling.  Your weight is stable (no gain or loss).  You have a normal activity level.  You do not have chest pain or any other new symptoms.   Follow these instructions at home:  Take over-the-counter and prescription medicines only as told by your health care provider.  Weigh yourself daily.  ? Call your health care provider if you gain more than 3 lbs in 24 hours, or more than 5 lbs in a week. ? Health care provider name: Dr Kirtland Bouchard or [name], NP ? Health care provider phone number: [phone] (cardiology) or  [phone] (primary care)  Eat a  heart-healthy diet. Work with a diet and nutrition specialist (dietitian) to create an eating plan that is best for you.  Keep all follow-up visits. This is important. Summary  A heart failure action plan helps you understand what to do when you have symptoms of heart failure.  Follow the action plan that was created by you and your health care provider.  Get help right away if you have any symptoms in the red zone. This information is not intended to replace advice given to you by your health care provider. Make sure you discuss any questions you have with your health care provider. Document Revised: 10/15/2019 Document Reviewed: 10/15/2019 Elsevier Patient Education  2021 [employer].

## 2020-08-08 ENCOUNTER — Other Ambulatory Visit: Payer: Self-pay

## 2020-08-08 MED ORDER — TAMSULOSIN HCL 0.4 MG PO CAPS: 0.4000 mg | ORAL_CAPSULE | Freq: Every day | ORAL | 0 refills | Status: DC

## 2020-08-12 ENCOUNTER — Ambulatory Visit (INDEPENDENT_AMBULATORY_CARE_PROVIDER_SITE_OTHER): Payer: Medicare Other | Admitting: Family

## 2020-08-12 ENCOUNTER — Other Ambulatory Visit: Payer: Self-pay

## 2020-08-12 VITALS — BP 120/80 | HR 65 | Temp 99.8°F | Resp 16 | Ht 71.0 in | Wt 237.0 lb

## 2020-08-12 DIAGNOSIS — I10 Essential (primary) hypertension: Secondary | ICD-10-CM

## 2020-08-12 DIAGNOSIS — E538 Deficiency of other specified B group vitamins: Secondary | ICD-10-CM | POA: Diagnosis not present

## 2020-08-12 DIAGNOSIS — Z13220 Encounter for screening for lipoid disorders: Secondary | ICD-10-CM | POA: Diagnosis not present

## 2020-08-12 DIAGNOSIS — M069 Rheumatoid arthritis, unspecified: Secondary | ICD-10-CM

## 2020-08-12 DIAGNOSIS — E663 Overweight: Secondary | ICD-10-CM | POA: Diagnosis not present

## 2020-08-12 DIAGNOSIS — N401 Enlarged prostate with lower urinary tract symptoms: Secondary | ICD-10-CM

## 2020-08-12 DIAGNOSIS — N400 Enlarged prostate without lower urinary tract symptoms: Secondary | ICD-10-CM | POA: Insufficient documentation

## 2020-08-12 DIAGNOSIS — N1831 Chronic kidney disease, stage 3a: Secondary | ICD-10-CM | POA: Diagnosis not present

## 2020-08-12 DIAGNOSIS — J45909 Unspecified asthma, uncomplicated: Secondary | ICD-10-CM | POA: Diagnosis not present

## 2020-08-12 MED ORDER — AMLODIPINE BESYLATE 10 MG PO TABS: 1.0000 | ORAL_TABLET | Freq: Every day | ORAL | 0 refills | Status: DC

## 2020-08-12 MED ORDER — MONTELUKAST SODIUM 10 MG PO TABS: 1.0000 | ORAL_TABLET | Freq: Every day | ORAL | 0 refills | Status: DC

## 2020-08-12 MED ORDER — SHINGRIX 50 MCG/0.5ML IM SUSR: INTRAMUSCULAR | 1 refills | Status: DC

## 2020-08-12 MED ORDER — SPIRONOLACTONE 25 MG PO TABS: 25.0000 mg | ORAL_TABLET | Freq: Every day | ORAL | 1 refills | Status: DC

## 2020-08-12 MED ORDER — METOPROLOL SUCCINATE ER 25 MG PO TB24: 1.0000 | ORAL_TABLET | Freq: Every day | ORAL | 0 refills | Status: DC

## 2020-08-12 MED ORDER — TAMSULOSIN HCL 0.4 MG PO CAPS: 0.4000 mg | ORAL_CAPSULE | Freq: Every day | ORAL | 1 refills | Status: DC

## 2020-08-12 NOTE — Assessment & Plan Note (Signed)
bp is stable. Continue losartan 50mg , toprol-xl 25mg , aldactone 25mg . Check follow up cmet.

## 2020-08-12 NOTE — Assessment & Plan Note (Signed)
Stable on Arava, followed by rheumatology.

## 2020-08-12 NOTE — Assessment & Plan Note (Signed)
Stable on flomax 0.4mg  once daily. Continue same.

## 2020-08-12 NOTE — Assessment & Plan Note (Signed)
Will check follow up b12, folate, continue oral b complex.

## 2020-08-12 NOTE — Assessment & Plan Note (Signed)
Check renal function. 

## 2020-08-12 NOTE — Progress Notes (Signed)
Subjective:    Patient ID: Brandon [name], male    DOB: 1940-04-11, 80 y.o.   MRN: [id number]  HPI  Patient is a 80 yr old male who presents today for follow up.   HTN-   BP Readings from Last 3 Encounters:  08/12/20 120/80  01/24/20 (!) 146/88  11/23/19 124/76   BPH-  Lab Results  Component Value Date   PSA 1.7 11/23/2019   PSA 3.40 01/30/2019   PSA 1.34 08/13/2009    Continues flomax. Reports symptoms are controlled.  Asthma-Reports well controlled. Uses proair.    RA- reports stable. Following with Rheumatology.   Had 4th covid shot. Did not bring card today.   Notes some tingling in his feet sometimes .Reports + compliance with b complex.  Review of Systems   See HPI  Past Medical History:  Diagnosis Date  . Anemia   . Asthma    Hx of childhood asthma, disappeared for a while, then resurfaced 6-7 years ago.   . Cardiomyopathy    with a negative cardiac catheterization in the past. (EF appriximately 40-45%)   . Heme positive stool 02/01/2019  . HTN (hypertension)    x 30 years  . Obesity, unspecified   . Pneumonia   . Sleep apnea    CPAP  . 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 Marital status: Married    Spouse name: [name]  . Number of children: 3  . Years of education: Not on file  . Highest education level: Not on file  Occupational History  . Occupation: retired  Tobacco Use  . Smoking status: Former Games developer  . Smokeless tobacco: Never Used  . Tobacco comment: quit smoling in Jun 29, 1988. started when 18, 1 ppd  Vaping Use  . Vaping Use: Never used  Substance and Sexual Activity  . Alcohol use: Yes    Comment: occasionaly   . Drug use: Never  . Sexual activity: Not on file  Other Topics Concern  . Not on file  Social History Narrative   Grew up in [place], attended [employer]. First wife died of breast ca in 06-29-97. 3 children. Remarried- 8 years. 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    Pt signed designated party release granting access to [facility] to his wife [name]. Detailed message may be left on home or cell phone. Roselle Locus August 13, 2009 11:34 am.    Social Determinants of Health   Financial Resource Strain: Low Risk   . Difficulty of Paying Living Expenses: Not very hard  Food Insecurity: Not on file  Transportation Needs: Not on file  Physical Activity: Insufficiently Active  . Days of Exercise per Week: 5 days  . Minutes of Exercise per Session: 10 min  Stress: Not on file  Social Connections: Not on file  Intimate Partner Violence: Not on file    Past Surgical History:  Procedure Laterality Date  . None      Family History  Problem Relation Age of Onset  . Cancer Father        multiple melanoma  . Lupus Sister        died at 63  . Diabetes Mellitus II Sister        died from covid-19  . Asthma Daughter   . Neuropathy Sister   . Colon cancer Neg Hx   . Esophageal cancer Neg Hx  Allergies  Allergen Reactions  . Ace Inhibitors Cough  . Isosorb Dinitrate-Hydralazine Other (See Comments)    REACTION: dizziness/hypotensive    Current Outpatient Medications on File Prior to Visit  Medication Sig Dispense Refill  . aspirin 81 MG tablet Take 81 mg by mouth daily.    Marland Kitchen b complex vitamins tablet Take 1 tablet by mouth daily.    . Fluticasone-Salmeterol (ADVAIR) 500-50 MCG/DOSE AEPB Inhale 1 puff into the lungs in the morning and at bedtime. 180 each 4  . furosemide (LASIX) 40 MG tablet Take 1 tablet (40 mg total) by mouth daily. 90 tablet 1  . leflunomide (ARAVA) 20 MG tablet Take 20 mg by mouth daily.    Marland Kitchen losartan (COZAAR) 50 MG tablet Take 1 tablet (50 mg total) by mouth daily. 90 tablet 1  . PROAIR HFA 108 (90 Base) MCG/ACT inhaler INHALE 2 PUFFS BY MOUTH EVERY 6 HOURS AS NEEDED FOR WHEEZING AND SHORTNESS OF BREATH 9 g 5   No current facility-administered medications on file prior to visit.     BP 120/80 (BP Location: Right Arm, Patient Position: Sitting, Cuff Size: Large)   Pulse 65   Temp 99.8 F (37.7 C) (Oral)   Resp 16   Ht 5\' 11"  (1.803 m)   Wt 237 lb (107.5 kg)   SpO2 99%   BMI 33.05 kg/m        Objective:   Physical Exam Constitutional:      General: He is not in acute distress.    Appearance: He is well-developed.  HENT:     Head: Normocephalic and atraumatic.  Cardiovascular:     Rate and Rhythm: Normal rate and regular rhythm.     Heart sounds: No murmur heard.   Pulmonary:     Effort: Pulmonary effort is normal. No respiratory distress.     Breath sounds: Normal breath sounds. No wheezing or rales.  Musculoskeletal:     Right lower leg: 1+ Edema present.     Left lower leg: 1+ Edema present.  Skin:    General: Skin is warm and dry.  Neurological:     Mental Status: He is alert and oriented to person, place, and time.  Psychiatric:        Behavior: Behavior normal.        Thought Content: Thought content normal.           Assessment & Plan:

## 2020-08-12 NOTE — Patient Instructions (Signed)
Please complete lab work prior to leaving.   

## 2020-08-12 NOTE — Assessment & Plan Note (Signed)
Stable on singulair 10mg , advair 500mg  and prn proair, continue same.

## 2020-08-13 ENCOUNTER — Telehealth: Payer: Self-pay | Admitting: Family

## 2020-08-13 DIAGNOSIS — E538 Deficiency of other specified B group vitamins: Secondary | ICD-10-CM | POA: Insufficient documentation

## 2020-08-13 LAB — COMPREHENSIVE METABOLIC PANEL
ALT: 12 U/L (ref 0–53)
AST: 14 U/L (ref 0–37)
Albumin: 3.9 g/dL (ref 3.5–5.2)
Alkaline Phosphatase: 75 U/L (ref 39–117)
BUN: 18 mg/dL (ref 6–23)
CO2: 24 mEq/L (ref 19–32)
Calcium: 9.3 mg/dL (ref 8.4–10.5)
Chloride: 107 mEq/L (ref 96–112)
Creatinine, Ser: 1.49 mg/dL (ref 0.40–1.50)
GFR: 44.32 mL/min — ABNORMAL LOW (ref >60.00)
Glucose, Bld: 76 mg/dL (ref 70–99)
Potassium: 4.6 mEq/L (ref 3.5–5.1)
Sodium: 138 mEq/L (ref 135–145)
Total Bilirubin: 0.8 mg/dL (ref 0.2–1.2)
Total Protein: 6.7 g/dL (ref 6.0–8.3)

## 2020-08-13 LAB — LIPID PANEL
Cholesterol: 151 mg/dL (ref 0–200)
HDL: 42.3 mg/dL (ref >39.00)
LDL Cholesterol: 98 mg/dL (ref 0–99)
NonHDL: 108.68
Total CHOL/HDL Ratio: 4
Triglycerides: 51 mg/dL (ref 0.0–149.0)
VLDL: 10.2 mg/dL (ref 0.0–40.0)

## 2020-08-13 LAB — B12 AND FOLATE PANEL
Folate: 10.3 ng/mL (ref >5.9)
Vitamin B-12: 302 pg/mL (ref 211–911)

## 2020-08-13 NOTE — Telephone Encounter (Signed)
Please advise pt that his folate level looks better, but b12 is low normal. This level could still be contributing to his numbness.  I would recommend that we start b12 injections IM weekly for 4 weeks, then monthly and see if this helps his symptoms.

## 2020-08-14 NOTE — Telephone Encounter (Signed)
Patient advised of results and scheduled to come in for his first B12 shot next Tuesday 08-19-20.

## 2020-08-15 ENCOUNTER — Telehealth: Payer: Self-pay | Admitting: Family

## 2020-08-15 DIAGNOSIS — R195 Other fecal abnormalities: Secondary | ICD-10-CM

## 2020-08-15 NOTE — Telephone Encounter (Signed)
Patient notified

## 2020-08-15 NOTE — Telephone Encounter (Signed)
Please contact pt and let him know that I reviewed his chart and it looks like he needs to follow back up with GI to further review his history of blood in the stool. I have placed the referral.

## 2020-08-19 ENCOUNTER — Other Ambulatory Visit: Payer: Self-pay

## 2020-08-19 ENCOUNTER — Ambulatory Visit (INDEPENDENT_AMBULATORY_CARE_PROVIDER_SITE_OTHER): Payer: Medicare Other | Admitting: *Deleted

## 2020-08-19 DIAGNOSIS — E538 Deficiency of other specified B group vitamins: Secondary | ICD-10-CM

## 2020-08-19 MED ORDER — CYANOCOBALAMIN 1000 MCG/ML IJ SOLN
1000.0000 ug | Freq: Once | INTRAMUSCULAR | Status: AC
Start: 2020-08-19 — End: 2020-08-19
  Administered 2020-08-19: 1000 ug via INTRAMUSCULAR

## 2020-08-19 NOTE — Progress Notes (Signed)
Brandon [name] is a 80 y.o. male presents to the office today for first of four weekly B12 injection, per physician's orders. Original order: b12 injections IM weekly for 4 weeks, then monthly cyanocobalamin, 1,047mcg/ml,  IM was administered left deltoid today. Patient tolerated injection. Patient due for follow up labs/provider appt: Yes. Date due: around 02/11/21, appt made Yes Patient next injection due: 08/26/20, appt made Yes  [name]

## 2020-08-26 ENCOUNTER — Ambulatory Visit (INDEPENDENT_AMBULATORY_CARE_PROVIDER_SITE_OTHER): Payer: Medicare Other

## 2020-08-26 ENCOUNTER — Other Ambulatory Visit: Payer: Self-pay

## 2020-08-26 DIAGNOSIS — E538 Deficiency of other specified B group vitamins: Secondary | ICD-10-CM

## 2020-08-26 MED ORDER — CYANOCOBALAMIN 1000 MCG/ML IJ SOLN
1000.0000 ug | Freq: Once | INTRAMUSCULAR | Status: AC
Start: 2020-08-26 — End: 2020-08-26
  Administered 2020-08-26: 1000 ug via INTRAMUSCULAR

## 2020-08-26 NOTE — Progress Notes (Signed)
Brandon [name] is a 80 y.o. male presents to the office today for b12 injection:weekly injections, per physician's orders. Original order: 08/13/2020 cyanocobalamin (med), 1,000 mcg (dose),  IM (route) was administered left deltoid (location) today. Patient tolerated injection.  Patient next injection due: next week, appt made Yes 09/02/2020  [name]

## 2020-09-02 ENCOUNTER — Ambulatory Visit (INDEPENDENT_AMBULATORY_CARE_PROVIDER_SITE_OTHER): Payer: Medicare Other

## 2020-09-02 ENCOUNTER — Other Ambulatory Visit: Payer: Self-pay

## 2020-09-02 DIAGNOSIS — E538 Deficiency of other specified B group vitamins: Secondary | ICD-10-CM | POA: Diagnosis not present

## 2020-09-02 MED ORDER — CYANOCOBALAMIN 1000 MCG/ML IJ SOLN
1000.0000 ug | Freq: Once | INTRAMUSCULAR | Status: AC
  Administered 2020-09-02: 1000 ug via INTRAMUSCULAR

## 2020-09-02 NOTE — Progress Notes (Signed)
Brandon [name] is a 80 y.o. male presents to the office today for weekly b12 injfection This is his fourth weekly. Next injection monthly per physician's orders. Original order: 08/12/2020 cyanocobalamin (med), 1,000 mcg (dose),  IM (route) was administered left deltoid (location) today. Patient tolerated injection.  Patient next injection due: one month, appt made Yes  [name]

## 2020-09-03 DIAGNOSIS — M5136 Other intervertebral disc degeneration, lumbar region: Secondary | ICD-10-CM | POA: Diagnosis not present

## 2020-09-03 DIAGNOSIS — M25552 Pain in left hip: Secondary | ICD-10-CM | POA: Diagnosis not present

## 2020-09-03 DIAGNOSIS — N1832 Chronic kidney disease, stage 3b: Secondary | ICD-10-CM | POA: Diagnosis not present

## 2020-09-03 DIAGNOSIS — M0579 Rheumatoid arthritis with rheumatoid factor of multiple sites without organ or systems involvement: Secondary | ICD-10-CM | POA: Diagnosis not present

## 2020-09-03 DIAGNOSIS — R21 Rash and other nonspecific skin eruption: Secondary | ICD-10-CM | POA: Diagnosis not present

## 2020-09-03 DIAGNOSIS — Z79899 Other long term (current) drug therapy: Secondary | ICD-10-CM | POA: Diagnosis not present

## 2020-10-02 ENCOUNTER — Other Ambulatory Visit: Payer: Self-pay

## 2020-10-02 ENCOUNTER — Ambulatory Visit: Payer: Medicare Other

## 2020-10-02 ENCOUNTER — Ambulatory Visit (INDEPENDENT_AMBULATORY_CARE_PROVIDER_SITE_OTHER): Payer: Medicare Other

## 2020-10-02 DIAGNOSIS — E538 Deficiency of other specified B group vitamins: Secondary | ICD-10-CM | POA: Diagnosis not present

## 2020-10-02 MED ORDER — CYANOCOBALAMIN 1000 MCG/ML IJ SOLN
1000.0000 ug | Freq: Once | INTRAMUSCULAR | Status: AC
  Administered 2020-10-02: 1000 ug via INTRAMUSCULAR

## 2020-10-02 NOTE — Progress Notes (Signed)
Brandon [name] is a 80 y.o. male presents to the office today for b12 infection: per physician's orders.  Cyanocobalamin (med), 1,000 mcg (dose),  IM (route) was administered left deltoid (location) today. Patient tolerated injection.  Patient next injection due: next month, appt made Yes  [name]

## 2020-10-20 ENCOUNTER — Telehealth: Payer: Medicare Other

## 2020-10-20 DIAGNOSIS — M1612 Unilateral primary osteoarthritis, left hip: Secondary | ICD-10-CM | POA: Diagnosis not present

## 2020-10-20 DIAGNOSIS — M545 Low back pain, unspecified: Secondary | ICD-10-CM | POA: Diagnosis not present

## 2020-11-01 DIAGNOSIS — K047 Periapical abscess without sinus: Secondary | ICD-10-CM | POA: Diagnosis not present

## 2020-11-04 ENCOUNTER — Ambulatory Visit: Payer: Medicare Other

## 2020-11-06 ENCOUNTER — Ambulatory Visit (INDEPENDENT_AMBULATORY_CARE_PROVIDER_SITE_OTHER): Payer: Medicare Other

## 2020-11-06 ENCOUNTER — Other Ambulatory Visit: Payer: Self-pay

## 2020-11-06 DIAGNOSIS — E538 Deficiency of other specified B group vitamins: Secondary | ICD-10-CM

## 2020-11-06 MED ORDER — CYANOCOBALAMIN 1000 MCG/ML IJ SOLN
1000.0000 ug | Freq: Once | INTRAMUSCULAR | Status: AC
  Administered 2020-11-06: 1000 ug via INTRAMUSCULAR

## 2020-11-06 NOTE — Progress Notes (Signed)
Brandon [name] is a 80 y.o. male presents to the office today for B12:  injections, per physician's orders. Original order: Per [name] FNP. Cyanocobalamin (med), 1000 mcg/ml (dose),  IM (route) was administered left deltoid (location) today. Patient tolerated injection. Patient due for follow up labs/provider appt: Yes. Date due: scheduled to see provider 02-11-21. Patient next injection due: in one month, appt made for 12-09-20.  Wilford Corner

## 2020-11-14 ENCOUNTER — Ambulatory Visit (INDEPENDENT_AMBULATORY_CARE_PROVIDER_SITE_OTHER): Payer: Medicare Other | Admitting: Pharmacist

## 2020-11-14 DIAGNOSIS — I10 Essential (primary) hypertension: Secondary | ICD-10-CM

## 2020-11-14 DIAGNOSIS — J45909 Unspecified asthma, uncomplicated: Secondary | ICD-10-CM | POA: Diagnosis not present

## 2020-11-14 DIAGNOSIS — M069 Rheumatoid arthritis, unspecified: Secondary | ICD-10-CM

## 2020-11-14 DIAGNOSIS — I5022 Chronic systolic (congestive) heart failure: Secondary | ICD-10-CM

## 2020-11-14 DIAGNOSIS — K047 Periapical abscess without sinus: Secondary | ICD-10-CM

## 2020-11-14 NOTE — Patient Instructions (Signed)
Mr. [place],   It was a pleasure speaking with you today.  I have attached a summary of our visit today and information about your health goals.  If you have any questions or concerns, please feel free to contact me either at the phone number below or with a MyChart message.   Keep up the good work!  Henrene Pastor, PharmD Clinical Pharmacist [facility] [phone] (direct line)  [phone] (main office number)   PATIENT GOALS:  Goals Addressed             This Visit's Progress    Chronic Care Management Pharmacy Care Plan   On track    CARE PLAN ENTRY (see longitudinal plan of care for additional care plan information)  Current Barriers:  Chronic Disease Management support, education, and care coordination needs related to Hypertension, Pre-Diabetes, Heart Failure, Asthma, BPH, Rheumatoid Arthritis    Hypertension BP Readings from Last 3 Encounters:  08/12/20 120/80  01/24/20 (!) 146/88  11/23/19 124/76  Pharmacist Clinical Goal(s): Over the next 90 days, patient will work with PharmD and providers to maintain BP goal <140/90 Current regimen:  Amlodipine 10mg  daily  Metoprolol Succinate 25mg  daily Losartan 50mg  daily  Spironolactone 25mg  daily Interventions: Discussed blood pressure goal Patient self care activities - Over the next 90 days, patient will: Maintain hypertension medication regimen.  Continue to check blood pressure 2 to 3 times per week, record and bring to future appoitments. Continue to limit daily salt intake goal < 2300 mg;  Pre-Diabetes Lab Results  Component Value Date/Time   HGBA1C 6.3 11/07/2018 11:59 AM   HGBA1C 6.0 12/20/2017 01:29 PM  Pharmacist Clinical Goal(s): Over the next 90 days, patient will work with PharmD and providers to maintain A1c goal <6.5% Current regimen:  Diet and exercise management   Interventions: Discussed diet Patient self care activities - Over the next 90 days, patient  will: Maintain A1c less than 6.5% Consider rechecking A1c with next labs  Asthma Pharmacist Clinical Goal(s) Over the next 90 days, patient will work with PharmD and providers to reduce symptoms associated with Asthma and reduce barriers to medication Current regimen:  Advair 500/57mcg 1 puff twice daily (maintenance inhaler)  Montelukast 10mg  daily  ProAir 2 puffs every 6 hours as needed (rescue inhaler)  Interventions: Reminded patient to return application for PAP for Advair once reaches $600 out of pocket for 2022. Reminded to rinse mouth after each Advair use Reviewed maintenance versus rescue inhalers Patient self care activities - Over the next 90 days, patient will: Complete paperwork for Advair patient assistance and return to clinical pharmacist at [facility] office Continue current medications for asthma  Heart Failure Pharmacist Clinical Goal(s) Over the next 90 days, patient will work with PharmD and providers to reduce symptoms associated with heart failure Current regimen:  Furosemide 40mg  daily as needed Metoprolol Succinate 25mg  daily Spironolactone 25mg  daily Losartan 50mg  gdaily Interventions: Collaboration with provider regarding medication management  Patient self care activities - Over the next 90 days, patient will:  Maintain heart failure medication regimen Continue to weight daily in morning; use furosemide as needed for weight gain of more than 3 pounds in 24 hours or 5 lbs in 1 week; also notify providers of you have weight gain or symptoms of worsening heart failure (swelling, fluid retention, shortness of breath (especially when lying down), cough  Rheumatoid Arthritis / Left hip pain: Current treatment:  Leflunomide 20mg  daily Steroid injection to left hip  by orthopedist.  Patient self care activities - Over the next 90 days, patient will: Continue current therapy for rheumatoid arthritis Continue regular follow up with Dr Dierdre Forth for  rheumatoid arthritis and with orthopedist at [facility] for hip pain  Health Maintenance  Pharmacist Clinical Goal(s) Over the next 90 days, patient will work with PharmD and providers to complete health maintenance screenings/vaccinations Interventions: Discussed Shingrix vaccine series Discussed annual flu vaccine Patient self care activities - Over the next 90 days, patient will: Complete Shingrix vaccine series  Complete annual flu vaccines  Medication management Pharmacist Clinical Goal(s): Over the next 90 days, patient will work with PharmD and providers to maintain optimal medication adherence Current pharmacy: [facility] Interventions Comprehensive medication review performed. Continue current medication management strategy Patient self care activities - Over the next 90 days, patient will: Focus on medication adherence by filling and taking medications appropriately  Take medications as prescribed Report any questions or concerns to PharmD and/or provider(s)  Please see past updates related to this goal by clicking on the "Past Updates" button in the selected goal          The patient verbalized understanding of instructions, educational materials, and care plan provided today and agreed to receive a mailed copy of patient instructions, educational materials, and care plan.   Telephone follow up appointment with care management team member scheduled for: 2 to 3 months

## 2020-11-14 NOTE — Chronic Care Management (AMB) (Signed)
Chronic Care Management Pharmacy Note  11/14/2020 Name:  Brandon [name] MRN:  [id number] DOB:  1941-01-20  Subjective: Brandon [name] is an 80 y.o. year old male who is a primary patient of Debbrah Alar, NP.  The CCM team was consulted for assistance with disease management and care coordination needs.    Engaged with patient by telephone for follow up visit in response to provider referral for pharmacy case management and/or care coordination services.   Consent to Services:  The patient was given information about Chronic Care Management services, agreed to services, and gave verbal consent prior to initiation of services.  Please see initial visit note for detailed documentation.   Patient Care Team: Debbrah Alar, NP as PCP - General (Internal Medicine) Minus Breeding, MD as PCP - Cardiology (Cardiology) Cherre Robins, PharmD (Pharmacist)  Recent office visits: 11/06/2020 - PCP - B12 injection 10/02/2020 - PCP - B12 injection 08/12/2020 - PCP [name], NP) F/U HTN. No ned medications. Recommended Shingrix vaccine.   Recent consult visits: 09/03/2020 - Rheumatology [name]) - unable to see records 06/09/2020: ophthalmology (Dr [facility]) post op exam following cataract procedure to left eye on 05/12/2020. Stopped prednisolone 12/25/19: Cardio phone message to stop entresto and start losartan 17m daily  Hospital visits: None in previous 6 months  Objective:  Lab Results  Component Value Date   CREATININE 1.49 08/12/2020   CREATININE 1.69 (H) 11/02/2019   CREATININE 1.61 (H) 06/15/2019    Lab Results  Component Value Date   HGBA1C 6.3 11/07/2018   Last diabetic Eye exam: No results found for: HMDIABEYEEXA  Last diabetic Foot exam: No results found for: HMDIABFOOTEX      Component Value Date/Time   CHOL 151 08/12/2020 1714   TRIG 51.0 08/12/2020 1714   HDL 42.30 08/12/2020 1714   CHOLHDL 4 08/12/2020 1714   VLDL 10.2 08/12/2020 1714    LDLCALC 98 08/12/2020 1714    Hepatic Function Latest Ref Rng & Units 08/12/2020 04/24/2019 04/12/2019  Total Protein 6.0 - 8.3 g/dL 6.7 7.0 6.2  Albumin 3.5 - 5.2 g/dL 3.9 3.5 -  AST 0 - 37 U/L 14 17 -  ALT 0 - 53 U/L 12 12 -  Alk Phosphatase 39 - 117 U/L 75 61 -  Total Bilirubin 0.2 - 1.2 mg/dL 0.8 0.7 -  Bilirubin, Direct 0.0 - 0.3 mg/dL - - -    Lab Results  Component Value Date/Time   TSH 1.06 01/12/2019 02:11 PM   TSH 1.84 12/27/2011 11:55 AM    CBC Latest Ref Rng & Units 04/24/2019 04/12/2019 01/12/2019  WBC 4.0 - 10.5 K/uL 10.0 8.3 7.6  Hemoglobin 13.0 - 17.0 g/dL 12.7(L) 12.8(L) 10.0(L)  Hematocrit 39.0 - 52.0 % 40.7 38.2(L) 30.3(L)  Platelets 150 - 400 K/uL 285 49(L) 437.0(H)    No results found for: VD25OH  Clinical ASCVD: No  The 10-year ASCVD risk score (Mikey BussingDC Jr., et al., 2013) is: 25.3%   Values used to calculate the score:     Age: [age]years     Sex: Male     Is Non-Hispanic African American: Yes     Diabetic: No     Tobacco smoker: No     Systolic Blood Pressure: 1915mmHg     Is BP treated: Yes     HDL Cholesterol: 42.3 mg/dL     Total Cholesterol: 151 mg/dL    Other: EF = 45-50% (01/02/2019)  Social History   Tobacco  Use  Smoking Status Former  Smokeless Tobacco Never  Tobacco Comments   quit smoling in 1990. started when 18, 1 ppd   BP Readings from Last 3 Encounters:  08/12/20 120/80  01/24/20 (!) 146/88  11/23/19 124/76   Pulse Readings from Last 3 Encounters:  08/12/20 65  01/24/20 67  11/23/19 91   Wt Readings from Last 3 Encounters:  08/12/20 237 lb (107.5 kg)  01/24/20 245 lb 6.4 oz (111.3 kg)  11/23/19 246 lb (111.6 kg)    Assessment: Review of patient past medical history, allergies, medications, health status, including review of consultants reports, laboratory and other test data, was performed as part of comprehensive evaluation and provision of chronic care management services.   SDOH:  (Social Determinants of Health)  assessments and interventions performed:  SDOH Interventions    Flowsheet Row Most Recent Value  SDOH Interventions   Financial Strain Interventions Other (Comment)  [patient will complete application for Advair PAP once reaches $600 out of pocket spend require (at $500 currently),  Referral to care coordination to check community resources for assistance with dental costs.]       CCM Care Plan  Allergies  Allergen Reactions   Ace Inhibitors Cough   Isosorb Dinitrate-Hydralazine Other (See Comments)    REACTION: dizziness/hypotensive    Medications Reviewed Today     Reviewed by Cherre Robins, PharmD (Pharmacist) on 11/14/20 at 1033  Med List Status: <None>   Medication Order Taking? Sig Documenting Provider Last Dose Status Informant  amLODipine (NORVASC) 10 MG tablet [id number] Yes Take 1 tablet (10 mg total) by mouth daily. Debbrah Alar, NP Taking Active   amoxicillin (AMOXIL) 500 MG capsule [id number] Yes Take 500 mg by mouth 3 (three) times daily. [provider] Taking Active   aspirin 81 MG tablet [id number] Yes Take 81 mg by mouth daily. [provider] Taking Active   b complex vitamins tablet [id number] Yes Take 1 tablet by mouth daily. Debbrah Alar, NP Taking Active   Fluticasone-Salmeterol (ADVAIR) 500-50 MCG/DOSE AEPB [id number] Yes Inhale 1 puff into the lungs in the morning and at bedtime. Debbrah Alar, NP Taking Active   furosemide (LASIX) 40 MG tablet [id number] Yes Take 1 tablet (40 mg total) by mouth daily.  Patient taking differently: Take 40 mg by mouth daily. As needed   Almyra Deforest, [place] Taking Active   leflunomide (ARAVA) 20 MG tablet [id number] Yes Take 20 mg by mouth daily. [provider] Taking Active   losartan (COZAAR) 50 MG tablet [id number] Yes Take 1 tablet (50 mg total) by mouth daily. Minus Breeding, MD Taking Active   metoprolol succinate (TOPROL-XL) 25 MG 24 hr tablet [id number] Yes Take 1 tablet (25 mg total)  by mouth daily. Debbrah Alar, NP Taking Active   montelukast (SINGULAIR) 10 MG tablet [id number] Yes Take 1 tablet (10 mg total) by mouth at bedtime. Debbrah Alar, NP Taking Active   PROAIR HFA 108 [phone] Base) MCG/ACT inhaler [id number] Yes INHALE 2 PUFFS BY MOUTH EVERY 6 HOURS AS NEEDED FOR WHEEZING AND SHORTNESS OF Monica Martinez, Lenna Sciara, NP Taking Active   spironolactone (ALDACTONE) 25 MG tablet [id number] Yes Take 1 tablet (25 mg total) by mouth daily. Debbrah Alar, NP Taking Active   tamsulosin [facility]) 0.4 MG CAPS capsule [id number] Yes Take 1 capsule (0.4 mg total) by mouth daily. Debbrah Alar, NP Taking Active   Zoster Vaccine Adjuvanted [facility]) injection [id number]  Inject 0.90m IM now and again in 2-6 months. ODebbrah Alar NP  Active             Patient Active Problem List   Diagnosis Date Noted   B12 deficiency 08/13/2020   Rheumatoid arthritis ([place]) 08/12/2020   BPH (benign prostatic hyperplasia) 31/59/4585   Folic acid deficiency 92/92/4462   Educated about COVID-19 virus infection 10/17/2019   Stage 3a chronic kidney disease ([place]) 10/17/2019   Leg swelling 10/17/2019   Heme positive stool 02/01/2019   RBBB 10/03/2018   Bilateral shoulder pain 08/24/2016   Depression 12/09/2015   Pain and swelling of knee 06/14/2014   Edema 12/06/2011   Asthma 08/13/2009   Hyperglycemia 08/13/2009   Obstructive sleep apnea 86/38/1771   CHRONIC SYSTOLIC HEART FAILURE 16/57/9038   OBESITY, UNSPECIFIED 08/26/2008   Disorder resulting from impaired renal function 08/26/2008   Essential hypertension 06/28/2008   Nonischemic cardiomyopathy ([place]) 06/28/2008    Immunization History  Administered Date(s) Administered   Fluad Quad(high Dose 65+) 11/21/2018   Influenza Split 02/24/2011, 12/03/2011   Influenza Whole 03/16/2007, 12/29/2009   Influenza, High Dose Seasonal PF 02/26/2015, 12/09/2015, 04/22/2017, 12/14/2017   Influenza,inj,Quad PF,6+ Mos  02/13/2013, 11/14/2013   Moderna Sars-Covid-2 Vaccination 04/08/2019, 05/06/2019, 11/27/2019, 07/02/2020   Pneumococcal Conjugate-13 11/14/2013   Pneumococcal Polysaccharide-23 08/13/2009   Td 07/26/2006   Tdap 09/22/2012, 05/23/2017   Zoster, Live 05/17/2011    Conditions to be addressed/monitored: CHF, HTN, DMII and asthma; rheumatoid arthritis; BPH; OSA; CKD-3  Care Plan : General Pharmacy (Adult)  Updates made by Cherre Robins, PHARMD since 11/14/2020 12:00 AM     Problem: HTN, HF, Asthma   Priority: High  Onset Date: 04/24/2020     Long-Range Goal: Patient-Specific Goal   Start Date: 04/24/2020  Expected End Date: 10/22/2020  Recent Progress: On track  Priority: High  Note:   Current Barriers:  Unable to independently afford treatment regimen Need for home monitoring of chronic conditions. Need for financial assistance with dental costs if available  Pharmacist Clinical Goal(s):  Over the next 90 days, patient will verbalize ability to afford treatment regimen achieve adherence to monitoring guidelines and medication adherence to achieve therapeutic efficacy through collaboration with PharmD and provider.  Refer to Care Management staff to assist in identifying community resources for dental cost / care assistance.   Interventions: 1:1 collaboration with Debbrah Alar, NP regarding development and update of comprehensive plan of care as evidenced by provider attestation and co-signature Inter-disciplinary care team collaboration (see longitudinal plan of care) Comprehensive medication review performed; medication list updated in electronic medical record  Hypertension (BP goal <140/90) Controlled Current treatment: Amlodipine 73m daily  Metoprolol Succinate 227mdaily Losartan 5045maily (switched from EntTwinsburge to cost) Spironolactone 65m98mily Medications previously tried: EntrShip brokerr CHF) -stopped due to cost Current home readings:  SBP: 120 to  135 DBP: 65 to 75 Denies dizziness or CP Patient has lost about 22lbs in the last 15 months (from 265 lbs 05/29/2020 to 233 lbs 09/03/2020) by limiting serving sizes and eating more vegetables Exercise: was participating in PT. Still doing some exercises from that and is active around his home a little.  Interventions:  Reminded to limit daily salt intake goal < 2300 mg; Recommended continue to check home blood pressure 2 to 3 times per week, record and provide log at future appointments Recommended to continue current medication for HTN  Heart Failure (Goal: control symptoms and prevent exacerbations) Controlled Type: HFmrEF NYHA Class: II (slight limitation of activity) Ejection fraction: 01/02/2019 45-50% Current treatment: Furosemide 40mg29mly as  needed for weight gain / edema Metoprolol Succinate 22m daily Spironolactone 279mdaily Losartan 5015maily Medications previously tried: EntDelene Lollstopped due to cost Patinet is checking weight daily at home Was given Entresto PAP in 2021 but patient did not complete. He thought the [facility] Hospital[facility] required him to spend $600 OOP and that he would not qualify. There is no OOP spend for the NovTime WarnerEntArboriculturistatient would like to apply this year if OK with Dr HocJenkins Rouge restart [facility] HospitalApplication was mailed to patient 07/2020 but he has not completed yet. Denies SOB; states he has occasional edema. Takes furosemide as needed.  Interventions:  Discussed signs and symptoms of CHF exacerbation - weight gain, SOB, abdominal fullness, swelling in legs or abdomen, Fatigue and weakness, changes in ability to perform usual activities, persistent cough or wheezing with white or pink blood-tinged mucus, nausea and lack of appetite Continue to weigh daily; if you gain more than 3 pounds in one day or 5 pounds in one week he is to take furosemide (per Dr HocJenkins Rougend should also contact providers for further instructions.   Recommended to continue current medication If he decides he would like to try to get Entresto from PAP, he has application and will discuss with Dr HocJenkins Rouge Asthma (Goal: control symptoms and prevent exacerbations) Controlled Current treatment: Advair 500/22m68m puff twice daily Montelukast 10mg56mly  ProAir 2 puffs every 6 hours as needed Medications previously tried: none noted  Exacerbations requiring treatment in last 6 months: none Patient reports consistent use of maintenance inhaler - uses twice a day, every day. Using rescue inhaler about 2 or 3 times per week. Usually when working outside in yard. This is less often than when we assess use in May 2022.  Interventions Reminded to rinse mouth after each Advair use.  Counseled on benefits of consistent maintenance inhaler use Reviewed appropriate use rescue inhaler Mailed application for Advair PAP; Patient is aware of $600 OOP spend on medication as program requirement. (He is at about $500) Recommended to continue current medications for Asthma  Rheumatoid Arthritis (Improve symptoms / pain while limiting medication side effects): Improved; patient states pain and stiffness has improved over last few months with current regimen and no longer needs to use OTC pain medications. [facility] Current treatment:  Leflunomide 20mg 59my Patient reports he was having increased left hip pain at [facility]. Not thought to be related to RA so [facility] 4 [facility] with much relief in hip pain. He has follow up with ortho in about 2 weeks.  Interventions:  Continue current therapy for RA Continue regular f/u with Dr BeekmaAmil Amen (Goal: Minimize symptoms) Controlled Current treatment  Tamsulosin 0.4mg da44m Medications previously tried: none noted Last PSA 11/23/2019  = WNL at 1.7 Patient reports nighttime urination - 0 to 1 time per night Denies urinary hesitancy or weak flow Interventions: Recommended to continue current medication  Poor dentition / Abscessed tooth:  Goal: resolve abscess and get better fitting denture / partials Patient reports he has had poor dentition for the last 2 to 3 years but has not been to dentist regularly due to COVID cTrentns.  He has lost bout 22lbs - some due to limiting portions sizes and some due to issues with chewing food. Patient is seeing dentist  and is currently taking amoxicillin 573m 3 times a day for abscessed tooth. After completed ABX therapy he will be evaluated further  He does mention that he is concerned with the cost of dental care and does not have dental coverage with this Medicare plan.  Interventions:  Encouraged to complete antibiotic course Referral sent to care coordination to see if there might be community resources to assist with dental care / costs.   Health Maintenance:  Reviewed immunization records Due to have Shingrix and yearly flu vaccine [facility] in [facility].  Interventions:  Discussed getting Shingrix with patient. He was not clear why he needed vaccine since he has Zostavax in past. Discussed effectivness of Shingrix is higher versus Zostavax and both are recommended to give the best coverage to prevent shingles.  Patient plans to get Shingrix in the next few weeks (cost per injection will be $45) Reminded patient to get annual flu vaccine  Patient Goals/Self-Care Activities Over the next 90 days, patient will:  take medications as prescribed focus on medication adherence by using compliance aides like weekly pill box weigh daily, and contact provider if weight gain of 3 pounds in one day or 5 pounds in a week collaborate with provider on medication access solutions Complete PAP forms and return to office. Will coordinate with MChristia Reading NP for AColumb[facility]and Dr [facility]kins [facility].  Complete Shingrix and annual flu vaccines  Follow Up Plan: The care management team will reach out to the patient again over the next 90 days.       Medication Assistance: Patient has application for Advair that was mailed at last CCM visit. We are just waiting for patient to spend $600 out of pocket on medications for 2022 which is a requirement for PAP.  [id number]: [phone]: [phone] Uses pill box? Yes Pt endorses 99% compliance  Follow Up:  Patient agrees to Care Plan and Follow-up.  Plan: Telephone follow up appointment with care management team member scheduled for:  3 months  TCherre Robins PharmD Clinical Pharmacist [facility]

## 2020-11-18 ENCOUNTER — Telehealth: Payer: Self-pay | Admitting: *Deleted

## 2020-11-18 NOTE — Telephone Encounter (Signed)
   Telephone encounter was:  Successful.  11/18/2020 Name: Brandon [name] MRN: [id number] DOB: [birth year]  Brandon [name] is a 80 y.o. year old male who is a primary care patient of [name], NP . The community resource team was consulted for assistance with Patient needing community resources for low cost or affordable dentistry sent him via email that information and let him know to reach out if I could be any more assistance   Care guide performed the following interventions: Patient provided with information about care guide support team and interviewed to confirm resource needs.  Follow Up Plan:  No further follow up planned at this time. The patient has been provided with needed resources. Alois Cliche -[facility] Guide , Embedded Care Coordination [facility], Care Management  [phone] [address] [place] , [place] [address] Email : [email or web address]

## 2020-11-20 DIAGNOSIS — M0579 Rheumatoid arthritis with rheumatoid factor of multiple sites without organ or systems involvement: Secondary | ICD-10-CM | POA: Diagnosis not present

## 2020-11-20 DIAGNOSIS — Z79899 Other long term (current) drug therapy: Secondary | ICD-10-CM | POA: Diagnosis not present

## 2020-11-28 ENCOUNTER — Other Ambulatory Visit: Payer: Self-pay | Admitting: Family

## 2020-12-09 ENCOUNTER — Other Ambulatory Visit: Payer: Self-pay

## 2020-12-09 ENCOUNTER — Ambulatory Visit (INDEPENDENT_AMBULATORY_CARE_PROVIDER_SITE_OTHER): Payer: Medicare Other

## 2020-12-09 DIAGNOSIS — E538 Deficiency of other specified B group vitamins: Secondary | ICD-10-CM

## 2020-12-09 MED ORDER — CYANOCOBALAMIN 1000 MCG/ML IJ SOLN
1000.0000 ug | Freq: Once | INTRAMUSCULAR | Status: AC
  Administered 2020-12-09: 1000 ug via INTRAMUSCULAR

## 2020-12-09 NOTE — Progress Notes (Signed)
Pt here today for B12 injection.   Cyanocobalamin 1 mL injected IM into L deltoid. Pt tolerated injection well.    Next in 1 month. Nurse visit scheduled 01/07/2021.

## 2020-12-10 DIAGNOSIS — M1612 Unilateral primary osteoarthritis, left hip: Secondary | ICD-10-CM | POA: Diagnosis not present

## 2020-12-23 ENCOUNTER — Other Ambulatory Visit: Payer: Self-pay | Admitting: Family

## 2021-01-06 ENCOUNTER — Telehealth: Payer: Self-pay

## 2021-01-06 NOTE — Chronic Care Management (AMB) (Signed)
    Chronic Care Management Pharmacy Assistant   Name: Brandon [name]  MRN: [id number] DOB: 23-Aug-1940  Per pharmacist request. I reached out to patient to ask if he has hit his $600 OOP for his Advair medication. Patient states that he is currently on his way to the pharmacy to ask for the OOP. He states that he thinks he may have hit the $600 but wants to make sure before he sends the patient assistance forms to his PCP. Patient will call CCM back for an update.   Salli Real, CMA

## 2021-01-07 ENCOUNTER — Other Ambulatory Visit: Payer: Self-pay

## 2021-01-07 ENCOUNTER — Ambulatory Visit (INDEPENDENT_AMBULATORY_CARE_PROVIDER_SITE_OTHER): Payer: Medicare Other

## 2021-01-07 DIAGNOSIS — E538 Deficiency of other specified B group vitamins: Secondary | ICD-10-CM

## 2021-01-07 MED ORDER — CYANOCOBALAMIN 1000 MCG/ML IJ SOLN
1000.0000 ug | INTRAMUSCULAR | Status: DC
  Administered 2021-01-07 – 2021-02-11 (×2): 1000 ug via INTRAMUSCULAR

## 2021-01-07 NOTE — Chronic Care Management (AMB) (Signed)
Patient called back and stated that he did reach his $600 out of pocket expense. He stated that he will drop it off in the office tomorrow.

## 2021-01-07 NOTE — Progress Notes (Signed)
Pt here for monthly B12 injection per [name]   B12 given IM, and pt tolerated injection well.  Next B12 injection scheduled for 11/29

## 2021-01-09 ENCOUNTER — Telehealth: Payer: Self-pay | Admitting: Family

## 2021-01-09 NOTE — Telephone Encounter (Signed)
Pt dropped off document for Tammy to fill out. Document put at front office tray under Pharmacist name.

## 2021-01-13 ENCOUNTER — Ambulatory Visit (INDEPENDENT_AMBULATORY_CARE_PROVIDER_SITE_OTHER): Payer: Medicare Other | Admitting: Pharmacist

## 2021-01-13 DIAGNOSIS — I5022 Chronic systolic (congestive) heart failure: Secondary | ICD-10-CM

## 2021-01-13 DIAGNOSIS — J45909 Unspecified asthma, uncomplicated: Secondary | ICD-10-CM

## 2021-01-13 NOTE — Patient Instructions (Signed)
CARE PLAN ENTRY (see longitudinal plan of care for additional care plan information)  Current Barriers:  Chronic Disease Management support, education, and care coordination needs related to Hypertension, Pre-Diabetes, Heart Failure, Asthma, BPH, Rheumatoid Arthritis    Hypertension BP Readings from Last 3 Encounters:  08/12/20 120/80  01/24/20 (!) 146/88  11/23/19 124/76   Pharmacist Clinical Goal(s): Over the next 90 days, patient will work with PharmD and providers to maintain BP goal <140/90 Current regimen:  Amlodipine 10mg  daily  Metoprolol Succinate 25mg  daily Losartan 50mg  daily  Spironolactone 25mg  daily Interventions: Discussed blood pressure goal Patient self care activities - Over the next 90 days, patient will: Maintain hypertension medication regimen.  Continue to check blood pressure 2 to 3 times per week, record and bring to future appoitments. Continue to limit daily salt intake goal < 2300 mg;  Pre-Diabetes Lab Results  Component Value Date/Time   HGBA1C 6.3 11/07/2018 11:59 AM   HGBA1C 6.0 12/20/2017 01:29 PM   Pharmacist Clinical Goal(s): Over the next 90 days, patient will work with PharmD and providers to maintain A1c goal <6.5% Current regimen:  Diet and exercise management   Interventions: Discussed diet Patient self care activities - Over the next 90 days, patient will: Maintain A1c less than 6.5% Consider rechecking A1c with next labs  Asthma Pharmacist Clinical Goal(s) Over the next 90 days, patient will work with PharmD and providers to reduce symptoms associated with Asthma and reduce barriers to medication Current regimen:  Advair 500/77mcg 1 puff twice daily (maintenance inhaler)  Montelukast 10mg  daily  ProAir 2 puffs every 6 hours as needed (rescue inhaler)  Interventions: Reviewed medication assistance program application for Adviar (needed to clarify income and household number)  Reminded to rinse mouth after each Advair  use Reviewed maintenance versus rescue inhalers Patient self care activities - Over the next 90 days, patient will: Continue current medications for asthma  Heart Failure Pharmacist Clinical Goal(s) Over the next 90 days, patient will work with PharmD and providers to reduce symptoms associated with heart failure Current regimen:  Furosemide 40mg  daily as needed Metoprolol Succinate 25mg  daily Spironolactone 25mg  daily Losartan 50mg  gdaily Interventions: Collaboration with provider regarding medication management  Patient self care activities - Over the next 90 days, patient will:  Maintain heart failure medication regimen Continue to weight daily in morning; use furosemide as needed for weight gain of more than 3 pounds in 24 hours or 5 lbs in 1 week; also notify providers of you have weight gain or symptoms of worsening heart failure (swelling, fluid retention, shortness of breath (especially when lying down), cough  Rheumatoid Arthritis / Left hip pain: Current treatment:  Leflunomide 20mg  daily Steroid injection to left hip by orthopedist.  Patient self care activities - Over the next 90 days, patient will: Continue current therapy for rheumatoid arthritis Continue regular follow up with Dr for rheumatoid arthritis and with orthopedist at 11/09/2018 for hip pain  Health Maintenance  Pharmacist Clinical Goal(s) Over the next 90 days, patient will work with PharmD and providers to complete health maintenance screenings/vaccinations Interventions: Discussed Shingrix vaccine series Discussed annual flu vaccine and COVID booster Patient self care activities - Over the next 90 days, patient will: Complete Shingrix vaccine series  Complete annual flu vaccine and COVID booster  Medication management Pharmacist Clinical Goal(s): Over the next 90 days, patient will work with PharmD and providers to maintain optimal medication adherence Current pharmacy:  [facility] Interventions Comprehensive medication review performed. Continue current medication  management strategy Patient self care activities - Over the next 90 days, patient will: Focus on medication adherence by filling and taking medications appropriately  Take medications as prescribed Report any questions or concerns to PharmD and/or provider(s)   The patient verbalized understanding of instructions, educational materials, and care plan provided today and declined offer to receive copy of patient instructions, educational materials, and care plan.   Telephone follow up appointment with care management team member scheduled for: 2 to 3 weeks  Henrene Pastor, PharmD Clinical Pharmacist [facility]

## 2021-01-13 NOTE — Telephone Encounter (Signed)
Documents received and reviewed. Will need additional information about patient's household number and income.  See phone visit for more information.

## 2021-01-13 NOTE — Chronic Care Management (AMB) (Signed)
Chronic Care Management Pharmacy Note  01/18/2021 Name:  Brandon [name] MRN:  [id number] DOB:  [birth year]  Subjective: Brandon [name] is an 80 y.o. year old male who is a primary patient of Debbrah Alar, NP.  The CCM team was consulted for assistance with disease management and care coordination needs.    Engaged with patient by telephone for follow up visit in response to provider referral for pharmacy case management and/or care coordination services.   Consent to Services:  The patient was given information about Chronic Care Management services, agreed to services, and gave verbal consent prior to initiation of services.  Please see initial visit note for detailed documentation.   Patient Care Team: Debbrah Alar, NP as PCP - General (Internal Medicine) Minus Breeding, MD as PCP - Cardiology (Cardiology) Cherre Robins, [place] (Pharmacist)  Recent office visits: 11/06/2020 - PCP - B12 injection 10/02/2020 - PCP - B12 injection 08/12/2020 - PCP Brandon Castle, NP) F/U HTN. No ned medications. Recommended Shingrix vaccine.   Recent consult visits: 09/03/2020 - Rheumatology [name]) - unable to see records 06/09/2020: ophthalmology (Dr Brandon [name] [facility]) post op exam following cataract procedure to left eye on 05/12/2020. Stopped prednisolone 12/25/19: Cardio phone message to stop entresto and start losartan 32m daily  Hospital visits: None in previous 6 months  Objective:  Lab Results  Component Value Date   CREATININE 1.49 08/12/2020   CREATININE 1.69 (H) 11/02/2019   CREATININE 1.61 (H) 06/15/2019    Lab Results  Component Value Date   HGBA1C 6.3 11/07/2018   Last diabetic Eye exam: No results found for: HMDIABEYEEXA  Last diabetic Foot exam: No results found for: HMDIABFOOTEX      Component Value Date/Time   CHOL 151 08/12/2020 1714   TRIG 51.0 08/12/2020 1714   HDL 42.30 08/12/2020 1714   CHOLHDL 4 08/12/2020 1714   VLDL 10.2 08/12/2020 1714    LDLCALC 98 08/12/2020 1714    Hepatic Function Latest Ref Rng & Units 08/12/2020 04/24/2019 04/12/2019  Total Protein 6.0 - 8.3 g/dL 6.7 7.0 6.2  Albumin 3.5 - 5.2 g/dL 3.9 3.5 -  AST 0 - 37 U/L 14 17 -  ALT 0 - 53 U/L 12 12 -  Alk Phosphatase 39 - 117 U/L 75 61 -  Total Bilirubin 0.2 - 1.2 mg/dL 0.8 0.7 -  Bilirubin, Direct 0.0 - 0.3 mg/dL - - -    Lab Results  Component Value Date/Time   TSH 1.06 01/12/2019 02:11 PM   TSH 1.84 12/27/2011 11:55 AM    CBC Latest Ref Rng & Units 04/24/2019 04/12/2019 01/12/2019  WBC 4.0 - 10.5 K/uL 10.0 8.3 7.6  Hemoglobin 13.0 - 17.0 g/dL 12.7(L) 12.8(L) 10.0(L)  Hematocrit 39.0 - 52.0 % 40.7 38.2(L) 30.3(L)  Platelets 150 - 400 K/uL 285 49(L) 437.0(H)    No results found for: VD25OH  Clinical ASCVD: No  The ASCVD Risk score (Arnett DK, et al., 2019) failed to calculate for the following reasons:   The 2019 ASCVD risk score is only valid for ages 465to 754   Other: EF = 45-50% (01/02/2019)  Social History   Tobacco Use  Smoking Status Former  Smokeless Tobacco Never  Tobacco Comments   quit smoling in 1990. started when 18, 1 ppd   BP Readings from Last 3 Encounters:  08/12/20 120/80  01/24/20 (!) 146/88  11/23/19 124/76   Pulse Readings from Last 3 Encounters:  08/12/20 65  01/24/20 67  11/23/19 91  Wt Readings from Last 3 Encounters:  08/12/20 237 lb (107.5 kg)  01/24/20 245 lb 6.4 oz (111.3 kg)  11/23/19 246 lb (111.6 kg)    Assessment: Review of patient past medical history, allergies, medications, health status, including review of consultants reports, laboratory and other test data, was performed as part of comprehensive evaluation and provision of chronic care management services.   SDOH:  (Social Determinants of Health) assessments and interventions performed:     CCM Care Plan  Allergies  Allergen Reactions   Ace Inhibitors Cough   Isosorb Dinitrate-Hydralazine Other (See Comments)    REACTION:  dizziness/hypotensive    Medications Reviewed Today     Reviewed by Cherre Robins, RPH-CPP (Pharmacist) on 01/18/21 at [place] List Status: <None>   Medication Order Taking? Sig Documenting Provider Last Dose Status Informant  amLODipine (NORVASC) 10 MG tablet [id number] Yes Take 1 tablet (10 mg total) by mouth daily. Debbrah Alar, NP Taking Active   amoxicillin (AMOXIL) 500 MG capsule [id number] Yes Take 500 mg by mouth 3 (three) times daily. [provider] Taking Active   aspirin 81 MG tablet [id number] Yes Take 81 mg by mouth daily. [provider] Taking Active   b complex vitamins tablet [id number] Yes Take 1 tablet by mouth daily. Debbrah Alar, NP Taking Active   cyanocobalamin ((VITAMIN B-12)) injection 1,000 mcg [id number]   Debbrah Alar, NP  Active   fluticasone-salmeterol (ADVAIR) 500-50 MCG/ACT AEPB [id number] Yes INHALE 1 DOSE BY MOUTH IN THE MORNING AND AT BEDTIME Debbrah Alar, NP Taking Active   furosemide (LASIX) 40 MG tablet [id number] Yes Take 1 tablet (40 mg total) by mouth daily.  Patient taking differently: Take 40 mg by mouth daily. As needed   Almyra Deforest, [place] Taking Active   leflunomide (ARAVA) 20 MG tablet [id number] Yes Take 20 mg by mouth daily. [provider] Taking Active   losartan (COZAAR) 50 MG tablet [id number] Yes Take 1 tablet (50 mg total) by mouth daily. Minus Breeding, MD Taking Active   metoprolol succinate (TOPROL-XL) 25 MG 24 hr tablet [id number] Yes Take 1 tablet by mouth once daily Debbrah Alar, NP Taking Active   montelukast (SINGULAIR) 10 MG tablet [id number] Yes TAKE 1 TABLET BY MOUTH AT BEDTIME Debbrah Alar, NP Taking Active   PROAIR HFA 108 (90 Base) MCG/ACT inhaler [id number]  INHALE 2 PUFFS BY MOUTH EVERY 6 HOURS AS NEEDED FOR WHEEZING AND SHORTNESS OF Monica Martinez, Lenna Sciara, NP  Active   spironolactone (ALDACTONE) 25 MG tablet [id number] Yes Take 1 tablet (25 mg total) by mouth daily.  Debbrah Alar, NP Taking Active   tamsulosin [facility]) 0.4 MG CAPS capsule [id number] Yes Take 1 capsule (0.4 mg total) by mouth daily. Debbrah Alar, NP Taking Active   Zoster Vaccine Adjuvanted [facility]) injection [id number] Yes Inject 0.39m IM now and again in 2-6 months. ODebbrah Alar NP Taking Active             Patient Active Problem List   Diagnosis Date Noted   B12 deficiency 08/13/2020   Rheumatoid arthritis ([place] 08/12/2020   BPH (benign prostatic hyperplasia) 019/41/7408  Folic acid deficiency 014/48/1856  Educated about COVID-19 virus infection 10/17/2019   Stage 3a chronic kidney disease ([place] 10/17/2019   Leg swelling 10/17/2019   Heme positive stool 02/01/2019   RBBB 10/03/2018   Bilateral shoulder pain 08/24/2016   Depression 12/09/2015   Pain and swelling of knee 06/14/2014   Edema 12/06/2011   Asthma 08/13/2009  Hyperglycemia 08/13/2009   Obstructive sleep apnea 69/80/6078   CHRONIC SYSTOLIC HEART FAILURE 95/03/1565   OBESITY, UNSPECIFIED 08/26/2008   Disorder resulting from impaired renal function 08/26/2008   Essential hypertension 06/28/2008   Nonischemic cardiomyopathy ([place]) 06/28/2008    Immunization History  Administered Date(s) Administered   Fluad Quad(high Dose 65+) 11/21/2018   Influenza Split 02/24/2011, 12/03/2011   Influenza Whole 03/16/2007, 12/29/2009   Influenza, High Dose Seasonal PF 02/26/2015, 12/09/2015, 04/22/2017, 12/14/2017   Influenza,inj,Quad PF,6+ Mos 02/13/2013, 11/14/2013   Moderna Sars-Covid-2 Vaccination 04/08/2019, 05/06/2019, 11/27/2019, 07/02/2020   Pneumococcal Conjugate-13 11/14/2013   Pneumococcal Polysaccharide-23 08/13/2009   Td 07/26/2006   Tdap 09/22/2012, 05/23/2017   Zoster, Live 05/17/2011    Conditions to be addressed/monitored: CHF, HTN, DMII and asthma; rheumatoid arthritis; BPH; OSA; CKD-3  There are no care plans that you recently modified to display for this  patient.   Medication Assistance: Patient has returned application for Advair with pharmacy report that indicated he has spent $600 OOP. Forwarding to PCP to sign and will then fax to medication assistance program.  Will check with cardiology about recommendations regarding restarting Entresto / applying for patient assistance.   Patient's preferred pharmacy is:  [place], [place] [address] Phone: [phone] Fax: [phone]  Uses pill box? Yes Pt endorses 99% compliance  Follow Up:  Patient agrees to Care Plan and Follow-up.  Plan: Telephone follow up appointment with care management team member scheduled for:  3 weeks to check medication assistance program application  Cherre Robins, PharmD Clinical Pharmacist [facility]

## 2021-01-15 ENCOUNTER — Telehealth: Payer: Self-pay | Admitting: Family

## 2021-01-15 NOTE — Telephone Encounter (Signed)
Informed pt that onsite triage nurse advised him to seek urgent care, since he is having trouble breathing, he stated he would keep it in mind.

## 2021-01-15 NOTE — Telephone Encounter (Signed)
Since triage note was not available at the time of pt call, Weldon Picking RN Clinical Supervisor was consulted and stated pt should be seen in the er for breathing concerns.

## 2021-01-15 NOTE — Telephone Encounter (Signed)
Pt stated he was trying to use his asthma machine but  albuterol (PROVENTIL) (2.5 MG/3ML) 0.083% nebulizer solution   expired 2019 and wanted a refill. Scheduled him for appt 11/8, but did transfer to triage since he was having shortness of breath. Please advise.

## 2021-01-16 ENCOUNTER — Other Ambulatory Visit: Payer: Self-pay

## 2021-01-16 MED ORDER — PROAIR HFA 108 (90 BASE) MCG/ACT IN AERS: INHALATION_SPRAY | RESPIRATORY_TRACT | 5 refills | Status: DC

## 2021-01-16 NOTE — Progress Notes (Signed)
Called patient  a few times but no answer, lvm for him to be aware proair refill sent to his pharmacy.   Patient advised if SOB to seek in person evaluation at ed.

## 2021-01-16 NOTE — Telephone Encounter (Signed)
Who Is Calling Patient / Member / Family / Caregiver Call Type Triage / Clinical Relationship To Patient Self Return Phone Number [phone] (Primary) Chief Complaint BREATHING - shortness of breath or sounds breathless Reason for Call Symptomatic / Request for Health Information Initial Comment Caller states Brandon [name] 1940-04-30 needs a refill on his asthma medicine. Has a rescue inhaler has some shortness of breath. Took 2 covid test and both are negative. Translation No Nurse Assessment Nurse: Izola Price, RN, Beth Date/Time (Eastern Time): 01/15/2021 3:17:32 PM Confirm and document reason for call. If symptomatic, describe symptoms. ---Caller needs a refill on his asthma medicine, has a rescue inhaler has some shortness of breath, took 2 covid test and both are negative.  01/15/2021 3:23:40 PM See PCP within 24 Hours Yes Izola Price, Charity fundraiser, [employer]

## 2021-01-20 ENCOUNTER — Other Ambulatory Visit: Payer: Self-pay

## 2021-01-20 ENCOUNTER — Telehealth: Payer: Self-pay | Admitting: Family

## 2021-01-20 ENCOUNTER — Ambulatory Visit (INDEPENDENT_AMBULATORY_CARE_PROVIDER_SITE_OTHER): Payer: Medicare Other | Admitting: Family

## 2021-01-20 VITALS — BP 119/60 | HR 84 | Temp 99.0°F | Resp 16 | Ht 71.0 in | Wt 238.0 lb

## 2021-01-20 DIAGNOSIS — M7989 Other specified soft tissue disorders: Secondary | ICD-10-CM | POA: Diagnosis not present

## 2021-01-20 DIAGNOSIS — J45909 Unspecified asthma, uncomplicated: Secondary | ICD-10-CM | POA: Diagnosis not present

## 2021-01-20 DIAGNOSIS — M069 Rheumatoid arthritis, unspecified: Secondary | ICD-10-CM

## 2021-01-20 DIAGNOSIS — E538 Deficiency of other specified B group vitamins: Secondary | ICD-10-CM | POA: Diagnosis not present

## 2021-01-20 DIAGNOSIS — R195 Other fecal abnormalities: Secondary | ICD-10-CM | POA: Diagnosis not present

## 2021-01-20 DIAGNOSIS — N1831 Chronic kidney disease, stage 3a: Secondary | ICD-10-CM | POA: Diagnosis not present

## 2021-01-20 DIAGNOSIS — I10 Essential (primary) hypertension: Secondary | ICD-10-CM

## 2021-01-20 DIAGNOSIS — N401 Enlarged prostate with lower urinary tract symptoms: Secondary | ICD-10-CM

## 2021-01-20 DIAGNOSIS — G4733 Obstructive sleep apnea (adult) (pediatric): Secondary | ICD-10-CM | POA: Diagnosis not present

## 2021-01-20 MED ORDER — ALBUTEROL SULFATE (2.5 MG/3ML) 0.083% IN NEBU: 2.5000 mg | INHALATION_SOLUTION | Freq: Four times a day (QID) | RESPIRATORY_TRACT | 1 refills | Status: DC | PRN

## 2021-01-20 MED ORDER — FUROSEMIDE 40 MG PO TABS: 40.0000 mg | ORAL_TABLET | Freq: Every day | ORAL | 1 refills | Status: DC | PRN

## 2021-01-20 NOTE — Assessment & Plan Note (Signed)
Stable on CPAP 

## 2021-01-20 NOTE — Assessment & Plan Note (Signed)
Stable.  Monitor.  Continue lasix as needed.

## 2021-01-20 NOTE — Assessment & Plan Note (Signed)
Stable on flomax, continue same.  

## 2021-01-20 NOTE — Assessment & Plan Note (Signed)
Maintained on Arava per rheumatology. Has follow up next month. Reports symptoms satble.

## 2021-01-20 NOTE — Assessment & Plan Note (Signed)
Continue b12 injections monthly.  

## 2021-01-20 NOTE — Assessment & Plan Note (Signed)
BP Readings from Last 3 Encounters:  01/20/21 119/60  08/12/20 120/80  01/24/20 (!) 146/88   BP stable. Continue toprol xl,

## 2021-01-20 NOTE — Assessment & Plan Note (Signed)
He did not follow through with Endo/Colo. Will reach out to his GI dr.

## 2021-01-20 NOTE — Telephone Encounter (Signed)
Hi Dr. Chales Abrahams.  I saw this patient today and saw that he never followed back up to discuss colo/endo for heme + stool. I think he misunderstood the reason for the testing.  Do you still wish for him to do these studies?  Tks

## 2021-01-20 NOTE — Patient Instructions (Signed)
Please complete lab work prior to leaving.   

## 2021-01-20 NOTE — Assessment & Plan Note (Signed)
Exam is normal. Continue advair 500mg , singulair 10mg  and albuterol MDI and nebs.  I do not see any need for oral steroids at this time.

## 2021-01-20 NOTE — Progress Notes (Signed)
Subjective:   By signing my name below, I, Brandon [name], attest that this documentation has been prepared under the direction and in the presence of Brandon Craze, NP, 01/20/2021   Patient ID: Brandon [name], male    DOB: [birth year], 80 y.o.   MRN: [id number]  Chief Complaint  Patient presents with   Asthma    Here for follow up, "needs Albuterol sulfate rx"    HPI Patient is in today for an office visit.  Asthma: He notes that with the seasons he has a harder time breathing clearly. This season of the year it is normally very bad and he can feel it starting to get worse as of this visit. He mentions he has noticed wheezing but this is not abnormal for him. He notes that certain activity has an affect on how much he needs to use his Proair rescue inhaler. He mentions things such as walking up and down his steep drive way and going up and downstairs triggers him to needs his Proair rescue inhaler. He notes in the last week he was needing to taking his Proair rescue inhaler 2-3 times per week. He takes his Advair inhaler daily as well. He is requesting to have albuterol prescribed for his nebulizer.  Sleeping: He has been using his C pap machine to sleep at night. He notes this is working well for him and notes he needs a new mask for the machine but his regular store does not carry them anymore so he is trying to figure out how to order it. He mentions that he has not had a sleep study conducted in years.  Rheumatologist: He sees his rheumatologist for his rheumatoid arthritis and notes no areas of concern with this.  Swelling: He mentions that he has swelling in his legs and has been using 40 mg lasix to help reduce this. He also notes that he is having to use it more frequently recently due to an increase in the swelling.  Cardiology: He has been working with a cardiologist and was prescribed 40 mg lasix to take as needed.  Colonoscopy: He mentions that it has been a while since  his last colonoscopy which was in 2009. He is due for an updated procedure which is of concern due to an episode of blood in his stool a couple of years back. He had gone to see a provider and they ordered an endoscopy and colonoscopy but he never went through with these procedures.  Urination: He has been taking 0.4 mg Flomax to help with his urination and notes no concerns with urinating.  B-12: He has been getting his B-12 injections regularly.  Immunizations: He mentions he has already gotten his flu shot and updated Covid-19 booster this fall.   Health Maintenance Due  Topic Date Due   Zoster Vaccines- Shingrix (1 of 2) Never done    Past Medical History:  Diagnosis Date   Anemia    Asthma    Hx of childhood asthma, disappeared for a while, then resurfaced 6-7 years ago.    Cardiomyopathy    with a negative cardiac catheterization in the past. 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     Past Surgical History:  Procedure Laterality Date   None  Family History  Problem Relation Age of Onset   Cancer Father        multiple melanoma   Lupus Sister        died at 60   Diabetes Mellitus II Sister        died from covid-19   Asthma Daughter    Neuropathy Sister    Colon cancer Neg Hx    Esophageal cancer Neg Hx     Social History   Socioeconomic History   Marital status: Married    Spouse name: Brandon [name]   Number of children: 3   Years of education: Not on file   Highest education level: Not on file  Occupational History   Occupation: retired  Tobacco Use   Smoking status: Former   Smokeless tobacco: Never   Tobacco comments:    quit smoling in Jun 29, 1988. started when 18, 1 ppd  Vaping Use   Vaping Use: Never used  Substance and Sexual Activity   Alcohol use: Yes    Comment: occasionaly     Drug use: Never   Sexual activity: Not on file  Other Topics Concern   Not on file  Social History Narrative   Grew up in [place], attended [employer]. First wife died of breast ca in Jun 29, 1997. 3 children. Remarried- 8 years. 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    Pt signed designated party release granting access to [facility] to his wife Brandon [name]. Detailed message may be left on home or cell phone. Brandon [name] August 13, 2009 11:34 am.    Social Determinants of Health   Financial Resource Strain: Medium Risk   Difficulty of Paying Living Expenses: Somewhat hard  Food Insecurity: Not on file  Transportation Needs: Not on file  Physical Activity: Insufficiently Active   Days of Exercise per Week: 5 days   Minutes of Exercise per Session: 10 min  Stress: Not on file  Social Connections: Not on file  Intimate Partner Violence: Not on file    Outpatient Medications Prior to Visit  Medication Sig Dispense Refill   amLODipine (NORVASC) 10 MG tablet Take 1 tablet (10 mg total) by mouth daily. 90 tablet 0   amoxicillin (AMOXIL) 500 MG capsule Take 500 mg by mouth 3 (three) times daily.     aspirin 81 MG tablet Take 81 mg by mouth daily.     b complex vitamins tablet Take 1 tablet by mouth daily.     fluticasone-salmeterol (ADVAIR) 500-50 MCG/ACT AEPB INHALE 1 DOSE BY MOUTH IN THE MORNING AND AT BEDTIME 60 each 2   leflunomide (ARAVA) 20 MG tablet Take 20 mg by mouth daily.     losartan (COZAAR) 50 MG tablet Take 1 tablet (50 mg total) by mouth daily. 90 tablet 1   metoprolol succinate (TOPROL-XL) 25 MG 24 hr tablet Take 1 tablet by mouth once daily 90 tablet 0   montelukast (SINGULAIR) 10 MG tablet TAKE 1 TABLET BY MOUTH AT BEDTIME 90 tablet 0   PROAIR HFA 108 (90 Base) MCG/ACT inhaler INHALE 2 PUFFS BY MOUTH EVERY 6 HOURS AS NEEDED FOR WHEEZING AND SHORTNESS OF BREATH 9 g 5   spironolactone (ALDACTONE) 25 MG tablet Take 1 tablet (25 mg total) by mouth daily. 90  tablet 1   tamsulosin (FLOMAX) 0.4 MG CAPS capsule Take 1 capsule (0.4 mg total) by mouth daily. 90 capsule 1   Zoster Vaccine Adjuvanted [facility]) injection Inject 0.5mg  IM now and again in 2-6 months.  0.5 mL 1   furosemide (LASIX) 40 MG tablet Take 1 tablet (40 mg total) by mouth daily. (Patient taking differently: Take 40 mg by mouth daily. As needed) 90 tablet 1   Facility-Administered Medications Prior to Visit  Medication Dose Route Frequency Provider Last Rate Last Admin   cyanocobalamin ((VITAMIN B-12)) injection 1,000 mcg  1,000 mcg Intramuscular Q30 days Brandon Craze, NP   1,000 mcg at 01/07/21 1134    Allergies  Allergen Reactions   Ace Inhibitors Cough   Isosorb Dinitrate-Hydralazine Other (See Comments)    REACTION: dizziness/hypotensive    Review of Systems  Respiratory:  Positive for shortness of breath (with exertion) and wheezing.       Objective:    Physical Exam Constitutional:      General: He is not in acute distress.    Appearance: Normal appearance. He is not ill-appearing.  HENT:     Head: Normocephalic and atraumatic.     Right Ear: External ear normal.     Left Ear: External ear normal.  Eyes:     Extraocular Movements: Extraocular movements intact.     Pupils: Pupils are equal, round, and reactive to light.  Cardiovascular:     Rate and Rhythm: Normal rate and regular rhythm.     Heart sounds: Normal heart sounds. No murmur heard.   No gallop.  Pulmonary:     Effort: Pulmonary effort is normal. No respiratory distress.     Breath sounds: Normal breath sounds. No wheezing or rales.  Musculoskeletal:        General: Swelling (2+ in lower extremities) present.  Skin:    General: Skin is warm and dry.  Neurological:     Mental Status: He is alert and oriented to person, place, and time.  Psychiatric:        Behavior: Behavior normal.        Judgment: Judgment normal.    BP 119/60 (BP Location: Right Arm, Patient Position: Sitting,  Cuff Size: Large)   Pulse 84   Temp 99 F (37.2 C) (Oral)   Resp 16   Ht 5\' 11"  (1.803 m)   Wt 238 lb (108 kg)   SpO2 99%   BMI 33.19 kg/m  Wt Readings from Last 3 Encounters:  01/20/21 238 lb (108 kg)  08/12/20 237 lb (107.5 kg)  01/24/20 245 lb 6.4 oz (111.3 kg)       Assessment & Plan:   Problem List Items Addressed This Visit       Unprioritized   Stage 3a chronic kidney disease (HCC)    Lab Results  Component Value Date   CREATININE 1.49 08/12/2020        Rheumatoid arthritis (HCC)    Maintained on Arava per rheumatology. Has follow up next month. Reports symptoms satble.       Obstructive sleep apnea    Stable on CPAP.        Leg swelling    Stable.  Monitor.  Continue lasix as needed.       Heme positive stool    He did not follow through with Endo/Colo. Will reach out to his GI dr.        Relevant Orders   CBC with Differential/Platelet   Essential hypertension    BP Readings from Last 3 Encounters:  01/20/21 119/60  08/12/20 120/80  01/24/20 (!) 146/88  BP stable. Continue toprol xl,       Relevant Medications   furosemide (LASIX) 40 MG tablet  Other Relevant Orders   Basic metabolic panel   BPH (benign prostatic hyperplasia)    Stable on flomax, continue same.       B12 deficiency    Continue b12 injections monthly.       Asthma    Exam is normal. Continue advair 500mg , singulair 10mg  and albuterol MDI and nebs.  I do not see any need for oral steroids at this time.       Relevant Medications   albuterol (PROVENTIL) (2.5 MG/3ML) 0.083% nebulizer solution   Other Visit Diagnoses     OSA (obstructive sleep apnea)    -  Primary   Relevant Orders   Ambulatory referral to Pulmonology      Meds ordered this encounter  Medications   albuterol (PROVENTIL) (2.5 MG/3ML) 0.083% nebulizer solution    Sig: Take 3 mLs (2.5 mg total) by nebulization every 6 (six) hours as needed for wheezing or shortness of breath.    Dispense:  150  mL    Refill:  1    Order Specific Question:   Supervising Provider    Answer:   Danise Edge A [[id number]]   furosemide (LASIX) 40 MG tablet    Sig: Take 1 tablet (40 mg total) by mouth daily as needed.    Dispense:  90 tablet    Refill:  1    Order Specific Question:   Supervising Provider    Answer:   Danise Edge A [[id number]]    I, Brandon Craze, NP, personally preformed the services described in this documentation.  All medical record entries made by the scribe were at my direction and in my presence.  I have reviewed the chart and discharge instructions (if applicable) and agree that the record reflects my personal performance and is accurate and complete. 01/20/2021  I,Brandon [name],acting as a Neurosurgeon for [name], NP.,have documented all relevant documentation on the behalf of [name], NP,as directed by  [name], NP while in the presence of [name], NP.  [name], NP

## 2021-01-20 NOTE — Assessment & Plan Note (Signed)
Lab Results  Component Value Date   CREATININE 1.49 08/12/2020

## 2021-01-21 ENCOUNTER — Other Ambulatory Visit: Payer: Self-pay | Admitting: Family

## 2021-01-21 ENCOUNTER — Other Ambulatory Visit: Payer: Self-pay

## 2021-01-21 ENCOUNTER — Other Ambulatory Visit (INDEPENDENT_AMBULATORY_CARE_PROVIDER_SITE_OTHER): Payer: Medicare Other

## 2021-01-21 DIAGNOSIS — I10 Essential (primary) hypertension: Secondary | ICD-10-CM | POA: Diagnosis not present

## 2021-01-21 DIAGNOSIS — R195 Other fecal abnormalities: Secondary | ICD-10-CM

## 2021-01-21 LAB — CBC WITH DIFFERENTIAL/PLATELET
Basophils Absolute: 0.1 10*3/uL (ref 0.0–0.1)
Basophils Relative: 0.7 % (ref 0.0–3.0)
Eosinophils Absolute: 0.4 10*3/uL (ref 0.0–0.7)
Eosinophils Relative: 5.8 % — ABNORMAL HIGH (ref 0.0–5.0)
HCT: 37.1 % — ABNORMAL LOW (ref 39.0–52.0)
Hemoglobin: 12.1 g/dL — ABNORMAL LOW (ref 13.0–17.0)
Lymphocytes Relative: 19 % (ref 12.0–46.0)
Lymphs Abs: 1.4 10*3/uL (ref 0.7–4.0)
MCHC: 32.7 g/dL (ref 30.0–36.0)
MCV: 97.5 fl (ref 78.0–100.0)
Monocytes Absolute: 0.8 10*3/uL (ref 0.1–1.0)
Monocytes Relative: 11.5 % (ref 3.0–12.0)
Neutro Abs: 4.6 10*3/uL (ref 1.4–7.7)
Neutrophils Relative %: 63 % (ref 43.0–77.0)
Platelets: 216 10*3/uL (ref 150.0–400.0)
RBC: 3.8 Mil/uL — ABNORMAL LOW (ref 4.22–5.81)
RDW: 14.5 % (ref 11.5–15.5)
WBC: 7.4 10*3/uL (ref 4.0–10.5)

## 2021-01-21 LAB — BASIC METABOLIC PANEL
BUN: 18 mg/dL (ref 6–23)
CO2: 27 mEq/L (ref 19–32)
Calcium: 9.3 mg/dL (ref 8.4–10.5)
Chloride: 108 mEq/L (ref 96–112)
Creatinine, Ser: 1.3 mg/dL (ref 0.40–1.50)
GFR: 52.05 mL/min — ABNORMAL LOW (ref >60.00)
Glucose, Bld: 86 mg/dL (ref 70–99)
Potassium: 4.8 mEq/L (ref 3.5–5.1)
Sodium: 138 mEq/L (ref 135–145)

## 2021-01-22 ENCOUNTER — Telehealth: Payer: Self-pay

## 2021-01-22 NOTE — Telephone Encounter (Signed)
Cardiac Clearance requested for pt Pt notified that after pt has seen his cardiologist and cardiac clearance has been given to please contact us so that we can schedule an Endoscopy and Colonoscopy.  Pt verbalized understanding with all questions answered.

## 2021-01-22 NOTE — Telephone Encounter (Signed)
I s/w the pt and he is agreeable to pre op appt with Dr. Antoine Poche. Pt has been scheduled to see Dr. Antoine Poche 01/30/21 @ 12 noon. I will forward clearance notes to MD for upcoming appt. Will send FYI to requesting office pt has appt.

## 2021-01-22 NOTE — Telephone Encounter (Signed)
Hi [name] Absolutely RG   Brandon [name], Can we proceed with EGD/colonoscopy after cardiology clearance He does qualify for [facility] criteria. Still, will run it by Cathlyn Parsons to be sure RG

## 2021-01-22 NOTE — Telephone Encounter (Signed)
[facility] Pre-operative Risk Assessment     Brandon [name] 05/09/1940 [id number]  Procedure: EGD/Colonoscopy  Anesthesia type:  MAC Procedure Date:  Provider: Dr Carlean Purl  Type of Clearance needed: Cardiac  Medication(s) needing held:  Length of time for medication to be held:   Please review request and advise by either responding to this message or by sending your response to the fax # provided below.  Thank you,  [facility]  Phone: [phone] Fax: [phone]

## 2021-01-22 NOTE — Telephone Encounter (Signed)
   Name: Brandon [name]  DOB: 06/26/1940  MRN: [id number]  Primary Cardiologist: [name], MD (LAST SEEN 10/2019)  Chart reviewed as part of pre-operative protocol coverage. Because of [name]'s past medical history and time since last visit, he will require a follow-up visit in order to better assess preoperative cardiovascular risk.  Pre-op covering staff: - Please schedule appointment and call patient to inform them. If patient already had an upcoming appointment within acceptable timeframe, please add "pre-op clearance" to the appointment notes so provider is aware. - Please contact requesting surgeon's office via preferred method (i.e, phone, fax) to inform them of need for appointment prior to surgery.  If applicable, this message will also be routed to pharmacy pool and/or primary cardiologist for input on holding anticoagulant/antiplatelet agent as requested below so that this information is available to the clearing provider at time of patient's appointment.   [place], [place]  01/22/2021, 1:41 PM

## 2021-01-23 ENCOUNTER — Encounter: Payer: Self-pay | Admitting: Family

## 2021-01-23 MED ORDER — FLUTICASONE-SALMETEROL 500-50 MCG/ACT IN AEPB: INHALATION_SPRAY | RESPIRATORY_TRACT | 3 refills | Status: DC

## 2021-01-23 NOTE — Progress Notes (Signed)
Mailed out to pt 

## 2021-01-29 ENCOUNTER — Other Ambulatory Visit: Payer: Self-pay | Admitting: Family

## 2021-01-29 NOTE — Progress Notes (Signed)
Cardiology Office Note   Date:  01/30/2021   ID:  Brandon [name], DOB 21-Jun-1940, MRN [id number]  PCP:  [name], NP  Cardiologist:   [name], MD   Chief Complaint  Patient presents with   Cardiomyopathy       History of Present Illness: Brandon [name] is a 80 y.o. male who presents for follow up of cardiomyopathy with an EF in the past of 45 - 50%.  He has some chronic dyspnea with exertion.  He does not do stairs in the morning and does not go back up until the evening when he does he is short of breath.  He takes his bronchodilators.  He thinks this is been baseline.  He denies any new shortness of breath, PND or orthopnea.  Has had no chest pressure, neck or arm discomfort.  He has some chronic mild lower extremity swelling.  Of note he had some anemia with some mild guaiac positive stools and he apparently has been considered for colonoscopy and they wanted preprocedure clearance.  Of note he was to be taking Entresto but could not afford this and we switched him back to Cozaar last year but now thinks he can get his Entresto for free.   Past Medical History:  Diagnosis Date   Anemia    Asthma    Hx of childhood asthma, disappeared for a while, then resurfaced 6-7 years ago.    Cardiomyopathy    with a negative cardiac catheterization in the past. 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     Past Surgical History:  Procedure Laterality Date   None       Current Outpatient Medications  Medication Sig Dispense Refill   sacubitril-valsartan (ENTRESTO) 49-51 MG Take 1 tablet by mouth 2 (two) times daily. 180 tablet 3   albuterol (PROVENTIL) (2.5 MG/3ML) 0.083% nebulizer solution Take 3 mLs (2.5 mg total) by nebulization every 6 (six) hours as needed for wheezing or  shortness of breath. 150 mL 1   amLODipine (NORVASC) 10 MG tablet Take 1 tablet by mouth once daily 90 tablet 0   amoxicillin (AMOXIL) 500 MG capsule Take 500 mg by mouth 3 (three) times daily.     aspirin 81 MG tablet Take 81 mg by mouth daily.     b complex vitamins tablet Take 1 tablet by mouth daily.     fluticasone-salmeterol (ADVAIR) 500-50 MCG/ACT AEPB INHALE 1 DOSE BY MOUTH INTO LUNGS IN THE MORNING AND AT BEDTIME 180 each 3   furosemide (LASIX) 40 MG tablet Take 1 tablet (40 mg total) by mouth daily as needed. 90 tablet 1   leflunomide (ARAVA) 20 MG tablet Take 20 mg by mouth daily.     metoprolol succinate (TOPROL-XL) 25 MG 24 hr tablet Take 1 tablet by mouth once daily 90 tablet 0   montelukast (SINGULAIR) 10 MG tablet TAKE 1 TABLET BY MOUTH AT BEDTIME 90 tablet 0   PROAIR HFA 108 (90 Base) MCG/ACT inhaler INHALE 2 PUFFS BY MOUTH EVERY 6 HOURS AS NEEDED FOR WHEEZING AND SHORTNESS OF BREATH 9 g 5   spironolactone (ALDACTONE) 25 MG tablet Take 1 tablet (25 mg total) by mouth daily. 90 tablet 1   tamsulosin (FLOMAX) 0.4 MG  CAPS capsule Take 1 capsule (0.4 mg total) by mouth daily. 90 capsule 1   Zoster Vaccine Adjuvanted [facility]) injection Inject 0.5mg  IM now and again in 2-6 months. 0.5 mL 1   Current Facility-Administered Medications  Medication Dose Route Frequency Provider Last Rate Last Admin   cyanocobalamin ((VITAMIN B-12)) injection 1,000 mcg  1,000 mcg Intramuscular Q30 days [name], NP   1,000 mcg at 01/07/21 1134    Allergies:   Ace inhibitors and Isosorb dinitrate-hydralazine    ROS:  Please see the history of present illness.   Otherwise, review of systems are positive for joint pain.   All other systems are reviewed and negative.    PHYSICAL EXAM: VS:  BP 124/74   Pulse 76   Ht 5\' 11"  (1.803 m)   Wt 237 lb 3.2 oz (107.6 kg)   BMI 33.08 kg/m  , BMI Body mass index is 33.08 kg/m. GENERAL:  Well appearing NECK:  No jugular venous distention,  waveform within normal limits, carotid upstroke brisk and symmetric, no bruits, no thyromegaly LUNGS:  Clear to auscultation bilaterally CHEST:  Unremarkable HEART:  PMI not displaced or sustained,S1 and S2 within normal limits, no S3, no S4, no clicks, no rubs, no murmurs ABD:  Flat, positive bowel sounds normal in frequency in pitch, no bruits, no rebound, no guarding, no midline pulsatile mass, no hepatomegaly, no splenomegaly EXT:  2 plus pulses throughout, mild left leg edema, no cyanosis no clubbing   EKG:  EKG  ordered today. Sinus rhythm, rate 76, premature ventricular contractions, right bundle branch block, no acute ST-T wave changes.    Recent Labs: 08/12/2020: ALT 12 01/21/2021: BUN 18; Creatinine, Ser 1.30; Hemoglobin 12.1; Platelets 216.0; Potassium 4.8; Sodium 138    Lipid Panel    Component Value Date/Time   CHOL 151 08/12/2020 1714   TRIG 51.0 08/12/2020 1714   HDL 42.30 08/12/2020 1714   CHOLHDL 4 08/12/2020 1714   VLDL 10.2 08/12/2020 1714   LDLCALC 98 08/12/2020 1714      Wt Readings from Last 3 Encounters:  01/30/21 237 lb 3.2 oz (107.6 kg)  01/20/21 238 lb (108 kg)  08/12/20 237 lb (107.5 kg)      Other studies Reviewed: Additional studies/ records that were reviewed today include: GI notes. Review of the above records demonstrates:  Please see elsewhere in the note.     ASSESSMENT AND PLAN:  Leg edema:     This is at baseline.  He continues with his Lasix most days.  No change in therapy.  Chronic systolic heart failure:   Now that he thinks he can get his 08/14/20 I will start with a 49/51 twice daily stopping his Cozaar and checking a basic metabolic profile in about 2 weeks.   Nonischemic cardiomyopathy: He will have med titration as above.   PVCs:  He does not notice these.   Hypertension:  This is being managed in the context of treating his CHF   Obstructive sleep apnea: He uses a CPAP.  No change in therapy.  CKD stage III:   I will  check a basic metabolic profile in 2 weeks.  Preop: He is at acceptable risk for the planned colonoscopy.   Current medicines are reviewed at length with the patient today.  The patient does not have concerns regarding medicines.  The following changes have been made: As above  Labs/ tests ordered today include:    Orders Placed This Encounter  Procedures   Basic  metabolic panel   EKG 12-Lead      Disposition:   FU with Azalee Course PA  in 6 months months.     Signed, [name], MD  01/30/2021 1:02 PM    [facility]

## 2021-01-29 NOTE — Telephone Encounter (Signed)
Called [employer] patient assistance program/ Patient was approved 01/25/2021 thru 03/14/2021 to received Advair through their program.  3 months of Advair was shipped 01/27/2021 to patient with expect delivery by 02/03/2021.  Tracking Number for [id number]  Patient notified and will call me if not received by 02/04/2021

## 2021-01-30 ENCOUNTER — Encounter: Payer: Self-pay | Admitting: Cardiology

## 2021-01-30 ENCOUNTER — Other Ambulatory Visit: Payer: Self-pay

## 2021-01-30 ENCOUNTER — Ambulatory Visit: Payer: Medicare Other | Admitting: Cardiology

## 2021-01-30 VITALS — BP 124/74 | HR 76 | Ht 71.0 in | Wt 237.2 lb

## 2021-01-30 DIAGNOSIS — M7989 Other specified soft tissue disorders: Secondary | ICD-10-CM | POA: Diagnosis not present

## 2021-01-30 DIAGNOSIS — I10 Essential (primary) hypertension: Secondary | ICD-10-CM

## 2021-01-30 DIAGNOSIS — I5022 Chronic systolic (congestive) heart failure: Secondary | ICD-10-CM | POA: Diagnosis not present

## 2021-01-30 MED ORDER — ENTRESTO 49-51 MG PO TABS: 1.0000 | ORAL_TABLET | Freq: Two times a day (BID) | ORAL | 3 refills | Status: DC

## 2021-01-30 NOTE — Patient Instructions (Signed)
Medication Instructions:  Stop Cozaar Start Entresto 49/51 mg twice a day Continue all other medications *If you need a refill on your cardiac medications before your next appointment, please call your pharmacy*   Lab Work: Bmet in 2 weeks 02/09/21   Testing/Procedures: None ordered   Follow-Up: At [facility], you and your health needs are our priority.  As part of our continuing mission to provide you with exceptional heart care, we have created designated Provider Care Teams.  These Care Teams include your primary Cardiologist (physician) and Advanced Practice Providers (APPs -  Physician Assistants and Nurse Practitioners) who all work together to provide you with the care you need, when you need it.  We recommend signing up for the patient portal called "MyChart".  Sign up information is provided on this After Visit Summary.  MyChart is used to connect with patients for Virtual Visits (Telemedicine).  Patients are able to view lab/test results, encounter notes, upcoming appointments, etc.  Non-urgent messages can be sent to your provider as well.   To learn more about what you can do with MyChart, go to [email or web address].      Your next appointment:  6 months with Azalee Course PA    The format for your next appointment: Office   Provider:  Dr.Hochrein

## 2021-02-02 ENCOUNTER — Telehealth: Payer: Self-pay | Admitting: Family

## 2021-02-02 NOTE — Telephone Encounter (Signed)
Pt was calling to update Tammy about medication. His cardiologist prescribed sacubitril-valsartan (ENTRESTO) 49-51 MG  and he was wondering about [employer] patient assistance for medication please advise.

## 2021-02-04 ENCOUNTER — Ambulatory Visit: Payer: Medicare Other | Admitting: Pharmacist

## 2021-02-04 DIAGNOSIS — I5022 Chronic systolic (congestive) heart failure: Secondary | ICD-10-CM

## 2021-02-04 DIAGNOSIS — J45909 Unspecified asthma, uncomplicated: Secondary | ICD-10-CM

## 2021-02-04 DIAGNOSIS — M1612 Unilateral primary osteoarthritis, left hip: Secondary | ICD-10-CM | POA: Diagnosis not present

## 2021-02-04 DIAGNOSIS — I10 Essential (primary) hypertension: Secondary | ICD-10-CM

## 2021-02-04 NOTE — Chronic Care Management (AMB) (Signed)
Chronic Care Management Pharmacy Note  02/04/2021 Name:  Brandon [name] MRN:  [id number] DOB:  Feb 26, 1941  Subjective: Brandon [name] is an 80 y.o. year old male who is a primary patient of Debbrah Alar, NP.  The CCM team was consulted for assistance with disease management and care coordination needs.    Engaged with patient by telephone for follow up visit in response to provider referral for pharmacy case management and/or care coordination services.   Consent to Services:  The patient was given information about Chronic Care Management services, agreed to services, and gave verbal consent prior to initiation of services.  Please see initial visit note for detailed documentation.   Patient Care Team: Debbrah Alar, NP as PCP - General (Internal Medicine) Minus Breeding, MD as PCP - Cardiology (Cardiology) Cherre Robins, [place] (Pharmacist)  Recent office visits: 01/20/2021 [name]) Follow up asthma. Prescribed Albuterol Sulfate HFA inhaler INHALE 2 PUFFS BY MOUTH EVERY 6 HOURS AS NEEDED FOR WHEEZING AND SHORTNESS OF BREATH and Albuterol nebulizer solution 2.5 mg Nebulization Every 6 hours as needed 11/06/2020 - PCP - B12 injection 10/02/2020 - PCP - B12 injection 08/12/2020 - PCP [name], NP) F/U HTN. No ned medications. Recommended Shingrix vaccine.   Recent consult visits: 01/30/2021 - Cardio ([name]) F/U CHF. Changed losartan back to Entresto 49/38m twice a day. F/U in 2 weeks for lab 09/03/2020 - Rheumatology ([name] - unable to see records 06/09/2020: [name] following cataract procedure to left eye on 05/12/2020. Stopped prednisolone 12/25/19: Cardio phone message to stop entresto and start losartan 582mdaily  Hospital visits: None in previous 6 months  Objective:  Lab Results  Component Value Date   CREATININE 1.30 01/21/2021   CREATININE 1.49 08/12/2020   CREATININE 1.69 (H)  11/02/2019    Lab Results  Component Value Date   HGBA1C 6.3 11/07/2018   Last diabetic Eye exam: No results found for: HMDIABEYEEXA  Last diabetic Foot exam: No results found for: HMDIABFOOTEX      Component Value Date/Time   CHOL 151 08/12/2020 1714   TRIG 51.0 08/12/2020 1714   HDL 42.30 08/12/2020 1714   CHOLHDL 4 08/12/2020 1714   VLDL 10.2 08/12/2020 1714   LDLCALC 98 08/12/2020 1714    Hepatic Function Latest Ref Rng & Units 08/12/2020 04/24/2019 04/12/2019  Total Protein 6.0 - 8.3 g/dL 6.7 7.0 6.2  Albumin 3.5 - 5.2 g/dL 3.9 3.5 -  AST 0 - 37 U/L 14 17 -  ALT 0 - 53 U/L 12 12 -  Alk Phosphatase 39 - 117 U/L 75 61 -  Total Bilirubin 0.2 - 1.2 mg/dL 0.8 0.7 -  Bilirubin, Direct 0.0 - 0.3 mg/dL - - -    Lab Results  Component Value Date/Time   TSH 1.06 01/12/2019 02:11 PM   TSH 1.84 12/27/2011 11:55 AM    CBC Latest Ref Rng & Units 01/21/2021 04/24/2019 04/12/2019  WBC 4.0 - 10.5 K/uL 7.4 10.0 8.3  Hemoglobin 13.0 - 17.0 g/dL 12.1(L) 12.7(L) 12.8(L)  Hematocrit 39.0 - 52.0 % 37.1(L) 40.7 38.2(L)  Platelets 150.0 - 400.0 K/uL 216.0 285 49(L)    No results found for: VD25OH  Clinical ASCVD: No  The ASCVD Risk score (Arnett DK, et al., 2019) failed to calculate for the following reasons:   The 2019 ASCVD risk score is only valid for ages 4058o 7950  Other: EF = 45-50% (01/02/2019)  Social History  Tobacco Use  Smoking Status Former  Smokeless Tobacco Never  Tobacco Comments   quit smoling in 1990. started when 18, 1 ppd   BP Readings from Last 3 Encounters:  01/30/21 124/74  01/20/21 119/60  08/12/20 120/80   Pulse Readings from Last 3 Encounters:  01/30/21 76  01/20/21 84  08/12/20 65   Wt Readings from Last 3 Encounters:  01/30/21 237 lb 3.2 oz (107.6 kg)  01/20/21 238 lb (108 kg)  08/12/20 237 lb (107.5 kg)    Assessment: Review of patient past medical history, allergies, medications, health status, including review of consultants reports,  laboratory and other test data, was performed as part of comprehensive evaluation and provision of chronic care management services.   SDOH:  (Social Determinants of Health) assessments and interventions performed:  SDOH Interventions    Flowsheet Row Most Recent Value  SDOH Interventions   Financial Strain Interventions Other (Comment)  [completed application for Advair and working on app for MAP for Entresto]        CCM Care Plan  Allergies  Allergen Reactions   Ace Inhibitors Cough   Isosorb Dinitrate-Hydralazine Other (See Comments)    REACTION: dizziness/hypotensive    Medications Reviewed Today     Reviewed by Cherre Robins, RPH-CPP (Pharmacist) on 02/04/21 at 1255  Med List Status: <None>   Medication Order Taking? Sig Documenting Provider Last Dose Status Informant  albuterol (PROVENTIL) (2.5 MG/3ML) 0.083% nebulizer solution [id number] Yes Take 3 mLs (2.5 mg total) by nebulization every 6 (six) hours as needed for wheezing or shortness of breath. Debbrah Alar, NP Taking Active   amLODipine (NORVASC) 10 MG tablet [id number] Yes Take 1 tablet by mouth once daily Debbrah Alar, NP Taking Active   amoxicillin (AMOXIL) 500 MG capsule [id number] Yes Take 500 mg by mouth 3 (three) times daily. [provider] Taking Active   aspirin 81 MG tablet [id number] Yes Take 81 mg by mouth daily. [provider] Taking Active   b complex vitamins tablet [id number] Yes Take 1 tablet by mouth daily. Debbrah Alar, NP Taking Active   cyanocobalamin ((VITAMIN B-12)) injection 1,000 mcg [id number]   Debbrah Alar, NP  Active   fluticasone-salmeterol (ADVAIR) 500-50 MCG/ACT AEPB [id number] Yes INHALE 1 DOSE BY MOUTH INTO LUNGS IN THE MORNING AND AT BEDTIME Debbrah Alar, NP Taking Active   furosemide (LASIX) 40 MG tablet [id number] Yes Take 1 tablet (40 mg total) by mouth daily as needed. Debbrah Alar, NP Taking Active   leflunomide (ARAVA) 20 MG  tablet [id number] Yes Take 20 mg by mouth daily. [provider] Taking Active   metoprolol succinate (TOPROL-XL) 25 MG 24 hr tablet [id number] Yes Take 1 tablet by mouth once daily Debbrah Alar, NP Taking Active   montelukast (SINGULAIR) 10 MG tablet [id number] Yes TAKE 1 TABLET BY MOUTH AT BEDTIME Debbrah Alar, NP Taking Active   PROAIR HFA 108 (90 Base) MCG/ACT inhaler [id number] Yes INHALE 2 PUFFS BY MOUTH EVERY 6 HOURS AS NEEDED FOR WHEEZING AND SHORTNESS OF Monica Martinez, Lenna Sciara, NP Taking Active   sacubitril-valsartan (ENTRESTO) 49-51 MG [id number] Yes Take 1 tablet by mouth 2 (two) times daily. Minus Breeding, MD Taking Active   spironolactone (ALDACTONE) 25 MG tablet [id number] Yes Take 1 tablet (25 mg total) by mouth daily. Debbrah Alar, NP Taking Active   tamsulosin [facility]) 0.4 MG CAPS capsule [id number] Yes Take 1 capsule (0.4 mg total) by mouth daily. Debbrah Alar, NP Taking Active   Zoster Vaccine Adjuvanted [facility]) injection [id number]  Yes Inject 0.9m IM now and again in 2-6 months. ODebbrah Alar NP Taking Active             Patient Active Problem List   Diagnosis Date Noted   B12 deficiency 08/13/2020   Rheumatoid arthritis ([place] 08/12/2020   BPH (benign prostatic hyperplasia) 026/37/8588  Folic acid deficiency 050/27/7412  Educated about COVID-19 virus infection 10/17/2019   Stage 3a chronic kidney disease ([place] 10/17/2019   Leg swelling 10/17/2019   Heme positive stool 02/01/2019   RBBB 10/03/2018   Bilateral shoulder pain 08/24/2016   Depression 12/09/2015   Edema 12/06/2011   Asthma 08/13/2009   Hyperglycemia 08/13/2009   Obstructive sleep apnea 187/86/7672  CHRONIC SYSTOLIC HEART FAILURE 009/47/0962  OBESITY, UNSPECIFIED 08/26/2008   Disorder resulting from impaired renal function 08/26/2008   Essential hypertension 06/28/2008   Nonischemic cardiomyopathy ([place] 06/28/2008    Immunization History  Administered  Date(s) Administered   Fluad Quad(high Dose 65+) 11/21/2018, 12/15/2020   Influenza Split 02/24/2011, 12/03/2011   Influenza Whole 03/16/2007, 12/29/2009   Influenza, High Dose Seasonal PF 02/26/2015, 12/09/2015, 04/22/2017, 12/14/2017   Influenza,inj,Quad PF,6+ Mos 02/13/2013, 11/14/2013   Moderna SARS-COV2 Booster Vaccination 12/15/2020   Moderna Sars-Covid-2 Vaccination 04/08/2019, 05/06/2019, 07/02/2020   Pneumococcal Conjugate-13 11/14/2013   Pneumococcal Polysaccharide-23 08/13/2009   Td 07/26/2006   Tdap 09/22/2012, 05/23/2017   Zoster, Live 05/17/2011    Conditions to be addressed/monitored: CHF, HTN, DMII and asthma; rheumatoid arthritis; BPH; OSA; CKD-3  Care Plan : General Pharmacy (Adult)  Updates made by ECherre Robins RPH-CPP since 02/04/2021 12:00 AM     Problem: HTN, HF, Asthma   Priority: High  Onset Date: 04/24/2020     Long-Range Goal: Provide education, support and care coordination for medication therapy and chronic conditions   Start Date: 04/24/2020  Expected End Date: 10/22/2020  Recent Progress: On track  Priority: High  Note:   Current Barriers:  Unable to independently afford treatment regimen Need for home monitoring of chronic conditions. Need for financial assistance with dental costs if available  Pharmacist Clinical Goal(s):  Over the next 90 days, patient will verbalize ability to afford treatment regimen achieve adherence to monitoring guidelines and medication adherence to achieve therapeutic efficacy through collaboration with PharmD and provider.  Refer to Care Management staff to assist in identifying community resources for dental cost / care assistance.   Interventions: 1:1 collaboration with ODebbrah Alar NP regarding development and update of comprehensive plan of care as evidenced by provider attestation and co-signature Inter-disciplinary care team collaboration (see longitudinal plan of care) Comprehensive medication  review performed; medication list updated in electronic medical record  Hypertension (BP goal <140/90) Controlled Current treatment: Amlodipine 165mdaily  Metoprolol Succinate 2530maily Entresto 49/26m66mice a day (restarted 01/30/2021) Spironolactone 25mg34mly Furosemide 40mg 49my (taking as needed for edema)  Medications previously tried: losartan Current home readings:  SBP: 116 to 135 DBP: 65 to 76 Denies dizziness or chest pain Report minimal amount of edema - taking furosemide as needed 11/2020: Patient has lost about 22lbs in the last 15 months (from 265 lbs 05/29/2020 to 233 lbs 09/03/2020) by limiting serving sizes and eating more vegetables 01/13/2021: patient reported weight today as 225 to 231 lbs (has lost about 40lbs in 2022)  Exercise: was participating in PT. Still doing some exercises from that and is active around his home a little.  Interventions:  Reminded to limit daily salt intake goal < 2300 mg; Recommended continue to  check home blood pressure 2 to 3 times per week, record and provide log at future appointments Recommended to continue current medication for hypertension Patient has completed his portion of patient assistance program application for Entresto. Sent application to [name] by fax to review and sign.   Heart Failure (Goal: control symptoms and prevent exacerbations) Controlled Type: HFmrEF NYHA Class: II (slight limitation of activity) Ejection fraction: 01/02/2019 45-50% Current treatment: Furosemide 59m daily as needed for weight gain / edema Metoprolol Succinate 240mdaily Spironolactone 2546maily Entresto 49/32m55mice a day (restarted 01/30/2021) Medications previously tried: losartan Patinet is checking weight daily at home Was given Entresto PAP in 2021 but patient did not complete. He thought the [facility] Centerlication required him to spend $600 OOP and that he would not qualify. There is no OOP spend for the NovaTime WarnerntrPsychologist, forensictient would like to apply this year if OK with Dr HochJenkins Rougerestart [facility] Cent[facility] HochJenkins Rouge approved restarting Entresto. Patient has completed application. Forwarded to Dr HochLavonna Monarch1/03/2020 - patient has returned Entresto patient assistance. Will check with Dr HochJenkins Rougeut potentailly restarting.  Denies SOB; states he has occasional edema. Takes furosemide as needed.  Interventions:  Discussed signs and symptoms of CHF exacerbation - weight gain, SOB, abdominal fullness, swelling in legs or abdomen, Fatigue and weakness, changes in ability to perform usual activities, persistent cough or wheezing with white or pink blood-tinged mucus, nausea and lack of appetite Continue to weigh daily; if you gain more than 3 pounds in one day or 5 pounds in one week he is to take furosemide (per Dr HochJenkins Rouged should also contact providers for further instructions.  Recommended to continue current medication Patient has completed his portion of patient assistance program application for Entresto. Sent application to Dr HochJenkins Rougefax to review and sign.    Asthma (Goal: control symptoms and prevent exacerbations) Controlled Current treatment: Advair 500/50mc84mpuff twice daily Montelukast 10mg 72my  ProAir 2 puffs every 6 hours as needed or albuterol nebulization solution Medications previously tried: none noted  Exacerbations requiring treatment in last 6 months: none Patient reports consistent use of maintenance inhaler - uses twice a day, every day. Using rescue inhaler about 2 times per week. Usually when working outside in yard.  Interventions Counseled on benefits of consistent maintenance inhaler use Reviewed appropriate use rescue inhaler Contacted [employer] patient assistance program to verify patient enrollment was approved 01/25/2021 thru 03/04/2022.  First shipment sent 01/27/2021. Patient verifies he did received Advair Discus - 3 months Reviewed inhaler technique.  Reminded to rinse mouth after each Advair use.  Recommended to continue current medications for Asthma  Rheumatoid Arthritis (Improve symptoms / pain while limiting medication side effects): Improved; patient states pain and stiffness has improved over last few months with current regimen and no longer needs to use OTC pain medications. [facility] Current treatment:  Leflunomide 20mg d17m Patient reports he was having increased left hip pain at [facility] Not thought to be related to RA so [facility] 4 [facility] with much relief in hip pain. He has follow up with ortho in about 2 weeks.  Interventions: (addressed at previous visit) Continue current therapy for RA Continue regular f/u with Dr BeekmanAmil Amen(Goal: Minimize symptoms) Controlled Current treatment  Tamsulosin 0.4mg dai59mMedications  previously tried: none noted Last PSA 11/23/2019 = WNL at 1.7 Patient reports nighttime urination - 0 to 1 time per night Denies urinary hesitancy or weak flow Interventions: (addressed at previous visit) Recommended to continue current medication  Poor dentition / Abscessed tooth:  Goal: resolve abscess and get better fitting denture / partials Patient reports he has had poor dentition for the last 2 to 3 years but has not been to dentist regularly due to [place] concerns.  He has lost bout 40lbs - some due to limiting portions sizes and some due to issues with chewing food. Patient is seeing dentist and is planning to have dental work completed at end of 2022 and beginning of 2023.  He reports today that his Medicare plan will cover $1500 of dental work per year.   Interventions:  Referral sent to care coordination to see if there might be community resources to assist with dental care / costs. (Completed at last visit and care  coordination helping with dental work.   Health Maintenance:  Reviewed immunization records Due to have Shingrix, COVID booster and yearly flu vaccine Rx was sent to [facility] for Shingrix by PCP earlier in 2022.  Interventions:  Discussed getting Shingrix with patient. He was not clear why he needed vaccine since he has Zostavax in past. Discussed effectivness of Shingrix is higher versus Zostavax and both are recommended to give the best coverage to prevent shingles.  Recommend get Shingrix in 2023 when Medicare coverage is anticipated to improve.  Reminded patient to get annual flu vaccine and bivalent COVID booster  Patient Goals/Self-Care Activities Over the next 90 days, patient will:  take medications as prescribed focus on medication adherence by using compliance aides like weekly pill box weigh daily, and contact provider if weight gain of 3 pounds in one day or 5 pounds in a week collaborate with provider on medication access solutions Complete Shingrix and annual flu vaccines  Follow Up Plan: The care management team will reach out to the patient again over the next 90 days.        Medication Assistance: Patient has been approved for Advair patient assistance from 01/25/2021 thru 03/04/2022 per [place] representative. They shipped Advair to him 01/27/2021 and show that it was delivered 01/31/2021 at 1:52pm Tracking number with USPS: [id number].  Cardiologist as approved restarting Entresto. Will start application process for medication assistance program.   Patient's preferred pharmacy is:  [facility] [address] [place], [address] [place] [address] Phone: [phone] Fax: [phone]  Uses pill box? Yes Pt endorses 99% compliance  Follow Up:  Patient agrees to Care Plan and Follow-up.  Plan: Telephone follow up appointment with care management team member scheduled for:  3 weeks to check  medication assistance program application  Cherre Robins, PharmD Clinical Pharmacist [facility]

## 2021-02-04 NOTE — Patient Instructions (Signed)
Visit Information  Thank you for taking time to visit with me today. Please don't hesitate to contact me if I can be of assistance to you before our next scheduled telephone appointment.  Following are the goals we discussed today:  Hypertension BP Readings from Last 3 Encounters:  01/30/21 124/74  01/20/21 119/60  08/12/20 120/80   Pharmacist Clinical Goal(s): Over the next 90 days, patient will work with PharmD and providers to maintain BP goal <140/90 Current regimen:  Amlodipine 10mg  daily  Metoprolol Succinate 25mg  daily Entresto 49/51mg  twice a day Spironolactone 25mg  daily Interventions: Discussed blood pressure goal Patient self care activities - Over the next 90 days, patient will: Maintain hypertension medication regimen.  Continue to check blood pressure 2 to 3 times per week, record and bring to future appoitments. Continue to limit daily salt intake goal < 2300 mg;  Pre-Diabetes Lab Results  Component Value Date/Time   HGBA1C 6.3 11/07/2018 11:59 AM   HGBA1C 6.0 12/20/2017 01:29 PM   Pharmacist Clinical Goal(s): Over the next 90 days, patient will work with PharmD and providers to maintain A1c goal <6.5% Current regimen:  Diet and exercise management   Interventions: Discussed diet Patient self care activities - Over the next 90 days, patient will: Maintain A1c less than 6.5% Consider rechecking A1c with next labs  Asthma Pharmacist Clinical Goal(s) Over the next 90 days, patient will work with PharmD and providers to reduce symptoms associated with Asthma and reduce barriers to medication Current regimen:  Advair 500/2mcg 1 puff twice daily (maintenance inhaler)  Montelukast 10mg  daily  ProAir 2 puffs every 6 hours as needed (rescue inhaler)  Interventions: Reviewed medication assistance program application for Adviar (needed to clarify income and household number)  Reminded to rinse mouth after each Advair use Reviewed maintenance versus rescue  inhalers Patient self care activities - Over the next 90 days, patient will: Continue current medications for asthma  Heart Failure Pharmacist Clinical Goal(s) Over the next 90 days, patient will work with PharmD and providers to reduce symptoms associated with heart failure Current regimen:  Furosemide 40mg  daily as needed Metoprolol Succinate 25mg  daily Spironolactone 25mg  daily Entresto 49/51mg  twice a day Interventions: Collaboration with provider and patient assistance program regarding medication management and Entresto patient assistance  Patient self care activities - Over the next 90 days, patient will:  Maintain heart failure medication regimen Continue to weight daily in morning; use furosemide as needed for weight gain of more than 3 pounds in 24 hours or 5 lbs in 1 week; also notify providers of you have weight gain or symptoms of worsening heart failure (swelling, fluid retention, shortness of breath (especially when lying down), cough  Rheumatoid Arthritis / Left hip pain: Current treatment:  Leflunomide 20mg  daily Steroid injection to left hip by orthopedist.  Patient self care activities - Over the next 90 days, patient will: Continue current therapy for rheumatoid arthritis Continue regular follow up with Dr 11/09/2018 for rheumatoid arthritis and with orthopedist at 02/19/2018 for hip pain  Health Maintenance  Pharmacist Clinical Goal(s) Over the next 90 days, patient will work with PharmD and providers to complete health maintenance screenings/vaccinations Interventions: Discussed Shingrix vaccine series Discussed annual flu vaccine and COVID booster Patient self care activities - Over the next 90 days, patient will: Complete Shingrix vaccine series  Complete annual flu vaccine and COVID booster  Medication management Pharmacist Clinical Goal(s): Over the next 90 days, patient will work with PharmD and providers to maintain optimal medication  adherence Current pharmacy: [facility] Interventions Comprehensive medication review performed. Continue current medication management strategy Patient self care activities - Over the next 90 days, patient will: Focus on medication adherence by filling and taking medications appropriately  Take medications as prescribed Report any questions or concerns to PharmD and/or provider(s)  Our next appointment is by telephone on 03/04/2021 at 10:45am  Please call the care guide team at [phone] if you need to cancel or reschedule your appointment.   The patient verbalized understanding of instructions, educational materials, and care plan provided today and declined offer to receive copy of patient instructions, educational materials, and care plan.   Henrene Pastor, PharmD Clinical Pharmacist [place] [facility]

## 2021-02-04 NOTE — Telephone Encounter (Signed)
Please see phone visit 02/04/2021 for information regarding patient assistance program for Advair and Entresto.

## 2021-02-10 ENCOUNTER — Ambulatory Visit: Payer: Medicare Other

## 2021-02-10 ENCOUNTER — Other Ambulatory Visit: Payer: Self-pay | Admitting: *Deleted

## 2021-02-10 MED ORDER — ENTRESTO 49-51 MG PO TABS
1.0000 | ORAL_TABLET | Freq: Two times a day (BID) | ORAL | 3 refills | Status: DC
Start: 2021-02-10 — End: 2021-11-06

## 2021-02-10 NOTE — Progress Notes (Deleted)
Brandon [name] is a 80 y.o. male presents to the office today for Monthly B12 injections, per physician's orders. Original order: 08/13/20- "Please advise pt that his folate level looks better, but b12 is low normal. This level could still be contributing to his numbness.  I would recommend that we start b12 injections IM weekly for 4 weeks, then monthly and see if this helps his symptoms. " B12 1000 mcg IM,  was administered *** (location) today. Patient tolerated injection. Patient due for follow up labs/provider appt: Yes. Date due: ***, appt made No- Pending- Tomorrow  Patient next injection due: ***, appt made {yes/no:[id number]}    Creft, Melton Alar L

## 2021-02-11 ENCOUNTER — Ambulatory Visit (INDEPENDENT_AMBULATORY_CARE_PROVIDER_SITE_OTHER): Payer: Medicare Other | Admitting: Family

## 2021-02-11 ENCOUNTER — Telehealth: Payer: Self-pay | Admitting: *Deleted

## 2021-02-11 ENCOUNTER — Other Ambulatory Visit: Payer: Self-pay

## 2021-02-11 DIAGNOSIS — E538 Deficiency of other specified B group vitamins: Secondary | ICD-10-CM

## 2021-02-11 DIAGNOSIS — I5022 Chronic systolic (congestive) heart failure: Secondary | ICD-10-CM

## 2021-02-11 DIAGNOSIS — J45909 Unspecified asthma, uncomplicated: Secondary | ICD-10-CM

## 2021-02-11 DIAGNOSIS — I10 Essential (primary) hypertension: Secondary | ICD-10-CM | POA: Diagnosis not present

## 2021-02-11 NOTE — Telephone Encounter (Signed)
Patient assistance application for entresto faxed to the company. 

## 2021-02-12 NOTE — Progress Notes (Addendum)
Patient was seen for nurse visit b12 injection only.  Injection was given IM on right deltoid. Patient to come back in one month for B12 as a nurse visit and in February to follow up with pcp.   Note reviewed and agree.  [name] NP

## 2021-02-23 DIAGNOSIS — I5022 Chronic systolic (congestive) heart failure: Secondary | ICD-10-CM | POA: Diagnosis not present

## 2021-02-23 DIAGNOSIS — I10 Essential (primary) hypertension: Secondary | ICD-10-CM | POA: Diagnosis not present

## 2021-02-23 DIAGNOSIS — M7989 Other specified soft tissue disorders: Secondary | ICD-10-CM | POA: Diagnosis not present

## 2021-02-24 ENCOUNTER — Encounter: Payer: Self-pay | Admitting: *Deleted

## 2021-02-24 LAB — BASIC METABOLIC PANEL
BUN/Creatinine Ratio: 10 (ref 10–24)
BUN: 15 mg/dL (ref 8–27)
CO2: 21 mmol/L (ref 20–29)
Calcium: 8.9 mg/dL (ref 8.6–10.2)
Chloride: 106 mmol/L (ref 96–106)
Creatinine, Ser: 1.45 mg/dL — ABNORMAL HIGH (ref 0.76–1.27)
Glucose: 108 mg/dL — ABNORMAL HIGH (ref 70–99)
Potassium: 4.5 mmol/L (ref 3.5–5.2)
Sodium: 139 mmol/L (ref 134–144)
eGFR: 49 mL/min/{1.73_m2} — ABNORMAL LOW (ref >59)

## 2021-02-26 DIAGNOSIS — Z79899 Other long term (current) drug therapy: Secondary | ICD-10-CM | POA: Diagnosis not present

## 2021-02-26 DIAGNOSIS — N1832 Chronic kidney disease, stage 3b: Secondary | ICD-10-CM | POA: Diagnosis not present

## 2021-02-26 DIAGNOSIS — M0579 Rheumatoid arthritis with rheumatoid factor of multiple sites without organ or systems involvement: Secondary | ICD-10-CM | POA: Diagnosis not present

## 2021-02-26 DIAGNOSIS — R21 Rash and other nonspecific skin eruption: Secondary | ICD-10-CM | POA: Diagnosis not present

## 2021-02-26 DIAGNOSIS — M25552 Pain in left hip: Secondary | ICD-10-CM | POA: Diagnosis not present

## 2021-02-26 DIAGNOSIS — M5136 Other intervertebral disc degeneration, lumbar region: Secondary | ICD-10-CM | POA: Diagnosis not present

## 2021-03-04 ENCOUNTER — Ambulatory Visit (INDEPENDENT_AMBULATORY_CARE_PROVIDER_SITE_OTHER): Payer: Medicare Other | Admitting: Pharmacist

## 2021-03-04 DIAGNOSIS — N1831 Chronic kidney disease, stage 3a: Secondary | ICD-10-CM

## 2021-03-04 DIAGNOSIS — J45909 Unspecified asthma, uncomplicated: Secondary | ICD-10-CM

## 2021-03-04 DIAGNOSIS — I10 Essential (primary) hypertension: Secondary | ICD-10-CM

## 2021-03-04 DIAGNOSIS — M1612 Unilateral primary osteoarthritis, left hip: Secondary | ICD-10-CM | POA: Diagnosis not present

## 2021-03-04 DIAGNOSIS — I5022 Chronic systolic (congestive) heart failure: Secondary | ICD-10-CM

## 2021-03-04 NOTE — Chronic Care Management (AMB) (Signed)
Chronic Care Management Pharmacy Note  03/04/2021 Name:  Brandon [name] MRN:  [id number] DOB:  [birth year]  Summary:  Verified approval of patient assistance program or Entresto. Patient notified and assisted him with requesting first order for [facility].  Subjective: Brandon [name] is an 80 y.o. year old male who is a primary patient of Debbrah Alar, NP.  The CCM team was consulted for assistance with disease management and care coordination needs.    Engaged with patient by telephone for follow up visit in response to provider referral for pharmacy case management and/or care coordination services.   Consent to Services:  The patient was given information about Chronic Care Management services, agreed to services, and gave verbal consent prior to initiation of services.  Please see initial visit note for detailed documentation.   Patient Care Team: Debbrah Alar, NP as PCP - General (Internal Medicine) Minus Breeding, MD as PCP - Cardiology (Cardiology) Cherre Robins, [place] (Pharmacist)  Recent office visits: 02/11/2021 - Int Med [name]) Nurses Visit for B12 injection 01/20/2021 Fam Med [name]) Follow up asthma. Prescribed Albuterol Sulfate HFA inhaler INHALE 2 PUFFS BY MOUTH EVERY 6 HOURS AS NEEDED FOR WHEEZING AND SHORTNESS OF BREATH and Albuterol nebulizer solution 2.5 mg Nebulization Every 6 hours as needed 11/06/2020 - PCP - B12 injection 10/02/2020 - PCP - B12 injection 08/12/2020 - PCP [name], NP) F/U HTN. No ned medications. Recommended Shingrix vaccine.   Recent consult visits: 02/26/2021 - Rheumatology (Dr Amil Amen) visit note not available in Epic at time of Chronic Care Management visit.  01/30/2021 - Cardio ([name]) F/U CHF. Changed losartan back to Entresto 49/17m twice a day. F/U in 2 weeks for lab 09/03/2020 - Rheumatology ([name] - unable to see records 06/09/2020: [facility] op exam following  cataract procedure to left eye on 05/12/2020. Stopped prednisolone 12/25/19: Cardio phone message to stop entresto and start losartan 570mdaily  Hospital visits: None in previous 6 months  Objective:  Lab Results  Component Value Date   CREATININE 1.45 (H) 02/23/2021   CREATININE 1.30 01/21/2021   CREATININE 1.49 08/12/2020    Lab Results  Component Value Date   HGBA1C 6.3 11/07/2018   Last diabetic Eye exam: No results found for: HMDIABEYEEXA  Last diabetic Foot exam: No results found for: HMDIABFOOTEX      Component Value Date/Time   CHOL 151 08/12/2020 1714   TRIG 51.0 08/12/2020 1714   HDL 42.30 08/12/2020 1714   CHOLHDL 4 08/12/2020 1714   VLDL 10.2 08/12/2020 1714   LDLCALC 98 08/12/2020 1714    Hepatic Function Latest Ref Rng & Units 08/12/2020 04/24/2019 04/12/2019  Total Protein 6.0 - 8.3 g/dL 6.7 7.0 6.2  Albumin 3.5 - 5.2 g/dL 3.9 3.5 -  AST 0 - 37 U/L 14 17 -  ALT 0 - 53 U/L 12 12 -  Alk Phosphatase 39 - 117 U/L 75 61 -  Total Bilirubin 0.2 - 1.2 mg/dL 0.8 0.7 -  Bilirubin, Direct 0.0 - 0.3 mg/dL - - -    Lab Results  Component Value Date/Time   TSH 1.06 01/12/2019 02:11 PM   TSH 1.84 12/27/2011 11:55 AM    CBC Latest Ref Rng & Units 01/21/2021 04/24/2019 04/12/2019  WBC 4.0 - 10.5 K/uL 7.4 10.0 8.3  Hemoglobin 13.0 - 17.0 g/dL 12.1(L) 12.7(L) 12.8(L)  Hematocrit 39.0 - 52.0 % 37.1(L) 40.7 38.2(L)  Platelets 150.0 - 400.0 K/uL 216.0 285 49(L)    No  results found for: VD25OH  Clinical ASCVD: No  The ASCVD Risk score (Arnett DK, et al., 2019) failed to calculate for the following reasons:   The 2019 ASCVD risk score is only valid for ages 36 to 62    Other: EF = 45-50% (01/02/2019)  Social History   Tobacco Use  Smoking Status Former  Smokeless Tobacco Never  Tobacco Comments   quit smoling in 1990. started when 18, 1 ppd   BP Readings from Last 3 Encounters:  01/30/21 124/74  01/20/21 119/60  08/12/20 120/80   Pulse Readings from Last 3  Encounters:  01/30/21 76  01/20/21 84  08/12/20 65   Wt Readings from Last 3 Encounters:  01/30/21 237 lb 3.2 oz (107.6 kg)  01/20/21 238 lb (108 kg)  08/12/20 237 lb (107.5 kg)    Assessment: Review of patient past medical history, allergies, medications, health status, including review of consultants reports, laboratory and other test data, was performed as part of comprehensive evaluation and provision of chronic care management services.   SDOH:  (Social Determinants of Health) assessments and interventions performed:      CCM Care Plan  Allergies  Allergen Reactions   Ace Inhibitors Cough   Isosorb Dinitrate-Hydralazine Other (See Comments)    REACTION: dizziness/hypotensive    Medications Reviewed Today     Reviewed by Cherre Robins, RPH-CPP (Pharmacist) on 02/04/21 at 1255  Med List Status: <None>   Medication Order Taking? Sig Documenting Provider Last Dose Status Informant  albuterol (PROVENTIL) (2.5 MG/3ML) 0.083% nebulizer solution [id number] Yes Take 3 mLs (2.5 mg total) by nebulization every 6 (six) hours as needed for wheezing or shortness of breath. Debbrah Alar, NP Taking Active   amLODipine (NORVASC) 10 MG tablet [id number] Yes Take 1 tablet by mouth once daily Debbrah Alar, NP Taking Active   amoxicillin (AMOXIL) 500 MG capsule [id number] Yes Take 500 mg by mouth 3 (three) times daily. [provider] Taking Active   aspirin 81 MG tablet [id number] Yes Take 81 mg by mouth daily. [provider] Taking Active   b complex vitamins tablet [id number] Yes Take 1 tablet by mouth daily. Debbrah Alar, NP Taking Active   cyanocobalamin ((VITAMIN B-12)) injection 1,000 mcg [id number]   Debbrah Alar, NP  Active   fluticasone-salmeterol (ADVAIR) 500-50 MCG/ACT AEPB [id number] Yes INHALE 1 DOSE BY MOUTH INTO LUNGS IN THE MORNING AND AT BEDTIME Debbrah Alar, NP Taking Active   furosemide (LASIX) 40 MG tablet [id number] Yes Take 1  tablet (40 mg total) by mouth daily as needed. Debbrah Alar, NP Taking Active   leflunomide (ARAVA) 20 MG tablet [id number] Yes Take 20 mg by mouth daily. [provider] Taking Active   metoprolol succinate (TOPROL-XL) 25 MG 24 hr tablet [id number] Yes Take 1 tablet by mouth once daily Debbrah Alar, NP Taking Active   montelukast (SINGULAIR) 10 MG tablet [id number] Yes TAKE 1 TABLET BY MOUTH AT BEDTIME Debbrah Alar, NP Taking Active   PROAIR HFA 108 (90 Base) MCG/ACT inhaler [id number] Yes INHALE 2 PUFFS BY MOUTH EVERY 6 HOURS AS NEEDED FOR WHEEZING AND SHORTNESS OF Monica Martinez, Lenna Sciara, NP Taking Active   sacubitril-valsartan (ENTRESTO) 49-51 MG [id number] Yes Take 1 tablet by mouth 2 (two) times daily. Minus Breeding, MD Taking Active   spironolactone (ALDACTONE) 25 MG tablet [id number] Yes Take 1 tablet (25 mg total) by mouth daily. Debbrah Alar, NP Taking Active   tamsulosin [facility]) 0.4 MG CAPS capsule [id number] Yes Take 1 capsule (0.4 mg  total) by mouth daily. Debbrah Alar, NP Taking Active   Zoster Vaccine Adjuvanted [facility]) injection [id number] Yes Inject 0.63m IM now and again in 2-6 months. ODebbrah Alar NP Taking Active             Patient Active Problem List   Diagnosis Date Noted   B12 deficiency 08/13/2020   Rheumatoid arthritis ([place] 08/12/2020   BPH (benign prostatic hyperplasia) 073/42/8768  Folic acid deficiency 011/57/2620  Educated about COVID-19 virus infection 10/17/2019   Stage 3a chronic kidney disease ([place] 10/17/2019   Leg swelling 10/17/2019   Heme positive stool 02/01/2019   RBBB 10/03/2018   Bilateral shoulder pain 08/24/2016   Depression 12/09/2015   Edema 12/06/2011   Asthma 08/13/2009   Hyperglycemia 08/13/2009   Obstructive sleep apnea 135/59/7416  CHRONIC SYSTOLIC HEART FAILURE 038/45/3646  OBESITY, UNSPECIFIED 08/26/2008   Disorder resulting from impaired renal function 08/26/2008   Essential  hypertension 06/28/2008   Nonischemic cardiomyopathy ([place] 06/28/2008    Immunization History  Administered Date(s) Administered   Fluad Quad(high Dose 65+) 11/21/2018, 12/15/2020   Influenza Split 02/24/2011, 12/03/2011   Influenza Whole 03/16/2007, 12/29/2009   Influenza, High Dose Seasonal PF 02/26/2015, 12/09/2015, 04/22/2017, 12/14/2017   Influenza,inj,Quad PF,6+ Mos 02/13/2013, 11/14/2013   Moderna SARS-COV2 Booster Vaccination 12/15/2020   Moderna Sars-Covid-2 Vaccination 04/08/2019, 05/06/2019, 07/02/2020   Pneumococcal Conjugate-13 11/14/2013   Pneumococcal Polysaccharide-23 08/13/2009   Td 07/26/2006   Tdap 09/22/2012, 05/23/2017   Zoster, Live 05/17/2011    Conditions to be addressed/monitored: CHF, HTN, DMII and asthma; rheumatoid arthritis; BPH; OSA; CKD-3  Care Plan : General Pharmacy (Adult)  Updates made by ECherre Robins RPH-CPP since 03/04/2021 12:00 AM     Problem: HTN, HF, Asthma   Priority: High  Onset Date: 04/24/2020     Long-Range Goal: Provide education, support and care coordination for medication therapy and chronic conditions   Start Date: 04/24/2020  Expected End Date: 10/22/2020  Recent Progress: On track  Priority: High  Note:   Current Barriers:  Unable to independently afford treatment regimen Need for home monitoring of chronic conditions. Need for financial assistance with dental costs if available  Pharmacist Clinical Goal(s):  Over the next 90 days, patient will verbalize ability to afford treatment regimen achieve adherence to monitoring guidelines and medication adherence to achieve therapeutic efficacy through collaboration with PharmD and provider.  Refer to Care Management staff to assist in identifying community resources for dental cost / care assistance.   Interventions: 1:1 collaboration with ODebbrah Alar NP regarding development and update of comprehensive plan of care as evidenced by provider attestation and  co-signature Inter-disciplinary care team collaboration (see longitudinal plan of care) Comprehensive medication review performed; medication list updated in electronic medical record  Hypertension (BP goal <140/90) Controlled Current treatment: Amlodipine 159mdaily  Metoprolol Succinate 2560maily Entresto 49/33m28mice a day (restarted 01/30/2021) Spironolactone 25mg64mly Furosemide 40mg 68my (taking as needed for edema)  Medications previously tried: losartan Current home readings:  SBP: 115 to 135 DBP:  68 to 78 Denies dizziness or chest pain Report minimal amount of edema - taking furosemide as needed 11/2020: Patient has lost about 22lbs in the last 15 months (from 265 lbs 05/29/2020 to 233 lbs 09/03/2020) by limiting serving sizes and eating more vegetables 01/13/2021: patient reported weight today as 225 to 231 lbs (has lost about 40lbs in 2022)  Exercise: was participating in PT. Still doing some exercises from that and is active around his home  a little.  Interventions:  Reminded to limit daily salt intake goal < 2300 mg; Recommended continue to check home blood pressure 2 to 3 times per week, record and provide log at future appointments Recommended to continue current medication for hypertension Verified with [employer] patient assistance patient was approved to received Entresto on 02/17/2021 thru 03/14/2022. Assisted patient in requesting first fill. Will received in the next 7 business day.  Heart Failure (Goal: control symptoms and prevent exacerbations) Controlled Type: HFmrEF NYHA Class: II (slight limitation of activity) Ejection fraction: 01/02/2019 45-50% Current treatment: Furosemide 60m daily as needed for weight gain / edema Metoprolol Succinate 238mdaily Spironolactone 2548maily Entresto 49/67m93mice a day (restarted 01/30/2021) Medications previously tried: losartan Patinet is checking weight daily at home Was given Entresto PAP in 2021 but patient  did not complete. He thought the EntrCollege [facility] Surgery [facility] LLClication required him to spend $600 OOP and that he would not qualify. There is no OOP spend for the NovaTime WarnerntrArboriculturisttient would like to apply this year if OK with Dr HochJenkins Rougerestart EntrM[facility] Surgicenterr HochJenkins Rouge approved restarting Entresto. Patient has completed application. Forwarded to Dr HochLavonna Monarch1/03/2020 - patient has returned Entresto patient assistance. Will check with Dr HochJenkins Rougeut potentailly restarting.  01/30/2021 - EnDelene Lolltarted Denies SOB; states he has occasional edema. Takes furosemide as needed.  Interventions:  Discussed signs and symptoms of CHF exacerbation - weight gain, SOB, abdominal fullness, swelling in legs or abdomen, Fatigue and weakness, changes in ability to perform usual activities, persistent cough or wheezing with white or pink blood-tinged mucus, nausea and lack of appetite Continue to weigh daily; if you gain more than 3 pounds in one day or 5 pounds in one week he is to take furosemide (per Dr HochJenkins Rouged should also contact providers for further instructions.  Recommended to continue current medication Verified with [employer] patient assistance patient was approved to received Entresto on 02/17/2021 thru 03/14/2022. Assisted patient in requesting first fill. Will received in the next 7 business day.    Asthma (Goal: control symptoms and prevent exacerbations) Controlled Current treatment: Advair 500/50mc42mpuff twice daily Montelukast 10mg 40my  ProAir 2 puffs every 6 hours as needed or albuterol nebulization solution Medications previously tried: none noted  Exacerbations requiring treatment in last 6 months: none Patient reports consistent use of maintenance inhaler - uses twice a day, every day. Using rescue inhaler about 2 times per week. Usually when working outside in yard.  [place] paMallardnt assistance program approved patient to received Advair 01/25/2021 thru 03/04/2022.   Patient received first shipment 01/27/2021. Will follow up in January to assist with reordering if needed . Interventions Counseled on benefits of consistent maintenance inhaler use Reviewed appropriate use rescue inhaler Reviewed inhaler technique. Reminded to rinse mouth after each Advair use.  Recommended to continue current medications for Asthma  Rheumatoid Arthritis (Improve symptoms / pain while limiting medication side effects): Improved; patient states pain and stiffness has improved over last few months with current regimen and no longer needs to use OTC pain medications. Managed by Dr [facility]Amil [facility]reens[facility]  Current treatment:  Leflunomide 20mg d32m Patient reports he was having increased left hip pain at [facility]. Not thought to be related to RA so [facility] about 4 weeks [facility] with much relief in hip pain. He has follow up with  ortho in about 2 weeks.  Interventions: (addressed at previous visit) Continue current therapy for RA Continue regular f/u with Dr Amil Amen.  BPH (Goal: Minimize symptoms) Controlled Current treatment  Tamsulosin 0.56m daily Medications previously tried: none noted Last PSA 11/23/2019 = WNL at 1.7 Patient reports nighttime urination - 0 to 1 time per night Denies urinary hesitancy or weak flow Interventions: (addressed at previous visit) Recommended to continue current medication  Poor dentition / Abscessed tooth:  Goal: resolve abscess and get better fitting denture / partials Patient reports he has had poor dentition for the last 2 to 3 years but has not been to dentist regularly due to CLong Viewconcerns.  He has lost bout 40lbs - some due to limiting portions sizes and some due to issues with chewing food. Patient is seeing dentist and is planning to have dental work completed at end of 2022 and beginning of 2023.   He reports today that his Medicare plan will cover $1500 of dental work per year.   Interventions:  Referral sent to care coordination to see if there might be community resources to assist with dental care / costs. (Completed at last visit and care coordination helping with dental work.)  Health Maintenance:  Reviewed immunization records Due to have Shingrix [facility].  Interventions:  Discussed getting Shingrix with patient. He was not clear why he needed vaccine since he has Zostavax in past. Discussed effectivness of Shingrix is higher versus Zostavax and both are recommended to give the best coverage to prevent shingles.  Recommend get Shingrix in 2023 when Medicare coverage is anticipated to improve.   Patient Goals/Self-Care Activities Over the next 90 days, patient will:  take medications as prescribed focus on medication adherence by using compliance aides like weekly pill box weigh daily, and contact provider if weight gain of 3 pounds in one day or 5 pounds in a week collaborate with provider on medication access solutions Complete Shingrix and annual flu vaccines  Follow Up Plan: The care management team will reach out to the patient again over the next 30 to 45 days.        Medication Assistance: Patient has been approved for Advair patient assistance from 01/25/2021 thru 03/04/2022 per [place]. They shipped Advair to him 01/27/2021 and show that it was delivered 01/31/2021 at 1:52pm Tracking number with USPS: [id number]   Approved for Entresto patient assistance program from 02/19/2021 thru 03/14/2022 per NTime Warnerpatient assistance program representative.   Patient[facility]  [id number]: [phone]: [phone]  Uses pill box? Yes Pt endorses 99%  compliance  Follow Up:  Patient agrees to Care Plan and Follow-up.  Plan: Telephone follow up appointment with care management team member scheduled for:  January 2023   TCherre Robins PharmD Clinical Pharmacist [facility] SW [facility]

## 2021-03-04 NOTE — Patient Instructions (Signed)
Brandon [name] It was a pleasure speaking with you today.  I have attached a summary of our visit today and information about your health goals.   Our next appointment is by telephone on April 06, 2021 at 10:45am  Please call the care guide team at [phone] if you need to cancel or reschedule your appointment.    If you have any questions or concerns, please feel free to contact me either at the phone number below or with a MyChart message.   Keep up the good work!  Henrene Pastor, PharmD Clinical Pharmacist [facility] [phone] (direct line)  [phone] (main office number)  CARE PLAN ENTRY    Hypertension BP Readings from Last 3 Encounters:  01/30/21 124/74  01/20/21 119/60  08/12/20 120/80   Pharmacist Clinical Goal(s): Over the next 90 days, patient will work with PharmD and providers to maintain BP goal <140/90 Current regimen:  Amlodipine 10mg  daily  Metoprolol Succinate 25mg  daily Entresto 49/51mg  twice a day Spironolactone 25mg  daily Interventions: Discussed blood pressure goal Patient self care activities - Over the next 90 days, patient will: Maintain hypertension medication regimen.  Continue to check blood pressure 2 to 3 times per week, record and bring to future appoitments. Continue to limit daily salt intake goal < 2300 mg;  Pre-Diabetes Lab Results  Component Value Date/Time   HGBA1C 6.3 11/07/2018 11:59 AM   HGBA1C 6.0 12/20/2017 01:29 PM   Pharmacist Clinical Goal(s): Over the next 90 days, patient will work with PharmD and providers to maintain A1c goal <6.5% Current regimen:  Diet and exercise management   Interventions: Discussed diet Patient self care activities - Over the next 90 days, patient will: Maintain A1c less than 6.5% Consider rechecking A1c with next labs  Asthma Pharmacist Clinical Goal(s) Over the next 90 days, patient will work with PharmD and providers to reduce symptoms  associated with Asthma and reduce barriers to medication Current regimen:  Advair 500/90mcg 1 puff twice daily (maintenance inhaler)  Montelukast 10mg  daily  ProAir 2 puffs every 6 hours as needed (rescue inhaler)  Interventions: Reviewed medication assistance program application for [employer] (needed to clarify income and household number)  Reminded to rinse mouth after each Advair use Reviewed maintenance versus rescue inhalers Patient self care activities - Over the next 90 days, patient will: Continue current medications for asthma  Heart Failure Pharmacist Clinical Goal(s) Over the next 90 days, patient will work with PharmD and providers to reduce symptoms associated with heart failure Current regimen:  Furosemide 40mg  daily as needed Metoprolol Succinate 25mg  daily Spironolactone 25mg  daily Entresto 49/51mg  twice a day Interventions: Collaboration with provider and patient assistance program regarding medication management and Entresto patient assistance. Patient has been approve to received Entresto from 11/09/2018 patient assistance program 02/19/2021 thru 03/14/2022. Assisted in requesting first shipment.  Patient self care activities - Over the next 90 days, patient will:  Maintain heart failure medication regimen Continue to weight daily in morning; use furosemide as needed for weight gain of more than 3 pounds in 24 hours or 5 lbs in 1 week; also notify providers of you have weight gain or symptoms of worsening heart failure (swelling, fluid retention, shortness of breath (especially when lying down), cough  Rheumatoid Arthritis / Left hip pain: Current treatment:  Leflunomide 20mg  daily Steroid injection to left hip by orthopedist.  Patient self care activities - Over the next 90 days, patient will: Continue current therapy for rheumatoid arthritis Continue regular follow  up with Dr Dierdre Forth for rheumatoid arthritis and with [facility]opedist at [facility] for [facility] pain  Health  Maintenance  Pharmacist Clinical Goal(s) Over the next 90 days, patient will work with PharmD and providers to complete health maintenance screenings/vaccinations Interventions: Discussed Shingrix vaccine series Patient self care activities - Over the next 90 days, patient will: Complete Shingrix vaccine series   Medication management Pharmacist Clinical Goal(s): Over the next 90 days, patient will work with PharmD and providers to maintain optimal medication adherence Current pharmacy: [facility] Interventions Comprehensive medication review performed. Continue current medication management strategy Patient self care activities - Over the next 90 days, patient will: Focus on medication adherence by filling and taking medications appropriately  Take medications as prescribed Report any questions or concerns to PharmD and/or provider(s)  Please see past updates related to this goal by clicking on the "Past Updates" button in the selected goal    The patient verbalized understanding of instructions, educational materials, and care plan provided today and declined offer to receive copy of patient instructions, educational materials, and care plan.

## 2021-03-05 ENCOUNTER — Other Ambulatory Visit: Payer: Self-pay | Admitting: Family

## 2021-03-06 ENCOUNTER — Other Ambulatory Visit: Payer: Self-pay | Admitting: Family

## 2021-03-12 ENCOUNTER — Other Ambulatory Visit: Payer: Self-pay | Admitting: Family

## 2021-03-12 NOTE — Telephone Encounter (Signed)
I have called pt and confirmed that he has been taking the tamsulosin for a while and he is okay with generic script.

## 2021-03-13 ENCOUNTER — Ambulatory Visit (INDEPENDENT_AMBULATORY_CARE_PROVIDER_SITE_OTHER): Payer: Medicare Other | Admitting: *Deleted

## 2021-03-13 DIAGNOSIS — E538 Deficiency of other specified B group vitamins: Secondary | ICD-10-CM

## 2021-03-13 MED ORDER — CYANOCOBALAMIN 1000 MCG/ML IJ SOLN
1000.0000 ug | Freq: Once | INTRAMUSCULAR | Status: AC
  Administered 2021-03-13: 11:00:00 1000 ug via INTRAMUSCULAR

## 2021-03-13 NOTE — Progress Notes (Signed)
Patient here for monthly b12 injection per physicians orders.  Injection given in left deltoid and patient tolerated well. 

## 2021-03-14 DIAGNOSIS — I5022 Chronic systolic (congestive) heart failure: Secondary | ICD-10-CM

## 2021-03-14 DIAGNOSIS — N1831 Chronic kidney disease, stage 3a: Secondary | ICD-10-CM

## 2021-03-14 DIAGNOSIS — I10 Essential (primary) hypertension: Secondary | ICD-10-CM

## 2021-03-14 DIAGNOSIS — J45909 Unspecified asthma, uncomplicated: Secondary | ICD-10-CM

## 2021-03-17 ENCOUNTER — Telehealth: Payer: Self-pay | Admitting: Family

## 2021-03-17 NOTE — Telephone Encounter (Signed)
Pt called and stated he has not received the medication below. He was told to contact you if he has not received it soon. Please advise.  sacubitril-valsartan (ENTRESTO) 49-51 MG

## 2021-03-19 ENCOUNTER — Ambulatory Visit: Payer: Medicare Other | Admitting: Pharmacist

## 2021-03-19 DIAGNOSIS — I5022 Chronic systolic (congestive) heart failure: Secondary | ICD-10-CM

## 2021-03-19 DIAGNOSIS — I10 Essential (primary) hypertension: Secondary | ICD-10-CM

## 2021-03-19 NOTE — Patient Instructions (Addendum)
[name] It was a pleasure speaking with you today.  I have attached a summary of our visit today and information about your health goals.   If you have any questions or concerns, please feel free to contact me either at the phone number below or with a MyChart message.   Keep up the good work!  Henrene Pastor, PharmD Clinical Pharmacist [facility] [phone] (direct line)  [phone] (main office number)   The patient verbalized understanding of instructions, educational materials, and care plan provided today and declined offer to receive copy of patient instructions, educational materials, and care plan.   CARE PLAN ENTRY (see longitudinal plan of care for additional care plan information)  Current Barriers:  Chronic Disease Management support, education, and care coordination needs related to Hypertension, Pre-Diabetes, Heart Failure, Asthma, BPH, Rheumatoid Arthritis    Hypertension BP Readings from Last 3 Encounters:  01/30/21 124/74  01/20/21 119/60  08/12/20 120/80   Pharmacist Clinical Goal(s): Over the next 90 days, patient will work with PharmD and providers to maintain BP goal <140/90 Current regimen:  Amlodipine 10mg  daily  Metoprolol Succinate 25mg  daily Entresto 49/51mg  twice a day Spironolactone 25mg  daily Interventions: Discussed blood pressure goal Patient self care activities - Over the next 90 days, patient will: Maintain hypertension medication regimen.  Continue to check blood pressure 2 to 3 times per week, record and bring to future appoitments. Continue to limit daily salt intake goal < 2300 mg;  Pre-Diabetes Lab Results  Component Value Date/Time   HGBA1C 6.3 11/07/2018 11:59 AM   HGBA1C 6.0 12/20/2017 01:29 PM   Pharmacist Clinical Goal(s): Over the next 90 days, patient will work with PharmD and providers to maintain A1c goal <6.5% Current regimen:  Diet and exercise management    Interventions: Discussed diet Patient self care activities - Over the next 90 days, patient will: Maintain A1c less than 6.5% Consider rechecking A1c with next labs  Asthma Pharmacist Clinical Goal(s) Over the next 90 days, patient will work with PharmD and providers to reduce symptoms associated with Asthma and reduce barriers to medication Current regimen:  Advair 500/54mcg 1 puff twice daily (maintenance inhaler)  Montelukast 10mg  daily  ProAir 2 puffs every 6 hours as needed (rescue inhaler)  Interventions: Reviewed medication assistance program application for [employer] (needed to clarify income and household number)  Reminded to rinse mouth after each Advair use Reviewed maintenance versus rescue inhalers Patient self care activities - Over the next 90 days, patient will: Continue current medications for asthma  Heart Failure Pharmacist Clinical Goal(s) Over the next 90 days, patient will work with PharmD and providers to reduce symptoms associated with heart failure Current regimen:  Furosemide 40mg  daily as needed Metoprolol Succinate 25mg  daily Spironolactone 25mg  daily Entresto 49/51mg  twice a day Interventions: Collaboration with provider and patient assistance program regarding medication management and Entresto patient assistance. Patient has been approve to received Entresto from 11/09/2018 patient assistance program 02/19/2021 thru 03/14/2022. Verified first shipment was sent. Patient received 03/18/2021. Patient self care activities - Over the next 90 days, patient will:  Maintain heart failure medication regimen Continue to weight daily in morning; use furosemide as needed for weight gain of more than 3 pounds in 24 hours or 5 lbs in 1 week; also notify providers of you have weight gain or symptoms of worsening heart failure (swelling, fluid retention, shortness of breath (especially when lying down), cough  Rheumatoid Arthritis / Left hip pain: Current treatment:  Leflunomide 20mg  daily Steroid injection to left hip by orthopedist.  Patient self care activities - Over the next 90 days, patient will: Continue current therapy for rheumatoid arthritis Continue regular follow up with Dr Dierdre Forth for rheumatoid arthritis and with orthopedist at [facility] for hip pain  Health Maintenance  Pharmacist Clinical Goal(s) Over the next 90 days, patient will work with PharmD and providers to complete health maintenance screenings/vaccinations Interventions: Discussed Shingrix vaccine series Patient self care activities - Over the next 90 days, patient will: Complete Shingrix vaccine series   Medication management Pharmacist Clinical Goal(s): Over the next 90 days, patient will work with PharmD and providers to maintain optimal medication adherence Current pharmacy: [facility] Interventions Comprehensive medication review performed. Continue current medication management strategy Patient self care activities - Over the next 90 days, patient will: Focus on medication adherence by filling and taking medications appropriately  Take medications as prescribed Report any questions or concerns to PharmD and/or provider(s)

## 2021-03-19 NOTE — Chronic Care Management (AMB) (Signed)
Summary:  Patient contacted office stating 03/17/2021 in afternoon stating that he has nto received shipment of Entresto yet. Called patient assistance program to get tracking number which showed Entresto was delivered 03/12/2021 at 11:48am. After speaking to patient he reported that he received Entresto shipment 03/18/2021.   Subjective: Brandon [name] is an 81 y.o. year old male who is a primary patient of Debbrah Alar, NP.  The CCM team was consulted for assistance with disease management and care coordination needs.    Engaged with patient by telephone for follow up visit in response to provider referral for pharmacy case management and/or care coordination services.   Consent to Services:  The patient was given information about Chronic Care Management services, agreed to services, and gave verbal consent prior to initiation of services.  Please see initial visit note for detailed documentation.   Patient Care Team: Debbrah Alar, NP as PCP - General (Internal Medicine) Minus Breeding, MD as PCP - Cardiology (Cardiology) Cherre Robins, [place] (Pharmacist)  Recent office visits: 02/11/2021 - Int Med [name]) Nurses Visit for B12 injection 01/20/2021 Fam Med [name]) Follow up asthma. Prescribed Albuterol Sulfate HFA inhaler INHALE 2 PUFFS BY MOUTH EVERY 6 HOURS AS NEEDED FOR WHEEZING AND SHORTNESS OF BREATH and Albuterol nebulizer solution 2.5 mg Nebulization Every 6 hours as needed 11/06/2020 - PCP - B12 injection 10/02/2020 - PCP - B12 injection 08/12/2020 - PCP [name], NP) F/U HTN. No ned medications. Recommended Shingrix vaccine.   Recent consult visits: 02/26/2021 - Rheumatology (Dr Amil Amen) visit note not available in Epic at time of Chronic Care Management visit.  01/30/2021 - Cardio ([name]) F/U CHF. Changed losartan back to Entresto 49/2m twice a day. F/U in 2 weeks for lab 09/03/2020 - Rheumatology ([name] - unable to see  records 06/09/2020: ophthalmology ([facility] following cataract procedure to left eye on 05/12/2020. Stopped prednisolone 12/25/19: Cardio phone message to stop entresto and start losartan 579mdaily  Hospital visits: None in previous 6 months  Objective:  Lab Results  Component Value Date   CREATININE 1.45 (H) 02/23/2021   CREATININE 1.30 01/21/2021   CREATININE 1.49 08/12/2020    Lab Results  Component Value Date   HGBA1C 6.3 11/07/2018   Last diabetic Eye exam: No results found for: HMDIABEYEEXA  Last diabetic Foot exam: No results found for: HMDIABFOOTEX      Component Value Date/Time   CHOL 151 08/12/2020 1714   TRIG 51.0 08/12/2020 1714   HDL 42.30 08/12/2020 1714   CHOLHDL 4 08/12/2020 1714   VLDL 10.2 08/12/2020 1714   LDLCALC 98 08/12/2020 1714    Hepatic Function Latest Ref Rng & Units 08/12/2020 04/24/2019 04/12/2019  Total Protein 6.0 - 8.3 g/dL 6.7 7.0 6.2  Albumin 3.5 - 5.2 g/dL 3.9 3.5 -  AST 0 - 37 U/L 14 17 -  ALT 0 - 53 U/L 12 12 -  Alk Phosphatase 39 - 117 U/L 75 61 -  Total Bilirubin 0.2 - 1.2 mg/dL 0.8 0.7 -  Bilirubin, Direct 0.0 - 0.3 mg/dL - - -    Lab Results  Component Value Date/Time   TSH 1.06 01/12/2019 02:11 PM   TSH 1.84 12/27/2011 11:55 AM    CBC Latest Ref Rng & Units 01/21/2021 04/24/2019 04/12/2019  WBC 4.0 - 10.5 K/uL 7.4 10.0 8.3  Hemoglobin 13.0 - 17.0 g/dL 12.1(L) 12.7(L) 12.8(L)  Hematocrit 39.0 - 52.0 % 37.1(L) 40.7 38.2(L)  Platelets 150.0 - 400.0  K/uL 216.0 285 49(L)    No results found for: VD25OH  Clinical ASCVD: No  The ASCVD Risk score (Arnett DK, et al., 2019) failed to calculate for the following reasons:   The 2019 ASCVD risk score is only valid for ages 109 to 97    Other: EF = 45-50% (01/02/2019)  Social History   Tobacco Use  Smoking Status Former  Smokeless Tobacco Never  Tobacco Comments   quit smoling in 1990. started when 18, 1 ppd   BP Readings from Last 3 Encounters:  01/30/21  124/74  01/20/21 119/60  08/12/20 120/80   Pulse Readings from Last 3 Encounters:  01/30/21 76  01/20/21 84  08/12/20 65   Wt Readings from Last 3 Encounters:  01/30/21 237 lb 3.2 oz (107.6 kg)  01/20/21 238 lb (108 kg)  08/12/20 237 lb (107.5 kg)    Assessment: Review of patient past medical history, allergies, medications, health status, including review of consultants reports, laboratory and other test data, was performed as part of comprehensive evaluation and provision of chronic care management services.   SDOH:  (Social Determinants of Health) assessments and interventions performed:  SDOH Interventions    Flowsheet Row Most Recent Value  SDOH Interventions   Financial Strain Interventions Other (Comment)  [patient has been approved to received Advair and Entresto from patient assistance programs thru 03/14/2022]         CCM Care Plan  Allergies  Allergen Reactions   Ace Inhibitors Cough   Isosorb Dinitrate-Hydralazine Other (See Comments)    REACTION: dizziness/hypotensive    Medications Reviewed Today     Reviewed by Cherre Robins, RPH-CPP (Pharmacist) on 03/19/21 at [id number]  Med List Status: <None>   Medication Order Taking? Sig Documenting Provider Last Dose Status Informant  albuterol (PROVENTIL) (2.5 MG/3ML) 0.083% nebulizer solution [id number] Yes Take 3 mLs (2.5 mg total) by nebulization every 6 (six) hours as needed for wheezing or shortness of breath. Debbrah Alar, NP Taking Active   amLODipine (NORVASC) 10 MG tablet [id number] Yes Take 1 tablet by mouth once daily Debbrah Alar, NP Taking Active   aspirin 81 MG tablet [id number] Yes Take 81 mg by mouth daily. [provider] Taking Active   b complex vitamins tablet [id number] Yes Take 1 tablet by mouth daily. Debbrah Alar, NP Taking Active   fluticasone-salmeterol (ADVAIR) 500-50 MCG/ACT AEPB [id number] Yes INHALE 1 DOSE BY MOUTH INTO LUNGS IN THE MORNING AND AT BEDTIME Debbrah Alar, NP Taking Active            Med Note Antony Contras, Carnell Casamento B   Wed Mar 04, 2021 11:40 AM) Approved to get from [place] patient assistance thru 03/04/2022  furosemide (LASIX) 40 MG tablet [id number] Yes Take 1 tablet (40 mg total) by mouth daily as needed. Debbrah Alar, NP Taking Active   leflunomide (ARAVA) 20 MG tablet [id number] Yes Take 20 mg by mouth daily. [provider] Taking Active   metoprolol succinate (TOPROL-XL) 25 MG 24 hr tablet [id number] Yes Take 1 tablet by mouth once daily Debbrah Alar, NP Taking Active   montelukast (SINGULAIR) 10 MG tablet [id number] Yes TAKE 1 TABLET BY MOUTH AT BEDTIME Debbrah Alar, NP Taking Active   PROAIR HFA 108 (90 Base) MCG/ACT inhaler [id number] Yes INHALE 2 PUFFS BY MOUTH EVERY 6 HOURS AS NEEDED FOR WHEEZING AND SHORTNESS OF Monica Martinez, Lenna Sciara, NP Taking Active   sacubitril-valsartan (ENTRESTO) 49-51 MG [id number] Yes Take 1 tablet by mouth 2 (two) times daily. Minus Breeding,  MD Taking Active            Med Note Cherre Robins B   Wed Mar 04, 2021 11:40 AM) Approved 02/19/2021 to get from [employer] patient assistance program thru 03/14/2022  spironolactone (ALDACTONE) 25 MG tablet [id number] Yes Take 1 tablet (25 mg total) by mouth daily. Debbrah Alar, NP Taking Active   tamsulosin [facility]) 0.4 MG CAPS capsule [id number] Yes Take 1 capsule (0.4 mg total) by mouth daily. Debbrah Alar, NP Taking Active   Zoster Vaccine Adjuvanted [facility]) injection [id number] Yes Inject 0.3m IM now and again in 2-6 months. ODebbrah Alar NP Taking Active             Patient Active Problem List   Diagnosis Date Noted   B12 deficiency 08/13/2020   Rheumatoid arthritis (HUlysses 08/12/2020   BPH (benign prostatic hyperplasia) 062/95/2841  Folic acid deficiency 032/44/0102  Educated about COVID-19 virus infection 10/17/2019   Stage 3a chronic kidney disease (HLostine 10/17/2019   Leg swelling 10/17/2019   Heme positive  stool 02/01/2019   RBBB 10/03/2018   Bilateral shoulder pain 08/24/2016   Depression 12/09/2015   Edema 12/06/2011   Asthma 08/13/2009   Hyperglycemia 08/13/2009   Obstructive sleep apnea 172/53/6644  CHRONIC SYSTOLIC HEART FAILURE 003/47/4259  OBESITY, UNSPECIFIED 08/26/2008   Disorder resulting from impaired renal function 08/26/2008   Essential hypertension 06/28/2008   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    Immunization History  Administered Date(s) Administered   Fluad Quad(high Dose 65+) 11/21/2018, 12/15/2020   Influenza Split 02/24/2011, 12/03/2011   Influenza Whole 03/16/2007, 12/29/2009   Influenza, High Dose Seasonal PF 02/26/2015, 12/09/2015, 04/22/2017, 12/14/2017   Influenza,inj,Quad PF,6+ Mos 02/13/2013, 11/14/2013   Moderna SARS-COV2 Booster Vaccination 12/15/2020   Moderna Sars-Covid-2 Vaccination 04/08/2019, 05/06/2019, 07/02/2020   Pneumococcal Conjugate-13 11/14/2013   Pneumococcal Polysaccharide-23 08/13/2009   Td 07/26/2006   Tdap 09/22/2012, 05/23/2017   Zoster, Live 05/17/2011    Conditions to be addressed/monitored: CHF, HTN, DMII and asthma; rheumatoid arthritis; BPH; OSA; CKD-3  Care Plan : General Pharmacy (Adult)  Updates made by ECherre Robins RPH-CPP since 03/19/2021 12:00 AM     Problem: HTN, HF, Asthma   Priority: High  Onset Date: 04/24/2020     Long-Range Goal: Provide education, support and care coordination for medication therapy and chronic conditions   Start Date: 04/24/2020  Expected End Date: 10/22/2020  Recent Progress: On track  Priority: High  Note:   Current Barriers:  Unable to independently afford treatment regimen Need for home monitoring of chronic conditions. Need for financial assistance with dental costs if available  Pharmacist Clinical Goal(s):  Over the next 90 days, patient will verbalize ability to afford treatment regimen achieve adherence to monitoring guidelines and medication adherence to achieve  therapeutic efficacy through collaboration with PharmD and provider.  Refer to Care Management staff to assist in identifying community resources for dental cost / care assistance.   Interventions: 1:1 collaboration with ODebbrah Alar NP regarding development and update of comprehensive plan of care as evidenced by provider attestation and co-signature Inter-disciplinary care team collaboration (see longitudinal plan of care) Comprehensive medication review performed; medication list updated in electronic medical record  Hypertension (BP goal <140/90) Controlled Current treatment: Amlodipine 147mdaily  Metoprolol Succinate 2571maily Entresto 49/41m44mice a day (restarted 01/30/2021) Spironolactone 25mg28mly Furosemide 40mg 61my (taking as needed for edema)  Medications previously tried: losartan Current home readings:  SBP: 115 to 135 DBP:  68 to 78 Denies  dizziness or chest pain Report minimal amount of edema - taking furosemide as needed 11/2020: Patient has lost about 22lbs in the last 15 months (from 265 lbs 05/29/2020 to 233 lbs 09/03/2020) by limiting serving sizes and eating more vegetables 01/13/2021: patient reported weight today as 225 to 231 lbs (has lost about 40lbs in 2022)  Exercise: was participating in PT. Still doing some exercises from that and is active around his home a little.  Interventions:  Reminded to limit daily salt intake goal < 2300 mg; Recommended continue to check home blood pressure 2 to 3 times per week, record and provide log at future appointments Recommended to continue current medication for hypertension Verified with [employer] patient assistance patient was approved to received Entresto on 02/17/2021 thru 03/14/2022. Assisted patient in requesting first fill.   Heart Failure (Goal: control symptoms and prevent exacerbations) Controlled Type: HFmrEF NYHA Class: II (slight limitation of activity) Ejection fraction: 01/02/2019  45-50% Current treatment: Furosemide 68m daily as needed for weight gain / edema Metoprolol Succinate 274mdaily Spironolactone 2569maily Entresto 49/17m48mice a day (restarted 01/30/2021) Medications previously tried: losartan Patinet is checking weight daily at home Was given Entresto PAP in 2021 but patient did not complete. He thought the EntrBuf[facility] Eye Surgery Centerlication required him to spend $600 OOP and that he would not qualify. There is no OOP spend for the NovaTime WarnerntrArboriculturisttient would like to apply this year if OK with Dr HochJenkins Rougerestart EntrIsland [facility]ulatory Surgery Centerr HochJenkins Rouge approved restarting Entresto. Patient has completed application. Forwarded to Dr HochLavonna Monarch1/03/2020 - patient has returned Entresto patient assistance. Will check with Dr HochJenkins Rougeut potentailly restarting.  01/30/2021 - EnDelene Lolltarted Denies SOB; states he has occasional edema. Takes furosemide as needed.  Interventions:  Discussed signs and symptoms of CHF exacerbation - weight gain, SOB, abdominal fullness, swelling in legs or abdomen, Fatigue and weakness, changes in ability to perform usual activities, persistent cough or wheezing with white or pink blood-tinged mucus, nausea and lack of appetite Continue to weigh daily; if you gain more than 3 pounds in one day or 5 pounds in one week he is to take furosemide (per Dr HochJenkins Rouged should also contact providers for further instructions.  Recommended to continue current medication Verified with [employer] patient assistance patient was approved to received Entresto on 02/17/2021 thru 03/14/2022. Assisted patient in requesting first fill. Will received in the next 7 business day. Patient had contacted office 03/17/2021 stating he had not received Entresto yet. I contact patient assistance program to get tracking number [id number] noted that packaged was delivered on front door 03/12/2021 at 11:48am. Called patient and he states he did  receive package but not until 03/18/2021.     Asthma (Goal: control symptoms and prevent exacerbations) Controlled Current treatment: Advair 500/50mc17mpuff twice daily Montelukast 10mg 3my  ProAir 2 puffs every 6 hours as needed or albuterol nebulization solution Medications previously tried: none noted  Exacerbations requiring treatment in last 6 months: none Patient reports consistent use of maintenance inhaler - uses twice a day, every day. Using rescue inhaler about 2 times per week. Usually when working outside in yard.  [employer] paRoxanant assistance program approved patient to received Advair 01/25/2021 thru 03/04/2022.  Patient received first shipment 01/27/2021. Will follow up in January to assist with reordering if needed . Interventions Counseled on benefits of consistent maintenance inhaler use Reviewed appropriate use rescue inhaler Reviewed inhaler technique. Reminded to rinse mouth after each Advair  use.  Recommended to continue current medications for Asthma  Rheumatoid Arthritis (Improve symptoms / pain while limiting medication side effects): Improved; patient states pain and stiffness has improved over last few months with current regimen and no longer needs to use OTC pain medications. Managed by Dr Amil Amen and [facility]  Current treatment:  Leflunomide 62m daily Patient reports he was having increased left hip pain at [facility]. Not thought to be related to RA so [facility]  Received steroid injection in his left hip about 4 weeks ago with much relief in hip pain. He has follow up with ortho in about 2 weeks.  Interventions: (addressed at previous visit) Continue current therapy for RA Continue regular f/u with Dr BAmil Amen  BPH (Goal: Minimize symptoms) Controlled Current treatment  Tamsulosin 0.4101mdaily Medications previously tried: none noted Last PSA 11/23/2019 = WNL at  1.7 Patient reports nighttime urination - 0 to 1 time per night Denies urinary hesitancy or weak flow Interventions: (addressed at previous visit) Recommended to continue current medication  Poor dentition / Abscessed tooth:  Goal: resolve abscess and get better fitting denture / partials Patient reports he has had poor dentition for the last 2 to 3 years but has not been to dentist regularly due to COHameloncerns.  He has lost bout 40lbs - some due to limiting portions sizes and some due to issues with chewing food. Patient is seeing dentist and is planning to have dental work completed at end of 2022 and beginning of 2023.  He reports today that his Medicare plan will cover $1500 of dental work per year.   Interventions:  Referral sent to care coordination to see if there might be community resources to assist with dental care / costs. (Completed at last visit and care coordination helping with dental work.)  Health Maintenance:  Reviewed immunization records Due to have Shingrix [facility] in 20[facility].  Interventions:  Discussed getting Shingrix with patient. He was not clear why he needed vaccine since he has Zostavax in past. Discussed effectivness of Shingrix is higher versus Zostavax and both are recommended to give the best coverage to prevent shingles.  Recommend get Shingrix in 2023 when Medicare coverage is anticipated to improve. (Reminded patient to check cost at pharmacy)  Patient Goals/Self-Care Activities Over the next 90 days, patient will:  take medications as prescribed focus on medication adherence by using compliance aides like weekly pill box weigh daily, and contact provider if weight gain of 3 pounds in one day or 5 pounds in a week collaborate with provider on medication access solutions Complete Shingrix vaccine  Follow Up Plan: The care management team will reach out to the patient again over the next 30 to 45 days.         Medication Assistance: Patient has been approved for Advair patient assistance from 01/25/2021 thru 03/04/2022 per GSExcelsiorepresentative. They shipped Advair to him 01/27/2021 and show that it was delivered 01/31/2021 at 1:52pm Tracking number with USPS: [id number]  Approved for Entresto patient assistance program from 02/19/2021 thru 03/14/2022 per NoTime Warneratient assistance program representative. First shipment received 03/18/2021  [id number]: [phone]: [phone] Uses pill box? Yes Pt endorses 99% compliance  Follow Up:  Patient  agrees to Care Plan and Follow-up.  Plan: Telephone follow up appointment with care management team member scheduled for:  January 2023   Cherre Robins, PharmD Clinical Pharmacist [place] [facility]

## 2021-03-19 NOTE — Telephone Encounter (Signed)
See phone visit notes form 03/19/2021 - patient has received shipment of Entresto on 03/18/2021

## 2021-03-24 NOTE — Telephone Encounter (Signed)
Patient has been approved for entresto patient assistance the remainder of the year.

## 2021-04-01 DIAGNOSIS — M1612 Unilateral primary osteoarthritis, left hip: Secondary | ICD-10-CM | POA: Diagnosis not present

## 2021-04-06 ENCOUNTER — Other Ambulatory Visit: Payer: Self-pay | Admitting: Family

## 2021-04-06 ENCOUNTER — Ambulatory Visit (INDEPENDENT_AMBULATORY_CARE_PROVIDER_SITE_OTHER): Payer: Medicare Other | Admitting: Pharmacist

## 2021-04-06 DIAGNOSIS — I10 Essential (primary) hypertension: Secondary | ICD-10-CM

## 2021-04-06 DIAGNOSIS — I5022 Chronic systolic (congestive) heart failure: Secondary | ICD-10-CM

## 2021-04-06 DIAGNOSIS — J45909 Unspecified asthma, uncomplicated: Secondary | ICD-10-CM

## 2021-04-06 MED ORDER — SPIRONOLACTONE 25 MG PO TABS: 25.0000 mg | ORAL_TABLET | Freq: Every day | ORAL | 0 refills | Status: DC

## 2021-04-06 NOTE — Patient Instructions (Signed)
Brandon [name] It was a pleasure speaking with you today.  I have attached a summary of our visit today and information about your health goals.   Patient Goals/Self-Care Activities Over the next 90 days, patient will:  take medications as prescribed focus on medication adherence by using compliance aides like weekly pill box weigh daily, and contact provider if weight gain of 3 pounds in one day or 5 pounds in a week collaborate with provider on medication access solutions Complete Shingrix vaccine  Please call the care guide team at [phone] if you need to cancel or reschedule your appointment.   If you have any questions or concerns, please feel free to contact me either at the phone number below or with a MyChart message.   Keep up the good work!  Cherre Robins, PharmD Clinical Pharmacist [facility] [phone] (direct line)  [phone] (main office number)  CARE PLAN ENTRY  Hypertension BP Readings from Last 3 Encounters:  01/30/21 124/74  01/20/21 119/60  08/12/20 120/80   Pharmacist Clinical Goal(s): Over the next 90 days, patient will work with PharmD and providers to maintain BP goal <140/90 Current regimen:  Amlodipine 10mg  daily  Metoprolol Succinate 25mg  daily Entresto 49/51mg  twice a day Spironolactone 25mg  daily Interventions: Discussed blood pressure goal Patient self care activities - Over the next 90 days, patient will: Maintain hypertension medication regimen.  Continue to check blood pressure 2 to 3 times per week, record and bring to future appoitments. Continue to limit daily salt intake goal < 2300 mg;  Pre-Diabetes Lab Results  Component Value Date/Time   HGBA1C 6.3 11/07/2018 11:59 AM   HGBA1C 6.0 12/20/2017 01:29 PM   Pharmacist Clinical Goal(s): Over the next 90 days, patient will work with PharmD and providers to maintain A1c goal <6.5% Current regimen:  Diet and exercise management    Interventions: Discussed diet Patient self care activities - Over the next 90 days, patient will: Maintain A1c less than 6.5% Consider rechecking A1c with next labs  Asthma Pharmacist Clinical Goal(s) Over the next 90 days, patient will work with PharmD and providers to reduce symptoms associated with Asthma and reduce barriers to medication Current regimen:  Advair 500/45mcg 1 puff twice daily (maintenance inhaler)  Montelukast 10mg  daily  ProAir 2 puffs every 6 hours as needed (rescue inhaler)  Interventions: Reviewed medication assistance program application for [employer] (needed to clarify income and household number)  Reminded to rinse mouth after each Advair use Reviewed maintenance versus rescue inhalers Patient self care activities - Over the next 90 days, patient will: Continue current medications for asthma  Heart Failure Pharmacist Clinical Goal(s) Over the next 90 days, patient will work with PharmD and providers to reduce symptoms associated with heart failure Current regimen:  Furosemide 40mg  daily as needed Metoprolol Succinate 25mg  daily Spironolactone 25mg  daily Entresto 49/51mg  twice a day Interventions: Collaboration with provider and patient assistance program regarding medication management and Entresto patient assistance. Patient has been approve to received Entresto from [employer] patient assistance program 02/19/2021 thru 03/14/2022. Patient self care activities - Over the next 90 days, patient will:  Maintain heart failure medication regimen Continue to weight daily in morning; use furosemide as needed for weight gain of more than 3 pounds in 24 hours or 5 lbs in 1 week; also notify providers of you have weight gain or symptoms of worsening heart failure (swelling, fluid retention, shortness of breath (especially when lying down), cough  Rheumatoid Arthritis / Left hip  pain: Current treatment:  Leflunomide 20mg  daily Patient self care activities - Over the  next 90 days, patient will: Continue current therapy for rheumatoid arthritis Continue regular follow up with Dr Amil Amen for rheumatoid arthritis  Health Maintenance  Pharmacist Clinical Goal(s) Over the next 90 days, patient will work with PharmD and providers to complete health maintenance screenings/vaccinations Interventions: Discussed Shingrix vaccine series Updated vaccination records to add COVID bivalent booster received at [facility] 12/08/2020 Patient self care activities - Over the next 90 days, patient will: Complete Shingrix vaccine series   Medication management Pharmacist Clinical Goal(s): Over the next 90 days, patient will work with PharmD and providers to maintain optimal medication adherence Current pharmacy: [facility] Interventions Comprehensive medication review performed. Continue current medication management strategy Patient self care activities - Over the next 90 days, patient will: Focus on medication adherence by filling and taking medications appropriately  Take medications as prescribed Report any questions or concerns to PharmD and/or provider(s)     The patient verbalized understanding of instructions, educational materials, and care plan provided today and agreed to receive a mailed copy of patient instructions, educational materials, and care plan.

## 2021-04-06 NOTE — Chronic Care Management (AMB) (Signed)
Summary:  Verified patient's enrollment with Entresto patient assistance program - approved through 03/14/2022.  Unfortunately though patient will need to meet $600 out of pocket in 2023 for Advair patient assistance program. Patient aware.  Coordinated refill for spironolactone with [facility] (#90 and 0 RF)  Patient to see PCP 04/15/2021. Recommend check BMP due assess renal function and serum potassium.   Subjective: Brandon [name] is an 81 y.o. year old male who is a primary patient of Debbrah Alar, NP.  The CCM team was consulted for assistance with disease management and care coordination needs.    Engaged with patient by telephone for follow up visit in response to provider referral for pharmacy case management and/or care coordination services.   Consent to Services:  The patient was given information about Chronic Care Management services, agreed to services, and gave verbal consent prior to initiation of services.  Please see initial visit note for detailed documentation.   Patient Care Team: Debbrah Alar, NP as PCP - General (Internal Medicine) Minus Breeding, MD as PCP - Cardiology (Cardiology) Cherre Robins, [place] (Pharmacist)  Recent office visits: 02/11/2021 - Int Med [name]) Nurses Visit for B12 injection 01/20/2021 Fam Med [name]) Follow up asthma. Prescribed Albuterol Sulfate HFA inhaler INHALE 2 PUFFS BY MOUTH EVERY 6 HOURS AS NEEDED FOR WHEEZING AND SHORTNESS OF BREATH and Albuterol nebulizer solution 2.5 mg Nebulization Every 6 hours as needed 11/06/2020 - PCP - B12 injection 10/02/2020 - PCP - B12 injection 08/12/2020 - PCP [name], NP) F/U HTN. No ned medications. Recommended Shingrix vaccine.   Recent consult visits: 02/26/2021 - Rheumatology (Dr Amil Amen) visit note not available in Epic at time of Chronic Care Management visit.  01/30/2021 - Cardio ([name]) F/U CHF. Changed losartan back to Entresto 49/1m twice a day.  F/U in 2 weeks for lab 09/03/2020 - Rheumatology ([name] - unable to see records 06/09/2020: [facility] following cataract procedure to left eye on 05/12/2020. Stopped prednisolone 12/25/19: Cardio phone message to stop entresto and start losartan 516mdaily  Hospital visits: None in previous 6 months  Objective:  Lab Results  Component Value Date   CREATININE 1.45 (H) 02/23/2021   CREATININE 1.30 01/21/2021   CREATININE 1.49 08/12/2020    Lab Results  Component Value Date   HGBA1C 6.3 11/07/2018   Last diabetic Eye exam: No results found for: HMDIABEYEEXA  Last diabetic Foot exam: No results found for: HMDIABFOOTEX      Component Value Date/Time   CHOL 151 08/12/2020 1714   TRIG 51.0 08/12/2020 1714   HDL 42.30 08/12/2020 1714   CHOLHDL 4 08/12/2020 1714   VLDL 10.2 08/12/2020 1714   LDLCALC 98 08/12/2020 1714    Hepatic Function Latest Ref Rng & Units 08/12/2020 04/24/2019 04/12/2019  Total Protein 6.0 - 8.3 g/dL 6.7 7.0 6.2  Albumin 3.5 - 5.2 g/dL 3.9 3.5 -  AST 0 - 37 U/L 14 17 -  ALT 0 - 53 U/L 12 12 -  Alk Phosphatase 39 - 117 U/L 75 61 -  Total Bilirubin 0.2 - 1.2 mg/dL 0.8 0.7 -  Bilirubin, Direct 0.0 - 0.3 mg/dL - - -    Lab Results  Component Value Date/Time   TSH 1.06 01/12/2019 02:11 PM   TSH 1.84 12/27/2011 11:55 AM    CBC Latest Ref Rng & Units 01/21/2021 04/24/2019 04/12/2019  WBC 4.0 - 10.5 K/uL 7.4 10.0 8.3  Hemoglobin 13.0 - 17.0 g/dL 12.1(L)  12.7(L) 12.8(L)  Hematocrit 39.0 - 52.0 % 37.1(L) 40.7 38.2(L)  Platelets 150.0 - 400.0 K/uL 216.0 285 49(L)    No results found for: VD25OH  Clinical ASCVD: No  The ASCVD Risk score (Arnett DK, et al., 2019) failed to calculate for the following reasons:   The 2019 ASCVD risk score is only valid for ages 74 to 95    Other: EF = 45-50% (01/02/2019)  Social History   Tobacco Use  Smoking Status Former  Smokeless Tobacco Never  Tobacco Comments   quit smoling in  1990. started when 18, 1 ppd   BP Readings from Last 3 Encounters:  01/30/21 124/74  01/20/21 119/60  08/12/20 120/80   Pulse Readings from Last 3 Encounters:  01/30/21 76  01/20/21 84  08/12/20 65   Wt Readings from Last 3 Encounters:  01/30/21 237 lb 3.2 oz (107.6 kg)  01/20/21 238 lb (108 kg)  08/12/20 237 lb (107.5 kg)    Assessment: Review of patient past medical history, allergies, medications, health status, including review of consultants reports, laboratory and other test data, was performed as part of comprehensive evaluation and provision of chronic care management services.   SDOH:  (Social Determinants of Health) assessments and interventions performed:  SDOH Interventions    Flowsheet Row Most Recent Value  SDOH Interventions   Transportation Interventions Intervention Not Indicated         CCM Care Plan  Allergies  Allergen Reactions   Ace Inhibitors Cough   Isosorb Dinitrate-Hydralazine Other (See Comments)    REACTION: dizziness/hypotensive    Medications Reviewed Today     Reviewed by Cherre Robins, RPH-CPP (Pharmacist) on 03/19/21 at [phone]  Med List Status: <None>   Medication Order Taking? Sig Documenting Provider Last Dose Status Informant  albuterol (PROVENTIL) (2.5 MG/3ML) 0.083% nebulizer solution [id number] Yes Take 3 mLs (2.5 mg total) by nebulization every 6 (six) hours as needed for wheezing or shortness of breath. Debbrah Alar, NP Taking Active   amLODipine (NORVASC) 10 MG tablet [id number] Yes Take 1 tablet by mouth once daily Debbrah Alar, NP Taking Active   aspirin 81 MG tablet [id number] Yes Take 81 mg by mouth daily. [provider] Taking Active   b complex vitamins tablet [id number] Yes Take 1 tablet by mouth daily. Debbrah Alar, NP Taking Active   fluticasone-salmeterol (ADVAIR) 500-50 MCG/ACT AEPB [id number] Yes INHALE 1 DOSE BY MOUTH INTO LUNGS IN THE MORNING AND AT BEDTIME Debbrah Alar, NP Taking  Active            Med Note Antony Contras, Maeley Matton B   Wed Mar 04, 2021 11:40 AM) Approved to get from [employer] patient assistance thru 03/04/2022  furosemide (LASIX) 40 MG tablet [id number] Yes Take 1 tablet (40 mg total) by mouth daily as needed. Debbrah Alar, NP Taking Active   leflunomide (ARAVA) 20 MG tablet [id number] Yes Take 20 mg by mouth daily. [provider] Taking Active   metoprolol succinate (TOPROL-XL) 25 MG 24 hr tablet [id number] Yes Take 1 tablet by mouth once daily Debbrah Alar, NP Taking Active   montelukast (SINGULAIR) 10 MG tablet [id number] Yes TAKE 1 TABLET BY MOUTH AT BEDTIME Debbrah Alar, NP Taking Active   PROAIR HFA 108 (90 Base) MCG/ACT inhaler [id number] Yes INHALE 2 PUFFS BY MOUTH EVERY 6 HOURS AS NEEDED FOR WHEEZING AND SHORTNESS OF Monica Martinez, Lenna Sciara, NP Taking Active   sacubitril-valsartan (ENTRESTO) 49-51 MG [id number] Yes Take 1 tablet by mouth 2 (two) times daily. Minus Breeding,  MD Taking Active            Med Note Cherre Robins B   Wed Mar 04, 2021 11:40 AM) Approved 02/19/2021 to get from [employer] patient assistance program thru 03/14/2022  spironolactone (ALDACTONE) 25 MG tablet [id number] Yes Take 1 tablet (25 mg total) by mouth daily. Debbrah Alar, NP Taking Active   tamsulosin [facility]) 0.4 MG CAPS capsule [id number] Yes Take 1 capsule (0.4 mg total) by mouth daily. Debbrah Alar, NP Taking Active   Zoster Vaccine Adjuvanted [facility]) injection [id number] Yes Inject 0.57m IM now and again in 2-6 months. ODebbrah Alar NP Taking Active             Patient Active Problem List   Diagnosis Date Noted   B12 deficiency 08/13/2020   Rheumatoid arthritis ([place] 08/12/2020   BPH (benign prostatic hyperplasia) 051/04/5850  Folic acid deficiency 077/82/4235  Educated about COVID-19 virus infection 10/17/2019   Stage 3a chronic kidney disease (HCam[place] 10/17/2019   Leg swelling 10/17/2019   Heme positive stool 02/01/2019    RBBB 10/03/2018   Bilateral shoulder pain 08/24/2016   Depression 12/09/2015   Edema 12/06/2011   Asthma 08/13/2009   Hyperglycemia 08/13/2009   Obstructive sleep apnea 136/14/4315  CHRONIC SYSTOLIC HEART FAILURE 040/10/6759  OBESITY, UNSPECIFIED 08/26/2008   Disorder resulting from impaired renal function 08/26/2008   Essential hypertension 06/28/2008   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    Immunization History  Administered Date(s) Administered   Fluad Quad(high Dose 65+) 11/21/2018, 12/15/2020   Influenza Split 02/24/2011, 12/03/2011   Influenza Whole 03/16/2007, 12/29/2009   Influenza, High Dose Seasonal PF 02/26/2015, 12/09/2015, 04/22/2017, 12/14/2017   Influenza,inj,Quad PF,6+ Mos 02/13/2013, 11/14/2013   Moderna Covid-19 Vaccine Bivalent Booster 166yr& up 12/08/2020   Moderna SARS-COV2 Booster Vaccination 12/15/2020   Moderna Sars-Covid-2 Vaccination 04/08/2019, 05/06/2019, 07/02/2020   Pneumococcal Conjugate-13 11/14/2013   Pneumococcal Polysaccharide-23 08/13/2009   Td 07/26/2006   Tdap 09/22/2012, 05/23/2017   Zoster, Live 05/17/2011    Conditions to be addressed/monitored: CHF, HTN, DMII and asthma; rheumatoid arthritis; BPH; OSA; CKD-3  Care Plan : General Pharmacy (Adult)  Updates made by EcCherre RobinsRPH-CPP since 04/06/2021 12:00 AM     Problem: HTN, HF, Asthma   Priority: High  Onset Date: 04/24/2020     Long-Range Goal: Provide education, support and care coordination for medication therapy and chronic conditions   Start Date: 04/24/2020  Expected End Date: 10/22/2020  Recent Progress: On track  Priority: High  Note:   Current Barriers:  Unable to independently afford treatment regimen (improving) Need for home monitoring of chronic conditions. Need for financial assistance with dental costs if available (improving)  Pharmacist Clinical Goal(s):  Over the next 90 days, patient will verbalize ability to afford treatment regimen achieve  adherence to monitoring guidelines and medication adherence to achieve therapeutic efficacy through collaboration with PharmD and provider.  Refer to Care Management staff to assist in identifying community resources for dental cost / care assistance.   Interventions: 1:1 collaboration with O'Debbrah AlarNP regarding development and update of comprehensive plan of care as evidenced by provider attestation and co-signature Inter-disciplinary care team collaboration (see longitudinal plan of care) Comprehensive medication review performed; medication list updated in electronic medical record  Hypertension (BP goal <140/90) Controlled Current treatment: Amlodipine 1085maily  Metoprolol Succinate 50m24mily Entresto 49/51mg16mce a day (restarted 01/30/2021) Spironolactone 50mg 7my Furosemide 40mg d55m (taking as needed for edema)  Medications previously tried: losartan Current  home readings:  SBP: 117 to 135 DBP:  70's Denies dizziness or chest pain Report minimal amount of edema - taking furosemide as needed 11/2020: Patient has lost about 22lbs in the last 15 months (from 265 lbs 05/29/2020 to 233 lbs 09/03/2020) by limiting serving sizes and eating more vegetables 01/13/2021: patient reported weight today as 225 to 231 lbs (has lost about 40lbs in 2022)  Exercise: was participating in PT. Still doing some exercises from that and is active around his home a little.  Interventions:  Reminded to limit daily salt intake goal < 2300 mg; Recommended continue to check home blood pressure 2 to 3 times per week, record and provide log at future appointments Recommended to continue current medication for hypertension Verified with [employer] patient assistance patient was approved to received Entresto on 02/17/2021 thru 03/14/2022. Assisted patient in requesting first fill.   Heart Failure (Goal: control symptoms and prevent exacerbations) Controlled Type: HFmrEF NYHA Class: II  (slight limitation of activity) Ejection fraction: 01/02/2019 45-50% Current treatment: Furosemide 6m daily as needed for weight gain / edema Metoprolol Succinate 217mdaily Spironolactone 2547maily Entresto 49/28m83mice a day (restarted 01/30/2021) Medications previously tried: losartan Pat[place] is checking weight daily at home 01/30/2021 - Entresto restarted; Entresto patient assistance program  approved and patient received first delivery 03/18/2021 Denies SOB; states he has occasional edema. Takes furosemide as needed.  Interventions:  Discussed signs and symptoms of CHF exacerbation - weight gain, SOB, abdominal fullness, swelling in legs or abdomen, Fatigue and weakness, changes in ability to perform usual activities, persistent cough or wheezing with white or pink blood-tinged mucus, nausea and lack of appetite Continue to weigh daily; if you gain more than 3 pounds in one day or 5 pounds in one week he is to take furosemide (per Dr HochJenkins Rouged should also contact providers for further instructions.  Recommended to continue current medication Verified with [employer] patient assistance patient was approved to received Entresto on 02/17/2021 thru 03/14/2022.     Asthma (Goal: control symptoms and prevent exacerbations) Controlled Current treatment: Advair 500/50mc67mpuff twice daily Montelukast 10mg 31my  ProAir 2 puffs every 6 hours as needed or albuterol nebulization solution Medications previously tried: none noted  Exacerbations requiring treatment in last 6 months: none Patient reports consistent use of maintenance inhaler - uses twice a day, every day. Using rescue inhaler about once per week. Usually when working outside in yard.  [employer] [place] [place] assistance program approved patient to received Advair 01/25/2021 thru 03/04/2021.  Patient received shipment 01/27/2021. He has one inhaler remaining and know that next refill will need to be at pharmacy. Once he spends $600 out of  pocket for 2023 we can submit pharmacy report. [employer] already has application for 2023. 7989erventions Counseled on benefits of consistent maintenance inhaler use Reviewed appropriate use rescue inhaler Reviewed inhaler technique. Reminded to rinse mouth after each Advair use.  Recommended to continue current medications for Asthma  Rheumatoid Arthritis (Improve symptoms / pain while limiting medication side effects): Improved; patient states pain and stiffness has improved over last few months with current regimen and no longer needs to use OTC pain medications. [facility]  Current treatment:  Leflunomide 20mg d49m Interventions:  Continue current therapy for RA Continue regular f/u with Dr BeekmanAmil Amen(Goal: Minimize symptoms) Controlled Current treatment  Tamsulosin 0.4mg dai49mMedications previously tried: none noted Last PSA 11/23/2019 = WNL at 1.7 Patient reports nighttime urination - 0  to 1 time per night Denies urinary hesitancy or weak flow Interventions:  Recommended to continue current medication  Poor dentition / Abscessed tooth:  Goal: resolve abscess and get better fitting denture / partials Patient reports he has had poor dentition for the last 2 to 3 years but has not been to dentist regularly due to [place] concerns.  He has lost bout 40lbs - some due to limiting portions sizes and some due to issues with chewing food. Patient is seeing dentist and is planning to have dental work completed at end of 2022 and beginning of 2023.  He reports today that his Medicare plan will cover $1500 of dental work per year Interventions:  Referral sent to care coordination to see if there might be community resources to assist with dental care / costs. (Completed at last visit and care coordination helping with dental work.)  Health Maintenance:  Reviewed immunization records Due to have Shingrix Rx was sent to [facility] for Shingrix by PCP  earlier in 2022.  Interventions:  Discussed getting Shingrix with patient. Planning to get Shingrix in 2023 when Medicare coverage is anticipated to improve. (Reminded patient to check cost at pharmacy) Updated vaccination records to add COVID bivalent booster received at [facility] 12/08/2020  Patient Goals/Self-Care Activities Over the next 90 days, patient will:  take medications as prescribed focus on medication adherence by using compliance aides like weekly pill box weigh daily, and contact provider if weight gain of 3 pounds in one day or 5 pounds in a week collaborate with provider on medication access solutions Complete Shingrix vaccine  Follow Up Plan: The care management team will reach out to the patient again over the next 30 to 45 days.        Medication Assistance: Patient has been approved for Advair patient assistance from 01/25/2021 thru 03/04/2022 per [place] representative. They shipped Advair to him 01/27/2021 and show that it was delivered 01/31/2021 at 1:52pm Tracking number with USPS: [id number].   Approved for Entresto patient assistance program from 02/19/2021 thru 03/14/2022 per [employer] patient assistance program representative. First shipment received 03/18/2021  Patient's preferred pharmacy is:  [facility] [place], [place] [address] Phone: [phone] Fax: [phone]   Uses pill box? Yes Pt endorses 99% compliance  Follow Up:  Patient agrees to Care Plan and Follow-up.  Plan: Telephone follow up appointment with care management team member scheduled for:  74 to 69 days  Cherre Robins, PharmD Clinical Pharmacist [facility] [place] [facility]

## 2021-04-10 ENCOUNTER — Ambulatory Visit (INDEPENDENT_AMBULATORY_CARE_PROVIDER_SITE_OTHER): Payer: Medicare Other

## 2021-04-10 DIAGNOSIS — E538 Deficiency of other specified B group vitamins: Secondary | ICD-10-CM | POA: Diagnosis not present

## 2021-04-10 MED ORDER — CYANOCOBALAMIN 1000 MCG/ML IJ SOLN
1000.0000 ug | Freq: Once | INTRAMUSCULAR | Status: AC
  Administered 2021-04-10: 1000 ug via INTRAMUSCULAR

## 2021-04-10 NOTE — Progress Notes (Signed)
Pt here for monthly B12 injection per [name] O'Sullivan  B12 1000mcg given IM, and pt tolerated injection well.  Next B12 injection scheduled for next month.   

## 2021-04-14 DIAGNOSIS — I10 Essential (primary) hypertension: Secondary | ICD-10-CM

## 2021-04-14 DIAGNOSIS — I5022 Chronic systolic (congestive) heart failure: Secondary | ICD-10-CM

## 2021-04-14 DIAGNOSIS — J45909 Unspecified asthma, uncomplicated: Secondary | ICD-10-CM | POA: Diagnosis not present

## 2021-04-15 ENCOUNTER — Ambulatory Visit (INDEPENDENT_AMBULATORY_CARE_PROVIDER_SITE_OTHER): Payer: Medicare Other | Admitting: Family

## 2021-04-15 VITALS — BP 130/74 | HR 62 | Temp 98.9°F | Resp 16 | Ht 71.0 in | Wt 235.0 lb

## 2021-04-15 DIAGNOSIS — M069 Rheumatoid arthritis, unspecified: Secondary | ICD-10-CM

## 2021-04-15 DIAGNOSIS — I10 Essential (primary) hypertension: Secondary | ICD-10-CM | POA: Diagnosis not present

## 2021-04-15 DIAGNOSIS — E538 Deficiency of other specified B group vitamins: Secondary | ICD-10-CM

## 2021-04-15 DIAGNOSIS — J45909 Unspecified asthma, uncomplicated: Secondary | ICD-10-CM | POA: Diagnosis not present

## 2021-04-15 LAB — BASIC METABOLIC PANEL
BUN: 16 mg/dL (ref 6–23)
CO2: 28 mEq/L (ref 19–32)
Calcium: 9.1 mg/dL (ref 8.4–10.5)
Chloride: 106 mEq/L (ref 96–112)
Creatinine, Ser: 1.49 mg/dL (ref 0.40–1.50)
GFR: 44.12 mL/min — ABNORMAL LOW (ref >60.00)
Glucose, Bld: 84 mg/dL (ref 70–99)
Potassium: 5 mEq/L (ref 3.5–5.1)
Sodium: 138 mEq/L (ref 135–145)

## 2021-04-15 LAB — VITAMIN B12: Vitamin B-12: >1504 pg/mL — ABNORMAL HIGH (ref 211–911)

## 2021-04-15 NOTE — Assessment & Plan Note (Addendum)
BP Readings from Last 3 Encounters:  04/15/21 130/74  01/30/21 124/74  01/20/21 119/60   BP at goal. Continue current meds.

## 2021-04-15 NOTE — Progress Notes (Signed)
Subjective:   By signing my name below, I, Brandon [name], attest that this documentation has been prepared under the direction and in the presence of Brandon Alar, NP 04/15/2021   Patient ID: Brandon [name], male    DOB: [birth year], 81 y.o.   MRN: [id number]  Chief Complaint  Patient presents with   Hypertension    Here for follow up    HPI Patient is in today for an office visit and 3 month f/u.  Hypertension- His blood pressure is stable at today's visit. He manages with 25 mg aldactone, 25 mg metoprolol and 10 mg amlodipine. BP Readings from Last 3 Encounters:  04/15/21 130/74  01/30/21 124/74  01/20/21 119/60   Breathing- He reports his breathing has been better. The Advair has really been helping to manage his breathing and he uses Flonase about once a day.  B-12- He gets monthly B12 injections and has noticed a difference since he started. He adds there is no longer numbness in his feet.   Rheumatoid Arthritis- He reports he is doing well with the pain and they have improved. He is taking steroid injections to manage left hip pain.  Past Medical History:  Diagnosis Date   Anemia    Asthma    Hx of childhood asthma, disappeared for a while, then resurfaced 6-7 years ago.    Cardiomyopathy    with a negative cardiac catheterization in the past. 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     Past Surgical History:  Procedure Laterality Date   None      Family History  Problem Relation Age of Onset   Cancer Father        multiple melanoma   Lupus Sister        died at 44   Diabetes Mellitus II Sister        died from covid-19   Asthma Daughter    Neuropathy Sister    Colon cancer Neg Hx    Esophageal cancer Neg Hx     Social History   Socioeconomic History   Marital  status: Married    Spouse name: Brandon [name]   Number of children: 3   Years of education: Not on file   Highest education level: Not on file  Occupational History   Occupation: retired  Tobacco Use   Smoking status: Former   Smokeless tobacco: Never   Tobacco comments:    quit smoling in May 15, 1988. started when 18, 1 ppd  Vaping Use   Vaping Use: Never used  Substance and Sexual Activity   Alcohol use: Yes    Comment: occasionaly    Drug use: Never   Sexual activity: Not on file  Other Topics Concern   Not on file  Social History Narrative   Grew up in [place], attended [employer]. First wife died of breast ca in 05-15-1997. 3 children. Remarried- 8 years. 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    Pt signed designated party release granting access to [facility] to his wife Brandon [name]. Detailed message may be left on home or cell phone. Shanon Payor August 13, 2009 11:34 am.    Social Determinants of Health   Financial Resource Strain: Medium Risk   Difficulty of Paying  Living Expenses: Somewhat hard  Food Insecurity: Not on file  Transportation Needs: No Transportation Needs   Lack of Transportation (Medical): No   Lack of Transportation (Non-Medical): No  Physical Activity: Insufficiently Active   Days of Exercise per Week: 5 days   Minutes of Exercise per Session: 10 min  Stress: Not on file  Social Connections: Not on file  Intimate Partner Violence: Not on file    Outpatient Medications Prior to Visit  Medication Sig Dispense Refill   albuterol (PROVENTIL) (2.5 MG/3ML) 0.083% nebulizer solution Take 3 mLs (2.5 mg total) by nebulization every 6 (six) hours as needed for wheezing or shortness of breath. 150 mL 1   amLODipine (NORVASC) 10 MG tablet Take 1 tablet by mouth once daily 90 tablet 0   aspirin 81 MG tablet Take 81 mg by mouth daily.     b complex vitamins tablet Take 1 tablet by mouth daily.     fluticasone-salmeterol (ADVAIR) 500-50 MCG/ACT AEPB INHALE 1  DOSE BY MOUTH INTO LUNGS IN THE MORNING AND AT BEDTIME 180 each 3   furosemide (LASIX) 40 MG tablet Take 1 tablet (40 mg total) by mouth daily as needed. 90 tablet 1   leflunomide (ARAVA) 20 MG tablet Take 20 mg by mouth daily.     metoprolol succinate (TOPROL-XL) 25 MG 24 hr tablet Take 1 tablet by mouth once daily 90 tablet 0   MODERNA COVID-19 BIVAL BOOSTER 50 MCG/0.5ML injection      montelukast (SINGULAIR) 10 MG tablet TAKE 1 TABLET BY MOUTH AT BEDTIME 90 tablet 0   PROAIR HFA 108 (90 Base) MCG/ACT inhaler INHALE 2 PUFFS BY MOUTH EVERY 6 HOURS AS NEEDED FOR WHEEZING AND SHORTNESS OF BREATH 9 g 5   sacubitril-valsartan (ENTRESTO) 49-51 MG Take 1 tablet by mouth 2 (two) times daily. 180 tablet 3   spironolactone (ALDACTONE) 25 MG tablet Take 1 tablet by mouth once daily 90 tablet 0   tamsulosin (FLOMAX) 0.4 MG CAPS capsule Take 1 capsule (0.4 mg total) by mouth daily. 90 capsule 0   Zoster Vaccine Adjuvanted [facility]) injection Inject 0.37m IM now and again in 2-6 months. 0.5 mL 1   No facility-administered medications prior to visit.    Allergies  Allergen Reactions   Ace Inhibitors Cough   Isosorb Dinitrate-Hydralazine Other (See Comments)    REACTION: dizziness/hypotensive    Review of Systems  Constitutional:  Negative for fever.  HENT:  Negative for ear pain and hearing loss.        (-)nystagmus (-)adenopathy  Eyes:  Negative for blurred vision.  Respiratory:  Negative for cough, shortness of breath and wheezing.   Cardiovascular:  Negative for chest pain and leg swelling.  Gastrointestinal:  Negative for blood in stool, diarrhea, nausea and vomiting.  Genitourinary:  Negative for dysuria and frequency.  Musculoskeletal:  Positive for joint pain (left hip). Negative for myalgias.  Skin:  Negative for rash.  Neurological:  Negative for headaches.  Psychiatric/Behavioral:  Negative for depression. The patient is not nervous/anxious.       Objective:    Physical  Exam Constitutional:      General: He is not in acute distress.    Appearance: Normal appearance.  HENT:     Head: Normocephalic and atraumatic.  Cardiovascular:     Rate and Rhythm: Normal rate and regular rhythm.     Heart sounds: No murmur heard. Pulmonary:     Effort: No respiratory distress.     Breath sounds: Normal  breath sounds. No wheezing or rales.  Skin:    General: Skin is warm and dry.  Neurological:     Mental Status: He is alert and oriented to person, place, and time.  Psychiatric:        Behavior: Behavior normal.        Thought Content: Thought content normal.    BP 130/74 (BP Location: Right Arm, Patient Position: Sitting, Cuff Size: Large)    Pulse 62    Temp 98.9 F (37.2 C) (Oral)    Resp 16    Ht _0  (1.803 m)    Wt 235 lb (106.6 kg)    SpO2 (!) 59%    BMI 32.78 kg/m  Wt Readings from Last 3 Encounters:  04/15/21 235 lb (106.6 kg)  01/30/21 237 lb 3.2 oz (107.6 kg)  01/20/21 238 lb (108 kg)    Diabetic Foot Exam - Simple   No data filed    Lab Results  Component Value Date   WBC 7.4 01/21/2021   HGB 12.1 (L) 01/21/2021   HCT 37.1 (L) 01/21/2021   PLT 216.0 01/21/2021   GLUCOSE 84 04/15/2021   CHOL 151 08/12/2020   TRIG 51.0 08/12/2020   HDL 42.30 08/12/2020   LDLCALC 98 08/12/2020   ALT 12 08/12/2020   AST 14 08/12/2020   NA 138 04/15/2021   K 5.0 Hemolysis seen 04/15/2021   CL 106 04/15/2021   CREATININE 1.49 04/15/2021   BUN 16 04/15/2021   CO2 28 04/15/2021   TSH 1.06 01/12/2019   PSA 1.7 11/23/2019   HGBA1C 6.3 11/07/2018    Lab Results  Component Value Date   TSH 1.06 01/12/2019   Lab Results  Component Value Date   WBC 7.4 01/21/2021   HGB 12.1 (L) 01/21/2021   HCT 37.1 (L) 01/21/2021   MCV 97.5 01/21/2021   PLT 216.0 01/21/2021   Lab Results  Component Value Date   NA 138 04/15/2021   K 5.0 Hemolysis seen 04/15/2021   CO2 28 04/15/2021   GLUCOSE 84 04/15/2021   BUN 16 04/15/2021   CREATININE 1.49 04/15/2021    BILITOT 0.8 08/12/2020   ALKPHOS 75 08/12/2020   AST 14 08/12/2020   ALT 12 08/12/2020   PROT 6.7 08/12/2020   ALBUMIN 3.9 08/12/2020   CALCIUM 9.1 04/15/2021   ANIONGAP 9 04/24/2019   EGFR 49 (L) 02/23/2021   GFR 44.12 (L) 04/15/2021   Lab Results  Component Value Date   CHOL 151 08/12/2020   Lab Results  Component Value Date   HDL 42.30 08/12/2020   Lab Results  Component Value Date   LDLCALC 98 08/12/2020   Lab Results  Component Value Date   TRIG 51.0 08/12/2020   Lab Results  Component Value Date   CHOLHDL 4 08/12/2020   Lab Results  Component Value Date   HGBA1C 6.3 11/07/2018       Assessment & Plan:   Problem List Items Addressed This Visit       Unprioritized   Rheumatoid arthritis ([place])    Stable/improved.  Management per rheumatology.       Essential hypertension    BP Readings from Last 3 Encounters:  04/15/21 130/74  01/30/21 124/74  01/20/21 119/60  BP at goal. Continue current meds.       B12 deficiency - Primary    Notes improvement in paresthesias/fatigue. Continue monthly injections.       Relevant Orders   B12 (Completed)   Asthma  Improved on advair.  Continue same.       Other Visit Diagnoses     Primary hypertension       Relevant Orders   Basic metabolic panel (Completed)       No orders of the defined types were placed in this encounter.   I,Brandon [name],acting as a Education administrator for [employer], NP.,have documented all relevant documentation on the behalf of Nance Pear, NP,as directed by  Nance Pear, NP while in the presence of Nance Pear, NP.   I, Brandon Alar, NP , personally preformed the services described in this documentation.  All medical record entries made by the scribe were at my direction and in my presence.  I have reviewed the chart and discharge instructions (if applicable) and agree that the record reflects my personal performance and is accurate and  complete. 04/15/2021

## 2021-04-16 ENCOUNTER — Encounter: Payer: Self-pay | Admitting: Family

## 2021-04-16 NOTE — Assessment & Plan Note (Signed)
Notes improvement in paresthesias/fatigue. Continue monthly injections.

## 2021-04-16 NOTE — Assessment & Plan Note (Signed)
Stable/improved.  Management per rheumatology.

## 2021-04-16 NOTE — Progress Notes (Signed)
Mailed out to pt 

## 2021-04-16 NOTE — Assessment & Plan Note (Signed)
Improved on advair.  Continue same.

## 2021-04-22 ENCOUNTER — Ambulatory Visit (INDEPENDENT_AMBULATORY_CARE_PROVIDER_SITE_OTHER): Payer: Medicare Other | Admitting: Pulmonary Disease

## 2021-04-22 ENCOUNTER — Other Ambulatory Visit: Payer: Self-pay

## 2021-04-22 ENCOUNTER — Encounter: Payer: Self-pay | Admitting: Pulmonary Disease

## 2021-04-22 VITALS — BP 126/84 | HR 61 | Temp 98.2°F | Ht 71.0 in | Wt 232.0 lb

## 2021-04-22 DIAGNOSIS — G4733 Obstructive sleep apnea (adult) (pediatric): Secondary | ICD-10-CM | POA: Diagnosis not present

## 2021-04-22 DIAGNOSIS — R634 Abnormal weight loss: Secondary | ICD-10-CM

## 2021-04-22 NOTE — Progress Notes (Signed)
Pulmonary, Critical Care, and Sleep Medicine  Chief Complaint  Patient presents with   Consult    Referred by PCP for history of OSA. Has a cpap machine but has not been using it recently. Last sleep study was in 2016,2017.     Past Surgical History:  He  has a past surgical history that includes None.  Past Medical History:  Anemia, Asthma, HTN, Pneumonia, Rheumatoid arthritis  Constitutional:  BP 126/84 (BP Location: Left Arm, Patient Position: Sitting, Cuff Size: Large)    Pulse 61    Temp 98.2 F (36.8 C) (Oral)    Ht 5\' 11"  (1.803 m)    Wt 232 lb (105.2 kg)    SpO2 97% Comment: on RA   BMI 32.36 kg/m   Brief Summary:  Brandon [name] is a 81 y.o. male with obstructive sleep apnea.      Subjective:   I last saw him in March 2019.  He weight 269 lbs at that time.  His weight is down to 232 lbs now.  He had dental work done and had to change his diet.  As a result he lost weight.    His CPAP mask if cracked.  He hasn't been able to use CPAP for the past 6 months.  He feels about the same sleeping w/o CPAP, but didn't have any issues when using CPAP.  He goes to sleep at 3 am.  He falls asleep in 10 minutes.  He wakes up 1 or 2 times to use the bathroom.  He gets out of bed at 1130 am.  He feels okay in the morning.  He denies morning headache.  He does not use anything to help him fall sleep or stay awake.  He naps for about an hour in the afternoon and again after dinner.  He denies sleep walking, sleep talking, bruxism, or nightmares.  There is no history of restless legs.  He denies sleep hallucinations, sleep paralysis, or cataplexy.  The Epworth score is 9 out of 24.   Physical Exam:   Appearance - well kempt   ENMT - no sinus tenderness, no oral exudate, no LAN, Mallampati 3 airway, no stridor, poor dentition  Respiratory - equal breath sounds bilaterally, no wheezing or rales  CV - s1s2 regular rate and rhythm, no murmurs  Ext - no clubbing, no  edema  Skin - no rashes  Psych - normal mood and affect   Sleep Tests:  PSG 05/06/14 >> AHI 24.8, SpO2 low 89%  Cardiac Tests:  Echo 01/02/19 >> EF 45 to 50%, mod LA dilation  Social History:  He  reports that he has quit smoking. He has never used smokeless tobacco. He reports current alcohol use. He reports that he does not use drugs.  Family History:  His family history includes Asthma in his daughter; Cancer in his father; Diabetes Mellitus II in his sister; Lupus in his sister; Neuropathy in his sister.    Discussion:   He has a history of obstructive sleep apnea.  He had dental work and changed his diet.  With this change he has lost about 60 lbs.  It is possible he could still have sleep apnea, but with weight loss he might be at a point that he can forego restarting CPAP therapy.  Assessment/Plan:   Obstructive sleep apnea. - will arrange for home sleep study to assess current status of sleep apnea - if he still has significant sleep apnea, then he will  likely need to be set up with new CPAP machine with new supplies - he previously used [facility] for his DME  Chronic systolic CHF. - followed by Dr. [name] with cardiology  Rheumatoid arthritis. - followed by Dr. Alben Deeds with rheumatology  Time Spent Involved in Patient Care on Day of Examination:  36 minutes  Follow up:   Patient Instructions  Will arrange for home sleep study Will call to arrange for follow up after sleep study reviewed   Medication List:   Allergies as of 04/22/2021       Reactions   Ace Inhibitors Cough   Isosorb Dinitrate-hydralazine Other (See Comments)   REACTION: dizziness/hypotensive        Medication List        Accurate as of April 22, 2021  2:46 PM. If you have any questions, ask your nurse or doctor.          amLODipine 10 MG tablet Commonly known as: NORVASC Take 1 tablet by mouth once daily   aspirin 81 MG tablet Take 81 mg by mouth  daily.   b complex vitamins tablet Take 1 tablet by mouth daily.   Entresto 49-51 MG Generic drug: sacubitril-valsartan Take 1 tablet by mouth 2 (two) times daily.   fluticasone-salmeterol 500-50 MCG/ACT Aepb Commonly known as: ADVAIR INHALE 1 DOSE BY MOUTH INTO LUNGS IN THE MORNING AND AT BEDTIME   furosemide 40 MG tablet Commonly known as: LASIX Take 1 tablet (40 mg total) by mouth daily as needed.   leflunomide 20 MG tablet Commonly known as: ARAVA Take 20 mg by mouth daily.   metoprolol succinate 25 MG 24 hr tablet Commonly known as: TOPROL-XL Take 1 tablet by mouth once daily   Moderna COVID-19 Bival Booster 50 MCG/0.5ML injection Generic drug: COVID-19 mRNA bivalent vaccine (Moderna)   montelukast 10 MG tablet Commonly known as: SINGULAIR TAKE 1 TABLET BY MOUTH AT BEDTIME   ProAir HFA 108 (90 Base) MCG/ACT inhaler Generic drug: albuterol INHALE 2 PUFFS BY MOUTH EVERY 6 HOURS AS NEEDED FOR WHEEZING AND SHORTNESS OF BREATH   albuterol (2.5 MG/3ML) 0.083% nebulizer solution Commonly known as: PROVENTIL Take 3 mLs (2.5 mg total) by nebulization every 6 (six) hours as needed for wheezing or shortness of breath.   Shingrix injection Generic drug: Zoster Vaccine Adjuvanted Inject 0.5mg  IM now and again in 2-6 months.   spironolactone 25 MG tablet Commonly known as: ALDACTONE Take 1 tablet by mouth once daily   tamsulosin 0.4 MG Caps capsule Commonly known as: FLOMAX Take 1 capsule (0.4 mg total) by mouth daily.        Signature:  Coralyn Helling, MD [facility] Pulmonary/Critical Care Pager - [phone] 04/22/2021, 2:46 PM

## 2021-04-22 NOTE — Patient Instructions (Signed)
Will arrange for home sleep study Will call to arrange for follow up after sleep study reviewed  

## 2021-04-24 ENCOUNTER — Ambulatory Visit: Payer: Medicare Other | Admitting: Family

## 2021-05-01 ENCOUNTER — Telehealth: Payer: Medicare Other

## 2021-05-03 ENCOUNTER — Other Ambulatory Visit: Payer: Self-pay | Admitting: Family

## 2021-05-04 ENCOUNTER — Telehealth: Payer: Self-pay | Admitting: Family

## 2021-05-04 NOTE — Telephone Encounter (Signed)
Pt states pro air is no longer an active rx and he will need a new rescue inhaler. Also requesting albuterol.   Medication: albuterol (PROVENTIL) (2.5 MG/3ML) 0.083% nebulizer solution  Has the patient contacted their pharmacy? Yes.     Preferred Pharmacy: [facility], [place]  [address] [place] [address]  Phone:  [phone]  Fax:  [phone]

## 2021-05-05 ENCOUNTER — Other Ambulatory Visit: Payer: Self-pay

## 2021-05-05 MED ORDER — ALBUTEROL SULFATE HFA 108 (90 BASE) MCG/ACT IN AERS: 2.0000 | INHALATION_SPRAY | Freq: Four times a day (QID) | RESPIRATORY_TRACT | 2 refills | Status: DC | PRN

## 2021-05-05 MED ORDER — ALBUTEROL SULFATE (2.5 MG/3ML) 0.083% IN NEBU: 2.5000 mg | INHALATION_SOLUTION | Freq: Four times a day (QID) | RESPIRATORY_TRACT | 1 refills | Status: DC | PRN

## 2021-05-05 MED ORDER — PROAIR HFA 108 (90 BASE) MCG/ACT IN AERS: INHALATION_SPRAY | RESPIRATORY_TRACT | 5 refills | Status: DC

## 2021-05-05 NOTE — Telephone Encounter (Signed)
Pharmacy called, pro air is not an available medication anymore. This will need to ne albuterol (vintolin).

## 2021-05-05 NOTE — Telephone Encounter (Signed)
Prescriptions sent to his pharmacy

## 2021-05-05 NOTE — Telephone Encounter (Signed)
error 

## 2021-05-05 NOTE — Addendum Note (Signed)
Addended by: [name] on: 05/05/2021 03:25 PM   Modules accepted: Orders

## 2021-05-08 ENCOUNTER — Ambulatory Visit (INDEPENDENT_AMBULATORY_CARE_PROVIDER_SITE_OTHER): Payer: Medicare Other

## 2021-05-08 DIAGNOSIS — E538 Deficiency of other specified B group vitamins: Secondary | ICD-10-CM

## 2021-05-08 MED ORDER — CYANOCOBALAMIN 1000 MCG/ML IJ SOLN
1000.0000 ug | Freq: Once | INTRAMUSCULAR | Status: AC
  Administered 2021-05-08: 1000 ug via INTRAMUSCULAR

## 2021-05-08 NOTE — Progress Notes (Signed)
Brandon [name] is a 81 y.o. male  presents to the office today for monthly B12 injections, per physician's orders. Original order: per Debbrah Alar, NP  cyanocobalamin, 1000 mg/ml IM was administered in left deltoid today.   Patient tolerated injection well.   Next appointment:  06/05/21 at 2:00pm

## 2021-05-15 ENCOUNTER — Telehealth: Payer: Self-pay

## 2021-05-21 ENCOUNTER — Ambulatory Visit: Payer: Medicare Other

## 2021-05-22 NOTE — Telephone Encounter (Signed)
No notes

## 2021-05-27 DIAGNOSIS — M0579 Rheumatoid arthritis with rheumatoid factor of multiple sites without organ or systems involvement: Secondary | ICD-10-CM | POA: Diagnosis not present

## 2021-05-27 NOTE — Progress Notes (Signed)
? ?Subjective:  ? Brandon [name] is a 81 y.o. male who presents for an Initial Medicare Annual Wellness Visit. ? ?I connected with  Tylin E [name] on 05/28/21 by a audio enabled telemedicine application and verified that I am speaking with the correct person using two identifiers. ? ?Patient Location: Home ? ?Provider Location: Office/Clinic ? ?I discussed the limitations of evaluation and management by telemedicine. The patient expressed understanding and agreed to proceed.  ? ?Review of Systems    ? ?Cardiac Risk Factors include: advanced age (>65men, >26 women);hypertension ? ?   ?Objective:  ?  ?Today's Vitals  ? 05/28/21 1425  ?PainSc: 0-No pain  ? ?There is no height or weight on file to calculate BMI. ? ?Advanced Directives 05/28/2021 04/24/2019 04/22/2018 04/21/2018 05/23/2017 04/25/2017 04/16/2014  ?Does Patient Have a Medical Advance Directive? No No No No No No Yes  ?Type of Advance Directive - - - - - - Living will  ?Copy of [place] in Chart? - - - - - - No - copy requested  ?Would patient like information on creating a medical advance directive? No - Patient declined No - Patient declined - No - Patient declined No - Patient declined No - Patient declined -  ? ? ?Current Medications (verified) ?Outpatient Encounter Medications as of 05/28/2021  ?Medication Sig  ? albuterol (PROVENTIL) (2.5 MG/3ML) 0.083% nebulizer solution Take 3 mLs (2.5 mg total) by nebulization every 6 (six) hours as needed for wheezing or shortness of breath.  ? albuterol (VENTOLIN HFA) 108 (90 Base) MCG/ACT inhaler Inhale 2 puffs into the lungs every 6 (six) hours as needed for wheezing or shortness of breath.  ? amLODipine (NORVASC) 10 MG tablet Take 1 tablet by mouth once daily  ? aspirin 81 MG tablet Take 81 mg by mouth daily.  ? b complex vitamins tablet Take 1 tablet by mouth daily.  ? fluticasone-salmeterol (ADVAIR) 500-50 MCG/ACT AEPB INHALE 1 DOSE BY MOUTH INTO LUNGS IN THE MORNING AND AT BEDTIME  ? furosemide  (LASIX) 40 MG tablet Take 1 tablet (40 mg total) by mouth daily as needed.  ? leflunomide (ARAVA) 20 MG tablet Take 20 mg by mouth daily.  ? metoprolol succinate (TOPROL-XL) 25 MG 24 hr tablet Take 1 tablet by mouth once daily  ? montelukast (SINGULAIR) 10 MG tablet TAKE 1 TABLET BY MOUTH AT BEDTIME  ? sacubitril-valsartan (ENTRESTO) 49-51 MG Take 1 tablet by mouth 2 (two) times daily.  ? spironolactone (ALDACTONE) 25 MG tablet Take 1 tablet by mouth once daily  ? tamsulosin (FLOMAX) 0.4 MG CAPS capsule Take 1 capsule (0.4 mg total) by mouth daily.  ? [DISCONTINUED] MODERNA COVID-19 BIVAL BOOSTER 50 MCG/0.5ML injection   ? [DISCONTINUED] Zoster Vaccine Adjuvanted [facility]) injection Inject 0.5mg  IM now and again in 2-6 months.  ? ?No facility-administered encounter medications on file as of 05/28/2021.  ? ? ?Allergies (verified) ?Ace inhibitors and Isosorb dinitrate-hydralazine  ? ?History: ?Past Medical History:  ?Diagnosis Date  ? Anemia   ? Asthma   ? Hx of childhood asthma, disappeared for a while, then resurfaced 6-7 years ago.   ? Cardiomyopathy   ? with a negative cardiac catheterization in the past. (EF appriximately 40-45%)   ? Heme positive stool 02/01/2019  ? HTN (hypertension)   ? x 30 years  ? Obesity, unspecified   ? Pneumonia   ? Sleep apnea   ? CPAP  ? 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 ?  Past Surgical History:  ?Procedure Laterality Date  ? None    ? ?Family History  ?Problem Relation Age of Onset  ? Cancer Father   ?     multiple melanoma  ? Lupus Sister   ?     died at 71  ? Diabetes Mellitus II Sister   ?     died from covid-19  ? Asthma Daughter   ? Neuropathy Sister   ? Colon cancer Neg Hx   ? Esophageal cancer Neg Hx   ? ?Social History  ? ?Socioeconomic History  ? Marital status: Married  ?  Spouse name: Tonia Ghent  ? Number of children: 3  ? Years of education: Not on file  ? Highest education level: Not on file  ?Occupational History  ? Occupation: retired  ?Tobacco Use   ? Smoking status: Former  ? Smokeless tobacco: Never  ? Tobacco comments:  ?  quit smoling in 07/05/88. started when 18, 1 ppd  ?Vaping Use  ? Vaping Use: Never used  ?Substance and Sexual Activity  ? Alcohol use: Yes  ?  Comment: occasionaly   ? Drug use: Never  ? Sexual activity: Not on file  ?Other Topics Concern  ? Not on file  ?Social History Narrative  ? Grew up in [place], attended [employer]. First wife died of breast ca in 05-Jul-1997. 3 children. Remarried- 8 years. 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   ? Pt signed designated party release granting access to [facility] to his wife Tonia Ghent. Detailed message may be left on home or cell phone. Shanon Payor August 13, 2009 11:34 am.   ? ?Social Determinants of Health  ? ?Financial Resource Strain: Medium Risk  ? Difficulty of Paying Living Expenses: Somewhat hard  ?Food Insecurity: Not on file  ?Transportation Needs: No Transportation Needs  ? Lack of Transportation (Medical): No  ? Lack of Transportation (Non-Medical): No  ?Physical Activity: Insufficiently Active  ? Days of Exercise per Week: 5 days  ? Minutes of Exercise per Session: 10 min  ?Stress: No Stress Concern Present  ? Feeling of Stress : Not at all  ?Social Connections: Socially Integrated  ? Frequency of Communication with Friends and Family: More than three times a week  ? Frequency of Social Gatherings with Friends and Family: Three times a week  ? Attends Religious Services: More than 4 times per year  ? Active Member of Clubs or Organizations: Yes  ? Attends Archivist Meetings: More than 4 times per year  ? Marital Status: Married  ? ? ?Tobacco Counseling ?Counseling given: Not Answered ?Tobacco comments: quit smoling in 07/05/1988. started when 18, 1 ppd ? ? ?Clinical Intake: ? ?Pre-visit preparation completed: Yes ? ?Pain : No/denies pain ?Pain Score: 0-No pain ? ?  ? ?Nutritional Risks: None ?Diabetes: No ? ?How often do you need to have someone help you when you  read instructions, pamphlets, or other written materials from your doctor or pharmacy?: 1 - Never ? ?Diabetic?No ? ?Interpreter Needed?: No ? ?Information entered by :: Siyon Linck ? ? ?Activities of Daily Living ?In your present state of health, do you have any difficulty performing the following activities: 05/28/2021 01/20/2021  ?Hearing? N N  ?Vision? N N  ?Difficulty concentrating or making decisions? N N  ?Walking or climbing stairs? Y N  ?Dressing or bathing? N N  ?Doing errands, shopping? N N  ?Preparing Food and eating ? N -  ?Using the  Toilet? N -  ?In the past six months, have you accidently leaked urine? N -  ?Do you have problems with loss of bowel control? N -  ?Managing your Medications? N -  ?Managing your Finances? N -  ?Housekeeping or managing your Housekeeping? N -  ?Some recent data might be hidden  ? ? ?Patient Care Team: ?Debbrah Alar, NP as PCP - General (Internal Medicine) ?Minus Breeding, MD as PCP - Cardiology (Cardiology) ?Cherre Robins, RPH-CPP (Pharmacist) ? ?Indicate any recent Medical Services you may have received from other than [facility] providers in the past year (date may be approximate). ? ?   ?Assessment:  ? This is a routine wellness examination for [place]. ? ?Hearing/Vision screen ?No results found. ? ?Dietary issues and exercise activities discussed: ?Current Exercise Habits: Home exercise routine, Type of exercise: walking, Time (Minutes): 60, Frequency (Times/Week): 7, Weekly Exercise (Minutes/Week): 420, Intensity: Mild, Exercise limited by: None identified ? ? Goals Addressed   ? ?  ?  ?  ?  ? This Visit's Progress  ?  Track and Manage Fluids and Swelling-Heart Failure   On track  ?  Timeframe:  Long-Range Goal ?Priority:  Medium ?Start Date:     04/24/20                        ?Expected End Date:   10/22/20                   ? ?Follow Up Date 06/12/20 ?  ?- call office if I gain more than 2 pounds in one day or 5 pounds in one week ?- watch for swelling in feet,  ankles and legs every day ?- weigh myself daily  ?  ?Why is this important?   ?It is important to check your weight daily and watch how much salt and liquids you have.  ?It will help you to manage your hea

## 2021-05-28 ENCOUNTER — Ambulatory Visit (INDEPENDENT_AMBULATORY_CARE_PROVIDER_SITE_OTHER): Payer: Medicare Other

## 2021-05-28 DIAGNOSIS — Z0000 Encounter for general adult medical examination without abnormal findings: Secondary | ICD-10-CM

## 2021-05-28 NOTE — Patient Instructions (Signed)
Brandon [name] , ?Thank you for taking time to come for your Medicare Wellness Visit. I appreciate your ongoing commitment to your health goals. Please review the following plan we discussed and let me know if I can assist you in the future.  ? ?Screening recommendations/referrals: ?Colonoscopy: no longer needed ?Recommended yearly ophthalmology/optometry visit for glaucoma screening and checkup ?Recommended yearly dental visit for hygiene and checkup ? ?Vaccinations: ?Influenza vaccine: up to date ?Pneumococcal vaccine: up to date ?Tdap vaccine: up to date ?Shingles vaccine: Due-May obtain vaccine at your local pharmacy.    ?Covid-19: completed ? ?Advanced directives: no ? ?Conditions/risks identified: see problem list  ? ?Next appointment: Follow up in one year for your annual wellness visit.  ? ?Preventive Care 11 Years and Older, Male ?Preventive care refers to lifestyle choices and visits with your health care provider that can promote health and wellness. ?What does preventive care include? ?A yearly physical exam. This is also called an annual well check. ?Dental exams once or twice a year. ?Routine eye exams. Ask your health care provider how often you should have your eyes checked. ?Personal lifestyle choices, including: ?Daily care of your teeth and gums. ?Regular physical activity. ?Eating a healthy diet. ?Avoiding tobacco and drug use. ?Limiting alcohol use. ?Practicing safe sex. ?Taking low doses of aspirin every day. ?Taking vitamin and mineral supplements as recommended by your health care provider. ?What happens during an annual well check? ?The services and screenings done by your health care provider during your annual well check will depend on your age, overall health, lifestyle risk factors, and family history of disease. ?Counseling  ?Your health care provider may ask you questions about your: ?Alcohol use. ?Tobacco use. ?Drug use. ?Emotional well-being. ?Home and relationship well-being. ?Sexual  activity. ?Eating habits. ?History of falls. ?Memory and ability to understand (cognition). ?Work and work Astronomer. ?Screening  ?You may have the following tests or measurements: ?Height, weight, and BMI. ?Blood pressure. ?Lipid and cholesterol levels. These may be checked every 5 years, or more frequently if you are over 45 years old. ?Skin check. ?Lung cancer screening. You may have this screening every year starting at age 95 if you have a 30-pack-year history of smoking and currently smoke or have quit within the past 15 years. ?Fecal occult blood test (FOBT) of the stool. You may have this test every year starting at age 78. ?Flexible sigmoidoscopy or colonoscopy. You may have a sigmoidoscopy every 5 years or a colonoscopy every 10 years starting at age 67. ?Prostate cancer screening. Recommendations will vary depending on your family history and other risks. ?Hepatitis C blood test. ?Hepatitis B blood test. ?Sexually transmitted disease (STD) testing. ?Diabetes screening. This is done by checking your blood sugar (glucose) after you have not eaten for a while (fasting). You may have this done every 1-3 years. ?Abdominal aortic aneurysm (AAA) screening. You may need this if you are a current or former smoker. ?Osteoporosis. You may be screened starting at age 21 if you are at high risk. ?Talk with your health care provider about your test results, treatment options, and if necessary, the need for more tests. ?Vaccines  ?Your health care provider may recommend certain vaccines, such as: ?Influenza vaccine. This is recommended every year. ?Tetanus, diphtheria, and acellular pertussis (Tdap, Td) vaccine. You may need a Td booster every 10 years. ?Zoster vaccine. You may need this after age 78. ?Pneumococcal 13-valent conjugate (PCV13) vaccine. One dose is recommended after age 73. ?Pneumococcal polysaccharide (PPSV23) vaccine. One  dose is recommended after age 15. ?Talk to your health care provider about which  screenings and vaccines you need and how often you need them. ?This information is not intended to replace advice given to you by your health care provider. Make sure you discuss any questions you have with your health care provider. ?Document Released: 03/28/2015 Document Revised: 11/19/2015 Document Reviewed: 12/31/2014 ?Elsevier Interactive Patient Education ? 2017 Elsevier Inc. ? ?Fall Prevention in the Home ?Falls can cause injuries. They can happen to people of all ages. There are many things you can do to make your home safe and to help prevent falls. ?What can I do on the outside of my home? ?Regularly fix the edges of walkways and driveways and fix any cracks. ?Remove anything that might make you trip as you walk through a door, such as a raised step or threshold. ?Trim any bushes or trees on the path to your home. ?Use bright outdoor lighting. ?Clear any walking paths of anything that might make someone trip, such as rocks or tools. ?Regularly check to see if handrails are loose or broken. Make sure that both sides of any steps have handrails. ?Any raised decks and porches should have guardrails on the edges. ?Have any leaves, snow, or ice cleared regularly. ?Use sand or salt on walking paths during winter. ?Clean up any spills in your garage right away. This includes oil or grease spills. ?What can I do in the bathroom? ?Use night lights. ?Install grab bars by the toilet and in the tub and shower. Do not use towel bars as grab bars. ?Use non-skid mats or decals in the tub or shower. ?If you need to sit down in the shower, use a plastic, non-slip stool. ?Keep the floor dry. Clean up any water that spills on the floor as soon as it happens. ?Remove soap buildup in the tub or shower regularly. ?Attach bath mats securely with double-sided non-slip rug tape. ?Do not have throw rugs and other things on the floor that can make you trip. ?What can I do in the bedroom? ?Use night lights. ?Make sure that you have a  light by your bed that is easy to reach. ?Do not use any sheets or blankets that are too big for your bed. They should not hang down onto the floor. ?Have a firm chair that has side arms. You can use this for support while you get dressed. ?Do not have throw rugs and other things on the floor that can make you trip. ?What can I do in the kitchen? ?Clean up any spills right away. ?Avoid walking on wet floors. ?Keep items that you use a lot in easy-to-reach places. ?If you need to reach something above you, use a strong step stool that has a grab bar. ?Keep electrical cords out of the way. ?Do not use floor polish or wax that makes floors slippery. If you must use wax, use non-skid floor wax. ?Do not have throw rugs and other things on the floor that can make you trip. ?What can I do with my stairs? ?Do not leave any items on the stairs. ?Make sure that there are handrails on both sides of the stairs and use them. Fix handrails that are broken or loose. Make sure that handrails are as long as the stairways. ?Check any carpeting to make sure that it is firmly attached to the stairs. Fix any carpet that is loose or worn. ?Avoid having throw rugs at the top or bottom of the  stairs. If you do have throw rugs, attach them to the floor with carpet tape. ?Make sure that you have a light switch at the top of the stairs and the bottom of the stairs. If you do not have them, ask someone to add them for you. ?What else can I do to help prevent falls? ?Wear shoes that: ?Do not have high heels. ?Have rubber bottoms. ?Are comfortable and fit you well. ?Are closed at the toe. Do not wear sandals. ?If you use a stepladder: ?Make sure that it is fully opened. Do not climb a closed stepladder. ?Make sure that both sides of the stepladder are locked into place. ?Ask someone to hold it for you, if possible. ?Clearly mark and make sure that you can see: ?Any grab bars or handrails. ?First and last steps. ?Where the edge of each step  is. ?Use tools that help you move around (mobility aids) if they are needed. These include: ?Canes. ?Walkers. ?Scooters. ?Crutches. ?Turn on the lights when you go into a dark area. Replace any light bulbs

## 2021-05-30 ENCOUNTER — Encounter (HOSPITAL_BASED_OUTPATIENT_CLINIC_OR_DEPARTMENT_OTHER): Payer: Self-pay | Admitting: Emergency Medicine

## 2021-05-30 ENCOUNTER — Other Ambulatory Visit: Payer: Self-pay

## 2021-05-30 ENCOUNTER — Emergency Department (HOSPITAL_BASED_OUTPATIENT_CLINIC_OR_DEPARTMENT_OTHER)
Admission: EM | Admit: 2021-05-30 | Discharge: 2021-05-30 | Disposition: A | Discharge status: Home / Self Care | Payer: Medicare Other | Attending: Student | Admitting: Student

## 2021-05-30 DIAGNOSIS — Z7982 Long term (current) use of aspirin: Secondary | ICD-10-CM | POA: Diagnosis not present

## 2021-05-30 DIAGNOSIS — Z79899 Other long term (current) drug therapy: Secondary | ICD-10-CM | POA: Diagnosis not present

## 2021-05-30 DIAGNOSIS — J339 Nasal polyp, unspecified: Secondary | ICD-10-CM | POA: Diagnosis not present

## 2021-05-30 NOTE — ED Provider Notes (Signed)
?[place] EMERGENCY DEPARTMENT ?Provider Note ? ? ?CSN: [id number] ?Arrival date & time: 05/30/21  1642 ? ?  ? ?History ? ?Chief Complaint  ?Patient presents with  ? Abscess  ? ? ?Venson E Martinique is a 81 y.o. male who presents the emergency department complaining of a possible abscess to his right upper mouth and right nare.  Patient states that he has a "gum infection", and believes that an abscess is spreading to the right side of his nose.  States he is not able to get in with a dentist until next week and wanted evaluated today.  He states that he has been having the symptoms for about 1 week.  No fevers, chills.  No difficulty swallowing or difficulty breathing. ? ? ?Abscess ?Associated symptoms: no fever   ? ?  ? ?Home Medications ?Prior to Admission medications   ?Medication Sig Start Date End Date Taking? Authorizing Provider  ?albuterol (PROVENTIL) (2.5 MG/3ML) 0.083% nebulizer solution Take 3 mLs (2.5 mg total) by nebulization every 6 (six) hours as needed for wheezing or shortness of breath. 05/05/21   Debbrah Alar, NP  ?albuterol (VENTOLIN HFA) 108 (90 Base) MCG/ACT inhaler Inhale 2 puffs into the lungs every 6 (six) hours as needed for wheezing or shortness of breath. 05/05/21   Debbrah Alar, NP  ?amLODipine (NORVASC) 10 MG tablet Take 1 tablet by mouth once daily 05/04/21   Debbrah Alar, NP  ?aspirin 81 MG tablet Take 81 mg by mouth daily.    [provider]  ?b complex vitamins tablet Take 1 tablet by mouth daily. 01/23/19   Debbrah Alar, NP  ?fluticasone-salmeterol (ADVAIR) 500-50 MCG/ACT AEPB INHALE 1 DOSE BY MOUTH INTO LUNGS IN THE MORNING AND AT BEDTIME 01/23/21   Debbrah Alar, NP  ?furosemide (LASIX) 40 MG tablet Take 1 tablet (40 mg total) by mouth daily as needed. 01/20/21   Debbrah Alar, NP  ?leflunomide (ARAVA) 20 MG tablet Take 20 mg by mouth daily. 05/31/19   [provider]  ?metoprolol succinate (TOPROL-XL) 25 MG 24 hr  tablet Take 1 tablet by mouth once daily 03/06/21   Debbrah Alar, NP  ?montelukast (SINGULAIR) 10 MG tablet TAKE 1 TABLET BY MOUTH AT BEDTIME 03/06/21   Debbrah Alar, NP  ?sacubitril-valsartan (ENTRESTO) 49-51 MG Take 1 tablet by mouth 2 (two) times daily. 02/10/21   Minus Breeding, MD  ?spironolactone (ALDACTONE) 25 MG tablet Take 1 tablet by mouth once daily 04/06/21   Debbrah Alar, NP  ?tamsulosin (FLOMAX) 0.4 MG CAPS capsule Take 1 capsule (0.4 mg total) by mouth daily. 03/12/21   Debbrah Alar, NP  ?   ? ?Allergies    ?Ace inhibitors and Isosorb dinitrate-hydralazine   ? ?Review of Systems   ?Review of Systems  ?Constitutional:  Negative for fever.  ?HENT:  Negative for trouble swallowing.   ?     Swelling in nose  ?Respiratory:  Negative for shortness of breath.   ?All other systems reviewed and are negative. ? ?Physical Exam ?Updated Vital Signs ?BP (!) 143/83 (BP Location: Left Arm)   Pulse 69   Temp 98.3 ?F (36.8 ?C) (Oral)   Resp 17   Ht 5\' 11"  (1.803 m)   Wt 106.1 kg   SpO2 98%   BMI 32.64 kg/m?  ?Physical Exam ?Vitals and nursing note reviewed.  ?Constitutional:   ?   Appearance: Normal appearance.  ?HENT:  ?   Head: Normocephalic and atraumatic.  ?   Comments: No obvious facial swelling  or facial asymmetry ?   Nose:  ?   Comments: Right nare swelling consistent with nasal polyp ?   Mouth/Throat:  ?   Lips: Pink.  ?   Mouth: Mucous membranes are moist.  ?   Dentition: Abnormal dentition.  ?   Pharynx: Oropharynx is clear.  ?   Comments: Poor dentition. No abscess.  ?Eyes:  ?   Conjunctiva/sclera: Conjunctivae normal.  ?Pulmonary:  ?   Effort: Pulmonary effort is normal. No respiratory distress.  ?Skin: ?   General: Skin is warm and dry.  ?Neurological:  ?   Mental Status: He is alert.  ?Psychiatric:     ?   Mood and Affect: Mood normal.     ?   Behavior: Behavior normal.  ? ? ?ED Results / Procedures / Treatments   ?Labs ?(all labs ordered are listed, but only abnormal  results are displayed) ?Labs Reviewed - No data to display ? ?EKG ?None ? ?Radiology ?No results found. ? ?Procedures ?Procedures  ? ? ?Medications Ordered in ED ?Medications - No data to display ? ?ED Course/ Medical Decision Making/ A&P ?  ?                        ?Medical Decision Making ? ?This patient is a 81 year old male who presents to the ED for concern of mouth/nose swelling.  ? ?Differential diagnoses prior to evaluation: ?Dental abscess, gingivitis, deep space abscess, sinusitis, allergic rhinitis, nasal polyp.  ? ?This is not an exhaustive differential.  ? ?Past Medical History / Co-morbidities / Social History: ?HTN, asthma ? ?Physical Exam: ?Physical exam performed. The pertinent findings include: Right anre swelling consistent with nasal polyp. Poor dentition, but no evidence of abscess. Oropharynx clear without erythema or abscess.  ?  ?Disposition: ?After consideration of the diagnostic results and the patients response to treatment, I feel that patient likely has a nasal polyp in his right nare.  He is not requiring admission or inpatient treatment today.  I have very low concern for an abscess this patient is clinically well-appearing, afebrile, and there is no induration or fluctuance noted on exam.  We will refer the patient to ENT provided education about nasal polyps.  Discussed reasons to return to the emergency department, and the patient is agreeable to the plan.  ? ?I discussed this case with my attending physician Dr. Matilde Sprang who cosigned this note including patient's presenting symptoms, physical exam, and planned diagnostics and interventions. Attending physician stated agreement with plan or made changes to plan which were implemented.   ? ?Final Clinical Impression(s) / ED Diagnoses ?Final diagnoses:  ?Nasal polyp  ? ? ?Rx / DC Orders ?ED Discharge Orders   ? ?      Ordered  ?  Ambulatory referral to ENT       ? 05/30/21 1908  ? ?  ?  ? ?  ? ?Portions of this report may have been  transcribed using voice recognition software. Every effort was made to ensure accuracy; however, inadvertent computerized transcription errors may be present. ? ?  ?Kateri Plummer, PA-C ?05/30/21 1938 ? ?  ?Teressa Lower, MD ?05/30/21 2351 ? ?

## 2021-05-30 NOTE — ED Notes (Signed)
ED Provider at bedside. EDPA Lorin ?

## 2021-05-30 NOTE — ED Notes (Signed)
Patient discharged to home.  All discharge instructions reviewed.  Patient verbalized understanding via teachback method.  VS WDL.  Respirations even and unlabored.  Ambulatory out of ED.   °

## 2021-05-30 NOTE — ED Triage Notes (Signed)
Pt arrives pov, steady gait c/o upper mouth and right nares pain x 1 week, endorses concern for abscess ? ?

## 2021-05-30 NOTE — Discharge Instructions (Addendum)
You were seen in the emergency department today for possible abscess. ? ?As we discussed I did not see an abscess in your mouth.  I believe that the swelling that you are noting in your nose is coming from a nasal polyp.  These are very common.  I have attached some information about them.   ? ?I am also sending you a referral to the ENT doctor. I have attached their contact information in case you do not hear from them by Tuesday you can call and make an appointment.  ?

## 2021-06-02 ENCOUNTER — Ambulatory Visit (INDEPENDENT_AMBULATORY_CARE_PROVIDER_SITE_OTHER): Payer: Medicare Other | Admitting: Family

## 2021-06-02 VITALS — BP 140/63 | HR 83 | Temp 99.3°F | Resp 16 | Wt 233.0 lb

## 2021-06-02 DIAGNOSIS — J3489 Other specified disorders of nose and nasal sinuses: Secondary | ICD-10-CM | POA: Insufficient documentation

## 2021-06-02 NOTE — Patient Instructions (Signed)
We will work on your referral to ENT.  ?

## 2021-06-02 NOTE — Assessment & Plan Note (Signed)
New. It is causing him discomfort. Will try to get him in sooner for consultation with another ENT practice.  ?

## 2021-06-02 NOTE — Progress Notes (Signed)
? ?Subjective:  ? ?By signing my name below, I, Cassell Clement, attest that this documentation has been prepared under the direction and in the presence of [name] NP, 06/02/2021   ? ? Patient ID: Brandon [name], male    DOB: 1940-04-29, 81 y.o.   MRN: [id number] ? ?Chief Complaint  ?Patient presents with  ? Follow-up  ?  Here for hospital follow up  ? Nasal Polyps  ?  New diagnosis at er  ? ? ?HPI ?Patient is in today for an office visit.  ? ?Nasal polyps - Patient was recently diagnosed with nasal polyps. He states that his nostril is very sore and he has been having trouble chewing. He also states that his right nostril is tender upon touch. He is requesting to see a specialist sooner than his previously scheduled date. ? ?Health Maintenance Due  ?Topic Date Due  ? Zoster Vaccines- Shingrix (1 of 2) Never done  ? ? ?Past Medical History:  ?Diagnosis Date  ? Anemia   ? Asthma   ? Hx of childhood asthma, disappeared for a while, then resurfaced 6-7 years ago.   ? Cardiomyopathy   ? with a negative cardiac catheterization in the past. (EF appriximately 40-45%)   ? Heme positive stool 02/01/2019  ? HTN (hypertension)   ? x 30 years  ? Obesity, unspecified   ? Pneumonia   ? Sleep apnea   ? CPAP  ? 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 ? ?Past Surgical History:  ?Procedure Laterality Date  ? None    ? ? ?Family History  ?Problem Relation Age of Onset  ? Cancer Father   ?     multiple melanoma  ? Lupus Sister   ?     died at 29  ? Diabetes Mellitus II Sister   ?     died from covid-19  ? Asthma Daughter   ? Neuropathy Sister   ? Colon cancer Neg Hx   ? Esophageal cancer Neg Hx   ? ? ?Social History  ? ?Socioeconomic History  ? Marital status: Married  ?  Spouse name: [name]  ? Number of children: 3  ? Years of education: Not on file  ? Highest education level: Not on file  ?Occupational History  ? Occupation: retired  ?Tobacco Use  ? Smoking status: Former  ? Smokeless tobacco: Never   ? Tobacco comments:  ?  quit smoling in Jul 27, 1988. started when 18, 1 ppd  ?Vaping Use  ? Vaping Use: Never used  ?Substance and Sexual Activity  ? Alcohol use: Yes  ?  Comment: occ  ? Drug use: Never  ? Sexual activity: Not on file  ?Other Topics Concern  ? Not on file  ?Social History Narrative  ? Grew up in [place], attended [employer]. First wife died of breast ca in 07-27-97. 3 children. Remarried- 8 years. 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   ? Pt signed designated party release granting access to [facility] to his wife [name]. Detailed message may be left on home or cell phone. Roselle Locus August 13, 2009 11:34 am.   ? ?Social Determinants of Health  ? ?Financial Resource Strain: Medium Risk  ? Difficulty of Paying Living Expenses: Somewhat hard  ?Food Insecurity: Not on file  ?Transportation Needs: No Transportation Needs  ? Lack of Transportation (Medical): No  ? Lack of Transportation (Non-Medical): No  ?Physical Activity: Insufficiently Active  ? Days  of Exercise per Week: 5 days  ? Minutes of Exercise per Session: 10 min  ?Stress: No Stress Concern Present  ? Feeling of Stress : Not at all  ?Social Connections: Socially Integrated  ? Frequency of Communication with Friends and Family: More than three times a week  ? Frequency of Social Gatherings with Friends and Family: Three times a week  ? Attends Religious Services: More than 4 times per year  ? Active Member of Clubs or Organizations: Yes  ? Attends Banker Meetings: More than 4 times per year  ? Marital Status: Married  ?Intimate Partner Violence: Unknown  ? Fear of Current or Ex-Partner: No  ? Emotionally Abused: No  ? Physically Abused: No  ? Sexually Abused: Not on file  ? ? ?Outpatient Medications Prior to Visit  ?Medication Sig Dispense Refill  ? albuterol (PROVENTIL) (2.5 MG/3ML) 0.083% nebulizer solution Take 3 mLs (2.5 mg total) by nebulization every 6 (six) hours as needed for wheezing or shortness of  breath. 150 mL 1  ? albuterol (VENTOLIN HFA) 108 (90 Base) MCG/ACT inhaler Inhale 2 puffs into the lungs every 6 (six) hours as needed for wheezing or shortness of breath. 8 g 2  ? amLODipine (NORVASC) 10 MG tablet Take 1 tablet by mouth once daily 90 tablet 0  ? aspirin 81 MG tablet Take 81 mg by mouth daily.    ? b complex vitamins tablet Take 1 tablet by mouth daily.    ? fluticasone-salmeterol (ADVAIR) 500-50 MCG/ACT AEPB INHALE 1 DOSE BY MOUTH INTO LUNGS IN THE MORNING AND AT BEDTIME 180 each 3  ? furosemide (LASIX) 40 MG tablet Take 1 tablet (40 mg total) by mouth daily as needed. 90 tablet 1  ? leflunomide (ARAVA) 20 MG tablet Take 20 mg by mouth daily.    ? metoprolol succinate (TOPROL-XL) 25 MG 24 hr tablet Take 1 tablet by mouth once daily 90 tablet 0  ? montelukast (SINGULAIR) 10 MG tablet TAKE 1 TABLET BY MOUTH AT BEDTIME 90 tablet 0  ? sacubitril-valsartan (ENTRESTO) 49-51 MG Take 1 tablet by mouth 2 (two) times daily. 180 tablet 3  ? spironolactone (ALDACTONE) 25 MG tablet Take 1 tablet by mouth once daily 90 tablet 0  ? tamsulosin (FLOMAX) 0.4 MG CAPS capsule Take 1 capsule (0.4 mg total) by mouth daily. 90 capsule 0  ? ?No facility-administered medications prior to visit.  ? ? ?Allergies  ?Allergen Reactions  ? Ace Inhibitors Cough  ? Isosorb Dinitrate-Hydralazine Other (See Comments)  ?  REACTION: dizziness/hypotensive  ? ? ?Review of Systems  ?HENT:    ?     (+) Nasal Polyps  ? ?   ?Objective:  ?  ?Physical Exam ?Constitutional:   ?   General: He is not in acute distress. ?   Appearance: Normal appearance. He is not ill-appearing.  ?HENT:  ?   Head: Normocephalic and atraumatic.  ?   Right Ear: External ear normal.  ?   Left Ear: External ear normal.  ?   Nose:  ?   Comments: Large smooth mass inside the right nostril  ?Eyes:  ?   Extraocular Movements: Extraocular movements intact.  ?   Pupils: Pupils are equal, round, and reactive to light.  ?Cardiovascular:  ?   Rate and Rhythm: Normal rate and  regular rhythm.  ?   Heart sounds: Normal heart sounds. No murmur heard. ?  No gallop.  ?Pulmonary:  ?   Effort: Pulmonary effort is  normal. No respiratory distress.  ?   Breath sounds: Normal breath sounds. No wheezing or rales.  ?Skin: ?   General: Skin is warm and dry.  ?Neurological:  ?   Mental Status: He is alert and oriented to person, place, and time.  ?Psychiatric:     ?   Mood and Affect: Mood normal.     ?   Behavior: Behavior normal.     ?   Judgment: Judgment normal.  ? ? ?BP 140/63 (BP Location: Right Arm, Patient Position: Sitting, Cuff Size: Large)   Pulse 83   Temp 99.3 ?F (37.4 ?C) (Oral)   Resp 16   Wt 233 lb (105.7 kg)   SpO2 98%   BMI 32.50 kg/m?  ?Wt Readings from Last 3 Encounters:  ?06/02/21 233 lb (105.7 kg)  ?05/30/21 234 lb (106.1 kg)  ?04/22/21 232 lb (105.2 kg)  ? ? ?   ?Assessment & Plan:  ? ?Problem List Items Addressed This Visit   ? ?  ? Unprioritized  ? Nasal mass - Primary  ?  New. It is causing him discomfort. Will try to get him in sooner for consultation with another ENT practice.  ?  ?  ? Relevant Orders  ? Ambulatory referral to ENT  ? ? ? ? ?No orders of the defined types were placed in this encounter. ? ? ?I, Lemont FillersMelissa S O'Sullivan, NP, personally preformed the services described in this documentation.  All medical record entries made by the scribe were at my direction and in my presence.  I have reviewed the chart and discharge instructions (if applicable) and agree that the record reflects my personal performance and is accurate and complete. 06/02/2021 ? ? ?I,Amber Collins,acting as a Neurosurgeonscribe for [employer] & CoMelissa S O'Sullivan, NP.,have documented all relevant documentation on the behalf of Lemont FillersMelissa S O'Sullivan, NP,as directed by  Lemont FillersMelissa S O'Sullivan, NP while in the presence of Lemont FillersMelissa S O'Sullivan, NP. ? ? ? ?Lemont FillersMelissa S O'Sullivan, NP ? ?

## 2021-06-03 DIAGNOSIS — M1612 Unilateral primary osteoarthritis, left hip: Secondary | ICD-10-CM | POA: Diagnosis not present

## 2021-06-05 ENCOUNTER — Ambulatory Visit (INDEPENDENT_AMBULATORY_CARE_PROVIDER_SITE_OTHER): Payer: Medicare Other

## 2021-06-05 DIAGNOSIS — E538 Deficiency of other specified B group vitamins: Secondary | ICD-10-CM | POA: Diagnosis not present

## 2021-06-05 MED ORDER — CYANOCOBALAMIN 1000 MCG/ML IJ SOLN
1000.0000 ug | Freq: Once | INTRAMUSCULAR | Status: AC
  Administered 2021-06-05: 1000 ug via INTRAMUSCULAR

## 2021-06-05 NOTE — Progress Notes (Signed)
Brandon [name] is a 81 y.o. male  presents to the office today for a B12 injection, per physician's orders. ?Original order: per [name], NP ? ?cyanocobalamin, 1000 mg/ml IM was administered in right deltoid today.  ? ?Patient tolerated injection well.  ? ?Next appointment:  07/08/21 ? ? ? ?

## 2021-06-08 ENCOUNTER — Other Ambulatory Visit: Payer: Self-pay | Admitting: Family

## 2021-06-16 ENCOUNTER — Telehealth: Payer: Self-pay | Admitting: Family

## 2021-06-16 DIAGNOSIS — J340 Abscess, furuncle and carbuncle of nose: Secondary | ICD-10-CM | POA: Diagnosis not present

## 2021-06-16 NOTE — Telephone Encounter (Signed)
Called ent but receptionist was not able to "reach anyone in the back to ask if they needed the records" and reported patient had already been seen. ?

## 2021-06-16 NOTE — Telephone Encounter (Signed)
Pt has an appt today with ENT at 10:30. They are needing ov notes regarding to nasal polyps. Also a current med list. They can be reached at [phone]. Please advise.  ?

## 2021-06-18 DIAGNOSIS — J340 Abscess, furuncle and carbuncle of nose: Secondary | ICD-10-CM | POA: Diagnosis not present

## 2021-06-24 DIAGNOSIS — J340 Abscess, furuncle and carbuncle of nose: Secondary | ICD-10-CM | POA: Diagnosis not present

## 2021-06-26 ENCOUNTER — Telehealth: Payer: Self-pay | Admitting: Family

## 2021-06-26 ENCOUNTER — Other Ambulatory Visit: Payer: Self-pay | Admitting: Family

## 2021-06-26 MED ORDER — PROAIR RESPICLICK 108 (90 BASE) MCG/ACT IN AEPB: 1.0000 | INHALATION_SPRAY | Freq: Four times a day (QID) | RESPIRATORY_TRACT | 5 refills | Status: DC | PRN

## 2021-06-26 NOTE — Telephone Encounter (Signed)
Pt has just called back and to inform us that the new ProAir is way to expensive, however he is still using his old one and he doesn't need a new one at this time. I stated understanding and will relay the message to the provider.  ?

## 2021-06-26 NOTE — Telephone Encounter (Signed)
Can you please check with the pharmacy to see if his insurance was applied to the Proair or if they just didn't cover it? Cheaper alternative would be albuterol nebs. I can send if needed.  ?

## 2021-06-26 NOTE — Telephone Encounter (Signed)
Please advise pt that I sent the version of albuterol covered by his pharmacy to [facility].  ?

## 2021-06-26 NOTE — Telephone Encounter (Signed)
Called patient to let him know about rx but no answer and voice mail was full ?

## 2021-06-29 NOTE — Telephone Encounter (Signed)
Pharmacist ran rx by insurance and is 73.00 dollars. He will continue to use the old one for now.  ?

## 2021-07-01 ENCOUNTER — Ambulatory Visit: Payer: Medicare Other

## 2021-07-01 DIAGNOSIS — G4733 Obstructive sleep apnea (adult) (pediatric): Secondary | ICD-10-CM

## 2021-07-01 DIAGNOSIS — R634 Abnormal weight loss: Secondary | ICD-10-CM

## 2021-07-02 ENCOUNTER — Telehealth: Payer: Self-pay | Admitting: Pulmonary Disease

## 2021-07-02 DIAGNOSIS — G4733 Obstructive sleep apnea (adult) (pediatric): Secondary | ICD-10-CM | POA: Diagnosis not present

## 2021-07-02 NOTE — Telephone Encounter (Signed)
HST 07/02/21 >> AHI 27.8, SpO2 low 84% ? ? ?Please inform him that his sleep study shows moderate obstructive sleep apnea.  Please arrange for ROV with me or NP to discuss treatment options. ? ? ? ?

## 2021-07-02 NOTE — Telephone Encounter (Signed)
Called and went over results with patient. He voiced understanding. Scheduled an OV for next Tuesday with NP. Nothing further needed ?

## 2021-07-07 ENCOUNTER — Ambulatory Visit: Payer: Medicare Other | Admitting: Primary Care

## 2021-07-08 ENCOUNTER — Ambulatory Visit (INDEPENDENT_AMBULATORY_CARE_PROVIDER_SITE_OTHER): Payer: Medicare Other

## 2021-07-08 DIAGNOSIS — E538 Deficiency of other specified B group vitamins: Secondary | ICD-10-CM | POA: Diagnosis not present

## 2021-07-08 MED ORDER — CYANOCOBALAMIN 1000 MCG/ML IJ SOLN
1000.0000 ug | Freq: Once | INTRAMUSCULAR | Status: AC
  Administered 2021-07-08: 1000 ug via INTRAMUSCULAR

## 2021-07-08 NOTE — Progress Notes (Signed)
Brandon [name] is a 81 y.o. male presents to the office today for a B12 injection, per physician's orders. ?Original order: per [name], NP ? ?cyanocobalamin, 1000 mg/ml IM was administered in right deltoid today.  ? ?Patient tolerated injection well.   ?

## 2021-07-10 ENCOUNTER — Ambulatory Visit: Payer: Medicare Other | Admitting: Primary Care

## 2021-07-10 ENCOUNTER — Encounter: Payer: Self-pay | Admitting: Primary Care

## 2021-07-10 VITALS — BP 122/76 | HR 65 | Temp 98.9°F | Ht 70.0 in | Wt 233.0 lb

## 2021-07-10 DIAGNOSIS — G4733 Obstructive sleep apnea (adult) (pediatric): Secondary | ICD-10-CM

## 2021-07-10 NOTE — Assessment & Plan Note (Signed)
-   Hx sleep apnea, previously on CPAP. Lost weight/ stopped wearing d/t cracked mask. Repeat home sleep study on 07/02/2021 showed moderate to severe obstructive sleep apnea, AHI 27.8/hr with SPO2 low 84%. We reviewed risks of untreated sleep apnea and treatment options. Recommend patient resume CPAP use nightly, we will place an order for him to receive a new machine. Advised he focus on side sleeping position, continue to work on weight loss efforts and do not drive if experiencing excessive daytime sleepiness of fatigue  ? ?Orders: ?- CPAP auto titrate 5-15cm h20 ? ?Follow-up: ?- 2-3 months with Dr. Craige Cotta or Waynetta Sandy NP for compliance check  ?

## 2021-07-10 NOTE — Progress Notes (Signed)
Reviewed and agree with assessment/plan. ? ? ?Chesley Mires, MD ?[facility] ?07/10/2021, 12:43 PM ?Pager:  [phone] ? ?

## 2021-07-10 NOTE — Patient Instructions (Addendum)
Home sleep study on 07/02/2021 showed moderate to severe obstructive sleep apnea, AHI 27.8/hr with SPO2 low 84%. ? ?Untreated sleep apnea puts you at higher risk for cardiac arrhythmias, pulmonary HTN, stroke and diabetes ?  ?Treatment options include weight loss, side sleeping position, oral appliance, CPAP therapy or referral to ENT for possible surgical options  ? ?Recommendations: ?- Resume CPAP use nightly, we will place an order for you to receive a new machine ?- Focus on side sleeping position ?- Continue to work on weight loss efforts  ?- Do not drive if experiencing excessive daytime sleepiness of fatigue  ? ?Orders: ?- CPAP auto titrate 5-15cm h20 ? ?Follow-up: ?- 2-3 months with Dr. Craige Cotta or Waynetta Sandy NP for compliance check  ?

## 2021-07-10 NOTE — Progress Notes (Signed)
? ?@Patient  ID: Brandon [name], male    DOB: 21-Jan-1941, 81 y.o.   MRN: [id number] ? ?Chief Complaint  ?Patient presents with  ? Follow-up  ?  He is here to go over HST results.  ? ? ?Referring provider: ?[name], NP ? ?HPI: ?81 year old male, former smoker.  Past medical history significant for hypertension, nonischemic cardiomyopathy, chronic systolic heart failure, obstructive sleep apnea, asthma, rheumatoid arthritis, chronic kidney disease.  Patient of Dr. Craige Cotta, last seen in office on 04/22/2021. ? ?Previous [facility] pulmonary encounter: ?07/02/21- Dr. Craige Cotta  ?I last saw him in March 2019.  He weight 269 lbs at that time.  His weight is down to 232 lbs now.  He had dental work done and had to change his diet.  As a result he lost weight.   ? ?His CPAP mask if cracked.  He hasn't been able to use CPAP for the past 6 months.  He feels about the same sleeping w/o CPAP, but didn't have any issues when using CPAP. ? ?He goes to sleep at 3 am.  He falls asleep in 10 minutes.  He wakes up 1 or 2 times to use the bathroom.  He gets out of bed at 1130 am.  He feels okay in the morning.  He denies morning headache.  He does not use anything to help him fall sleep or stay awake.  He naps for about an hour in the afternoon and again after dinner. ? ?He denies sleep walking, sleep talking, bruxism, or nightmares.  There is no history of restless legs.  He denies sleep hallucinations, sleep paralysis, or cataplexy. ? ?The Epworth score is 9 out of 24. ? ?07/10/2021- Inteirm hx  ?Patient presents today for follow-up visit for OSA.  He stopped using CPAP several months ago d/t cracked mask. He did not notice any diffierence in his sleep with PAP use but had not issues while wearing. He has no acute complaints today. Repeat home sleep study on 07/02/2021 showed moderate to severe obstructive sleep apnea, AHI 27.8/hr with SPO2 low 84%. Discussed treatment options including weight loss, CPAP, oral appliance or surgical options.  He is open to resuming PAP therapy.  ? ? ?Allergies  ?Allergen Reactions  ? Ace Inhibitors Cough  ? Isosorb Dinitrate-Hydralazine Other (See Comments)  ?  REACTION: dizziness/hypotensive  ? ? ?Immunization History  ?Administered Date(s) Administered  ? Fluad Quad(high Dose 65+) 11/21/2018, 12/15/2020  ? Influenza Split 02/24/2011, 12/03/2011  ? Influenza Whole 03/16/2007, 12/29/2009  ? Influenza, High Dose Seasonal PF 02/26/2015, 12/09/2015, 04/22/2017, 12/14/2017  ? Influenza,inj,Quad PF,6+ Mos 02/13/2013, 11/14/2013  ? Moderna Covid-19 Vaccine Bivalent Booster 33yrs & up 12/08/2020  ? Moderna SARS-COV2 Booster Vaccination 12/15/2020  ? Moderna Sars-Covid-2 Vaccination 04/08/2019, 05/06/2019, 11/27/2019, 07/02/2020  ? Pneumococcal Conjugate-13 11/14/2013  ? Pneumococcal Polysaccharide-23 08/13/2009  ? Td 07/26/2006  ? Tdap 09/22/2012, 05/23/2017  ? Zoster, Live 05/17/2011  ? ? ?Past Medical History:  ?Diagnosis Date  ? Anemia   ? Asthma   ? Hx of childhood asthma, disappeared for a while, then resurfaced 6-7 years ago.   ? Cardiomyopathy   ? with a negative cardiac catheterization in the past. (EF appriximately 40-45%)   ? Heme positive stool 02/01/2019  ? HTN (hypertension)   ? x 30 years  ? Obesity, unspecified   ? Pneumonia   ? Sleep apnea   ? CPAP  ? 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 ? ?Tobacco History: ?Social History  ? ?  Tobacco Use  ?Smoking Status Former  ?Smokeless Tobacco Never  ?Tobacco Comments  ? quit smoling in 1990. started when 18, 1 ppd  ? ?Counseling given: Not Answered ?Tobacco comments: quit smoling in 1990. started when 18, 1 ppd ? ? ?Outpatient Medications Prior to Visit  ?Medication Sig Dispense Refill  ? albuterol (PROVENTIL) (2.5 MG/3ML) 0.083% nebulizer solution Take 3 mLs (2.5 mg total) by nebulization every 6 (six) hours as needed for wheezing or shortness of breath. 150 mL 1  ? Albuterol Sulfate (PROAIR RESPICLICK) 108 (90 Base) MCG/ACT AEPB Inhale 1 puff  into the lungs every 6 (six) hours as needed. 1 each 5  ? amLODipine (NORVASC) 10 MG tablet Take 1 tablet by mouth once daily 90 tablet 0  ? aspirin 81 MG tablet Take 81 mg by mouth daily.    ? b complex vitamins tablet Take 1 tablet by mouth daily.    ? fluticasone-salmeterol (ADVAIR) 500-50 MCG/ACT AEPB INHALE 1 DOSE BY MOUTH INTO LUNGS IN THE MORNING AND AT BEDTIME 180 each 3  ? furosemide (LASIX) 40 MG tablet Take 1 tablet (40 mg total) by mouth daily as needed. 90 tablet 1  ? leflunomide (ARAVA) 20 MG tablet Take 20 mg by mouth daily.    ? metoprolol succinate (TOPROL-XL) 25 MG 24 hr tablet Take 1 tablet by mouth once daily 90 tablet 0  ? montelukast (SINGULAIR) 10 MG tablet TAKE 1 TABLET BY MOUTH AT BEDTIME 90 tablet 0  ? sacubitril-valsartan (ENTRESTO) 49-51 MG Take 1 tablet by mouth 2 (two) times daily. 180 tablet 3  ? spironolactone (ALDACTONE) 25 MG tablet Take 1 tablet by mouth once daily 90 tablet 0  ? tamsulosin (FLOMAX) 0.4 MG CAPS capsule Take 1 capsule by mouth once daily 90 capsule 0  ? ?No facility-administered medications prior to visit.  ? ?Review of Systems ? ?Review of Systems  ?Constitutional: Negative.   ?HENT: Negative.    ?Respiratory: Negative.    ? ? ?Physical Exam ? ?BP 122/76 (BP Location: Left Arm, Cuff Size: Normal)   Pulse 65   Temp 98.9 ?F (37.2 ?C) (Oral)   Ht  (1.778 m)   Wt 233 lb (105.7 kg)   SpO2 96%   BMI 33.43 kg/m?  ?Physical Exam ?Constitutional:   ?   Appearance: Normal appearance.  ?HENT:  ?   Head: Normocephalic and atraumatic.  ?   Mouth/Throat:  ?   Mouth: Mucous membranes are moist.  ?   Pharynx: Oropharynx is clear.  ?Cardiovascular:  ?   Rate and Rhythm: Normal rate and regular rhythm.  ?Pulmonary:  ?   Effort: Pulmonary effort is normal.  ?   Breath sounds: Normal breath sounds.  ?Musculoskeletal:     ?   General: Normal range of motion.  ?Skin: ?   General: Skin is warm and dry.  ?Neurological:  ?   General: No focal deficit present.  ?   Mental  Status: He is alert and oriented to person, place, and time. Mental status is at baseline.  ?Psychiatric:     ?   Mood and Affect: Mood normal.     ?   Behavior: Behavior normal.     ?   Thought Content: Thought content normal.     ?   Judgment: Judgment normal.  ?  ? ?Lab Results: ? ?CBC ?   ?Component Value Date/Time  ? WBC 7.4 01/21/2021 1116  ? RBC 3.80 (L) 01/21/2021 1116  ? HGB 12.1 (  L) 01/21/2021 1116  ? HGB 12.7 (L) 04/24/2019 1343  ? HCT 37.1 (L) 01/21/2021 1116  ? PLT 216.0 01/21/2021 1116  ? PLT 285 04/24/2019 1343  ? MCV 97.5 01/21/2021 1116  ? MCH 28.8 04/24/2019 1343  ? MCHC 32.7 01/21/2021 1116  ? RDW 14.5 01/21/2021 1116  ? LYMPHSABS 1.4 01/21/2021 1116  ? MONOABS 0.8 01/21/2021 1116  ? EOSABS 0.4 01/21/2021 1116  ? BASOSABS 0.1 01/21/2021 1116  ? ? ?BMET ?   ?Component Value Date/Time  ? NA 138 04/15/2021 1232  ? NA 139 02/23/2021 1203  ? K 5.0 Hemolysis seen 04/15/2021 1232  ? CL 106 04/15/2021 1232  ? CO2 28 04/15/2021 1232  ? GLUCOSE 84 04/15/2021 1232  ? BUN 16 04/15/2021 1232  ? BUN 15 02/23/2021 1203  ? CREATININE 1.49 04/15/2021 1232  ? CREATININE 1.55 (H) 04/24/2019 1343  ? CREATININE 1.40 (H) 04/12/2019 1317  ? CALCIUM 9.1 04/15/2021 1232  ? GFRNONAA 38 (L) 11/02/2019 1501  ? GFRNONAA 42 (L) 04/24/2019 1343  ? GFRNONAA 48 (L) 04/12/2019 1317  ? GFRAA 44 (L) 11/02/2019 1501  ? GFRAA 49 (L) 04/24/2019 1343  ? GFRAA 55 (L) 04/12/2019 1317  ? ? ?BNP ?   ?Component Value Date/Time  ? BNP 157.9 (H) 06/04/2019 1657  ? ? ?ProBNP ?   ?Component Value Date/Time  ? PROBNP 215.0 (H) 12/27/2011 1155  ? ? ?Imaging: ?No results found. ? ? ?Assessment & Plan:  ? ?Obstructive sleep apnea ?- Hx sleep apnea, previously on CPAP. Lost weight/ stopped wearing d/t cracked mask. Repeat home sleep study on 07/02/2021 showed moderate to severe obstructive sleep apnea, AHI 27.8/hr with SPO2 low 84%. We reviewed risks of untreated sleep apnea and treatment options. Recommend patient resume CPAP use nightly, we will  place an order for him to receive a new machine. Advised he focus on side sleeping position, continue to work on weight loss efforts and do not drive if experiencing excessive daytime sleepiness of fatigue  ? ?Orders

## 2021-07-11 ENCOUNTER — Other Ambulatory Visit: Payer: Self-pay | Admitting: Pharmacist

## 2021-07-13 ENCOUNTER — Ambulatory Visit (INDEPENDENT_AMBULATORY_CARE_PROVIDER_SITE_OTHER): Payer: Medicare Other | Admitting: Pharmacist

## 2021-07-13 ENCOUNTER — Telehealth: Payer: Self-pay | Admitting: Pharmacist

## 2021-07-13 DIAGNOSIS — I5022 Chronic systolic (congestive) heart failure: Secondary | ICD-10-CM

## 2021-07-13 DIAGNOSIS — I10 Essential (primary) hypertension: Secondary | ICD-10-CM

## 2021-07-13 DIAGNOSIS — J45909 Unspecified asthma, uncomplicated: Secondary | ICD-10-CM

## 2021-07-13 NOTE — Telephone Encounter (Signed)
Discussed Advair medication assistance program and Proair cost. See Chronic Care Management phone visit.  ?

## 2021-07-16 MED ORDER — ALBUTEROL SULFATE HFA 108 (90 BASE) MCG/ACT IN AERS: 1.0000 | INHALATION_SPRAY | Freq: Four times a day (QID) | RESPIRATORY_TRACT | 0 refills | Status: DC | PRN

## 2021-07-16 NOTE — Patient Instructions (Signed)
Mr. Brandon StephensIt was a pleasure speaking with you  ?Below is a summary of your health goals and care plan ? ? ?If you have any questions or concerns, please feel free to contact me either at the phone number below or with a MyChart message.  ? ?Keep up the good work! ? ?Henrene Pastor, PharmD ?Clinical Pharmacist ?[facility] ?[facility] ?[phone] (direct line)  ?[phone] (main office number) ? ? ?Chronic Care Management Care Plan ?Hypertension ?BP Readings from Last 3 Encounters:  ?07/10/21 122/76  ?06/02/21 140/63  ?05/30/21 (!) 143/83  ? ?Pharmacist Clinical Goal(s): ?Over the next 90 days, patient will work with PharmD and providers to maintain BP goal <140/90 ?Current regimen:  ?Amlodipine 10mg  daily  ?Metoprolol Succinate 25mg  daily ?Entresto 49/51mg  twice a day ?Spironolactone 25mg  daily ?Interventions: ?Discussed blood pressure goal ?Patient self care activities - Over the next 90 days, patient will: ?Maintain hypertension medication regimen.  ?Continue to check blood pressure 2 to 3 times per week, record and bring to future appoitments. ?Continue to limit daily salt intake goal < 2300 mg; ? ?Pre-Diabetes ?Lab Results  ?Component Value Date/Time  ? HGBA1C 6.3 11/07/2018 11:59 AM  ? HGBA1C 6.0 12/20/2017 01:29 PM  ? ?Pharmacist Clinical Goal(s): ?Over the next 90 days, patient will work with PharmD and providers to maintain A1c goal <6.5% ?Current regimen:  ?Diet and exercise management   ?Interventions: ?Discussed diet ?Patient self care activities - Over the next 90 days, patient will: ?Maintain A1c less than 6.5% ?Consider rechecking A1c with next labs ? ?Asthma ?Pharmacist Clinical Goal(s) ?Over the next 90 days, patient will work with PharmD and providers to reduce symptoms associated with Asthma and reduce barriers to medication ?Current regimen:  ?Advair 500/58mcg 1 puff twice daily (maintenance inhaler)  ?Montelukast 10mg  daily  ?Albuterol inhaler - inhale 1 to 2 puffs every  6 hours as needed (rescue inhaler)  ?Interventions: ?Reviewed medication assistance program application for Adviar (needed to clarify income and household number)  ?Reminded to rinse mouth after each Advair use ?Reviewed maintenance versus rescue inhalers ?Patient self care activities - Over the next 90 days, patient will: ?Continue current medications for asthma ? ?Heart Failure ?Pharmacist Clinical Goal(s) ?Over the next 90 days, patient will work with PharmD and providers to reduce symptoms associated with heart failure ?Current regimen:  ?Furosemide 40mg  daily as needed ?Metoprolol Succinate 25mg  daily ?Spironolactone 25mg  daily ?Entresto 49/51mg  twice a day ?Interventions: ?Collaboration with provider and patient assistance program regarding medication management and Entresto patient assistance. Patient has been approve to received Entresto from [employer] patient assistance program 02/19/2021 thru 03/14/2022. ?Patient self care activities - Over the next 90 days, patient will: ? Maintain heart failure medication regimen ?Continue to weight daily in morning; use furosemide as needed for weight gain of more than 3 pounds in 24 hours or 5 lbs in 1 week; also notify providers of you have weight gain or symptoms of worsening heart failure (swelling, fluid retention, shortness of breath (especially when lying down), cough ? ?Rheumatoid Arthritis / Left hip pain: ?Current treatment:  ?Leflunomide 20mg  daily ?Patient self care activities - Over the next 90 days, patient will: ?Continue current therapy for rheumatoid arthritis ?Continue regular follow up with Dr Dierdre Forth for rheumatoid arthritis ? ?Health Maintenance  ?Pharmacist Clinical Goal(s) ?Over the next 90 days, patient will work with PharmD and providers to complete health maintenance screenings/vaccinations ?Interventions: ?Discussed Shingrix vaccine series ?Patient self care activities - Over the next 90 days,  patient will: ?Complete Shingrix vaccine series   ? ?Medication management ?Pharmacist Clinical Goal(s): ?Over the next 90 days, patient will work with PharmD and providers to maintain optimal medication adherence ?Current pharmacy: [facility] ?Interventions ?Comprehensive medication review performed. ?Continue current medication management strategy ?Patient self care activities - Over the next 90 days, patient will: ?Focus on medication adherence by filling and taking medications appropriately  ?Take medications as prescribed ?Report any questions or concerns to PharmD and/or provider(s) ? ?Patient Goals/Self-Care Activities ?Over the next 90 days, patient will:  ?take medications as prescribed ?focus on medication adherence by using compliance aides like weekly pill box ?weigh daily, and contact provider if weight gain of 3 pounds in one day or 5 pounds in a week ?collaborate with provider on medication access solutions ?Complete Shingrix vaccine ? ?Follow Up Plan: The care management team will reach out to the patient again over the next 3 months.  ? ? ?Patient verbalizes understanding of instructions and care plan provided today and agrees to view in MyChart. Active MyChart status confirmed with patient.    ?

## 2021-07-16 NOTE — Chronic Care Management (AMB) (Signed)
? ? ? ?Summary:  ?Patient state last albuterol inhaler cost was $47. Reviewed his formulary and cost should be $5. Coordinated with [facility] to fill generic albuterol inhaler that was preferred on patient's plan (not the Proair albuterol) and cost decreased to $5.  ?Unfortunately though patient will need to meet $600 out of pocket in 2023 for Advair patient assistance program. Patient aware - has not reach this level yet.  ? ?Subjective: ?Brandon [name] is an 81 y.o. year old male who is a primary patient of Debbrah Alar, NP.  The CCM team was consulted for assistance with disease management and care coordination needs.   ? ?Engaged with patient by telephone for follow up visit in response to provider referral for pharmacy case management and/or care coordination services.  ? ?Consent to Services:  ?The patient was given information about Chronic Care Management services, agreed to services, and gave verbal consent prior to initiation of services.  Please see initial visit note for detailed documentation.  ? ?Patient Care Team: ?Debbrah Alar, NP as PCP - General (Internal Medicine) ?Minus Breeding, MD as PCP - Cardiology (Cardiology) ?Cherre Robins, RPH-CPP (Pharmacist) ? ?Recent office visits: ?04/15/2021 [name] - Int Med. Seen for follow up chronic conditions. No med changes.  ?02/11/2021 - Int Med [name]) Nurses Visit for B12 injection ?01/20/2021 [facility]am [name]) Follow up asthma. Prescribed Albuterol Sulfate HFA inhaler INHALE 2 PUFFS BY MOUTH EVERY 6 HOURS AS NEEDED FOR WHEEZING AND SHORTNESS OF BREATH and Albuterol nebulizer solution 2.5 mg Nebulization Every 6 hours as needed ?11/06/2020 - PCP - B12 injection ? ?Recent consult visits: ?04/22/2021 - Pulm (Dr Halford Chessman) F/U OSA. Ordered home sleep study ?02/26/2021 - Rheumatology (Dr Amil Amen) visit note not available in Epic at time of Chronic Care Management visit.  ?01/30/2021 - Cardio ([name]) F/U CHF. Changed losartan back to  Entresto 49/35m twice a day. F/U in 2 weeks for lab ? ?Hospital visits: ?None in previous 6 months ? ?Objective: ? ?Lab Results  ?Component Value Date  ? CREATININE 1.49 04/15/2021  ? CREATININE 1.45 (H) 02/23/2021  ? CREATININE 1.30 01/21/2021  ? ? ?Lab Results  ?Component Value Date  ? HGBA1C 6.3 11/07/2018  ? ?Last diabetic Eye exam: No results found for: HMDIABEYEEXA  ?Last diabetic Foot exam: No results found for: HMDIABFOOTEX  ? ?   ?Component Value Date/Time  ? CHOL 151 08/12/2020 1714  ? TRIG 51.0 08/12/2020 1714  ? HDL 42.30 08/12/2020 1714  ? CHOLHDL 4 08/12/2020 1714  ? VLDL 10.2 08/12/2020 1714  ? LBenson98 08/12/2020 1714  ? ? ? ?  Latest Ref Rng & Units 08/12/2020  ?  5:14 PM 04/24/2019  ?  1:43 PM 04/12/2019  ?  1:17 PM  ?Hepatic Function  ?Total Protein 6.0 - 8.3 g/dL 6.7   7.0   6.4    ? 6.2    ?Albumin 3.5 - 5.2 g/dL 3.9   3.5     ?AST 0 - 37 U/L _0 ?ALT 0 - 53 U/L _1 ?Alk Phosphatase 39 - 117 U/L 75   61     ?Total Bilirubin 0.2 - 1.2 mg/dL 0.8   0.7   0.4    ? ? ?Lab Results  ?Component Value Date/Time  ? TSH 1.06 01/12/2019 02:11 PM  ? TSH 1.84 12/27/2011 11:55 AM  ? ? ? ?  Latest Ref Rng &  Units 01/21/2021  ? 11:16 AM 04/24/2019  ?  1:43 PM 04/12/2019  ?  1:17 PM  ?CBC  ?WBC 4.0 - 10.5 K/uL 7.4   10.0   8.3    ?Hemoglobin 13.0 - 17.0 g/dL 12.1   12.7   12.8    ?Hematocrit 39.0 - 52.0 % 37.1   40.7   38.2    ?Platelets 150.0 - 400.0 K/uL 216.0   285   49    ? ? ?No results found for: VD25OH ? ?Clinical ASCVD: No  ?The ASCVD Risk score (Arnett DK, et al., 2019) failed to calculate for the following reasons: ?  The 2019 ASCVD risk score is only valid for ages 45 to 79   ? ?Other: EF = 45-50% (01/02/2019) ? ?Social History  ? ?Tobacco Use  ?Smoking Status Former  ?Smokeless Tobacco Never  ?Tobacco Comments  ? quit smoling in 1990. started when 18, 1 ppd  ? ?BP Readings from Last 3 Encounters:  ?07/10/21 122/76  ?06/02/21 140/63  ?05/30/21 (!) 143/83  ? ?Pulse Readings from Last  3 Encounters:  ?07/10/21 65  ?06/02/21 83  ?05/30/21 69  ? ?Wt Readings from Last 3 Encounters:  ?07/10/21 233 lb (105.7 kg)  ?06/02/21 233 lb (105.7 kg)  ?05/30/21 234 lb (106.1 kg)  ? ? ?Assessment: Review of patient past medical history, allergies, medications, health status, including review of consultants reports, laboratory and other test data, was performed as part of comprehensive evaluation and provision of chronic care management services.  ? ?SDOH:  (Social Determinants of Health) assessments and interventions performed:  ? ? ? ? ? ?[place] ? ?Allergies  ?Allergen Reactions  ? Ace Inhibitors Cough  ? Isosorb Dinitrate-Hydralazine Other (See Comments)  ?  REACTION: dizziness/hypotensive  ? ? ?Medications Reviewed Today   ? ? Reviewed by Cherre Robins, RPH-CPP (Pharmacist) on 07/16/21 at 58  Med List Status: <None>  ? ?Medication Order Taking? Sig Documenting Provider Last Dose Status Informant  ?albuterol (PROVENTIL) (2.5 MG/3ML) 0.083% nebulizer solution [id number] Yes Take 3 mLs (2.5 mg total) by nebulization every 6 (six) hours as needed for wheezing or shortness of breath. Debbrah Alar, NP Taking Active   ?Albuterol Sulfate (PROAIR RESPICLICK) 448 (90 Base) MCG/ACT AEPB [id number] Yes Inhale 1 puff into the lungs every 6 (six) hours as needed. Debbrah Alar, NP Taking Active   ?amLODipine (NORVASC) 10 MG tablet [id number] Yes Take 1 tablet by mouth once daily Debbrah Alar, NP Taking Active   ?aspirin 81 MG tablet [id number] Yes Take 81 mg by mouth daily. [provider] Taking Active   ?b complex vitamins tablet [id number] Yes Take 1 tablet by mouth daily. Debbrah Alar, NP Taking Active   ?fluticasone-salmeterol (ADVAIR) 500-50 MCG/ACT AEPB [id number] Yes INHALE 1 DOSE BY MOUTH INTO LUNGS IN THE MORNING AND AT BEDTIME Debbrah Alar, NP Taking Active   ?         ?Med Note Cherre Robins B   Thu Jul 16, 2021  1:19 PM)    ?furosemide (LASIX) 40 MG tablet [id number]  Yes Take 1 tablet (40 mg total) by mouth daily as needed. Debbrah Alar, NP Taking Active   ?leflunomide (ARAVA) 20 MG tablet [id number] Yes Take 20 mg by mouth daily. [provider] Taking Active   ?metoprolol succinate (TOPROL-XL) 25 MG 24 hr tablet [id number] Yes Take 1 tablet by mouth once daily Debbrah Alar, NP Taking Active   ?montelukast (SINGULAIR) 10 MG tablet [id number] Yes TAKE 1 TABLET  BY MOUTH AT BEDTIME Debbrah Alar, NP Taking Active   ?sacubitril-valsartan (ENTRESTO) 49-51 MG [id number] Yes Take 1 tablet by mouth 2 (two) times daily. Minus Breeding, MD Taking Active   ?         ?Med Note Antony Contras, Tamaiya Bump B   Wed Mar 04, 2021 11:40 AM) Approved 02/19/2021 to get from [employer] patient assistance program thru 03/14/2022  ?spironolactone (ALDACTONE) 25 MG tablet [id number] Yes Take 1 tablet (25 mg total) by mouth daily. Colon Branch, MD Taking Active   ?tamsulosin [facility]) 0.4 MG CAPS capsule [id number] Yes Take 1 capsule by mouth once daily Debbrah Alar, NP Taking Active   ? ?  ?  ? ?  ? ? ?Patient Active Problem List  ? Diagnosis Date Noted  ? 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 B12 deficiency 08/13/2020  ? Rheumatoid arthritis ([place]) 08/12/2020  ? BPH (benign prostatic hyperplasia) 08/12/2020  ? Folic acid deficiency 48/03/6551  ? Educated about COVID-19 virus infection 10/17/2019  ? 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 Leg swelling 10/17/2019  ? Heme positive stool 02/01/2019  ? RBBB 10/03/2018  ? Bilateral shoulder pain 08/24/2016  ? Depression 12/09/2015  ? Edema 12/06/2011  ? Asthma 08/13/2009  ? Hyperglycemia 08/13/2009  ? Obstructive sleep apnea 01/23/2009  ? CHRONIC SYSTOLIC HEART FAILURE 74/82/7078  ? OBESITY, UNSPECIFIED 08/26/2008  ? Disorder resulting from impaired renal function 08/26/2008  ? Essential hypertension 06/28/2008  ? Nonischemic cardiomyopathy ([place]) 06/28/2008  ? ? ?Immunization History  ?Administered Date(s) Administered  ? Fluad Quad(high Dose  65+) 11/21/2018, 12/15/2020  ? Influenza Split 02/24/2011, 12/03/2011  ? Influenza Whole 03/16/2007, 12/29/2009  ? Influenza, High Dose Seasonal PF 02/26/2015, 12/09/2015, 04/22/2017, 12/14/2017  ? In

## 2021-07-31 DIAGNOSIS — G4733 Obstructive sleep apnea (adult) (pediatric): Secondary | ICD-10-CM | POA: Diagnosis not present

## 2021-08-01 ENCOUNTER — Other Ambulatory Visit: Payer: Self-pay | Admitting: Family

## 2021-08-03 DIAGNOSIS — M1612 Unilateral primary osteoarthritis, left hip: Secondary | ICD-10-CM | POA: Diagnosis not present

## 2021-08-05 ENCOUNTER — Ambulatory Visit (INDEPENDENT_AMBULATORY_CARE_PROVIDER_SITE_OTHER): Payer: Medicare Other

## 2021-08-05 DIAGNOSIS — E538 Deficiency of other specified B group vitamins: Secondary | ICD-10-CM

## 2021-08-05 MED ORDER — CYANOCOBALAMIN 1000 MCG/ML IJ SOLN
1000.0000 ug | INTRAMUSCULAR | Status: DC
  Administered 2021-08-05: 1000 ug via INTRAMUSCULAR

## 2021-08-05 NOTE — Progress Notes (Signed)
Brandon [name] is a 81 y.o. male presents to the office today for a B12 injection, per physician's orders. Original order: per [name], NP  cyanocobalamin, 1000 mg/ml IM was administered in left deltoid today.   Patient tolerated injection well.    Pt scheduled for June

## 2021-08-07 ENCOUNTER — Telehealth: Payer: Self-pay | Admitting: Pulmonary Disease

## 2021-08-07 NOTE — Telephone Encounter (Signed)
I called the patient to set up at complience check for his CPAP between 08/31/21 and 10/29/21. He will call back next week for a follow up.

## 2021-08-12 DIAGNOSIS — R351 Nocturia: Secondary | ICD-10-CM

## 2021-08-12 DIAGNOSIS — I110 Hypertensive heart disease with heart failure: Secondary | ICD-10-CM

## 2021-08-12 DIAGNOSIS — I5020 Unspecified systolic (congestive) heart failure: Secondary | ICD-10-CM | POA: Diagnosis not present

## 2021-08-12 DIAGNOSIS — J45909 Unspecified asthma, uncomplicated: Secondary | ICD-10-CM | POA: Diagnosis not present

## 2021-08-12 DIAGNOSIS — N401 Enlarged prostate with lower urinary tract symptoms: Secondary | ICD-10-CM | POA: Diagnosis not present

## 2021-08-12 DIAGNOSIS — M1990 Unspecified osteoarthritis, unspecified site: Secondary | ICD-10-CM

## 2021-08-12 DIAGNOSIS — Z87891 Personal history of nicotine dependence: Secondary | ICD-10-CM

## 2021-08-12 NOTE — Telephone Encounter (Signed)
Patient scheduled on 09/08/2021 at 3pm with Derl Barrow, NP- appointment remember mailed. Nothing further needed.

## 2021-08-14 ENCOUNTER — Ambulatory Visit: Payer: Medicare Other | Admitting: Family

## 2021-08-21 DIAGNOSIS — M1612 Unilateral primary osteoarthritis, left hip: Secondary | ICD-10-CM | POA: Diagnosis not present

## 2021-08-31 DIAGNOSIS — G4733 Obstructive sleep apnea (adult) (pediatric): Secondary | ICD-10-CM | POA: Diagnosis not present

## 2021-09-02 ENCOUNTER — Ambulatory Visit (INDEPENDENT_AMBULATORY_CARE_PROVIDER_SITE_OTHER): Payer: Medicare Other

## 2021-09-02 DIAGNOSIS — E538 Deficiency of other specified B group vitamins: Secondary | ICD-10-CM

## 2021-09-02 MED ORDER — CYANOCOBALAMIN 1000 MCG/ML IJ SOLN
1000.0000 ug | Freq: Once | INTRAMUSCULAR | Status: AC
  Administered 2021-09-02: 1000 ug via INTRAMUSCULAR

## 2021-09-02 NOTE — Progress Notes (Signed)
Brandon [name] is a 81 y.o. male presents to the office today for a B12 injection, per physician's orders. Original order: per [name], NP  cyanocobalamin, 1000 mg/ml IM was administered in left deltoid today.   Patient tolerated injection well.

## 2021-09-04 ENCOUNTER — Other Ambulatory Visit: Payer: Self-pay | Admitting: Internal Medicine

## 2021-09-04 ENCOUNTER — Other Ambulatory Visit: Payer: Self-pay | Admitting: Family

## 2021-09-04 ENCOUNTER — Other Ambulatory Visit: Payer: Self-pay | Admitting: Cardiology

## 2021-09-08 ENCOUNTER — Ambulatory Visit: Payer: Medicare Other | Admitting: Primary Care

## 2021-09-08 ENCOUNTER — Encounter: Payer: Self-pay | Admitting: Primary Care

## 2021-09-08 DIAGNOSIS — G4733 Obstructive sleep apnea (adult) (pediatric): Secondary | ICD-10-CM

## 2021-09-08 DIAGNOSIS — I5022 Chronic systolic (congestive) heart failure: Secondary | ICD-10-CM

## 2021-09-08 NOTE — Assessment & Plan Note (Signed)
-   No shortness of breath symptoms.  Trace lower extremity edema on exam.  Continue as needed Lasix 40 mg as needed daily.

## 2021-09-08 NOTE — Progress Notes (Signed)
Reviewed and agree with assessment/plan.   Coralyn Helling, MD [facility] Pulmonary/Critical Care 09/08/2021, 4:06 PM Pager:  [phone]

## 2021-09-08 NOTE — Progress Notes (Signed)
@Patient  ID: Brandon [name], male    DOB: 1940-04-17, 81 y.o.   MRN: [id number]  Chief Complaint  Patient presents with   Follow-up    Referring provider: [name], NP  HPI: 81 year old male, former smoker.  Past medical history significant for hypertension, nonischemic cardiomyopathy, chronic systolic heart failure, obstructive sleep apnea, asthma, rheumatoid arthritis, chronic kidney disease.  Patient of Dr. Craige Cotta.   Previous [facility] pulmonary encounter: 07/02/21- Dr. Craige Cotta  I last saw him in March 2019.  He weight 269 lbs at that time.  His weight is down to 232 lbs now.  He had dental work done and had to change his diet.  As a result he lost weight.    His CPAP mask if cracked.  He hasn't been able to use CPAP for the past 6 months.  He feels about the same sleeping w/o CPAP, but didn't have any issues when using CPAP.  He goes to sleep at 3 am.  He falls asleep in 10 minutes.  He wakes up 1 or 2 times to use the bathroom.  He gets out of bed at 1130 am.  He feels okay in the morning.  He denies morning headache.  He does not use anything to help him fall sleep or stay awake.  He naps for about an hour in the afternoon and again after dinner.  He denies sleep walking, sleep talking, bruxism, or nightmares.  There is no history of restless legs.  He denies sleep hallucinations, sleep paralysis, or cataplexy.  The Epworth score is 9 out of 24.  07/10/2021 Patient presents today for follow-up visit for OSA.  He stopped using CPAP several months ago d/t cracked mask. He did not notice any diffierence in his sleep with PAP use but had not issues while wearing. He has no acute complaints today. Repeat home sleep study on 07/02/2021 showed moderate to severe obstructive sleep apnea, AHI 27.8/hr with SPO2 low 84%. Discussed treatment options including weight loss, CPAP, oral appliance or surgical options. He is open to resuming PAP therapy.   09/08/2021 - Inteirm hx  Patient presents  today for 60-month follow-up.  Repeat home sleep study on 07/02/2021 showed moderate to severe obstructive sleep apnea, AHI 27.8 an hour with SPO2 low 84%.  During his last office visit in April recommended patient resume CPAP. He received new CPAP machine 6-8 weeks ago. Overall he is doing well, getting used to new CPAP mask. He recently changed to nasal pillow from full face mask d/t poor seal. His weight is down 30 to 40 pounds over the last several months. Sleeping without significant issues at night. No residual daytime sleepiness.   Airview download 08/08/2021 - 09/06/2021 22/30 days used (70%); 19 days (63%) greater than 4 hours Average usage days used 4 hours and 51 minutes Pressure 5 to 15 cm H2O (9.8 cm H2O-95%) Air leaks 11 L/min (95%) Events per hour- AI 6.3/HI 0.9 Apnea index -Central 5.5/obstructive 0.7 AHI 7.2  Allergies  Allergen Reactions   Ace Inhibitors Cough   Isosorb Dinitrate-Hydralazine Other (See Comments)    REACTION: dizziness/hypotensive    Immunization History  Administered Date(s) Administered   Fluad Quad(high Dose 65+) 11/21/2018, 12/15/2020   Influenza Split 02/24/2011, 12/03/2011   Influenza Whole 03/16/2007, 12/29/2009   Influenza, High Dose Seasonal PF 02/26/2015, 12/09/2015, 04/22/2017, 12/14/2017   Influenza,inj,Quad PF,6+ Mos 02/13/2013, 11/14/2013   Moderna Covid-19 Vaccine Bivalent Booster 83yrs & up 12/08/2020   Moderna SARS-COV2 Booster Vaccination 12/15/2020  Moderna Sars-Covid-2 Vaccination 04/08/2019, 05/06/2019, 11/27/2019, 07/02/2020   Pneumococcal Conjugate-13 11/14/2013   Pneumococcal Polysaccharide-23 08/13/2009   Td 07/26/2006   Tdap 09/22/2012, 05/23/2017   Zoster, Live 05/17/2011    Past Medical History:  Diagnosis Date   Anemia    Asthma    Hx of childhood asthma, disappeared for a while, then resurfaced 6-7 years ago.    Cardiomyopathy    with a negative cardiac catheterization in the past. (EF appriximately 40-45%)     Heme positive stool 02/01/2019   HTN (hypertension)    x 30 years   Obesity, unspecified    Pneumonia    Sleep apnea    CPAP   Unspecified disorder resulting from impaired renal function     Tobacco History: Social History   Tobacco Use  Smoking Status Former  Smokeless Tobacco Never  Tobacco Comments   quit smoling in 1990. started when 18, 1 ppd   Counseling given: Not Answered Tobacco comments: quit smoling in 1990. started when 18, 1 ppd   Outpatient Medications Prior to Visit  Medication Sig Dispense Refill   albuterol (PROVENTIL) (2.5 MG/3ML) 0.083% nebulizer solution Take 3 mLs (2.5 mg total) by nebulization every 6 (six) hours as needed for wheezing or shortness of breath. 150 mL 1   albuterol (VENTOLIN HFA) 108 (90 Base) MCG/ACT inhaler Inhale 1-2 puffs into the lungs every 6 (six) hours as needed for wheezing or shortness of breath. 8 g 0   amLODipine (NORVASC) 10 MG tablet Take 1 tablet by mouth once daily 90 tablet 0   aspirin 81 MG tablet Take 81 mg by mouth daily.     b complex vitamins tablet Take 1 tablet by mouth daily.     fluticasone-salmeterol (ADVAIR) 500-50 MCG/ACT AEPB INHALE 1 DOSE BY MOUTH INTO LUNGS IN THE MORNING AND AT BEDTIME 180 each 3   furosemide (LASIX) 40 MG tablet Take 1 tablet (40 mg total) by mouth daily as needed. 90 tablet 1   leflunomide (ARAVA) 20 MG tablet Take 20 mg by mouth daily.     metoprolol succinate (TOPROL-XL) 25 MG 24 hr tablet Take 1 tablet by mouth once daily 90 tablet 0   montelukast (SINGULAIR) 10 MG tablet TAKE 1 TABLET BY MOUTH AT BEDTIME 90 tablet 0   sacubitril-valsartan (ENTRESTO) 49-51 MG Take 1 tablet by mouth 2 (two) times daily. 180 tablet 3   spironolactone (ALDACTONE) 25 MG tablet Take 1 tablet (25 mg total) by mouth daily. 90 tablet 0   tamsulosin (FLOMAX) 0.4 MG CAPS capsule Take 1 capsule by mouth once daily 90 capsule 0   Facility-Administered Medications Prior to Visit  Medication Dose Route Frequency  Provider Last Rate Last Admin   cyanocobalamin ((VITAMIN B-12)) injection 1,000 mcg  1,000 mcg Intramuscular Q30 days [name], NP   1,000 mcg at 08/05/21 1404    Review of Systems  Review of Systems  Constitutional: Negative.  Negative for fatigue.  HENT: Negative.    Respiratory: Negative.    Psychiatric/Behavioral:  Negative for sleep disturbance.      Physical Exam  BP 124/76 (BP Location: Left Arm, Cuff Size: Normal)   Pulse 61   Ht 5\' 11"  (1.803 m)   Wt 233 lb 6.4 oz (105.9 kg)   SpO2 98%   BMI 32.55 kg/m  Physical Exam Constitutional:      Appearance: Normal appearance.  HENT:     Head: Normocephalic and atraumatic.  Cardiovascular:     Rate and Rhythm:  Normal rate and regular rhythm.  Pulmonary:     Effort: Pulmonary effort is normal.     Breath sounds: Normal breath sounds.  Musculoskeletal:        General: Normal range of motion.  Skin:    General: Skin is warm and dry.  Neurological:     General: No focal deficit present.     Mental Status: He is alert and oriented to person, place, and time. Mental status is at baseline.  Psychiatric:        Mood and Affect: Mood normal.        Behavior: Behavior normal.        Thought Content: Thought content normal.        Judgment: Judgment normal.      Lab Results:  CBC    Component Value Date/Time   WBC 7.4 01/21/2021 1116   RBC 3.80 (L) 01/21/2021 1116   HGB 12.1 (L) 01/21/2021 1116   HGB 12.7 (L) 04/24/2019 1343   HCT 37.1 (L) 01/21/2021 1116   PLT 216.0 01/21/2021 1116   PLT 285 04/24/2019 1343   MCV 97.5 01/21/2021 1116   MCH 28.8 04/24/2019 1343   MCHC 32.7 01/21/2021 1116   RDW 14.5 01/21/2021 1116   LYMPHSABS 1.4 01/21/2021 1116   MONOABS 0.8 01/21/2021 1116   EOSABS 0.4 01/21/2021 1116   BASOSABS 0.1 01/21/2021 1116    BMET    Component Value Date/Time   NA 138 04/15/2021 1232   NA 139 02/23/2021 1203   K 5.0 Hemolysis seen 04/15/2021 1232   CL 106 04/15/2021 1232   CO2  28 04/15/2021 1232   GLUCOSE 84 04/15/2021 1232   BUN 16 04/15/2021 1232   BUN 15 02/23/2021 1203   CREATININE 1.49 04/15/2021 1232   CREATININE 1.55 (H) 04/24/2019 1343   CREATININE 1.40 (H) 04/12/2019 1317   CALCIUM 9.1 04/15/2021 1232   GFRNONAA 38 (L) 11/02/2019 1501   GFRNONAA 42 (L) 04/24/2019 1343   GFRNONAA 48 (L) 04/12/2019 1317   GFRAA 44 (L) 11/02/2019 1501   GFRAA 49 (L) 04/24/2019 1343   GFRAA 55 (L) 04/12/2019 1317    BNP    Component Value Date/Time   BNP 157.9 (H) 06/04/2019 1657    ProBNP    Component Value Date/Time   PROBNP 215.0 (H) 12/27/2011 1155    Imaging: No results found.   Assessment & Plan:   Obstructive sleep apnea - Patient has moderate obstructive sleep apnea, home sleep study on 07/02/2021>> AHI 27.8 an hour with SPO2 low 84%. Received new CPAP machine 6 to 8 weeks ago.  He is mostly compliant with use.  No issues with pressure settings or mask fit. Average usage days used 4 hours and 51 minutes.  Pressure 5 to 15 cm H2O (9.8 cm H2O-95th %);  AHI 7.2 an hour.  He is having more central sleep apneas then obstructive. Hx congestive heart failure, he has trace edema on exam and takes lasix as needed. He is sleeping well at night without residual daytime sleepiness.  No changes to pressure settings at this time.  Continue to encourage patient wear CPAP every night for minimum 4 to 6 hours.  Follow-up in 6 months with Dr. Craige Cotta or sooner.  CHRONIC SYSTOLIC HEART FAILURE - No shortness of breath symptoms.  Trace lower extremity edema on exam.  Continue as needed Lasix 40 mg as needed daily.   [place], NP 09/08/2021

## 2021-09-30 ENCOUNTER — Ambulatory Visit (INDEPENDENT_AMBULATORY_CARE_PROVIDER_SITE_OTHER): Payer: Medicare Other | Admitting: *Deleted

## 2021-09-30 DIAGNOSIS — E538 Deficiency of other specified B group vitamins: Secondary | ICD-10-CM

## 2021-09-30 DIAGNOSIS — G4733 Obstructive sleep apnea (adult) (pediatric): Secondary | ICD-10-CM | POA: Diagnosis not present

## 2021-09-30 MED ORDER — CYANOCOBALAMIN 1000 MCG/ML IJ SOLN
1000.0000 ug | Freq: Once | INTRAMUSCULAR | Status: AC
  Administered 2021-09-30: 1000 ug via INTRAMUSCULAR

## 2021-09-30 NOTE — Progress Notes (Signed)
Patient here for monthly b12 injection per PCP orders.  Injection given in right deltoid and patient tolerated well.  Patient was due for follow up with provider so will schedule next month and will get next injection at that time.

## 2021-10-13 DIAGNOSIS — M25552 Pain in left hip: Secondary | ICD-10-CM | POA: Diagnosis not present

## 2021-10-13 DIAGNOSIS — M5136 Other intervertebral disc degeneration, lumbar region: Secondary | ICD-10-CM | POA: Diagnosis not present

## 2021-10-13 DIAGNOSIS — N1832 Chronic kidney disease, stage 3b: Secondary | ICD-10-CM | POA: Diagnosis not present

## 2021-10-13 DIAGNOSIS — Z79899 Other long term (current) drug therapy: Secondary | ICD-10-CM | POA: Diagnosis not present

## 2021-10-13 DIAGNOSIS — R21 Rash and other nonspecific skin eruption: Secondary | ICD-10-CM | POA: Diagnosis not present

## 2021-10-13 DIAGNOSIS — M0579 Rheumatoid arthritis with rheumatoid factor of multiple sites without organ or systems involvement: Secondary | ICD-10-CM | POA: Diagnosis not present

## 2021-10-15 ENCOUNTER — Other Ambulatory Visit: Payer: Self-pay | Admitting: Family

## 2021-10-27 ENCOUNTER — Other Ambulatory Visit: Payer: Self-pay | Admitting: Family

## 2021-10-28 DIAGNOSIS — Z961 Presence of intraocular lens: Secondary | ICD-10-CM | POA: Diagnosis not present

## 2021-10-28 DIAGNOSIS — H5201 Hypermetropia, right eye: Secondary | ICD-10-CM | POA: Diagnosis not present

## 2021-10-28 DIAGNOSIS — H52203 Unspecified astigmatism, bilateral: Secondary | ICD-10-CM | POA: Diagnosis not present

## 2021-10-28 DIAGNOSIS — H524 Presbyopia: Secondary | ICD-10-CM | POA: Diagnosis not present

## 2021-10-28 DIAGNOSIS — M0689 Other specified rheumatoid arthritis, multiple sites: Secondary | ICD-10-CM | POA: Diagnosis not present

## 2021-10-28 DIAGNOSIS — H43813 Vitreous degeneration, bilateral: Secondary | ICD-10-CM | POA: Diagnosis not present

## 2021-10-31 DIAGNOSIS — G4733 Obstructive sleep apnea (adult) (pediatric): Secondary | ICD-10-CM | POA: Diagnosis not present

## 2021-11-03 ENCOUNTER — Ambulatory Visit (INDEPENDENT_AMBULATORY_CARE_PROVIDER_SITE_OTHER): Payer: Medicare Other | Admitting: Family

## 2021-11-03 VITALS — BP 130/59 | HR 64 | Temp 98.6°F | Resp 16 | Wt 237.0 lb

## 2021-11-03 DIAGNOSIS — I10 Essential (primary) hypertension: Secondary | ICD-10-CM | POA: Diagnosis not present

## 2021-11-03 DIAGNOSIS — N401 Enlarged prostate with lower urinary tract symptoms: Secondary | ICD-10-CM

## 2021-11-03 DIAGNOSIS — E538 Deficiency of other specified B group vitamins: Secondary | ICD-10-CM

## 2021-11-03 DIAGNOSIS — N1831 Chronic kidney disease, stage 3a: Secondary | ICD-10-CM

## 2021-11-03 DIAGNOSIS — R739 Hyperglycemia, unspecified: Secondary | ICD-10-CM

## 2021-11-03 DIAGNOSIS — I428 Other cardiomyopathies: Secondary | ICD-10-CM

## 2021-11-03 DIAGNOSIS — M069 Rheumatoid arthritis, unspecified: Secondary | ICD-10-CM | POA: Diagnosis not present

## 2021-11-03 DIAGNOSIS — J45909 Unspecified asthma, uncomplicated: Secondary | ICD-10-CM | POA: Diagnosis not present

## 2021-11-03 DIAGNOSIS — G4733 Obstructive sleep apnea (adult) (pediatric): Secondary | ICD-10-CM

## 2021-11-03 MED ORDER — ALBUTEROL SULFATE HFA 108 (90 BASE) MCG/ACT IN AERS
1.0000 | INHALATION_SPRAY | Freq: Four times a day (QID) | RESPIRATORY_TRACT | 0 refills | Status: DC | PRN
Start: 2021-11-03 — End: 2022-06-29

## 2021-11-03 MED ORDER — TAMSULOSIN HCL 0.4 MG PO CAPS: 0.4000 mg | ORAL_CAPSULE | Freq: Every day | ORAL | 1 refills | Status: DC

## 2021-11-03 MED ORDER — MONTELUKAST SODIUM 10 MG PO TABS: 10.0000 mg | ORAL_TABLET | Freq: Every day | ORAL | 1 refills | Status: DC

## 2021-11-03 MED ORDER — SPIRONOLACTONE 25 MG PO TABS
25.0000 mg | ORAL_TABLET | Freq: Every day | ORAL | 1 refills | Status: DC
Start: 2021-11-03 — End: 2022-06-29

## 2021-11-03 MED ORDER — CYANOCOBALAMIN 1000 MCG/ML IJ SOLN
1000.0000 ug | Freq: Once | INTRAMUSCULAR | Status: AC
  Administered 2021-11-03: 1000 ug via INTRAMUSCULAR

## 2021-11-03 MED ORDER — METOPROLOL SUCCINATE ER 25 MG PO TB24: 25.0000 mg | ORAL_TABLET | Freq: Every day | ORAL | 1 refills | Status: DC

## 2021-11-03 NOTE — Progress Notes (Signed)
Subjective:     Patient ID: Brandon [name], male    DOB: [birth year], 81 y.o.   MRN: [id number]  Chief Complaint  Patient presents with   Hypertension    Here for follow up   B12 deficiency    due for monthly B12 today    HPI Patient is in today for follow up.  RA- following with Dr. Amil Amen. Reports symptoms are stable. Requesting CMET/CBC per Dr. Amil Amen.  B12- due for injection today.  BPH- reports stable on flomax.   HTN-  BP Readings from Last 3 Encounters:  11/03/21 (!) 130/59  09/08/21 124/76  07/10/21 122/76   On metoprolol and aldactone.   OSA- using cpap.  Reports good compliance. Feels rested I nthe AM.  Health Maintenance Due  Topic Date Due   Zoster Vaccines- Shingrix (1 of 2) Never done   COVID-19 Vaccine (6 - Moderna risk series) 02/09/2021   INFLUENZA VACCINE  10/13/2021    Past Medical History:  Diagnosis Date   Anemia    Asthma    Hx of childhood asthma, disappeared for a while, then resurfaced 6-7 years ago.    Cardiomyopathy    with a negative cardiac catheterization in the past. 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     Past Surgical History:  Procedure Laterality Date   None      Family History  Problem Relation Age of Onset   Cancer Father        multiple melanoma   Lupus Sister        died at 24   Diabetes Mellitus II Sister        died from covid-19   Asthma Daughter    Neuropathy Sister    Colon cancer Neg Hx    Esophageal cancer Neg Hx     Social History   Socioeconomic History   Marital status: Married    Spouse name: [name]   Number of children: 3   Years of education: Not on file   Highest education level: Not on file  Occupational History   Occupation: retired  Tobacco Use   Smoking status: Former   Smokeless tobacco: Never    Tobacco comments:    quit smoling in May 21, 1988. started when 18, 1 ppd  Vaping Use   Vaping Use: Never used  Substance and Sexual Activity   Alcohol use: Yes    Comment: occ   Drug use: Never   Sexual activity: Not on file  Other Topics Concern   Not on file  Social History Narrative   Grew up in [place], attended [employer]. First wife died of breast ca in May 21, 1997. 3 children. Remarried- 8 years. 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    Pt signed designated party release granting access to [facility] to his wife Tonia Ghent. Detailed message may be left on home or cell phone. Shanon Payor August 13, 2009 11:34 am.    Social Determinants of Health   Financial Resource Strain: Medium Risk (03/19/2021)   Overall Financial Resource Strain (CARDIA)    Difficulty of Paying Living Expenses: Somewhat hard  Food Insecurity: Not on file  Transportation Needs: No Transportation Needs (05/28/2021)   PRAPARE - Transportation    Lack of  Transportation (Medical): No    Lack of Transportation (Non-Medical): No  Physical Activity: Insufficiently Active (07/25/2020)   Exercise Vital Sign    Days of Exercise per Week: 5 days    Minutes of Exercise per Session: 10 min  Stress: No Stress Concern Present (05/28/2021)   [place]    Feeling of Stress : Not at all  Social Connections: [place] (05/28/2021)   Social Connection and Isolation Panel [NHANES]    Frequency of Communication with Friends and Family: More than three times a week    Frequency of Social Gatherings with Friends and Family: Three times a week    Attends Religious Services: More than 4 times per year    Active Member of Clubs or Organizations: Yes    Attends Archivist Meetings: More than 4 times per year    Marital Status: Married  Intimate Partner Violence: Unknown (05/28/2021)   Humiliation, Afraid, Rape, and Kick questionnaire     Fear of Current or Ex-Partner: No    Emotionally Abused: No    Physically Abused: No    Sexually Abused: Not on file    Outpatient Medications Prior to Visit  Medication Sig Dispense Refill   albuterol (PROVENTIL) (2.5 MG/3ML) 0.083% nebulizer solution Take 3 mLs (2.5 mg total) by nebulization every 6 (six) hours as needed for wheezing or shortness of breath. 150 mL 1   amLODipine (NORVASC) 10 MG tablet Take 1 tablet by mouth once daily 90 tablet 0   aspirin 81 MG tablet Take 81 mg by mouth daily.     b complex vitamins tablet Take 1 tablet by mouth daily.     fluticasone-salmeterol (ADVAIR) 500-50 MCG/ACT AEPB INHALE 1 DOSE BY MOUTH IN THE MORNING AND AT BEDTIME 60 each 0   furosemide (LASIX) 40 MG tablet Take 1 tablet (40 mg total) by mouth daily as needed. 90 tablet 1   leflunomide (ARAVA) 20 MG tablet Take 20 mg by mouth daily.     sacubitril-valsartan (ENTRESTO) 49-51 MG Take 1 tablet by mouth 2 (two) times daily. 180 tablet 3   albuterol (VENTOLIN HFA) 108 (90 Base) MCG/ACT inhaler Inhale 1-2 puffs into the lungs every 6 (six) hours as needed for wheezing or shortness of breath. 8 g 0   metoprolol succinate (TOPROL-XL) 25 MG 24 hr tablet Take 1 tablet by mouth once daily 90 tablet 0   montelukast (SINGULAIR) 10 MG tablet TAKE 1 TABLET BY MOUTH AT BEDTIME 90 tablet 0   spironolactone (ALDACTONE) 25 MG tablet Take 1 tablet (25 mg total) by mouth daily. 90 tablet 0   tamsulosin (FLOMAX) 0.4 MG CAPS capsule Take 1 capsule by mouth once daily 90 capsule 0   No facility-administered medications prior to visit.    Allergies  Allergen Reactions   Ace Inhibitors Cough   Isosorb Dinitrate-Hydralazine Other (See Comments)    REACTION: dizziness/hypotensive    ROS    See HPI Objective:    Physical Exam Constitutional:      General: He is not in acute distress.    Appearance: He is well-developed.  HENT:     Head: Normocephalic and atraumatic.  Cardiovascular:     Rate and  Rhythm: Normal rate and regular rhythm.     Heart sounds: No murmur heard. Pulmonary:     Effort: Pulmonary effort is normal. No respiratory distress.     Breath sounds: Normal breath sounds. No wheezing or rales.  Skin:  General: Skin is warm and dry.  Neurological:     Mental Status: He is alert and oriented to person, place, and time.  Psychiatric:        Behavior: Behavior normal.        Thought Content: Thought content normal.     BP (!) 130/59 (BP Location: Right Arm, Patient Position: Sitting, Cuff Size: Large)   Pulse 64   Temp 98.6 F (37 C) (Oral)   Resp 16   Wt 237 lb (107.5 kg)   SpO2 99%   BMI 33.05 kg/m  Wt Readings from Last 3 Encounters:  11/03/21 237 lb (107.5 kg)  09/08/21 233 lb 6.4 oz (105.9 kg)  07/10/21 233 lb (105.7 kg)       Assessment & Plan:   Problem List Items Addressed This Visit       Unprioritized   Stage 3a chronic kidney disease ([place])    Check renal function.       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       Relevant Orders   CBC with Differential/Platelet   Comp Met (CMET)   Obstructive sleep apnea    Reports good compliance with CPAP.       Nonischemic cardiomyopathy (HCC)    Clinically compensated. Has follow up this week with cardiology.       Relevant Medications   spironolactone (ALDACTONE) 25 MG tablet   metoprolol succinate (TOPROL-XL) 25 MG 24 hr tablet   Hyperglycemia   Relevant Orders   Hemoglobin U7M   Folic acid deficiency   Relevant Orders   Folate   Essential hypertension    bp stable. Continue metoprolol and aldactone.       Relevant Medications   spironolactone (ALDACTONE) 25 MG tablet   metoprolol succinate (TOPROL-XL) 25 MG 24 hr tablet   BPH (benign prostatic hyperplasia)    Stable on flomax.      Relevant Medications   tamsulosin (FLOMAX) 0.4 MG CAPS capsule   B12 deficiency - Primary    Continues monthly b12 injections.      Asthma    Stable,  continue albuterol/singulair.       Relevant Medications   montelukast (SINGULAIR) 10 MG tablet   albuterol (VENTOLIN HFA) 108 (90 Base) MCG/ACT inhaler    I have changed Tyreik E. Holzheimer's tamsulosin, montelukast, and metoprolol succinate. I am also having him maintain his aspirin, b complex vitamins, leflunomide, furosemide, Entresto, albuterol, fluticasone-salmeterol, amLODipine, spironolactone, and albuterol. We administered cyanocobalamin.  Meds ordered this encounter  Medications   cyanocobalamin (VITAMIN B12) injection 1,000 mcg   tamsulosin (FLOMAX) 0.4 MG CAPS capsule    Sig: Take 1 capsule (0.4 mg total) by mouth daily.    Dispense:  90 capsule    Refill:  1    Order Specific Question:   Supervising Provider    Answer:   Penni Homans A [[id number]]   spironolactone (ALDACTONE) 25 MG tablet    Sig: Take 1 tablet (25 mg total) by mouth daily.    Dispense:  90 tablet    Refill:  1    Order Specific Question:   Supervising Provider    Answer:   Penni Homans A [[id number]]   montelukast (SINGULAIR) 10 MG tablet    Sig: Take 1 tablet (10 mg total) by mouth at bedtime.    Dispense:  90 tablet    Refill:  1    Order Specific Question:   Supervising Provider  Answer:   Penni Homans A [[id number]]   metoprolol succinate (TOPROL-XL) 25 MG 24 hr tablet    Sig: Take 1 tablet (25 mg total) by mouth daily.    Dispense:  90 tablet    Refill:  1    Order Specific Question:   Supervising Provider    Answer:   Penni Homans A [[id number]]   albuterol (VENTOLIN HFA) 108 (90 Base) MCG/ACT inhaler    Sig: Inhale 1-2 puffs into the lungs every 6 (six) hours as needed for wheezing or shortness of breath.    Dispense:  8 g    Refill:  0    Please feel albuterol inhaler preferred by patient's insurance plan    Order Specific Question:   Supervising Provider    Answer:   Penni Homans A [[id number]]

## 2021-11-03 NOTE — Assessment & Plan Note (Signed)
Check renal function. 

## 2021-11-03 NOTE — Assessment & Plan Note (Signed)
bp stable. Continue metoprolol and aldactone.

## 2021-11-03 NOTE — Assessment & Plan Note (Signed)
Clinically compensated. Has follow up this week with cardiology.

## 2021-11-03 NOTE — Assessment & Plan Note (Signed)
Stable, management per Rheumatology.

## 2021-11-03 NOTE — Assessment & Plan Note (Signed)
Continues monthly b12 injections.  

## 2021-11-03 NOTE — Assessment & Plan Note (Signed)
Stable on flomax.   

## 2021-11-03 NOTE — Assessment & Plan Note (Signed)
Stable, continue albuterol/singulair.

## 2021-11-03 NOTE — Assessment & Plan Note (Signed)
Reports good compliance with CPAP 

## 2021-11-04 ENCOUNTER — Encounter: Payer: Self-pay | Admitting: Family

## 2021-11-04 LAB — CBC WITH DIFFERENTIAL/PLATELET
Basophils Absolute: 0.1 10*3/uL (ref 0.0–0.1)
Basophils Relative: 0.8 % (ref 0.0–3.0)
Eosinophils Absolute: 0.2 10*3/uL (ref 0.0–0.7)
Eosinophils Relative: 3.1 % (ref 0.0–5.0)
HCT: 36.2 % — ABNORMAL LOW (ref 39.0–52.0)
Hemoglobin: 11.8 g/dL — ABNORMAL LOW (ref 13.0–17.0)
Lymphocytes Relative: 14.8 % (ref 12.0–46.0)
Lymphs Abs: 1 10*3/uL (ref 0.7–4.0)
MCHC: 32.7 g/dL (ref 30.0–36.0)
MCV: 97.2 fl (ref 78.0–100.0)
Monocytes Absolute: 0.7 10*3/uL (ref 0.1–1.0)
Monocytes Relative: 10.4 % (ref 3.0–12.0)
Neutro Abs: 4.7 10*3/uL (ref 1.4–7.7)
Neutrophils Relative %: 70.9 % (ref 43.0–77.0)
Platelets: 172 10*3/uL (ref 150.0–400.0)
RBC: 3.72 Mil/uL — ABNORMAL LOW (ref 4.22–5.81)
RDW: 15.1 % (ref 11.5–15.5)
WBC: 6.6 10*3/uL (ref 4.0–10.5)

## 2021-11-04 LAB — COMPREHENSIVE METABOLIC PANEL
ALT: 10 U/L (ref 0–53)
AST: 11 U/L (ref 0–37)
Albumin: 3.6 g/dL (ref 3.5–5.2)
Alkaline Phosphatase: 60 U/L (ref 39–117)
BUN: 12 mg/dL (ref 6–23)
CO2: 29 mEq/L (ref 19–32)
Calcium: 9.2 mg/dL (ref 8.4–10.5)
Chloride: 105 mEq/L (ref 96–112)
Creatinine, Ser: 1.38 mg/dL (ref 0.40–1.50)
GFR: 48.18 mL/min — ABNORMAL LOW (ref >60.00)
Glucose, Bld: 88 mg/dL (ref 70–99)
Potassium: 4.8 mEq/L (ref 3.5–5.1)
Sodium: 140 mEq/L (ref 135–145)
Total Bilirubin: 0.7 mg/dL (ref 0.2–1.2)
Total Protein: 5.8 g/dL — ABNORMAL LOW (ref 6.0–8.3)

## 2021-11-04 LAB — HEMOGLOBIN A1C: Hgb A1c MFr Bld: 5.5 % (ref 4.6–6.5)

## 2021-11-04 LAB — FOLATE: Folate: >24.2 ng/mL (ref >5.9)

## 2021-11-05 NOTE — Progress Notes (Signed)
Cardiology Office Note   Date:  11/06/2021   ID:  Brandon [name], DOB 27-Jan-1941, MRN [id number]  PCP:  [name], NP  Cardiologist:   [name], MD   Chief Complaint  Patient presents with   Cardiomyopathy     History of Present Illness: Brandon [name] is a 81 y.o. male who presents for follow up of cardiomyopathy with an EF in the past of 45 - 50%.  At the last visit I did switch him to [facility] when he did well with this.  He had follow-up of his chemistry and this was fine.  He does have some mild renal insufficiency.  He has done well since I last saw him.  He was able to do some golfing a couple of times this summer 2018 holes.  This is good for him.  He has not had any new shortness of breath, PND or orthopnea.  He had no new palpitations, presyncope or syncope.  He has had no weight gain or edema.   Past Medical History:  Diagnosis Date   Anemia    Asthma    Hx of childhood asthma, disappeared for a while, then resurfaced 6-7 years ago.    Cardiomyopathy    with a negative cardiac catheterization in the past. 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     Past Surgical History:  Procedure Laterality Date   None       Current Outpatient Medications  Medication Sig Dispense Refill   albuterol (PROVENTIL) (2.5 MG/3ML) 0.083% nebulizer solution Take 3 mLs (2.5 mg total) by nebulization every 6 (six) hours as needed for wheezing or shortness of breath. 150 mL 1   albuterol (VENTOLIN HFA) 108 (90 Base) MCG/ACT inhaler Inhale 1-2 puffs into the lungs every 6 (six) hours as needed for wheezing or shortness of breath. 8 g 0   amLODipine (NORVASC) 10 MG tablet Take 1 tablet by mouth once daily 90 tablet 0   aspirin 81 MG tablet Take 81 mg by mouth daily.     b complex vitamins  tablet Take 1 tablet by mouth daily.     fluticasone-salmeterol (ADVAIR) 500-50 MCG/ACT AEPB INHALE 1 DOSE BY MOUTH IN THE MORNING AND AT BEDTIME 60 each 0   furosemide (LASIX) 40 MG tablet Take 1 tablet (40 mg total) by mouth daily as needed. 90 tablet 1   leflunomide (ARAVA) 20 MG tablet Take 20 mg by mouth daily.     metoprolol succinate (TOPROL-XL) 25 MG 24 hr tablet Take 1 tablet (25 mg total) by mouth daily. 90 tablet 1   montelukast (SINGULAIR) 10 MG tablet Take 1 tablet (10 mg total) by mouth at bedtime. 90 tablet 1   sacubitril-valsartan (ENTRESTO) 97-103 MG Take 1 tablet by mouth 2 (two) times daily. 180 tablet 3   spironolactone (ALDACTONE) 25 MG tablet Take 1 tablet (25 mg total) by mouth daily. 90 tablet 1   tamsulosin (FLOMAX) 0.4 MG CAPS capsule Take 1 capsule (0.4 mg total) by mouth daily. 90 capsule 1   No current facility-administered medications for this visit.    Allergies:   Ace inhibitors and Isosorb dinitrate-hydralazine    ROS:  Please see the history of present illness.   Otherwise, review  of systems are positive for none.   All other systems are reviewed and negative.    PHYSICAL EXAM: VS:  BP 122/78   Pulse 69   Ht 5\' 11"  (1.803 m)   Wt 235 lb 12.8 oz (107 kg)   SpO2 98%   BMI 32.89 kg/m  , BMI Body mass index is 32.89 kg/m. GENERAL:  Well appearing NECK:  No jugular venous distention, waveform within normal limits, carotid upstroke brisk and symmetric, no bruits, no thyromegaly LUNGS:  Clear to auscultation bilaterally CHEST:  Unremarkable HEART:  PMI not displaced or sustained,S1 and S2 within normal limits, no S3, no S4, no clicks, no rubs, no murmurs ABD:  Flat, positive bowel sounds normal in frequency in pitch, no bruits, no rebound, no guarding, no midline pulsatile mass, no hepatomegaly, no splenomegaly EXT:  2 plus pulses throughout, no edema, no cyanosis no clubbing    EKG:  EKG  ordered today. Sinus rhythm, rate 69, premature ventricular  contractions, right bundle branch block, no acute ST-T wave changes.    Recent Labs: 11/03/2021: ALT 10; BUN 12; Creatinine, Ser 1.38; Hemoglobin 11.8; Platelets 172.0; Potassium 4.8; Sodium 140    Lipid Panel    Component Value Date/Time   CHOL 151 08/12/2020 1714   TRIG 51.0 08/12/2020 1714   HDL 42.30 08/12/2020 1714   CHOLHDL 4 08/12/2020 1714   VLDL 10.2 08/12/2020 1714   LDLCALC 98 08/12/2020 1714      Wt Readings from Last 3 Encounters:  11/06/21 235 lb 12.8 oz (107 kg)  11/03/21 237 lb (107.5 kg)  09/08/21 233 lb 6.4 oz (105.9 kg)      Other studies Reviewed: Additional studies/ records that were reviewed today include: Labs. Review of the above records demonstrates:  Please see elsewhere in the note.     ASSESSMENT AND PLAN:  Leg edema:   This is improved from previous and he uses the Lasix only as needed.  Chronic systolic heart failure:     I am going to increase his Entresto to 97/103 twice daily.   Nonischemic cardiomyopathy:   This is being managed as above  PVCs:   He does not feel these.  No change in therapy.  Hypertension:  This is being managed in the context of treating his CHF   Obstructive sleep apnea:   He uses CPAP.  He just got a new mask.   CKD stage III: This has been stable and actually his creatinine was down from previous from last check.  It was 1.31.     Current medicines are reviewed at length with the patient today.  The patient does not have concerns regarding medicines.  The following changes have been made:  None  Labs/ tests ordered today include:  None  Orders Placed This Encounter  Procedures   EKG 12-Lead    Disposition:   FU with me in six months.    Signed, 09/10/21, MD  11/06/2021 2:38 PM    [facility]

## 2021-11-05 NOTE — Progress Notes (Signed)
Mailed out to patient 

## 2021-11-06 ENCOUNTER — Ambulatory Visit: Payer: Medicare Other | Admitting: Cardiology

## 2021-11-06 ENCOUNTER — Encounter: Payer: Self-pay | Admitting: Cardiology

## 2021-11-06 VITALS — BP 122/78 | HR 69 | Ht 71.0 in | Wt 235.8 lb

## 2021-11-06 DIAGNOSIS — I10 Essential (primary) hypertension: Secondary | ICD-10-CM

## 2021-11-06 DIAGNOSIS — M7989 Other specified soft tissue disorders: Secondary | ICD-10-CM | POA: Diagnosis not present

## 2021-11-06 DIAGNOSIS — N1831 Chronic kidney disease, stage 3a: Secondary | ICD-10-CM

## 2021-11-06 DIAGNOSIS — I5022 Chronic systolic (congestive) heart failure: Secondary | ICD-10-CM

## 2021-11-06 MED ORDER — SACUBITRIL-VALSARTAN 97-103 MG PO TABS
1.0000 | ORAL_TABLET | Freq: Two times a day (BID) | ORAL | 3 refills | Status: DC
Start: 2021-11-06 — End: 2021-11-20

## 2021-11-06 NOTE — Patient Instructions (Signed)
Medication Instructions:   -Increase sacubitril-valsartan to 97-103mg  twice daily.  *If you need a refill on your cardiac medications before your next appointment, please call your pharmacy*   Follow-Up: At [facility], you and your health needs are our priority.  As part of our continuing mission to provide you with exceptional heart care, we have created designated Provider Care Teams.  These Care Teams include your primary Cardiologist (physician) and Advanced Practice Providers (APPs -  Physician Assistants and Nurse Practitioners) who all work together to provide you with the care you need, when you need it.  We recommend signing up for the patient portal called "MyChart".  Sign up information is provided on this After Visit Summary.  MyChart is used to connect with patients for Virtual Visits (Telemedicine).  Patients are able to view lab/test results, encounter notes, upcoming appointments, etc.  Non-urgent messages can be sent to your provider as well.   To learn more about what you can do with MyChart, go to [email or web address].    Your next appointment:   6 month(s)  The format for your next appointment:   In Person  Provider:   [name], MD

## 2021-11-19 ENCOUNTER — Telehealth: Payer: Self-pay | Admitting: Pharmacist

## 2021-11-19 NOTE — Telephone Encounter (Signed)
Patient called to report that Dr Kirtland Bouchard increased dose of Entresto to 97/109mg  twice a day.  Patient has been getting Entresto 49/51mg  dose from [employer] medication assistance program.  Need to [employer] and updated with new prescription.

## 2021-11-20 ENCOUNTER — Telehealth: Payer: Self-pay | Admitting: Cardiology

## 2021-11-20 MED ORDER — SACUBITRIL-VALSARTAN 97-103 MG PO TABS: 1.0000 | ORAL_TABLET | Freq: Two times a day (BID) | ORAL | 1 refills | Status: DC

## 2021-11-20 NOTE — Telephone Encounter (Signed)
Spoke with [employer] patient assistance program. They state updated prescription for new Entresto strength just needs to be sent to [facility] in [place] since they fill meds for [employer] medication assistance program. I sent Rx but I it actually needs to be sent from Dr [employer] office since he signed the original medication assistance program application. Called cardio office and requested updated Rx.

## 2021-11-20 NOTE — Telephone Encounter (Signed)
*  STAT* If patient is at the pharmacy, call can be transferred to refill team.   1. Which medications need to be refilled? (please list name of each medication and dose if known) sacubitril-valsartan (ENTRESTO) 97-103 MG  2. Which pharmacy/location (including street and city if local pharmacy) is medication to be sent to? [facility] [employer] [facility] - [place], [place] - [address]  3. Do they need a 30 day or 90 day supply? 90.

## 2021-11-24 ENCOUNTER — Telehealth: Payer: Self-pay | Admitting: Cardiology

## 2021-11-24 NOTE — Telephone Encounter (Signed)
Pharmacist aware okay to refill both, entresto and spironolactone.

## 2021-11-24 NOTE — Telephone Encounter (Signed)
Pt c/o medication issue:  1. Name of Medication:   sacubitril-valsartan (ENTRESTO) 97-103 MG  spironolactone (ALDACTONE) 25 MG tablet  2. How are you currently taking this medication (dosage and times per day)?   3. Are you having a reaction (difficulty breathing--STAT)?   4. What is your medication issue? Pharmacy called wanting to inform that there is an interaction between these two medications

## 2021-11-25 ENCOUNTER — Telehealth: Payer: Self-pay | Admitting: Family

## 2021-11-25 DIAGNOSIS — J3489 Other specified disorders of nose and nasal sinuses: Secondary | ICD-10-CM

## 2021-11-25 NOTE — Telephone Encounter (Signed)
Pt states he would like a new ENT referral. 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

## 2021-11-26 NOTE — Telephone Encounter (Signed)
Referral has been placed. 

## 2021-11-27 NOTE — Telephone Encounter (Signed)
Patient advised of referral  

## 2021-11-30 DIAGNOSIS — J340 Abscess, furuncle and carbuncle of nose: Secondary | ICD-10-CM | POA: Diagnosis not present

## 2021-12-01 DIAGNOSIS — G4733 Obstructive sleep apnea (adult) (pediatric): Secondary | ICD-10-CM | POA: Diagnosis not present

## 2021-12-02 DIAGNOSIS — M1612 Unilateral primary osteoarthritis, left hip: Secondary | ICD-10-CM | POA: Diagnosis not present

## 2021-12-03 DIAGNOSIS — G4733 Obstructive sleep apnea (adult) (pediatric): Secondary | ICD-10-CM | POA: Diagnosis not present

## 2021-12-04 ENCOUNTER — Ambulatory Visit (INDEPENDENT_AMBULATORY_CARE_PROVIDER_SITE_OTHER): Payer: Medicare Other

## 2021-12-04 DIAGNOSIS — E538 Deficiency of other specified B group vitamins: Secondary | ICD-10-CM | POA: Diagnosis not present

## 2021-12-04 MED ORDER — CYANOCOBALAMIN 1000 MCG/ML IJ SOLN
1000.0000 ug | Freq: Once | INTRAMUSCULAR | Status: AC
  Administered 2021-12-04: 1000 ug via INTRAMUSCULAR

## 2021-12-04 NOTE — Progress Notes (Signed)
Brandon [name] is a 81 y.o. male presents to the office today for B12 shot per physician's orders. Original order: on last office visit  to continue monthly B12.  cyanocobalamin (med), 1000 mg/ml (dose),  IM (route) was administered Left deltoid (location) today. Patient tolerated injection.   Patient next injection due: in one month, appt made Yes scheduled for 01/01/22.  [name]

## 2021-12-18 ENCOUNTER — Other Ambulatory Visit: Payer: Self-pay | Admitting: Family

## 2021-12-30 DIAGNOSIS — M1612 Unilateral primary osteoarthritis, left hip: Secondary | ICD-10-CM | POA: Diagnosis not present

## 2021-12-31 DIAGNOSIS — G4733 Obstructive sleep apnea (adult) (pediatric): Secondary | ICD-10-CM | POA: Diagnosis not present

## 2022-01-01 ENCOUNTER — Ambulatory Visit: Payer: Medicare Other

## 2022-01-07 DIAGNOSIS — K0889 Other specified disorders of teeth and supporting structures: Secondary | ICD-10-CM | POA: Diagnosis not present

## 2022-01-20 ENCOUNTER — Other Ambulatory Visit: Payer: Self-pay | Admitting: Family

## 2022-01-28 ENCOUNTER — Ambulatory Visit (INDEPENDENT_AMBULATORY_CARE_PROVIDER_SITE_OTHER): Payer: Medicare Other

## 2022-01-28 DIAGNOSIS — E538 Deficiency of other specified B group vitamins: Secondary | ICD-10-CM

## 2022-01-28 MED ORDER — CYANOCOBALAMIN 1000 MCG/ML IJ SOLN
1000.0000 ug | Freq: Once | INTRAMUSCULAR | Status: AC
  Administered 2022-01-28: 1000 ug via INTRAMUSCULAR

## 2022-01-28 NOTE — Progress Notes (Addendum)
Brandon [name] is a 81 y.o. male presents to the office today for B12 shot per physician's orders. Original order: on last office visit  to continue monthly B12.  cyanocobalamin (med), 1000 mg/ml (dose),  IM (route) was administered Left deltoid (location) today. Patient tolerated injection.     Patient next injection due: in one month, appt made Yes scheduled for

## 2022-01-29 ENCOUNTER — Other Ambulatory Visit: Payer: Self-pay

## 2022-01-29 MED ORDER — METOPROLOL SUCCINATE ER 25 MG PO TB24: 25.0000 mg | ORAL_TABLET | Freq: Every day | ORAL | 1 refills | Status: DC

## 2022-01-31 DIAGNOSIS — G4733 Obstructive sleep apnea (adult) (pediatric): Secondary | ICD-10-CM | POA: Diagnosis not present

## 2022-02-02 ENCOUNTER — Other Ambulatory Visit: Payer: Self-pay

## 2022-02-02 MED ORDER — AMLODIPINE BESYLATE 10 MG PO TABS: 10.0000 mg | ORAL_TABLET | Freq: Every day | ORAL | 0 refills | Status: DC

## 2022-02-03 DIAGNOSIS — M25552 Pain in left hip: Secondary | ICD-10-CM | POA: Diagnosis not present

## 2022-02-03 DIAGNOSIS — R21 Rash and other nonspecific skin eruption: Secondary | ICD-10-CM | POA: Diagnosis not present

## 2022-02-03 DIAGNOSIS — Z79899 Other long term (current) drug therapy: Secondary | ICD-10-CM | POA: Diagnosis not present

## 2022-02-03 DIAGNOSIS — M25522 Pain in left elbow: Secondary | ICD-10-CM | POA: Diagnosis not present

## 2022-02-03 DIAGNOSIS — M5136 Other intervertebral disc degeneration, lumbar region: Secondary | ICD-10-CM | POA: Diagnosis not present

## 2022-02-03 DIAGNOSIS — M0579 Rheumatoid arthritis with rheumatoid factor of multiple sites without organ or systems involvement: Secondary | ICD-10-CM | POA: Diagnosis not present

## 2022-02-03 DIAGNOSIS — N1832 Chronic kidney disease, stage 3b: Secondary | ICD-10-CM | POA: Diagnosis not present

## 2022-02-18 ENCOUNTER — Telehealth: Payer: Self-pay | Admitting: Family

## 2022-02-18 NOTE — Telephone Encounter (Signed)
Pt called stating that he had received an enrollment letter from [employer] regarding his Sherryll Burger. Pt stated he had filled out his portion to continue receiving it but needed prescriber info in order to complete it by 12.15.23. Please Advise.

## 2022-02-18 NOTE — Telephone Encounter (Signed)
Patient to drop medication assistance program paperwork by our office. Will complete or sent to cardio office to complete.

## 2022-02-19 ENCOUNTER — Telehealth: Payer: Self-pay | Admitting: Cardiology

## 2022-02-19 NOTE — Telephone Encounter (Signed)
Tammy called from [facility] in [place] stating the patient brought in assistance forms for Entresto that need to be signed by Dr. Antoine Poche and submitted by 12/15. She is faxing over the application to the attention of Stanton Kidney so that it can be submitted. Please advise.

## 2022-02-19 NOTE — Telephone Encounter (Signed)
Received patient's renewal application for Entresto.  Completed provider portion. Per letter must be received by 02/26/2022.

## 2022-02-19 NOTE — Telephone Encounter (Signed)
Spoke to cardio office to let them know I was faxing medication assistance program application for Entresto. Patient's portion has been completed, included financial and insurance information.  Dr Kirtland Bouchard will be covering hospital next week but office will try to get signature and send in before deadline of 02/26/2022

## 2022-02-23 ENCOUNTER — Other Ambulatory Visit: Payer: Self-pay | Admitting: Family

## 2022-02-26 ENCOUNTER — Ambulatory Visit (INDEPENDENT_AMBULATORY_CARE_PROVIDER_SITE_OTHER): Payer: Medicare Other | Admitting: *Deleted

## 2022-02-26 DIAGNOSIS — E538 Deficiency of other specified B group vitamins: Secondary | ICD-10-CM

## 2022-02-26 MED ORDER — CYANOCOBALAMIN 1000 MCG/ML IJ SOLN
1000.0000 ug | Freq: Once | INTRAMUSCULAR | Status: AC
  Administered 2022-02-26: 1000 ug via INTRAMUSCULAR

## 2022-02-26 NOTE — Progress Notes (Addendum)
Brandon [name] is a 81 y.o. male presents to the office today for monthly B12 shot per physician's orders.  Original order: on last office visit  to continue monthly B12.   Injection given in left deltoid and patient tolerated well.   Patient next injection due: in one month, appt made Yes scheduled for 03/30/22.

## 2022-03-02 DIAGNOSIS — G4733 Obstructive sleep apnea (adult) (pediatric): Secondary | ICD-10-CM | POA: Diagnosis not present

## 2022-03-22 ENCOUNTER — Telehealth: Payer: Self-pay | Admitting: Family

## 2022-03-22 ENCOUNTER — Ambulatory Visit (HOSPITAL_BASED_OUTPATIENT_CLINIC_OR_DEPARTMENT_OTHER)
Admission: RE | Admit: 2022-03-22 | Discharge: 2022-03-22 | Disposition: A | Discharge status: Home / Self Care | Payer: Medicare Other | Source: Ambulatory Visit | Attending: Family | Admitting: Family

## 2022-03-22 ENCOUNTER — Ambulatory Visit (INDEPENDENT_AMBULATORY_CARE_PROVIDER_SITE_OTHER): Payer: Medicare Other | Admitting: Family

## 2022-03-22 ENCOUNTER — Encounter: Payer: Self-pay | Admitting: Family

## 2022-03-22 VITALS — BP 126/83 | HR 81 | Temp 98.7°F | Resp 16 | Wt 230.0 lb

## 2022-03-22 DIAGNOSIS — J90 Pleural effusion, not elsewhere classified: Secondary | ICD-10-CM | POA: Diagnosis not present

## 2022-03-22 DIAGNOSIS — R0602 Shortness of breath: Secondary | ICD-10-CM | POA: Insufficient documentation

## 2022-03-22 DIAGNOSIS — R7989 Other specified abnormal findings of blood chemistry: Secondary | ICD-10-CM

## 2022-03-22 DIAGNOSIS — I509 Heart failure, unspecified: Secondary | ICD-10-CM | POA: Diagnosis not present

## 2022-03-22 LAB — CBC WITH DIFFERENTIAL/PLATELET
Basophils Absolute: 0.1 10*3/uL (ref 0.0–0.1)
Basophils Relative: 0.9 % (ref 0.0–3.0)
Eosinophils Absolute: 0.7 10*3/uL (ref 0.0–0.7)
Eosinophils Relative: 10.3 % — ABNORMAL HIGH (ref 0.0–5.0)
HCT: 36.4 % — ABNORMAL LOW (ref 39.0–52.0)
Hemoglobin: 12.1 g/dL — ABNORMAL LOW (ref 13.0–17.0)
Lymphocytes Relative: 16.8 % (ref 12.0–46.0)
Lymphs Abs: 1.2 10*3/uL (ref 0.7–4.0)
MCHC: 33.1 g/dL (ref 30.0–36.0)
MCV: 96.4 fl (ref 78.0–100.0)
Monocytes Absolute: 0.9 10*3/uL (ref 0.1–1.0)
Monocytes Relative: 12.4 % — ABNORMAL HIGH (ref 3.0–12.0)
Neutro Abs: 4.1 10*3/uL (ref 1.4–7.7)
Neutrophils Relative %: 59.6 % (ref 43.0–77.0)
Platelets: 174 10*3/uL (ref 150.0–400.0)
RBC: 3.78 Mil/uL — ABNORMAL LOW (ref 4.22–5.81)
RDW: 14.6 % (ref 11.5–15.5)
WBC: 6.9 10*3/uL (ref 4.0–10.5)

## 2022-03-22 LAB — COMPREHENSIVE METABOLIC PANEL
ALT: 10 U/L (ref 0–53)
AST: 13 U/L (ref 0–37)
Albumin: 3.8 g/dL (ref 3.5–5.2)
Alkaline Phosphatase: 63 U/L (ref 39–117)
BUN: 12 mg/dL (ref 6–23)
CO2: 27 mEq/L (ref 19–32)
Calcium: 9.4 mg/dL (ref 8.4–10.5)
Chloride: 108 mEq/L (ref 96–112)
Creatinine, Ser: 1.18 mg/dL (ref 0.40–1.50)
GFR: 57.98 mL/min — ABNORMAL LOW (ref >60.00)
Glucose, Bld: 103 mg/dL — ABNORMAL HIGH (ref 70–99)
Potassium: 4 mEq/L (ref 3.5–5.1)
Sodium: 141 mEq/L (ref 135–145)
Total Bilirubin: 0.7 mg/dL (ref 0.2–1.2)
Total Protein: 6.7 g/dL (ref 6.0–8.3)

## 2022-03-22 LAB — D-DIMER, QUANTITATIVE: D-Dimer, Quant: 0.69 mcg/mL FEU — ABNORMAL HIGH (ref <0.50)

## 2022-03-22 LAB — BRAIN NATRIURETIC PEPTIDE: Pro B Natriuretic peptide (BNP): 1464 pg/mL — ABNORMAL HIGH (ref 0.0–100.0)

## 2022-03-22 MED ORDER — AZITHROMYCIN 250 MG PO TABS: ORAL_TABLET | ORAL | 0 refills | Status: AC

## 2022-03-22 MED ORDER — PREDNISONE 10 MG PO TABS: ORAL_TABLET | ORAL | 0 refills | Status: DC

## 2022-03-22 NOTE — Progress Notes (Signed)
Subjective:     Patient ID: Brandon [name], male    DOB: [birth year], 82 y.o.   MRN: [id number]  Chief Complaint  Patient presents with   Cough    Complains of productive cough    Shortness of Breath    Complains of hard to breath    Cough Associated symptoms include shortness of breath.  Shortness of Breath   Patient is in today with chief complaint of shortness of breath. He reports that symptoms began 1 week ago. Has been using albuterol with minimal improvement in his symptoms.  Has DOE and also difficulty breathing when laying flat.  [place].  Denies LE edema.  Denies fever, body aches or nasal congestion. Took a home test for covid yesterday which he reports was negative.   Health Maintenance Due  Topic Date Due   Zoster Vaccines- Shingrix (1 of 2) Never done   COVID-19 Vaccine (6 - 2023-24 season) 11/13/2021    Past Medical History:  Diagnosis Date   Anemia    Asthma    Hx of childhood asthma, disappeared for a while, then resurfaced 6-7 years ago.    Cardiomyopathy    with a negative cardiac catheterization in the past. 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     Past Surgical History:  Procedure Laterality Date   None      Family History  Problem Relation Age of Onset   Cancer Father        multiple melanoma   Lupus Sister        died at 28   Diabetes Mellitus II Sister        died from covid-19   Asthma Daughter    Neuropathy Sister    Colon cancer Neg Hx    Esophageal cancer Neg Hx     Social History   Socioeconomic History   Marital status: Married    Spouse name: [name]   Number of children: 3   Years of education: Not on file   Highest education level: Not on file  Occupational History   Occupation: retired   Tobacco Use   Smoking status: Former   Smokeless tobacco: Never   Tobacco comments:    quit smoling in Jul 23, 1988. started when 18, 1 ppd  Vaping Use   Vaping Use: Never used  Substance and Sexual Activity   Alcohol use: Yes    Comment: occ   Drug use: Never   Sexual activity: Not on file  Other Topics Concern   Not on file  Social History Narrative   Grew up in [place], attended [employer]. First wife died of breast ca in 07/23/97. 3 children. Remarried- 8 years. 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    Pt signed designated party release granting access to [facility] to his wife [name]. Detailed message may be left on home or cell phone. Roselle Locus August 13, 2009 11:34 am.    Social Determinants of Health   Financial Resource Strain: Medium Risk (03/19/2021)   Overall Financial Resource Strain (CARDIA)    Difficulty of Paying Living Expenses: Somewhat hard  Food Insecurity: Not on file  Transportation Needs: No Transportation Needs (05/28/2021)  PRAPARE - Hydrologist (Medical): No    Lack of Transportation (Non-Medical): No  Physical Activity: Insufficiently Active (07/25/2020)   Exercise Vital Sign    Days of Exercise per Week: 5 days    Minutes of Exercise per Session: 10 min  Stress: No Stress Concern Present (05/28/2021)   [place]    Feeling of Stress : Not at all  Social Connections: [place] (05/28/2021)   Social Connection and Isolation Panel [NHANES]    Frequency of Communication with Friends and Family: More than three times a week    Frequency of Social Gatherings with Friends and Family: Three times a week    Attends Religious Services: More than 4 times per year    Active Member of Clubs or Organizations: Yes    Attends Archivist Meetings: More than 4 times per year    Marital Status: Married  Intimate Partner Violence:  Unknown (05/28/2021)   Humiliation, Afraid, Rape, and Kick questionnaire    Fear of Current or Ex-Partner: No    Emotionally Abused: No    Physically Abused: No    Sexually Abused: Not on file    Outpatient Medications Prior to Visit  Medication Sig Dispense Refill   albuterol (PROVENTIL) (2.5 MG/3ML) 0.083% nebulizer solution TAKE 3 MLS BY NEBULIZER EVERY 6 HOURS AS NEEDED FOR WHEEZING FOR SHORTNESS OF BREATH 150 mL 3   albuterol (VENTOLIN HFA) 108 (90 Base) MCG/ACT inhaler Inhale 1-2 puffs into the lungs every 6 (six) hours as needed for wheezing or shortness of breath. 8 g 0   amLODipine (NORVASC) 10 MG tablet Take 1 tablet (10 mg total) by mouth daily. 90 tablet 0   aspirin 81 MG tablet Take 81 mg by mouth daily.     b complex vitamins tablet Take 1 tablet by mouth daily.     fluticasone-salmeterol (ADVAIR) 500-50 MCG/ACT AEPB Inhale 1 puff into the lungs in the morning and at bedtime. 60 each 6   furosemide (LASIX) 40 MG tablet Take 1 tablet (40 mg total) by mouth daily as needed. 90 tablet 1   leflunomide (ARAVA) 20 MG tablet Take 20 mg by mouth daily.     metoprolol succinate (TOPROL-XL) 25 MG 24 hr tablet Take 1 tablet (25 mg total) by mouth daily. 90 tablet 1   montelukast (SINGULAIR) 10 MG tablet Take 1 tablet (10 mg total) by mouth at bedtime. 90 tablet 1   sacubitril-valsartan (ENTRESTO) 97-103 MG Take 1 tablet by mouth 2 (two) times daily. (For medication assistance program) 180 tablet 1   spironolactone (ALDACTONE) 25 MG tablet Take 1 tablet (25 mg total) by mouth daily. 90 tablet 1   tamsulosin (FLOMAX) 0.4 MG CAPS capsule Take 1 capsule (0.4 mg total) by mouth daily. 90 capsule 1   No facility-administered medications prior to visit.    Allergies  Allergen Reactions   Ace Inhibitors Cough   Isosorb Dinitrate-Hydralazine Other (See Comments)    REACTION: dizziness/hypotensive    Review of Systems  Respiratory:  Positive for cough and shortness of breath.    See  HPI    Objective:    Physical Exam Constitutional:      General: He is not in acute distress.    Appearance: He is well-developed.  HENT:     Head: Normocephalic and atraumatic.  Cardiovascular:     Rate and Rhythm: Normal rate and regular rhythm.  Heart sounds: No murmur heard. Pulmonary:     Effort: Pulmonary effort is normal. No respiratory distress.     Breath sounds: Examination of the right-upper field reveals wheezing. Examination of the left-upper field reveals wheezing. Wheezing (expiratory L>R) present. No rales.  Musculoskeletal:     Right lower leg: 2+ Edema present.     Left lower leg: 2+ Edema present.  Skin:    General: Skin is warm and dry.  Neurological:     Mental Status: He is alert and oriented to person, place, and time.  Psychiatric:        Behavior: Behavior normal.        Thought Content: Thought content normal.     BP 126/83 (BP Location: Right Arm, Patient Position: Sitting, Cuff Size: Large)   Pulse 81   Temp 98.7 F (37.1 C) (Oral)   Resp 16   Wt 230 lb (104.3 kg)   SpO2 100%   BMI 32.08 kg/m  Wt Readings from Last 3 Encounters:  03/22/22 230 lb (104.3 kg)  11/06/21 235 lb 12.8 oz (107 kg)  11/03/21 237 lb (107.5 kg)       Assessment & Plan:   Problem List Items Addressed This Visit       Unprioritized   SOB (shortness of breath) - Primary    I suspect that this is an acute asthma exacerbation based on exam. Will rx with prednisone taper. Continue advair and prn albuterol. Treat empirically with azithromycin. Obtain cxr to rule out CHF exacerbation and pneumonia.  Labs as ordered.  The fact that symptoms began shortly [place] drive [place] also warrants rule out of PE.  Will obtain d dimer.  If positive would need V/Q scan.   EKG is obtained and personally reviewed- notes NSR with RBBB (pre-existing), some ectopy, no acute changes. Normal rate.       Relevant Medications   predniSONE (DELTASONE) 10 MG tablet   azithromycin  (ZITHROMAX) 250 MG tablet   Other Relevant Orders   EKG 12-Lead (Completed)   Comp Met (CMET)   CBC w/Diff   B Nat Peptide   D-Dimer, Quantitative   DG Chest 2 View    I am having Joandy E. [name] start on predniSONE and azithromycin. I am also having him maintain his aspirin, b complex vitamins, leflunomide, furosemide, tamsulosin, spironolactone, montelukast, albuterol, sacubitril-valsartan, fluticasone-salmeterol, metoprolol succinate, amLODipine, and albuterol.  Meds ordered this encounter  Medications   predniSONE (DELTASONE) 10 MG tablet    Sig: 4 tabs by mouth once daily for 2 days, then 3 tabs daily x 2 days, then 2 tabs daily x 2 days, then 1 tab daily x 2 days    Dispense:  20 tablet    Refill:  0    Order Specific Question:   Supervising Provider    Answer:   Danise Edge A [[id number]]   azithromycin (ZITHROMAX) 250 MG tablet    Sig: Take 2 tablets on day 1, then 1 tablet daily on days 2 through 5    Dispense:  6 tablet    Refill:  0    Order Specific Question:   Supervising Provider    Answer:   Danise Edge A [[id number]]

## 2022-03-22 NOTE — Assessment & Plan Note (Addendum)
I suspect that this is an acute asthma exacerbation based on exam. Will rx with prednisone taper. Continue advair and prn albuterol. Treat empirically with azithromycin. Obtain cxr to rule out CHF exacerbation and pneumonia.  Labs as ordered.  The fact that symptoms began shortly after his drive to beach also warrants rule out of PE.  Will obtain d dimer.  If positive would need V/Q scan.   EKG is obtained and personally reviewed- notes NSR with RBBB (pre-existing), some ectopy, no acute changes. Normal rate.

## 2022-03-22 NOTE — Telephone Encounter (Signed)
Reviewed lab work.  Mildly positive D dimer, very elevated BNP, CHF noted on CXR.  I suspect asthma/CHF exacerbation is most likely cause for symptoms.  Will need to get CTA chest to rule out PE, however I suspect it is most likely elevated due to RA, so I think it is reasonable to do first in the AM rather than having him go to the ER.   Attempted to reach patient- unable to leave voicemail, no answer, no mychart.

## 2022-03-23 NOTE — Telephone Encounter (Signed)
Pt returned my call yesterday evening. Advised pt of results.  He has not been taking lasix. Advised him to begin lasix 40mg  once daily for 3 days and then we will decide moving forward what his dosing will be. Will work on getting CTA early 1/9 to rule out PE.

## 2022-03-24 ENCOUNTER — Ambulatory Visit (HOSPITAL_BASED_OUTPATIENT_CLINIC_OR_DEPARTMENT_OTHER): Admission: RE | Admit: 2022-03-24 | Payer: Medicare Other | Source: Ambulatory Visit

## 2022-03-24 ENCOUNTER — Telehealth: Payer: Self-pay | Admitting: Family

## 2022-03-24 ENCOUNTER — Telehealth (HOSPITAL_BASED_OUTPATIENT_CLINIC_OR_DEPARTMENT_OTHER): Payer: Self-pay
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
## 2022-03-25 ENCOUNTER — Encounter (HOSPITAL_BASED_OUTPATIENT_CLINIC_OR_DEPARTMENT_OTHER): Payer: Self-pay

## 2022-03-25 ENCOUNTER — Ambulatory Visit (HOSPITAL_BASED_OUTPATIENT_CLINIC_OR_DEPARTMENT_OTHER)
Admission: RE | Admit: 2022-03-25 | Discharge: 2022-03-25 | Disposition: A | Discharge status: Home / Self Care | Payer: Medicare Other | Source: Ambulatory Visit | Attending: Family | Admitting: Family

## 2022-03-25 DIAGNOSIS — R0602 Shortness of breath: Secondary | ICD-10-CM | POA: Diagnosis not present

## 2022-03-25 DIAGNOSIS — R059 Cough, unspecified: Secondary | ICD-10-CM | POA: Diagnosis not present

## 2022-03-25 DIAGNOSIS — J90 Pleural effusion, not elsewhere classified: Secondary | ICD-10-CM | POA: Diagnosis not present

## 2022-03-25 DIAGNOSIS — R7989 Other specified abnormal findings of blood chemistry: Secondary | ICD-10-CM

## 2022-03-25 DIAGNOSIS — R062 Wheezing: Secondary | ICD-10-CM | POA: Diagnosis not present

## 2022-03-25 MED ORDER — IOHEXOL 350 MG/ML SOLN
100.0000 mL | Freq: Once | INTRAVENOUS | Status: AC | PRN
  Administered 2022-03-25: 100 mL via INTRAVENOUS

## 2022-03-26 ENCOUNTER — Telehealth: Payer: Self-pay | Admitting: Family

## 2022-03-26 NOTE — Telephone Encounter (Signed)
Please advise pt that CT is negative for blood clot in the lungs. I would like to see him for 1 week follow up please.

## 2022-03-26 NOTE — Telephone Encounter (Signed)
Patient advised of results and scheduled to come in for follow up 03/30/2022

## 2022-03-30 ENCOUNTER — Ambulatory Visit: Payer: Medicare Other | Admitting: Family

## 2022-03-30 ENCOUNTER — Ambulatory Visit: Payer: Medicare Other

## 2022-03-30 VITALS — BP 124/71 | HR 61 | Temp 98.4°F | Resp 16 | Wt 236.0 lb

## 2022-03-30 DIAGNOSIS — J45909 Unspecified asthma, uncomplicated: Secondary | ICD-10-CM | POA: Diagnosis not present

## 2022-03-30 DIAGNOSIS — R195 Other fecal abnormalities: Secondary | ICD-10-CM | POA: Diagnosis not present

## 2022-03-30 DIAGNOSIS — I2510 Atherosclerotic heart disease of native coronary artery without angina pectoris: Secondary | ICD-10-CM | POA: Diagnosis not present

## 2022-03-30 DIAGNOSIS — I5022 Chronic systolic (congestive) heart failure: Secondary | ICD-10-CM | POA: Diagnosis not present

## 2022-03-30 DIAGNOSIS — I2584 Coronary atherosclerosis due to calcified coronary lesion: Secondary | ICD-10-CM

## 2022-03-30 DIAGNOSIS — E538 Deficiency of other specified B group vitamins: Secondary | ICD-10-CM

## 2022-03-30 DIAGNOSIS — I10 Essential (primary) hypertension: Secondary | ICD-10-CM

## 2022-03-30 MED ORDER — CYANOCOBALAMIN 1000 MCG/ML IJ SOLN
1000.0000 ug | Freq: Once | INTRAMUSCULAR | Status: AC
  Administered 2022-03-30: 1000 ug via INTRAMUSCULAR

## 2022-03-30 MED ORDER — FUROSEMIDE 20 MG PO TABS: 20.0000 mg | ORAL_TABLET | Freq: Every day | ORAL | 3 refills | Status: DC

## 2022-03-30 NOTE — Assessment & Plan Note (Signed)
BP stable.  Continue amlodipine and Entresto per cardiology.

## 2022-03-30 NOTE — Assessment & Plan Note (Addendum)
Much improved following treatment with prednisone. He is concerned about possible cardiac side effects of singulair.  Given his recent asthma exacerbation I think benefit of continuing singulair is > risk.  Continue singulair, albuterol and advair.

## 2022-03-30 NOTE — Assessment & Plan Note (Signed)
Wt Readings from Last 3 Encounters:  03/30/22 236 lb (107 kg)  03/22/22 230 lb (104.3 kg)  11/06/21 235 lb 12.8 oz (107 kg)   Appears euvolemic today.  Continue lasix 20mg  once daily.  Obtain follow up bmet.

## 2022-03-30 NOTE — Assessment & Plan Note (Signed)
He never followed through with GI referral. I gave him number for Dr. Lyndel Safe to call to schedule.

## 2022-03-30 NOTE — Assessment & Plan Note (Signed)
Incidental finding on CT Angio. I sent this information to his cardiologist. He is scheduled for follow up with cardiology in February.

## 2022-03-30 NOTE — Progress Notes (Signed)
Subjective:   By signing my name below, I, Brandon [name], attest that this documentation has been prepared under the direction and in the presence of Brandon Fillers, NP 03/30/22   Patient ID: Brandon [name], male    DOB: [birth year], 82 y.o.   MRN: [id number]  Chief Complaint  Patient presents with   Cough    Here for follow up, "doing much better"   Shortness of Breath    "Doing much better"    HPI Patient is in today for 1 week follow up.  Shortness of breath: Patient was seen by me on 03/22/22 for shortness of breath, wheezing, DOE, orthopnea, and cough. He had a positive d-dimer of 0.69 and chest XR which revealed mild CHF at that time. His BNP was 1,464. Patient was started on Azithromycin and Prednisone taper at our visit. CT Angio was negative for PE. Today, he states that he is doing much better. He states that he has been taking Prednisone 20mg  daily and had significant relief after his first dose of Prednisone. He denies any leg swelling. His wheezing has resolved. Wt Readings from Last 5 Encounters:  03/30/22 236 lb (107 kg)  03/22/22 230 lb (104.3 kg)  11/06/21 235 lb 12.8 oz (107 kg)  11/03/21 237 lb (107.5 kg)  09/08/21 233 lb 6.4 oz (105.9 kg)    Cardiomyopathy: He wonders if he should stop using Singulair as he read that this can cause complications with his cardiomyopathy. He has been compliant with Entresto 97-103mg  BID.  Coronary artery calcification: CT chest from 03/25/22 showed coronary artery calcifications. We discussed this finding today in addition to his other studies noted above.  Hypertension: He has been compliant with Norvasc 10mg  daily, Toprol XL 25mg  daily, and Aldactone 25mg  daily. BP Readings from Last 5 Encounters:  03/30/22 124/71  03/22/22 126/83  11/06/21 122/78  11/03/21 (!) 130/59  09/08/21 124/76    B12 deficiency: Lab Results  Component Value Date   VITAMINB12 >1504 (H) 04/15/2021     Past Medical History:  Diagnosis Date    Anemia    Asthma    Hx of childhood asthma, disappeared for a while, then resurfaced 6-7 years ago.    Cardiomyopathy    with a negative cardiac catheterization in the past. 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     Past Surgical History:  Procedure Laterality Date   None      Family History  Problem Relation Age of Onset   Cancer Father        multiple melanoma   Lupus Sister        died at 23   Diabetes Mellitus II Sister        died from covid-19   Asthma Daughter    Neuropathy Sister    Colon cancer Neg Hx    Esophageal cancer Neg Hx     Social History   Socioeconomic History   Marital status: Married    Spouse name: Brandon [name]   Number of children: 3   Years of education: Not on file   Highest education level: Not on file  Occupational History   Occupation: retired  Tobacco Use   Smoking status: Former   Smokeless tobacco: Never   Tobacco comments:  quit smoling in Jul 17, 1988. started when 18, 1 ppd  Vaping Use   Vaping Use: Never used  Substance and Sexual Activity   Alcohol use: Yes    Comment: occ   Drug use: Never   Sexual activity: Not on file  Other Topics Concern   Not on file  Social History Narrative   Grew up in [place], attended [employer]. First wife died of breast ca in Jul 17, 1997. 3 children. Remarried- 8 years. 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    Pt signed designated party release granting access to [facility] to his wife [name]. Detailed message may be left on home or cell phone. Roselle Locus August 13, 2009 11:34 am.    Social Determinants of Health   Financial Resource Strain: Medium Risk (03/19/2021)   Overall Financial Resource Strain (CARDIA)    Difficulty of Paying Living Expenses: Somewhat hard  Food Insecurity: Not on file   Transportation Needs: No Transportation Needs (05/28/2021)   PRAPARE - Administrator, Civil Service (Medical): No    Lack of Transportation (Non-Medical): No  Physical Activity: Insufficiently Active (07/25/2020)   Exercise Vital Sign    Days of Exercise per Week: 5 days    Minutes of Exercise per Session: 10 min  Stress: No Stress Concern Present (05/28/2021)   [employer] of Occupational Health - Occupational Stress Questionnaire    Feeling of Stress : Not at all  Social Connections: Socially Integrated (05/28/2021)   Social Connection and Isolation Panel [NHANES]    Frequency of Communication with Friends and Family: More than three times a week    Frequency of Social Gatherings with Friends and Family: Three times a week    Attends Religious Services: More than 4 times per year    Active Member of Clubs or Organizations: Yes    Attends Banker Meetings: More than 4 times per year    Marital Status: Married  Intimate Partner Violence: Unknown (05/28/2021)   Humiliation, Afraid, Rape, and Kick questionnaire    Fear of Current or Ex-Partner: No    Emotionally Abused: No    Physically Abused: No    Sexually Abused: Not on file    Outpatient Medications Prior to Visit  Medication Sig Dispense Refill   albuterol (PROVENTIL) (2.5 MG/3ML) 0.083% nebulizer solution TAKE 3 MLS BY NEBULIZER EVERY 6 HOURS AS NEEDED FOR WHEEZING FOR SHORTNESS OF BREATH 150 mL 3   albuterol (VENTOLIN HFA) 108 (90 Base) MCG/ACT inhaler Inhale 1-2 puffs into the lungs every 6 (six) hours as needed for wheezing or shortness of breath. 8 g 0   amLODipine (NORVASC) 10 MG tablet Take 1 tablet (10 mg total) by mouth daily. 90 tablet 0   aspirin 81 MG tablet Take 81 mg by mouth daily.     b complex vitamins tablet Take 1 tablet by mouth daily.     fluticasone-salmeterol (ADVAIR) 500-50 MCG/ACT AEPB Inhale 1 puff into the lungs in the morning and at bedtime. 60 each 6   leflunomide (ARAVA)  20 MG tablet Take 20 mg by mouth daily.     metoprolol succinate (TOPROL-XL) 25 MG 24 hr tablet Take 1 tablet (25 mg total) by mouth daily. 90 tablet 1   montelukast (SINGULAIR) 10 MG tablet Take 1 tablet (10 mg total) by mouth at bedtime. 90 tablet 1   predniSONE (DELTASONE) 10 MG tablet 4 tabs by mouth once daily for 2 days, then 3 tabs daily x  2 days, then 2 tabs daily x 2 days, then 1 tab daily x 2 days 20 tablet 0   sacubitril-valsartan (ENTRESTO) 97-103 MG Take 1 tablet by mouth 2 (two) times daily. (For medication assistance program) 180 tablet 1   spironolactone (ALDACTONE) 25 MG tablet Take 1 tablet (25 mg total) by mouth daily. 90 tablet 1   tamsulosin (FLOMAX) 0.4 MG CAPS capsule Take 1 capsule (0.4 mg total) by mouth daily. 90 capsule 1   furosemide (LASIX) 40 MG tablet Take 1 tablet (40 mg total) by mouth daily as needed. 90 tablet 1   No facility-administered medications prior to visit.    Allergies  Allergen Reactions   Ace Inhibitors Cough   Isosorb Dinitrate-Hydralazine Other (See Comments)    REACTION: dizziness/hypotensive    Review of Systems  Constitutional:  Negative for fever.  HENT:  Negative for congestion, sinus pain and sore throat.   Respiratory:  Negative for cough, shortness of breath and wheezing.   Cardiovascular:  Negative for chest pain and palpitations.  Gastrointestinal:  Negative for blood in stool, constipation, diarrhea, nausea and vomiting.  Genitourinary:  Negative for dysuria, frequency and hematuria.  Musculoskeletal:  Negative for joint pain and myalgias.       Objective:    Physical Exam Constitutional:      General: He is not in acute distress.    Appearance: Normal appearance. He is not ill-appearing.  HENT:     Head: Normocephalic and atraumatic.     Right Ear: External ear normal.     Left Ear: External ear normal.  Eyes:     Extraocular Movements: Extraocular movements intact.     Pupils: Pupils are equal, round, and reactive  to light.  Cardiovascular:     Rate and Rhythm: Normal rate and regular rhythm.     Heart sounds: Normal heart sounds. No murmur heard.    No gallop.  Pulmonary:     Effort: Pulmonary effort is normal. No respiratory distress.     Breath sounds: Normal breath sounds. No wheezing or rales.  Skin:    General: Skin is warm and dry.  Neurological:     Mental Status: He is alert and oriented to person, place, and time.  Psychiatric:        Judgment: Judgment normal.     BP 124/71 (BP Location: Right Arm, Patient Position: Sitting, Cuff Size: Large)   Pulse 61   Temp 98.4 F (36.9 C) (Oral)   Resp 16   Wt 236 lb (107 kg)   SpO2 100%   BMI 32.92 kg/m  Wt Readings from Last 3 Encounters:  03/30/22 236 lb (107 kg)  03/22/22 230 lb (104.3 kg)  11/06/21 235 lb 12.8 oz (107 kg)       Assessment & Plan:  Essential hypertension Assessment & Plan: BP stable.  Continue amlodipine and Entresto per cardiology.  Orders: -     Basic metabolic panel  CHRONIC SYSTOLIC HEART FAILURE Assessment & Plan: Wt Readings from Last 3 Encounters:  03/30/22 236 lb (107 kg)  03/22/22 230 lb (104.3 kg)  11/06/21 235 lb 12.8 oz (107 kg)   Appears euvolemic today.  Continue lasix 20mg  once daily.  Obtain follow up bmet.    Uncomplicated asthma, unspecified asthma severity, unspecified whether persistent Assessment & Plan: Much improved following treatment with prednisone. He is concerned about possible cardiac side effects of singulair.  Given his recent asthma exacerbation I think benefit of continuing singulair is > risk.  Continue singulair, albuterol and advair.    B12 deficiency Assessment & Plan: B12 shot today. Continue monthly.   Orders: -     Cyanocobalamin  Coronary artery calcification Assessment & Plan: Incidental finding on CT Angio. I sent this information to his cardiologist. He is scheduled for follow up with cardiology in February.     Heme positive stool Assessment &  Plan: He never followed through with GI referral. I gave him number for Dr. Lyndel Safe to call to schedule.   Orders: -     Ambulatory referral to Gastroenterology  Other orders -     Furosemide; Take 1 tablet (20 mg total) by mouth daily.  Dispense: 30 tablet; Refill: 3     I,Alexis Herring,acting as a scribe for Nance Pear, NP.,have documented all relevant documentation on the behalf of Nance Pear, NP,as directed by  Nance Pear, NP while in the presence of Nance Pear, NP.   I, Nance Pear, NP, personally preformed the services described in this documentation.  All medical record entries made by the scribe were at my direction and in my presence.  I have reviewed the chart and discharge instructions (if applicable) and agree that the record reflects my personal performance and is accurate and complete. 03/30/22   Nance Pear, NP

## 2022-03-30 NOTE — Assessment & Plan Note (Signed)
B12 shot today  Continue monthly 

## 2022-03-31 DIAGNOSIS — M1612 Unilateral primary osteoarthritis, left hip: Secondary | ICD-10-CM | POA: Diagnosis not present

## 2022-03-31 LAB — BASIC METABOLIC PANEL
BUN: 28 mg/dL — ABNORMAL HIGH (ref 6–23)
CO2: 26 mEq/L (ref 19–32)
Calcium: 8.7 mg/dL (ref 8.4–10.5)
Chloride: 106 mEq/L (ref 96–112)
Creatinine, Ser: 1.37 mg/dL (ref 0.40–1.50)
GFR: 48.47 mL/min — ABNORMAL LOW (ref >60.00)
Glucose, Bld: 70 mg/dL (ref 70–99)
Potassium: 4.1 mEq/L (ref 3.5–5.1)
Sodium: 139 mEq/L (ref 135–145)

## 2022-04-02 DIAGNOSIS — G4733 Obstructive sleep apnea (adult) (pediatric): Secondary | ICD-10-CM | POA: Diagnosis not present

## 2022-04-15 ENCOUNTER — Other Ambulatory Visit: Payer: Self-pay | Admitting: Family

## 2022-04-16 DIAGNOSIS — I639 Cerebral infarction, unspecified: Secondary | ICD-10-CM

## 2022-04-16 HISTORY — DX: Cerebral infarction, unspecified: I63.9

## 2022-04-17 ENCOUNTER — Emergency Department (HOSPITAL_BASED_OUTPATIENT_CLINIC_OR_DEPARTMENT_OTHER): Payer: Medicare Other

## 2022-04-17 ENCOUNTER — Inpatient Hospital Stay (HOSPITAL_BASED_OUTPATIENT_CLINIC_OR_DEPARTMENT_OTHER)
Admission: EM | Admit: 2022-04-17 | Discharge: 2022-04-19 | DRG: 065 | Disposition: A | Discharge status: Home / Self Care | Payer: Medicare Other | Attending: Internal Medicine | Admitting: Internal Medicine

## 2022-04-17 ENCOUNTER — Encounter (HOSPITAL_BASED_OUTPATIENT_CLINIC_OR_DEPARTMENT_OTHER): Payer: Self-pay

## 2022-04-17 ENCOUNTER — Other Ambulatory Visit: Payer: Self-pay

## 2022-04-17 DIAGNOSIS — I6389 Other cerebral infarction: Principal | ICD-10-CM | POA: Diagnosis present

## 2022-04-17 DIAGNOSIS — I493 Ventricular premature depolarization: Secondary | ICD-10-CM | POA: Diagnosis not present

## 2022-04-17 DIAGNOSIS — I452 Bifascicular block: Secondary | ICD-10-CM | POA: Diagnosis present

## 2022-04-17 DIAGNOSIS — I639 Cerebral infarction, unspecified: Secondary | ICD-10-CM | POA: Diagnosis not present

## 2022-04-17 DIAGNOSIS — Z79899 Other long term (current) drug therapy: Secondary | ICD-10-CM | POA: Diagnosis not present

## 2022-04-17 DIAGNOSIS — Z8673 Personal history of transient ischemic attack (TIA), and cerebral infarction without residual deficits: Secondary | ICD-10-CM | POA: Diagnosis present

## 2022-04-17 DIAGNOSIS — J45909 Unspecified asthma, uncomplicated: Secondary | ICD-10-CM | POA: Diagnosis not present

## 2022-04-17 DIAGNOSIS — G9389 Other specified disorders of brain: Secondary | ICD-10-CM | POA: Diagnosis not present

## 2022-04-17 DIAGNOSIS — I700 Atherosclerosis of aorta: Secondary | ICD-10-CM | POA: Diagnosis not present

## 2022-04-17 DIAGNOSIS — I5022 Chronic systolic (congestive) heart failure: Secondary | ICD-10-CM | POA: Diagnosis not present

## 2022-04-17 DIAGNOSIS — N400 Enlarged prostate without lower urinary tract symptoms: Secondary | ICD-10-CM | POA: Diagnosis present

## 2022-04-17 DIAGNOSIS — I10 Essential (primary) hypertension: Secondary | ICD-10-CM | POA: Diagnosis present

## 2022-04-17 DIAGNOSIS — Z1152 Encounter for screening for COVID-19: Secondary | ICD-10-CM | POA: Diagnosis not present

## 2022-04-17 DIAGNOSIS — Z87891 Personal history of nicotine dependence: Secondary | ICD-10-CM

## 2022-04-17 DIAGNOSIS — Z7982 Long term (current) use of aspirin: Secondary | ICD-10-CM | POA: Diagnosis not present

## 2022-04-17 DIAGNOSIS — N200 Calculus of kidney: Secondary | ICD-10-CM | POA: Diagnosis not present

## 2022-04-17 DIAGNOSIS — K5730 Diverticulosis of large intestine without perforation or abscess without bleeding: Secondary | ICD-10-CM | POA: Diagnosis not present

## 2022-04-17 DIAGNOSIS — Z888 Allergy status to other drugs, medicaments and biological substances status: Secondary | ICD-10-CM

## 2022-04-17 DIAGNOSIS — G4733 Obstructive sleep apnea (adult) (pediatric): Secondary | ICD-10-CM | POA: Diagnosis present

## 2022-04-17 DIAGNOSIS — Z808 Family history of malignant neoplasm of other organs or systems: Secondary | ICD-10-CM | POA: Diagnosis not present

## 2022-04-17 DIAGNOSIS — Z833 Family history of diabetes mellitus: Secondary | ICD-10-CM

## 2022-04-17 DIAGNOSIS — I2510 Atherosclerotic heart disease of native coronary artery without angina pectoris: Secondary | ICD-10-CM | POA: Diagnosis present

## 2022-04-17 DIAGNOSIS — R531 Weakness: Secondary | ICD-10-CM | POA: Diagnosis not present

## 2022-04-17 DIAGNOSIS — Z6837 Body mass index (BMI) 37.0-37.9, adult: Secondary | ICD-10-CM | POA: Diagnosis not present

## 2022-04-17 DIAGNOSIS — R2689 Other abnormalities of gait and mobility: Secondary | ICD-10-CM | POA: Diagnosis present

## 2022-04-17 DIAGNOSIS — Z7951 Long term (current) use of inhaled steroids: Secondary | ICD-10-CM | POA: Diagnosis not present

## 2022-04-17 DIAGNOSIS — N1831 Chronic kidney disease, stage 3a: Secondary | ICD-10-CM | POA: Diagnosis not present

## 2022-04-17 DIAGNOSIS — R42 Dizziness and giddiness: Secondary | ICD-10-CM

## 2022-04-17 DIAGNOSIS — D649 Anemia, unspecified: Secondary | ICD-10-CM | POA: Diagnosis not present

## 2022-04-17 DIAGNOSIS — Z832 Family history of diseases of the blood and blood-forming organs and certain disorders involving the immune mechanism: Secondary | ICD-10-CM

## 2022-04-17 DIAGNOSIS — R29810 Facial weakness: Secondary | ICD-10-CM | POA: Diagnosis not present

## 2022-04-17 DIAGNOSIS — N3289 Other specified disorders of bladder: Secondary | ICD-10-CM | POA: Diagnosis not present

## 2022-04-17 DIAGNOSIS — I130 Hypertensive heart and chronic kidney disease with heart failure and stage 1 through stage 4 chronic kidney disease, or unspecified chronic kidney disease: Secondary | ICD-10-CM | POA: Diagnosis present

## 2022-04-17 DIAGNOSIS — R29818 Other symptoms and signs involving the nervous system: Secondary | ICD-10-CM | POA: Diagnosis not present

## 2022-04-17 DIAGNOSIS — N401 Enlarged prostate with lower urinary tract symptoms: Secondary | ICD-10-CM | POA: Diagnosis not present

## 2022-04-17 DIAGNOSIS — R29701 NIHSS score 1: Secondary | ICD-10-CM | POA: Diagnosis not present

## 2022-04-17 DIAGNOSIS — E785 Hyperlipidemia, unspecified: Secondary | ICD-10-CM | POA: Diagnosis present

## 2022-04-17 DIAGNOSIS — N281 Cyst of kidney, acquired: Secondary | ICD-10-CM | POA: Diagnosis not present

## 2022-04-17 DIAGNOSIS — I255 Ischemic cardiomyopathy: Secondary | ICD-10-CM | POA: Diagnosis present

## 2022-04-17 DIAGNOSIS — I6523 Occlusion and stenosis of bilateral carotid arteries: Secondary | ICD-10-CM | POA: Diagnosis not present

## 2022-04-17 DIAGNOSIS — M069 Rheumatoid arthritis, unspecified: Secondary | ICD-10-CM | POA: Diagnosis present

## 2022-04-17 DIAGNOSIS — E669 Obesity, unspecified: Secondary | ICD-10-CM | POA: Diagnosis present

## 2022-04-17 DIAGNOSIS — Z6831 Body mass index (BMI) 31.0-31.9, adult: Secondary | ICD-10-CM

## 2022-04-17 DIAGNOSIS — I63039 Cerebral infarction due to thrombosis of unspecified carotid artery: Principal | ICD-10-CM

## 2022-04-17 DIAGNOSIS — Z825 Family history of asthma and other chronic lower respiratory diseases: Secondary | ICD-10-CM

## 2022-04-17 LAB — RESP PANEL BY RT-PCR (RSV, FLU A&B, COVID)  RVPGX2
Influenza A by PCR: NEGATIVE
Influenza B by PCR: NEGATIVE
Resp Syncytial Virus by PCR: NEGATIVE
SARS Coronavirus 2 by RT PCR: NEGATIVE

## 2022-04-17 LAB — URINALYSIS, MICROSCOPIC (REFLEX)

## 2022-04-17 LAB — CBC
HCT: 39.3 % (ref 39.0–52.0)
Hemoglobin: 12.7 g/dL — ABNORMAL LOW (ref 13.0–17.0)
MCH: 31.3 pg (ref 26.0–34.0)
MCHC: 32.3 g/dL (ref 30.0–36.0)
MCV: 96.8 fL (ref 80.0–100.0)
Platelets: 209 10*3/uL (ref 150–400)
RBC: 4.06 MIL/uL — ABNORMAL LOW (ref 4.22–5.81)
RDW: 13.3 % (ref 11.5–15.5)
WBC: 5.7 10*3/uL (ref 4.0–10.5)
nRBC: 0 % (ref 0.0–0.2)

## 2022-04-17 LAB — BASIC METABOLIC PANEL
Anion gap: 5 (ref 5–15)
BUN: 13 mg/dL (ref 8–23)
CO2: 25 mmol/L (ref 22–32)
Calcium: 9.1 mg/dL (ref 8.9–10.3)
Chloride: 106 mmol/L (ref 98–111)
Creatinine, Ser: 1.28 mg/dL — ABNORMAL HIGH (ref 0.61–1.24)
GFR, Estimated: 56 mL/min — ABNORMAL LOW (ref >60)
Glucose, Bld: 98 mg/dL (ref 70–99)
Potassium: 4 mmol/L (ref 3.5–5.1)
Sodium: 136 mmol/L (ref 135–145)

## 2022-04-17 LAB — URINALYSIS, ROUTINE W REFLEX MICROSCOPIC
Glucose, UA: NEGATIVE mg/dL
Hgb urine dipstick: NEGATIVE
Ketones, ur: NEGATIVE mg/dL
Leukocytes,Ua: NEGATIVE
Nitrite: NEGATIVE
Protein, ur: 30 mg/dL — AB
Specific Gravity, Urine: 1.025 (ref 1.005–1.030)
pH: 5.5 (ref 5.0–8.0)

## 2022-04-17 LAB — CBG MONITORING, ED: Glucose-Capillary: 84 mg/dL (ref 70–99)

## 2022-04-17 MED ORDER — LORAZEPAM 2 MG/ML IJ SOLN
1.0000 mg | Freq: Once | INTRAMUSCULAR | Status: AC | PRN
  Administered 2022-04-18: 1 mg via INTRAVENOUS
  Filled 2022-04-17: qty 1

## 2022-04-17 NOTE — ED Triage Notes (Signed)
Pt reports feeling weak, lack of energy and has been feeling dizzy, onset 3-4 days ago associated with mild nausea. He denies syncope, chest pain, emesis or diarrhea.

## 2022-04-17 NOTE — ED Notes (Signed)
This RN attempted to call report to charge RN at [facility]. No answer. Will try again shortly.

## 2022-04-17 NOTE — ED Provider Notes (Addendum)
[facility] Provider Note   CSN: [id number] Arrival date & time: 04/17/22  1941     History  Chief Complaint  Patient presents with   Dizziness    Brandon [name] is a 82 y.o. male.  Patient has not been feeling well since about Tuesday.  Patient with lightheadedness, no room spinning, no vertigo, fatigue weakness no real cough or congestion no headache no chest pain no abdominal pain no nausea vomiting or diarrhea.  Correction patient stated he did have some mild nausea but no abdominal pain.  Had fatigue.  Patient's past medical history sniffing for hypertension and chronic systolic heart failure.  Patient was on Lasix states he just takes it now as he needs to.  History of cardiomyopathy hypertension according to his past medical history patient quit smoking in 1990.  Also patient states he did not take his blood pressure medicine today because he was not feeling well.       Home Medications Prior to Admission medications   Medication Sig Start Date End Date Taking? Authorizing Provider  albuterol (PROVENTIL) (2.5 MG/3ML) 0.083% nebulizer solution TAKE 3 MLS BY NEBULIZER EVERY 6 HOURS AS NEEDED FOR WHEEZING FOR SHORTNESS OF BREATH 02/23/22   Debbrah Alar, NP  albuterol (VENTOLIN HFA) 108 (90 Base) MCG/ACT inhaler Inhale 1-2 puffs into the lungs every 6 (six) hours as needed for wheezing or shortness of breath. 11/03/21   Debbrah Alar, NP  amLODipine (NORVASC) 10 MG tablet Take 1 tablet by mouth once daily 04/15/22   Debbrah Alar, NP  aspirin 81 MG tablet Take 81 mg by mouth daily.    [provider]  b complex vitamins tablet Take 1 tablet by mouth daily. 01/23/19   Debbrah Alar, NP  fluticasone-salmeterol (ADVAIR) 500-50 MCG/ACT AEPB Inhale 1 puff into the lungs in the morning and at bedtime. 12/18/21   Debbrah Alar, NP  furosemide (LASIX) 20 MG tablet Take 1 tablet (20 mg total) by mouth daily.  03/30/22   Debbrah Alar, NP  leflunomide (ARAVA) 20 MG tablet Take 20 mg by mouth daily. 05/31/19   [provider]  metoprolol succinate (TOPROL-XL) 25 MG 24 hr tablet Take 1 tablet (25 mg total) by mouth daily. 01/29/22   Debbrah Alar, NP  montelukast (SINGULAIR) 10 MG tablet Take 1 tablet (10 mg total) by mouth at bedtime. 11/03/21   Debbrah Alar, NP  predniSONE (DELTASONE) 10 MG tablet 4 tabs by mouth once daily for 2 days, then 3 tabs daily x 2 days, then 2 tabs daily x 2 days, then 1 tab daily x 2 days 03/22/22   Debbrah Alar, NP  sacubitril-valsartan (ENTRESTO) 97-103 MG Take 1 tablet by mouth 2 (two) times daily. (For medication assistance program) 11/20/21   Minus Breeding, MD  spironolactone (ALDACTONE) 25 MG tablet Take 1 tablet (25 mg total) by mouth daily. 11/03/21   Debbrah Alar, NP  tamsulosin (FLOMAX) 0.4 MG CAPS capsule Take 1 capsule (0.4 mg total) by mouth daily. 11/03/21   Debbrah Alar, NP      Allergies    Ace inhibitors and Isosorb dinitrate-hydralazine    Review of Systems   Review of Systems  Constitutional:  Positive for fever. Negative for chills.  HENT:  Negative for ear pain and sore throat.   Eyes:  Negative for pain and visual disturbance.  Respiratory:  Negative for cough and shortness of breath.   Cardiovascular:  Negative for chest pain and palpitations.  Gastrointestinal:  Positive for nausea. Negative for abdominal pain and vomiting.  Genitourinary:  Negative for dysuria and hematuria.  Musculoskeletal:  Negative for arthralgias and back pain.  Skin:  Negative for color change and rash.  Neurological:  Positive for weakness and light-headedness. Negative for seizures and syncope.  All other systems reviewed and are negative.   Physical Exam Updated Vital Signs BP (!) 132/100   Pulse (!) 39   Temp 98.4 F (36.9 C) (Oral)   Resp 18   Ht 1.829 m (6')   Wt 105.2 kg   SpO2 94%   BMI 31.46 kg/m  Physical  Exam Vitals and nursing note reviewed.  Constitutional:      General: He is not in acute distress.    Appearance: Normal appearance. He is well-developed.  HENT:     Head: Normocephalic and atraumatic.  Eyes:     Conjunctiva/sclera: Conjunctivae normal.     Pupils: Pupils are equal, round, and reactive to light.  Cardiovascular:     Rate and Rhythm: Normal rate and regular rhythm.     Heart sounds: No murmur heard. Pulmonary:     Effort: Pulmonary effort is normal. No respiratory distress.     Breath sounds: Normal breath sounds.  Abdominal:     Palpations: Abdomen is soft.     Tenderness: There is no abdominal tenderness.  Musculoskeletal:        General: No swelling.     Cervical back: Normal range of motion and neck supple.     Right lower leg: No edema.     Left lower leg: No edema.  Skin:    General: Skin is warm and dry.     Capillary Refill: Capillary refill takes less than 2 seconds.  Neurological:     General: No focal deficit present.     Mental Status: He is alert and oriented to person, place, and time.     Cranial Nerves: No cranial nerve deficit.     Sensory: No sensory deficit.  Psychiatric:        Mood and Affect: Mood normal.     ED Results / Procedures / Treatments   Labs (all labs ordered are listed, but only abnormal results are displayed) Labs Reviewed  BASIC METABOLIC PANEL - Abnormal; Notable for the following components:      Result Value   Creatinine, Ser 1.28 (*)    GFR, Estimated 56 (*)    All other components within normal limits  CBC - Abnormal; Notable for the following components:   RBC 4.06 (*)    Hemoglobin 12.7 (*)    All other components within normal limits  URINALYSIS, ROUTINE W REFLEX MICROSCOPIC - Abnormal; Notable for the following components:   Bilirubin Urine SMALL (*)    Protein, ur 30 (*)    All other components within normal limits  URINALYSIS, MICROSCOPIC (REFLEX) - Abnormal; Notable for the following components:    Bacteria, UA FEW (*)    All other components within normal limits  RESP PANEL BY RT-PCR (RSV, FLU A&B, COVID)  [id number]  CBG MONITORING, ED    EKG EKG Interpretation  Date/Time:  Saturday April 17 2022 19:56:27 EST Ventricular Rate:  77 PR Interval:  181 QRS Duration: 148 QT Interval:  400 QTC Calculation: 453 R Axis:   255 Text Interpretation: Sinus rhythm Multiple premature complexes, vent & supraven RBBB and LAFB No previous ECGs available Confirmed by Fredia Sorrow [phone]) on 04/17/2022 7:59:40 PM  Radiology CT Head Wo Contrast  Addendum Date: 04/17/2022   ADDENDUM REPORT: 04/17/2022 21:03 ADDENDUM: Critical Value/emergent results were called by telephone at the time of interpretation on 04/17/2022 at 9:03 pm to provider Fredia Sorrow , who verbally acknowledged these results. Electronically Signed   By: Brett Fairy M.D.   On: 04/17/2022 21:03   Result Date: 04/17/2022 CLINICAL DATA:  Neuro deficit, acute, stroke suspected. Weakness, lack of injury, dizziness. EXAM: CT HEAD WITHOUT CONTRAST TECHNIQUE: Contiguous axial images were obtained from the base of the skull through the vertex without intravenous contrast. RADIATION DOSE REDUCTION: This exam was performed according to the departmental dose-optimization program which includes automated exposure control, adjustment of the mA and/or kV according to patient size and/or use of iterative reconstruction technique. COMPARISON:  None Available. FINDINGS: Brain: No acute intracranial hemorrhage, midline shift or mass effect. No extra-axial fluid collection. Scattered periventricular white matter hypodensities are present bilaterally. No hydrocephalus. Calcification is noted in the basal ganglia on the right. There is a vague hypodense region in the thalamus on the right which is indeterminate in age. Vascular: No hyperdense vessel or unexpected calcification. Skull: Normal. Negative for fracture or focal lesion. Sinuses/Orbits: Round  density is noted in the ethmoid air cells on the right, possible mucosal retention cyst versus polyp. No acute orbital abnormality. 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 IMPRESSION: 1. Vague hypodensity in the thalamus on the right, possible infarct of indeterminate age. MRI is recommended for further evaluation. 2. Chronic microvascular ischemic changes. Electronically Signed: By: Brett Fairy M.D. On: 04/17/2022 20:58   DG Chest Port 1 View  Result Date: 04/17/2022 CLINICAL DATA:  Weakness.  History of congestive heart failure. EXAM: PORTABLE CHEST 1 VIEW COMPARISON:  January 11 current right 4, chest CT FINDINGS: Tortuosity of the thoracic aorta. Cardiomediastinal silhouette is normal. Mediastinal contours appear intact. There is no evidence of focal airspace consolidation, pleural effusion or pneumothorax. Osseous structures are without acute abnormality. Soft tissues are grossly normal. IMPRESSION: No active disease. Electronically Signed   By: Fidela Salisbury M.D.   On: 04/17/2022 20:57    Procedures Procedures    Medications Ordered in ED Medications  LORazepam (ATIVAN) injection 1 mg (has no administration in time range)    ED Course/ Medical Decision Making/ A&P                             Medical Decision Making Amount and/or Complexity of Data Reviewed Labs: ordered. Radiology: ordered.  Risk Prescription drug management.   Respiratory panel pending.  Basic metabolic panel normal other than a creatinine of 1.28 for GFR 56.  CBC and urinalysis pending.  EKG without acute findings.  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  But patient does not have any chest pain.  Will get portable chest and CT head just to broaden things out.  As patient has had some dizziness but really more lightheadedness no true room spinning.  Based on the head CT findings  which raise some question of vague hypodensity in the thalamus on the right possible infarct of indeterminate age MRI is recommended for further evaluation.  Will discuss with neurohospitalist.  We did an extensive neuroexam no real focal deficits.  Able to ambulate okay in the room.  Family member says that maybe his gait has been off a little  bit for the past 4 days.  But no obvious focal finding.  Discussed with Dr. Iver Nestle neurologist at [facility] thinks that this could represent a subacute infarct.  Recommends patient be transferred in for MRI and then them contacted if it is abnormal.  Patient currently very stable.  Workup otherwise without any acute findings.  Respiratory panel negative urinalysis negative.  Basic metabolic panel negative.   Patient will be transferred in to [facility] ED for the MRI.  MRIs ordered also ordered 1 mg of Ativan IV as needed prior to the procedure he has had MRIs before and usually needs a little bit of sedation.  Excepting ED physician is Dr. Denton Lank  CRITICAL CARE Performed by: Vanetta Mulders Total critical care time: 35 minutes Critical care time was exclusive of separately billable procedures and treating other patients. Critical care was necessary to treat or prevent imminent or life-threatening deterioration. Critical care was time spent personally by me on the following activities: development of treatment plan with patient and/or surrogate as well as nursing, discussions with consultants, evaluation of patient's response to treatment, examination of patient, obtaining history from patient or surrogate, ordering and performing treatments and interventions, ordering and review of laboratory studies, ordering and review of radiographic studies, pulse oximetry and re-evaluation of patient's condition.     Final Clinical Impression(s) / ED Diagnoses Final diagnoses:  Lightheadedness  Cerebrovascular accident (CVA), unspecified mechanism [facility])    Rx / DC  Orders ED Discharge Orders     None         Vanetta Mulders, MD 04/17/22 0272    Vanetta Mulders, MD 04/17/22 2159

## 2022-04-17 NOTE — ED Notes (Signed)
Pt ambulatory to restroom with independent steady gait °

## 2022-04-18 ENCOUNTER — Observation Stay (HOSPITAL_COMMUNITY): Payer: Medicare Other

## 2022-04-18 ENCOUNTER — Other Ambulatory Visit: Payer: Self-pay

## 2022-04-18 ENCOUNTER — Emergency Department (HOSPITAL_COMMUNITY): Payer: Medicare Other

## 2022-04-18 DIAGNOSIS — E669 Obesity, unspecified: Secondary | ICD-10-CM

## 2022-04-18 DIAGNOSIS — Z79899 Other long term (current) drug therapy: Secondary | ICD-10-CM | POA: Diagnosis not present

## 2022-04-18 DIAGNOSIS — N200 Calculus of kidney: Secondary | ICD-10-CM | POA: Diagnosis not present

## 2022-04-18 DIAGNOSIS — Z7982 Long term (current) use of aspirin: Secondary | ICD-10-CM | POA: Diagnosis not present

## 2022-04-18 DIAGNOSIS — I452 Bifascicular block: Secondary | ICD-10-CM | POA: Diagnosis not present

## 2022-04-18 DIAGNOSIS — G4733 Obstructive sleep apnea (adult) (pediatric): Secondary | ICD-10-CM | POA: Diagnosis not present

## 2022-04-18 DIAGNOSIS — I10 Essential (primary) hypertension: Secondary | ICD-10-CM | POA: Diagnosis not present

## 2022-04-18 DIAGNOSIS — I255 Ischemic cardiomyopathy: Secondary | ICD-10-CM | POA: Diagnosis not present

## 2022-04-18 DIAGNOSIS — I130 Hypertensive heart and chronic kidney disease with heart failure and stage 1 through stage 4 chronic kidney disease, or unspecified chronic kidney disease: Secondary | ICD-10-CM | POA: Diagnosis not present

## 2022-04-18 DIAGNOSIS — Z6831 Body mass index (BMI) 31.0-31.9, adult: Secondary | ICD-10-CM | POA: Diagnosis not present

## 2022-04-18 DIAGNOSIS — R29701 NIHSS score 1: Secondary | ICD-10-CM | POA: Diagnosis not present

## 2022-04-18 DIAGNOSIS — R42 Dizziness and giddiness: Secondary | ICD-10-CM | POA: Diagnosis present

## 2022-04-18 DIAGNOSIS — N1831 Chronic kidney disease, stage 3a: Secondary | ICD-10-CM | POA: Diagnosis not present

## 2022-04-18 DIAGNOSIS — I6389 Other cerebral infarction: Secondary | ICD-10-CM | POA: Diagnosis not present

## 2022-04-18 DIAGNOSIS — N3289 Other specified disorders of bladder: Secondary | ICD-10-CM | POA: Diagnosis not present

## 2022-04-18 DIAGNOSIS — N281 Cyst of kidney, acquired: Secondary | ICD-10-CM | POA: Diagnosis not present

## 2022-04-18 DIAGNOSIS — I5022 Chronic systolic (congestive) heart failure: Secondary | ICD-10-CM

## 2022-04-18 DIAGNOSIS — J45909 Unspecified asthma, uncomplicated: Secondary | ICD-10-CM

## 2022-04-18 DIAGNOSIS — I493 Ventricular premature depolarization: Secondary | ICD-10-CM | POA: Diagnosis not present

## 2022-04-18 DIAGNOSIS — M069 Rheumatoid arthritis, unspecified: Secondary | ICD-10-CM | POA: Diagnosis not present

## 2022-04-18 DIAGNOSIS — N401 Enlarged prostate with lower urinary tract symptoms: Secondary | ICD-10-CM

## 2022-04-18 DIAGNOSIS — Z1152 Encounter for screening for COVID-19: Secondary | ICD-10-CM | POA: Diagnosis not present

## 2022-04-18 DIAGNOSIS — Z87891 Personal history of nicotine dependence: Secondary | ICD-10-CM | POA: Diagnosis not present

## 2022-04-18 DIAGNOSIS — Z808 Family history of malignant neoplasm of other organs or systems: Secondary | ICD-10-CM | POA: Diagnosis not present

## 2022-04-18 DIAGNOSIS — I700 Atherosclerosis of aorta: Secondary | ICD-10-CM | POA: Diagnosis not present

## 2022-04-18 DIAGNOSIS — K5730 Diverticulosis of large intestine without perforation or abscess without bleeding: Secondary | ICD-10-CM | POA: Diagnosis not present

## 2022-04-18 DIAGNOSIS — Z888 Allergy status to other drugs, medicaments and biological substances status: Secondary | ICD-10-CM | POA: Diagnosis not present

## 2022-04-18 DIAGNOSIS — D649 Anemia, unspecified: Secondary | ICD-10-CM | POA: Diagnosis present

## 2022-04-18 DIAGNOSIS — R29810 Facial weakness: Secondary | ICD-10-CM | POA: Diagnosis not present

## 2022-04-18 DIAGNOSIS — I639 Cerebral infarction, unspecified: Secondary | ICD-10-CM | POA: Diagnosis not present

## 2022-04-18 DIAGNOSIS — I2510 Atherosclerotic heart disease of native coronary artery without angina pectoris: Secondary | ICD-10-CM | POA: Diagnosis not present

## 2022-04-18 DIAGNOSIS — N400 Enlarged prostate without lower urinary tract symptoms: Secondary | ICD-10-CM | POA: Diagnosis not present

## 2022-04-18 DIAGNOSIS — Z7951 Long term (current) use of inhaled steroids: Secondary | ICD-10-CM | POA: Diagnosis not present

## 2022-04-18 DIAGNOSIS — Z8673 Personal history of transient ischemic attack (TIA), and cerebral infarction without residual deficits: Secondary | ICD-10-CM | POA: Diagnosis present

## 2022-04-18 DIAGNOSIS — Z6837 Body mass index (BMI) 37.0-37.9, adult: Secondary | ICD-10-CM

## 2022-04-18 LAB — ECHOCARDIOGRAM COMPLETE
Area-P 1/2: 1.95 cm2
Calc EF: 42.5 %
Height: 72 in
MV M vel: 2.11 m/s
MV Peak grad: 17.8 mmHg
S' Lateral: 5.8 cm
Single Plane A2C EF: 41 %
Single Plane A4C EF: 44.1 %
Weight: 3712 oz

## 2022-04-18 LAB — HEMOGLOBIN A1C
Hgb A1c MFr Bld: 5.1 % (ref 4.8–5.6)
Mean Plasma Glucose: 99.67 mg/dL

## 2022-04-18 LAB — LIPID PANEL
Cholesterol: 188 mg/dL (ref 0–200)
HDL: 51 mg/dL (ref >40)
LDL Cholesterol: 125 mg/dL — ABNORMAL HIGH (ref 0–99)
Total CHOL/HDL Ratio: 3.7 RATIO
Triglycerides: 59 mg/dL (ref <150)
VLDL: 12 mg/dL (ref 0–40)

## 2022-04-18 LAB — TSH: TSH: 0.862 u[IU]/mL (ref 0.350–4.500)

## 2022-04-18 MED ORDER — ROSUVASTATIN CALCIUM 5 MG PO TABS
10.0000 mg | ORAL_TABLET | Freq: Every day | ORAL | Status: DC
  Administered 2022-04-19: 10 mg via ORAL
  Filled 2022-04-18: qty 2

## 2022-04-18 MED ORDER — ROSUVASTATIN CALCIUM 5 MG PO TABS: 10.0000 mg | ORAL_TABLET | Freq: Every day | ORAL | Status: DC

## 2022-04-18 MED ORDER — AMLODIPINE BESYLATE 10 MG PO TABS
10.0000 mg | ORAL_TABLET | Freq: Every day | ORAL | Status: DC
  Administered 2022-04-18 – 2022-04-19 (×2): 10 mg via ORAL
  Filled 2022-04-18 (×2): qty 1

## 2022-04-18 MED ORDER — LEFLUNOMIDE 10 MG PO TABS
20.0000 mg | ORAL_TABLET | Freq: Every day | ORAL | Status: DC
  Administered 2022-04-18 – 2022-04-19 (×2): 20 mg via ORAL
  Filled 2022-04-18 (×2): qty 2

## 2022-04-18 MED ORDER — ACETAMINOPHEN 160 MG/5ML PO SOLN: 650.0000 mg | ORAL | Status: DC | PRN

## 2022-04-18 MED ORDER — ASPIRIN 81 MG PO TABS: 81.0000 mg | ORAL_TABLET | Freq: Every day | ORAL | Status: DC

## 2022-04-18 MED ORDER — IOHEXOL 350 MG/ML SOLN
75.0000 mL | Freq: Once | INTRAVENOUS | Status: AC | PRN
  Administered 2022-04-18: 75 mL via INTRAVENOUS

## 2022-04-18 MED ORDER — MOMETASONE FURO-FORMOTEROL FUM 200-5 MCG/ACT IN AERO
2.0000 | INHALATION_SPRAY | Freq: Two times a day (BID) | RESPIRATORY_TRACT | Status: DC
  Administered 2022-04-19: 2 via RESPIRATORY_TRACT
  Filled 2022-04-18: qty 8.8

## 2022-04-18 MED ORDER — ACETAMINOPHEN 325 MG PO TABS: 650.0000 mg | ORAL_TABLET | ORAL | Status: DC | PRN

## 2022-04-18 MED ORDER — MONTELUKAST SODIUM 10 MG PO TABS
10.0000 mg | ORAL_TABLET | Freq: Every day | ORAL | Status: DC
  Administered 2022-04-18: 10 mg via ORAL
  Filled 2022-04-18: qty 1

## 2022-04-18 MED ORDER — SENNOSIDES-DOCUSATE SODIUM 8.6-50 MG PO TABS: 1.0000 | ORAL_TABLET | Freq: Every evening | ORAL | Status: DC | PRN

## 2022-04-18 MED ORDER — TAMSULOSIN HCL 0.4 MG PO CAPS
0.4000 mg | ORAL_CAPSULE | Freq: Every day | ORAL | Status: DC
  Administered 2022-04-18 – 2022-04-19 (×2): 0.4 mg via ORAL
  Filled 2022-04-18 (×2): qty 1

## 2022-04-18 MED ORDER — GADOBUTROL 1 MMOL/ML IV SOLN
10.0000 mL | Freq: Once | INTRAVENOUS | Status: AC | PRN
  Administered 2022-04-18: 10 mL via INTRAVENOUS

## 2022-04-18 MED ORDER — ONDANSETRON HCL 4 MG/2ML IJ SOLN: 4.0000 mg | Freq: Four times a day (QID) | INTRAMUSCULAR | Status: DC | PRN

## 2022-04-18 MED ORDER — ALBUTEROL SULFATE (2.5 MG/3ML) 0.083% IN NEBU: 2.5000 mg | INHALATION_SOLUTION | Freq: Four times a day (QID) | RESPIRATORY_TRACT | Status: DC | PRN

## 2022-04-18 MED ORDER — METOPROLOL SUCCINATE ER 25 MG PO TB24
25.0000 mg | ORAL_TABLET | Freq: Every day | ORAL | Status: DC
  Administered 2022-04-18 – 2022-04-19 (×2): 25 mg via ORAL
  Filled 2022-04-18 (×2): qty 1

## 2022-04-18 MED ORDER — ACETAMINOPHEN 650 MG RE SUPP: 650.0000 mg | RECTAL | Status: DC | PRN

## 2022-04-18 MED ORDER — ASPIRIN 81 MG PO TBEC
81.0000 mg | DELAYED_RELEASE_TABLET | Freq: Every day | ORAL | Status: DC
  Administered 2022-04-18 – 2022-04-19 (×2): 81 mg via ORAL
  Filled 2022-04-18 (×2): qty 1

## 2022-04-18 MED ORDER — LORAZEPAM 2 MG/ML IJ SOLN
0.2500 mg | Freq: Once | INTRAMUSCULAR | Status: AC
  Administered 2022-04-18: 0.25 mg via INTRAVENOUS
  Filled 2022-04-18: qty 1

## 2022-04-18 MED ORDER — IOHEXOL 350 MG/ML SOLN
80.0000 mL | Freq: Once | INTRAVENOUS | Status: AC | PRN
  Administered 2022-04-18: 80 mL via INTRAVENOUS

## 2022-04-18 MED ORDER — STROKE: EARLY STAGES OF RECOVERY BOOK
Freq: Once | Status: AC
  Filled 2022-04-18: qty 1

## 2022-04-18 MED ORDER — ENOXAPARIN SODIUM 40 MG/0.4ML IJ SOSY
40.0000 mg | PREFILLED_SYRINGE | INTRAMUSCULAR | Status: DC
  Administered 2022-04-18 – 2022-04-19 (×2): 40 mg via SUBCUTANEOUS
  Filled 2022-04-18 (×2): qty 0.4

## 2022-04-18 NOTE — ED Provider Notes (Signed)
Patient transferred from [facility] emergency department to further evaluate abnormal head CT.  Patient presented with dizziness, generalized malaise and weakness.  Head CT there was concerning for hypodensity that could possibly be consistent with stroke.  MRI has been performed here and there does appear to be possible subacute stroke correlating to the CT findings.  Patient discussed with Dr. Curly Shores who has reviewed the imaging.  History does not completely correlate with these MRI findings and therefore Dr. Curly Shores is recommending further workup.  Will perform CT angio head and neck, MRI with contrast patient is to be admitted to hospitalist service.   Orpah Greek, MD 04/18/22 0430

## 2022-04-18 NOTE — Progress Notes (Addendum)
STROKE TEAM PROGRESS NOTE   INTERVAL HISTORY No family at the bedside.  Patient seen in ED.   Patient came to ED yesterday afternoon due to increased lethargy, weakness in both legs for an unknown number of days.  Recent history includes acute asthma exacerbation around 3 weeks ago for which his PCP gave him a prednisone taper.  Head CT showed vague hypodensity in the thalamus. 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 Recommend follow-up MRI in a few weeks.   On assessment, patient is alert oriented.  Follows commands, moving all extremities.   Very slight left facial droop seen when smiling.  Stroke team recommends aspirin 81 to continue on discharge.  If no arrhythmias are captured during hospital stay, we recommend Zio patch/Loop recorder after discharge.  Statin started for LDL above goal.   Vitals:   04/18/22 0900 04/18/22 1100 04/18/22 1200 04/18/22 1224  BP: (!) 129/57 131/84  (!) 145/93  Pulse: (!) 56   81  Resp: 12 (!) 26    Temp:   98.6 F (37 C) 98.4 F (36.9 C)  TempSrc:    Oral  SpO2: 97%   94%  Weight:      Height:       CBC:  Recent Labs  Lab 04/17/22 1959  WBC 5.7  HGB 12.7*  HCT 39.3  MCV 96.8  PLT 209   Basic Metabolic Panel:  Recent Labs  Lab 04/17/22 1959  NA 136  K 4.0  CL 106  CO2 25  GLUCOSE 98  BUN 13  CREATININE 1.28*  CALCIUM 9.1   Lipid Panel:  Recent Labs  Lab 04/18/22 1254  CHOL 188  TRIG 59  HDL 51  CHOLHDL 3.7  VLDL 12  LDLCALC 035*   HgbA1c:  Recent Labs  Lab 04/18/22 1254  HGBA1C 5.1   Urine Drug Screen: No results for input(s): "LABOPIA", "COCAINSCRNUR", "LABBENZ", "AMPHETMU", "THCU", "LABBARB" in the last 168 hours.  Alcohol Level No results for input(s): "ETH" in the last 168 hours.  IMAGING past 24 hours ECHOCARDIOGRAM COMPLETE  Result Date: 04/18/2022    ECHOCARDIOGRAM REPORT   Patient Name:   Brandon [name] Date of Exam: 04/18/2022 Medical Rec #:  [id number]        Height:       72.0 in Accession #:    [id number]      Weight:       232.0 lb Date of Birth:  December 21, 1940      BSA:          2.269 m Patient Age:    81 years        BP:           135/89 mmHg Patient Gender: M               HR:           66 bpm. Exam Location:  Inpatient Procedure: 2D Echo, 3D Echo, Cardiac Doppler and Color Doppler Indications:    Stroke  History:        Patient has prior history of Echocardiogram examinations, most                 recent 01/02/2019. CHF and Cardiomyopathy, CAD, Arrythmias:RBBB;                 Risk Factors:Hypertension, Sleep Apnea and Asthma. CKD.  Sonographer:    Milda Smart Referring Phys: [id number] RONDELL A SMITH  Sonographer Comments: Suboptimal  parasternal window. Image acquisition challenging due to respiratory motion. IMPRESSIONS  1. Left ventricular ejection fraction, by estimation, is 45 to 50%. The left ventricle has mildly decreased function. The left ventricle demonstrates global hypokinesis. Left ventricular diastolic parameters are indeterminate.  2. Right ventricular systolic function is normal. The right ventricular size is normal.  3. Left atrial size was moderately dilated.  4. No evidence of mitral valve regurgitation.  5. The aortic valve is grossly normal. Aortic valve regurgitation is not visualized.  6. The inferior vena cava is normal in size with greater than 50% respiratory variability, suggesting right atrial pressure of 3 mmHg. FINDINGS  Left Ventricle: Left ventricular ejection fraction, by estimation, is 45 to 50%. The left ventricle has mildly decreased function. The left ventricle demonstrates global hypokinesis. The left ventricular internal cavity size was normal in size. There is  no left ventricular hypertrophy. Left ventricular diastolic parameters are indeterminate. Right Ventricle: The right ventricular size is normal. Right ventricular systolic function is normal. Left Atrium: Left atrial size was moderately dilated. Right Atrium: Right  atrial size was normal in size. Pericardium: There is no evidence of pericardial effusion. Mitral Valve: No evidence of mitral valve regurgitation. Tricuspid Valve: Tricuspid valve regurgitation is not demonstrated. Aortic Valve: The aortic valve is grossly normal. Aortic valve regurgitation is not visualized. Pulmonic Valve: Pulmonic valve regurgitation is not visualized. Aorta: The aortic root and ascending aorta are structurally normal, with no evidence of dilitation. Venous: The inferior vena cava is normal in size with greater than 50% respiratory variability, suggesting right atrial pressure of 3 mmHg. IAS/Shunts: No atrial level shunt detected by color flow Doppler.  LEFT VENTRICLE PLAX 2D LVIDd:         6.60 cm      Diastology LVIDs:         5.80 cm      LV e' medial:    4.35 cm/s LV PW:         0.90 cm      LV E/e' medial:  10.7 LV IVS:        0.50 cm      LV e' lateral:   5.55 cm/s LVOT diam:     2.00 cm      LV E/e' lateral: 8.4 LVOT Area:     3.14 cm  LV Volumes (MOD) LV vol d, MOD A2C: 222.0 ml 3D Volume EF: LV vol d, MOD A4C: 245.0 ml 3D EF:        39 % LV vol s, MOD A2C: 131.0 ml LV EDV:       269 ml LV vol s, MOD A4C: 137.0 ml LV ESV:       165 ml LV SV MOD A2C:     91.0 ml  LV SV:        104 ml LV SV MOD A4C:     245.0 ml LV SV MOD BP:      100.4 ml RIGHT VENTRICLE             IVC RV S prime:     10.80 cm/s  IVC diam: 1.50 cm TAPSE (M-mode): 2.3 cm LEFT ATRIUM              Index        RIGHT ATRIUM           Index LA diam:        5.50 cm  2.42 cm/m   RA Area:     17.10  cm LA Vol (A2C):   137.0 ml 60.37 ml/m  RA Volume:   43.80 ml  19.30 ml/m LA Vol (A4C):   111.0 ml 48.91 ml/m LA Biplane Vol: 132.0 ml 58.17 ml/m   AORTA Ao Root diam: 3.40 cm Ao Asc diam:  3.30 cm MITRAL VALVE MV Area (PHT): 1.95 cm    SHUNTS MV Decel Time: 390 msec    Systemic Diam: 2.00 cm MR Peak grad: 17.8 mmHg MR Vmax:      211.00 cm/s MV E velocity: 46.60 cm/s Carolan Clines Electronically signed by Carolan Clines Signature  Date/Time: 04/18/2022/12:08:01 PM    Final    CT CHEST ABDOMEN PELVIS W CONTRAST  Result Date: 04/18/2022 CLINICAL DATA:  Metastatic disease evaluation, brain lesion * Tracking Code: BO * EXAM: CT CHEST, ABDOMEN, AND PELVIS WITH CONTRAST TECHNIQUE: Multidetector CT imaging of the chest, abdomen and pelvis was performed following the standard protocol during bolus administration of intravenous contrast. RADIATION DOSE REDUCTION: This exam was performed according to the departmental dose-optimization program which includes automated exposure control, adjustment of the mA and/or kV according to patient size and/or use of iterative reconstruction technique. CONTRAST:  31mL OMNIPAQUE IOHEXOL 350 MG/ML SOLN COMPARISON:  CT chest angiogram, 03/25/2022 FINDINGS: CT CHEST FINDINGS Cardiovascular: Aortic atherosclerosis. Normal heart size. Left coronary artery calcifications. No pericardial effusion. Mediastinum/Nodes: No enlarged mediastinal, hilar, or axillary lymph nodes. Thyroid gland, trachea, and esophagus demonstrate no significant findings. Lungs/Pleura: Parke Simmers appearing dependent bibasilar scarring and or atelectasis. No pleural effusion or pneumothorax. Musculoskeletal: No chest wall abnormality. No acute osseous findings. CT ABDOMEN PELVIS FINDINGS Hepatobiliary: No solid liver abnormality is seen. No gallstones, gallbladder wall thickening, or biliary dilatation. Pancreas: Unremarkable. No pancreatic ductal dilatation or surrounding inflammatory changes. Spleen: Normal in size without significant abnormality. Adrenals/Urinary Tract: Small intermediate attenuation adrenal nodules, measuring 2.0 x 1.5 cm in the right adrenal body (series 3, image 49) and 1.4 x 1.0 cm in the medial limb of the left adrenal gland (series 3, image 54). Simple, benign bilateral renal cortical cysts, for which no further follow-up or characterization is required. Punctuate nonobstructive calculus of the inferior pole of the right kidney  (series 6, image 97). Evaluation for additional calculi is generally limited by the presence of excreted contrast in the renal collecting systems. Lobulated, multifocal bilateral renal cortical scarring. No hydronephrosis. Mild diffuse urinary bladder wall thickening, likely secondary to chronic outlet obstruction. Stomach/Bowel: Stomach is within normal limits. Appendix appears normal. No evidence of bowel wall thickening, distention, or inflammatory changes. Descending and sigmoid diverticulosis. Vascular/Lymphatic: Severe mixed aortic atherosclerosis. No enlarged abdominal or pelvic lymph nodes. Reproductive: Prostatomegaly. Other: No abdominal wall hernia or abnormality. No ascites. Musculoskeletal: No acute osseous findings. IMPRESSION: 1. No definite evidence of primary malignancy or metastatic disease in the chest, abdomen, or pelvis. 2. Small intermediate attenuation adrenal nodules, measuring 2.0 x 1.5 cm in the right adrenal body and 1.4 x 1.0 cm in the medial limb of the left adrenal gland. 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 These could be further characterized by adrenal protocol CT or MRI if clinically appropriate. 3. Punctuate nonobstructive calculus of the inferior pole of the right kidney. Evaluation for additional calculi is generally limited by the presence of excreted contrast in the renal collecting systems. No hydronephrosis. 4. Lobulated, multifocal bilateral renal cortical scarring, consistent with infectious, obstructive, or ischemic insult 5. Prostatomegaly with mild diffuse urinary bladder wall thickening, likely  secondary to chronic outlet obstruction. 6. Coronary artery disease. Aortic Atherosclerosis (ICD10-I70.0). Electronically Signed   By: Jearld Lesch M.D.   On: 04/18/2022 10:00   CT ANGIO HEAD NECK W WO CM  Result Date: 04/18/2022 CLINICAL DATA:  Abnormal right thalamus. EXAM: CT  ANGIOGRAPHY HEAD AND NECK TECHNIQUE: Multidetector CT imaging of the head and neck was performed using the standard protocol during bolus administration of intravenous contrast. Multiplanar CT image reconstructions and MIPs were obtained to evaluate the vascular anatomy. Carotid stenosis measurements (when applicable) are obtained utilizing NASCET criteria, using the distal internal carotid diameter as the denominator. RADIATION DOSE REDUCTION: This exam was performed according to the departmental dose-optimization program which includes automated exposure control, adjustment of the mA and/or kV according to patient size and/or use of iterative reconstruction technique. CONTRAST:  62mL OMNIPAQUE IOHEXOL 350 MG/ML SOLN COMPARISON:  Brain MRI from earlier today FINDINGS: CTA NECK FINDINGS Aortic arch: Atheromatous plaque with 3 vessel branching. Right carotid system: Vessels are smoothly contoured and widely patent. No atheromatous changes Left carotid system: Vessels are smoothly contoured and widely patent. No atheromatous changes Vertebral arteries: Negative subclavian and vertebral arteries. 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 Upper chest: Clear apical lungs Review of the MIP images confirms the above findings CTA HEAD FINDINGS Anterior circulation: No significant stenosis, proximal occlusion, aneurysm, or vascular malformation. Mild atheromatous plaque at the carotid siphons. Posterior circulation: No significant stenosis, proximal occlusion, aneurysm, or vascular malformation. No notable finding at the P1 segments. Venous sinuses: Unremarkable Anatomic variants: None significant Review of the MIP images confirms the above findings IMPRESSION: No emergent finding. Less than typical atheromatous changes for age with no stenosis or irregularity of major arteries in the head and neck. Electronically Signed   By: Tiburcio Pea M.D.   On: 04/18/2022 06:58   MR  BRAIN W CONTRAST  Result Date: 04/18/2022 CLINICAL DATA:  Acute neurologic deficit with stroke suspected. Rule out malignancy. EXAM: MRI HEAD WITH CONTRAST TECHNIQUE: Multiplanar, multiecho pulse sequences of the brain and surrounding structures were obtained with intravenous contrast. CONTRAST:  27mL GADAVIST GADOBUTROL 1 MMOL/ML IV SOLN COMPARISON:  Brain MRI from earlier today FINDINGS: Brain: The T2 hyperintensity in the anterior right thalamus has wispy internal enhancement. Subacute infarcts enhance although a less reliable finding in the subcortical brain. No second lesion is seen. No hydrocephalus or collection. Vascular: Normal vascular enhancements Skull and upper cervical spine: No focal marrow lesion Sinuses/Orbits: Negative IMPRESSION: Wispy enhancement in the right thalamic lesion which is nonspecific for differentiating subacute infarct from mass. The lesion is large for an infarct in this location and viewed with suspicion, recommend follow-up enhanced brain MRI in a few weeks. Electronically Signed   By: Tiburcio Pea M.D.   On: 04/18/2022 06:38   MR Brain Wo Contrast (neuro protocol)  Result Date: 04/18/2022 CLINICAL DATA:  Acute neurologic deficit EXAM: MRI HEAD WITHOUT CONTRAST TECHNIQUE: Multiplanar, multiecho pulse sequences of the brain and surrounding structures were obtained without intravenous contrast. COMPARISON:  None Available. FINDINGS: Brain: Focal area of signal abnormality in the ventral right thalamus likely a recent subacute infarct. Chronic microhemorrhage of the right cerebellum. There is multifocal hyperintense T2-weighted signal within the white matter. Generalized volume loss. The midline structures are normal. Vascular: Normal flow voids. Skull and upper cervical spine: Normal marrow signal. Sinuses/Orbits: Negative. Other: None. IMPRESSION: 1. Focal area of signal abnormality in the ventral right thalamus likely a recent subacute infarct. No  hemorrhage or mass effect.  2. Findings of chronic microvascular ischemia and volume loss. Electronically Signed   By: Deatra Robinson M.D.   On: 04/18/2022 03:49   CT Head Wo Contrast  Addendum Date: 04/17/2022   ADDENDUM REPORT: 04/17/2022 21:03 ADDENDUM: Critical Value/emergent results were called by telephone at the time of interpretation on 04/17/2022 at 9:03 pm to provider Vanetta Mulders , who verbally acknowledged these results. Electronically Signed   By: Thornell Sartorius M.D.   On: 04/17/2022 21:03   Result Date: 04/17/2022 CLINICAL DATA:  Neuro deficit, acute, stroke suspected. Weakness, lack of injury, dizziness. EXAM: CT HEAD WITHOUT CONTRAST TECHNIQUE: Contiguous axial images were obtained from the base of the skull through the vertex without intravenous contrast. RADIATION DOSE REDUCTION: This exam was performed according to the departmental dose-optimization program which includes automated exposure control, adjustment of the mA and/or kV according to patient size and/or use of iterative reconstruction technique. COMPARISON:  None Available. FINDINGS: Brain: No acute intracranial hemorrhage, midline shift or mass effect. No extra-axial fluid collection. Scattered periventricular white matter hypodensities are present bilaterally. No hydrocephalus. Calcification is noted in the basal ganglia on the right. There is a vague hypodense region in the thalamus on the right which is indeterminate in age. Vascular: No hyperdense vessel or unexpected calcification. Skull: Normal. Negative for fracture or focal lesion. Sinuses/Orbits: Round density is noted in the ethmoid air cells on the right, possible mucosal retention cyst versus polyp. No acute orbital abnormality. 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 IMPRESSION: 1. Vague hypodensity in the thalamus on the right, possible infarct of indeterminate age. MRI is recommended for further evaluation. 2. Chronic microvascular ischemic changes.  Electronically Signed: By: Thornell Sartorius M.D. On: 04/17/2022 20:58   DG Chest Port 1 View  Result Date: 04/17/2022 CLINICAL DATA:  Weakness.  History of congestive heart failure. EXAM: PORTABLE CHEST 1 VIEW COMPARISON:  January 11 current right 4, chest CT FINDINGS: Tortuosity of the thoracic aorta. Cardiomediastinal silhouette is normal. Mediastinal contours appear intact. There is no evidence of focal airspace consolidation, pleural effusion or pneumothorax. Osseous structures are without acute abnormality. Soft tissues are grossly normal. IMPRESSION: No active disease. Electronically Signed   By: Ted Mcalpine M.D.   On: 04/17/2022 20:57    PHYSICAL EXAM Constitutional: Appears well-developed and well-nourished.  Psych: Pleasant and cooperative HEENT: [place].AT MSK: no joint deformities.  Cardiovascular: Normal rate and regular rhythm.  Respiratory:unlabored breathing,  GI: Soft. No distension.  No tenderness. Skin: Warm dry and intact visible skin   Neuro: Mental Status: Patient is awake, alert, oriented to person, place, month, year, and situation. Patient is able to give a clear and coherent history. No signs of aphasia or neglect Cranial Nerves: II: Visual Fields are full. PERRL.  III,IV, VI: ?Left eye ptosis, but reports left eye opening is limited at baseline bilaterally.  EOMI without diplopia V: Facial sensation is symmetric to light touch VII: Slight left droop when smiling VIII: hearing is intact to voice X: Uvula elevates symmetrically XI: Shoulder shrug is symmetric. XII: tongue is midline Motor: Tone is normal. Bulk is normal. No drift  in all four extremities Sensory: Sensation is symmetric to light touch throughout. Cerebellar: FNF and RAM are intact bilaterally Gait:  Deferred   ASSESSMENT/PLAN Mr. Semaje E [name] is a 82 y.o. male with past medical history significant for rheumatoid arthritis on leflunomide, hypertension, coronary artery disease,  congestive heart failure, right  bundle branch block, known ischemic cardiomyopathy, obstructive sleep apnea, asthma, CKD stage III AA, BMI 31.46, heme positive stool.   Approximately 3 weeks ago he presented to his primary care physician with concern for acute asthma exacerbation for which she was started on a prednisone taper. After taking, his symptoms improved.  However, he began to have gradual, progressive loss of energy and generalized weakness along with shortness of breath and bilateral foot numbness. He was having increased lethargy both legs were feeling weak and he felt like the neuropathy would improve with movement and worsen with sitting on hard surfaces such as the commode.  He was initially reluctant to come to the ED for evaluation but due to persistent symptoms his family convinced him to come  Imaging is concerning for acute/subacute infarct involving the right thalamus. Infiltrative lesion is also on the differential diagnosis. Recommend follow-up MRI in a few weeks.   CT head: Vague hypodensity in the thalamus on the right, possible infarct of indeterminate age. MRI is recommended for further evaluation. Chronic microvascular ischemic changes CTA head & neck: No emergent finding. Less than typical atheromatous changes for age with no stenosis or irregularity of major arteries in the head and neck  MRI w/o: Focal area of signal abnormality in the ventral right thalamus likely a recent subacute infarct. No hemorrhage or mass effect. Findings of chronic microvascular ischemia and volume los MRI w/: Wispy enhancement in the right thalamic lesion which is nonspecific for differentiating subacute infarct from mass. The lesion is large for an infarct in this location and viewed with suspicion, recommend  follow-up enhanced brain MRI in a few weeks  2D Echo: LVEF 45-50%, No LVH  Recommend Zio patch on discharge if no arrhythmias are captured during hospital stay. LDL  HgbA1c  VTE  prophylaxis - SCDs    Diet   Diet Heart Room service appropriate? Yes; Fluid consistency: Thin   aspirin 81 mg daily prior to admission, now on aspirin 81 mg daily, Continue aspirin at discharge.  Holding off on Plavix at this time given unclear time of symptom onset. Therapy recommendations:  pending Disposition:  pending  Hypertension Home meds:  Toprol-XL, Norvasc, 10mg  Long-term BP goal normotensive  Hyperlipidemia Home meds:  none LDL 125, goal < 70 Add Crestor 10mg   Continue statin at discharge   Other Stroke Risk Factors Advanced Age >/= 32  Obesity, Body mass index is 31.46 kg/m., BMI >/= 30 associated with increased stroke risk, recommend weight loss, diet and exercise as appropriate  ? Obstructive sleep apnea Congestive heart failure Entresto, Aldactone, Lasix at home  Other Active Problems Rheumatoid Arthritis Arava at home  Asthma Singulair, Albeuterol, Advair inhaler at home  Hospital day # 0   Pt seen by Neuro NP/APP and later by MD. Note/plan to be edited by MD as needed.    Stroke team will sign off with recommendations as above.  Please call with any further questions or concerns.  Recommend follow-up MRI.  Follow-up with outpatient neurology in 8 weeks.  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  No artery of pecheron to account for bilateral thalamic nature of the stroke.  Recommend checking thiamine level.  B12 is high.  He does have a history of rheumatoid arthritis.  He will need an outpatient MRI with and without contrast to  further delineate if there is a malignancy.  Aspirin 81 mg for stroke prevention.  Zio patch to look for atrial fibrillation on discharge. Dr. Curly Shores contacted tumor board to review his case.  Neurology sign off please call with  questions.  Same-day note no charge.  Dr. Reeves Forth evaluated pt independently, reviewed imaging, chart, labs. Discussed and formulated plan with the Resident/APP. Changes were made to the note where appropriate. Please see APP/resident note above for details.   Total 36 minutes spent on counseling patient and coordinating care, writing notes and reviewing chart.   Deaundra Kutzer,MD    To contact Stroke Continuity provider, please refer to [email or web address] After hours, contact General Neurology

## 2022-04-18 NOTE — Progress Notes (Signed)
Pt on room air and tolerating. Pt refusing CPAP QHS at this time. Will continue to monitor

## 2022-04-18 NOTE — Progress Notes (Signed)
  Echocardiogram 2D Echocardiogram has been performed.  Eartha Inch 04/18/2022, 11:03 AM

## 2022-04-18 NOTE — ED Notes (Signed)
Echo at bedside

## 2022-04-18 NOTE — H&P (Addendum)
History and Physical    Patient: Brandon [name] WNU:[id number] DOB: Jul 31, 1940 DOA: 04/17/2022 DOS: the patient was seen and examined on 04/18/2022 PCP: Debbrah Alar, NP  Patient coming from: Home  Chief Complaint:  Chief Complaint  Patient presents with   Dizziness   HPI: Brandon [name] is a 82 y.o. male with medical history significant of hypertension, HFrEF, asthma, CKD stage IIIa, anemia, OSA, and obesity who presents with complaints of dizziness and weakness.  Patient makes note that his symptoms started approximately 1 month ago when he had an asthma exacerbation.  He was started on a prednisone taper with reports of improvement in wheezing and some shortness of breath.  Over the last 4-5 days he reports that he has been more lethargic and reports feelings of dizziness.  States that it feels like he could pass out and that it may feel a little like the room is spinning around him.  Symptoms occur with him sitting and with activity.  Associated symptoms include nausea, blurred vision even when using his corrective glasses, chronic intermittent productive cough, chronic numbness in both legs left worse than the right, and had some lower extremity swelling which he had taken Lasix 20 mg once a day for 4 days last week.  Patient notes that this leg swelling has resolved since doing so.  Patient does report that the numbness in his feet is worse if he sits on like toilet seat, but denies having any recent falls or loss of consciousness related to this.  In the emergency department patient was afebrile with pulse reported to range from 39-76, and all other vital signs within normal limits.  CT scan of the brain noted a vague hypodensity in the thalamus on the right possible infarction of indeterminate age.  Labs from 2/3 appear to be around patient's baseline.  Influenza, COVID-19, and RSV screening were negative.  Urinalysis did not show significant signs for infection.  MRI of the brain gave  concern for focal area of signal abnormality in the ventral right thalamus thought to represent a recent subacute infarct.  Neurology recommended follow-up MRI brain with contrast which noted wispy enhancement of the right thalamic lesion which was nonspecific for differentiating subacute infarct from mass.  CT angiogram of the head and neck noted no large vessel occlusion.  Patient had received Ativan IV to obtain the MRIs.  Neurology placed orders for CT scan of the chest abdomen pelvis as malignancy was thought to be likely cause for the findings and recommended obtaining echocardiogram.    Review of Systems: As mentioned in the history of present illness. All other systems reviewed and are negative. Past Medical History:  Diagnosis Date   Anemia    Asthma    Hx of childhood asthma, disappeared for a while, then resurfaced 6-7 years ago.    Cardiomyopathy    with a negative cardiac catheterization in the past. 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    Past Surgical History:  Procedure Laterality Date   None     Social History:  reports that he has quit smoking. He has never used smokeless tobacco. He reports current alcohol use. He reports that he does not use drugs.  Allergies  Allergen Reactions   Ace Inhibitors Cough  Isosorb Dinitrate-Hydralazine Other (See Comments)    REACTION: dizziness/hypotensive    Family History  Problem Relation Age of Onset   Cancer Father        multiple melanoma   Lupus Sister        died at 16   Diabetes Mellitus II Sister        died from covid-19   Asthma Daughter    Neuropathy Sister    Colon cancer Neg Hx    Esophageal cancer Neg Hx     Prior to Admission medications   Medication Sig Start Date End Date Taking? Authorizing Provider  albuterol  (PROVENTIL) (2.5 MG/3ML) 0.083% nebulizer solution TAKE 3 MLS BY NEBULIZER EVERY 6 HOURS AS NEEDED FOR WHEEZING FOR SHORTNESS OF BREATH Patient taking differently: Take 2.5 mg by nebulization every 6 (six) hours as needed for wheezing or shortness of breath. 02/23/22  Yes [name], NP  albuterol (VENTOLIN HFA) 108 (90 Base) MCG/ACT inhaler Inhale 1-2 puffs into the lungs every 6 (six) hours as needed for wheezing or shortness of breath. 11/03/21  Yes [name], NP  amLODipine (NORVASC) 10 MG tablet Take 1 tablet by mouth once daily 04/15/22  Yes [name], NP  aspirin 81 MG tablet Take 81 mg by mouth daily.   Yes [provider]  b complex vitamins tablet Take 1 tablet by mouth daily. 01/23/19  Yes [name], NP  fluticasone-salmeterol (ADVAIR) 500-50 MCG/ACT AEPB Inhale 1 puff into the lungs in the morning and at bedtime. 12/18/21  Yes [name], NP  furosemide (LASIX) 20 MG tablet Take 1 tablet (20 mg total) by mouth daily. Patient taking differently: Take 20 mg by mouth daily as needed for fluid. 03/30/22  Yes [name], NP  leflunomide (ARAVA) 20 MG tablet Take 20 mg by mouth daily. 05/31/19  Yes [provider]  metoprolol succinate (TOPROL-XL) 25 MG 24 hr tablet Take 1 tablet (25 mg total) by mouth daily. 01/29/22  Yes [name], NP  montelukast (SINGULAIR) 10 MG tablet Take 1 tablet (10 mg total) by mouth at bedtime. 11/03/21  Yes [name], NP  sacubitril-valsartan (ENTRESTO) 97-103 MG Take 1 tablet by mouth 2 (two) times daily. (For medication assistance program) 11/20/21  Yes [name], MD  spironolactone (ALDACTONE) 25 MG tablet Take 1 tablet (25 mg total) by mouth daily. 11/03/21  Yes [name], NP  tamsulosin (FLOMAX) 0.4 MG CAPS capsule Take 1 capsule (0.4 mg total) by mouth daily. 11/03/21  Yes [name], NP    Physical Exam: Vitals:   04/17/22 2230 04/17/22 2354  04/18/22 0003 04/18/22 0429  BP: 139/82 (!) 130/94  135/89  Pulse: 69 70  75  Resp:  20  20  Temp:   99 F (37.2 C) 98.2 F (36.8 C)  TempSrc:   Oral Oral  SpO2: 97% 94%  97%  Weight:      Height:        Constitutional: Elderly male who appears to be in no acute distress at this time Eyes: PERRL, lids and conjunctivae normal ENMT: Mucous membranes are moist.  Fair dentition Neck: normal, supple,  Respiratory: clear to auscultation bilaterally, no wheezing, no crackles. Normal respiratory effort. No accessory muscle use.  Cardiovascular: Regular rate and rhythm, no murmurs / rubs / gallops. No extremity edema.   Abdomen: no tenderness, no masses palpated. Bowel sounds positive.  Musculoskeletal: no clubbing / cyanosis. No joint deformity upper and lower extremities. Good ROM, no contractures. Normal muscle  tone.  Skin: no rashes, lesions, ulcers. No induration Neurologic: CN 2-12 grossly intact. .Strength 5/5 in all 4.  Psychiatric: Normal judgment and insight. Alert and oriented x 3. Normal mood.   Data Reviewed:  EKG noted to show sinus rhythm at 77 bpm with multiple PVCs with RBBB and LAFB.  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  Patient presented with complaints of dizziness.  MRI noted concern for the possibility of a subacute infarct of the right thalamus.  Neurology had concerns that this may not be a stroke and ordered a follow-up MRI of the brain with contrast.  MRI with contrast noted right thalamic lesion which they were unable to differentiate mass versus stroke recommending repeat MRI in a few weeks. -Admit to a telemetry bed -Stroke order set utilized -Neurochecks -Check orthostatic vitals -Check hemoglobin A1c, lipid panel, TSH, and vitamin B -Check echocardiogram -Check CT scan of the chest, abdomen, and pelvis for malignancy screen -PT/OT/speech to evaluate and  treat -Continue home aspirin dose of 81 mg daily -Recommended patient have a 30-day event monitor on discharge if felt to be stroke -Appreciate neurology consultative services, we will follow-up for further recommendations  Frequent PVCs Possible Bradycardia Patient documented to have heart rate as low as 39 around 9:30 pm yesterday evening with stable blood pressures.  He is on metoprolol for blood pressure.  Initial EKG revealed sinus rhythm with multiple PVCs,  so unclear at this time if that was accurate.  He denies having any episodes of palpitations, but has felt lightheaded even while at rest. -Follow-up telemetry  Heart failure with reduced EF Chronic.  Clinically patient appears to be euvolemic at this time.  Records note he had an elevated proBNP of 1464 on 03/22/2022.  Last EF noted to be around 45-50% with indeterminate diastolic diastolic function in 57/3220. -Strict I&O's and daily weights -Held diuretics as question patient being possibly hypovolemic with recent diuretic use as the cause of some of his symptoms  Essential hypertension Home blood pressure regimen includes amlodipine 10 mg daily, Entresto 97-103 mg twice daily, spironolactone 25 mg daily, metoprolol succinate 25 mg daily, and furosemide 20 mg as needed for fluid. -Continue amlodipine and metoprolol(holding parameters in place) -Resume Entresto and spironolactone as long as kidney function noted to be stable after IV contrast  CKD stage IIIa Stable.  Creatinine 1.28 which appears around his baseline 1.1-1.4. -Continue to monitor  Asthma, without exacerbation Patient reported having asthma exasperation back in January requiring steroids taper.  Since that time wheezing had resolved and states that his breathing is improved. -Continue Singulair, pharmacy substitution for Advair, and albuterol nebs as needed for shortness of breath/wheezing  Normocytic anemia Hemoglobin 12.7 which appears around his  baseline. -Continue to monitor  RA -Continue leflunomide  BPH -Continue Flomax -Patient should follow up with his urologist in the outpatient setting.  OSA -Continue CPAP at night  Obesity BMI 31.46 kg/m  DVT prophylaxis: Lovenox Advance Care Planning:   Code Status: Full Code   Consults: Neurology  Family Communication: Wife updated over the phone  Severity of Illness: The appropriate patient status for this patient is INPATIENT. Inpatient status is judged to be reasonable and necessary in order to provide the required intensity of service to ensure the patient's safety. The patient's presenting symptoms, physical exam findings, and initial radiographic and laboratory data in the context of their chronic comorbidities is felt to  place them at high risk for further clinical deterioration. Furthermore, it is not anticipated that the patient will be medically stable for discharge from the hospital within 2 midnights of admission.   * I certify that at the point of admission it is my clinical judgment that the patient will require inpatient hospital care spanning beyond 2 midnights from the point of admission due to high intensity of service, high risk for further deterioration and high frequency of surveillance required.*  Author: Norval Morton, MD 04/18/2022 7:08 AM  For on call review [email or web address].

## 2022-04-18 NOTE — ED Notes (Signed)
Patient leaving the floor in stable condition, AOX4, with staff and his belongings.  

## 2022-04-18 NOTE — Consult Note (Addendum)
Neurology Consultation Reason for Consult: Hypodensity on head CT  Requesting Physician: Frutoso Chase   CC: Fatigue, dizziness, mild gait imbalance   History is obtained from: Patient and chart review  HPI: Brandon [name] is a 82 y.o. male with past medical history significant for rheumatoid arthritis on leflunomide, hypertension, coronary artery disease, congestive heart failure, right bundle branch block, known ischemic cardiomyopathy, obstructive sleep apnea, asthma, CKD stage III AA, BMI 31.46, heme positive stool   Approximately 3 weeks ago he presented to his primary care physician with concern for acute asthma exacerbation for which she was started on a prednisone taper.  He took the prednisone and overall his symptoms improved.  However subsequently he began to have gradually progressive loss of energy and generalized weakness.  He did feel like he was having some shortness of breath as well as noticing bilateral foot numbness especially the left foot going numb when sitting on the commode (although this is a chronic issue he has been noted to get worse lately).  He was having increased lethargy both legs were feeling weak and he felt like the neuropathy would improve with movement and worsen with sitting on hard surfaces such as the commode.  He was initially reluctant to come to the ED for evaluation but due to persistent symptoms his family convinced him to come  LKW: Several weeks prior to admission Thrombolytic given?: No, out of the window IA performed?: No, exam not consistent with LVO Premorbid modified rankin scale:      1 - No significant disability. Able to carry out all usual activities, despite some symptoms.  ROS: All other review of systems was negative except as noted in the HPI.   Past Medical History:  Diagnosis Date   Anemia    Asthma    Hx of childhood asthma, disappeared for a while, then resurfaced 6-7 years ago.    Cardiomyopathy    with a negative  cardiac catheterization in the past. 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    Past Surgical History:  Procedure Laterality Date   None     Current Outpatient Medications  Medication Instructions   albuterol (PROVENTIL) (2.5 MG/3ML) 0.083% nebulizer solution TAKE 3 MLS BY NEBULIZER EVERY 6 HOURS AS NEEDED FOR WHEEZING FOR SHORTNESS OF BREATH   albuterol (VENTOLIN HFA) 108 (90 Base) MCG/ACT inhaler 1-2 puffs, Inhalation, Every 6 hours PRN   amLODipine (NORVASC) 10 mg, Oral, Daily   aspirin 81 mg, Oral, Daily,     b complex vitamins tablet 1 tablet, Oral, Daily   fluticasone-salmeterol (ADVAIR) 500-50 MCG/ACT AEPB 1 puff, Inhalation, 2 times daily   furosemide (LASIX) 20 mg, Oral, Daily   leflunomide (ARAVA) 20 mg, Oral, Daily   metoprolol succinate (TOPROL-XL) 25 mg, Oral, Daily   montelukast (SINGULAIR) 10 mg, Oral, Daily at bedtime   predniSONE (DELTASONE) 10 MG tablet 4 tabs by mouth once daily for 2 days, then 3 tabs daily x 2 days, then 2 tabs daily x 2 days, then 1 tab daily x 2 days   sacubitril-valsartan (ENTRESTO) 97-103 MG 1 tablet, Oral, 2 times daily, (For medication assistance program)   spironolactone (ALDACTONE) 25 mg, Oral, Daily   tamsulosin (FLOMAX) 0.4 mg, Oral, Daily     Family History  Problem Relation Age of Onset   Cancer Father        multiple melanoma   Lupus Sister        died at 35   Diabetes Mellitus II Sister        died from covid-19   Asthma Daughter    Neuropathy Sister    Colon cancer Neg Hx    Esophageal cancer Neg Hx     Social History:  reports that he has quit smoking. He has never used smokeless tobacco. He reports current alcohol use. He reports that he does not use drugs.   Exam: Current vital signs: BP 135/89 (BP Location: Left Arm)   Pulse 75   Temp  98.2 F (36.8 C) (Oral)   Resp 20   Ht 6' (1.829 m)   Wt 105.2 kg   SpO2 97%   BMI 31.46 kg/m  Vital signs in last 24 hours: Temp:  [98.2 F (36.8 C)-99 F (37.2 C)] 98.2 F (36.8 C) (02/04 0429) Pulse Rate:  [39-76] 75 (02/04 0429) Resp:  [18-20] 20 (02/04 0429) BP: (118-139)/(73-101) 135/89 (02/04 0429) SpO2:  [94 %-98 %] 97 % (02/04 0429) Weight:  [105.2 kg] 105.2 kg (02/03 1955)   Physical Exam  Constitutional: Appears well-developed and well-nourished.  Psych: Affect appropriate to situation, pleasant and cooperative Eyes: No scleral injection HENT: No oropharyngeal obstruction.  MSK: no joint deformities.  Cardiovascular: Normal rate and regular rhythm.  Respiratory: Effort normal, non-labored breathing GI: Soft.  No distension. There is no tenderness.  Skin: Warm dry and intact visible skin  Neuro: Mental Status: Patient is awake, alert, oriented to person, place, month, year, and situation. Patient is able to give a clear and coherent history. No signs of aphasia or neglect Cranial Nerves: II: Visual Fields are full. Pupils are equal, round, and reactive to light.   III,IV, VI: At times he appears to have some left eye ptosis versus mild periorbital edema, but he reports his eye opening is limited at baseline bilaterally.  EOMI without diplopia V: Facial sensation is symmetric to light touch VII: Facial movement is notable for slight left nasolabial fold flattening, very subtle left droop when smiling VIII: hearing is intact to voice X: Uvula elevates symmetrically XI: Shoulder shrug is symmetric. XII: tongue is midline without atrophy or fasciculations.  Motor: Tone is normal. Bulk is normal. 5/5 strength was present in all four extremities, except for 4/5 left hip flexion which he initially reported was new but then noted is actually chronic for at least several months to years Sensory: Sensation is symmetric to light touch and temperature in the arms and  legs. Deep Tendon Reflexes: 2+ and symmetric in the brachioradialis and patellae.  Cerebellar: FNF and HKS are intact bilaterally Gait:  Deferred   NIHSS total 1 Score breakdown: 1 for left facial droop  Performed at 5:30 AM time of patient arrival to ED    I have reviewed labs in epic and the results pertinent to this consultation are:  Basic Metabolic Panel: Recent Labs  Lab 04/17/22 1959  NA 136  K 4.0  CL 106  CO2 25  GLUCOSE 98  BUN 13  CREATININE 1.28*  CALCIUM 9.1    CBC: Recent Labs  Lab 04/17/22 1959  WBC 5.7  HGB 12.7*  HCT 39.3  MCV 96.8  PLT 209    Coagulation Studies: No results for input(s): "LABPROT", "INR" in the last 72 hours.   Lab Results  Component Value Date  HGBA1C 5.5 11/03/2021   Lab Results  Component Value Date   CHOL 151 08/12/2020   HDL 42.30 08/12/2020   LDLCALC 98 08/12/2020   TRIG 51.0 08/12/2020   CHOLHDL 4 08/12/2020     I have reviewed the images obtained:  MRI brain personally reviewed, agree with radiology:   1. Focal area of signal abnormality in the ventral right thalamus likely a recent subacute infarct. No hemorrhage or mass effect.  [On my review there is also some involvement of the left thalamus but much less] 2. Findings of chronic microvascular ischemia and volume loss.  CT head personally reviewed, agree with radiology:   1. Vague hypodensity in the thalamus on the right, possible infarct of indeterminate age. MRI is recommended for further evaluation. 2. Chronic microvascular ischemic changes.  Impression: This is an 82 year old gentleman with past medical history as above presenting with nonspecific symptoms, none of which appear acute on detailed questioning.  However his imaging is concerning for acute to subacute process involving the right greater than left thalamus.  Infiltrative lesion would be on the differential.  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 - Stroke labs HgbA1c, fasting lipid panel - MRI brain with contrast - CTA head and neck - Frequent neuro checks - Echocardiogram - Continue home aspirin 81 mg daily, hold off on Plavix at this time given unclear time of symptom onset and majority of benefit of Plavix within the first 7 to 21 days - Risk factor modification - Telemetry monitoring; 30 day event monitor on discharge if no arrythmias captured  - Blood pressure goal   - Normotension given out of the permissive hypertensive window - PT consult, OT consult, Speech consult - Neurology to follow - Addendum: Additionally recommend CT chest abdomen pelvis for malignancy screening  Lesleigh Noe MD-PhD [facility] [phone] Available 7 PM to 7 AM, outside of these hours please call Neurologist on call as listed on Amion.   MRI brain personally reviewed, agree with radiology: Wispy enhancement in the right thalamic lesion which is nonspecific for differentiating subacute infarct from mass. The lesion is large for an infarct in this location and viewed with suspicion, recommend follow-up enhanced brain MRI in a few weeks.   Given these findings increasing concern for underlying malignancy  Additionally recommend CT chest abdomen pelvis for malignancy screening, continue to hold plavix due to potential need for biopsy

## 2022-04-18 NOTE — ED Notes (Signed)
ED TO INPATIENT HANDOFF REPORT  ED Nurse Name and Phone #: Estevan Oaks Name/Age/Gender Brandon [name] 82 y.o. male Room/Bed: 004C/004C  Code Status   Code Status: Full Code  Home/SNF/Other Home Patient oriented to: self, place, time, and situation Is this baseline? Yes   Triage Complete: Triage complete  Chief Complaint CVA (cerebral vascular accident) [facility]) [I63.9]  Triage Note Pt reports feeling weak, lack of energy and has been feeling dizzy, onset 3-4 days ago associated with mild nausea. He denies syncope, chest pain, emesis or diarrhea.   Allergies Allergies  Allergen Reactions   Ace Inhibitors Cough   Isosorb Dinitrate-Hydralazine Other (See Comments)    REACTION: dizziness/hypotensive    Level of Care/Admitting Diagnosis ED Disposition     ED Disposition  Admit   Condition  --   Comment  Hospital Area: [facility] [[id number]]  Level of Care: Telemetry Medical [[id number]]  May place patient in observation at [facility] or [facility] if equivalent level of care is available:: No  Covid Evaluation: Asymptomatic - no recent exposure (last 10 days) testing not required  Diagnosis: CVA (cerebral vascular accident) [facility]) [[id number]]  Admitting Physician: Clydie Braun [[id number]]  Attending Physician: Clydie Braun [[id number]]          B Medical/Surgery History Past Medical History:  Diagnosis Date   Anemia    Asthma    Hx of childhood asthma, disappeared for a while, then resurfaced 6-7 years ago.    Cardiomyopathy    with a negative cardiac catheterization in the past. 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    Past Surgical History:  Procedure Laterality Date   None       A IV Location/Drains/Wounds Patient Lines/Drains/Airways Status     Active  Line/Drains/Airways     Name Placement date Placement time Site Days   Peripheral IV 04/17/22 20 G Anterior;Right Antecubital 04/17/22  2223  Antecubital  1            Intake/Output Last 24 hours No intake or output data in the 24 hours ending 04/18/22 1117  Labs/Imaging Results for orders placed or performed during the hospital encounter of 04/17/22 (from the past 48 hour(s))  Basic metabolic panel     Status: Abnormal   Collection Time: 04/17/22  7:59 PM  Result Value Ref Range   Sodium 136 135 - 145 mmol/L   Potassium 4.0 3.5 - 5.1 mmol/L   Chloride 106 98 - 111 mmol/L   CO2 25 22 - 32 mmol/L   Glucose, Bld 98 70 - 99 mg/dL    Comment: Glucose reference range applies only to samples taken after fasting for at least 8 hours.   BUN 13 8 - 23 mg/dL   Creatinine, Ser 9.52 (H) 0.61 - 1.24 mg/dL   Calcium 9.1 8.9 - 84.1 mg/dL   GFR, Estimated 56 (L) >60 mL/min    Comment: (NOTE) Calculated using the CKD-EPI Creatinine Equation (2021)    Anion gap 5 5 - 15    Comment: Performed at [facility], [address] [place], [place] [address]  CBC     Status: Abnormal   Collection Time: 04/17/22  7:59 PM  Result Value Ref Range  WBC 5.7 4.0 - 10.5 K/uL   RBC 4.06 (L) 4.22 - 5.81 MIL/uL   Hemoglobin 12.7 (L) 13.0 - 17.0 g/dL   HCT 91.6 38.4 - 66.5 %   MCV 96.8 80.0 - 100.0 fL   MCH 31.3 26.0 - 34.0 pg   MCHC 32.3 30.0 - 36.0 g/dL   RDW 99.3 57.0 - 17.7 %   Platelets 209 150 - 400 K/uL   nRBC 0.0 0.0 - 0.2 %    Comment: Performed at [facility], [address] [place], [place] [address]  Urinalysis, Routine w reflex microscopic -Urine, Clean Catch     Status: Abnormal   Collection Time: 04/17/22  7:59 PM  Result Value Ref Range   Color, Urine YELLOW YELLOW   APPearance CLEAR CLEAR   Specific Gravity, Urine 1.025 1.005 - 1.030   pH 5.5 5.0 - 8.0   Glucose, UA NEGATIVE NEGATIVE mg/dL   Hgb urine dipstick NEGATIVE NEGATIVE   Bilirubin Urine SMALL (A)  NEGATIVE   Ketones, ur NEGATIVE NEGATIVE mg/dL   Protein, ur 30 (A) NEGATIVE mg/dL   Nitrite NEGATIVE NEGATIVE   Leukocytes,Ua NEGATIVE NEGATIVE    Comment: Performed at [facility], [address] [place], [place] [address]  Urinalysis, Microscopic (reflex)     Status: Abnormal   Collection Time: 04/17/22  7:59 PM  Result Value Ref Range   RBC / HPF 0-5 0 - 5 RBC/hpf   WBC, UA 0-5 0 - 5 WBC/hpf   Bacteria, UA FEW (A) NONE SEEN   Squamous Epithelial / HPF 0-5 0 - 5 /HPF    Comment: Performed at [facility], [address] [place], [place] [address]  CBG monitoring, ED     Status: None   Collection Time: 04/17/22  8:03 PM  Result Value Ref Range   Glucose-Capillary 84 70 - 99 mg/dL    Comment: Glucose reference range applies only to samples taken after fasting for at least 8 hours.  Resp panel by RT-PCR (RSV, Flu A&B, Covid) Anterior Nasal Swab     Status: None   Collection Time: 04/17/22  8:26 PM   Specimen: Anterior Nasal Swab  Result Value Ref Range   SARS Coronavirus 2 by RT PCR NEGATIVE NEGATIVE    Comment: (NOTE) SARS-CoV-2 target nucleic acids are NOT DETECTED.  The SARS-CoV-2 RNA is generally detectable in upper respiratory specimens during the acute phase of infection. The lowest concentration of SARS-CoV-2 viral copies this assay can detect is 138 copies/mL. A negative result does not preclude SARS-Cov-2 infection and should not be used as the sole basis for treatment or other patient management decisions. A negative result may occur with  improper specimen collection/handling, submission of specimen other than nasopharyngeal swab, presence of viral mutation(s) within the areas targeted by this assay, and inadequate number of viral copies(<138 copies/mL). A negative result must be combined with clinical observations, patient history, and epidemiological information. The expected result is Negative.  Fact Sheet for Patients:   BloggerCourse.com  Fact Sheet for Healthcare Providers:  SeriousBroker.it  This test is no t yet approved or cleared by the [place] FDA and  has been authorized for detection and/or diagnosis of SARS-CoV-2 by FDA under an Emergency Use Authorization (EUA). This EUA will remain  in effect (meaning this test can be used) for the duration of the COVID-19 declaration under Section 564(b)(1) of the Act, 21 U.S.C.section 360bbb-3(b)(1), unless the authorization is terminated  or revoked sooner.       Influenza A by PCR NEGATIVE NEGATIVE   Influenza B by PCR NEGATIVE NEGATIVE    Comment: (NOTE) The Xpert Xpress SARS-CoV-2/FLU/RSV plus assay is intended as an aid in the diagnosis of influenza from Nasopharyngeal swab specimens and should not be used as a sole basis for treatment. Nasal washings and aspirates are unacceptable for Xpert Xpress SARS-CoV-2/FLU/RSV testing.  Fact Sheet for Patients: EntrepreneurPulse.com.au  Fact Sheet for Healthcare Providers: IncredibleEmployment.be  This test is not yet approved or cleared by the [place] FDA and has been authorized for detection and/or diagnosis of SARS-CoV-2 by FDA under an Emergency Use Authorization (EUA). This EUA will remain in effect (meaning this test can be used) for the duration of the COVID-19 declaration under Section 564(b)(1) of the Act, 21 U.S.C. section 360bbb-3(b)(1), unless the authorization is terminated or revoked.     Resp Syncytial Virus by PCR NEGATIVE NEGATIVE    Comment: (NOTE) Fact Sheet for Patients: EntrepreneurPulse.com.au  Fact Sheet for Healthcare Providers: IncredibleEmployment.be  This test is not yet approved or cleared by the [place] FDA and has been authorized for detection and/or diagnosis of SARS-CoV-2 by FDA under an Emergency Use Authorization (EUA).  This EUA will remain in effect (meaning this test can be used) for the duration of the COVID-19 declaration under Section 564(b)(1) of the Act, 21 U.S.C. section 360bbb-3(b)(1), unless the authorization is terminated or revoked.  Performed at [facility], [address] [place], [place] [address]    CT CHEST ABDOMEN PELVIS W CONTRAST  Result Date: 04/18/2022 CLINICAL DATA:  Metastatic disease evaluation, brain lesion * Tracking Code: BO * EXAM: CT CHEST, ABDOMEN, AND PELVIS WITH CONTRAST TECHNIQUE: Multidetector CT imaging of the chest, abdomen and pelvis was performed following the standard protocol during bolus administration of intravenous contrast. RADIATION DOSE REDUCTION: This exam was performed according to the departmental dose-optimization program which includes automated exposure control, adjustment of the mA and/or kV according to patient size and/or use of iterative reconstruction technique. CONTRAST:  21mL OMNIPAQUE IOHEXOL 350 MG/ML SOLN COMPARISON:  CT chest angiogram, 03/25/2022 FINDINGS: CT CHEST FINDINGS Cardiovascular: Aortic atherosclerosis. Normal heart size. Left coronary artery calcifications. No pericardial effusion. Mediastinum/Nodes: No enlarged mediastinal, hilar, or axillary lymph nodes. Thyroid gland, trachea, and esophagus demonstrate no significant findings. Lungs/Pleura: Criss Rosales appearing dependent bibasilar scarring and or atelectasis. No pleural effusion or pneumothorax. Musculoskeletal: No chest wall abnormality. No acute osseous findings. CT ABDOMEN PELVIS FINDINGS Hepatobiliary: No solid liver abnormality is seen. No gallstones, gallbladder wall thickening, or biliary dilatation. Pancreas: Unremarkable. No pancreatic ductal dilatation or surrounding inflammatory changes. Spleen: Normal in size without significant abnormality. Adrenals/Urinary Tract: Small intermediate attenuation adrenal nodules, measuring 2.0 x 1.5 cm in the right adrenal body (series 3,  image 49) and 1.4 x 1.0 cm in the medial limb of the left adrenal gland (series 3, image 54). Simple, benign bilateral renal cortical cysts, for which no further follow-up or characterization is required. Punctuate nonobstructive calculus of the inferior pole of the right kidney (series 6, image 97). Evaluation for additional calculi is generally limited by the presence of excreted contrast in the renal collecting systems. Lobulated, multifocal bilateral renal cortical scarring. No hydronephrosis. Mild diffuse urinary bladder wall thickening, likely secondary to chronic outlet obstruction. Stomach/Bowel: Stomach is within normal limits. Appendix appears normal. No evidence of bowel wall thickening, distention, or inflammatory changes. Descending and sigmoid diverticulosis. Vascular/Lymphatic: Severe mixed aortic atherosclerosis. No enlarged abdominal  or pelvic lymph nodes. Reproductive: Prostatomegaly. Other: No abdominal wall hernia or abnormality. No ascites. Musculoskeletal: No acute osseous findings. IMPRESSION: 1. No definite evidence of primary malignancy or metastatic disease in the chest, abdomen, or pelvis. 2. Small intermediate attenuation adrenal nodules, measuring 2.0 x 1.5 cm in the right adrenal body and 1.4 x 1.0 cm in the medial limb of the left adrenal gland. 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 These could be further characterized by adrenal protocol CT or MRI if clinically appropriate. 3. Punctuate nonobstructive calculus of the inferior pole of the right kidney. Evaluation for additional calculi is generally limited by the presence of excreted contrast in the renal collecting systems. No hydronephrosis. 4. Lobulated, multifocal bilateral renal cortical scarring, consistent with infectious, obstructive, or ischemic insult 5. Prostatomegaly with mild diffuse urinary bladder wall thickening, likely  secondary to chronic outlet obstruction. 6. Coronary artery disease. Aortic Atherosclerosis (ICD10-I70.0). Electronically Signed   By: Jearld Lesch M.D.   On: 04/18/2022 10:00   CT ANGIO HEAD NECK W WO CM  Result Date: 04/18/2022 CLINICAL DATA:  Abnormal right thalamus. EXAM: CT ANGIOGRAPHY HEAD AND NECK TECHNIQUE: Multidetector CT imaging of the head and neck was performed using the standard protocol during bolus administration of intravenous contrast. Multiplanar CT image reconstructions and MIPs were obtained to evaluate the vascular anatomy. Carotid stenosis measurements (when applicable) are obtained utilizing NASCET criteria, using the distal internal carotid diameter as the denominator. RADIATION DOSE REDUCTION: This exam was performed according to the departmental dose-optimization program which includes automated exposure control, adjustment of the mA and/or kV according to patient size and/or use of iterative reconstruction technique. CONTRAST:  38mL OMNIPAQUE IOHEXOL 350 MG/ML SOLN COMPARISON:  Brain MRI from earlier today FINDINGS: CTA NECK FINDINGS Aortic arch: Atheromatous plaque with 3 vessel branching. Right carotid system: Vessels are smoothly contoured and widely patent. No atheromatous changes Left carotid system: Vessels are smoothly contoured and widely patent. No atheromatous changes Vertebral arteries: Negative subclavian and vertebral arteries. 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 Upper chest: Clear apical lungs Review of the MIP images confirms the above findings CTA HEAD FINDINGS Anterior circulation: No significant stenosis, proximal occlusion, aneurysm, or vascular malformation. Mild atheromatous plaque at the carotid siphons. Posterior circulation: No significant stenosis, proximal occlusion, aneurysm, or vascular malformation. No notable finding at the P1 segments. Venous sinuses: Unremarkable Anatomic variants: None significant  Review of the MIP images confirms the above findings IMPRESSION: No emergent finding. Less than typical atheromatous changes for age with no stenosis or irregularity of major arteries in the head and neck. Electronically Signed   By: Tiburcio Pea M.D.   On: 04/18/2022 06:58   MR BRAIN W CONTRAST  Result Date: 04/18/2022 CLINICAL DATA:  Acute neurologic deficit with stroke suspected. Rule out malignancy. EXAM: MRI HEAD WITH CONTRAST TECHNIQUE: Multiplanar, multiecho pulse sequences of the brain and surrounding structures were obtained with intravenous contrast. CONTRAST:  2mL GADAVIST GADOBUTROL 1 MMOL/ML IV SOLN COMPARISON:  Brain MRI from earlier today FINDINGS: Brain: The T2 hyperintensity in the anterior right thalamus has wispy internal enhancement. Subacute infarcts enhance although a less reliable finding in the subcortical brain. No second lesion is seen. No hydrocephalus or collection. Vascular: Normal vascular enhancements Skull and upper cervical spine: No focal marrow lesion Sinuses/Orbits: Negative IMPRESSION: Wispy enhancement in the right thalamic lesion which is nonspecific for differentiating subacute infarct from mass.  The lesion is large for an infarct in this location and viewed with suspicion, recommend follow-up enhanced brain MRI in a few weeks. Electronically Signed   By: Jorje Guild M.D.   On: 04/18/2022 06:38   MR Brain Wo Contrast (neuro protocol)  Result Date: 04/18/2022 CLINICAL DATA:  Acute neurologic deficit EXAM: MRI HEAD WITHOUT CONTRAST TECHNIQUE: Multiplanar, multiecho pulse sequences of the brain and surrounding structures were obtained without intravenous contrast. COMPARISON:  None Available. FINDINGS: Brain: Focal area of signal abnormality in the ventral right thalamus likely a recent subacute infarct. Chronic microhemorrhage of the right cerebellum. There is multifocal hyperintense T2-weighted signal within the white matter. Generalized volume loss. The midline  structures are normal. Vascular: Normal flow voids. Skull and upper cervical spine: Normal marrow signal. Sinuses/Orbits: Negative. Other: None. IMPRESSION: 1. Focal area of signal abnormality in the ventral right thalamus likely a recent subacute infarct. No hemorrhage or mass effect. 2. Findings of chronic microvascular ischemia and volume loss. Electronically Signed   By: Ulyses Jarred M.D.   On: 04/18/2022 03:49   CT Head Wo Contrast  Addendum Date: 04/17/2022   ADDENDUM REPORT: 04/17/2022 21:03 ADDENDUM: Critical Value/emergent results were called by telephone at the time of interpretation on 04/17/2022 at 9:03 pm to provider Fredia Sorrow , who verbally acknowledged these results. Electronically Signed   By: Brett Fairy M.D.   On: 04/17/2022 21:03   Result Date: 04/17/2022 CLINICAL DATA:  Neuro deficit, acute, stroke suspected. Weakness, lack of injury, dizziness. EXAM: CT HEAD WITHOUT CONTRAST TECHNIQUE: Contiguous axial images were obtained from the base of the skull through the vertex without intravenous contrast. RADIATION DOSE REDUCTION: This exam was performed according to the departmental dose-optimization program which includes automated exposure control, adjustment of the mA and/or kV according to patient size and/or use of iterative reconstruction technique. COMPARISON:  None Available. FINDINGS: Brain: No acute intracranial hemorrhage, midline shift or mass effect. No extra-axial fluid collection. Scattered periventricular white matter hypodensities are present bilaterally. No hydrocephalus. Calcification is noted in the basal ganglia on the right. There is a vague hypodense region in the thalamus on the right which is indeterminate in age. Vascular: No hyperdense vessel or unexpected calcification. Skull: Normal. Negative for fracture or focal lesion. Sinuses/Orbits: Round density is noted in the ethmoid air cells on the right, possible mucosal retention cyst versus polyp. No acute orbital  abnormality. 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 IMPRESSION: 1. Vague hypodensity in the thalamus on the right, possible infarct of indeterminate age. MRI is recommended for further evaluation. 2. Chronic microvascular ischemic changes. Electronically Signed: By: Brett Fairy M.D. On: 04/17/2022 20:58   DG Chest Port 1 View  Result Date: 04/17/2022 CLINICAL DATA:  Weakness.  History of congestive heart failure. EXAM: PORTABLE CHEST 1 VIEW COMPARISON:  January 11 current right 4, chest CT FINDINGS: Tortuosity of the thoracic aorta. Cardiomediastinal silhouette is normal. Mediastinal contours appear intact. There is no evidence of focal airspace consolidation, pleural effusion or pneumothorax. Osseous structures are without acute abnormality. Soft tissues are grossly normal. IMPRESSION: No active disease. Electronically Signed   By: Fidela Salisbury M.D.   On: 04/17/2022 20:57    Pending Labs Unresulted Labs (From admission, onward)     Start     Ordered   04/18/22 0909  Hemoglobin A1c  Once,   R        04/18/22 0908   04/18/22 0909  Lipid panel  Once,   R        04/18/22 0908   04/18/22 0749  Vitamin B1  Once,   R        04/18/22 0748   04/18/22 0744  TSH  Once,   R        04/18/22 0744            Vitals/Pain Today's Vitals   04/18/22 0429 04/18/22 0813 04/18/22 0900 04/18/22 1100  BP: 135/89  (!) 129/57 131/84  Pulse: 75  (!) 56   Resp: 20  12 (!) 26  Temp: 98.2 F (36.8 C) 98.1 F (36.7 C)    TempSrc: Oral Oral    SpO2: 97%  97%   Weight:      Height:      PainSc:        Isolation Precautions No active isolations  Medications Medications  tamsulosin (FLOMAX) capsule 0.4 mg (0.4 mg Oral Given 04/18/22 1000)  albuterol (PROVENTIL) (2.5 MG/3ML) 0.083% nebulizer solution 2.5 mg (has no administration in time range)  mometasone-formoterol (DULERA) 200-5 MCG/ACT inhaler 2 puff (2 puffs Inhalation Not Given 04/18/22 1001)  montelukast  (SINGULAIR) tablet 10 mg (has no administration in time range)   stroke: early stages of recovery book (has no administration in time range)  acetaminophen (TYLENOL) tablet 650 mg (has no administration in time range)    Or  acetaminophen (TYLENOL) 160 MG/5ML solution 650 mg (has no administration in time range)    Or  acetaminophen (TYLENOL) suppository 650 mg (has no administration in time range)  senna-docusate (Senokot-S) tablet 1 tablet (has no administration in time range)  enoxaparin (LOVENOX) injection 40 mg (40 mg Subcutaneous Given 04/18/22 1000)  aspirin EC tablet 81 mg (81 mg Oral Given 04/18/22 1000)  LORazepam (ATIVAN) injection 1 mg (1 mg Intravenous Given 04/18/22 0308)  LORazepam (ATIVAN) injection 0.25 mg (0.25 mg Intravenous Given 04/18/22 0554)  gadobutrol (GADAVIST) 1 MMOL/ML injection 10 mL (10 mLs Intravenous Contrast Given 04/18/22 0611)  iohexol (OMNIPAQUE) 350 MG/ML injection 75 mL (75 mLs Intravenous Contrast Given 04/18/22 0641)  iohexol (OMNIPAQUE) 350 MG/ML injection 80 mL (80 mLs Intravenous Contrast Given 04/18/22 0945)    Mobility walks     Focused Assessments Neuro Assessment Handoff:  Swallow screen pass? Yes    NIH Stroke Scale  Dizziness Present: No Headache Present: No Interval: Shift assessment Level of Consciousness (1a.)   : Alert, keenly responsive LOC Questions (1b. )   : Answers both questions correctly LOC Commands (1c. )   : Performs both tasks correctly Best Gaze (2. )  : Normal Visual (3. )  : No visual loss Facial Palsy (4. )    : Normal symmetrical movements Motor Arm, Left (5a. )   : No drift Motor Arm, Right (5b. ) : No drift Motor Leg, Left (6a. )  : No drift Motor Leg, Right (6b. ) : No drift Limb Ataxia (7. ): Absent Sensory (8. )  : Normal, no sensory loss Best Language (9. )  : No aphasia Dysarthria (10. ): Normal Extinction/Inattention (11.)   : No Abnormality Complete NIHSS TOTAL: 0     Neuro Assessment: Within Defined  Limits Neuro Checks:   Shift assessment (04/17/22 2223)  Has TPA been given? No If patient is a Neuro Trauma and patient is going to OR before floor call report to [place] nurse: [phone] or [phone]   R Recommendations: See Admitting Provider Note  Report given to:   Additional  Notes: Patient AOX4, passed swallow screen, continent, feeds himself, he wasn't impressed with breakfast, he's quiet but funny, cold natured, walked down the hallway unassisted, echo is complete, neuro NP is in there now, hospitalist has seen him

## 2022-04-18 NOTE — ED Notes (Signed)
Patient awake, alert and oriented x4, no s/s of distress, ambulated to the restroom, steady gait noted, denies any new concerns, states he feels better than what he did at his arrival, will continue to monitor.

## 2022-04-18 NOTE — Evaluation (Signed)
Occupational Therapy Evaluation Patient Details Name: Brandon [name] MRN: [id number] DOB: 1940/09/02 Today's Date: 04/18/2022   History of Present Illness Presented to ED 04/17/22 due to increased lethargy, dizziness, and weakness in both legs for an unknown number of days.  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 +bradycardia; PMH-significant of hypertension, HFrEF, asthma, CKD stage IIIa, anemia, OSA, and obesity   Clinical Impression   PTA, pt was independent and living with his wife. Upon eval, pt performing ADL with up to supervision for safety due to consistent reports of dizziness in standing. Pt donning pants leaning against bathroom wall, brushing teeth standing at sink, and retrieving item from floor with supervision. Pt performing head turns and eye shifts in standing and reports dizziness does not change but always present in standing. Passing Short Blessed Test with score of 2. Complains of blurred vision with dizziness. Very slight decrease in superior field of vision, and min decreased visual attention during tracking. Pt presents with changes in vision, coordination, and balance. Will continue acutely, but do not anticipate any post-acute OT needs.      Recommendations for follow up therapy are one component of a multi-disciplinary discharge planning process, led by the attending physician.  Recommendations may be updated based on patient status, additional functional criteria and insurance authorization.   Follow Up Recommendations  No OT follow up     Assistance Recommended at Discharge Set up Supervision/Assistance  Patient can return home with the following A little help with bathing/dressing/bathroom;Assistance with cooking/housework;Help with stairs or ramp for entrance    Functional Status Assessment  Patient has had a recent decline in their functional status and demonstrates the ability to make significant improvements in  function in a reasonable and predictable amount of time.  Equipment Recommendations  None recommended by OT    Recommendations for Other Services       Precautions / Restrictions Precautions Precautions: Fall      Mobility Bed Mobility Overal bed mobility: Modified Independent             General bed mobility comments: slight incr time and effort    Transfers Overall transfer level: Needs assistance Equipment used: None Transfers: Sit to/from Stand Sit to Stand: Min guard           General transfer comment: guarding due to reports of dizziness and initial decr in BP. Pt also able to retrieve item off floor with no assist      Balance Overall balance assessment: Needs assistance Sitting-balance support: No upper extremity supported, Feet supported Sitting balance-Leahy Scale: Good     Standing balance support: No upper extremity supported Standing balance-Leahy Scale: Fair                             ADL either performed or assessed with clinical judgement   ADL Overall ADL's : Needs assistance/impaired Eating/Feeding: Independent   Grooming: Supervision/safety;Standing;Oral care Grooming Details (indicate cue type and reason): for safety due to pt dizzy. Pt brushing teeth at sink. Min overshooting observed when applying toothpaste but not observed in orther areas Upper Body Bathing: Supervision/ safety;Standing   Lower Body Bathing: Supervison/ safety;Sit to/from stand   Upper Body Dressing : Supervision/safety;Standing   Lower Body Dressing: Min guard;Sit to/from stand Lower Body Dressing Details (indicate cue type and reason): donning pants while in restroom leaning backward with back on wall Toilet Transfer: Supervision/safety;Ambulation Toilet Transfer Details (indicate  cue type and reason): supervision due to reports of dizziness Toileting- Clothing Manipulation and Hygiene: Modified independent   Tub/ Shower Transfer: Walk-in  shower;Supervision/safety;Ambulation   Functional mobility during ADLs: Supervision/safety General ADL Comments: supervision in standing only due to reports of being dizzy. Attempted to exacerbate dizziness with head turns, quick movements, eye movement, etc, but pt reporting dizziness stayed the same once upright.     Vision Baseline Vision/History: 1 Wears glasses Ability to See in Adequate Light: 0 Adequate Patient Visual Report: Blurring of vision Vision Assessment?: Yes Eye Alignment: Within Functional Limits Ocular Range of Motion: Within Functional Limits Alignment/Gaze Preference: Within Defined Limits Tracking/Visual Pursuits:  (on first attempt, decreased smooothness of tracking in the R superior field, but no difficulty on second attempt) Saccades: Other (comment) (increased time to locate stimulus) Visual Fields: Other (comment) (superior fields seemed to be more difficult, but nearly [facility]) Depth Perception: Undershoots (minor overshooting when applying toothpaste)     Perception     Praxis      Pertinent Vitals/Pain Pain Assessment Pain Assessment: No/denies pain     Hand Dominance Right   Extremity/Trunk Assessment Upper Extremity Assessment Upper Extremity Assessment: Generalized weakness;RUE deficits/detail;LUE deficits/detail RUE Deficits / Details: mildly decr coordination LUE Deficits / Details: mildly decr coordination   Lower Extremity Assessment Lower Extremity Assessment: Defer to PT evaluation   Cervical / Trunk Assessment Cervical / Trunk Assessment: Normal   Communication Communication Communication: No difficulties   Cognition Arousal/Alertness: Awake/alert Behavior During Therapy: Flat affect Overall Cognitive Status: Within Functional Limits for tasks assessed                                 General Comments: a&ox4, following all commands. Passing short blessed test with score of 2 with min difficulty with delayed memory  recall.     General Comments  wife and daughter present    Exercises     Shoulder Instructions      Home Living Family/patient expects to be discharged to:: Private residence Living Arrangements: Spouse/significant other;Other relatives Advertising account executive (in college)) Available Help at Discharge: Family;Available 24 hours/day Type of Home: House Home Access: Level entry     Home Layout: Two level;Bed/bath upstairs Alternate Level Stairs-Number of Steps: flight Alternate Level Stairs-Rails: Right Bathroom Shower/Tub: Walk-in shower;Tub only (has walk in and jacuzzi/spa tub. Has been using walk in, but likes to get in spa tub when he can)   Bathroom Toilet: Standard Bathroom Accessibility: Yes   Home Equipment: Shower seat - built in;Cane - single point          Prior Functioning/Environment Prior Level of Function : Independent/Modified Independent             Mobility Comments: very seldom uses cane in community ADLs Comments: independent. Has handy person for yard work, heavy lifting, etc.        OT Problem List: Decreased strength;Decreased activity tolerance;Impaired balance (sitting and/or standing);Impaired vision/perception;Decreased coordination      OT Treatment/Interventions: Self-care/ADL training;Therapeutic exercise;DME and/or AE instruction;Balance training;Visual/perceptual remediation/compensation;Patient/family education    OT Goals(Current goals can be found in the care plan section) Acute Rehab OT Goals Patient Stated Goal: feel better OT Goal Formulation: With patient/family Time For Goal Achievement: 05/02/22 Potential to Achieve Goals: Good  OT Frequency: Min 2X/week    Co-evaluation              AM-PAC OT "6 Clicks" Daily Activity  Outcome Measure Help from another person eating meals?: None Help from another person taking care of personal grooming?: A Little Help from another person toileting, which includes using toliet,  bedpan, or urinal?: A Little Help from another person bathing (including washing, rinsing, drying)?: A Little Help from another person to put on and taking off regular upper body clothing?: A Little Help from another person to put on and taking off regular lower body clothing?: A Little 6 Click Score: 19   End of Session Equipment Utilized During Treatment: Gait belt Nurse Communication: Mobility status  Activity Tolerance: Patient tolerated treatment well Patient left: in bed;with call bell/phone within reach;with bed alarm set  OT Visit Diagnosis: Unsteadiness on feet (R26.81);Muscle weakness (generalized) (M62.81);Low vision, both eyes (H54.2)                Time: 1610-1640 OT Time Calculation (min): 30 min Charges:  OT General Charges $OT Visit: 1 Visit OT Evaluation $OT Eval Moderate Complexity: 1 Mod OT Treatments $Self Care/Home Management : 8-22 mins  Elder Cyphers, OTR/L [facility] Acute Rehabilitation Office: [phone]   Magnus Ivan 04/18/2022, 5:21 PM

## 2022-04-18 NOTE — ED Notes (Signed)
Patient transported to MRI 

## 2022-04-18 NOTE — ED Notes (Signed)
Patient sitting on the side of the bed eating breakfast, denies any needs at this time.

## 2022-04-18 NOTE — Progress Notes (Deleted)
Cardiology Office Note   Date:  04/18/2022   ID:  Brandon [name], DOB 02-11-1941, MRN [id number]  PCP:  Debbrah Alar, NP  Cardiologist:   Minus Breeding, MD   No chief complaint on file.    History of Present Illness: Brandon [name] is a 82 y.o. male who presents for follow up of cardiomyopathy with an EF in the past of 45 - 50%.  ***   *** At the last visit I did switch him to [facility] when he did well with this.  He had follow-up of his chemistry and this was fine.  He does have some mild renal insufficiency.  He has done well since I last saw him.  He was able to do some golfing a couple of times this summer 2018 holes.  This is good for him.  He has not had any new shortness of breath, PND or orthopnea.  He had no new palpitations, presyncope or syncope.  He has had no weight gain or edema.   Past Medical History:  Diagnosis Date   Anemia    Asthma    Hx of childhood asthma, disappeared for a while, then resurfaced 6-7 years ago.    Cardiomyopathy    with a negative cardiac catheterization in the past. 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     Past Surgical History:  Procedure Laterality Date   None       No current facility-administered medications for this visit.   No current outpatient medications on file.   Facility-Administered Medications Ordered in Other Visits  Medication Dose Route Frequency Provider Last Rate Last Admin   [START ON 04/19/2022]  stroke: early stages of recovery book   Does not apply Once Norval Morton, MD       acetaminophen (TYLENOL) tablet 650 mg  650 mg Oral Q4H PRN Norval Morton, MD       Or   acetaminophen (TYLENOL) 160 MG/5ML solution 650 mg  650 mg Per Tube Q4H PRN Norval Morton, MD       Or   acetaminophen (TYLENOL)  suppository 650 mg  650 mg Rectal Q4H PRN Fuller Plan A, MD       albuterol (PROVENTIL) (2.5 MG/3ML) 0.083% nebulizer solution 2.5 mg  2.5 mg Nebulization Q6H PRN Tamala Julian, Rondell A, MD       amLODipine (NORVASC) tablet 10 mg  10 mg Oral Daily Smith, Rondell A, MD   10 mg at 04/18/22 1408   aspirin EC tablet 81 mg  81 mg Oral Daily Ventura Sellers, RPH   81 mg at 04/18/22 1000   enoxaparin (LOVENOX) injection 40 mg  40 mg Subcutaneous Q24H Smith, Rondell A, MD   40 mg at 04/18/22 1000   leflunomide (ARAVA) tablet 20 mg  20 mg Oral Daily Smith, Rondell A, MD   20 mg at 04/18/22 1408   metoprolol succinate (TOPROL-XL) 24 hr tablet 25 mg  25 mg Oral Daily Smith, Rondell A, MD   25 mg at 04/18/22 1408   mometasone-formoterol (DULERA) 200-5 MCG/ACT inhaler 2 puff  2 puff Inhalation BID Smith, Rondell A, MD       montelukast (SINGULAIR) tablet 10 mg  10 mg Oral  QHS Fuller Plan A, MD   10 mg at 04/18/22 2049   ondansetron (ZOFRAN) injection 4 mg  4 mg Intravenous Q6H PRN Fuller Plan A, MD       rosuvastatin (CRESTOR) tablet 10 mg  10 mg Oral Daily Lovey Newcomer C, NP       senna-docusate (Senokot-S) tablet 1 tablet  1 tablet Oral QHS PRN Fuller Plan A, MD       tamsulosin (FLOMAX) capsule 0.4 mg  0.4 mg Oral Daily Smith, Rondell A, MD   0.4 mg at 04/18/22 1000    Allergies:   Ace inhibitors and Isosorb dinitrate-hydralazine    ROS:  Please see the history of present illness.   Otherwise, review of systems are positive for ***.   All other systems are reviewed and negative.    PHYSICAL EXAM: VS:  There were no vitals taken for this visit. , BMI There is no height or weight on file to calculate BMI. GENERAL:  Well appearing NECK:  No jugular venous distention, waveform within normal limits, carotid upstroke brisk and symmetric, no bruits, no thyromegaly LUNGS:  Clear to auscultation bilaterally CHEST:  Unremarkable HEART:  PMI not displaced or sustained,S1 and S2 within normal limits, no  S3, no S4, no clicks, no rubs, *** murmurs ABD:  Flat, positive bowel sounds normal in frequency in pitch, no bruits, no rebound, no guarding, no midline pulsatile mass, no hepatomegaly, no splenomegaly EXT:  2 plus pulses throughout, no edema, no cyanosis no clubbing    ***GENERAL:  Well appearing NECK:  No jugular venous distention, waveform within normal limits, carotid upstroke brisk and symmetric, no bruits, no thyromegaly LUNGS:  Clear to auscultation bilaterally CHEST:  Unremarkable HEART:  PMI not displaced or sustained,S1 and S2 within normal limits, no S3, no S4, no clicks, no rubs, no murmurs ABD:  Flat, positive bowel sounds normal in frequency in pitch, no bruits, no rebound, no guarding, no midline pulsatile mass, no hepatomegaly, no splenomegaly EXT:  2 plus pulses throughout, no edema, no cyanosis no clubbing    EKG:  EKG *** ordered today. Sinus rhythm, rate ***, premature ventricular contractions, right bundle branch block, no acute ST-T wave changes.    Recent Labs: 03/22/2022: ALT 10; Pro B Natriuretic peptide (BNP) 1,464.0 04/17/2022: BUN 13; Creatinine, Ser 1.28; Hemoglobin 12.7; Platelets 209; Potassium 4.0; Sodium 136 04/18/2022: TSH 0.862    Lipid Panel    Component Value Date/Time   CHOL 188 04/18/2022 1254   TRIG 59 04/18/2022 1254   HDL 51 04/18/2022 1254   CHOLHDL 3.7 04/18/2022 1254   VLDL 12 04/18/2022 1254   LDLCALC 125 (H) 04/18/2022 1254      Wt Readings from Last 3 Encounters:  04/17/22 232 lb (105.2 kg)  03/30/22 236 lb (107 kg)  03/22/22 230 lb (104.3 kg)      Other studies Reviewed: Additional studies/ records that were reviewed today include: ***. Review of the above records demonstrates:  Please see elsewhere in the note.     ASSESSMENT AND PLAN:  Leg edema:   ***  This is improved from previous and he uses the Lasix only as needed.  Chronic systolic heart failure:   ***  I am going to increase his Entresto to 97/103 twice  daily.   Nonischemic cardiomyopathy:   ***  This is being managed as above  PVCs:   ***   He does not feel these.  No change in therapy.  Hypertension:  This is ***  being managed in the context of treating his CHF   Obstructive sleep apnea:   He uses CPAP.  *** He just got a new mask.     CKD stage III:  Creat is *** This has been stable and actually his creatinine was down from previous from last check.  It was 1.31.     Current medicines are reviewed at length with the patient today.  The patient does not have concerns regarding medicines.  The following changes have been made:  ***  Labs/ tests ordered today include:  ***  No orders of the defined types were placed in this encounter.   Disposition:   FU with me in *** months.    Signed, Minus Breeding, MD  04/18/2022 9:02 PM    Mariano Colon

## 2022-04-18 NOTE — Evaluation (Signed)
Physical Therapy Evaluation Patient Details Name: Brandon [name] MRN: [id number] DOB: 1940-07-31 Today's Date: 04/18/2022  History of Present Illness  Presented to ED 04/17/22 due to increased lethargy, dizziness, and weakness in both legs for an unknown number of days.  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 +bradycardia; PMH-significant of hypertension, HFrEF, asthma, CKD stage IIIa, anemia, OSA, and obesity  Clinical Impression   Pt admitted secondary to problem above with deficits below. PTA patient was independent with ambulation and ADLs. In the past 3-5 days, he has been experiencing dizziness with activity (describes as lightheadedness). Orthostatic vital signs assessed and were negative (see vitals flowsheet).  Pt currently requires minguard assist for safety due to feeling overall weak and lightheaded.  Anticipate patient may benefit from PT to address problems listed below. Will continue to follow acutely to maximize functional mobility independence and safety and determine if any DME indicated.          Recommendations for follow up therapy are one component of a multi-disciplinary discharge planning process, led by the attending physician.  Recommendations may be updated based on patient status, additional functional criteria and insurance authorization.  Follow Up Recommendations Outpatient PT      Assistance Recommended at Discharge Set up Supervision/Assistance  Patient can return home with the following  A little help with bathing/dressing/bathroom;Assistance with cooking/housework;Assist for transportation;Help with stairs or ramp for entrance    Equipment Recommendations None recommended by PT  Recommendations for Other Services  OT consult    Functional Status Assessment Patient has had a recent decline in their functional status and demonstrates the ability to make significant improvements in function in a reasonable and  predictable amount of time.     Precautions / Restrictions Precautions Precautions: Fall      Mobility  Bed Mobility Overal bed mobility: Modified Independent             General bed mobility comments: slight incr time and effort    Transfers Overall transfer level: Needs assistance Equipment used: None Transfers: Sit to/from Stand Sit to Stand: Min guard           General transfer comment: guarding due to reports of dizziness and initial decr in BP    Ambulation/Gait Ambulation/Gait assistance: Min guard Gait Distance (Feet): 30 Feet Assistive device: None Gait Pattern/deviations: Step-through pattern, Decreased stride length, Wide base of support   Gait velocity interpretation: 1.31 - 2.62 ft/sec, indicative of limited community ambulator   General Gait Details: reporting dizziness throughout standing for BPs and ambulation (did not worsen with more time upright); reports feels weak "overall" not just legs  Stairs            Wheelchair Mobility    Modified Rankin (Stroke Patients Only) Modified Rankin (Stroke Patients Only) Pre-Morbid Rankin Score: No significant disability Modified Rankin: Moderately severe disability     Balance Overall balance assessment: Needs assistance Sitting-balance support: No upper extremity supported, Feet supported Sitting balance-Leahy Scale: Good     Standing balance support: No upper extremity supported Standing balance-Leahy Scale: Fair Standing balance comment: stood for orthostatic BPs without sway despite reports of dizziness                             Pertinent Vitals/Pain Pain Assessment Pain Assessment: No/denies pain    Home Living Family/patient expects to be discharged to:: Private residence Living Arrangements: Spouse/significant other;Other relatives Advertising account executive (in  college)) Available Help at Discharge: Family;Available 24 hours/day Type of Home: House Home Access: Level  entry     Alternate Level Stairs-Number of Steps: flight Home Layout: Two level;Bed/bath upstairs Home Equipment: Shower seat - built in;Cane - single point      Prior Function Prior Level of Function : Independent/Modified Independent             Mobility Comments: very seldom uses cane in community       Hand Dominance   Dominant Hand: Right    Extremity/Trunk Assessment   Upper Extremity Assessment Upper Extremity Assessment: Defer to OT evaluation    Lower Extremity Assessment Lower Extremity Assessment: Generalized weakness (but symmetrical)    Cervical / Trunk Assessment Cervical / Trunk Assessment: Normal  Communication   Communication: No difficulties  Cognition Arousal/Alertness: Awake/alert Behavior During Therapy: Flat affect Overall Cognitive Status: Within Functional Limits for tasks assessed                                 General Comments: a&ox4        General Comments General comments (skin integrity, edema, etc.): Family arrived during session    Exercises     Assessment/Plan    PT Assessment Patient needs continued PT services  PT Problem List Decreased strength;Decreased activity tolerance;Decreased balance;Decreased mobility;Decreased knowledge of use of DME;Other (comment) (dizziness)       PT Treatment Interventions DME instruction;Gait training;Stair training;Functional mobility training;Therapeutic activities;Therapeutic exercise;Balance training;Neuromuscular re-education;Patient/family education    PT Goals (Current goals can be found in the Care Plan section)  Acute Rehab PT Goals Patient Stated Goal: feel stronger and able to go upstairs to his bedroom/bathroom PT Goal Formulation: With patient Time For Goal Achievement: 05/02/22 Potential to Achieve Goals: Good    Frequency Min 4X/week     Co-evaluation               AM-PAC PT "6 Clicks" Mobility  Outcome Measure Help needed turning from your  back to your side while in a flat bed without using bedrails?: None Help needed moving from lying on your back to sitting on the side of a flat bed without using bedrails?: None Help needed moving to and from a bed to a chair (including a wheelchair)?: A Little Help needed standing up from a chair using your arms (e.g., wheelchair or bedside chair)?: A Little Help needed to walk in hospital room?: A Little Help needed climbing 3-5 steps with a railing? : A Little 6 Click Score: 20    End of Session Equipment Utilized During Treatment: Gait belt Activity Tolerance: Treatment limited secondary to medical complications (Comment) (dizziness; not orthostatic) Patient left: in bed;with call bell/phone within reach;with bed alarm set;with family/visitor present   PT Visit Diagnosis: Muscle weakness (generalized) (M62.81);Other abnormalities of gait and mobility (R26.89)    Time: 3716-9678 PT Time Calculation (min) (ACUTE ONLY): 22 min   Charges:   PT Evaluation $PT Eval Low Complexity: [place], PT Acute Rehabilitation Services  Office [phone]   Rexanne Mano 04/18/2022, 4:01 PM

## 2022-04-19 ENCOUNTER — Inpatient Hospital Stay (HOSPITAL_COMMUNITY)
Admit: 2022-04-19 | Discharge: 2022-04-19 | Disposition: A | Discharge status: Home / Self Care | Payer: Medicare Other | Attending: Cardiology | Admitting: Cardiology

## 2022-04-19 ENCOUNTER — Other Ambulatory Visit: Payer: Self-pay | Admitting: Radiation Therapy

## 2022-04-19 DIAGNOSIS — R42 Dizziness and giddiness: Secondary | ICD-10-CM

## 2022-04-19 DIAGNOSIS — I639 Cerebral infarction, unspecified: Secondary | ICD-10-CM

## 2022-04-19 DIAGNOSIS — I63039 Cerebral infarction due to thrombosis of unspecified carotid artery: Secondary | ICD-10-CM

## 2022-04-19 LAB — BASIC METABOLIC PANEL
Anion gap: 6 (ref 5–15)
BUN: 10 mg/dL (ref 8–23)
CO2: 25 mmol/L (ref 22–32)
Calcium: 9.3 mg/dL (ref 8.9–10.3)
Chloride: 109 mmol/L (ref 98–111)
Creatinine, Ser: 1.31 mg/dL — ABNORMAL HIGH (ref 0.61–1.24)
GFR, Estimated: 55 mL/min — ABNORMAL LOW (ref >60)
Glucose, Bld: 90 mg/dL (ref 70–99)
Potassium: 3.8 mmol/L (ref 3.5–5.1)
Sodium: 140 mmol/L (ref 135–145)

## 2022-04-19 LAB — CBC
HCT: 35.3 % — ABNORMAL LOW (ref 39.0–52.0)
Hemoglobin: 11.7 g/dL — ABNORMAL LOW (ref 13.0–17.0)
MCH: 31.9 pg (ref 26.0–34.0)
MCHC: 33.1 g/dL (ref 30.0–36.0)
MCV: 96.2 fL (ref 80.0–100.0)
Platelets: 196 10*3/uL (ref 150–400)
RBC: 3.67 MIL/uL — ABNORMAL LOW (ref 4.22–5.81)
RDW: 13.3 % (ref 11.5–15.5)
WBC: 5.6 10*3/uL (ref 4.0–10.5)
nRBC: 0 % (ref 0.0–0.2)

## 2022-04-19 LAB — MAGNESIUM: Magnesium: 1.9 mg/dL (ref 1.7–2.4)

## 2022-04-19 MED ORDER — ASPIRIN 81 MG PO TABS: 81.0000 mg | ORAL_TABLET | Freq: Every day | ORAL | 0 refills | Status: AC

## 2022-04-19 MED ORDER — ROSUVASTATIN CALCIUM 10 MG PO TABS: 10.0000 mg | ORAL_TABLET | Freq: Every day | ORAL | 0 refills | Status: DC

## 2022-04-19 NOTE — Discharge Summary (Signed)
Physician Discharge Summary   Patient: Brandon [name] MRN: [id number] DOB: 03-05-1941  Admit date:     04/17/2022  Discharge date: 04/19/22  Discharge Physician: Lucienne Minks    PCP: Debbrah Alar, NP   Recommendations at discharge:    Please follow up with neurology and cardiology for repeat brain MRI and zio patch analysis. Please take the aspirin and crestor as prescribed   Discharge Diagnoses: Principal Problem:   CVA (cerebral vascular accident) ([place]) Active Problems:   Dizziness   CHRONIC SYSTOLIC HEART FAILURE   Essential hypertension   Stage 3a chronic kidney disease (HCC)   Asthma   Normocytic anemia   Rheumatoid arthritis (HCC)   BPH (benign prostatic hyperplasia)   Obstructive sleep apnea   Obesity, unspecified   Frequent PVCs  Resolved Problems:   * No resolved hospital problems. *  Hospital Course: 82 yo M evaluated for dizziness. Brain MRI showed a focal area of signal abormaility in the ventral R thalamus (likely recent subacute infarct), however, the radiologist did recommend a follow up enhanced brain MRI in a few weeks. The stroke team recommended to continue ASA 81 mg PO daily, a Zio patch (placed 04/19/2022) and they also started crestor 10 mg PO daily.  Stroke team did not recommend plavix at this time.  Spoke with wife on the telephone on 04/19/2022 and she is aware that the pt requires a follow brain MRI (with and without contrast) in a few weeks (as recommended by the radiologist and the stroke team).  Pt will have to follow up with neurology and cardiology for analysis of the Zio patch. Please follow up with neurology in 8 weeks.  Assessment and Plan:  ASA, Crestor, Zio patch       Consultants: Neurology  Procedures performed: Zio patch   Disposition: Home Diet recommendation:  Cardiac diet DISCHARGE MEDICATION: Allergies as of 04/19/2022       Reactions   Ace Inhibitors Cough   Isosorb Dinitrate-hydralazine Other (See Comments)    REACTION: dizziness/hypotensive        Medication List     TAKE these medications    albuterol 108 (90 Base) MCG/ACT inhaler Commonly known as: VENTOLIN HFA Inhale 1-2 puffs into the lungs every 6 (six) hours as needed for wheezing or shortness of breath. What changed: Another medication with the same name was changed. Make sure you understand how and when to take each.   albuterol (2.5 MG/3ML) 0.083% nebulizer solution Commonly known as: PROVENTIL TAKE 3 MLS BY NEBULIZER EVERY 6 HOURS AS NEEDED FOR WHEEZING FOR SHORTNESS OF BREATH What changed: See the new instructions.   amLODipine 10 MG tablet Commonly known as: NORVASC Take 1 tablet by mouth once daily   aspirin 81 MG tablet Take 1 tablet (81 mg total) by mouth daily.   b complex vitamins tablet Take 1 tablet by mouth daily.   fluticasone-salmeterol 500-50 MCG/ACT Aepb Commonly known as: ADVAIR Inhale 1 puff into the lungs in the morning and at bedtime.   furosemide 20 MG tablet Commonly known as: LASIX Take 1 tablet (20 mg total) by mouth daily. What changed:  when to take this reasons to take this   leflunomide 20 MG tablet Commonly known as: ARAVA Take 20 mg by mouth daily.   metoprolol succinate 25 MG 24 hr tablet Commonly known as: TOPROL-XL Take 1 tablet (25 mg total) by mouth daily.   montelukast 10 MG tablet Commonly known as: SINGULAIR Take 1 tablet (10 mg total) by  mouth at bedtime.   rosuvastatin 10 MG tablet Commonly known as: CRESTOR Take 1 tablet (10 mg total) by mouth daily. Start taking on: April 20, 2022   sacubitril-valsartan 97-103 MG Commonly known as: ENTRESTO Take 1 tablet by mouth 2 (two) times daily. (For medication assistance program)   spironolactone 25 MG tablet Commonly known as: ALDACTONE Take 1 tablet (25 mg total) by mouth daily.   tamsulosin 0.4 MG Caps capsule Commonly known as: FLOMAX Take 1 capsule (0.4 mg total) by mouth daily.        Follow-up  Information     [name], MD Follow up on 06/04/2022.   Specialty: Cardiology Why: at 4:00 pm --please arrive 15 min early to check in Contact information: [address] [place] [address] [phone]                Discharge Exam: Ceasar Mons Weights   04/17/22 1955  Weight: 105.2 kg   Condition at discharge: fair  The results of significant diagnostics from this hospitalization (including imaging, microbiology, ancillary and laboratory) are listed below for reference.   Imaging Studies: ECHOCARDIOGRAM COMPLETE  Result Date: 04/18/2022    ECHOCARDIOGRAM REPORT   Patient Name:   Brandon [name] Date of Exam: 04/18/2022 Medical Rec #:  [id number]       Height:       72.0 in Accession #:    [id number]      Weight:       232.0 lb Date of Birth:  11-13-1940      BSA:          2.269 m Patient Age:    81 years        BP:           135/89 mmHg Patient Gender: M               HR:           66 bpm. Exam Location:  Inpatient Procedure: 2D Echo, 3D Echo, Cardiac Doppler and Color Doppler Indications:    Stroke  History:        Patient has prior history of Echocardiogram examinations, most                 recent 01/02/2019. CHF and Cardiomyopathy, CAD, Arrythmias:RBBB;                 Risk Factors:Hypertension, Sleep Apnea and Asthma. CKD.  Sonographer:    Milda Smart Referring Phys: [id number] RONDELL A SMITH  Sonographer Comments: Suboptimal parasternal window. Image acquisition challenging due to respiratory motion. IMPRESSIONS  1. Left ventricular ejection fraction, by estimation, is 45 to 50%. The left ventricle has mildly decreased function. The left ventricle demonstrates global hypokinesis. Left ventricular diastolic parameters are indeterminate.  2. Right ventricular systolic function is normal. The right ventricular size is normal.  3. Left atrial size was moderately dilated.  4. No evidence of mitral valve regurgitation.  5. The aortic valve is grossly normal. Aortic valve  regurgitation is not visualized.  6. The inferior vena cava is normal in size with greater than 50% respiratory variability, suggesting right atrial pressure of 3 mmHg. FINDINGS  Left Ventricle: Left ventricular ejection fraction, by estimation, is 45 to 50%. The left ventricle has mildly decreased function. The left ventricle demonstrates global hypokinesis. The left ventricular internal cavity size was normal in size. There is  no left ventricular hypertrophy. Left ventricular diastolic parameters are indeterminate. Right Ventricle: The right ventricular  size is normal. Right ventricular systolic function is normal. Left Atrium: Left atrial size was moderately dilated. Right Atrium: Right atrial size was normal in size. Pericardium: There is no evidence of pericardial effusion. Mitral Valve: No evidence of mitral valve regurgitation. Tricuspid Valve: Tricuspid valve regurgitation is not demonstrated. Aortic Valve: The aortic valve is grossly normal. Aortic valve regurgitation is not visualized. Pulmonic Valve: Pulmonic valve regurgitation is not visualized. Aorta: The aortic root and ascending aorta are structurally normal, with no evidence of dilitation. Venous: The inferior vena cava is normal in size with greater than 50% respiratory variability, suggesting right atrial pressure of 3 mmHg. IAS/Shunts: No atrial level shunt detected by color flow Doppler.  LEFT VENTRICLE PLAX 2D LVIDd:         6.60 cm      Diastology LVIDs:         5.80 cm      LV e' medial:    4.35 cm/s LV PW:         0.90 cm      LV E/e' medial:  10.7 LV IVS:        0.50 cm      LV e' lateral:   5.55 cm/s LVOT diam:     2.00 cm      LV E/e' lateral: 8.4 LVOT Area:     3.14 cm  LV Volumes (MOD) LV vol d, MOD A2C: 222.0 ml 3D Volume EF: LV vol d, MOD A4C: 245.0 ml 3D EF:        39 % LV vol s, MOD A2C: 131.0 ml LV EDV:       269 ml LV vol s, MOD A4C: 137.0 ml LV ESV:       165 ml LV SV MOD A2C:     91.0 ml  LV SV:        104 ml LV SV MOD A4C:      245.0 ml LV SV MOD BP:      100.4 ml RIGHT VENTRICLE             IVC RV S prime:     10.80 cm/s  IVC diam: 1.50 cm TAPSE (M-mode): 2.3 cm LEFT ATRIUM              Index        RIGHT ATRIUM           Index LA diam:        5.50 cm  2.42 cm/m   RA Area:     17.10 cm LA Vol (A2C):   137.0 ml 60.37 ml/m  RA Volume:   43.80 ml  19.30 ml/m LA Vol (A4C):   111.0 ml 48.91 ml/m LA Biplane Vol: 132.0 ml 58.17 ml/m   AORTA Ao Root diam: 3.40 cm Ao Asc diam:  3.30 cm MITRAL VALVE MV Area (PHT): 1.95 cm    SHUNTS MV Decel Time: 390 msec    Systemic Diam: 2.00 cm MR Peak grad: 17.8 mmHg MR Vmax:      211.00 cm/s MV E velocity: 46.60 cm/s Carolan Clines Electronically signed by Carolan Clines Signature Date/Time: 04/18/2022/12:08:01 PM    Final    CT CHEST ABDOMEN PELVIS W CONTRAST  Result Date: 04/18/2022 CLINICAL DATA:  Metastatic disease evaluation, brain lesion * Tracking Code: BO * EXAM: CT CHEST, ABDOMEN, AND PELVIS WITH CONTRAST TECHNIQUE: Multidetector CT imaging of the chest, abdomen and pelvis was performed following the standard protocol during bolus administration of intravenous contrast.  RADIATION DOSE REDUCTION: This exam was performed according to the departmental dose-optimization program which includes automated exposure control, adjustment of the mA and/or kV according to patient size and/or use of iterative reconstruction technique. CONTRAST:  27mL OMNIPAQUE IOHEXOL 350 MG/ML SOLN COMPARISON:  CT chest angiogram, 03/25/2022 FINDINGS: CT CHEST FINDINGS Cardiovascular: Aortic atherosclerosis. Normal heart size. Left coronary artery calcifications. No pericardial effusion. Mediastinum/Nodes: No enlarged mediastinal, hilar, or axillary lymph nodes. Thyroid gland, trachea, and esophagus demonstrate no significant findings. Lungs/Pleura: Parke Simmers appearing dependent bibasilar scarring and or atelectasis. No pleural effusion or pneumothorax. Musculoskeletal: No chest wall abnormality. No acute osseous findings. CT  ABDOMEN PELVIS FINDINGS Hepatobiliary: No solid liver abnormality is seen. No gallstones, gallbladder wall thickening, or biliary dilatation. Pancreas: Unremarkable. No pancreatic ductal dilatation or surrounding inflammatory changes. Spleen: Normal in size without significant abnormality. Adrenals/Urinary Tract: Small intermediate attenuation adrenal nodules, measuring 2.0 x 1.5 cm in the right adrenal body (series 3, image 49) and 1.4 x 1.0 cm in the medial limb of the left adrenal gland (series 3, image 54). Simple, benign bilateral renal cortical cysts, for which no further follow-up or characterization is required. Punctuate nonobstructive calculus of the inferior pole of the right kidney (series 6, image 97). Evaluation for additional calculi is generally limited by the presence of excreted contrast in the renal collecting systems. Lobulated, multifocal bilateral renal cortical scarring. No hydronephrosis. Mild diffuse urinary bladder wall thickening, likely secondary to chronic outlet obstruction. Stomach/Bowel: Stomach is within normal limits. Appendix appears normal. No evidence of bowel wall thickening, distention, or inflammatory changes. Descending and sigmoid diverticulosis. Vascular/Lymphatic: Severe mixed aortic atherosclerosis. No enlarged abdominal or pelvic lymph nodes. Reproductive: Prostatomegaly. Other: No abdominal wall hernia or abnormality. No ascites. Musculoskeletal: No acute osseous findings. IMPRESSION: 1. No definite evidence of primary malignancy or metastatic disease in the chest, abdomen, or pelvis. 2. Small intermediate attenuation adrenal nodules, measuring 2.0 x 1.5 cm in the right adrenal body and 1.4 x 1.0 cm in the medial limb of the left adrenal gland. 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 These could be further characterized by adrenal protocol CT or MRI if clinically appropriate. 3.  Punctuate nonobstructive calculus of the inferior pole of the right kidney. Evaluation for additional calculi is generally limited by the presence of excreted contrast in the renal collecting systems. No hydronephrosis. 4. Lobulated, multifocal bilateral renal cortical scarring, consistent with infectious, obstructive, or ischemic insult 5. Prostatomegaly with mild diffuse urinary bladder wall thickening, likely secondary to chronic outlet obstruction. 6. Coronary artery disease. Aortic Atherosclerosis (ICD10-I70.0). Electronically Signed   By: Jearld Lesch M.D.   On: 04/18/2022 10:00   CT ANGIO HEAD NECK W WO CM  Result Date: 04/18/2022 CLINICAL DATA:  Abnormal right thalamus. EXAM: CT ANGIOGRAPHY HEAD AND NECK TECHNIQUE: Multidetector CT imaging of the head and neck was performed using the standard protocol during bolus administration of intravenous contrast. Multiplanar CT image reconstructions and MIPs were obtained to evaluate the vascular anatomy. Carotid stenosis measurements (when applicable) are obtained utilizing NASCET criteria, using the distal internal carotid diameter as the denominator. RADIATION DOSE REDUCTION: This exam was performed according to the departmental dose-optimization program which includes automated exposure control, adjustment of the mA and/or kV according to patient size and/or use of iterative reconstruction technique. CONTRAST:  67mL OMNIPAQUE IOHEXOL 350 MG/ML SOLN COMPARISON:  Brain MRI from earlier today FINDINGS: CTA NECK FINDINGS Aortic arch: Atheromatous plaque with 3  vessel branching. Right carotid system: Vessels are smoothly contoured and widely patent. No atheromatous changes Left carotid system: Vessels are smoothly contoured and widely patent. No atheromatous changes Vertebral arteries: Negative subclavian and vertebral arteries. 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 Upper chest: Clear apical lungs Review of the  MIP images confirms the above findings CTA HEAD FINDINGS Anterior circulation: No significant stenosis, proximal occlusion, aneurysm, or vascular malformation. Mild atheromatous plaque at the carotid siphons. Posterior circulation: No significant stenosis, proximal occlusion, aneurysm, or vascular malformation. No notable finding at the P1 segments. Venous sinuses: Unremarkable Anatomic variants: None significant Review of the MIP images confirms the above findings IMPRESSION: No emergent finding. Less than typical atheromatous changes for age with no stenosis or irregularity of major arteries in the head and neck. Electronically Signed   By: Jorje Guild M.D.   On: 04/18/2022 06:58   MR BRAIN W CONTRAST  Result Date: 04/18/2022 CLINICAL DATA:  Acute neurologic deficit with stroke suspected. Rule out malignancy. EXAM: MRI HEAD WITH CONTRAST TECHNIQUE: Multiplanar, multiecho pulse sequences of the brain and surrounding structures were obtained with intravenous contrast. CONTRAST:  96mL GADAVIST GADOBUTROL 1 MMOL/ML IV SOLN COMPARISON:  Brain MRI from earlier today FINDINGS: Brain: The T2 hyperintensity in the anterior right thalamus has wispy internal enhancement. Subacute infarcts enhance although a less reliable finding in the subcortical brain. No second lesion is seen. No hydrocephalus or collection. Vascular: Normal vascular enhancements Skull and upper cervical spine: No focal marrow lesion Sinuses/Orbits: Negative IMPRESSION: Wispy enhancement in the right thalamic lesion which is nonspecific for differentiating subacute infarct from mass. The lesion is large for an infarct in this location and viewed with suspicion, recommend follow-up enhanced brain MRI in a few weeks. Electronically Signed   By: Jorje Guild M.D.   On: 04/18/2022 06:38   MR Brain Wo Contrast (neuro protocol)  Result Date: 04/18/2022 CLINICAL DATA:  Acute neurologic deficit EXAM: MRI HEAD WITHOUT CONTRAST TECHNIQUE: Multiplanar,  multiecho pulse sequences of the brain and surrounding structures were obtained without intravenous contrast. COMPARISON:  None Available. FINDINGS: Brain: Focal area of signal abnormality in the ventral right thalamus likely a recent subacute infarct. Chronic microhemorrhage of the right cerebellum. There is multifocal hyperintense T2-weighted signal within the white matter. Generalized volume loss. The midline structures are normal. Vascular: Normal flow voids. Skull and upper cervical spine: Normal marrow signal. Sinuses/Orbits: Negative. Other: None. IMPRESSION: 1. Focal area of signal abnormality in the ventral right thalamus likely a recent subacute infarct. No hemorrhage or mass effect. 2. Findings of chronic microvascular ischemia and volume loss. Electronically Signed   By: Ulyses Jarred M.D.   On: 04/18/2022 03:49   CT Head Wo Contrast  Addendum Date: 04/17/2022   ADDENDUM REPORT: 04/17/2022 21:03 ADDENDUM: Critical Value/emergent results were called by telephone at the time of interpretation on 04/17/2022 at 9:03 pm to provider Fredia Sorrow , who verbally acknowledged these results. Electronically Signed   By: Brett Fairy M.D.   On: 04/17/2022 21:03   Result Date: 04/17/2022 CLINICAL DATA:  Neuro deficit, acute, stroke suspected. Weakness, lack of injury, dizziness. EXAM: CT HEAD WITHOUT CONTRAST TECHNIQUE: Contiguous axial images were obtained from the base of the skull through the vertex without intravenous contrast. RADIATION DOSE REDUCTION: This exam was performed according to the departmental dose-optimization program which includes automated exposure control, adjustment of the mA and/or kV according to patient size and/or use of iterative reconstruction technique. COMPARISON:  None  Available. FINDINGS: Brain: No acute intracranial hemorrhage, midline shift or mass effect. No extra-axial fluid collection. Scattered periventricular white matter hypodensities are present bilaterally. No  hydrocephalus. Calcification is noted in the basal ganglia on the right. There is a vague hypodense region in the thalamus on the right which is indeterminate in age. Vascular: No hyperdense vessel or unexpected calcification. Skull: Normal. Negative for fracture or focal lesion. Sinuses/Orbits: Round density is noted in the ethmoid air cells on the right, possible mucosal retention cyst versus polyp. No acute orbital abnormality. 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 IMPRESSION: 1. Vague hypodensity in the thalamus on the right, possible infarct of indeterminate age. MRI is recommended for further evaluation. 2. Chronic microvascular ischemic changes. Electronically Signed: By: Thornell Sartorius M.D. On: 04/17/2022 20:58   DG Chest Port 1 View  Result Date: 04/17/2022 CLINICAL DATA:  Weakness.  History of congestive heart failure. EXAM: PORTABLE CHEST 1 VIEW COMPARISON:  January 11 current right 4, chest CT FINDINGS: Tortuosity of the thoracic aorta. Cardiomediastinal silhouette is normal. Mediastinal contours appear intact. There is no evidence of focal airspace consolidation, pleural effusion or pneumothorax. Osseous structures are without acute abnormality. Soft tissues are grossly normal. IMPRESSION: No active disease. Electronically Signed   By: Ted Mcalpine M.D.   On: 04/17/2022 20:57   CT Angio Chest W/Cm &/Or Wo Cm  Result Date: 03/25/2022 CLINICAL DATA:  Cough, shortness of breath, wheezing, high clinical suspicion for PE EXAM: CT ANGIOGRAPHY CHEST WITH CONTRAST TECHNIQUE: Multidetector CT imaging of the chest was performed using the standard protocol during bolus administration of intravenous contrast. Multiplanar CT image reconstructions and MIPs were obtained to evaluate the vascular anatomy. RADIATION DOSE REDUCTION: This exam was performed according to the departmental dose-optimization program which includes automated exposure control, adjustment of the  mA and/or kV according to patient size and/or use of iterative reconstruction technique. CONTRAST:  OMNIPAQUE IOHEXOL 350 MG/ML SOLN COMPARISON:  Chest radiograph done on 03/22/2022 FINDINGS: Cardiovascular: There are no intraluminal filling defects in pulmonary artery branches. Evaluation of small peripheral pulmonary artery branches is limited by motion artifacts. Contrast density in thoracic aorta is less than adequate to evaluate the lumen. Scattered coronary artery calcifications are seen. Heart is enlarged in size with prominence of left ventricular cavity. Mediastinum/Nodes:No significant lymphadenopathy is seen. Lungs/Pleura: There is no focal pulmonary consolidation. There are small linear densities in the lower lung fields suggesting subsegmental atelectasis. Small right pleural effusion is seen. Minimal left pleural effusion is seen. There is no pneumothorax. Upper Abdomen: Diverticula are seen in splenic flexure. Musculoskeletal: No acute findings are seen. Review of the MIP images confirms the above findings. IMPRESSION: There is no evidence of pulmonary artery embolism. Coronary artery calcifications are seen. There are small linear densities in both lower lung fields suggesting scarring or subsegmental atelectasis. Small right pleural effusion. Minimal left pleural effusion. Coronary artery disease. Diverticulosis of colon. Electronically Signed   By: Ernie Avena M.D.   On: 03/25/2022 15:32   DG Chest 2 View  Result Date: 03/22/2022 CLINICAL DATA:  Shortness of breath. EXAM: CHEST - 2 VIEW COMPARISON:  04/29/2016. FINDINGS: Trachea is midline. Heart is mildly enlarged. Mild right hilar prominence appears unchanged. Minimal streaky densities in the peripheral lung bases with tiny bilateral pleural effusions. IMPRESSION: Mild congestive heart failure. Electronically Signed   By: Leanna Battles M.D.   On: 03/22/2022 10:29    Microbiology: Results for orders placed  or performed during  the hospital encounter of 04/17/22  Resp panel by RT-PCR (RSV, Flu A&B, Covid) Anterior Nasal Swab     Status: None   Collection Time: 04/17/22  8:26 PM   Specimen: Anterior Nasal Swab  Result Value Ref Range Status   SARS Coronavirus 2 by RT PCR NEGATIVE NEGATIVE Final    Comment: (NOTE) SARS-CoV-2 target nucleic acids are NOT DETECTED.  The SARS-CoV-2 RNA is generally detectable in upper respiratory specimens during the acute phase of infection. The lowest concentration of SARS-CoV-2 viral copies this assay can detect is 138 copies/mL. A negative result does not preclude SARS-Cov-2 infection and should not be used as the sole basis for treatment or other patient management decisions. A negative result may occur with  improper specimen collection/handling, submission of specimen other than nasopharyngeal swab, presence of viral mutation(s) within the areas targeted by this assay, and inadequate number of viral copies(<138 copies/mL). A negative result must be combined with clinical observations, patient history, and epidemiological information. The expected result is Negative.  Fact Sheet for Patients:  EntrepreneurPulse.com.au  Fact Sheet for Healthcare Providers:  IncredibleEmployment.be  This test is no t yet approved or cleared by the [place] FDA and  has been authorized for detection and/or diagnosis of SARS-CoV-2 by FDA under an Emergency Use Authorization (EUA). This EUA will remain  in effect (meaning this test can be used) for the duration of the COVID-19 declaration under Section 564(b)(1) of the Act, 21 U.S.C.section 360bbb-3(b)(1), unless the authorization is terminated  or revoked sooner.       Influenza A by PCR NEGATIVE NEGATIVE Final   Influenza B by PCR NEGATIVE NEGATIVE Final    Comment: (NOTE) The Xpert Xpress SARS-CoV-2/FLU/RSV plus assay is intended as an aid in the diagnosis of influenza from Nasopharyngeal swab  specimens and should not be used as a sole basis for treatment. Nasal washings and aspirates are unacceptable for Xpert Xpress SARS-CoV-2/FLU/RSV testing.  Fact Sheet for Patients: EntrepreneurPulse.com.au  Fact Sheet for Healthcare Providers: IncredibleEmployment.be  This test is not yet approved or cleared by the [place] FDA and has been authorized for detection and/or diagnosis of SARS-CoV-2 by FDA under an Emergency Use Authorization (EUA). This EUA will remain in effect (meaning this test can be used) for the duration of the COVID-19 declaration under Section 564(b)(1) of the Act, 21 U.S.C. section 360bbb-3(b)(1), unless the authorization is terminated or revoked.     Resp Syncytial Virus by PCR NEGATIVE NEGATIVE Final    Comment: (NOTE) Fact Sheet for Patients: EntrepreneurPulse.com.au  Fact Sheet for Healthcare Providers: [email or web address]  This test is not yet approved or cleared by the [place] FDA and has been authorized for detection and/or diagnosis of SARS-CoV-2 by FDA under an Emergency Use Authorization (EUA). This EUA will remain in effect (meaning this test can be used) for the duration of the COVID-19 declaration under Section 564(b)(1) of the Act, 21 U.S.C. section 360bbb-3(b)(1), unless the authorization is terminated or revoked.  Performed at [facility], [address] [place], [place] [address]     Labs: CBC: Recent Labs  Lab 04/17/22 1959 04/19/22 0300  WBC 5.7 5.6  HGB 12.7* 11.7*  HCT 39.3 35.3*  MCV 96.8 96.2  PLT 209 998   Basic Metabolic Panel: Recent Labs  Lab 04/17/22 1959 04/19/22 0300  NA 136 140  K 4.0 3.8  CL 106 109  CO2 25 25  GLUCOSE 98 90  BUN 13 10  CREATININE 1.28* 1.31*  CALCIUM 9.1 9.3  MG  --  1.9   Liver Function Tests: No results for input(s): "AST", "ALT", "ALKPHOS", "BILITOT", "PROT", "ALBUMIN" in the  last 168 hours. CBG: Recent Labs  Lab 04/17/22 2003  GLUCAP 84    Discharge time spent: greater than 30 minutes.  Signed: Baron Hamper , MD [facility] 04/19/2022

## 2022-04-19 NOTE — TOC Transition Note (Signed)
Transition of Care [facility]) - CM/SW Discharge Note   Patient Details  Name: Brandon [name] MRN: [id number] Date of Birth: 01/31/1941  Transition of Care [facility]) CM/SW Contact:  Pollie Friar, RN Phone Number: 04/19/2022, 2:30 PM   Clinical Narrative:    Pt is discharging home with self care. No f/u per PT/OT.  Pt has transportation home.    Final next level of care: Home/Self Care Barriers to Discharge: No Barriers Identified   Patient Goals and CMS Choice      Discharge Placement                         Discharge Plan and Services Additional resources added to the After Visit Summary for                                       Social Determinants of Health (SDOH) Interventions SDOH Screenings   Food Insecurity: No Food Insecurity (04/19/2022)  Housing: Low Risk  (04/19/2022)  Transportation Needs: No Transportation Needs (04/19/2022)  Utilities: Not At Risk (04/19/2022)  Alcohol Screen: Low Risk  (05/28/2021)  Depression (PHQ2-9): Low Risk  (05/28/2021)  Financial Resource Strain: Medium Risk (03/19/2021)  Physical Activity: Insufficiently Active (07/25/2020)  Social Connections: Socially Integrated (05/28/2021)  Stress: No Stress Concern Present (05/28/2021)  Tobacco Use: Medium Risk (04/17/2022)     Readmission Risk Interventions     No data to display

## 2022-04-19 NOTE — Discharge Instructions (Signed)
   Heart Monitor:   Length of Wear: 14 days   Your monitor will be placed prior to discharge.  Call the office at [phone], if you have questions at home.    Your physician has recommended that you wear a Zio AT monitor.    This monitor is a medical device that records the heart's electrical activity. Doctors most often use these monitors to diagnose arrhythmias. Arrhythmias are problems with the speed or rhythm of the heartbeat. The monitor is a small device applied to your chest. You can wear one while you do your normal daily activities. While wearing this monitor if you have any symptoms to push the button and record what you felt. Once you have worn this monitor for the period of time provider prescribed (Usually 14 days), you will return the monitor device in the postage paid box. Once it is returned they will download the data collected and provide [place] with a report which the provider will then review and we will call you with those results. Important tips:   1. Avoid showering during the first 24 hours of wearing the monitor. 2. Avoid excessive sweating to help maximize wear time. 3. Do not submerge the device, no hot tubs, and no swimming pools. 4. Keep any lotions or oils away from the patch. 5. After 24 hours you may shower with the patch on. Take brief showers with your back facing the shower head.  6. Do not remove patch once it has been placed because that will interrupt data and decrease adhesive wear time. 7. Push the button when you have any symptoms and write down what you were feeling. 8. Once you have completed wearing your monitor, remove and place into box which has postage paid and place in your outgoing mailbox.  9. If for some reason you have misplaced your box then call our office and we can provide another box and/or mail it off for you.

## 2022-04-19 NOTE — Progress Notes (Addendum)
Mobility Specialist: Progress Note   04/19/22 1415  Mobility  Activity Ambulated with assistance in hallway  Level of Assistance Contact guard assist, steadying assist  Assistive Device None  Distance Ambulated (ft) 500 ft  Activity Response Tolerated well  Mobility Referral Yes  $Mobility charge 1 Mobility   During Mobility: 100 HR Post-Mobility: 80 HR  Pt received in the bed and agreeable to mobility. Mod I with bed mobility and contact guard during ambulation. No c/o throughout. Pt sitting EOB after session with call bell and phone in reach.   Brandon [name] Mobility Specialist Please contact via SecureChat or Rehab office at [phone]

## 2022-04-19 NOTE — Progress Notes (Signed)
Occupational Therapy Treatment Patient Details Name: Brandon [name] MRN: [id number] DOB: 25-Mar-1940 Today's Date: 04/19/2022   History of present illness Presented to ED 04/17/22 due to increased lethargy, dizziness, and weakness in both legs for an unknown number of days.  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 +bradycardia; PMH-significant of hypertension, HFrEF, asthma, CKD stage IIIa, anemia, OSA, and obesity   OT comments  Patient continues to make steady progress towards goals in skilled OT session. Patient's session encompassed medi-cog assessment. Patient scoring a 5/10 on the Medi-Cog assessment, with difficulty with 3 word recall, as well as sorting pills appropriately into graph. Patient usually manages his medication at home, however endorses that he can have his wife assist and double check his medications. OT strongly advised to allow wife to drive until follow up neurology appointment. OT will continue to follow acutely.    Recommendations for follow up therapy are one component of a multi-disciplinary discharge planning process, led by the attending physician.  Recommendations may be updated based on patient status, additional functional criteria and insurance authorization.    Follow Up Recommendations  No OT follow up     Assistance Recommended at Discharge Set up Supervision/Assistance  Patient can return home with the following  Assistance with cooking/housework;Direct supervision/assist for medications management;Direct supervision/assist for financial management   Equipment Recommendations  None recommended by OT    Recommendations for Other Services      Precautions / Restrictions Precautions Precautions: Fall Restrictions Weight Bearing Restrictions: No       Mobility Bed Mobility Overal bed mobility: Independent                  Transfers                         Balance Overall balance  assessment: Needs assistance Sitting-balance support: No upper extremity supported, Feet supported Sitting balance-Leahy Scale: Good                                     ADL either performed or assessed with clinical judgement   ADL Overall ADL's : Needs assistance/impaired                                     Functional mobility during ADLs: Supervision/safety General ADL Comments: Session focus on upper level cognitive tasks. Patient scoring a 5/10 on medi-cog to date.    Extremity/Trunk Assessment              Vision       Perception     Praxis      Cognition Arousal/Alertness: Awake/alert Behavior During Therapy: Flat affect Overall Cognitive Status: Within Functional Limits for tasks assessed                                 General Comments: a&ox4, following all commands. However 5/10 score on medi-cog to date        Exercises      Shoulder Instructions       General Comments      Pertinent Vitals/ Pain       Pain Assessment Pain Assessment: No/denies pain  Home Living  Prior Functioning/Environment              Frequency  Min 2X/week        Progress Toward Goals  OT Goals(current goals can now be found in the care plan section)  Progress towards OT goals: Progressing toward goals  Acute Rehab OT Goals Patient Stated Goal: to go home OT Goal Formulation: With patient/family Time For Goal Achievement: 05/02/22 Potential to Achieve Goals: Good  Plan Discharge plan remains appropriate    Co-evaluation                 AM-PAC OT "6 Clicks" Daily Activity     Outcome Measure   Help from another person eating meals?: None Help from another person taking care of personal grooming?: None Help from another person toileting, which includes using toliet, bedpan, or urinal?: None Help from another person bathing (including  washing, rinsing, drying)?: None Help from another person to put on and taking off regular upper body clothing?: None Help from another person to put on and taking off regular lower body clothing?: None 6 Click Score: 24    End of Session Equipment Utilized During Treatment: Other (comment) (Medi Cog)  OT Visit Diagnosis: Unsteadiness on feet (R26.81);Muscle weakness (generalized) (M62.81);Low vision, both eyes (H54.2)   Activity Tolerance Patient tolerated treatment well   Patient Left in bed;with call bell/phone within reach   Nurse Communication Mobility status        Time: 9024-0973 OT Time Calculation (min): 26 min  Charges: OT General Charges $OT Visit: 1 Visit OT Treatments $Self Care/Home Management : 8-22 mins $Therapeutic Activity: 8-22 mins  Corinne Ports E. Zakia Sainato, OTR/L Acute Rehabilitation Services [phone]   Ascencion Dike 04/19/2022, 1:56 PM

## 2022-04-19 NOTE — Plan of Care (Signed)
  Problem: Education: Goal: Knowledge of General Education information will improve Description Including pain rating scale, medication(s)/side effects and non-pharmacologic comfort measures Outcome: Progressing   

## 2022-04-19 NOTE — Progress Notes (Signed)
Physical Therapy Treatment and Discharge Patient Details Name: Brandon [name] MRN: [id number] DOB: 25-Sep-1940 Today's Date: 04/19/2022   History of Present Illness Presented to ED 04/17/22 due to increased lethargy, dizziness, and weakness in both legs for an unknown number of days.  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 +bradycardia; PMH-significant of hypertension, HFrEF, asthma, CKD stage IIIa, anemia, OSA, and obesity    PT Comments    Patient mobilizing independently today, including up/down steps with rail. Denied dizziness throughout. No further PT indicated and discharge plan updated. Patient is discharged from PT.    Recommendations for follow up therapy are one component of a multi-disciplinary discharge planning process, led by the attending physician.  Recommendations may be updated based on patient status, additional functional criteria and insurance authorization.  Follow Up Recommendations  No PT follow up     Assistance Recommended at Discharge None  Patient can return home with the following     Equipment Recommendations  None recommended by PT    Recommendations for Other Services       Precautions / Restrictions Precautions Precautions: Fall Restrictions Weight Bearing Restrictions: No     Mobility  Bed Mobility Overal bed mobility: Independent             General bed mobility comments: including managing linens    Transfers Overall transfer level: Independent Equipment used: None Transfers: Sit to/from Stand Sit to Stand: [employer], Independent           General transfer comment: denies dizziness    Ambulation/Gait Ambulation/Gait assistance: Independent Gait Distance (Feet): 250 Feet Assistive device: None Gait Pattern/deviations: Wide base of support, WFL(Within Functional Limits)   Gait velocity interpretation: >2.62 ft/sec, indicative of community ambulatory   General Gait Details:  denied dizziness; no imbalance noted   Stairs Stairs: Yes Stairs assistance: Modified independent (Device/Increase time) Stair Management: One rail Left, Alternating pattern, Forwards Number of Stairs: 8 General stair comments: no cues needed   Wheelchair Mobility    Modified Rankin (Stroke Patients Only) Modified Rankin (Stroke Patients Only) Pre-Morbid Rankin Score: No significant disability Modified Rankin: No significant disability     Balance Overall balance assessment: Needs assistance Sitting-balance support: No upper extremity supported, Feet supported Sitting balance-Leahy Scale: Good     Standing balance support: No upper extremity supported Standing balance-Leahy Scale: Good                   Standardized Balance Assessment Standardized Balance Assessment : Berg Balance Test Berg Balance Test Sit to Stand: Able to stand without using hands and stabilize independently Standing Unsupported: Able to stand safely 2 minutes Sitting with Back Unsupported but Feet Supported on Floor or Stool: Able to sit safely and securely 2 minutes Stand to Sit: Sits safely with minimal use of hands Transfers: Able to transfer safely, minor use of hands Standing Unsupported with Eyes Closed: Able to stand 10 seconds safely Standing Ubsupported with Feet Together: Able to place feet together independently and stand 1 minute safely From Standing, Reach Forward with Outstretched Arm: Can reach confidently >25 cm (10") From Standing Position, Pick up Object from Floor: Able to pick up shoe safely and easily From Standing Position, Turn to Look Behind Over each Shoulder: Looks behind from both sides and weight shifts well Turn 360 Degrees: Able to turn 360 degrees safely in 4 seconds or less Standing Unsupported, Alternately Place Feet on Step/Stool: Able to stand independently and safely and  complete 8 steps in 20 seconds Standing Unsupported, One Foot in Front: Able to plae foot  ahead of the other independently and hold 30 seconds Standing on One Leg: Able to lift leg independently and hold equal to or more than 3 seconds Total Score: 53        Cognition Arousal/Alertness: Awake/alert Behavior During Therapy: Flat affect Overall Cognitive Status: Within Functional Limits for tasks assessed                                          Exercises      General Comments        Pertinent Vitals/Pain Pain Assessment Pain Assessment: No/denies pain    Home Living                          Prior Function            PT Goals (current goals can now be found in the care plan section) Acute Rehab PT Goals Patient Stated Goal: feel stronger and able to go upstairs to his bedroom/bathroom PT Goal Formulation: With patient Time For Goal Achievement: 05/02/22 Potential to Achieve Goals: Good Progress towards PT goals: Goals met/education completed, patient discharged from PT    Frequency    Min 4X/week      PT Plan Discharge plan needs to be updated    Co-evaluation              AM-PAC PT "6 Clicks" Mobility   Outcome Measure  Help needed turning from your back to your side while in a flat bed without using bedrails?: None Help needed moving from lying on your back to sitting on the side of a flat bed without using bedrails?: None Help needed moving to and from a bed to a chair (including a wheelchair)?: None Help needed standing up from a chair using your arms (e.g., wheelchair or bedside chair)?: None Help needed to walk in hospital room?: None Help needed climbing 3-5 steps with a railing? : None 6 Click Score: 24    End of Session Equipment Utilized During Treatment: Gait belt Activity Tolerance: Patient tolerated treatment well Patient left: with call bell/phone within reach;in chair Nurse Communication: Other (comment);Mobility status (no chair alarm, but pt independent with gait; RN ok with pt being OOB with  call bell in reach) PT Visit Diagnosis: Muscle weakness (generalized) (M62.81);Other abnormalities of gait and mobility (R26.89)   PT Discharge Note  Patient is being discharged from PT services secondary to:  Goals met and no further therapy needs identified.  Please see latest Therapy Progress Note for current level of functioning and progress toward goals.  Progress and discharge plan and discussed with patient/caregiver and they  Agree   Time: 3532-9924 PT Time Calculation (min) (ACUTE ONLY): 10 min  Charges:  $Gait Training: 8-22 mins                      [place]  Office [phone]    Rexanne Mano 04/19/2022, 10:03 AM

## 2022-04-20 ENCOUNTER — Telehealth: Payer: Self-pay | Admitting: *Deleted

## 2022-04-20 ENCOUNTER — Encounter: Payer: Self-pay | Admitting: *Deleted

## 2022-04-20 ENCOUNTER — Telehealth: Payer: Self-pay | Admitting: Cardiology

## 2022-04-20 ENCOUNTER — Other Ambulatory Visit (INDEPENDENT_AMBULATORY_CARE_PROVIDER_SITE_OTHER): Payer: Medicare Other

## 2022-04-20 ENCOUNTER — Telehealth: Payer: Self-pay | Admitting: Internal Medicine

## 2022-04-20 ENCOUNTER — Other Ambulatory Visit: Payer: Self-pay | Admitting: Radiation Therapy

## 2022-04-20 ENCOUNTER — Ambulatory Visit: Payer: Medicare Other | Admitting: Cardiology

## 2022-04-20 DIAGNOSIS — R9402 Abnormal brain scan: Secondary | ICD-10-CM

## 2022-04-20 DIAGNOSIS — R42 Dizziness and giddiness: Secondary | ICD-10-CM

## 2022-04-20 DIAGNOSIS — I63039 Cerebral infarction due to thrombosis of unspecified carotid artery: Secondary | ICD-10-CM

## 2022-04-20 DIAGNOSIS — I639 Cerebral infarction, unspecified: Secondary | ICD-10-CM

## 2022-04-20 NOTE — Progress Notes (Signed)
  Care Coordination  Note  04/20/2022 Name: Brandon [name] MRN: [id number] DOB: February 12, 1941  Brandon [name] is a 82 y.o. year old primary care patient of Debbrah Alar, NP.   Follow up plan: Hospital Follow Up appointment scheduled with [name]) on (04/21/2022) at (1140).  Julian Hy, [place] Direct Dial: [phone]

## 2022-04-20 NOTE — Telephone Encounter (Signed)
I reached out to [facility], monitor department, she states the order was sent to the EKG department. She had the order fixed and will mail the monitor to the pt. Called pt to let him know the monitor will be mailed to him. He verbalized understanding, no further questions at this time.

## 2022-04-20 NOTE — Progress Notes (Unsigned)
Enrolled for [employer] to mail a ZIO AT Live Telemetry monitor to patients address on file.   Dr. Hochrein to read. 

## 2022-04-20 NOTE — Telephone Encounter (Signed)
Scheduled appt per 2/6 referral. Pt is aware of appt date and time. Pt is aware to arrive 15 mins prior to appt time and to bring and updated insurance card. Pt is aware of appt location.

## 2022-04-20 NOTE — Patient Outreach (Signed)
Care Coordination [facility] Note Transition Care Management Follow-up Telephone Call Date of discharge and from where: Monday 04/19/22 Brandon [name]; lethargy, weakness/ CVA How have you been since you were released from the hospital? "I am doing okay overall; still feeling a little weak and woozy, but I am able to get around the house.  I am planning to pick up the statin medication they prescribed today at my outpatient pharmacy.  My wife is here looking out for me, she helps me with anything if I need it.  Yes, go ahead and schedule the PCP appointment for tomorrow.  I will make sure I go there and take a list of my medications.  I don't weigh myself at home every single day, but I do a couple of times a week." Any questions or concerns? Yes -- no hospital follow up visit scheduled with PCP: facilitated scheduling of same in real time/ care coordination with scheduling care guide -- does not perform daily weights at home- education provided around purpose/ value of same as earliest indicator of fluid retention; confirmed patient takes diuretic prn using gauge as peripheral edema; encouraged him to consider monitoring/ recording daily weights and using as indicator for need of diuretic-- he states he will consider/ discuss with PCP at tomorrow's scheduled hospital follow up office visit -- has not yet obtained newly prescribed statin: reports this is ready for pick up along with several other medications at outpatient pharmacy- he assures me he will obtain/ start taking today and this was encouraged -- patient declines need for ongoing care coordination outreach at this time-- provided him my direct phone number should questions/ concerns/ needs arise post-TOC call today  Items Reviewed: Did the pt receive and understand the discharge instructions provided? Yes  thoroughly reviewed all aspects of discharge instructions with patient Medications obtained and verified? Yes  full medication review completed- see  interventions below Other? No  Any new allergies since your discharge? No  Dietary orders reviewed? Yes Do you have support at home? Yes  patient reports he is independent in self-care activities; wife assists as needed/ indicated  Home Care and Equipment/Supplies: Were home health services ordered? no If so, what is the name of the agency? N/A  Has the agency set up a time to come to the patient's home? not applicable Were any new equipment or medical supplies ordered?  No What is the name of the medical supply agency? N/A Were you able to get the supplies/equipment? not applicable Do you have any questions related to the use of the equipment or supplies? No N/A  Functional Questionnaire: (I = Independent and D = Dependent) ADLs: I  Bathing/Dressing- I  Meal Prep- I  Eating- I  Maintaining continence- I  Transferring/Ambulation- I  Managing Meds- I  Follow up appointments reviewed:  PCP Hospital f/u appt confirmed? Yes  Scheduled to see PCP on tomorrow 04/21/22 @ 11:40 am- visit scheduled in real-time as a result of TOC call today [facility] f/u appt confirmed? Yes  Scheduled to see oncology provider on Tuesday 05/04/22 @ 10:30 am Are transportation arrangements needed? No  If their condition worsens, is the pt aware to call PCP or go to the Emergency Dept.? Yes Was the patient provided with contact information for the PCP's office or ED? No- patient declined; reports already has contact information for all care providers Was to pt encouraged to call back with questions or concerns? Yes- provided him my direct phone number should questions/ concerns/ needs arise  post-TOC call today  SDOH assessments and interventions completed:   Yes SDOH Interventions Today    Flowsheet Row Most Recent Value  SDOH Interventions   Transportation Interventions Intervention Not Indicated  [normally drives self,  wife provides transportation as needed]      Interventions Today     Flowsheet Row Most Recent Value  General Interventions   General Interventions Discussed/Reviewed General Interventions Discussed, Doctor Visits  Doctor Visits Discussed/Reviewed Doctor Visits Discussed, PCP, Specialist  [care Coordination outreach in real time with scheduling care guide to facilitate scheduling of hospital follow up office visit: completed-- scheduled for tomorrow 04/21/22 at 11:40 am]  PCP/Specialist Visits Compliance with follow-up visit  Education Interventions   Education Provided Provided Verbal Education  Provided Verbal Education On When to see the doctor, Medication  [need to obtain/ start newly prescribed medication post-hospital discharge,  reviewed scheduled provider appointments,  reviewed post-discharge instructions thoroughly,  importance/ purpose of daily weights in setting of CHF]  Nutrition Interventions   Nutrition Discussed/Reviewed Nutrition Discussed  Pharmacy Interventions   Pharmacy Dicussed/Reviewed Pharmacy Topics Discussed  [Full medication review completed today,  no discrepancies or concerns identified,  patient self manages medications,  denies questions/ concerns around meds,  reports to pick up newly prescribed statin today]       Care Coordination Interventions:  PCP follow up appointment requested Education provided as above for self-management of CHF; full medication review completed    Encounter Outcome:  Pt. Visit Completed

## 2022-04-20 NOTE — Telephone Encounter (Signed)
Patient stated he was discharged for the hospital and is following-up on getting a Zio patch.

## 2022-04-21 ENCOUNTER — Telehealth: Payer: Self-pay | Admitting: Family

## 2022-04-21 ENCOUNTER — Ambulatory Visit: Payer: Medicare Other | Admitting: Family

## 2022-04-21 VITALS — BP 112/58 | HR 65 | Temp 98.4°F | Resp 16 | Wt 217.0 lb

## 2022-04-21 DIAGNOSIS — E785 Hyperlipidemia, unspecified: Secondary | ICD-10-CM

## 2022-04-21 DIAGNOSIS — J45909 Unspecified asthma, uncomplicated: Secondary | ICD-10-CM | POA: Diagnosis not present

## 2022-04-21 DIAGNOSIS — R609 Edema, unspecified: Secondary | ICD-10-CM

## 2022-04-21 DIAGNOSIS — E538 Deficiency of other specified B group vitamins: Secondary | ICD-10-CM | POA: Diagnosis not present

## 2022-04-21 DIAGNOSIS — I499 Cardiac arrhythmia, unspecified: Secondary | ICD-10-CM

## 2022-04-21 DIAGNOSIS — R9089 Other abnormal findings on diagnostic imaging of central nervous system: Secondary | ICD-10-CM | POA: Diagnosis not present

## 2022-04-21 DIAGNOSIS — I10 Essential (primary) hypertension: Secondary | ICD-10-CM

## 2022-04-21 DIAGNOSIS — N401 Enlarged prostate with lower urinary tract symptoms: Secondary | ICD-10-CM

## 2022-04-21 NOTE — Assessment & Plan Note (Signed)
Just started crestor. In setting of possible new stroke, goal LDL <70.

## 2022-04-21 NOTE — Assessment & Plan Note (Signed)
Noted on exam. EKG is performed and personally reviewed. 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  Advised pt to follow through with zio patch and cardiology consult as scheduled.

## 2022-04-21 NOTE — Telephone Encounter (Signed)
See mychart.  

## 2022-04-21 NOTE — Assessment & Plan Note (Signed)
Continue b12 injections monthly.  

## 2022-04-21 NOTE — Progress Notes (Signed)
Subjective:   By signing my name below, I, Shehryar Baig, attest that this documentation has been prepared under the direction and in the presence of Debbrah Alar, NP. 04/21/2022   Patient ID: Brandon [name], male    DOB: July 07, 1940, 82 y.o.   MRN: [id number]  Chief Complaint  Patient presents with   Hospitalization Follow-up    HPI Patient is in today for a hospital follow up visit.   Hospital visit: He was admitted to the hospital on 04/17/2022 and was discharged on 04/19/2022 for dizziness. He was found to have ? Thalamic stroke versus possible brain mass on imaging. Prior to being admitted he was lethargic and sleeping more frequently. He was recommended to continue 81 mg aspirin daily PO, start taking 10 mg Crestor daily PO. He feels fatigued and weak since leaving the hospital. He denies having any unilateral weakness or slurred speech prior to being admitted.  Reports that PT evaluation in the hospital did not recommend any ongoing PT/OT needs following discharge.   Urinating: He continues taking Flomax and reports urinating normally.  SOB: He is having SOB of breath that worsened since last visit. He gets SOB easily while going up the stairs at his house.   Appetite: His wife reports his appetite has decreased.  Cardiology and neurology: He has a follow up appointment  scheduled with cardiology and states that they have ordered him a Zio Monitor. He does not have a follow up neurology appointment set yet. He is scheduled to meet with oncology for further evaluation of the brain abnormality noted on imaging.    Past Medical History:  Diagnosis Date   Anemia    Asthma    Hx of childhood asthma, disappeared for a while, then resurfaced 6-7 years ago.    Cardiomyopathy    with a negative cardiac catheterization in the past. 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     Past Surgical History:  Procedure Laterality Date   None      Family History  Problem Relation Age of Onset   Cancer Father        multiple melanoma   Lupus Sister        died at 67   Diabetes Mellitus II Sister        died from covid-19   Asthma Daughter    Neuropathy Sister    Colon cancer Neg Hx    Esophageal cancer Neg Hx     Social History   Socioeconomic History   Marital status: Married    Spouse name: [name]   Number of children: 3   Years of education: Not on file   Highest education level: Not on file  Occupational History   Occupation: retired  Tobacco Use   Smoking status: Former   Smokeless tobacco: Never   Tobacco comments:    quit smoling in 1990. started when 18, 1 ppd  Vaping Use   Vaping Use: Never used  Substance and Sexual Activity   Alcohol use: Yes    Comment: occ   Drug use: Never   Sexual activity: Not on file  Other Topics Concern   Not on file  Social History Narrative   Grew up in [place], attended [employer]. First wife died  of breast ca in 1999. 3 children. Remarried- 8 years. 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    Pt signed designated party release granting access to [facility] to his wife Tonia Ghent. Detailed message may be left on home or cell phone. Shanon Payor August 13, 2009 11:34 am.    Social Determinants of Health   Financial Resource Strain: Medium Risk (03/19/2021)   Overall Financial Resource Strain (CARDIA)    Difficulty of Paying Living Expenses: Somewhat hard  Food Insecurity: No Food Insecurity (04/19/2022)   Hunger Vital Sign    Worried About Running Out of Food in the Last Year: Never true    Ran Out of Food in the Last Year: Never true  Transportation Needs: No Transportation Needs (04/20/2022)   PRAPARE - Hydrologist (Medical): No    Lack of Transportation (Non-Medical): No  Physical  Activity: Insufficiently Active (07/25/2020)   Exercise Vital Sign    Days of Exercise per Week: 5 days    Minutes of Exercise per Session: 10 min  Stress: No Stress Concern Present (05/28/2021)   [place]    Feeling of Stress : Not at all  Social Connections: [place] (05/28/2021)   Social Connection and Isolation Panel [NHANES]    Frequency of Communication with Friends and Family: More than three times a week    Frequency of Social Gatherings with Friends and Family: Three times a week    Attends Religious Services: More than 4 times per year    Active Member of Clubs or Organizations: Yes    Attends Archivist Meetings: More than 4 times per year    Marital Status: Married  Human resources officer Violence: Not At Risk (04/19/2022)   Humiliation, Afraid, Rape, and Kick questionnaire    Fear of Current or Ex-Partner: No    Emotionally Abused: No    Physically Abused: No    Sexually Abused: No    Outpatient Medications Prior to Visit  Medication Sig Dispense Refill   albuterol (PROVENTIL) (2.5 MG/3ML) 0.083% nebulizer solution TAKE 3 MLS BY NEBULIZER EVERY 6 HOURS AS NEEDED FOR WHEEZING FOR SHORTNESS OF BREATH (Patient taking differently: Take 2.5 mg by nebulization every 6 (six) hours as needed for wheezing or shortness of breath.) 150 mL 3   albuterol (VENTOLIN HFA) 108 (90 Base) MCG/ACT inhaler Inhale 1-2 puffs into the lungs every 6 (six) hours as needed for wheezing or shortness of breath. 8 g 0   amLODipine (NORVASC) 10 MG tablet Take 1 tablet by mouth once daily 90 tablet 0   aspirin 81 MG tablet Take 1 tablet (81 mg total) by mouth daily. 30 tablet 0   b complex vitamins tablet Take 1 tablet by mouth daily.     fluticasone-salmeterol (ADVAIR) 500-50 MCG/ACT AEPB Inhale 1 puff into the lungs in the morning and at bedtime. 60 each 6   furosemide (LASIX) 20 MG tablet Take 1 tablet (20 mg total) by mouth  daily. (Patient taking differently: Take 20 mg by mouth daily as needed for fluid.) 30 tablet 3   leflunomide (ARAVA) 20 MG tablet Take 20 mg by mouth daily.     metoprolol succinate (TOPROL-XL) 25 MG 24 hr tablet Take 1 tablet (25 mg total) by mouth daily. 90 tablet 1   montelukast (SINGULAIR) 10 MG tablet Take 1 tablet (10 mg total) by mouth at bedtime. 90 tablet 1   rosuvastatin (  CRESTOR) 10 MG tablet Take 1 tablet (10 mg total) by mouth daily. 30 tablet 0   sacubitril-valsartan (ENTRESTO) 97-103 MG Take 1 tablet by mouth 2 (two) times daily. (For medication assistance program) 180 tablet 1   spironolactone (ALDACTONE) 25 MG tablet Take 1 tablet (25 mg total) by mouth daily. 90 tablet 1   tamsulosin (FLOMAX) 0.4 MG CAPS capsule Take 1 capsule (0.4 mg total) by mouth daily. 90 capsule 1   No facility-administered medications prior to visit.    Allergies  Allergen Reactions   Ace Inhibitors Cough   Isosorb Dinitrate-Hydralazine Other (See Comments)    REACTION: dizziness/hypotensive    Review of Systems  Constitutional:  Positive for malaise/fatigue.       Objective:    Physical Exam Constitutional:      General: He is not in acute distress.    Appearance: Normal appearance. He is not ill-appearing.  HENT:     Head: Normocephalic and atraumatic.     Right Ear: External ear normal.     Left Ear: External ear normal.  Eyes:     Extraocular Movements: Extraocular movements intact.     Right eye: No nystagmus.     Left eye: No nystagmus.     Pupils: Pupils are equal, round, and reactive to light.  Cardiovascular:     Rate and Rhythm: Regular rhythm. Tachycardia present.     Heart sounds: Normal heart sounds. No murmur heard.    No gallop.     Comments: Mild tachycardia Pulmonary:     Effort: Pulmonary effort is normal. No respiratory distress.     Breath sounds: Normal breath sounds. No wheezing or rales.  Musculoskeletal:     Comments: Slight left upper extremity  weakness  Skin:    General: Skin is warm and dry.     Comments: Mild bilateral eyelid puffiness noted  Neurological:     Mental Status: He is alert and oriented to person, place, and time.     Comments: Cranial nerves intact Speech is clear but pt's voice and overall affect is weaker than his baseline  Psychiatric:        Judgment: Judgment normal.     BP (!) 112/58 (BP Location: Right Arm, Patient Position: Sitting, Cuff Size: Large)   Pulse 65   Temp 98.4 F (36.9 C) (Oral)   Resp 16   Wt 217 lb (98.4 kg)   SpO2 98%   BMI 29.43 kg/m  Wt Readings from Last 3 Encounters:  04/21/22 217 lb (98.4 kg)  04/17/22 232 lb (105.2 kg)  03/30/22 236 lb (107 kg)       Assessment & Plan:  Irregular heart rate Assessment & Plan: Noted on exam. EKG is performed and personally reviewed. 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  Advised pt to follow through with zio patch and cardiology consult as scheduled.      Orders: -     EKG 12-Lead  Abnormal brain MRI Assessment & Plan: MRI report is reviewed and there is concern about thalamic mass versus CVA. It is recommended that he undergo repeat MRI with imaging in a few weeks. He is scheduled for consultation with oncology.  He was continued on aspirin for secondary stroke prevention and crestor was added to his regimen. Will arrange post hospital follow up with with Neurology.   Orders: -     Ambulatory referral to Neurology  Edema, unspecified type Assessment & Plan: No significant LE edema. His  weight is down considerably, likely due to poor appetite.  Continue aldactone and prn lasix. Monitor.    Uncomplicated asthma, unspecified asthma severity, unspecified whether persistent Assessment & Plan: Clinically stable. Continue albuterol prn.    B12 deficiency Assessment & Plan: Continue b12 injections monthly.    Benign prostatic hyperplasia with lower urinary tract symptoms, symptom details  unspecified Assessment & Plan: Stable on flomax. Continue same.    Hyperlipidemia, unspecified hyperlipidemia type Assessment & Plan: Just started crestor. In setting of possible new stroke, goal LDL <70.     Essential hypertension Assessment & Plan: BP stable. Maintained on entresto and amlodipine.  BP Readings from Last 3 Encounters:  04/21/22 (!) 112/58  04/19/22 119/74  03/30/22 124/71      40 minutes spent on today's visit. Time was spent counseling pt on imaging results, need for additional consultations and reviewing hospital records.   I, Nance Pear, NP, personally preformed the services described in this documentation.  All medical record entries made by the scribe were at my direction and in my presence.  I have reviewed the chart and discharge instructions (if applicable) and agree that the record reflects my personal performance and is accurate and complete. 04/21/2022   I,Shehryar Baig,acting as a scribe for Nance Pear, NP.,have documented all relevant documentation on the behalf of Nance Pear, NP,as directed by  Nance Pear, NP while in the presence of Nance Pear, NP.   Nance Pear, NP

## 2022-04-21 NOTE — Assessment & Plan Note (Addendum)
MRI report is reviewed and there is concern about thalamic mass versus CVA. It is recommended that he undergo repeat MRI with imaging in a few weeks. He is scheduled for consultation with oncology.  He was continued on aspirin for secondary stroke prevention and crestor was added to his regimen. Will arrange post hospital follow up with with Neurology.

## 2022-04-21 NOTE — Assessment & Plan Note (Signed)
Clinically stable. Continue albuterol prn.

## 2022-04-21 NOTE — Assessment & Plan Note (Signed)
No significant LE edema. His weight is down considerably, likely due to poor appetite.  Continue aldactone and prn lasix. Monitor.

## 2022-04-21 NOTE — Assessment & Plan Note (Signed)
Stable on flomax. Continue same.  

## 2022-04-21 NOTE — Assessment & Plan Note (Signed)
BP stable. Maintained on entresto and amlodipine.  BP Readings from Last 3 Encounters:  04/21/22 (!) 112/58  04/19/22 119/74  03/30/22 124/71

## 2022-04-22 DIAGNOSIS — R42 Dizziness and giddiness: Secondary | ICD-10-CM

## 2022-04-22 DIAGNOSIS — I63039 Cerebral infarction due to thrombosis of unspecified carotid artery: Secondary | ICD-10-CM | POA: Diagnosis not present

## 2022-04-22 DIAGNOSIS — I639 Cerebral infarction, unspecified: Secondary | ICD-10-CM

## 2022-04-23 ENCOUNTER — Encounter: Payer: Self-pay | Admitting: Genetic Counselor

## 2022-04-23 DIAGNOSIS — I639 Cerebral infarction, unspecified: Secondary | ICD-10-CM | POA: Diagnosis not present

## 2022-04-23 DIAGNOSIS — R42 Dizziness and giddiness: Secondary | ICD-10-CM | POA: Diagnosis not present

## 2022-04-23 LAB — VITAMIN B1: Vitamin B1 (Thiamine): 98.6 nmol/L (ref 66.5–200.0)

## 2022-04-26 ENCOUNTER — Telehealth: Payer: Self-pay | Admitting: Pharmacist

## 2022-04-26 ENCOUNTER — Ambulatory Visit (INDEPENDENT_AMBULATORY_CARE_PROVIDER_SITE_OTHER): Payer: Medicare Other | Admitting: Pharmacist

## 2022-04-26 ENCOUNTER — Inpatient Hospital Stay: Payer: Medicare Other | Attending: Internal Medicine

## 2022-04-26 ENCOUNTER — Other Ambulatory Visit: Payer: Self-pay | Admitting: Internal Medicine

## 2022-04-26 DIAGNOSIS — I639 Cerebral infarction, unspecified: Secondary | ICD-10-CM

## 2022-04-26 DIAGNOSIS — I5022 Chronic systolic (congestive) heart failure: Secondary | ICD-10-CM

## 2022-04-26 DIAGNOSIS — I10 Essential (primary) hypertension: Secondary | ICD-10-CM

## 2022-04-26 DIAGNOSIS — R9089 Other abnormal findings on diagnostic imaging of central nervous system: Secondary | ICD-10-CM

## 2022-04-26 NOTE — Telephone Encounter (Signed)
Patient states that he has not received notification regarding 2024 [facility] patient assistance program. Called [employer] medication assistance program and they have not received [id number] application.  Application was faxed [phone] to cardiology office since Dr Percival Spanish prescribes Castana.  Called cardio office.  Requested refax application. Faxed to [phone].  They has the lower dose Entresto samples 49/75m -  #28 - patient will take 2 tablets twice a day.  They also asked of patient could bring in application when he comes to pick up samples.

## 2022-04-26 NOTE — Progress Notes (Signed)
Pharmacy Note  04/26/2022 Name: Brandon [name] MRN: [id number] DOB: 10-24-1940  Subjective: Brandon [name] is a 82 y.o. year old male who is a primary care patient of Debbrah Alar, NP. Clinical Pharmacist Practitioner referral was placed to assist with medication management.    Engaged with patient by telephone for follow up visit today.  CHF: Current treatment - Entresto 97/149m twice a day, fuorsemide 230mdaily if needed, metoprolol succinate ER 2576maily, spironolactone 7m50mily.  Patient denied shortness of breath or edema.  Weight much lower after hospitalization.  Patient reports he only has 1 week of Entresto left. Has gotten form medication assistance program in past. Application for medication assistance program was faxed to cardio 02/2022 but they were unable to locate application.   Wt Readings from Last 3 Encounters:  04/21/22 217 lb (98.4 kg)  04/17/22 232 lb (105.2 kg)  03/30/22 236 lb (107 kg)    Hypertension:  Taking Entresto, metoprolol ER 7mg52mly and amlodipine 10mg 43my.   CVA: Patient was recently hospitalized 04/18/22 to 04/17/2022 for CVA after he presented to ED with dizziness. Brain MRI showed a focal area of signal abormaility in the ventral R thalamus (likely recent subacute infarct). Radiologist recommended a follow up enhanced brain MRI in a few weeks. The stroke team recommended to continue ASA 81 mg PO daily, a Zio patch (placed 04/19/2022) and started crestor 10 mg PO daily.  Stroke team did not recommend plavix at this time.   SDOH (Social Determinants of Health) assessments and interventions performed:  SDOH Interventions    Flowsheet Row Telephone from [facility]/20[facility]2022 [facility]  [facility]        Housing Interventions -- Intervention Not Indicated -- -- -- --  Transportation Interventions Intervention Not Indicated  [normally drives self,  wife provides transportation as needed] Intervention Not Indicated Intervention Not Indicated -- -- --  Financial Strain Interventions -- -- -- Other (Comment)  [patient has been approved to received Advair and Entresto from patient assistance programs thru 03/14/2022] Other (Comment)  [completed application for Advair and working on app for MAP for Entresto] Other (Comment)  [patient will complete application for Advair PAP once reaches $600 out of pocket spend require (at $500 currently),  Referral to care coordination to check community resources for assistance with dental costs.]  Stress Interventions -- Intervention Not Indicated -- -- -- --  Social Connections Interventions -- Intervention Not Indicated -- -- -- --        Objective: Review of patient status, including review of consultants reports, laboratory and other test data, was performed as part of comprehensive.  Lab Results  Component Value Date   CREATININE 1.31 (H) 04/19/2022   CREATININE 1.28 (H) 04/17/2022   CREATININE 1.37 03/30/2022    Lab Results  Component Value Date   HGBA1C 5.1 04/18/2022       Component Value Date/Time   CHOL 188 04/18/2022 1254   TRIG 59 04/18/2022 1254   HDL 51 04/18/2022 1254   CHOLHDL 3.7  04/18/2022 1254   VLDL 12 04/18/2022 1254   LDLCALC 125 (H) 04/18/2022 1254     Clinical ASCVD: Yes  The ASCVD Risk score (Arnett DK, et al., 2019) failed to calculate for the following reasons:   The 2019 ASCVD risk score is only valid for ages 31 to 46   The  patient has a prior MI or stroke diagnosis    BP Readings from Last 3 Encounters:  04/21/22 (!) 112/58  04/19/22 119/74  03/30/22 124/71     Allergies  Allergen Reactions   Ace Inhibitors Cough   Isosorb Dinitrate-Hydralazine Other (See Comments)    REACTION: dizziness/hypotensive    Medications Reviewed Today     Reviewed by [name], CMA (Certified Medical Assistant) on 04/21/22 at 1144  Med List Status: <None>   Medication Order Taking? Sig Documenting Provider Last Dose Status Informant  albuterol (PROVENTIL) (2.5 MG/3ML) 0.083% nebulizer solution [id number] Yes TAKE 3 MLS BY NEBULIZER EVERY 6 HOURS AS NEEDED FOR WHEEZING FOR SHORTNESS OF BREATH  Patient taking differently: Take 2.5 mg by nebulization every 6 (six) hours as needed for wheezing or shortness of breath.   Debbrah Alar, NP Taking Active Self  albuterol (VENTOLIN HFA) 108 (90 Base) MCG/ACT inhaler [id number] Yes Inhale 1-2 puffs into the lungs every 6 (six) hours as needed for wheezing or shortness of breath. Debbrah Alar, NP Taking Active Self  amLODipine (NORVASC) 10 MG tablet [id number] Yes Take 1 tablet by mouth once daily Debbrah Alar, NP Taking Active Self  aspirin 81 MG tablet [id number] Yes Take 1 tablet (81 mg total) by mouth daily. Lucienne Minks, MD Taking Active   b complex vitamins tablet [id number] Yes Take 1 tablet by mouth daily. Debbrah Alar, NP Taking Active Self  fluticasone-salmeterol (ADVAIR) 500-50 MCG/ACT AEPB [id number] Yes Inhale 1 puff into the lungs in the morning and at bedtime. Debbrah Alar, NP Taking Active Self  furosemide (LASIX) 20 MG tablet [id number] Yes Take 1 tablet (20 mg total) by mouth daily.  Patient taking differently: Take 20 mg by mouth daily as needed for fluid.   Debbrah Alar, NP Taking Active Self           Med Note (WHITE, Zandra Abts Apr 18, 2022  6:23 AM) Evelina Bucy for 3 days last week  leflunomide (ARAVA) 20 MG tablet [id number] Yes  Take 20 mg by mouth daily. [provider] Taking Active Self  metoprolol succinate (TOPROL-XL) 25 MG 24 hr tablet [id number] Yes Take 1 tablet (25 mg total) by mouth daily. Debbrah Alar, NP Taking Active Self  montelukast (SINGULAIR) 10 MG tablet [id number] Yes Take 1 tablet (10 mg total) by mouth at bedtime. Debbrah Alar, NP Taking Active Self  rosuvastatin (CRESTOR) 10 MG tablet [id number] Yes Take 1 tablet (10 mg total) by mouth daily. Lucienne Minks, MD Taking Active            Med Note Knox Royalty   Tue Apr 20, 2022 11:04 AM) 04/20/22: Reports he is scheduled to pick up today from [place] (ENTRESTO) 97-103 MG [id number] Yes Take 1 tablet by mouth 2 (two) times daily. (For medication assistance program) Minus Breeding, MD Taking Active Self           Med Note Jonathon Addair Feb 18, 2022  2:05 PM) Getting from medication assistance program 2023  spironolactone (ALDACTONE) 25 MG tablet [id number] Yes Take 1 tablet (25 mg total) by mouth daily. [name],  [name], NP Taking Active Self  tamsulosin (FLOMAX) 0.4 MG CAPS capsule [id number] Yes Take 1 capsule (0.4 mg total) by mouth daily. 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[place]) 08/12/2020   BPH (benign prostatic hyperplasia) 99991111   Folic acid deficiency 99991111   Educated about COVID-19 virus infection 10/17/2019   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  See med assistance below regarding Entresto patient assistance program Continue to weigh daily - report weight gain of more than 3 lbs in 24 hours or 5 lbs in 1 week.  Hypertension / CVA:  Check blood pressure at home once daily and record.  Continue current medications to lower blood pressure.  Continue rosuvastatin 28m daily - goal LDL < 55 Continue aspirin 887mdaily Medication Assistance:  Application for Entresto  medication assistance program. in process.  Anticipated assistance start date 05/14/2022.  See plan of care for additional detail. Refaxed Entresto application to cardio office. Also left original application at front desk for patient's wife to pick up. They have appointment with cardiology on 04/30/2022 so will take with them. Cardiology office also has Entresto samples for patient.   Medication management:  Reviewed and updated medication list Reviewed refill history and adherence   Follow Up:  Telephone follow up appointment with care management team member scheduled for:  2 to 4 weeks to check BP   TaCherre RobinsPharmD Clinical Pharmacist [phone]

## 2022-04-27 ENCOUNTER — Telehealth: Payer: Self-pay | Admitting: Radiation Therapy

## 2022-04-27 ENCOUNTER — Other Ambulatory Visit: Payer: Self-pay | Admitting: Radiation Therapy

## 2022-04-27 NOTE — Telephone Encounter (Signed)
I spoke with Brandon [name] about the change in his schedule to meet with Dr. Mickeal Skinner. Dr. Mickeal Skinner has asked that we get another brain MRI in early March and move the consult after that scan for review. The scan will be completed on 05/20/22 and the consult moved to 05/24/22. Brandon [name] is aware of this change and was thankful for the call.   Mont Dutton R.T.(R)(T) Radiation Special Procedures Navigator

## 2022-04-27 NOTE — Telephone Encounter (Signed)
Pt called to advise Tammy that he picked up application from our office and will take it to Dr. Rosezella [place] office on Friday. Please call pt back if needed.

## 2022-04-28 NOTE — Progress Notes (Signed)
Cardiology Clinic Note   Patient Name: Brandon [name] Date of Encounter: 04/30/2022  Primary Care Provider:  Debbrah Alar, NP Primary Cardiologist:  Minus Breeding, MD  Patient Profile    Brandon [name] 82 year old male presents to the clinic today for follow-up evaluation of his nonischemic cardiomyopathy, cardiac event monitor, and essential hypertension.  Past Medical History    Past Medical History:  Diagnosis Date   Anemia    Asthma    Hx of childhood asthma, disappeared for a while, then resurfaced 6-7 years ago.    Cardiomyopathy    with a negative cardiac catheterization in the past. 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  He was seen by Dr. Percival Spanish on 11/06/2021.  He remained stable from a cardiac standpoint.  He was able to do some golfing and play 18 holes.  He denied new shortness of breath, PND or orthopnea.  He denied palpitations, presyncope and syncope.  He had no new weight gain.   He was admitted to the hospital on 04/18/2022 and discharged on 04/20/2023.  He was evaluated for dizziness.  His brain MRI showed focal area of signal abnormality in the ventral right thalamus which was felt to be likely due to recent subacute infarct.  Stroke team recommended  continued aspirin, cardiac event monitor, and starting rosuvastatin 10 mg daily.  Starting Plavix was not recommended.  Follow-up MRI of his brain was planned.  He presents to the clinic today for follow-up evaluation and states he been monitoring when he has increased heart rate or shortness of breath.  He has pushed his cardiac event monitor around 3 times.  He does not remember when he placed the device.  We reviewed his recent hospitalization.  He does note some continued fatigue since being discharged from the hospital.  He and his wife expressed understanding.  He has follow-up planned with neurology.  He brings in patient assistance paperwork for resubmission for his Entresto.  We will give him samples today.  I will plan follow-up in around 6 weeks to review his cardiac event monitor results.  I have encouraged him to slowly increase his physical activity and keep his mind active by doing various challenging mental tasks.  Today he denies chest pain, shortness of breath, lower extremity edema, fatigue, palpitations, melena, hematuria, hemoptysis, diaphoresis, weakness, presyncope, syncope, orthopnea, and PND.   Home Medications    Prior to Admission medications   Medication Sig Start Date End Date Taking? Authorizing Provider  albuterol (PROVENTIL) (2.5 MG/3ML) 0.083% nebulizer solution TAKE 3 MLS BY NEBULIZER EVERY 6 HOURS AS NEEDED FOR WHEEZING FOR SHORTNESS OF BREATH Patient taking differently: Take 2.5 mg by nebulization every 6 (six) hours as needed for wheezing or shortness of breath. 02/23/22  Debbrah Alar, NP  albuterol (VENTOLIN HFA) 108 (90 Base) MCG/ACT inhaler Inhale 1-2 puffs into the lungs every 6 (six) hours as needed for wheezing or shortness of breath. 11/03/21   Debbrah Alar, NP  amLODipine (NORVASC) 10 MG tablet Take 1 tablet by mouth once daily 04/15/22   Debbrah Alar, NP  aspirin 81 MG tablet Take 1 tablet (81 mg total) by mouth daily. 04/19/22 05/19/22   Lucienne Minks, MD  b complex vitamins tablet Take 1 tablet by mouth daily. 01/23/19   Debbrah Alar, NP  fluticasone-salmeterol (ADVAIR) 500-50 MCG/ACT AEPB Inhale 1 puff into the lungs in the morning and at bedtime. 12/18/21   Debbrah Alar, NP  furosemide (LASIX) 20 MG tablet Take 1 tablet (20 mg total) by mouth daily. Patient taking differently: Take 20 mg by mouth daily as needed for fluid. 03/30/22   Debbrah Alar, NP  leflunomide (ARAVA) 20 MG tablet Take 20 mg by mouth daily. 05/31/19   [provider]  metoprolol succinate (TOPROL-XL) 25 MG 24 hr tablet Take 1 tablet (25 mg total) by mouth daily. 01/29/22   Debbrah Alar, NP  montelukast (SINGULAIR) 10 MG tablet Take 1 tablet (10 mg total) by mouth at bedtime. 11/03/21   Debbrah Alar, NP  rosuvastatin (CRESTOR) 10 MG tablet Take 1 tablet (10 mg total) by mouth daily. 04/20/22 05/20/22  Lucienne Minks, MD  sacubitril-valsartan (ENTRESTO) 97-103 MG Take 1 tablet by mouth 2 (two) times daily. (For medication assistance program) 11/20/21   Minus Breeding, MD  spironolactone (ALDACTONE) 25 MG tablet Take 1 tablet (25 mg total) by mouth daily. 11/03/21   Debbrah Alar, NP  tamsulosin (FLOMAX) 0.4 MG CAPS capsule Take 1 capsule (0.4 mg total) by mouth daily. 11/03/21   Debbrah Alar, NP    Family History    Family History  Problem Relation Age of Onset   Multiple myeloma Father    Lupus Sister        died at 78   Diabetes Mellitus II Sister        died from covid-19   Neuropathy Sister    Asthma Daughter    Colon cancer Neg Hx    Esophageal cancer Neg Hx    He indicated that his mother is deceased. He indicated that the status of his father is unknown and reported the following: cancer-multiple myeloma. He indicated that only one of his three sisters is alive. He indicated that his daughter is alive. He indicated that the status of his neg hx is unknown.  Social History    Social History    Socioeconomic History   Marital status: Married    Spouse name: [name]   Number of children: 3   Years of education: Not on file   Highest education level: Not on file  Occupational History   Occupation: retired  Tobacco Use   Smoking status: Former   Smokeless tobacco: Never   Tobacco comments:    quit smoling in 06-Jun-1988. started when 18, 1 ppd  Vaping Use   Vaping Use: Never used  Substance and Sexual Activity   Alcohol use: Yes    Comment: occ   Drug use: Never   Sexual activity: Not on file  Other Topics Concern   Not on file  Social History Narrative   Grew up in [place], attended [employer]. First wife died of breast ca in 06/06/97. 3 children. Remarried- 8 years. 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  Pt signed designated party release granting access to [facility] to his wife Tonia G[place]t. Detailed message may be left on home or cell phone. Shanon Payor August 13, 2009 11:34 am.    Social Determinants of Health   Financial Resource Strain: Medium Risk (03/19/2021)   Overall Financial Resource Strain (CARDIA)    Difficulty of Paying Living Expenses: Somewhat hard  Food Insecurity: No Food Insecurity (04/19/2022)   Hunger Vital Sign    Worried About Running Out of Food in the Last Year: Never true    Ran Out of Food in the Last Year: Never true  Transportation Needs: No Transportation Needs (04/20/2022)   PRAPARE - Hydrologist (Medical): No    Lack of Transportation (Non-Medical): No  Physical Activity: Insufficiently Active (07/25/2020)   Exercise Vital Sign    Days of Exercise per Week: 5 days    Minutes of Exercise per Session: 10 min  Stress: No Stress Concern Present (05/28/2021)   [place]    Feeling of Stress : Not at all  Social Connections: [place] (05/28/2021)   Social Connection and Isolation Panel [NHANES]    Frequency of Communication  with Friends and Family: More than three times a week    Frequency of Social Gatherings with Friends and Family: Three times a week    Attends Religious Services: More than 4 times per year    Active Member of Clubs or Organizations: Yes    Attends Archivist Meetings: More than 4 times per year    Marital Status: Married  Human resources officer Violence: Not At Risk (04/19/2022)   Humiliation, Afraid, Rape, and Kick questionnaire    Fear of Current or Ex-Partner: No    Emotionally Abused: No    Physically Abused: No    Sexually Abused: No     Review of Systems    General:  No chills, fever, night sweats or weight changes.  Cardiovascular:  No chest pain, dyspnea on exertion, edema, orthopnea, palpitations, paroxysmal nocturnal dyspnea. Dermatological: No rash, lesions/masses Respiratory: No cough, dyspnea Urologic: No hematuria, dysuria Abdominal:   No nausea, vomiting, diarrhea, bright red blood per rectum, melena, or hematemesis Neurologic:  No visual changes, wkns, changes in mental status. All other systems reviewed and are otherwise negative except as noted above.  Physical Exam    VS:  BP 116/76   Pulse 84   Ht 5' 11"$  (1.803 m)   Wt 215 lb (97.5 kg)   SpO2 (!) 84%   BMI 29.99 kg/m  , BMI Body mass index is 29.99 kg/m. GEN: Well nourished, well developed, in no acute distress. HEENT: normal. Neck: Supple, no JVD, carotid bruits, or masses. Cardiac: RRR, no murmurs, rubs, or gallops. No clubbing, cyanosis, edema.  Radials/DP/PT 2+ and equal bilaterally.  Respiratory:  Respirations regular and unlabored, clear to auscultation bilaterally. GI: Soft, nontender, nondistended, BS + x 4. MS: no deformity or atrophy. Skin: warm and dry, no rash. Neuro:  Strength and sensation are intact. Psych: Normal affect.  Accessory Clinical Findings    Recent Labs: 03/22/2022: ALT 10; Pro B Natriuretic peptide (BNP) 1,464.0 04/18/2022: TSH 0.862 04/19/2022: BUN 10; Creatinine, Ser  1.31; Hemoglobin 11.7; Magnesium 1.9; Platelets 196; Potassium 3.8; Sodium 140   Recent Lipid Panel    Component Value Date/Time   CHOL 188 04/18/2022 1254   TRIG 59 04/18/2022 1254   HDL 51 04/18/2022 1254   CHOLHDL 3.7 04/18/2022 1254  VLDL 12 04/18/2022 1254   LDLCALC 125 (H) 04/18/2022 1254         ECG personally reviewed by me today-none today.  Echocardiogram 04/18/2022  IMPRESSIONS     1. Left ventricular ejection fraction, by estimation, is 45 to 50%. The  left ventricle has mildly decreased function. The left ventricle  demonstrates global hypokinesis. Left ventricular diastolic parameters are  indeterminate.   2. Right ventricular systolic function is normal. The right ventricular  size is normal.   3. Left atrial size was moderately dilated.   4. No evidence of mitral valve regurgitation.   5. The aortic valve is grossly normal. Aortic valve regurgitation is not  visualized.   6. The inferior vena cava is normal in size with greater than 50%  respiratory variability, suggesting right atrial pressure of 3 mmHg.   FINDINGS   Left Ventricle: Left ventricular ejection fraction, by estimation, is 45  to 50%. The left ventricle has mildly decreased function. The left  ventricle demonstrates global hypokinesis. The left ventricular internal  cavity size was normal in size. There is   no left ventricular hypertrophy. Left ventricular diastolic parameters  are indeterminate.   Right Ventricle: The right ventricular size is normal. Right ventricular  systolic function is normal.   Left Atrium: Left atrial size was moderately dilated.   Right Atrium: Right atrial size was normal in size.   Pericardium: There is no evidence of pericardial effusion.   Mitral Valve: No evidence of mitral valve regurgitation.   Tricuspid Valve: Tricuspid valve regurgitation is not demonstrated.   Aortic Valve: The aortic valve is grossly normal. Aortic valve  regurgitation is not  visualized.   Pulmonic Valve: Pulmonic valve regurgitation is not visualized.   Aorta: The aortic root and ascending aorta are structurally normal, with  no evidence of dilitation.   Venous: The inferior vena cava is normal in size with greater than 50%  respiratory variability, suggesting right atrial pressure of 3 mmHg.   IAS/Shunts: No atrial level shunt detected by color flow Doppler.    Cardiac event monitor 04/20/2022 Results not yet available.   Assessment & Plan   1.  CVA-denies further episodes of dizziness.  Neurologically intact.  Presented to the emergency department with dizziness and underwent brain MRI which showed recent subacute infarct.  Cardiac event monitor was ordered.  Patient currently wearing cardiac event monitor.  Will await results Continue rosuvastatin, aspirin Following with neurology  Chronic systolic CHF-euvolemic today.  No increased DOE or activity intolerance. Weight stable. Continue furosemide, metoprolol, Entresto, spironolactone Heart healthy low-sodium diet-salty 6 given Entresto samples given  Essential hypertension-BP today 116/76. Continue amlodipine, metoprolol, spironolactone, Entresto Maintain blood pressure log  PVCs-heart rate today 84.  Cardiac unaware. Continue metoprolol Avoid triggers Will await results of cardiac event monitor  OSA-reports compliance with CPAP.  Waking up well rested. Continue CPAP use  Obesity-weight today 215 pounds. Reduce calorie diet Continue weight loss  Disposition: Follow-up with Dr. Percival Spanish i 6 weeks.  Jossie Ng. Taquan Bralley NP-C     04/30/2022, 10:24 AM [place] [address] Office [phone] Fax [phone]    I spent 14 minutes examining this patient, reviewing medications, and using patient centered shared decision making involving her cardiac care.  Prior to her visit I spent greater than 20 minutes reviewing her past medical history,   medications, and prior cardiac tests.

## 2022-04-30 ENCOUNTER — Ambulatory Visit: Payer: Medicare Other | Attending: General Practice | Admitting: General Practice

## 2022-04-30 ENCOUNTER — Encounter: Payer: Self-pay | Admitting: General Practice

## 2022-04-30 ENCOUNTER — Other Ambulatory Visit: Payer: Self-pay | Admitting: *Deleted

## 2022-04-30 ENCOUNTER — Ambulatory Visit: Payer: Medicare Other

## 2022-04-30 VITALS — BP 116/76 | HR 84 | Ht 71.0 in | Wt 215.0 lb

## 2022-04-30 DIAGNOSIS — G4733 Obstructive sleep apnea (adult) (pediatric): Secondary | ICD-10-CM | POA: Diagnosis not present

## 2022-04-30 DIAGNOSIS — I639 Cerebral infarction, unspecified: Secondary | ICD-10-CM | POA: Diagnosis not present

## 2022-04-30 DIAGNOSIS — I493 Ventricular premature depolarization: Secondary | ICD-10-CM | POA: Diagnosis not present

## 2022-04-30 DIAGNOSIS — I5022 Chronic systolic (congestive) heart failure: Secondary | ICD-10-CM

## 2022-04-30 DIAGNOSIS — I10 Essential (primary) hypertension: Secondary | ICD-10-CM

## 2022-04-30 MED ORDER — SACUBITRIL-VALSARTAN 97-103 MG PO TABS: 1.0000 | ORAL_TABLET | Freq: Two times a day (BID) | ORAL | 3 refills | Status: DC

## 2022-04-30 NOTE — Patient Instructions (Signed)
Medication Instructions:  The current medical regimen is effective;  continue present plan and medications as directed. Please refer to the Current Medication list given to you today.  *If you need a refill on your cardiac medications before your next appointment, please call your pharmacy*  Lab Work: NONE If you have labs (blood work) drawn today and your tests are completely normal, you will receive your results only by: [place] (if you have MyChart) OR A paper copy in the mail If you have any lab test that is abnormal or we need to change your treatment, we will call you to review the results.  Testing/Procedures: NONE   Follow-Up: At [facility], you and your health needs are our priority.  As part of our continuing mission to provide you with exceptional heart care, we have created designated Provider Care Teams.  These Care Teams include your primary Cardiologist (physician) and Advanced Practice Providers (APPs -  Physician Assistants and Nurse Practitioners) who all work together to provide you with the care you need, when you need it.  We recommend signing up for the patient portal called "MyChart".  Sign up information is provided on this After Visit Summary.  MyChart is used to connect with patients for Virtual Visits (Telemedicine).  Patients are able to view lab/test results, encounter notes, upcoming appointments, etc.  Non-urgent messages can be sent to your provider as well.   To learn more about what you can do with MyChart, go to [email or web address].    Your next appointment:   AFTER MONITOR COMPLETION-  IN ABOUT 6 WEEKS  Provider:   Minus Breeding, MD OR Coletta Memos, FNP-C    Other Instructions

## 2022-05-03 DIAGNOSIS — G4733 Obstructive sleep apnea (adult) (pediatric): Secondary | ICD-10-CM | POA: Diagnosis not present

## 2022-05-04 ENCOUNTER — Inpatient Hospital Stay: Payer: Medicare Other | Admitting: Internal Medicine

## 2022-05-05 ENCOUNTER — Encounter: Payer: Self-pay | Admitting: *Deleted

## 2022-05-10 ENCOUNTER — Ambulatory Visit (INDEPENDENT_AMBULATORY_CARE_PROVIDER_SITE_OTHER): Payer: Medicare Other | Admitting: Pharmacist

## 2022-05-10 VITALS — BP 112/60 | HR 68 | Ht 71.0 in | Wt 217.8 lb

## 2022-05-10 DIAGNOSIS — I639 Cerebral infarction, unspecified: Secondary | ICD-10-CM

## 2022-05-10 DIAGNOSIS — I10 Essential (primary) hypertension: Secondary | ICD-10-CM

## 2022-05-10 DIAGNOSIS — I5022 Chronic systolic (congestive) heart failure: Secondary | ICD-10-CM

## 2022-05-10 MED ORDER — ROSUVASTATIN CALCIUM 10 MG PO TABS: 10.0000 mg | ORAL_TABLET | Freq: Every day | ORAL | 1 refills | Status: DC

## 2022-05-10 NOTE — Patient Instructions (Addendum)
[name] It was a pleasure speaking with you today.   Blood pressure goal is 110 to 130 / 60 to 90.    As always if you have any questions or concerns especially regarding medications, please feel free to contact me either at the phone number below or with a MyChart message.   Keep up the good work!  Cherre Robins, PharmD Clinical Pharmacist [place] [phone] (direct line)  [phone] (main office number)   Heart Failure Action Plan A heart failure action plan helps you understand what to do when you have symptoms of heart failure. Your action plan is a color-coded plan that lists the symptoms to watch for and indicates what actions to take. If you have symptoms in the red zone, you need medical care right away. If you have symptoms in the yellow zone, you are having problems. If you have symptoms in the green zone, you are doing well. Follow the plan that was created by you and your health care provider. Review your plan each time you visit your health care provider. Red zone These signs and symptoms mean you should get medical help right away: You have trouble breathing when resting. You have a dry cough that is getting worse. You have swelling or pain in your legs or abdomen that is getting worse. You suddenly gain more than 2-3 lb (0.9-1.4 kg) in 24 hours, or more than 5 lb (2.3 kg) in a week. This amount may be more or less depending on your condition. You have trouble staying awake or you feel confused. You have chest pain. You do not have an appetite. You pass out. You have worsening sadness or depression. If you have any of these symptoms, call your local emergency services (911 in the [place] right away. Do not drive yourself to the hospital. Yellow zone These signs and symptoms mean your condition may be getting worse and you should make some changes: You have trouble breathing when you are active, or you need to sleep with your  head raised on extra pillows to help you breathe. You have swelling in your legs or abdomen. You gain 2-3 lb (0.9-1.4 kg) in 24 hours, or 5 lb (2.3 kg) in a week. This amount may be more or less depending on your condition. You get tired easily. You have trouble sleeping. You have a dry cough. If you have any of these symptoms: Contact your health care provider within the next day. Your health care provider may adjust your medicines. Green zone These signs mean you are doing well and can continue what you are doing: You do not have shortness of breath. You have very little swelling or no new swelling. Your weight is stable (no gain or loss). You have a normal activity level. You do not have chest pain or any other new symptoms. Follow these instructions at home: Take over-the-counter and prescription medicines only as told by your health care provider. Weigh yourself daily. Your target weight is __________ lb (__________ kg). Call your health care provider if you gain more than __________ lb (__________ kg) in 24 hours, or more than __________ lb (__________ kg) in a week. Health care provider name: _____________________________________________________ Health care provider phone number: _____________________________________________________ Eat a heart-healthy diet. Work with a diet and nutrition specialist (dietitian) to create an eating plan that is best for you. Keep all follow-up visits. This is important. Where to find more information American Heart Association: Summary A heart failure  action plan helps you understand what to do when you have symptoms of heart failure. Follow the action plan that was created by you and your health care provider. Get help right away if you have any symptoms in the red zone. This information is not intended to replace advice given to you by your health care provider. Make sure you discuss any questions you have with your health care  provider. Document Revised: 06/09/2021 Document Reviewed: 10/15/2019 Elsevier Patient Education  [place].

## 2022-05-10 NOTE — Progress Notes (Signed)
Pharmacy Note  05/10/2022 Name: Brandon [name] MRN: [id number] DOB: 1940/09/21  Subjective: Brandon [name] is a 82 y.o. year old male who is a primary care patient of Debbrah Alar, NP. Clinical Pharmacist Practitioner referral was placed to assist with medication management.    Engaged with patient by telephone for follow up visit today.  CHF: Current treatment - Entresto 97/'109mg'$  twice a day, fuorsemide '20mg'$  daily if needed, metoprolol succinate ER '25mg'$  daily, spironolactone '25mg'$  daily.  Patient denied shortness of breath or edema.  Weight much lower after hospitalization.  Patient is in process of applying for [facility] medication assistance program. Per cardio notes they need additional income verification info.   Wt Readings from Last 3 Encounters:  05/10/22 217 lb 12.8 oz (98.8 kg)  04/30/22 215 lb (97.5 kg)  04/21/22 217 lb (98.4 kg)    Hypertension:  Taking Entresto, metoprolol ER '25mg'$  daily and amlodipine '10mg'$  daily.  Blood pressure at goal in office today  CVA: Patient was hospitalized 04/18/22 to 04/17/2022 for CVA after he presented to ED with dizziness. Brain MRI showed a focal area of signal abormaility in the ventral R thalamus (likely recent subacute infarct). Radiologist recommended a follow up enhanced brain MRI in a few weeks. The stroke team recommended to continue ASA 81 mg PO daily, a Zio patch (placed 04/19/2022) and started crestor 10 mg PO daily.  Stroke team did not recommend plavix at this time. Patient reports he will need new Rx for rosuvastatin to continue.    SDOH (Social Determinants of Health) assessments and interventions performed:  SDOH Interventions    Flowsheet Row Telephone from 04/20/2022 in [place] from 05/28/2021 in [facility] at [place] Management from 04/06/2021 in [facility] at [place] Management from 03/19/2021 in [facility] at [place] Management from 02/04/2021 in [facility] at [place] Management from 11/14/2020 in [facility] at [place] Interventions        Housing Interventions -- Intervention Not Indicated -- -- -- --  Transportation Interventions Intervention Not Indicated  [normally drives self,  wife provides transportation as needed] Intervention Not Indicated Intervention Not Indicated -- -- --  Financial Strain Interventions -- -- -- Other (Comment)  [patient has been approved to received Advair and Entresto from patient assistance programs thru 03/14/2022] Other (Comment)  [completed application for Advair and working on app for MAP for Entresto] Other (Comment)  [patient will complete application for Advair PAP once reaches $600 out of pocket spend require (at $500 currently),  Referral to care coordination to check community resources for assistance with dental costs.]  Stress Interventions -- Intervention Not Indicated -- -- -- --  Social Connections Interventions -- Intervention Not Indicated -- -- -- --        Objective: Review of patient status, including review of consultants reports, laboratory and other test data, was performed as part of comprehensive.  Lab Results  Component Value Date   CREATININE 1.31 (H) 04/19/2022   CREATININE 1.28 (H) 04/17/2022   CREATININE 1.37 03/30/2022    Lab Results  Component Value Date   HGBA1C 5.1 04/18/2022       Component Value Date/Time   CHOL 188 04/18/2022 1254   TRIG 59 04/18/2022 1254   HDL 51 04/18/2022  1254   CHOLHDL 3.7 04/18/2022 1254   VLDL 12 04/18/2022 1254   LDLCALC 125 (H) 04/18/2022 1254     Clinical ASCVD: Yes  The ASCVD Risk score (Arnett DK, et al., 2019) failed to calculate for the following reasons:   The 2019 ASCVD risk score is only valid for ages  70 to 30   The patient has a prior MI or stroke diagnosis    BP Readings from Last 3 Encounters:  05/10/22 112/60  04/30/22 116/76  04/21/22 (!) 112/58     Allergies  Allergen Reactions   Ace Inhibitors Cough   Isosorb Dinitrate-Hydralazine Other (See Comments)    REACTION: dizziness/hypotensive    Medications Reviewed Today     Reviewed by Cherre Robins, RPH-CPP (Pharmacist) on 05/10/22 at 1445  Med List Status: <None>   Medication Order Taking? Sig Documenting Provider Last Dose Status Informant  albuterol (PROVENTIL) (2.5 MG/3ML) 0.083% nebulizer solution [id number] No TAKE 3 MLS BY NEBULIZER EVERY 6 HOURS AS NEEDED FOR WHEEZING FOR SHORTNESS OF BREATH  Patient not taking: Reported on 05/10/2022   Debbrah Alar, NP Not Taking Active Self  albuterol (VENTOLIN HFA) 108 (90 Base) MCG/ACT inhaler [id number] Yes Inhale 1-2 puffs into the lungs every 6 (six) hours as needed for wheezing or shortness of breath. Debbrah Alar, NP Taking Active Self  amLODipine (NORVASC) 10 MG tablet [id number] Yes Take 1 tablet by mouth once daily Debbrah Alar, NP Taking Active Self  aspirin 81 MG tablet [id number] Yes Take 1 tablet (81 mg total) by mouth daily. Lucienne Minks, MD Taking Active   b complex vitamins tablet [id number] Yes Take 1 tablet by mouth daily. Debbrah Alar, NP Taking Active Self  fluticasone-salmeterol (ADVAIR) 500-50 MCG/ACT AEPB [id number] Yes Inhale 1 puff into the lungs in the morning and at bedtime. Debbrah Alar, NP Taking Active Self  furosemide (LASIX) 20 MG tablet [id number] Yes Take 1 tablet (20 mg total) by mouth daily.  Patient taking differently: Take 20 mg by mouth daily as needed for fluid.   Debbrah Alar, NP Taking Active Self           Med Note (WHITE, Zandra Abts Apr 18, 2022  6:23 AM) Evelina Bucy for 3 days last week  leflunomide (ARAVA) 20 MG tablet [id number] Yes Take 20 mg by mouth daily. [provider] Taking Active Self   metoprolol succinate (TOPROL-XL) 25 MG 24 hr tablet [id number] Yes Take 1 tablet (25 mg total) by mouth daily. Debbrah Alar, NP Taking Active Self  montelukast (SINGULAIR) 10 MG tablet [id number] Yes Take 1 tablet (10 mg total) by mouth at bedtime. Debbrah Alar, NP Taking Active Self  rosuvastatin (CRESTOR) 10 MG tablet [id number] Yes Take 1 tablet (10 mg total) by mouth daily. Lucienne Minks, MD Taking Active            Med Note Barbaraann Boys May 10, 2022  2:43 PM)    sacubitril-valsartan (ENTRESTO) 97-103 MG [id number] Yes Take 1 tablet by mouth 2 (two) times daily. (For medication assistance program) Minus Breeding, MD Taking Active   spironolactone (ALDACTONE) 25 MG tablet [id number] Yes Take 1 tablet (25 mg total) by mouth daily. Debbrah Alar, NP Taking Active Self  tamsulosin (FLOMAX) 0.4 MG CAPS capsule [id number] Yes Take 1 capsule (0.4 mg total) by mouth daily. 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   Coronary artery calcification 03/30/2022   SOB (shortness of breath) 03/22/2022   Nasal mass 06/02/2021   B12 deficiency 08/13/2020   Rheumatoid arthritis ([place]) 08/12/2020   BPH (benign prostatic hyperplasia) 99991111   Folic acid deficiency 99991111   Educated about COVID-19 virus infection 10/17/2019   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  Discussed income information needed for [facility] medication assistance program.  Continue to weigh daily - report weight gain of more than 3 lbs in 24 hours or 5 lbs in 1 week.  Hypertension / CVA:  Check blood pressure at home once daily and record.  Continue current medications to lower blood pressure.  Continue rosuvastatin '10mg'$  daily - goal LDL < 55. Sent updated Rx to [facility] Continue aspirin '81mg'$  daily  Medication Assistance:  Application for Entresto  medication assistance program in process.    Medication management:  Reviewed and updated medication list Reviewed refill history and adherence Appointment made for next B12 injection with office nurse - 05/12/2022.   Follow Up:  Telephone follow up appointment with care management team member scheduled for:  1 month to review meds and follow up HTN   Cherre Robins, PharmD Clinical Pharmacist [place] [phone]

## 2022-05-12 ENCOUNTER — Ambulatory Visit: Payer: Medicare Other

## 2022-05-14 ENCOUNTER — Telehealth: Payer: Self-pay | Admitting: Internal Medicine

## 2022-05-14 NOTE — Telephone Encounter (Signed)
Pt states pharmacy is requesting a PA for rosuvastatin.   [facility] [address] [place], [place] - [address], [place] [address] Phone: [phone]  Fax: [phone]

## 2022-05-17 NOTE — Telephone Encounter (Signed)
PA initiated via Covermymeds; KEY: [id number].   This medication or product is on your plan's list of covered drugs. Prior authorization is not required at this time. If your pharmacy has questions regarding the processing of your prescription, please have them call the [facility] pharmacy help desk at [phone]. **Please note: This request was submitted electronically. Formulary lowering, tiering exception, cost reduction and/or pre-benefit determination review (including prospective Medicare hospice reviews) requests cannot be requested using this method of submission. Providers contact us at [phone] for further assistance.

## 2022-05-20 ENCOUNTER — Ambulatory Visit (HOSPITAL_COMMUNITY)
Admission: RE | Admit: 2022-05-20 | Discharge: 2022-05-20 | Disposition: A | Discharge status: Home / Self Care | Payer: Medicare Other | Source: Ambulatory Visit | Attending: Internal Medicine | Admitting: Internal Medicine

## 2022-05-20 ENCOUNTER — Encounter (HOSPITAL_COMMUNITY): Payer: Self-pay

## 2022-05-20 ENCOUNTER — Telehealth: Payer: Self-pay | Admitting: Family

## 2022-05-20 ENCOUNTER — Telehealth: Payer: Self-pay

## 2022-05-20 DIAGNOSIS — R9089 Other abnormal findings on diagnostic imaging of central nervous system: Secondary | ICD-10-CM

## 2022-05-20 NOTE — Telephone Encounter (Signed)
Contacted Brandon [name] to schedule their annual wellness visit. Call back at later date: 05/31/2022  Sherol Dade; [facility] Direct Dial: [phone]

## 2022-05-20 NOTE — Telephone Encounter (Signed)
Pt called- he was scheduled for an MRI today (ordered by Dr. Mickeal Skinner) and got there but was to claustrophobic to continue. I informed him that he should contact Dr. Renda Rolls office to see how to proceed. Pt verbalized understanding.

## 2022-05-24 ENCOUNTER — Inpatient Hospital Stay: Payer: Medicare Other | Admitting: Internal Medicine

## 2022-05-24 ENCOUNTER — Inpatient Hospital Stay: Payer: Medicare Other

## 2022-05-24 ENCOUNTER — Telehealth: Payer: Self-pay | Admitting: *Deleted

## 2022-05-24 MED ORDER — LORAZEPAM 1 MG PO TABS: 1.0000 mg | ORAL_TABLET | Freq: Three times a day (TID) | ORAL | 0 refills | Status: DC | PRN

## 2022-05-24 NOTE — Telephone Encounter (Signed)
Patient was unable to proceed with previous MRI due to claustrophobia and has rescheduled but needs Ativan ordered to [facility] on file.    Routing to Dr Mickeal Skinner to please advise.

## 2022-05-24 NOTE — Addendum Note (Signed)
Addended by: Ventura Sellers on: 05/24/2022 03:53 PM   Modules accepted: Orders

## 2022-05-27 ENCOUNTER — Telehealth: Payer: Self-pay | Admitting: Family

## 2022-05-27 ENCOUNTER — Ambulatory Visit (HOSPITAL_COMMUNITY)
Admission: RE | Admit: 2022-05-27 | Discharge: 2022-05-27 | Disposition: A | Discharge status: Home / Self Care | Payer: Medicare Other | Source: Ambulatory Visit | Attending: Internal Medicine | Admitting: Internal Medicine

## 2022-05-27 DIAGNOSIS — G9389 Other specified disorders of brain: Secondary | ICD-10-CM | POA: Diagnosis not present

## 2022-05-27 DIAGNOSIS — R9089 Other abnormal findings on diagnostic imaging of central nervous system: Secondary | ICD-10-CM | POA: Diagnosis not present

## 2022-05-27 MED ORDER — GADOBUTROL 1 MMOL/ML IV SOLN
10.0000 mL | Freq: Once | INTRAVENOUS | Status: AC | PRN
  Administered 2022-05-27: 10 mL via INTRAVENOUS

## 2022-05-27 NOTE — Telephone Encounter (Signed)
Please advise pt that I reviewed your recent heart monitor results with cardiology.  You did have some SVT (episodes of increased heart rate).  Cardiology would like for you to keep your appointment on 3/22 with Dr. Percival Spanish and recommended avoiding triggers such as caffeine, chocolate, EtOH, dehydration etc.  You may take an half extra dose of metoprolol for sustained palpitations.

## 2022-05-27 NOTE — Telephone Encounter (Signed)
Patient advised of results and provider's comments and recommendations. He verbalized understanding

## 2022-05-31 ENCOUNTER — Inpatient Hospital Stay: Payer: Medicare Other | Attending: Internal Medicine

## 2022-05-31 ENCOUNTER — Other Ambulatory Visit: Payer: Self-pay

## 2022-05-31 ENCOUNTER — Inpatient Hospital Stay: Payer: Medicare Other | Admitting: Internal Medicine

## 2022-05-31 VITALS — BP 125/73 | HR 69 | Temp 97.7°F | Resp 20 | Wt 222.2 lb

## 2022-05-31 DIAGNOSIS — G9389 Other specified disorders of brain: Secondary | ICD-10-CM | POA: Insufficient documentation

## 2022-05-31 DIAGNOSIS — E669 Obesity, unspecified: Secondary | ICD-10-CM | POA: Insufficient documentation

## 2022-05-31 DIAGNOSIS — Z7969 Long term (current) use of other immunomodulators and immunosuppressants: Secondary | ICD-10-CM

## 2022-05-31 DIAGNOSIS — R9089 Other abnormal findings on diagnostic imaging of central nervous system: Secondary | ICD-10-CM | POA: Insufficient documentation

## 2022-05-31 DIAGNOSIS — I639 Cerebral infarction, unspecified: Secondary | ICD-10-CM | POA: Insufficient documentation

## 2022-05-31 DIAGNOSIS — I10 Essential (primary) hypertension: Secondary | ICD-10-CM

## 2022-05-31 NOTE — Progress Notes (Signed)
[facility] [place], [place] [address] [phone]   New Patient Evaluation  Date of Service: 05/31/22 Patient Name: Brandon [name] Patient MRN: [id number] Patient DOB: [birth year] Provider: Ventura Sellers, MD  Identifying Statement:  Brandon [name] is a 82 y.o. male with Abnormal brain MRI  Cerebrovascular accident (CVA), unspecified mechanism [facility]) who presents for initial consultation and evaluation  Referring Provider: Lorenza Chick, MD [address] [place],  [place] [address]  History of Present Illness: The patient's records from the referring physician were obtained and reviewed and the patient interviewed to confirm this HPI.  Brandon [name] presents to follow up after recent brain MRI.  He presented to the hospital initially last month for stroke evaluation, given new onset dizziness and vertigo.  Brain MRI demonstrated right thalamic lesion, could not fully rule out occult neoplasm.  Since discharge he has been dosing aspirin, crestor, no recurrence of symptoms.  Medications: Current Outpatient Medications on File Prior to Visit  Medication Sig Dispense Refill   albuterol (PROVENTIL) (2.5 MG/3ML) 0.083% nebulizer solution TAKE 3 MLS BY NEBULIZER EVERY 6 HOURS AS NEEDED FOR WHEEZING FOR SHORTNESS OF BREATH 150 mL 3   albuterol (VENTOLIN HFA) 108 (90 Base) MCG/ACT inhaler Inhale 1-2 puffs into the lungs every 6 (six) hours as needed for wheezing or shortness of breath. 8 g 0   amLODipine (NORVASC) 10 MG tablet Take 1 tablet by mouth once daily 90 tablet 0   b complex vitamins tablet Take 1 tablet by mouth daily.     fluticasone-salmeterol (ADVAIR) 500-50 MCG/ACT AEPB Inhale 1 puff into the lungs in the morning and at bedtime. 60 each 6   furosemide (LASIX) 20 MG tablet Take 1 tablet (20 mg total) by mouth daily. (Patient taking differently: Take 20 mg by mouth daily as needed for fluid.) 30 tablet 3    leflunomide (ARAVA) 20 MG tablet Take 20 mg by mouth daily.     LORazepam (ATIVAN) 1 MG tablet Take 1 tablet (1 mg total) by mouth every 8 (eight) hours as needed for anxiety (mri claustrophobia). 10 tablet 0   metoprolol succinate (TOPROL-XL) 25 MG 24 hr tablet Take 1 tablet (25 mg total) by mouth daily. 90 tablet 1   montelukast (SINGULAIR) 10 MG tablet Take 1 tablet (10 mg total) by mouth at bedtime. 90 tablet 1   rosuvastatin (CRESTOR) 10 MG tablet Take 1 tablet (10 mg total) by mouth daily. 90 tablet 1   sacubitril-valsartan (ENTRESTO) 97-103 MG Take 1 tablet by mouth 2 (two) times daily. (For medication assistance program) 180 tablet 3   spironolactone (ALDACTONE) 25 MG tablet Take 1 tablet (25 mg total) by mouth daily. 90 tablet 1   tamsulosin (FLOMAX) 0.4 MG CAPS capsule Take 1 capsule (0.4 mg total) by mouth daily. 90 capsule 1   No current facility-administered medications on file prior to visit.    Allergies:  Allergies  Allergen Reactions   Ace Inhibitors Cough   Isosorb Dinitrate-Hydralazine Other (See Comments)    REACTION: dizziness/hypotensive   Past Medical History:  Past Medical History:  Diagnosis Date   Anemia    Asthma    Hx of childhood asthma, disappeared for a while, then resurfaced 6-7 years ago.    Cardiomyopathy    with a negative cardiac catheterization in the past. (EF appriximately 40-45%)    Heme positive stool 02/01/2019   HTN (hypertension)  x 30 years   Obesity, unspecified    Pneumonia    Sleep apnea    CPAP   Unspecified disorder resulting from impaired renal function    Past Surgical History:  Past Surgical History:  Procedure Laterality Date   None     Social History:  Social History   Socioeconomic History   Marital status: Married    Spouse name: Brandon [name]   Number of children: 3   Years of education: Not on file   Highest education level: Not on file  Occupational History   Occupation: retired  Tobacco Use   Smoking status:  Former   Smokeless tobacco: Never   Tobacco comments:    quit smoling in 07/11/1988. started when 18, 1 ppd  Vaping Use   Vaping Use: Never used  Substance and Sexual Activity   Alcohol use: Yes    Comment: occ   Drug use: Never   Sexual activity: Not on file  Other Topics Concern   Not on file  Social History Narrative   Grew up in [place], attended [employer]. First wife died of breast ca in 07/11/97. 3 children. Remarried- 8 years. 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    Pt signed designated party release granting access to [facility] to his wife Tonia Ghent. Detailed message may be left on home or cell phone. Shanon Payor August 13, 2009 11:34 am.    Social Determinants of Health   Financial Resource Strain: Medium Risk (03/19/2021)   Overall Financial Resource Strain (CARDIA)    Difficulty of Paying Living Expenses: Somewhat hard  Food Insecurity: No Food Insecurity (04/19/2022)   Hunger Vital Sign    Worried About Running Out of Food in the Last Year: Never true    Ran Out of Food in the Last Year: Never true  Transportation Needs: No Transportation Needs (04/20/2022)   PRAPARE - Hydrologist (Medical): No    Lack of Transportation (Non-Medical): No  Physical Activity: Insufficiently Active (07/25/2020)   Exercise Vital Sign    Days of Exercise per Week: 5 days    Minutes of Exercise per Session: 10 min  Stress: No Stress Concern Present (05/28/2021)   [place]    Feeling of Stress : Not at all  Social Connections: [employer] (05/28/2021)   Social Connection and Isolation Panel [NHANES]    Frequency of Communication with Friends and Family: More than three times a week    Frequency of Social Gatherings with Friends and Family: Three times a week    Attends Religious Services: More than 4 times per year    Active Member of Clubs or Organizations: Yes    Attends  Archivist Meetings: More than 4 times per year    Marital Status: Married  Human resources officer Violence: Not At Risk (04/19/2022)   Humiliation, Afraid, Rape, and Kick questionnaire    Fear of Current or Ex-Partner: No    Emotionally Abused: No    Physically Abused: No    Sexually Abused: No   Family History:  Family History  Problem Relation Age of Onset   Multiple myeloma Father    Lupus Sister        died at 60   Diabetes Mellitus II Sister        died from covid-19   Neuropathy Sister    Asthma Daughter    Colon cancer Neg Hx  Esophageal cancer Neg Hx     Review of Systems: Constitutional: Doesn't report fevers, chills or abnormal weight loss Eyes: Doesn't report blurriness of vision Ears, nose, mouth, throat, and face: Doesn't report sore throat Respiratory: Doesn't report cough, dyspnea or wheezes Cardiovascular: Doesn't report palpitation, chest discomfort  Gastrointestinal:  Doesn't report nausea, constipation, diarrhea GU: Doesn't report incontinence Skin: Doesn't report skin rashes Neurological: Per HPI Musculoskeletal: Doesn't report joint pain Behavioral/Psych: Doesn't report anxiety  Physical Exam: Vitals:   05/31/22 0913  BP: 125/73  Pulse: 69  Resp: 20  Temp: 97.7 F (36.5 C)  SpO2: 97%   KPS: 80. General: Alert, cooperative, pleasant, in no acute distress Head: Normal EENT: No conjunctival injection or scleral icterus.  Lungs: Resp effort normal Cardiac: Regular rate Abdomen: Non-distended abdomen Skin: No rashes cyanosis or petechiae. Extremities: No clubbing or edema  Neurologic Exam: Mental Status: Awake, alert, attentive to examiner. Oriented to self and environment. Language is fluent with intact comprehension.  Cranial Nerves: Visual acuity is grossly normal. Visual fields are full. Extra-ocular movements intact. No ptosis. Face is symmetric Motor: Tone and bulk are normal. Power is full in both arms and legs. Reflexes are  symmetric, no pathologic reflexes present.  Sensory: Intact to light touch Gait: Normal.   Labs: I have reviewed the data as listed    Component Value Date/Time   NA 140 04/19/2022 0300   NA 139 02/23/2021 1203   K 3.8 04/19/2022 0300   CL 109 04/19/2022 0300   CO2 25 04/19/2022 0300   GLUCOSE 90 04/19/2022 0300   BUN 10 04/19/2022 0300   BUN 15 02/23/2021 1203   CREATININE 1.31 (H) 04/19/2022 0300   CREATININE 1.55 (H) 04/24/2019 1343   CREATININE 1.40 (H) 04/12/2019 1317   CALCIUM 9.3 04/19/2022 0300   PROT 6.7 03/22/2022 1002   ALBUMIN 3.8 03/22/2022 1002   AST 13 03/22/2022 1002   AST 17 04/24/2019 1343   ALT 10 03/22/2022 1002   ALT 12 04/24/2019 1343   ALKPHOS 63 03/22/2022 1002   BILITOT 0.7 03/22/2022 1002   BILITOT 0.7 04/24/2019 1343   GFRNONAA 55 (L) 04/19/2022 0300   GFRNONAA 42 (L) 04/24/2019 1343   GFRNONAA 48 (L) 04/12/2019 1317   GFRAA 44 (L) 11/02/2019 1501   GFRAA 49 (L) 04/24/2019 1343   GFRAA 55 (L) 04/12/2019 1317   Lab Results  Component Value Date   WBC 5.6 04/19/2022   NEUTROABS 4.1 03/22/2022   HGB 11.7 (L) 04/19/2022   HCT 35.3 (L) 04/19/2022   MCV 96.2 04/19/2022   PLT 196 04/19/2022    Imaging:  LONG TERM MONITOR-LIVE TELEMETRY (3-14 DAYS)  Result Date: 05/21/2022 Predominant rhythm is normal sinus Eight runs of tachycardia rauns occurred.  The longest run was 15 beats.  Frequent runs of SVT were noted.  These were at times sutained with the longest run being 1 hour and 21 minutes.  Rate was 135. Frequent isolated SVEs Rare ventricular ectopy.    [place] Clinician Interpretation: I have personally reviewed the radiological images as listed.  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   Etiology is cryptogenic.  Cardiac monitoring did not demonstrate Afib.    Repeat MRI brain was not suggestive of neoplasm.    He will continue ASA, statin.  We counseled him on diet, exercise, sleep for secondary prevention.  We spent twenty additional minutes teaching regarding the natural history, biology, and historical experience in the treatment of neurologic complications of cancer.   We appreciate the opportunity to participate in the care of Pellegrino E [name].  He may follow up as needed.  All questions were answered. The patient knows to call the clinic with any problems, questions or concerns. No barriers to learning were detected.  The total time spent in the encounter was 45 minutes and more than 50% was on counseling and review of test results   Ventura Sellers, MD Medical Director of Neuro-Oncology [facility] at [place] 05/31/22 9:27 AM

## 2022-06-03 NOTE — Progress Notes (Signed)
Cardiology Office Note   Date:  06/04/2022   ID:  Brandon [name], DOB 1941/01/07, MRN [id number]  PCP:  Debbrah Alar, NP  Cardiologist:   Minus Breeding, MD   No chief complaint on file.    History of Present Illness: Brandon [name] is a 82 y.o. male who presents for follow up of cardiomyopathy with an EF in the past of 45 - 50%.  At the last visit I did switch him to [facility].  Since I last saw him in saw him he was in the ED and admitted for one day with dizziness.  He was reported to have a CVA.  MRI demonstrated an abnormality in the ventral right thalamus likely recent subacute infarct.  An event monitor was ordered.   He did have some episodes of SVT but that does not look like atrial fibrillation.  I note that he actually saw neuro oncologist because it was some haziness on the MRI that was determined by this provider not to be malignancy.  Patient says he has no residual from his CVA.  He denies any chest pressure, neck or arm discomfort.  He is not having any new palpitations, presyncope or syncope.  He has had no weight gain or edema.     Past Medical History:  Diagnosis Date   Anemia    Asthma    Hx of childhood asthma, disappeared for a while, then resurfaced 6-7 years ago.    Cardiomyopathy    with a negative cardiac catheterization in the past. 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     Past Surgical History:  Procedure Laterality Date   None       Current Outpatient Medications  Medication Sig Dispense Refill   albuterol (PROVENTIL) (2.5 MG/3ML) 0.083% nebulizer solution TAKE 3 MLS BY NEBULIZER EVERY 6 HOURS AS NEEDED FOR WHEEZING FOR SHORTNESS OF BREATH 150 mL 3   albuterol (VENTOLIN HFA) 108 (90 Base) MCG/ACT inhaler Inhale 1-2 puffs into the lungs every 6 (six)  hours as needed for wheezing or shortness of breath. 8 g 0   amLODipine (NORVASC) 10 MG tablet Take 1 tablet by mouth once daily 90 tablet 0   aspirin EC 81 MG tablet Take 81 mg by mouth daily. Swallow whole.     b complex vitamins tablet Take 1 tablet by mouth daily.     fluticasone-salmeterol (ADVAIR) 500-50 MCG/ACT AEPB Inhale 1 puff into the lungs in the morning and at bedtime. 60 each 6   furosemide (LASIX) 20 MG tablet Take 1 tablet (20 mg total) by mouth daily. (Patient taking differently: Take 20 mg by mouth daily as needed for fluid.) 30 tablet 3   leflunomide (ARAVA) 20 MG tablet Take 20 mg by mouth daily.     LORazepam (ATIVAN) 1 MG tablet Take 1 tablet (1 mg total) by mouth every 8 (eight) hours as needed for anxiety (mri claustrophobia). 10 tablet 0   metoprolol succinate (TOPROL-XL) 25 MG 24 hr tablet Take 1 tablet (25 mg total) by mouth daily. 90 tablet 1   montelukast (SINGULAIR) 10 MG tablet Take 1 tablet (10 mg total) by mouth at bedtime. 90 tablet 1   rosuvastatin (CRESTOR) 10 MG tablet Take 1 tablet (10 mg  total) by mouth daily. 90 tablet 1   sacubitril-valsartan (ENTRESTO) 97-103 MG Take 1 tablet by mouth 2 (two) times daily. (For medication assistance program) 180 tablet 3   spironolactone (ALDACTONE) 25 MG tablet Take 1 tablet (25 mg total) by mouth daily. 90 tablet 1   tamsulosin (FLOMAX) 0.4 MG CAPS capsule Take 1 capsule (0.4 mg total) by mouth daily. 90 capsule 1   No current facility-administered medications for this visit.    Allergies:   Ace inhibitors and Isosorb dinitrate-hydralazine    ROS:  Please see the history of present illness.   Otherwise, review of systems are positive for none.   All other systems are reviewed and negative.    PHYSICAL EXAM: VS:  BP 109/65   Pulse 74   Ht 5\' 11"  (1.803 m)   Wt 219 lb (99.3 kg)   BMI 30.54 kg/m  , BMI Body mass index is 30.54 kg/m. GENERAL:  Well appearing NECK:  No jugular venous distention, waveform within  normal limits, carotid upstroke brisk and symmetric, no bruits, no thyromegaly LUNGS:  Clear to auscultation bilaterally CHEST:  Unremarkable HEART:  PMI not displaced or sustained,S1 and S2 within normal limits, no S3, no S4, no clicks, no rubs, no murmurs ABD:  Flat, positive bowel sounds normal in frequency in pitch, no bruits, no rebound, no guarding, no midline pulsatile mass, no hepatomegaly, no splenomegaly EXT:  2 plus pulses throughout, no edema, no cyanosis no clubbing  EKG:  EKG  not ordered today.   Recent Labs: 03/22/2022: ALT 10; Pro B Natriuretic peptide (BNP) 1,464.0 04/18/2022: TSH 0.862 04/19/2022: BUN 10; Creatinine, Ser 1.31; Hemoglobin 11.7; Magnesium 1.9; Platelets 196; Potassium 3.8; Sodium 140    Lipid Panel    Component Value Date/Time   CHOL 188 04/18/2022 1254   TRIG 59 04/18/2022 1254   HDL 51 04/18/2022 1254   CHOLHDL 3.7 04/18/2022 1254   VLDL 12 04/18/2022 1254   LDLCALC 125 (H) 04/18/2022 1254      Wt Readings from Last 3 Encounters:  06/04/22 219 lb (99.3 kg)  05/31/22 222 lb 3.2 oz (100.8 kg)  05/10/22 217 lb 12.8 oz (98.8 kg)      Other studies Reviewed: Additional studies/ records that were reviewed today include: Hospital records. Review of the above records demonstrates:  Please see elsewhere in the note.     ASSESSMENT AND PLAN:  CVA: He had no residual.  There was no evidence of atrial fibrillation.  Echo was unchanged with a mildly reduced ejection fraction.  He is taking an aspirin.  No change in therapy is indicated.  Will keep an eye out for tachypalpitations but I do not suspect atrial fibrillation at this point.  I would have a low threshold to check further if there are future symptoms.   Chronic systolic heart failure:   He seems to be euvolemic.  His BP will not allow med titration.  No change in therapy.  PVCs:    He also had SVT.  He is not feeling any tachypalpitations.  No change in therapy.  Hypertension:   This is being  managed in the context of treating his CHF   Obstructive sleep apnea:   He uses CPAP.   CKD stage III:   Creatinine was 1.31.  No change in therapy.    Current medicines are reviewed at length with the patient today.  The patient does not have concerns regarding medicines.  The following changes have been made:  None  Labs/ tests ordered today include:  None  No orders of the defined types were placed in this encounter.   Disposition:   FU with me in 6 months.    Signed, Minus Breeding, MD  06/04/2022 5:18 PM    [place]

## 2022-06-04 ENCOUNTER — Encounter: Payer: Self-pay | Admitting: Cardiology

## 2022-06-04 ENCOUNTER — Ambulatory Visit: Payer: Medicare Other | Attending: Cardiology | Admitting: Cardiology

## 2022-06-04 VITALS — BP 109/65 | HR 74 | Ht 71.0 in | Wt 219.0 lb

## 2022-06-04 DIAGNOSIS — I471 Supraventricular tachycardia, unspecified: Secondary | ICD-10-CM | POA: Diagnosis not present

## 2022-06-04 DIAGNOSIS — I5022 Chronic systolic (congestive) heart failure: Secondary | ICD-10-CM | POA: Diagnosis not present

## 2022-06-04 DIAGNOSIS — I10 Essential (primary) hypertension: Secondary | ICD-10-CM

## 2022-06-04 NOTE — Patient Instructions (Signed)
Medication Instructions:  Your physician recommends that you continue on your current medications as directed. Please refer to the Current Medication list given to you today.  *If you need a refill on your cardiac medications before your next appointment, please call your pharmacy*   Follow-Up: At [facility], you and your health needs are our priority.  As part of our continuing mission to provide you with exceptional heart care, we have created designated Provider Care Teams.  These Care Teams include your primary Cardiologist (physician) and Advanced Practice Providers (APPs -  Physician Assistants and Nurse Practitioners) who all work together to provide you with the care you need, when you need it.  We recommend signing up for the patient portal called "MyChart".  Sign up information is provided on this After Visit Summary.  MyChart is used to connect with patients for Virtual Visits (Telemedicine).  Patients are able to view lab/test results, encounter notes, upcoming appointments, etc.  Non-urgent messages can be sent to your provider as well.   To learn more about what you can do with MyChart, go to [email or web address].    Your next appointment:   6 month(s)  Provider:   Minus Breeding, MD

## 2022-06-07 ENCOUNTER — Telehealth: Payer: Self-pay | Admitting: Cardiology

## 2022-06-07 NOTE — Telephone Encounter (Signed)
Patient ask if he is to continue taking increased dose of metoprolol. 05/27/22 he was advised to take increase of half tablet for palpitations.  He asked based on his monitor results and OV on Friday, does he need to continue with the increased dose.  Currently taking metoprolol 25mg  One & Half tablets daily.

## 2022-06-07 NOTE — Telephone Encounter (Signed)
Pt c/o medication issue:  1. Name of Medication: metoprolol succinate (TOPROL-XL) 25 MG 24 hr tablet   2. How are you currently taking this medication (dosage and times per day)?   3. Are you having a reaction (difficulty breathing--STAT)? no  4. What is your medication issue? Calling to see if he still needs to take the additional half a pill. Please advise

## 2022-06-10 NOTE — Telephone Encounter (Signed)
Patient is aware to continuing taking the higher dose of metoprolol. Verbalized understanding

## 2022-06-14 ENCOUNTER — Other Ambulatory Visit: Payer: Self-pay | Admitting: Family

## 2022-06-15 ENCOUNTER — Encounter: Payer: Self-pay | Admitting: Nurse Practitioner

## 2022-06-22 ENCOUNTER — Other Ambulatory Visit: Payer: Self-pay | Admitting: Family

## 2022-06-24 ENCOUNTER — Telehealth: Payer: Self-pay | Admitting: Family

## 2022-06-24 NOTE — Telephone Encounter (Signed)
Contacted Tal[place] E [place] to schedule their annual wellness visit. Appointment made for 07/01/2022.  Verlee Rossetti; Care Guide Ambulatory Clinical Support [place] [facility] Direct Dial: [phone]

## 2022-06-25 DIAGNOSIS — M1612 Unilateral primary osteoarthritis, left hip: Secondary | ICD-10-CM | POA: Diagnosis not present

## 2022-06-29 ENCOUNTER — Telehealth: Payer: Self-pay | Admitting: Family

## 2022-06-29 ENCOUNTER — Ambulatory Visit (INDEPENDENT_AMBULATORY_CARE_PROVIDER_SITE_OTHER): Payer: Medicare Other | Admitting: Family

## 2022-06-29 VITALS — BP 112/76 | HR 65 | Temp 98.4°F | Resp 16 | Wt 219.0 lb

## 2022-06-29 DIAGNOSIS — J45909 Unspecified asthma, uncomplicated: Secondary | ICD-10-CM | POA: Diagnosis not present

## 2022-06-29 DIAGNOSIS — F32A Depression, unspecified: Secondary | ICD-10-CM | POA: Diagnosis not present

## 2022-06-29 DIAGNOSIS — N1831 Chronic kidney disease, stage 3a: Secondary | ICD-10-CM

## 2022-06-29 DIAGNOSIS — M069 Rheumatoid arthritis, unspecified: Secondary | ICD-10-CM

## 2022-06-29 DIAGNOSIS — I10 Essential (primary) hypertension: Secondary | ICD-10-CM

## 2022-06-29 DIAGNOSIS — R195 Other fecal abnormalities: Secondary | ICD-10-CM | POA: Diagnosis not present

## 2022-06-29 DIAGNOSIS — R739 Hyperglycemia, unspecified: Secondary | ICD-10-CM

## 2022-06-29 DIAGNOSIS — I639 Cerebral infarction, unspecified: Secondary | ICD-10-CM | POA: Diagnosis not present

## 2022-06-29 DIAGNOSIS — N401 Enlarged prostate with lower urinary tract symptoms: Secondary | ICD-10-CM

## 2022-06-29 DIAGNOSIS — E538 Deficiency of other specified B group vitamins: Secondary | ICD-10-CM | POA: Diagnosis not present

## 2022-06-29 DIAGNOSIS — G4733 Obstructive sleep apnea (adult) (pediatric): Secondary | ICD-10-CM

## 2022-06-29 MED ORDER — FLUTICASONE-SALMETEROL 500-50 MCG/ACT IN AEPB: 1.0000 | INHALATION_SPRAY | Freq: Two times a day (BID) | RESPIRATORY_TRACT | 6 refills | Status: DC

## 2022-06-29 MED ORDER — TAMSULOSIN HCL 0.4 MG PO CAPS: 0.4000 mg | ORAL_CAPSULE | Freq: Every day | ORAL | 0 refills | Status: DC

## 2022-06-29 MED ORDER — CYANOCOBALAMIN 1000 MCG/ML IJ SOLN
1000.0000 ug | Freq: Once | INTRAMUSCULAR | Status: AC
  Administered 2022-06-29: 1000 ug via INTRAMUSCULAR

## 2022-06-29 MED ORDER — ALBUTEROL SULFATE HFA 108 (90 BASE) MCG/ACT IN AERS: 1.0000 | INHALATION_SPRAY | Freq: Four times a day (QID) | RESPIRATORY_TRACT | 0 refills | Status: DC | PRN

## 2022-06-29 MED ORDER — METOPROLOL SUCCINATE ER 25 MG PO TB24: 25.0000 mg | ORAL_TABLET | Freq: Every day | ORAL | 1 refills | Status: DC

## 2022-06-29 MED ORDER — AMLODIPINE BESYLATE 10 MG PO TABS: 10.0000 mg | ORAL_TABLET | Freq: Every day | ORAL | 0 refills | Status: DC

## 2022-06-29 MED ORDER — SPIRONOLACTONE 25 MG PO TABS: 25.0000 mg | ORAL_TABLET | Freq: Every day | ORAL | 1 refills | Status: DC

## 2022-06-29 NOTE — Assessment & Plan Note (Signed)
He is scheduled to see GI soon.

## 2022-06-29 NOTE — Assessment & Plan Note (Addendum)
He reports symptoms have been under control. This is followed by Dr. Dierdre Forth. He continues [place].

## 2022-06-29 NOTE — Telephone Encounter (Signed)
Please call [facility] to request copy of shingrix.  [phone].

## 2022-06-29 NOTE — Assessment & Plan Note (Signed)
Reports good mood.   

## 2022-06-29 NOTE — Assessment & Plan Note (Signed)
Lab Results  Component Value Date   HGBA1C 5.1 04/18/2022   Looked good back in February.

## 2022-06-29 NOTE — Progress Notes (Signed)
Subjective:   By signing my name below, I, Brandon [name], attest that this documentation has been prepared under the direction and in the presence of Brandon Craze, NP.  06/29/2022.   Patient ID: Brandon [name], male    DOB: August 23, 1940, 82 y.o.   MRN: [id number]  Chief Complaint  Patient presents with   Hypertension    Here for follow up    HPI Patient is in today for an office visit.  Hypertension:  His blood pressure is well controlled in the office today. He is compliant with amlodipine, aldactone, metoprolol. BP Readings from Last 3 Encounters:  06/29/22 112/76  06/04/22 109/65  05/31/22 125/73   Prior CVA:  Since his hospital admission 04/17/22, he feels like his energy is returning. He continues to take Entresto and 81 mg ASA.   Rheumatoid arthritis:  He hasn't seen his rheumatologist in a while as his arthritis has been reportedly well managed. Currently he will follow-up as needed. Last week he received a cortisone injection for his left hip.  Sleep apnea:  He is using his CPAP.  Tolerates it well. Periodically he will see his sleep specialist.  Urinary:  On Flomax. Urination has been stable lately. He doesn't have a nephrologist.  Pulmonary:  He uses his Advair twice daily, morning and night. About once a day he needs to use albuterol, usually in the setting of climbing stairs.  B12:  He is taking B12 tablets daily. He notes that he needs to schedule an injection soon; we will administer this today.  Immunizations:  He confirms that he received a Covid-19 booster in Fall 2023. He also received the Shingrix vaccination at [facility].  Past Medical History:  Diagnosis Date   Anemia    Asthma    Hx of childhood asthma, disappeared for a while, then resurfaced 6-7 years ago.    Cardiomyopathy    with a negative cardiac catheterization in the past. (EF appriximately 40-45%)    Heme positive stool 02/01/2019   HTN (hypertension)    x 30 years   Obesity,  unspecified    Pneumonia    Sleep apnea    CPAP   Unspecified disorder resulting from impaired renal function     Past Surgical History:  Procedure Laterality Date   None      Family History  Problem Relation Age of Onset   Multiple myeloma Father    Lupus Sister        died at 67   Diabetes Mellitus II Sister        died from covid-19   Neuropathy Sister    Asthma Daughter    Colon cancer Neg Hx    Esophageal cancer Neg Hx     Social History   Socioeconomic History   Marital status: Married    Spouse name: Brandon [name]   Number of children: 3   Years of education: Not on file   Highest education level: Not on file  Occupational History   Occupation: retired  Tobacco Use   Smoking status: Former   Smokeless tobacco: Never   Tobacco comments:    quit smoling in 1990. started when 18, 1 ppd  Vaping Use   Vaping Use: Never used  Substance and Sexual Activity   Alcohol use: Yes    Comment: occ   Drug use: Never   Sexual activity: Not on file  Other Topics Concern   Not on file  Social History Narrative   Grew up in [place],  attended [employer]. First wife died of breast ca in 1997-07-25. 3 children. Remarried- 8 years. 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    Pt signed designated party release granting access to [facility] to his wife [name]. Detailed message may be left on home or cell phone. Roselle Locus August 13, 2009 11:34 am.    Social Determinants of Health   Financial Resource Strain: Medium Risk (03/19/2021)   Overall Financial Resource Strain (CARDIA)    Difficulty of Paying Living Expenses: Somewhat hard  Food Insecurity: No Food Insecurity (04/19/2022)   Hunger Vital Sign    Worried About Running Out of Food in the Last Year: Never true    Ran Out of Food in the Last Year: Never true  Transportation Needs: No Transportation Needs (04/20/2022)   PRAPARE - Administrator, Civil Service (Medical): No    Lack of Transportation  (Non-Medical): No  Physical Activity: Insufficiently Active (07/25/2020)   Exercise Vital Sign    Days of Exercise per Week: 5 days    Minutes of Exercise per Session: 10 min  Stress: No Stress Concern Present (05/28/2021)   [employer] of Occupational Health - Occupational Stress Questionnaire    Feeling of Stress : Not at all  Social Connections: Socially Integrated (05/28/2021)   Social Connection and Isolation Panel [NHANES]    Frequency of Communication with Friends and Family: More than three times a week    Frequency of Social Gatherings with Friends and Family: Three times a week    Attends Religious Services: More than 4 times per year    Active Member of Clubs or Organizations: Yes    Attends Banker Meetings: More than 4 times per year    Marital Status: Married  Catering manager Violence: Not At Risk (04/19/2022)   Humiliation, Afraid, Rape, and Kick questionnaire    Fear of Current or Ex-Partner: No    Emotionally Abused: No    Physically Abused: No    Sexually Abused: No    Outpatient Medications Prior to Visit  Medication Sig Dispense Refill   albuterol (PROVENTIL) (2.5 MG/3ML) 0.083% nebulizer solution TAKE 3 MLS BY NEBULIZER EVERY 6 HOURS AS NEEDED FOR WHEEZING FOR SHORTNESS OF BREATH 150 mL 3   aspirin EC 81 MG tablet Take 81 mg by mouth daily. Swallow whole.     b complex vitamins tablet Take 1 tablet by mouth daily.     furosemide (LASIX) 20 MG tablet Take 1 tablet (20 mg total) by mouth daily. (Patient taking differently: Take 20 mg by mouth daily as needed for fluid.) 30 tablet 3   leflunomide (ARAVA) 20 MG tablet Take 20 mg by mouth daily.     montelukast (SINGULAIR) 10 MG tablet Take 1 tablet (10 mg total) by mouth at bedtime. 90 tablet 0   rosuvastatin (CRESTOR) 10 MG tablet Take 1 tablet (10 mg total) by mouth daily. 90 tablet 1   sacubitril-valsartan (ENTRESTO) 97-103 MG Take 1 tablet by mouth 2 (two) times daily. (For medication assistance  program) 180 tablet 3   albuterol (VENTOLIN HFA) 108 (90 Base) MCG/ACT inhaler Inhale 1-2 puffs into the lungs every 6 (six) hours as needed for wheezing or shortness of breath. 8 g 0   amLODipine (NORVASC) 10 MG tablet Take 1 tablet by mouth once daily 90 tablet 0   fluticasone-salmeterol (ADVAIR) 500-50 MCG/ACT AEPB Inhale 1 puff into the lungs in the morning and at bedtime. 60 each  6   LORazepam (ATIVAN) 1 MG tablet Take 1 tablet (1 mg total) by mouth every 8 (eight) hours as needed for anxiety (mri claustrophobia). 10 tablet 0   metoprolol succinate (TOPROL-XL) 25 MG 24 hr tablet Take 1 tablet (25 mg total) by mouth daily. 90 tablet 1   spironolactone (ALDACTONE) 25 MG tablet Take 1 tablet (25 mg total) by mouth daily. 90 tablet 1   tamsulosin (FLOMAX) 0.4 MG CAPS capsule Take 1 capsule by mouth once daily 90 capsule 0   No facility-administered medications prior to visit.    Allergies  Allergen Reactions   Ace Inhibitors Cough   Isosorb Dinitrate-Hydralazine Other (See Comments)    REACTION: dizziness/hypotensive    ROS See HPI.     Objective:    Physical Exam Constitutional:      General: He is not in acute distress.    Appearance: Normal appearance. He is not ill-appearing.  HENT:     Head: Normocephalic and atraumatic.     Right Ear: Tympanic membrane, ear canal and external ear normal.     Left Ear: Tympanic membrane, ear canal and external ear normal.  Eyes:     Extraocular Movements: Extraocular movements intact.     Pupils: Pupils are equal, round, and reactive to light.  Cardiovascular:     Rate and Rhythm: Normal rate and regular rhythm.     Heart sounds: Normal heart sounds. No murmur heard.    No gallop.  Pulmonary:     Effort: Pulmonary effort is normal. No respiratory distress.     Breath sounds: Normal breath sounds. No wheezing or rales.  Skin:    General: Skin is warm and dry.  Neurological:     General: No focal deficit present.     Mental Status:  He is alert and oriented to person, place, and time.  Psychiatric:        Mood and Affect: Mood normal.        Behavior: Behavior normal.     BP 112/76 (BP Location: Right Arm, Patient Position: Sitting, Cuff Size: Large)   Pulse 65   Temp 98.4 F (36.9 C) (Oral)   Resp 16   Wt 219 lb (99.3 kg)   SpO2 100%   BMI 30.54 kg/m  Wt Readings from Last 3 Encounters:  06/29/22 219 lb (99.3 kg)  06/04/22 219 lb (99.3 kg)  05/31/22 222 lb 3.2 oz (100.8 kg)         Assessment & Plan:   Problem List Items Addressed This Visit       Unprioritized   Stage 3a chronic kidney disease    Not currently following with nephrology- check GFR.      Rheumatoid arthritis    He reports symptoms have been under control. This is followed by Dr. Dierdre Forth. He continues [place].        Obstructive sleep apnea    Reports good compliance with CPAP, continue same.  He is due for annual follow up with sleep specialist- advised pt to call.        Hyperglycemia    Lab Results  Component Value Date   HGBA1C 5.1 04/18/2022  Looked good back in February.       Heme positive stool    He is scheduled to see GI soon.       Folic acid deficiency   Relevant Orders   Folate   Essential hypertension - Primary    At goal on amlodipine, aldactone, metoprolol and  Entresto per cardiology. Continue same.       Depression    Reports good mood.        CVA (cerebral vascular accident)    Maintained on statin, aspirin for secondary stroke prevention.       BPH (benign prostatic hyperplasia)    Lab Results  Component Value Date   PSA 1.7 11/23/2019   PSA 3.40 01/30/2019   PSA 1.34 08/13/2009   Maintained on flomax.        Relevant Orders   PSA   B12 deficiency    Taking b12 tablets daily.  Will check b12 and give shot today.       Relevant Orders   B12   Asthma    Stable on advair 500mg . Using albuterol about once a day.         No orders of the defined types were placed in this  encounter.   I, [name], NP, personally preformed the services described in this documentation.  All medical record entries made by the scribe were at my direction and in my presence.  I have reviewed the chart and discharge instructions (if applicable) and agree that the record reflects my personal performance and is accurate and complete. 06/29/2022.  I,Mathew Stumpf,acting as a Neurosurgeon for [employer], NP.,have documented all relevant documentation on the behalf of [name], NP,as directed by  [name], NP while in the presence of [name], NP.   [name], NP

## 2022-06-29 NOTE — Assessment & Plan Note (Signed)
Stable on advair 500mg . Using albuterol about once a day.

## 2022-06-29 NOTE — Assessment & Plan Note (Signed)
At goal on amlodipine, aldactone, metoprolol and Entresto per cardiology. Continue same.

## 2022-06-29 NOTE — Addendum Note (Signed)
Addended by: Wilford Corner on: 06/29/2022 05:21 PM   Modules accepted: Orders

## 2022-06-29 NOTE — Assessment & Plan Note (Signed)
Taking b12 tablets daily.  Will check b12 and give shot today.

## 2022-06-29 NOTE — Assessment & Plan Note (Signed)
Maintained on statin, aspirin for secondary stroke prevention.

## 2022-06-29 NOTE — Assessment & Plan Note (Signed)
Lab Results  Component Value Date   PSA 1.7 11/23/2019   PSA 3.40 01/30/2019   PSA 1.34 08/13/2009    Maintained on flomax.

## 2022-06-29 NOTE — Assessment & Plan Note (Addendum)
Reports good compliance with CPAP, continue same.  He is due for annual follow up with sleep specialist- advised pt to call.

## 2022-06-29 NOTE — Assessment & Plan Note (Signed)
Not currently following with nephrology- check GFR.

## 2022-06-30 LAB — FOLATE: Folate: 6.4 ng/mL (ref >5.9)

## 2022-06-30 LAB — PSA: PSA: 1.06 ng/mL (ref 0.10–4.00)

## 2022-06-30 LAB — VITAMIN B12: Vitamin B-12: 934 pg/mL — ABNORMAL HIGH (ref 211–911)

## 2022-07-01 ENCOUNTER — Ambulatory Visit (INDEPENDENT_AMBULATORY_CARE_PROVIDER_SITE_OTHER): Payer: Medicare Other | Admitting: *Deleted

## 2022-07-01 ENCOUNTER — Telehealth: Payer: Self-pay | Admitting: Family

## 2022-07-01 VITALS — BP 90/62 | HR 48 | Ht 71.0 in | Wt 221.4 lb

## 2022-07-01 DIAGNOSIS — Z0000 Encounter for general adult medical examination without abnormal findings: Secondary | ICD-10-CM | POA: Diagnosis not present

## 2022-07-01 NOTE — Progress Notes (Signed)
Subjective:   Brandon [name] is a 82 y.o. male who presents for Medicare Annual/Subsequent preventive examination.  Review of Systems     Cardiac Risk Factors include: advanced age (>49men, >60 women);dyslipidemia;male gender;hypertension;obesity (BMI >30kg/m2)     Objective:    Today's Vitals   07/01/22 1545 07/01/22 1553  BP: (!) 96/59 90/62  Pulse: (!) 48   Weight: 221 lb 6.4 oz (100.4 kg)   Height:  (1.803 m)    Body mass index is 30.88 kg/m.     07/01/2022    3:47 PM 05/31/2022    9:22 AM 04/17/2022    7:56 PM 05/30/2021    7:19 PM 05/30/2021    4:50 PM 05/28/2021    2:41 PM 04/24/2019    2:06 PM  Advanced Directives  Does Patient Have a Medical Advance Directive? No No No No No No No  Would patient like information on creating a medical advance directive? No - Patient declined No - Patient declined No - Patient declined No - Patient declined  No - Patient declined No - Patient declined    Current Medications (verified) Outpatient Encounter Medications as of 07/01/2022  Medication Sig   albuterol (PROVENTIL) (2.5 MG/3ML) 0.083% nebulizer solution TAKE 3 MLS BY NEBULIZER EVERY 6 HOURS AS NEEDED FOR WHEEZING FOR SHORTNESS OF BREATH   albuterol (VENTOLIN HFA) 108 (90 Base) MCG/ACT inhaler Inhale 1-2 puffs into the lungs every 6 (six) hours as needed for wheezing or shortness of breath.   amLODipine (NORVASC) 10 MG tablet Take 1 tablet (10 mg total) by mouth daily.   aspirin EC 81 MG tablet Take 81 mg by mouth daily. Swallow whole.   b complex vitamins tablet Take 1 tablet by mouth daily.   fluticasone-salmeterol (ADVAIR) 500-50 MCG/ACT AEPB Inhale 1 puff into the lungs in the morning and at bedtime.   furosemide (LASIX) 20 MG tablet Take 1 tablet (20 mg total) by mouth daily. (Patient taking differently: Take 20 mg by mouth daily as needed for fluid.)   leflunomide (ARAVA) 20 MG tablet Take 20 mg by mouth daily.   metoprolol succinate (TOPROL-XL) 25 MG 24 hr tablet  Take 1 tablet (25 mg total) by mouth daily.   montelukast (SINGULAIR) 10 MG tablet Take 1 tablet (10 mg total) by mouth at bedtime.   rosuvastatin (CRESTOR) 10 MG tablet Take 1 tablet (10 mg total) by mouth daily.   sacubitril-valsartan (ENTRESTO) 97-103 MG Take 1 tablet by mouth 2 (two) times daily. (For medication assistance program)   spironolactone (ALDACTONE) 25 MG tablet Take 1 tablet (25 mg total) by mouth daily.   tamsulosin (FLOMAX) 0.4 MG CAPS capsule Take 1 capsule (0.4 mg total) by mouth daily.   No facility-administered encounter medications on file as of 07/01/2022.    Allergies (verified) Ace inhibitors and Isosorb dinitrate-hydralazine   History: Past Medical History:  Diagnosis Date   Anemia    Asthma    Hx of childhood asthma, disappeared for a while, then resurfaced 6-7 years ago.    Cardiomyopathy    with a negative cardiac catheterization in the past. 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    Past Surgical History:  Procedure Laterality Date   None     Family History  Problem Relation Age of Onset   Multiple myeloma Father    Lupus Sister        died at 23   Diabetes Mellitus II Sister        died from covid-19   Neuropathy Sister    Asthma Daughter    Colon cancer Neg Hx    Esophageal cancer Neg Hx    Social History   Socioeconomic History   Marital status: Married    Spouse name: Brandon [name]   Number of children: 3   Years of education: Not on file   Highest education level: Not on file  Occupational History   Occupation: retired  Tobacco Use   Smoking status: Former   Smokeless tobacco: Never   Tobacco comments:    quit smoling in July 10, 1988. started when 18, 1 ppd  Vaping Use   Vaping Use: Never used  Substance and Sexual Activity   Alcohol use: Yes    Comment: occ    Drug use: Never   Sexual activity: Not on file  Other Topics Concern   Not on file  Social History Narrative   Grew up in [name], attended [employer]. First wife died of breast ca in 07/10/97. 3 children. Remarried- 8 years. 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    Pt signed designated party release granting access to [facility] to his wife [name]. Detailed message may be left on home or cell phone. Roselle Locus August 13, 2009 11:34 am.    Social Determinants of Health   Financial Resource Strain: Medium Risk (03/19/2021)   Overall Financial Resource Strain (CARDIA)    Difficulty of Paying Living Expenses: Somewhat hard  Food Insecurity: No Food Insecurity (04/19/2022)   Hunger Vital Sign    Worried About Running Out of Food in the Last Year: Never true    Ran Out of Food in the Last Year: Never true  Transportation Needs: No Transportation Needs (04/20/2022)   PRAPARE - Administrator, Civil Service (Medical): No    Lack of Transportation (Non-Medical): No  Physical Activity: Insufficiently Active (07/25/2020)   Exercise Vital Sign    Days of Exercise per Week: 5 days    Minutes of Exercise per Session: 10 min  Stress: No Stress Concern Present (05/28/2021)   [employer] of Occupational Health - Occupational Stress Questionnaire    Feeling of Stress : Not at all  Social Connections: Socially Integrated (05/28/2021)   Social Connection and Isolation Panel [NHANES]    Frequency of Communication with Friends and Family: More than three times a week    Frequency of Social Gatherings with Friends and Family: Three times a week    Attends Religious Services: More than 4 times per year    Active Member of Clubs or Organizations: Yes    Attends Banker Meetings: More than 4 times per year    Marital Status: Married    Tobacco Counseling Counseling given: Not Answered Tobacco comments: quit smoling in 1988/07/10. started when 18, 1  ppd   Clinical Intake:  Pre-visit preparation completed: Yes  Pain : No/denies pain  BMI - recorded: 30.88 Nutritional Status: BMI > 30  Obese Nutritional Risks: None Diabetes: No  How often do you need to have someone help you when you read instructions, pamphlets, or other written materials from your doctor or pharmacy?: 1 - Never   Activities of Daily Living    07/01/2022    3:49 PM 04/18/2022  8:40 PM  In your present state of health, do you have any difficulty performing the following activities:  Hearing? 0 1  Vision? 0 0  Difficulty concentrating or making decisions? 0 0  Walking or climbing stairs? 0 0  Dressing or bathing? 0 0  Doing errands, shopping? 0 1  Preparing Food and eating ? N   Using the Toilet? N   In the past six months, have you accidently leaked urine? Y   Do you have problems with loss of bowel control? N   Managing your Medications? N   Managing your Finances? N   Housekeeping or managing your Housekeeping? N     Patient Care Team: [name], NP as PCP - General (Internal Medicine) [name], MD as PCP - Cardiology (Cardiology) Henrene Pastor, RPH-CPP (Pharmacist)  Indicate any recent Medical Services you may have received from other than [facility] providers in the past year (date may be approximate).     Assessment:   This is a routine wellness examination for Kenlee.  Hearing/Vision screen No results found.  Dietary issues and exercise activities discussed: Current Exercise Habits: The patient does not participate in regular exercise at present, Exercise limited by: None identified   Goals Addressed   None    Depression Screen    07/01/2022    3:38 PM 06/29/2022    1:59 PM 05/28/2021    2:24 PM 08/12/2020    4:42 PM 04/22/2017    1:30 PM 12/09/2015   11:02 AM 05/29/2014   11:02 AM  PHQ 2/9 Scores  PHQ - 2 Score 0 0 0 0 0 0 0  PHQ- 9 Score  0         Fall Risk    07/01/2022    3:39 PM 06/29/2022    1:59 PM  05/28/2021    2:24 PM 08/12/2020    4:42 PM 05/15/2019    2:11 PM  Fall Risk   Falls in the past year? 0 0 0 0 0  Number falls in past yr: 0 0 0 0 0  Injury with Fall? 0 0 0 0 0  Risk for fall due to : No Fall Risks No Fall Risks No Fall Risks    Follow up Falls evaluation completed Falls evaluation completed Falls evaluation completed      FALL RISK PREVENTION PERTAINING TO THE HOME:  Any stairs in or around the home? Yes  If so, are there any without handrails? No  Home free of loose throw rugs in walkways, pet beds, electrical cords, etc? Yes  Adequate lighting in your home to reduce risk of falls? Yes   ASSISTIVE DEVICES UTILIZED TO PREVENT FALLS:  Life alert? No  Use of a cane, walker or w/c? No  Grab bars in the bathroom? No  Shower chair or bench in shower?  Built in seat Elevated toilet seat or a handicapped toilet? No   TIMED UP AND GO:  Was the test performed? Yes .  Length of time to ambulate 10 feet: 7 sec.   Gait steady and fast without use of assistive device  Cognitive Function:        07/01/2022    3:51 PM 05/28/2021    2:30 PM  6CIT Screen  What Year? 0 points 0 points  What month? 0 points 0 points  What time? 0 points 0 points  Count back from 20 0 points 0 points  Months in reverse 0 points 0 points  Repeat phrase 0 points  0 points  Total Score 0 points 0 points    Immunizations Immunization History  Administered Date(s) Administered   COVID-19, mRNA, vaccine(Comirnaty)12 years and older 12/13/2021   Fluad Quad(high Dose 65+) 11/21/2018, 12/15/2020, 12/14/2021   Influenza Split 02/24/2011, 12/03/2011   Influenza Whole 03/16/2007, 12/29/2009   Influenza, High Dose Seasonal PF 02/26/2015, 12/09/2015, 04/22/2017, 12/14/2017   Influenza,inj,Quad PF,6+ Mos 02/13/2013, 11/14/2013   Moderna Covid-19 Vaccine Bivalent Booster 71yrs & up 12/08/2020   Moderna SARS-COV2 Booster Vaccination 12/15/2020   Moderna Sars-Covid-2 Vaccination 04/08/2019,  05/06/2019, 11/27/2019, 07/02/2020   Pneumococcal Conjugate-13 11/14/2013   Pneumococcal Polysaccharide-23 08/13/2009   Td 07/26/2006   Tdap 09/22/2012, 05/23/2017   Zoster, Live 05/17/2011    TDAP status: Up to date  Flu Vaccine status: Up to date  Pneumococcal vaccine status: Up to date  Covid-19 vaccine status: Information provided on how to obtain vaccines.   Qualifies for Shingles Vaccine? Yes   Zostavax completed Yes   Shingrix Completed?: No.    Education has been provided regarding the importance of this vaccine. Patient has been advised to call insurance company to determine out of pocket expense if they have not yet received this vaccine. Advised may also receive vaccine at local pharmacy or Health Dept. Verbalized acceptance and understanding.  Screening Tests Health Maintenance  Topic Date Due   Zoster Vaccines- Shingrix (1 of 2) Never done   COVID-19 Vaccine (7 - 2023-24 season) 02/07/2022   Medicare Annual Wellness (AWV)  05/29/2022   INFLUENZA VACCINE  10/14/2022   DTaP/Tdap/Td (4 - Td or Tdap) 05/24/2027   Pneumonia Vaccine 55+ Years old  Completed   HPV VACCINES  Aged Out    Health Maintenance  Health Maintenance Due  Topic Date Due   Zoster Vaccines- Shingrix (1 of 2) Never done   COVID-19 Vaccine (7 - 2023-24 season) 02/07/2022   Medicare Annual Wellness (AWV)  05/29/2022    Colorectal cancer screening: No longer required.   Lung Cancer Screening: (Low Dose CT Chest recommended if Age 36-80 years, 30 pack-year currently smoking OR have quit w/in 15years.) does not qualify.   Additional Screening:  Hepatitis C Screening: does not qualify  Vision Screening: Recommended annual ophthalmology exams for early detection of glaucoma and other disorders of the eye. Is the patient up to date with their annual eye exam?  Yes  Who is the provider or what is the name of the office in which the patient attends annual eye exams? Dr. Nilda Riggs If pt is not  established with a provider, would they like to be referred to a provider to establish care? No .   Dental Screening: Recommended annual dental exams for proper oral hygiene  Community Resource Referral / Chronic Care Management: CRR required this visit?  No   CCM required this visit?  No      Plan:     I have personally reviewed and noted the following in the patient's chart:   Medical and social history Use of alcohol, tobacco or illicit drugs  Current medications and supplements including opioid prescriptions. Patient is not currently taking opioid prescriptions. Functional ability and status Nutritional status Physical activity Advanced directives List of other physicians Hospitalizations, surgeries, and ER visits in previous 12 months Vitals Screenings to include cognitive, depression, and falls Referrals and appointments  In addition, I have reviewed and discussed with patient certain preventive protocols, quality metrics, and best practice recommendations. A written personalized care plan for preventive services as well as general preventive  health recommendations were provided to patient.     Donne Anon, [place]   07/01/2022   Nurse Notes: None

## 2022-07-01 NOTE — Patient Instructions (Signed)
Brandon [name] , Thank you for taking time to come for your Medicare Wellness Visit. I appreciate your ongoing commitment to your health goals. Please review the following plan we discussed and let me know if I can assist you in the future.     This is a list of the screening recommended for you and due dates:  Health Maintenance  Topic Date Due   Zoster (Shingles) Vaccine (1 of 2) Never done   COVID-19 Vaccine (7 - 2023-24 season) 02/07/2022   Flu Shot  10/14/2022   Medicare Annual Wellness Visit  07/01/2023   DTaP/Tdap/Td vaccine (4 - Td or Tdap) 05/24/2027   Pneumonia Vaccine  Completed   HPV Vaccine  Aged Out    Next appointment: Follow up in one year for your annual wellness visit.   Preventive Care 18 Years and Older, Male Preventive care refers to lifestyle choices and visits with your health care provider that can promote health and wellness. What does preventive care include? A yearly physical exam. This is also called an annual well check. Dental exams once or twice a year. Routine eye exams. Ask your health care provider how often you should have your eyes checked. Personal lifestyle choices, including: Daily care of your teeth and gums. Regular physical activity. Eating a healthy diet. Avoiding tobacco and drug use. Limiting alcohol use. Practicing safe sex. Taking low doses of aspirin every day. Taking vitamin and mineral supplements as recommended by your health care provider. What happens during an annual well check? The services and screenings done by your health care provider during your annual well check will depend on your age, overall health, lifestyle risk factors, and family history of disease. Counseling  Your health care provider may ask you questions about your: Alcohol use. Tobacco use. Drug use. Emotional well-being. Home and relationship well-being. Sexual activity. Eating habits. History of falls. Memory and ability to understand  (cognition). Work and work Astronomer. Screening  You may have the following tests or measurements: Height, weight, and BMI. Blood pressure. Lipid and cholesterol levels. These may be checked every 5 years, or more frequently if you are over 19 years old. Skin check. Lung cancer screening. You may have this screening every year starting at age 62 if you have a 30-pack-year history of smoking and currently smoke or have quit within the past 15 years. Fecal occult blood test (FOBT) of the stool. You may have this test every year starting at age 87. Flexible sigmoidoscopy or colonoscopy. You may have a sigmoidoscopy every 5 years or a colonoscopy every 10 years starting at age 39. Prostate cancer screening. Recommendations will vary depending on your family history and other risks. Hepatitis C blood test. Hepatitis B blood test. Sexually transmitted disease (STD) testing. Diabetes screening. This is done by checking your blood sugar (glucose) after you have not eaten for a while (fasting). You may have this done every 1-3 years. Abdominal aortic aneurysm (AAA) screening. You may need this if you are a current or former smoker. Osteoporosis. You may be screened starting at age 60 if you are at high risk. Talk with your health care provider about your test results, treatment options, and if necessary, the need for more tests. Vaccines  Your health care provider may recommend certain vaccines, such as: Influenza vaccine. This is recommended every year. Tetanus, diphtheria, and acellular pertussis (Tdap, Td) vaccine. You may need a Td booster every 10 years. Zoster vaccine. You may need this after age 73. Pneumococcal 13-valent  conjugate (PCV13) vaccine. One dose is recommended after age 70. Pneumococcal polysaccharide (PPSV23) vaccine. One dose is recommended after age 2. Talk to your health care provider about which screenings and vaccines you need and how often you need them. This  information is not intended to replace advice given to you by your health care provider. Make sure you discuss any questions you have with your health care provider. Document Released: 03/28/2015 Document Revised: 11/19/2015 Document Reviewed: 12/31/2014 Elsevier Interactive Patient Education  2017 [employer].  Fall Prevention in the Home Falls can cause injuries. They can happen to people of all ages. There are many things you can do to make your home safe and to help prevent falls. What can I do on the outside of my home? Regularly fix the edges of walkways and driveways and fix any cracks. Remove anything that might make you trip as you walk through a door, such as a raised step or threshold. Trim any bushes or trees on the path to your home. Use bright outdoor lighting. Clear any walking paths of anything that might make someone trip, such as rocks or tools. Regularly check to see if handrails are loose or broken. Make sure that both sides of any steps have handrails. Any raised decks and porches should have guardrails on the edges. Have any leaves, snow, or ice cleared regularly. Use sand or salt on walking paths during winter. Clean up any spills in your garage right away. This includes oil or grease spills. What can I do in the bathroom? Use night lights. Install grab bars by the toilet and in the tub and shower. Do not use towel bars as grab bars. Use non-skid mats or decals in the tub or shower. If you need to sit down in the shower, use a plastic, non-slip stool. Keep the floor dry. Clean up any water that spills on the floor as soon as it happens. Remove soap buildup in the tub or shower regularly. Attach bath mats securely with double-sided non-slip rug tape. Do not have throw rugs and other things on the floor that can make you trip. What can I do in the bedroom? Use night lights. Make sure that you have a light by your bed that is easy to reach. Do not use any sheets or  blankets that are too big for your bed. They should not hang down onto the floor. Have a firm chair that has side arms. You can use this for support while you get dressed. Do not have throw rugs and other things on the floor that can make you trip. What can I do in the kitchen? Clean up any spills right away. Avoid walking on wet floors. Keep items that you use a lot in easy-to-reach places. If you need to reach something above you, use a strong step stool that has a grab bar. Keep electrical cords out of the way. Do not use floor polish or wax that makes floors slippery. If you must use wax, use non-skid floor wax. Do not have throw rugs and other things on the floor that can make you trip. What can I do with my stairs? Do not leave any items on the stairs. Make sure that there are handrails on both sides of the stairs and use them. Fix handrails that are broken or loose. Make sure that handrails are as long as the stairways. Check any carpeting to make sure that it is firmly attached to the stairs. Fix any carpet that  is loose or worn. Avoid having throw rugs at the top or bottom of the stairs. If you do have throw rugs, attach them to the floor with carpet tape. Make sure that you have a light switch at the top of the stairs and the bottom of the stairs. If you do not have them, ask someone to add them for you. What else can I do to help prevent falls? Wear shoes that: Do not have high heels. Have rubber bottoms. Are comfortable and fit you well. Are closed at the toe. Do not wear sandals. If you use a stepladder: Make sure that it is fully opened. Do not climb a closed stepladder. Make sure that both sides of the stepladder are locked into place. Ask someone to hold it for you, if possible. Clearly mark and make sure that you can see: Any grab bars or handrails. First and last steps. Where the edge of each step is. Use tools that help you move around (mobility aids) if they are  needed. These include: Canes. Walkers. Scooters. Crutches. Turn on the lights when you go into a dark area. Replace any light bulbs as soon as they burn out. Set up your furniture so you have a clear path. Avoid moving your furniture around. If any of your floors are uneven, fix them. If there are any pets around you, be aware of where they are. Review your medicines with your doctor. Some medicines can make you feel dizzy. This can increase your chance of falling. Ask your doctor what other things that you can do to help prevent falls. This information is not intended to replace advice given to you by your health care provider. Make sure you discuss any questions you have with your health care provider. Document Released: 12/26/2008 Document Revised: 08/07/2015 Document Reviewed: 04/05/2014 Elsevier Interactive Patient Education  2017 [employer].

## 2022-07-01 NOTE — Telephone Encounter (Signed)
B12 actually looks ok with the oral b12 that he has been taking.  I think we can hold off on more b12 shots for now and just have him continue the oral supplement.

## 2022-07-01 NOTE — Telephone Encounter (Signed)
Patient advised for results and provider's comments. Appointment for monthly B12 cancelled

## 2022-07-18 ENCOUNTER — Encounter (HOSPITAL_BASED_OUTPATIENT_CLINIC_OR_DEPARTMENT_OTHER): Payer: Self-pay | Admitting: Emergency Medicine

## 2022-07-18 ENCOUNTER — Other Ambulatory Visit: Payer: Self-pay

## 2022-07-18 ENCOUNTER — Emergency Department (HOSPITAL_BASED_OUTPATIENT_CLINIC_OR_DEPARTMENT_OTHER)
Admission: EM | Admit: 2022-07-18 | Discharge: 2022-07-18 | Disposition: A | Discharge status: Home / Self Care | Payer: Medicare Other | Attending: Emergency Medicine | Admitting: Emergency Medicine

## 2022-07-18 DIAGNOSIS — Z7982 Long term (current) use of aspirin: Secondary | ICD-10-CM | POA: Diagnosis not present

## 2022-07-18 DIAGNOSIS — I10 Essential (primary) hypertension: Secondary | ICD-10-CM | POA: Diagnosis not present

## 2022-07-18 DIAGNOSIS — J45909 Unspecified asthma, uncomplicated: Secondary | ICD-10-CM | POA: Insufficient documentation

## 2022-07-18 DIAGNOSIS — J340 Abscess, furuncle and carbuncle of nose: Secondary | ICD-10-CM | POA: Insufficient documentation

## 2022-07-18 DIAGNOSIS — Z79899 Other long term (current) drug therapy: Secondary | ICD-10-CM | POA: Diagnosis not present

## 2022-07-18 MED ORDER — CEPHALEXIN 500 MG PO CAPS: 500.0000 mg | ORAL_CAPSULE | Freq: Four times a day (QID) | ORAL | 0 refills | Status: DC

## 2022-07-18 NOTE — ED Triage Notes (Signed)
Pt w/ abscess to RT nasal passage x 2d

## 2022-07-18 NOTE — Discharge Instructions (Signed)
Thank you for allowing me be part of your care today.  I have sent over a prescription to your pharmacy to treat the infection inside of your nose.  Please call your ENT office tomorrow morning and schedule an appointment as soon as possible.  Return to the ED if you develop sudden worsening of your symptoms or if you have any new concerns.

## 2022-07-18 NOTE — ED Provider Notes (Signed)
[facility] Provider Note   CSN: [id number] Arrival date & time: 07/18/22  1916     History  Chief Complaint  Patient presents with   Abscess    Brandon [name] is a 82 y.o. male presents to the ED complaining of an abscess to the right nasal passage for the past 4-5 days.  He states it has grown in size and has become more tender to the touch.  He has had to have it drained in the past by ENT.  Denies fever, difficulty breathing, drainage.     Past Medical History:  Diagnosis Date   Anemia    Asthma    Hx of childhood asthma, disappeared for a while, then resurfaced 6-7 years ago.    Cardiomyopathy    with a negative cardiac catheterization in the past. 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 Authorizing Provider  cephALEXin (KEFLEX) 500 MG capsule Take 1 capsule (500 mg total) by mouth 4 (four) times daily. 07/18/22  Yes Joshau Code R, PA-C  albuterol (PROVENTIL) (2.5 MG/3ML) 0.083% nebulizer solution TAKE 3 MLS BY NEBULIZER EVERY 6 HOURS AS NEEDED FOR WHEEZING FOR SHORTNESS OF BREATH 02/23/22   [name], NP  albuterol (VENTOLIN HFA) 108 (90 Base) MCG/ACT inhaler Inhale 1-2 puffs into the lungs every 6 (six) hours as needed for wheezing or shortness of breath. 06/29/22   [name], NP  amLODipine (NORVASC) 10 MG tablet Take 1 tablet (10 mg total) by mouth daily. 06/29/22   [name], NP  aspirin EC 81 MG tablet Take 81 mg by mouth daily. Swallow whole.    [provider]  b complex vitamins tablet Take 1 tablet by mouth daily. 01/23/19   [name], NP  fluticasone-salmeterol (ADVAIR) 500-50 MCG/ACT AEPB  Inhale 1 puff into the lungs in the morning and at bedtime. 06/29/22   [name], NP  furosemide (LASIX) 20 MG tablet Take 1 tablet (20 mg total) by mouth daily. Patient taking differently: Take 20 mg by mouth daily as needed for fluid. 03/30/22   [name], NP  leflunomide (ARAVA) 20 MG tablet Take 20 mg by mouth daily. 05/31/19   [provider]  metoprolol succinate (TOPROL-XL) 25 MG 24 hr tablet Take 1 tablet (25 mg total) by mouth daily. 06/29/22   [name], NP  montelukast (SINGULAIR) 10 MG tablet Take 1 tablet (10 mg total) by mouth at bedtime. 06/22/22   [name], NP  rosuvastatin (CRESTOR) 10 MG tablet Take 1 tablet (10 mg total) by mouth daily. 05/10/22   Wanda Plump, MD  sacubitril-valsartan (ENTRESTO) 97-103 MG Take 1 tablet by mouth 2 (two) times daily. (For medication assistance program) 04/30/22   [name], MD  spironolactone (ALDACTONE) 25 MG tablet Take 1 tablet (25 mg total) by mouth daily. 06/29/22   [name], NP  tamsulosin (FLOMAX) 0.4 MG CAPS capsule Take 1 capsule (0.4 mg total) by mouth daily. 06/29/22   [name], NP      Allergies    Ace inhibitors and Isosorb dinitrate-hydralazine    Review of Systems   Review  of Systems  Constitutional:  Negative for fever.  Respiratory:  Negative for shortness of breath.   Skin:        Abscess in right nare    Physical Exam Updated Vital Signs BP 95/63 (BP Location: Right Arm)   Pulse 68   Temp 98.7 F (37.1 C) (Oral)   Resp 18   Ht 5\' 11"  (1.803 m)   Wt 99.3 kg   SpO2 96%   BMI 30.54 kg/m  Physical Exam Vitals and nursing note reviewed.  Constitutional:      General: He is not in acute distress.    Appearance: Normal appearance. He is not ill-appearing or diaphoretic.  HENT:     Nose:     Comments: 0.5 cm tender abscess at the entrance of the right nasal passage.  There is no active drainage or bleeding. Cardiovascular:     Rate and Rhythm:  Normal rate and regular rhythm.  Pulmonary:     Effort: Pulmonary effort is normal.  Neurological:     Mental Status: He is alert. Mental status is at baseline.  Psychiatric:        Mood and Affect: Mood normal.        Behavior: Behavior normal.     ED Results / Procedures / Treatments   Labs (all labs ordered are listed, but only abnormal results are displayed) Labs Reviewed - No data to display  EKG None  Radiology No results found.  Procedures Procedures    Medications Ordered in ED Medications - No data to display  ED Course/ Medical Decision Making/ A&P                             Medical Decision Making  Patient presents to the ED complaining of an abscess to the right nasal passage.  He has had to have this drained in the past by ENT specialist.  He states it began again 4 to 5 days ago and has grown in size.  It is very tender to the touch.  Denies fever or drainage.  Exam significant for a 0.5 cm abscess at the enatrance of the right nasal passage.  It is tender to palpation.  No active drainage or bleeding.  Patient is able to breathe without difficulty.  Will start patient on antibiotics and refer him back to ENT.  Advised patient to call ENT provider's office tomorrow morning and schedule an appointment as soon as possible.  Patient verbalized his understanding is in agreement with this plan.  Discussed strict return precautions with patient and he verbalized his understanding.        Final Clinical Impression(s) / ED Diagnoses Final diagnoses:  Nasal abscess    Rx / DC Orders ED Discharge Orders          Ordered    cephALEXin (KEFLEX) 500 MG capsule  4 times daily        07/18/22 2140              Lenard Simmer, PA-C 07/18/22 2141    Glyn Ade, MD 07/19/22 1515

## 2022-07-29 ENCOUNTER — Ambulatory Visit: Payer: Medicare Other

## 2022-08-04 ENCOUNTER — Ambulatory Visit (INDEPENDENT_AMBULATORY_CARE_PROVIDER_SITE_OTHER): Payer: Medicare Other | Admitting: Pharmacist

## 2022-08-04 VITALS — BP 130/70

## 2022-08-04 DIAGNOSIS — I10 Essential (primary) hypertension: Secondary | ICD-10-CM

## 2022-08-04 DIAGNOSIS — I5022 Chronic systolic (congestive) heart failure: Secondary | ICD-10-CM

## 2022-08-04 MED ORDER — METOPROLOL SUCCINATE ER 25 MG PO TB24: 37.5000 mg | ORAL_TABLET | Freq: Every day | ORAL | 1 refills | Status: DC

## 2022-08-04 NOTE — Progress Notes (Signed)
Pharmacy Note  08/04/2022 Name: Brandon [name] MRN: [id number] DOB: 1940-11-03  Subjective: Brandon [name] is a 82 y.o. year old male who is a primary care patient of Brandon Craze, NP. Clinical Pharmacist Practitioner referral was placed to assist with medication management.    Engaged with patient by telephone for follow up visit today.  CHF: Current treatment - Entresto 97/109mg  twice a day, fuorsemide 20mg  daily if needed, metoprolol succinate ER 25mg  - take 1.5 tablets = 37.5mg  daily, spironolactone 25mg  daily.  Patient denied shortness of breath or edema. He states strength is not back to 100% since stroke but he is feeling much better. Saw Dr Brandon [name] 06/04/2022  Wt Readings from Last 3 Encounters:  07/18/22 219 lb (99.3 kg)  07/01/22 221 lb 6.4 oz (100.4 kg)  06/29/22 219 lb (99.3 kg)    Hypertension:  Taking Entresto, metoprolol ER 25mg  daily and amlodipine 10mg  daily.  Blood pressure at goal in office today  CVA: Patient was hospitalized 04/18/22 to 04/17/2022 for CVA after he presented to ED with dizziness. Brain MRI showed a focal area of signal abormaility in the ventral R thalamus (likely recent subacute infarct). Radiologist recommended a follow up enhanced brain MRI in a few weeks. The stroke team recommended to continue ASA 81 mg PO daily, a Zio patch (placed 04/19/2022) and started crestor 10 mg PO daily.  Stroke team did not recommend plavix at this time. Per Dr Brandon [name]'s notes, Zio patch did not show sustained afib.     SDOH (Social Determinants of Health) assessments and interventions performed:  SDOH Interventions    Flowsheet Row Clinical Support from 07/01/2022 in [facility] at [facility] Telephone from 04/20/2022 in [facility] Community Care Coordination Clinical Support from 05/28/2021 in [facility] at [facility] Chronic Care Management from 04/06/2021 in [facility]  Primary Care at [facility] Chronic Care Management from 03/19/2021 in [facility] Care at [facility] Chronic Care Management from 02/04/2021 in [facility] Primary Care at [facility]  SDOH Interventions        Housing Interventions -- -- Intervention Not Indicated -- -- --  Transportation Interventions -- Intervention Not Indicated  [normally drives self,  wife provides transportation as needed] Intervention Not Indicated Intervention Not Indicated -- --  Alcohol Usage Interventions Intervention Not Indicated (Score <7) -- -- -- -- --  Financial Strain Interventions -- -- -- -- Other (Comment)  [patient has been approved to received Advair and Entresto from patient assistance programs thru 03/14/2022] Other (Comment)  [completed application for Advair and working on app for MAP for Entresto]  Stress Interventions -- -- Intervention Not Indicated -- -- --  Social Connections Interventions -- -- Intervention Not Indicated -- -- --        Objective: Review of patient status, including review of consultants reports, laboratory and other test data, was performed as part of comprehensive.  Lab Results  Component Value Date   CREATININE 1.31 (H) 04/19/2022   CREATININE 1.28 (H) 04/17/2022   CREATININE 1.37 03/30/2022    Lab Results  Component Value Date   HGBA1C 5.1 04/18/2022       Component Value Date/Time   CHOL 188 04/18/2022 1254   TRIG 59 04/18/2022 1254   HDL 51 04/18/2022 1254   CHOLHDL 3.7 04/18/2022 1254   VLDL 12 04/18/2022 1254   LDLCALC 125 (H) 04/18/2022 1254  Clinical ASCVD: Yes  The ASCVD Risk score (Arnett DK, et al., 2019) failed to calculate for the following reasons:   The 2019 ASCVD risk score is only valid for ages 110 to 73   The patient has a prior MI or stroke diagnosis    BP Readings from Last 3 Encounters:  08/04/22 130/70  07/18/22 110/67  07/01/22 90/62     Allergies  Allergen Reactions    Ace Inhibitors Cough   Isosorb Dinitrate-Hydralazine Other (See Comments)    REACTION: dizziness/hypotensive    Medications Reviewed Today     Reviewed by Henrene Pastor, RPH-CPP (Pharmacist) on 08/04/22 at 1331  Med List Status: <None>   Medication Order Taking? Sig Documenting Provider Last Dose Status Informant  albuterol (PROVENTIL) (2.5 MG/3ML) 0.083% nebulizer solution [id number] Yes TAKE 3 MLS BY NEBULIZER EVERY 6 HOURS AS NEEDED FOR WHEEZING FOR SHORTNESS OF Samule Ohm, NP Taking Active Self  albuterol (VENTOLIN HFA) 108 (90 Base) MCG/ACT inhaler [id number] Yes Inhale 1-2 puffs into the lungs every 6 (six) hours as needed for wheezing or shortness of breath. Brandon Craze, NP Taking Active   amLODipine (NORVASC) 10 MG tablet [id number] Yes Take 1 tablet (10 mg total) by mouth daily. Brandon Craze, NP Taking Active   aspirin EC 81 MG tablet [id number] Yes Take 81 mg by mouth daily. Swallow whole. [provider] Taking Active   b complex vitamins tablet [id number] Yes Take 1 tablet by mouth daily. Brandon Craze, NP Taking Active Self  cephALEXin (KEFLEX) 500 MG capsule [id number] Yes Take 1 capsule (500 mg total) by mouth 4 (four) times daily. Clark, Meghan R, PA-C Taking Active   fluticasone-salmeterol (ADVAIR) 500-50 MCG/ACT AEPB [id number] Yes Inhale 1 puff into the lungs in the morning and at bedtime. Brandon Craze, NP Taking Active   furosemide (LASIX) 20 MG tablet [id number] Yes Take 1 tablet (20 mg total) by mouth daily. Brandon Craze, NP Taking Active Self           Med Note Clydie Braun, Babette Relic B   Wed Aug 04, 2022  1:30 PM)    leflunomide (ARAVA) 20 MG tablet [id number] Yes Take 20 mg by mouth daily. [provider] Taking Active Self  metoprolol succinate (TOPROL-XL) 25 MG 24 hr tablet [id number] Yes Take 1 tablet (25 mg total) by mouth daily.  Patient taking differently: Take 1.5 tablets by mouth daily.   Brandon Craze, NP  Taking Active   montelukast (SINGULAIR) 10 MG tablet [id number] Yes Take 1 tablet (10 mg total) by mouth at bedtime. Brandon Craze, NP Taking Active   rosuvastatin (CRESTOR) 10 MG tablet [id number] Yes Take 1 tablet (10 mg total) by mouth daily. Wanda Plump, MD Taking Active   sacubitril-valsartan (ENTRESTO) 97-103 MG [id number] Yes Take 1 tablet by mouth 2 (two) times daily. (For medication assistance program) [name], MD Taking Active   spironolactone (ALDACTONE) 25 MG tablet [id number] Yes Take 1 tablet (25 mg total) by mouth daily. Brandon Craze, NP Taking Active   tamsulosin [facility]) 0.4 MG CAPS capsule [id number] Yes Take 1 capsule (0.4 mg total) by mouth daily. 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   Coronary artery calcification 03/30/2022  Nasal mass 06/02/2021   B12 deficiency 08/13/2020   Rheumatoid arthritis (HCC) 08/12/2020   BPH (benign prostatic hyperplasia) 08/12/2020   Folic acid deficiency 08/12/2020   Stage 3a chronic kidney disease (HCC) 10/17/2019   Heme positive stool 02/01/2019   RBBB 10/03/2018   Depression 12/09/2015   Asthma 08/13/2009   Hyperglycemia 08/13/2009   Obstructive sleep apnea 01/23/2009   CHRONIC SYSTOLIC HEART FAILURE 09/11/2008   Obesity, unspecified 08/26/2008   Disorder resulting from impaired renal function 08/26/2008   Essential hypertension 06/28/2008   Nonischemic cardiomyopathy (HCC) 06/28/2008     Assessment / Plan: CHF:  Continue current medications - Entresto, furosemide, metoprolol and spironolactone.  Continue to weigh daily - report weight gain of more than 3 lbs in 24 hours or 5 lbs in 1 week. Updated Rx for metoprolol at pharmacy to  reflect that he is now taking metoprolol 25mg  - 1.5 tablets daily.  Hypertension / CVA:  Check blood pressure at home once daily and record.  Continue current medications to lower blood pressure.  Continue rosuvastatin 10mg  daily - goal LDL < 55.  Continue aspirin 81mg  daily  Medication Assistance:  Application for Entresto  medication assistance program has been held up for a few months. Applied for [employer] today and patient was approved. Cancelled medication assistance program application.     Medication management:  Reviewed and updated medication list Reviewed refill history and adherence Assisted with requesting refills for the following medications - spironolactone, rosuvastatin and Entresto.  Verified with [employer] - without [employer] Entresto would have been $110 but $0 with grant  Follow Up:  Telephone follow up appointment with care management team member scheduled for:  3 months.    Henrene Pastor, PharmD Clinical Pharmacist [facility]  - [facility] [phone]

## 2022-08-05 ENCOUNTER — Telehealth: Payer: Self-pay | Admitting: *Deleted

## 2022-08-05 ENCOUNTER — Ambulatory Visit: Payer: Medicare Other | Admitting: Nurse Practitioner

## 2022-08-05 ENCOUNTER — Encounter: Payer: Self-pay | Admitting: Nurse Practitioner

## 2022-08-05 VITALS — BP 116/68 | HR 68 | Ht 71.0 in | Wt 222.0 lb

## 2022-08-05 DIAGNOSIS — Z9189 Other specified personal risk factors, not elsewhere classified: Secondary | ICD-10-CM | POA: Diagnosis not present

## 2022-08-05 DIAGNOSIS — Z8679 Personal history of other diseases of the circulatory system: Secondary | ICD-10-CM

## 2022-08-05 DIAGNOSIS — D649 Anemia, unspecified: Secondary | ICD-10-CM | POA: Diagnosis not present

## 2022-08-05 DIAGNOSIS — R195 Other fecal abnormalities: Secondary | ICD-10-CM | POA: Diagnosis not present

## 2022-08-05 DIAGNOSIS — I5022 Chronic systolic (congestive) heart failure: Secondary | ICD-10-CM

## 2022-08-05 NOTE — Telephone Encounter (Signed)
   Name: Brandon [name]  DOB: 02/08/1941  MRN: [id number]  Primary Cardiologist: [name], MD   Preoperative team, please contact this patient and set up a phone call appointment for further preoperative risk assessment. Please obtain consent and complete medication review. Thank you for your help.  I confirm that guidance regarding antiplatelet and oral anticoagulation therapy has been completed and, if necessary, noted below.  Guidance for holding aspirin will need to come from prescribing provider.   Napoleon Form, Leodis Rains, NP 08/05/2022, 9:36 AM [facility]

## 2022-08-05 NOTE — Progress Notes (Signed)
Assessment / Plan   Primary GI: Brandon Bologna, MD   82 year old male with multiple medical problems referred for Hemoccult positive stool.   Seen in 2021 for the same, EGD and colonoscopy never came to fruition for unclear reasons though it appears he was cleared by Cardiology.  He has occasional scant rectal bleeding with bowel movements, no other overt GI bleeding Plan:  -Will proceed with EGD and colonoscopy if Cardiology clears him for the procedures. The risks and benefits of EGD and colonoscopy with possible polypectomy / biopsies were discussed and the patient agrees to proceed.    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 Baseline hemoglobin 11.7  History of CVA.   Hospitalized in early February 2024 with what was felt to be a subacute infarct.    Chronic systolic heart failure. EF of 45 - 50%    History of Present Illness   Chief Complaint: none.     82 y.o. yo male with multiple medical problems not limited to chronic systolic heart failure, obesity, OSA on CPAP, hypertension, CKD 3 , rheumatoid arthritis, asthma, colon polyps, fatty liver. Patient is referred by PCP for evaluation of Hemoccult positive stool  Brandon [name] was last seen here February 2021,  at that time for evaluation of Hemoccult positive stool.  The plan was for an EGD and colonoscopy after cardiac evaluation.  It appears he was given clearance by cardiology but the procedures were not done  for unclear reasons ?    Brandon [name] has no GI complaints.  Occasionally he does have a scant amount of rectal bleeding with bowel movements even though he denies constipation or straining.      Latest Ref Rng & Units 04/19/2022    3:00 AM 04/17/2022    7:59 PM 03/22/2022   10:02 AM  CBC  WBC 4.0 - 10.5 K/uL 5.6  5.7  6.9   Hemoglobin 13.0 - 17.0 g/dL 16.1  09.6  04.5   Hematocrit 39.0 - 52.0 % 35.3  39.3  36.4   Platelets 150 - 400 K/uL 196  209  174.0     No results found for: "LIPASE"     Latest Ref Rng & Units 04/19/2022    3:00 AM 04/17/2022    7:59 PM 03/30/2022    2:19 PM  CMP  Glucose 70 - 99 mg/dL 90  98  70   BUN 8 - 23 mg/dL 10  13  28    Creatinine 0.61 - 1.24 mg/dL 4.09  8.11  9.14   Sodium 135 - 145 mmol/L 140  136  139   Potassium 3.5 - 5.1 mmol/L 3.8  4.0  4.1   Chloride 98 - 111 mmol/L 109  106  106   CO2 22 - 32 mmol/L 25  25  26    Calcium 8.9 - 10.3 mg/dL 9.3  9.1  8.7      MR BRAIN W WO CONTRAST CLINICAL DATA:  Brain/CNS neoplasm, assess treatment response. Follow-up abnormal MRI last month.  EXAM: MRI HEAD WITHOUT AND WITH CONTRAST  TECHNIQUE: Multiplanar, multiecho pulse sequences of the brain and surrounding structures were obtained without and with intravenous contrast.  CONTRAST:  10mL GADAVIST GADOBUTROL 1 MMOL/ML IV SOLN  COMPARISON:  MRI brain 04/18/2022.  FINDINGS: Brain: The previously questioned lesion in the right anterior thalamus is resolved to represent evolving infarct. Previously seen wispy enhancement has completely resolved and the lesion is demonstrating more fluid signal, consistent with evolving infarct.  No acute infarct or intracranial hemorrhage. Underlying mild chronic small-vessel disease. No hydrocephalus or extra-axial collection. No mass effect or midline shift. No abnormal enhancement.  Vascular: Normal flow voids and enhancement.  Skull and upper cervical spine: Normal marrow signal and enhancement.  Sinuses/Orbits: Unremarkable.  Other: Unchanged left suboccipital scalp lipoma.  IMPRESSION: 1. The previously questioned lesion in the right anterior thalamus is resolved to represent evolving infarct. Previously seen wispy enhancement has completely resolved and the lesion is demonstrating more fluid signal, consistent with evolving infarct. 2. No acute intracranial process.  Electronically Signed   By: Orvan Falconer M.D.   On: 06/04/2022 11:34    Past Medical History:  Diagnosis Date   Anemia     Asthma    Hx of childhood asthma, disappeared for a while, then resurfaced 6-7 years ago.    Cardiomyopathy    with a negative cardiac catheterization in the past. 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 started when 18, 1 ppd  Vaping Use   Vaping Use: Never used  Substance Use Topics   Alcohol use: Yes    Comment: occ   Drug use: Never   Current Outpatient Medications  Medication Sig Dispense Refill   albuterol (PROVENTIL) (2.5 MG/3ML) 0.083% nebulizer solution TAKE 3 MLS BY NEBULIZER EVERY 6 HOURS AS NEEDED FOR WHEEZING FOR SHORTNESS OF BREATH 150 mL 3   albuterol (VENTOLIN HFA) 108 (90 Base) MCG/ACT inhaler Inhale 1-2 puffs into the lungs every 6 (six) hours as needed for wheezing or shortness of breath. 8 g 0   amLODipine (NORVASC) 10 MG tablet Take 1 tablet (10 mg total) by mouth daily. 90 tablet 0   aspirin EC 81 MG tablet Take 81 mg by mouth daily. Swallow whole.     b complex vitamins tablet Take 1 tablet by mouth daily.     cephALEXin (KEFLEX) 500 MG capsule Take 1 capsule (500 mg total) by mouth 4 (four) times daily. 20 capsule 0   fluticasone-salmeterol (ADVAIR) 500-50 MCG/ACT AEPB Inhale 1 puff into the lungs in the morning and at bedtime. 60 each 6   furosemide (LASIX) 20 MG tablet Take 1 tablet (20 mg total) by mouth daily. 30 tablet 3    leflunomide (ARAVA) 20 MG tablet Take 20 mg by mouth daily.     metoprolol succinate (TOPROL-XL) 25 MG 24 hr tablet Take 1.5 tablets (37.5 mg total) by mouth daily. 150 tablet 1   montelukast (SINGULAIR) 10 MG tablet Take 1 tablet (10 mg total) by mouth at bedtime. 90 tablet 0   rosuvastatin (CRESTOR) 10 MG tablet Take 1  tablet (10 mg total) by mouth daily. 90 tablet 1   sacubitril-valsartan (ENTRESTO) 97-103 MG Take 1 tablet by mouth 2 (two) times daily. (For medication assistance program) 180 tablet 3   spironolactone (ALDACTONE) 25 MG tablet Take 1 tablet (25 mg total) by mouth daily. 90 tablet 1   tamsulosin (FLOMAX) 0.4 MG CAPS capsule Take 1 capsule (0.4 mg total) by mouth daily. 90 capsule 0   No current facility-administered medications for this visit.   Allergies  Allergen Reactions   Ace Inhibitors Cough   Isosorb Dinitrate-Hydralazine Other (See Comments)    REACTION: dizziness/hypotensive     Review of Systems: Positive for fatigue, arthritis, shortness of breath.  All other systems reviewed and negative except where noted in HPI.   Wt Readings from Last 3 Encounters:  07/18/22 219 lb (99.3 kg)  07/01/22 221 lb 6.4 oz (100.4 kg)  06/29/22 219 lb (99.3 kg)    Physical Exam:  BP 116/68   Pulse 68   Ht 5\' 11"  (1.803 m)   Wt 222 lb (100.7 kg)   SpO2 97%   BMI 30.96 kg/m  Constitutional:  Pleasant, generally well appearing male in no acute distress. Psychiatric:  Normal mood and affect. Behavior is normal. EENT: Pupils normal.  Conjunctivae are normal. No scleral icterus. Neck supple.  Cardiovascular: Normal rate, regular rhythm.  Pulmonary/chest: Effort normal and breath sounds normal. No wheezing, rales or rhonchi. Abdominal: Soft, nondistended, nontender. Bowel sounds active throughout. There are no masses palpable. No hepatomegaly. Neurological: Alert and oriented to person place and time. Skin: Skin is warm and dry. No rashes noted.  Willette Cluster, NP   08/05/2022, 8:16 AM  Cc:  Referring Provider [name], NP

## 2022-08-05 NOTE — Patient Instructions (Addendum)
We will contact Dr Antoine Poche and get cardiac clearance first , once cleared we will call you to schedule a colonoscopy. You may need a pre-visit appointment before the colonoscopy depending on when we can get it scheduled.   _______________________________________________________  If your blood pressure at your visit was 140/90 or greater, please contact your primary care physician to follow up on this.  _______________________________________________________  If you are age 82 or older, your body mass index should be between 23-30. Your Body mass index is 30.96 kg/m. If this is out of the aforementioned range listed, please consider follow up with your Primary Care Provider.  If you are age 9 or younger, your body mass index should be between 19-25. Your Body mass index is 30.96 kg/m. If this is out of the aformentioned range listed, please consider follow up with your Primary Care Provider.   ________________________________________________________  The [facility] providers would like to encourage you to use [facility] to communicate with providers for non-urgent requests or questions.  Due to long hold times on the telephone, sending your provider a message by [facility] may be a faster and more efficient way to get a response.  Please allow 48 business hours for a response.  Please remember that this is for non-urgent requests.  _______________________________________________________   I appreciate the  opportunity to care for you  Thank You   Midge Minium

## 2022-08-05 NOTE — Telephone Encounter (Signed)
Pt has been scheduled for tele pre op appt 08/12/22 @ 2:40. Med rec and consent are done.     Patient Consent for Virtual Visit        Brandon [name] has provided verbal consent on 08/05/2022 for a virtual visit (video or telephone).   CONSENT FOR VIRTUAL VISIT FOR:  Brandon [name]  By participating in this virtual visit I agree to the following:  I hereby voluntarily request, consent and authorize [facility] and its employed or contracted physicians, physician assistants, nurse practitioners or other licensed health care professionals (the Practitioner), to provide me with telemedicine health care services (the "Services") as deemed necessary by the treating Practitioner. I acknowledge and consent to receive the Services by the Practitioner via telemedicine. I understand that the telemedicine visit will involve communicating with the Practitioner through live audiovisual communication technology and the disclosure of certain medical information by electronic transmission. I acknowledge that I have been given the opportunity to request an in-person assessment or other available alternative prior to the telemedicine visit and am voluntarily participating in the telemedicine visit.  I understand that I have the right to withhold or withdraw my consent to the use of telemedicine in the course of my care at any time, without affecting my right to future care or treatment, and that the Practitioner or I may terminate the telemedicine visit at any time. I understand that I have the right to inspect all information obtained and/or recorded in the course of the telemedicine visit and may receive copies of available information for a reasonable fee.  I understand that some of the potential risks of receiving the Services via telemedicine include:  Delay or interruption in medical evaluation due to technological equipment failure or disruption; Information transmitted may not be sufficient (e.g.  poor resolution of images) to allow for appropriate medical decision making by the Practitioner; and/or  In rare instances, security protocols could fail, causing a breach of personal health information.  Furthermore, I acknowledge that it is my responsibility to provide information about my medical history, conditions and care that is complete and accurate to the best of my ability. I acknowledge that Practitioner's advice, recommendations, and/or decision may be based on factors not within their control, such as incomplete or inaccurate data provided by me or distortions of diagnostic images or specimens that may result from electronic transmissions. I understand that the practice of medicine is not an exact science and that Practitioner makes no warranties or guarantees regarding treatment outcomes. I acknowledge that a copy of this consent can be made available to me via my patient portal [facility] MyChart), or I can request a printed copy by calling the office of  [facility].    I understand that my insurance will be billed for this visit.   I have read or had this consent read to me. I understand the contents of this consent, which adequately explains the benefits and risks of the Services being provided via telemedicine.  I have been provided ample opportunity to ask questions regarding this consent and the Services and have had my questions answered to my satisfaction. I give my informed consent for the services to be provided through the use of telemedicine in my medical care

## 2022-08-05 NOTE — Telephone Encounter (Signed)
Pt has been scheduled for tele pre op appt 08/12/22 @ 2:40. Med rec and consent are done.

## 2022-08-05 NOTE — Telephone Encounter (Signed)
[facility] Pre-operative Risk Assessment     Request for surgical clearance:     Endoscopy Procedure  What type of surgery is being performed?     colonoscopy  When is this surgery scheduled?     TBD  What type of clearance is required ?   CARDIAC CLEARANCE   Are there any medications that need to be held prior to surgery and how long? NO  Practice name and name of physician performing surgery?      [facility]  Dr Chales Abrahams   What is your office phone and fax number?      Phone- [phone]  Fax- [phone]  Anesthesia type (None, local, MAC, general) ?       MAC

## 2022-08-12 ENCOUNTER — Ambulatory Visit: Payer: Medicare Other | Attending: Cardiology

## 2022-08-12 DIAGNOSIS — Z01810 Encounter for preprocedural cardiovascular examination: Secondary | ICD-10-CM

## 2022-08-12 NOTE — Progress Notes (Signed)
Cleared by cardiology Proceed with EGD/colon as per last note I have copied Cathlyn Parsons as well.  I think he does meet [facility] criteria RG

## 2022-08-12 NOTE — Progress Notes (Signed)
Virtual Visit via Telephone Note   Because of Brandon [name]'s co-morbid illnesses, he is at least at moderate risk for complications without adequate follow up.  This format is felt to be most appropriate for this patient at this time.  The patient did not have access to video technology/had technical difficulties with video requiring transitioning to audio format only (telephone).  All issues noted in this document were discussed and addressed.  No physical exam could be performed with this format.  Please refer to the patient's chart for his consent to telehealth for [facility].  Evaluation Performed:  Preoperative cardiovascular risk assessment _____________   Date:  08/12/2022   Patient ID:  Brandon [name], DOB 10/27/1940, MRN [id number] Patient Location:  Home Provider location:   Office  Primary Care Provider:  [name], NP Primary Cardiologist:  [name], MD  Chief Complaint / Patient Profile   82 y.o. y/o male with a h/o chronic systolic CHF, HTN, SVT who is pending colonoscopy and presents today for telephonic preoperative cardiovascular risk assessment.  History of Present Illness    Brandon [name] is a 82 y.o. male who presents via audio/video conferencing for a telehealth visit today.  Pt was last seen in cardiology clinic on 06/04/2022 by Dr. Antoine Poche.  At that time Jacquez E [name] was doing well .  The patient is now pending procedure as outlined above. Since his last visit, he remains stable from a cardiac standpoint.  Today he denies chest pain, shortness of breath, lower extremity edema, fatigue, palpitations, melena, hematuria, hemoptysis, diaphoresis, weakness, presyncope, syncope, orthopnea, and PND.   Past Medical History    Past Medical History:  Diagnosis Date   Anemia    Asthma    Hx of childhood asthma, disappeared for a while, then resurfaced 6-7 years ago.    Cardiomyopathy    with a negative cardiac catheterization in  the past. 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    Past Surgical History:  Procedure Laterality Date   None      Allergies  Allergies  Allergen Reactions   Ace Inhibitors Cough   Isosorb Dinitrate-Hydralazine Other (See Comments)    REACTION: dizziness/hypotensive    Home Medications    Prior to Admission medications   Medication Sig Start Date End Date Taking? Authorizing Provider  albuterol (PROVENTIL) (2.5 MG/3ML) 0.083% nebulizer solution TAKE 3 MLS BY NEBULIZER EVERY 6 HOURS AS NEEDED FOR WHEEZING FOR SHORTNESS OF BREATH 02/23/22   [name], NP  albuterol (VENTOLIN HFA) 108 (90 Base) MCG/ACT inhaler Inhale 1-2 puffs into the lungs every 6 (six) hours as needed for wheezing or shortness of breath. 06/29/22   [name], NP  amLODipine (NORVASC) 10 MG tablet Take 1 tablet (10 mg total) by mouth daily. 06/29/22   [name], NP  aspirin EC 81 MG tablet Take 81 mg by mouth daily. Swallow whole.    [provider]  cephALEXin (KEFLEX) 500 MG capsule Take 1 capsule (500 mg total) by mouth 4 (four) times daily. 07/18/22   Clark, Meghan R, PA-C  cyanocobalamin (VITAMIN B12) 500 MCG tablet Take 500 mcg by mouth daily.    [provider]  fluticasone-salmeterol (ADVAIR) 500-50 MCG/ACT AEPB  Inhale 1 puff into the lungs in the morning and at bedtime. 06/29/22   [name], NP  furosemide (LASIX) 20 MG tablet Take 1 tablet (20 mg total) by mouth daily. 03/30/22   [name], NP  leflunomide (ARAVA) 20 MG tablet Take 20 mg by mouth daily. 05/31/19   [provider]  metoprolol succinate (TOPROL-XL) 25 MG 24 hr tablet Take 1.5 tablets (37.5 mg total) by mouth daily. 08/04/22   Wanda Plump, MD  montelukast (SINGULAIR) 10 MG  tablet Take 1 tablet (10 mg total) by mouth at bedtime. 06/22/22   [name], NP  rosuvastatin (CRESTOR) 10 MG tablet Take 1 tablet (10 mg total) by mouth daily. 05/10/22   Wanda Plump, MD  sacubitril-valsartan (ENTRESTO) 97-103 MG Take 1 tablet by mouth 2 (two) times daily. (For medication assistance program) 04/30/22   [name], MD  spironolactone (ALDACTONE) 25 MG tablet Take 1 tablet (25 mg total) by mouth daily. 06/29/22   [name], NP  tamsulosin (FLOMAX) 0.4 MG CAPS capsule Take 1 capsule (0.4 mg total) by mouth daily. 06/29/22   [name], NP    Physical Exam    Vital Signs:  Marris E [name] does not have vital signs available for review today.  Given telephonic nature of communication, physical exam is limited. AAOx3. NAD. Normal affect.  Speech and respirations are unlabored.  Accessory Clinical Findings    None  Assessment & Plan    1.  Preoperative Cardiovascular Risk Assessment: Colonoscopy, [facility], Dr. Chales Abrahams, fax #[phone]      Primary Cardiologist: [name], MD  Chart reviewed as part of pre-operative protocol coverage. Given past medical history and time since last visit, based on ACC/AHA guidelines, Tc E [name] would be at acceptable risk for the planned procedure without further cardiovascular testing.   Aspirin is not prescribed by cardiology.  Recommendations for holding aspirin will need to come from prescribing provider.  Patient was advised that if he develops new symptoms prior to surgery to contact our office to arrange a follow-up appointment.  He verbalized understanding.  I will route this recommendation to the requesting party via Epic fax function and remove from pre-op pool.       Time:   Today, I have spent 7 minutes with the patient with telehealth technology discussing medical history, symptoms, and management plan.  Prior to his phone evaluation I spent greater than 10 minutes  reviewing his past medical history and cardiac medications.   Ronney Asters, NP  08/12/2022, 7:58 AM

## 2022-08-13 ENCOUNTER — Other Ambulatory Visit: Payer: Self-pay

## 2022-08-13 ENCOUNTER — Telehealth: Payer: Self-pay

## 2022-08-13 NOTE — Telephone Encounter (Signed)
Left message for patient to call back  

## 2022-08-13 NOTE — Telephone Encounter (Signed)
Author: [name], MD Service: Gastroenterology Author Type: Physician  Filed: 08/12/2022  7:08 PM Encounter Date: 08/12/2022 Status: Signed  Editor: [name], MD (Physician)   Cleared by cardiology Proceed with EGD/colon as per last note I have copied Cathlyn Parsons as well.  I think he does meet [facility] criteria RG     Cathlyn Parsons, CRNA  [name], MD; Emeline Darling, RN; Meredith Pel, NP Dr. Chales Abrahams,  This pt is cleared for anesthetic care at [facility].  Thanks,  Cathlyn Parsons

## 2022-08-14 NOTE — Progress Notes (Signed)
Agree with assessment/plan.  Raj Anton Cheramie, MD [facility] [phone]  

## 2022-08-16 ENCOUNTER — Other Ambulatory Visit: Payer: Self-pay

## 2022-08-16 DIAGNOSIS — R195 Other fecal abnormalities: Secondary | ICD-10-CM

## 2022-08-16 DIAGNOSIS — D649 Anemia, unspecified: Secondary | ICD-10-CM

## 2022-08-16 NOTE — Telephone Encounter (Signed)
Patient is returning your call.  

## 2022-08-16 NOTE — Telephone Encounter (Signed)
Left message for patient to call back  

## 2022-08-16 NOTE — Telephone Encounter (Signed)
Spoke with patient regarding MD recommendations. Endo colon scheduled for 09/28/22 at 3:30 pm in the [facility] with Dr. Chales Abrahams. Amb ref placed. No blood thinners or diabetic per pt. Instructions discussed & sent to mychart and home address. Pt advised to call back with any further questions.

## 2022-09-10 ENCOUNTER — Encounter: Payer: Self-pay | Admitting: Gastroenterology

## 2022-09-21 ENCOUNTER — Telehealth: Payer: Self-pay | Admitting: Family

## 2022-09-21 ENCOUNTER — Ambulatory Visit (INDEPENDENT_AMBULATORY_CARE_PROVIDER_SITE_OTHER): Payer: Medicare Other | Admitting: Family

## 2022-09-21 VITALS — BP 124/72 | HR 73 | Temp 98.2°F | Resp 18 | Ht 71.0 in | Wt 218.8 lb

## 2022-09-21 DIAGNOSIS — E785 Hyperlipidemia, unspecified: Secondary | ICD-10-CM | POA: Diagnosis not present

## 2022-09-21 DIAGNOSIS — I639 Cerebral infarction, unspecified: Secondary | ICD-10-CM | POA: Diagnosis not present

## 2022-09-21 DIAGNOSIS — M069 Rheumatoid arthritis, unspecified: Secondary | ICD-10-CM

## 2022-09-21 DIAGNOSIS — N1831 Chronic kidney disease, stage 3a: Secondary | ICD-10-CM | POA: Diagnosis not present

## 2022-09-21 DIAGNOSIS — J45909 Unspecified asthma, uncomplicated: Secondary | ICD-10-CM

## 2022-09-21 DIAGNOSIS — G4733 Obstructive sleep apnea (adult) (pediatric): Secondary | ICD-10-CM

## 2022-09-21 DIAGNOSIS — G629 Polyneuropathy, unspecified: Secondary | ICD-10-CM

## 2022-09-21 DIAGNOSIS — I10 Essential (primary) hypertension: Secondary | ICD-10-CM | POA: Diagnosis not present

## 2022-09-21 MED ORDER — VITAMIN B-12 1000 MCG PO TABS: 1000.0000 ug | ORAL_TABLET | Freq: Every day | ORAL | Status: DC

## 2022-09-21 MED ORDER — GABAPENTIN 100 MG PO CAPS
100.0000 mg | ORAL_CAPSULE | Freq: Every day | ORAL | 1 refills | Status: DC
Start: 2022-09-21 — End: 2022-12-20

## 2022-09-21 NOTE — Progress Notes (Signed)
Subjective:     Patient ID: Brandon [name], male    DOB: 26-Mar-1940, 82 y.o.   MRN: [id number]  Chief Complaint  Patient presents with   Follow-up    Concerns/ questions: pt says he is scheduled for endoscopy/colonoscopy on 09/28/22 with dr Chales Abrahams. He wants to make sure he is okay to have it.     HPI  Discussed the use of AI scribe software for clinical note transcription with the patient, who gave verbal consent to proceed.  History of Present Illness   The patient, with a history of hypertension, asthma, and stroke, presents for a regular follow-up and to discuss an upcoming colonoscopy and endoscopy. He expresses concern about the anesthesia for the procedures, but the doctor reassures him that it is not full anesthesia, but heavy sedation.  The patient reports occasional shortness of breath and increased use of his rescue inhaler. He attributes this to not having fully recovered from a previous stroke, and he was told it could take up to a year to return to his previous state. He also reports occasional use of a CPAP machine since his stroke, as he felt claustrophobic with it.  The patient also mentions some problems with his kidneys, but he has an upcoming appointment with a specialist. He is currently taking leflunomide for arthritis, which seems to be working well. He also takes Lasix daily, which helps keep his swelling stable.  The patient reports a sensation in both feet, which he believes may be related to neuropathy. He describes it as a tingling sensation that is more pronounced at night in both feet.   The patient also mentions that he has been taking B12 daily  The patient is up to date on all preventive medications, except for the shingles shot, which he received at a local pharmacy.     Lab Results  Component Value Date   CHOL 188 04/18/2022   HDL 51 04/18/2022   LDLCALC 125 (H) 04/18/2022   TRIG 59 04/18/2022   CHOLHDL 3.7 04/18/2022     Lab Results   Component Value Date   PSA 1.06 06/29/2022   PSA 1.7 11/23/2019   PSA 3.40 01/30/2019       Health Maintenance Due  Topic Date Due   Zoster Vaccines- Shingrix (1 of 2) Never done   COVID-19 Vaccine (7 - 2023-24 season) 02/07/2022    Past Medical History:  Diagnosis Date   Anemia    Asthma    Hx of childhood asthma, disappeared for a while, then resurfaced 6-7 years ago.    Cardiomyopathy    with a negative cardiac catheterization in the past. 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     Past Surgical History:  Procedure Laterality Date   None      Family History  Problem Relation Age of Onset   Multiple myeloma Father    Lupus Sister        died at 36   Diabetes Mellitus II Sister        died from covid-19   Neuropathy Sister    Asthma Daughter    Colon cancer Neg Hx    Esophageal cancer Neg Hx    Stomach cancer Neg Hx  Pancreatic cancer Neg Hx     Social History   Socioeconomic History   Marital status: Married    Spouse name: [name]   Number of children: 3   Years of education: Not on file   Highest education level: Not on file  Occupational History   Occupation: retired  Tobacco Use   Smoking status: Former   Smokeless tobacco: Never   Tobacco comments:    quit smoling in 10/24/1988. started when 18, 1 ppd  Vaping Use   Vaping Use: Never used  Substance and Sexual Activity   Alcohol use: Yes    Comment: occ   Drug use: Never   Sexual activity: Not on file  Other Topics Concern   Not on file  Social History Narrative   Grew up in [place], attended [employer]. First wife died of breast ca in 1997-10-24. 3 children. Remarried- 8 years. 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    Pt signed designated party release  granting access to [facility] to his wife [name]. Detailed message may be left on home or cell phone. Roselle Locus August 13, 2009 11:34 am.    Social Determinants of Health   Financial Resource Strain: Medium Risk (03/19/2021)   Overall Financial Resource Strain (CARDIA)    Difficulty of Paying Living Expenses: Somewhat hard  Food Insecurity: No Food Insecurity (04/19/2022)   Hunger Vital Sign    Worried About Running Out of Food in the Last Year: Never true    Ran Out of Food in the Last Year: Never true  Transportation Needs: No Transportation Needs (04/20/2022)   PRAPARE - Administrator, Civil Service (Medical): No    Lack of Transportation (Non-Medical): No  Physical Activity: Insufficiently Active (07/25/2020)   Exercise Vital Sign    Days of Exercise per Week: 5 days    Minutes of Exercise per Session: 10 min  Stress: No Stress Concern Present (05/28/2021)   [employer] of Occupational Health - Occupational Stress Questionnaire    Feeling of Stress : Not at all  Social Connections: Socially Integrated (05/28/2021)   Social Connection and Isolation Panel [NHANES]    Frequency of Communication with Friends and Family: More than three times a week    Frequency of Social Gatherings with Friends and Family: Three times a week    Attends Religious Services: More than 4 times per year    Active Member of Clubs or Organizations: Yes    Attends Banker Meetings: More than 4 times per year    Marital Status: Married  Catering manager Violence: Not At Risk (04/19/2022)   Humiliation, Afraid, Rape, and Kick questionnaire    Fear of Current or Ex-Partner: No    Emotionally Abused: No    Physically Abused: No    Sexually Abused: No    Outpatient Medications Prior to Visit  Medication Sig Dispense Refill   albuterol (PROVENTIL) (2.5 MG/3ML) 0.083% nebulizer solution TAKE 3 MLS BY NEBULIZER EVERY 6 HOURS AS NEEDED FOR WHEEZING FOR SHORTNESS OF BREATH 150 mL 3   albuterol  (VENTOLIN HFA) 108 (90 Base) MCG/ACT inhaler Inhale 1-2 puffs into the lungs every 6 (six) hours as needed for wheezing or shortness of breath. 8 g 0   amLODipine (NORVASC) 10 MG tablet Take 1 tablet (10 mg total) by mouth daily. 90 tablet 0   aspirin EC 81 MG tablet Take 81 mg by mouth daily. Swallow whole.     fluticasone-salmeterol (  ADVAIR) 500-50 MCG/ACT AEPB Inhale 1 puff into the lungs in the morning and at bedtime. 60 each 6   furosemide (LASIX) 20 MG tablet Take 1 tablet (20 mg total) by mouth daily. 30 tablet 3   leflunomide (ARAVA) 20 MG tablet Take 20 mg by mouth daily.     metoprolol succinate (TOPROL-XL) 25 MG 24 hr tablet Take 1.5 tablets (37.5 mg total) by mouth daily. 150 tablet 1   montelukast (SINGULAIR) 10 MG tablet Take 1 tablet (10 mg total) by mouth at bedtime. 90 tablet 0   rosuvastatin (CRESTOR) 10 MG tablet Take 1 tablet (10 mg total) by mouth daily. 90 tablet 1   sacubitril-valsartan (ENTRESTO) 97-103 MG Take 1 tablet by mouth 2 (two) times daily. (For medication assistance program) 180 tablet 3   spironolactone (ALDACTONE) 25 MG tablet Take 1 tablet (25 mg total) by mouth daily. 90 tablet 1   tamsulosin (FLOMAX) 0.4 MG CAPS capsule Take 1 capsule (0.4 mg total) by mouth daily. 90 capsule 0   cyanocobalamin (VITAMIN B12) 500 MCG tablet Take 500 mcg by mouth daily.     cephALEXin (KEFLEX) 500 MG capsule Take 1 capsule (500 mg total) by mouth 4 (four) times daily. (Patient not taking: Reported on 09/21/2022) 20 capsule 0   No facility-administered medications prior to visit.    Allergies  Allergen Reactions   Ace Inhibitors Cough   Isosorb Dinitrate-Hydralazine Other (See Comments)    REACTION: dizziness/hypotensive    ROS See HPI    Objective:    Physical Exam Constitutional:      General: He is not in acute distress.    Appearance: He is well-developed.  HENT:     Head: Normocephalic and atraumatic.  Cardiovascular:     Rate and Rhythm: Normal rate and  regular rhythm.     Heart sounds: No murmur heard. Pulmonary:     Effort: Pulmonary effort is normal. No respiratory distress.     Breath sounds: Wheezing (soft bilateral expiratory wheezing) present. No rales.  Skin:    General: Skin is warm and dry.  Neurological:     Mental Status: He is alert and oriented to person, place, and time.     Comments: + sensation to monofilament dorsal and plantar foot right  Psychiatric:        Behavior: Behavior normal.        Thought Content: Thought content normal.      BP 124/72 (BP Location: Left Arm, Patient Position: Sitting, Cuff Size: Normal)   Pulse 73   Temp 98.2 F (36.8 C) (Oral)   Resp 18   Ht 5\' 11"  (1.803 m)   Wt 218 lb 12.8 oz (99.2 kg)   SpO2 97%   BMI 30.52 kg/m  Wt Readings from Last 3 Encounters:  09/21/22 218 lb 12.8 oz (99.2 kg)  08/05/22 222 lb (100.7 kg)  07/18/22 219 lb (99.3 kg)       Assessment & Plan:   Problem List Items Addressed This Visit       Unprioritized   Stage 3a chronic kidney disease (HCC)   Relevant Orders   CBC w/Diff   Basic Metabolic Panel (BMET)   Rheumatoid arthritis (HCC)    Well controlled on Leflunomide prescribed by Dr. Dierdre Forth. -Continue current regimen.      Obstructive sleep apnea    Encouraged increased compliance with cpap.       Neuropathy    Bilateral foot numbness, possibly related to lower back issues. -Start  low dose Gabapentin at bedtime. -Consider increasing to morning dose if effective and well tolerated.  ? If related to hx of b12 deficiency- continue b12 supplementation.       Relevant Medications   gabapentin (NEURONTIN) 100 MG capsule   Hyperlipidemia   Relevant Orders   Lipid panel   Essential hypertension    Well controlled on Amlodipine 10mg , Metoprolol 37.5mg , Aldactone 25mg , and Entresto. -Continue current regimen.      CVA (cerebral vascular accident) (HCC)    Continue crestor (LDL goal <70).  Update lipid panel, continue aspirin 81 mg.        Asthma - Primary    Stable with prn albuterol.        General Health Maintenance: -Continue Flomax for urinary symptoms. -Refill Advair for asthma control. -Check blood count today. -Update flu shot and COVID booster in the fall. -Consider RSV vaccine for cough prevention. -Continue B12 supplementation. -Plan for colonoscopy and endoscopy with propofol sedation. -Follow up in four months. I have discontinued Inioluwa Hartness. Lutze's cephALEXin and cyanocobalamin. I am also having him start on gabapentin and cyanocobalamin. Additionally, I am having him maintain his leflunomide, albuterol, furosemide, sacubitril-valsartan, rosuvastatin, aspirin EC, montelukast, albuterol, amLODipine, fluticasone-salmeterol, spironolactone, tamsulosin, and metoprolol succinate.  Meds ordered this encounter  Medications   gabapentin (NEURONTIN) 100 MG capsule    Sig: Take 1 capsule (100 mg total) by mouth at bedtime.    Dispense:  90 capsule    Refill:  1    Order Specific Question:   Supervising Provider    Answer:   Danise Edge A [[id number]]   cyanocobalamin (VITAMIN B12) 1000 MCG tablet    Sig: Take 1 tablet (1,000 mcg total) by mouth daily.    Order Specific Question:   Supervising Provider    Answer:   Danise Edge A [[id number]]

## 2022-09-21 NOTE — Assessment & Plan Note (Addendum)
Has some wheezing today, but clinically he feels well.  Oxygen saturation is 97%. Continue advair, singulair and prn albuterol.

## 2022-09-21 NOTE — Assessment & Plan Note (Signed)
Encouraged increased compliance with cpap.

## 2022-09-21 NOTE — Assessment & Plan Note (Signed)
Well controlled on Amlodipine 10mg , Metoprolol 37.5mg , Aldactone 25mg , and Entresto. -Continue current regimen.

## 2022-09-21 NOTE — Assessment & Plan Note (Signed)
Bilateral foot numbness, possibly related to lower back issues. -Start low dose Gabapentin at bedtime. -Consider increasing to morning dose if effective and well tolerated.  ? If related to hx of b12 deficiency- continue b12 supplementation.

## 2022-09-21 NOTE — Telephone Encounter (Signed)
Please call [facility] on [address] to request copy of the shingles shot.

## 2022-09-21 NOTE — Patient Instructions (Signed)
VISIT SUMMARY:  During your visit, we discussed several health issues including your high blood pressure, weight gain, low vitamin B12 levels, anemia, vitamin D deficiency, sleep apnea, constipation, and your recent urinary tract infection. We also talked about your recent hospitalization and the tests you underwent.  YOUR PLAN:  -HIGH BLOOD PRESSURE: Your blood pressure has been high even with medication. We're adding a new medication, Carvedilol, to help control it. Please monitor your blood pressure at home and follow up in 2 weeks.  -WEIGHT GAIN: Elvera Bicker been struggling with weight gain and difficulty losing weight. We're referring [facility] to the [facility] for a comprehensive weight management program. Please continue to exercise regularly and maintain a healthy diet.  -VITAMIN B12 DEFICIENCY: Your vitamin B12 levels are on the lower side of normal. We're recommending you start taking an over-the-counter B12 supplement daily. We'll recheck your B12 level in 3 months.  -IRON DEFICIENCY ANEMIA: Your anemia is stable, and we'll continue to monitor it.  -VITAMIN D DEFICIENCY: You've previously had low vitamin D levels. We're going to check your vitamin D level today.  -SLEEP APNEA: You've reported snoring and daytime fatigue. Please use your CPAP machine regularly for at least 4 hours per night to help manage these symptoms.I will also try to get you back in with your sleep specialist.    -CONSTIPATION: You were recently hospitalized with abdominal pain due to constipation. Please include more fiber in your diet to help manage this.  -URINARY TRACT INFECTION: You were recently hospitalized for a urinary tract infection, but your symptoms have resolved after completing a course of antibiotics. No further action is needed at this time.  -SCREENING FOR OCCULT GASTROINTESTINAL BLEEDING: Due to your stable anemia, we're providing a stool guaiac test kit to check for any hidden  blood in your stool.  INSTRUCTIONS:  Please start taking the new blood pressure medication, Carvedilol, and monitor your blood pressure at home. Follow up in 2 weeks. Start taking an over-the-counter B12 supplement daily. Use your CPAP machine regularly for at least 4 hours per night. Include more fiber in your diet. Use the stool guaiac test kit as instructed.

## 2022-09-21 NOTE — Assessment & Plan Note (Signed)
Continue crestor (LDL goal <70).  Update lipid panel, continue aspirin 81 mg.

## 2022-09-21 NOTE — Assessment & Plan Note (Addendum)
Well controlled on Leflunomide prescribed by Dr. Dierdre Forth. -Continue current regimen.

## 2022-09-22 LAB — CBC WITH DIFFERENTIAL/PLATELET
Basophils Absolute: 0.1 10*3/uL (ref 0.0–0.1)
Basophils Relative: 0.8 % (ref 0.0–3.0)
Eosinophils Absolute: 0.5 10*3/uL (ref 0.0–0.7)
Eosinophils Relative: 6.9 % — ABNORMAL HIGH (ref 0.0–5.0)
HCT: 35.9 % — ABNORMAL LOW (ref 39.0–52.0)
Hemoglobin: 11.8 g/dL — ABNORMAL LOW (ref 13.0–17.0)
Lymphocytes Relative: 13 % (ref 12.0–46.0)
Lymphs Abs: 0.9 10*3/uL (ref 0.7–4.0)
MCHC: 32.8 g/dL (ref 30.0–36.0)
MCV: 97.4 fl (ref 78.0–100.0)
Monocytes Absolute: 0.6 10*3/uL (ref 0.1–1.0)
Monocytes Relative: 8.8 % (ref 3.0–12.0)
Neutro Abs: 5 10*3/uL (ref 1.4–7.7)
Neutrophils Relative %: 70.5 % (ref 43.0–77.0)
Platelets: 179 10*3/uL (ref 150.0–400.0)
RBC: 3.69 Mil/uL — ABNORMAL LOW (ref 4.22–5.81)
RDW: 14.5 % (ref 11.5–15.5)
WBC: 7.1 10*3/uL (ref 4.0–10.5)

## 2022-09-22 LAB — LIPID PANEL
Cholesterol: 129 mg/dL (ref 0–200)
HDL: 53.3 mg/dL (ref >39.00)
LDL Cholesterol: 60 mg/dL (ref 0–99)
NonHDL: 75.41
Total CHOL/HDL Ratio: 2
Triglycerides: 78 mg/dL (ref 0.0–149.0)
VLDL: 15.6 mg/dL (ref 0.0–40.0)

## 2022-09-22 LAB — BASIC METABOLIC PANEL
BUN: 18 mg/dL (ref 6–23)
CO2: 27 mEq/L (ref 19–32)
Calcium: 9.6 mg/dL (ref 8.4–10.5)
Chloride: 106 mEq/L (ref 96–112)
Creatinine, Ser: 1.45 mg/dL (ref 0.40–1.50)
GFR: 45.12 mL/min — ABNORMAL LOW (ref >60.00)
Glucose, Bld: 83 mg/dL (ref 70–99)
Potassium: 4.5 mEq/L (ref 3.5–5.1)
Sodium: 141 mEq/L (ref 135–145)

## 2022-09-23 ENCOUNTER — Other Ambulatory Visit: Payer: Self-pay | Admitting: Family

## 2022-09-23 NOTE — Telephone Encounter (Signed)
Pt has only had 1- on 04/15/21. Vaccine documented.

## 2022-09-24 ENCOUNTER — Telehealth: Payer: Self-pay | Admitting: *Deleted

## 2022-09-24 DIAGNOSIS — M1612 Unilateral primary osteoarthritis, left hip: Secondary | ICD-10-CM | POA: Diagnosis not present

## 2022-09-24 NOTE — Telephone Encounter (Signed)
   Pre-operative Risk Assessment    Patient Name: Brandon [name]  DOB: [birth year] MRN: [id number]      Request for Surgical Clearance    Procedure:   LEFT TOTAL HIP REPLACEMENT  Date of Surgery:  Clearance TBD                                 Surgeon:  DR. Teryl Lucy Surgeon's Group or Practice Name:  [facility] Phone number:  [phone] EXT [phone] ATTN: Silvestre Mesi Fax number:  [phone]   Type of Clearance Requested:   - Medical ; ASA    Type of Anesthesia:  Spinal   Additional requests/questions:    Elpidio Anis   09/24/2022, 3:59 PM

## 2022-09-27 ENCOUNTER — Telehealth: Payer: Self-pay | Admitting: *Deleted

## 2022-09-27 NOTE — Telephone Encounter (Signed)
Pt has been scheduled for tele pre op appt 10/05/22 @ 2 pm. Med rec and consent are done. Pt states he is not sure if he is going to proceed with the surgery. I asked the pt to call us if he decides to not do the surgery and we will cancel the tele pre op appt. Pt said thank you for the help.     Patient Consent for Virtual Visit        Brandon [name] has provided verbal consent on 09/27/2022 for a virtual visit (video or telephone).   CONSENT FOR VIRTUAL VISIT FOR:  Brandon [name]  By participating in this virtual visit I agree to the following:  I hereby voluntarily request, consent and authorize [facility] and its employed or contracted physicians, physician assistants, nurse practitioners or other licensed health care professionals (the Practitioner), to provide me with telemedicine health care services (the "Services") as deemed necessary by the treating Practitioner. I acknowledge and consent to receive the Services by the Practitioner via telemedicine. I understand that the telemedicine visit will involve communicating with the Practitioner through live audiovisual communication technology and the disclosure of certain medical information by electronic transmission. I acknowledge that I have been given the opportunity to request an in-person assessment or other available alternative prior to the telemedicine visit and am voluntarily participating in the telemedicine visit.  I understand that I have the right to withhold or withdraw my consent to the use of telemedicine in the course of my care at any time, without affecting my right to future care or treatment, and that the Practitioner or I may terminate the telemedicine visit at any time. I understand that I have the right to inspect all information obtained and/or recorded in the course of the telemedicine visit and may receive copies of available information for a reasonable fee.  I understand that some of the potential risks of  receiving the Services via telemedicine include:  Delay or interruption in medical evaluation due to technological equipment failure or disruption; Information transmitted may not be sufficient (e.g. poor resolution of images) to allow for appropriate medical decision making by the Practitioner; and/or  In rare instances, security protocols could fail, causing a breach of personal health information.  Furthermore, I acknowledge that it is my responsibility to provide information about my medical history, conditions and care that is complete and accurate to the best of my ability. I acknowledge that Practitioner's advice, recommendations, and/or decision may be based on factors not within their control, such as incomplete or inaccurate data provided by me or distortions of diagnostic images or specimens that may result from electronic transmissions. I understand that the practice of medicine is not an exact science and that Practitioner makes no warranties or guarantees regarding treatment outcomes. I acknowledge that a copy of this consent can be made available to me via my patient portal [facility] MyChart), or I can request a printed copy by calling the office of [facility].    I understand that my insurance will be billed for this visit.   I have read or had this consent read to me. I understand the contents of this consent, which adequately explains the benefits and risks of the Services being provided via telemedicine.  I have been provided ample opportunity to ask questions regarding this consent and the Services and have had my questions answered to my satisfaction. I give my informed consent for the services to be provided through  the use of telemedicine in my medical care

## 2022-09-27 NOTE — Telephone Encounter (Signed)
Pt has been scheduled for tele pre op appt 10/05/22 @ 2 pm. Med rec and consent are done. Pt states he is not sure if he is going to proceed with the surgery. I asked the pt to call us if he decides to not do the surgery and we will cancel the tele pre op appt. Pt said thank you for the help.

## 2022-09-27 NOTE — Telephone Encounter (Signed)
   Name: Brandon [name]  DOB: 1940-09-05  MRN: [id number]  Primary Cardiologist: [name], MD   Preoperative team, please contact this patient and set up a phone call appointment for further preoperative risk assessment. Please obtain consent and complete medication review. Thank you for your help.  I confirm that guidance regarding antiplatelet and oral anticoagulation therapy has been completed and, if necessary, noted below.  His aspirin is not managed by cardiology.  Recommendations for holding aspirin will need to come from requesting provider.   Ronney Asters, NP 09/27/2022, 1:08 PM [facility]

## 2022-09-28 ENCOUNTER — Encounter: Payer: Self-pay | Admitting: Gastroenterology

## 2022-09-28 ENCOUNTER — Ambulatory Visit (AMBULATORY_SURGERY_CENTER): Payer: Medicare Other | Admitting: Gastroenterology

## 2022-09-28 VITALS — BP 121/63 | HR 59 | Temp 97.8°F | Resp 12 | Ht 71.0 in | Wt 222.0 lb

## 2022-09-28 DIAGNOSIS — D649 Anemia, unspecified: Secondary | ICD-10-CM

## 2022-09-28 DIAGNOSIS — K222 Esophageal obstruction: Secondary | ICD-10-CM | POA: Diagnosis not present

## 2022-09-28 DIAGNOSIS — K2970 Gastritis, unspecified, without bleeding: Secondary | ICD-10-CM | POA: Diagnosis not present

## 2022-09-28 DIAGNOSIS — D500 Iron deficiency anemia secondary to blood loss (chronic): Secondary | ICD-10-CM | POA: Diagnosis not present

## 2022-09-28 DIAGNOSIS — K219 Gastro-esophageal reflux disease without esophagitis: Secondary | ICD-10-CM

## 2022-09-28 DIAGNOSIS — D122 Benign neoplasm of ascending colon: Secondary | ICD-10-CM

## 2022-09-28 DIAGNOSIS — B9681 Helicobacter pylori [H. pylori] as the cause of diseases classified elsewhere: Secondary | ICD-10-CM | POA: Diagnosis not present

## 2022-09-28 DIAGNOSIS — K2951 Unspecified chronic gastritis with bleeding: Secondary | ICD-10-CM | POA: Diagnosis not present

## 2022-09-28 DIAGNOSIS — R195 Other fecal abnormalities: Secondary | ICD-10-CM

## 2022-09-28 DIAGNOSIS — D509 Iron deficiency anemia, unspecified: Secondary | ICD-10-CM | POA: Diagnosis not present

## 2022-09-28 DIAGNOSIS — G4733 Obstructive sleep apnea (adult) (pediatric): Secondary | ICD-10-CM | POA: Diagnosis not present

## 2022-09-28 MED ORDER — SODIUM CHLORIDE 0.9 % IV SOLN
500.0000 mL | Freq: Once | INTRAVENOUS | Status: DC
Start: 2022-09-28 — End: 2022-09-28

## 2022-09-28 MED ORDER — OMEPRAZOLE 40 MG PO CPDR
40.0000 mg | DELAYED_RELEASE_CAPSULE | Freq: Every day | ORAL | 4 refills | Status: DC
Start: 2022-09-28 — End: 2022-11-04

## 2022-09-28 NOTE — Progress Notes (Signed)
 Pt's states no medical or surgical changes since previsit or office visit. 

## 2022-09-28 NOTE — Progress Notes (Signed)
Sedate, gd SR, tolerated procedure well, VSS, report to RN 

## 2022-09-28 NOTE — Patient Instructions (Addendum)
Await pathology results.  No Ibuprofen, Naproxen, or other non-steroidal anti-inflammatory drugs.  Start Omeprazole 40mg  by mouth daily.  A prescription has been sent to your pharmacy.  Continue present medications. Resume previous diet.  YOU HAD AN ENDOSCOPIC PROCEDURE TODAY AT THE [facility]:   Refer to the procedure report that was given to you for any specific questions about what was found during the examination.  If the procedure report does not answer your questions, please call your gastroenterologist to clarify.  If you requested that your care partner not be given the details of your procedure findings, then the procedure report has been included in a sealed envelope for you to review at your convenience later.  YOU SHOULD EXPECT: Some feelings of bloating in the abdomen. Passage of more gas than usual.  Walking can help get rid of the air that was put into your GI tract during the procedure and reduce the bloating. If you had a lower endoscopy (such as a colonoscopy or flexible sigmoidoscopy) you may notice spotting of blood in your stool or on the toilet paper. If you underwent a bowel prep for your procedure, you may not have a normal bowel movement for a few days.  Please Note:  You might notice some irritation and congestion in your nose or some drainage.  This is from the oxygen used during your procedure.  There is no need for concern and it should clear up in a day or so.  SYMPTOMS TO REPORT IMMEDIATELY:  Following lower endoscopy (colonoscopy or flexible sigmoidoscopy):  Excessive amounts of blood in the stool  Significant tenderness or worsening of abdominal pains  Swelling of the abdomen that is new, acute  Fever of 100F or higher  Following upper endoscopy (EGD)  Vomiting of blood or coffee ground material  New chest pain or pain under the shoulder blades  Painful or persistently difficult swallowing  New shortness of breath  Fever of 100F or  higher  Black, tarry-looking stools  For urgent or emergent issues, a gastroenterologist can be reached at any hour by calling [phone]. Do not use MyChart messaging for urgent concerns.    DIET:  We do recommend a small meal at first, but then you may proceed to your regular diet.  Drink plenty of fluids but you should avoid alcoholic beverages for 24 hours.  ACTIVITY:  You should plan to take it easy for the rest of today and you should NOT DRIVE or use heavy machinery until tomorrow (because of the sedation medicines used during the test).    FOLLOW UP: Our staff will call the number listed on your records the next business day following your procedure.  We will call around 7:15- 8:00 am to check on you and address any questions or concerns that you may have regarding the information given to you following your procedure. If we do not reach you, we will leave a message.     If any biopsies were taken you will be contacted by phone or by letter within the next 1-3 weeks.  Please call us at [phone] if you have not heard about the biopsies in 3 weeks.    SIGNATURES/CONFIDENTIALITY: You and/or your care partner have signed paperwork which will be entered into your electronic medical record.  These signatures attest to the fact that that the information above on your After Visit Summary has been reviewed and is understood.  Full responsibility of the confidentiality of this discharge information lies  with you and/or your care-partner.

## 2022-09-28 NOTE — Op Note (Signed)
[facility] Patient Name: Brandon [name] Procedure Date: 09/28/2022 3:19 PM MRN: [id number] Endoscopist: [name] , MD, [id number] Age: 82 Referring MD:  Date of Birth: 01/01/1941 Gender: Male Account #: [id number] Procedure:                Colonoscopy Indications:              Iron deficiency anemia secondary to chronic blood                            loss. Colonoscopy is being performed after                            endoscopy. Medicines:                Monitored Anesthesia Care Procedure:                Pre-Anesthesia Assessment:                           - Prior to the procedure, a History and Physical                            was performed, and patient medications and                            allergies were reviewed. The patient's tolerance of                            previous anesthesia was also reviewed. The risks                            and benefits of the procedure and the sedation                            options and risks were discussed with the patient.                            All questions were answered, and informed consent                            was obtained. Prior Anticoagulants: The patient has                            taken no anticoagulant or antiplatelet agents. ASA                            Grade Assessment: III - A patient with severe                            systemic disease. After reviewing the risks and                            benefits, the patient was deemed in satisfactory  condition to undergo the procedure.                           After obtaining informed consent, the colonoscope                            was passed under direct vision. Throughout the                            procedure, the patient's blood pressure, pulse, and                            oxygen saturations were monitored continuously. The                            CF [id number] #[id number] was introduced through the anus                             and advanced to the 2 cm into the ileum. The                            colonoscopy was performed without difficulty. The                            patient tolerated the procedure well. The quality                            of the bowel preparation was adequate to identify                            polyps. The terminal ileum, ileocecal valve,                            appendiceal orifice, and rectum were photographed. Scope In: 3:47:29 PM Scope Out: 4:02:00 PM Scope Withdrawal Time: 0 hours 11 minutes 37 seconds  Total Procedure Duration: 0 hours 14 minutes 31 seconds  Findings:                 A 6 mm polyp was found in the proximal ascending                            colon. The polyp was sessile. The polyp was removed                            with a cold snare. Resection and retrieval were                            complete.                           Multiple medium-mouthed diverticula were found in                            the sigmoid colon, few in descending colon and  ascending colon.                           Non-bleeding internal hemorrhoids were found during                            retroflexion. The hemorrhoids were small and Grade                            I (internal hemorrhoids that do not prolapse).                           The terminal ileum appeared normal.                           The exam was otherwise without abnormality on                            direct and retroflexion views. Complications:            No immediate complications. Estimated Blood Loss:     Estimated blood loss: none. Impression:               - One 6 mm polyp in the proximal ascending colon,                            removed with a cold snare. Resected and retrieved.                           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                           - Non-bleeding internal  hemorrhoids.                           - The examined portion of the ileum was normal.                           - The examination was otherwise normal on direct                            and retroflexion views. Recommendation:           - Patient has a contact number available for                            emergencies. The signs and symptoms of potential                            delayed complications were discussed with the                            patient. Return to normal activities tomorrow.  Written discharge instructions were provided to the                            patient.                           - Resume previous diet.                           - Continue present medications.                           - Await pathology results.                           - Repeat colonoscopy is not recommended for                            surveillance.                           - If continues to have [name] requiring blood                            transfusions in future, then further evaluation.                           - The findings and recommendations were discussed                            with the patient's family. [name], MD 09/28/2022 4:10:10 PM This report has been signed electronically.

## 2022-09-28 NOTE — Progress Notes (Signed)
Assessment / Plan    Primary GI: [name], MD     82 year old male with multiple medical problems referred for Hemoccult positive stool.   Seen in 2021 for the same, EGD and colonoscopy never came to fruition for unclear reasons though it appears he was cleared by Cardiology.  He has occasional scant rectal bleeding with bowel movements, no other overt GI bleeding Plan:  -Will proceed with EGD and colonoscopy if Cardiology clears him for the procedures. The risks and benefits of EGD and colonoscopy with possible polypectomy / biopsies were discussed and the patient agrees to proceed.     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 Baseline hemoglobin 11.7   History of CVA.   Hospitalized in early February 2024 with what was felt to be a subacute infarct.     Chronic systolic heart failure. EF of 45 - 50%      History of Present Illness    Chief Complaint: none.       82 y.o. yo male with multiple medical problems not limited to chronic systolic heart failure, obesity, OSA on CPAP, hypertension, CKD 3 , rheumatoid arthritis, asthma, colon polyps, fatty liver. Patient is referred by PCP for evaluation of Hemoccult positive stool   Brandon [name] was last seen here February 2021,  at that time for evaluation of Hemoccult positive stool.  The plan was for an EGD and colonoscopy after cardiac evaluation.  It appears he was given clearance by cardiology but the procedures were not done  for unclear reasons ?     Brandon [name] has no GI complaints.  Occasionally he does have a scant amount of rectal bleeding with bowel movements even though he denies constipation or straining.        Latest Ref Rng & Units 04/19/2022    3:00 AM 04/17/2022    7:59 PM 03/22/2022   10:02 AM  CBC  WBC 4.0 - 10.5 K/uL 5.6  5.7  6.9   Hemoglobin 13.0 - 17.0 g/dL 63.8  75.6  43.3   Hematocrit 39.0 - 52.0 % 35.3  39.3  36.4   Platelets 150 - 400 K/uL 196  209  174.0       Recent Labs   No results found for: "LIPASE"       Latest Ref Rng & Units 04/19/2022    3:00 AM 04/17/2022    7:59 PM 03/30/2022    2:19 PM  CMP  Glucose 70 - 99 mg/dL 90  98  70   BUN 8 - 23 mg/dL 10  13  28    Creatinine 0.61 - 1.24 mg/dL 2.95  1.88  4.16   Sodium 135 - 145 mmol/L 140  136  139   Potassium 3.5 - 5.1 mmol/L 3.8  4.0  4.1   Chloride 98 - 111 mmol/L 109  106  106   CO2 22 - 32 mmol/L 25  25  26    Calcium 8.9 - 10.3 mg/dL 9.3  9.1  8.7       MR BRAIN W WO CONTRAST CLINICAL DATA:  Brain/CNS neoplasm, assess treatment response. Follow-up abnormal MRI last month.   EXAM: MRI HEAD WITHOUT AND WITH CONTRAST   TECHNIQUE: Multiplanar, multiecho pulse sequences of the brain and surrounding structures were obtained without and with intravenous contrast.   CONTRAST:  10mL GADAVIST GADOBUTROL 1 MMOL/ML IV SOLN   COMPARISON:  MRI brain 04/18/2022.   FINDINGS: Brain: The previously  questioned lesion in the right anterior thalamus is resolved to represent evolving infarct. Previously seen wispy enhancement has completely resolved and the lesion is demonstrating more fluid signal, consistent with evolving infarct. No acute infarct or intracranial hemorrhage. Underlying mild chronic small-vessel disease. No hydrocephalus or extra-axial collection. No mass effect or midline shift. No abnormal enhancement.   Vascular: Normal flow voids and enhancement.   Skull and upper cervical spine: Normal marrow signal and enhancement.   Sinuses/Orbits: Unremarkable.   Other: Unchanged left suboccipital scalp lipoma.   IMPRESSION: 1. The previously questioned lesion in the right anterior thalamus is resolved to represent evolving infarct. Previously seen wispy enhancement has completely resolved and the lesion is demonstrating more fluid signal, consistent with evolving infarct. 2. No acute intracranial process.   Electronically Signed   By: Orvan Falconer M.D.   On: 06/04/2022 11:34            Past Medical History:  Diagnosis Date   Anemia     Asthma      Hx of childhood asthma, disappeared for a while, then resurfaced 6-7 years ago.    Cardiomyopathy      with a negative cardiac catheterization in the past. (EF appriximately 40-45%)    Heme positive stool 02/01/2019   HTN (hypertension)      x 30 years   Obesity, unspecified     Pneumonia     Sleep apnea      CPAP   Unspecified disorder resulting from impaired renal function               Past Surgical History:  Procedure Laterality Date   None                 Family History  Problem Relation Age of Onset   Multiple myeloma Father     Lupus Sister          died at 42   Diabetes Mellitus II Sister          died from covid-19   Neuropathy Sister     Asthma Daughter     Colon cancer Neg Hx     Esophageal cancer Neg Hx          Social History  Social History         Tobacco Use   Smoking status: Former   Smokeless tobacco: Never   Tobacco comments:      quit smoling in 1990. started when 18, 1 ppd  Vaping Use   Vaping Use: Never used  Substance Use Topics   Alcohol use: Yes      Comment: occ   Drug use: Never            Current Outpatient Medications  Medication Sig Dispense Refill   albuterol (PROVENTIL) (2.5 MG/3ML) 0.083% nebulizer solution TAKE 3 MLS BY NEBULIZER EVERY 6 HOURS AS NEEDED FOR WHEEZING FOR SHORTNESS OF BREATH 150 mL 3   albuterol (VENTOLIN HFA) 108 (90 Base) MCG/ACT inhaler Inhale 1-2 puffs into the lungs every 6 (six) hours as needed for wheezing or shortness of breath. 8 g 0   amLODipine (NORVASC) 10 MG tablet Take 1 tablet (10 mg total) by mouth daily. 90 tablet 0   aspirin EC 81 MG tablet Take 81 mg by mouth daily. Swallow whole.       b complex vitamins tablet Take 1 tablet by mouth daily.       cephALEXin (KEFLEX) 500 MG capsule Take 1 capsule (500  mg total) by mouth 4 (four) times daily. 20 capsule 0   fluticasone-salmeterol (ADVAIR) 500-50 MCG/ACT AEPB  Inhale 1 puff into the lungs in the morning and at bedtime. 60 each 6   furosemide (LASIX) 20 MG tablet Take 1 tablet (20 mg total) by mouth daily. 30 tablet 3   leflunomide (ARAVA) 20 MG tablet Take 20 mg by mouth daily.       metoprolol succinate (TOPROL-XL) 25 MG 24 hr tablet Take 1.5 tablets (37.5 mg total) by mouth daily. 150 tablet 1   montelukast (SINGULAIR) 10 MG tablet Take 1 tablet (10 mg total) by mouth at bedtime. 90 tablet 0   rosuvastatin (CRESTOR) 10 MG tablet Take 1 tablet (10 mg total) by mouth daily. 90 tablet 1   sacubitril-valsartan (ENTRESTO) 97-103 MG Take 1 tablet by mouth 2 (two) times daily. (For medication assistance program) 180 tablet 3   spironolactone (ALDACTONE) 25 MG tablet Take 1 tablet (25 mg total) by mouth daily. 90 tablet 1   tamsulosin (FLOMAX) 0.4 MG CAPS capsule Take 1 capsule (0.4 mg total) by mouth daily. 90 capsule 0      No current facility-administered medications for this visit.      Allergies       Allergies  Allergen Reactions   Ace Inhibitors Cough   Isosorb Dinitrate-Hydralazine Other (See Comments)      REACTION: dizziness/hypotensive          Review of Systems: Positive for fatigue, arthritis, shortness of breath.  All other systems reviewed and negative except where noted in HPI.       Wt Readings from Last 3 Encounters:  07/18/22 219 lb (99.3 kg)  07/01/22 221 lb 6.4 oz (100.4 kg)  06/29/22 219 lb (99.3 kg)      Physical Exam:  BP 116/68   Pulse 68   Ht 5\' 11"  (1.803 m)   Wt 222 lb (100.7 kg)   SpO2 97%   BMI 30.96 kg/m  Constitutional:  Pleasant, generally well appearing male in no acute distress. Psychiatric:  Normal mood and affect. Behavior is normal. EENT: Pupils normal.  Conjunctivae are normal. No scleral icterus. Neck supple.  Cardiovascular: Normal rate, regular rhythm.  Pulmonary/chest: Effort normal and breath sounds normal. No wheezing, rales or rhonchi. Abdominal: Soft, nondistended, nontender. Bowel  sounds active throughout. There are no masses palpable. No hepatomegaly. Neurological: Alert and oriented to person place and time. Skin: Skin is warm and dry. No rashes noted.   Willette Cluster, NP  08/05/2022, 8:16 AM   Cc:  Referring Provider [name], NP     Attending physician's note   I have taken history, reviewed the chart and examined the patient. I performed a substantive portion of this encounter, including complete performance of at least one of the key components, in conjunction with the APP. I agree with the Advanced Practitioner's note, impression and recommendations.   For EGD/colon today No AC or Or any other blood thinners except bASA Hb 11.8 7/9   Edman Circle, MD [facility] [phone]

## 2022-09-28 NOTE — Progress Notes (Signed)
 Called to room to assist during endoscopic procedure.  Patient ID and intended procedure confirmed with present staff. Received instructions for my participation in the procedure from the performing physician.  

## 2022-09-28 NOTE — Op Note (Signed)
[facility] Patient Name: Brandon [name] Procedure Date: 09/28/2022 3:31 PM MRN: [id number] Endoscopist: [name] , MD, [id number] Age: 82 Referring MD:  Date of Birth: 02/12/1941 Gender: Male Account #: [id number] Procedure:                Upper GI endoscopy Indications:              GERD. [name] with heme positive stools Medicines:                Monitored Anesthesia Care Procedure:                Pre-Anesthesia Assessment:                           - Prior to the procedure, a History and Physical                            was performed, and patient medications and                            allergies were reviewed. The patient's tolerance of                            previous anesthesia was also reviewed. The risks                            and benefits of the procedure and the sedation                            options and risks were discussed with the patient.                            All questions were answered, and informed consent                            was obtained. Prior Anticoagulants: The patient has                            taken no anticoagulant or antiplatelet agents. ASA                            Grade Assessment: III - A patient with severe                            systemic disease. After reviewing the risks and                            benefits, the patient was deemed in satisfactory                            condition to undergo the procedure.                           After obtaining informed consent, the endoscope was  passed under direct vision. Throughout the                            procedure, the patient's blood pressure, pulse, and                            oxygen saturations were monitored continuously. The                            GIF [id number] #[id number] was introduced through the                            mouth, and advanced to the second part of duodenum.                            The upper GI  endoscopy was accomplished without                            difficulty. The patient tolerated the procedure                            well. 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 One wide open, benign-appearing,                            intrinsic mild stenosis was found 38 cm from the                            incisors. The stenosis was traversed.                           Diffuse moderate inflammation characterized by                            erosions, erythema, friability and granularity was                            found in the gastric body and antrum (D/d early                            GAVE). Biopsies were taken with a cold forceps for                            histology. The pylorus was deformed. No active                            ulcers.                           The examined duodenum was normal. Biopsies  for                            histology were taken with a cold forceps for                            evaluation of celiac disease. Complications:            No immediate complications. Estimated Blood Loss:     Estimated blood loss: none. Impression:               - Benign-appearing esophageal stenosis.                           - Gastritis. Biopsied.                           - Normal examined duodenum. Biopsied. Recommendation:           - Patient has a contact number available for                            emergencies. The signs and symptoms of potential                            delayed complications were discussed with the                            patient. Return to normal activities tomorrow.                            Written discharge instructions were provided to the                            patient.                           - Resume previous diet.                           - Start omeprazole 40 mg p.o. daily #90, 4RF                           -  Continue present medications.                           - No ibuprofen, naproxen, or other non-steroidal                            anti-inflammatory drugs.                           - Await pathology results.                           - Proceed with colonoscopy.                           - The findings  and recommendations were discussed                            with the patient's family. [name], MD 09/28/2022 4:05:27 PM This report has been signed electronically.

## 2022-09-29 ENCOUNTER — Telehealth: Payer: Self-pay | Admitting: *Deleted

## 2022-09-29 NOTE — Telephone Encounter (Signed)
  Follow up Call-     09/28/2022    2:50 PM  Call back number  Post procedure Call Back phone  # [phone]  Permission to leave phone message Yes     Patient questions:  Do you have a fever, pain , or abdominal swelling? No. Pain Score  0 *  Have you tolerated food without any problems? Yes.    Have you been able to return to your normal activities? Yes.    Do you have any questions about your discharge instructions: Diet   No. Medications  No. Follow up visit  No.  Do you have questions or concerns about your Care? No.  Actions: * If pain score is 4 or above: No action needed, pain <4.

## 2022-10-05 ENCOUNTER — Ambulatory Visit: Payer: Medicare Other | Attending: Cardiovascular Disease | Admitting: Nurse Practitioner

## 2022-10-05 DIAGNOSIS — Z01810 Encounter for preprocedural cardiovascular examination: Secondary | ICD-10-CM | POA: Diagnosis not present

## 2022-10-05 NOTE — Progress Notes (Signed)
Virtual Visit via Telephone Note   Because of Vaibhav Fogleman Isaacs's co-morbid illnesses, he is at least at moderate risk for complications without adequate follow up.  This format is felt to be most appropriate for this patient at this time.  The patient did not have access to video technology/had technical difficulties with video requiring transitioning to audio format only (telephone).  All issues noted in this document were discussed and addressed.  No physical exam could be performed with this format.  Please refer to the patient's chart for his consent to telehealth for [facility].  Evaluation Performed:  Preoperative cardiovascular risk assessment _____________   Date:  10/05/2022   Patient ID:  Brandon [name], DOB Nov 01, 1940, MRN [id number] Patient Location:  Home Provider location:   Office  Primary Care Provider:  [name], NP Primary Cardiologist:  [name], MD  Chief Complaint / Patient Profile   82 y.o. y/o male with a h/o chronic systolic heart failure, CVA, PVCs, SVT, hypertension, CKD stage III, and OSA who is pending L total hip replacement with Dr. Teryl Lucy of [facility] and presents today for telephonic preoperative cardiovascular risk assessment.  History of Present Illness    Brandon [name] is a 82 y.o. male who presents via audio/video conferencing for a telehealth visit today.  Pt was last seen in cardiology clinic on 06/04/2022 by Dr. Antoine Poche.  At that time Brandon [name] was doing well.  The patient is now pending procedure as outlined above. Since his last visit, he has done well from a cardiac standpoint.   He denies chest pain, palpitations, dyspnea, pnd, orthopnea, n, v, dizziness, syncope, edema, weight gain, or early satiety. All other systems reviewed and are otherwise negative except as noted above.   Past Medical History    Past Medical History:  Diagnosis Date   Anemia    Asthma    Hx of childhood  asthma, disappeared for a while, then resurfaced 6-7 years ago.    Cardiomyopathy    with a negative cardiac catheterization in the past. 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    Past Surgical History:  Procedure Laterality Date   None      Allergies  Allergies  Allergen Reactions   Ace Inhibitors Cough   Isosorb Dinitrate-Hydralazine Other (See Comments)    REACTION: dizziness/hypotensive    Home Medications    Prior to Admission medications   Medication Sig Start Date End Date Taking? Authorizing Provider  albuterol (PROVENTIL) (2.5 MG/3ML) 0.083% nebulizer solution TAKE 3 MLS BY NEBULIZER EVERY 6 HOURS AS NEEDED FOR WHEEZING FOR SHORTNESS OF BREATH 02/23/22   [name], NP  albuterol (VENTOLIN HFA) 108 (90 Base) MCG/ACT inhaler Inhale 1-2 puffs into the lungs every 6 (six) hours as needed for wheezing or shortness of breath. 06/29/22   [name], NP  amLODipine (NORVASC) 10 MG tablet Take 1 tablet (10 mg total) by mouth daily. 06/29/22   [name], NP  aspirin EC 81 MG tablet Take 81 mg by mouth daily. Swallow whole.    [provider]  cyanocobalamin (VITAMIN B12) 1000 MCG tablet Take 1 tablet (1,000 mcg total) by mouth daily. 09/21/22   [name], NP  fluticasone-salmeterol (ADVAIR) 500-50 MCG/ACT AEPB Inhale 1 puff into the lungs in the morning and at bedtime. 06/29/22   [name], NP  furosemide (LASIX) 20 MG tablet Take 1 tablet (20 mg total) by mouth daily. 03/30/22   [name], NP  gabapentin (NEURONTIN) 100 MG capsule Take 1 capsule (100 mg total) by mouth at bedtime. 09/21/22   [name], NP  leflunomide (ARAVA) 20 MG tablet Take 20 mg by mouth daily. 05/31/19   [provider]   metoprolol succinate (TOPROL-XL) 25 MG 24 hr tablet Take 1.5 tablets (37.5 mg total) by mouth daily. 08/04/22   Wanda Plump, MD  montelukast (SINGULAIR) 10 MG tablet Take 1 tablet (10 mg total) by mouth at bedtime. 09/23/22   [name], NP  omeprazole (PRILOSEC) 40 MG capsule Take 1 capsule (40 mg total) by mouth daily. 09/28/22   [name], MD  rosuvastatin (CRESTOR) 10 MG tablet Take 1 tablet (10 mg total) by mouth daily. 05/10/22   Wanda Plump, MD  sacubitril-valsartan (ENTRESTO) 97-103 MG Take 1 tablet by mouth 2 (two) times daily. (For medication assistance program) 04/30/22   [name], MD  spironolactone (ALDACTONE) 25 MG tablet Take 1 tablet (25 mg total) by mouth daily. 06/29/22   [name], NP  tamsulosin (FLOMAX) 0.4 MG CAPS capsule Take 1 capsule (0.4 mg total) by mouth daily. 06/29/22   [name], NP    Physical Exam    Vital Signs:  Brandon [name] does not have vital signs available for review today.  Given telephonic nature of communication, physical exam is limited. AAOx3. NAD. Normal affect.  Speech and respirations are unlabored.  Accessory Clinical Findings    None  Assessment & Plan    1.  Preoperative Cardiovascular Risk Assessment:  According to the Revised Cardiac Risk Index (RCRI), his Perioperative Risk of Major Cardiac Event is (%): 6.6. His Functional Capacity in METs is: 6.45 according to the Duke Activity Status Index (DASI). Therefore, based on ACC/AHA guidelines, patient would be at acceptable risk for the planned procedure without further cardiovascular testing. The patient was advised that if he develops new symptoms prior to surgery to contact our office to arrange for a follow-up visit, and he verbalized understanding.  His aspirin is not managed by cardiology.  Recommendations for holding aspirin will need to come from managing provider.    A copy of this note will be routed to requesting surgeon.  Time:   Today,  I have spent 5 minutes with the patient with telehealth technology discussing medical history, symptoms, and management plan.     Joylene Grapes, NP  10/05/2022, 2:07 PM

## 2022-10-07 ENCOUNTER — Encounter: Payer: Self-pay | Admitting: Gastroenterology

## 2022-10-07 NOTE — Progress Notes (Signed)
Please inform the patient. Gastric biopsies were positive for H. Pylori  Brandon [name], can you let the pt know that gastric bx are + for H pylori. Would recommend the following treatment regimen for 14 days: Amoxicillin 1gm BID Clarithromycin 500mg  BID Flagyl 500mg  BID Omeprazole 40 BID Then can continue omeprazole 20 mg p.o. daily  Hold Crestor while taking above medications.  Once done, can restart  Can you please order this if no signfiicant interactions noted. Once done with therapy, the patient should wait one month and perform a stool study for H pylori antigen to ensure negative. The PPI needs to be held at least 2 weeks prior to submitting the stool sample. The patient should avoid alcohol while taking Flagyl.   Thanks  Dr Chales Abrahams   Send report to family physician

## 2022-10-08 ENCOUNTER — Other Ambulatory Visit: Payer: Self-pay

## 2022-10-08 DIAGNOSIS — B9681 Helicobacter pylori [H. pylori] as the cause of diseases classified elsewhere: Secondary | ICD-10-CM

## 2022-10-09 ENCOUNTER — Other Ambulatory Visit: Payer: Self-pay | Admitting: Family

## 2022-11-04 ENCOUNTER — Other Ambulatory Visit: Payer: Self-pay

## 2022-11-04 ENCOUNTER — Telehealth: Payer: Self-pay

## 2022-11-04 DIAGNOSIS — B9681 Helicobacter pylori [H. pylori] as the cause of diseases classified elsewhere: Secondary | ICD-10-CM

## 2022-11-04 DIAGNOSIS — K219 Gastro-esophageal reflux disease without esophagitis: Secondary | ICD-10-CM

## 2022-11-04 MED ORDER — METRONIDAZOLE 500 MG PO TABS: 500.0000 mg | ORAL_TABLET | Freq: Two times a day (BID) | ORAL | 0 refills | Status: AC

## 2022-11-04 MED ORDER — OMEPRAZOLE 40 MG PO CPDR: 40.0000 mg | DELAYED_RELEASE_CAPSULE | Freq: Two times a day (BID) | ORAL | 0 refills | Status: DC

## 2022-11-04 MED ORDER — DOXYCYCLINE HYCLATE 100 MG PO CAPS: 100.0000 mg | ORAL_CAPSULE | Freq: Two times a day (BID) | ORAL | 0 refills | Status: AC

## 2022-11-04 MED ORDER — OMEPRAZOLE 20 MG PO CPDR: 20.0000 mg | DELAYED_RELEASE_CAPSULE | Freq: Every day | ORAL | 3 refills | Status: DC

## 2022-11-04 MED ORDER — DOXYCYCLINE HYCLATE 100 MG PO CAPS: 100.0000 mg | ORAL_CAPSULE | Freq: Two times a day (BID) | ORAL | 0 refills | Status: DC

## 2022-11-04 MED ORDER — AMOXICILLIN 500 MG PO TABS: 1000.0000 mg | ORAL_TABLET | Freq: Two times a day (BID) | ORAL | 0 refills | Status: AC

## 2022-11-04 NOTE — Telephone Encounter (Signed)
Brandon Bologna, MD  Brandon Darling, RN Please inform the patient. Gastric biopsies were positive for H. Pylori  Brandon [name], can you let the pt know that gastric bx are + for H pylori. Would recommend the following treatment regimen for 14 days: Amoxicillin 1gm BID Clarithromycin 500mg  BID Flagyl 500mg  BID Omeprazole 40 BID Then can continue omeprazole 20 mg p.o. daily  Hold Crestor while taking above medications.  Once done, can restart  Can you please order this if no signfiicant interactions noted. Once done with therapy, the patient should wait one month and perform a stool study for H pylori antigen to ensure negative. The PPI needs to be held at least 2 weeks prior to submitting the stool sample. The patient should avoid alcohol while taking Flagyl.  Thanks  Dr Chales Abrahams   Send report to family physician

## 2022-11-04 NOTE — Addendum Note (Signed)
Addended by: Chaney Malling on: 11/04/2022 04:31 PM   Modules accepted: Orders

## 2022-11-04 NOTE — Addendum Note (Signed)
Addended by: Chaney Malling on: 11/04/2022 04:36 PM   Modules accepted: Orders

## 2022-11-04 NOTE — Telephone Encounter (Signed)
Spoke with patient regarding results & recommendations. Prescriptions sent to pharmacy. Stool sample ordered. Advised him I would call to give him a reminder of when to come in for stool sample. Staff reminder sent to self. Patient had no further questions & verbalized all understanding.

## 2022-11-04 NOTE — Telephone Encounter (Signed)
Lets do doxycycline 100 twice daily instead of clarithromycin x 14 days RG

## 2022-11-04 NOTE — Telephone Encounter (Signed)
Called patient back & clarified that I sent in 40 mg omeprazole BID to be taken for 14 days along with the antibiotics, and then a separate 20 mg omeprazole to be taken daily after finishing H Pylori regimen. Pt verbalized all understanding.

## 2022-11-08 ENCOUNTER — Telehealth: Payer: Self-pay | Admitting: Family

## 2022-11-08 ENCOUNTER — Other Ambulatory Visit: Payer: Self-pay | Admitting: Family

## 2022-11-08 NOTE — Telephone Encounter (Signed)
PT called to have a nurse explain how to take his medications because he is really confused on what to do. Please advise

## 2022-11-08 NOTE — Telephone Encounter (Signed)
Pt said he has been trying to reach Dr. Urban Gibson office about the 5 prescriptions sent in for him. Pt said he needs some clarification but he cannot get through to their office. Pt said he held 25 minutes before his call was disconnected. He would like assistance getting through to their office to have them call him and provide him with instructions. Please call pt to assist.

## 2022-11-09 NOTE — Telephone Encounter (Signed)
LVM for patient to call back. It went straight to voicemail. Looks like patient was calling regarding his treatment

## 2022-11-09 NOTE — Telephone Encounter (Signed)
Pt stated that he had questions about the prescription omeprazole. Questions were answered. Pt verbalized understanding with all questions answered.

## 2022-11-10 ENCOUNTER — Ambulatory Visit (INDEPENDENT_AMBULATORY_CARE_PROVIDER_SITE_OTHER): Payer: Medicare Other | Admitting: Pharmacist

## 2022-11-10 DIAGNOSIS — I10 Essential (primary) hypertension: Secondary | ICD-10-CM

## 2022-11-10 DIAGNOSIS — I5022 Chronic systolic (congestive) heart failure: Secondary | ICD-10-CM

## 2022-11-10 DIAGNOSIS — J45909 Unspecified asthma, uncomplicated: Secondary | ICD-10-CM

## 2022-11-10 DIAGNOSIS — E785 Hyperlipidemia, unspecified: Secondary | ICD-10-CM

## 2022-11-10 MED ORDER — ROSUVASTATIN CALCIUM 10 MG PO TABS: 10.0000 mg | ORAL_TABLET | Freq: Every day | ORAL | 1 refills | Status: DC

## 2022-11-10 NOTE — Progress Notes (Signed)
Pharmacy Note  11/10/2022 Name: Brandon [name] MRN: [id number] DOB: 09/02/1940  Subjective: Brandon [name] is a 82 y.o. year old male who is a primary care patient of [name], NP. Clinical Pharmacist Practitioner referral was placed to assist with medication management.    Engaged with patient by telephone for follow up visit today.  CHF: Current treatment - Entresto 97/109mg  twice a day, fuorsemide 20mg  daily if needed, metoprolol succinate ER 25mg  - take 1.5 tablets = 37.5mg  daily, spironolactone 25mg  daily.   Patient denied edema, has not needed to take furosemide in several months. He does have occasional shortness of breath which is attributes to asthma. Usually relieved with rescue inhaler use.   Wt Readings from Last 3 Encounters:  09/28/22 222 lb (100.7 kg)  09/21/22 218 lb 12.8 oz (99.2 kg)  08/05/22 222 lb (100.7 kg)    Hypertension:  Taking Entresto, metoprolol ER 25mg  daily and amlodipine 10mg  daily.  Not checking blood pressure at home but office blood pressure has been at goal. \ BP Readings from Last 3 Encounters:  09/28/22 121/63  09/21/22 124/72  08/05/22 116/68    CVA: Patient was hospitalized 04/18/22 to 04/17/2022 for CVA after he presented to ED with dizziness. Brain MRI showed a focal area of signal abormaility in the ventral R thalamus (likely recent subacute infarct). Radiologist recommended a follow up enhanced brain MRI in a few weeks. The stroke team recommended to continue ASA 81 mg PO daily, a Zio patch (placed 04/19/2022) and started crestor 10 mg PO daily.  Stroke team did not recommend plavix at this time. Per Dr Hochrein's notes, Zio patch did not show sustained afib.    Asthma:   Maintenance inhaler - fluticasone / salmeterol 500/52mcg - 1 puff twice a day Montelukast 10mg  daily  Rescue inhaler - albuterol  Patient reports using rescue inhaler 2 to 3 times per week.   Hyperlipidemia:  Current medication: rosuvastatin 10mg   daily - currently on hold for 14 days while he is being treated for H-Pylori infection. Will restart rosuvastatin 11/20/2022 LDL decreased from 125 to 60 since rosuvastatin started.    Objective: Review of patient status, including review of consultants reports, laboratory and other test data, was performed as part of comprehensive.  Lab Results  Component Value Date   CREATININE 1.45 09/21/2022   CREATININE 1.31 (H) 04/19/2022   CREATININE 1.28 (H) 04/17/2022    Lab Results  Component Value Date   HGBA1C 5.1 04/18/2022       Component Value Date/Time   CHOL 129 09/21/2022 1534   TRIG 78.0 09/21/2022 1534   HDL 53.30 09/21/2022 1534   CHOLHDL 2 09/21/2022 1534   VLDL 15.6 09/21/2022 1534   LDLCALC 60 09/21/2022 1534     Clinical ASCVD: Yes  The ASCVD Risk score (Arnett DK, et al., 2019) failed to calculate for the following reasons:   The 2019 ASCVD risk score is only valid for ages 42 to 10   The patient has a prior MI or stroke diagnosis    BP Readings from Last 3 Encounters:  09/28/22 121/63  09/21/22 124/72  08/05/22 116/68     Allergies  Allergen Reactions   Ace Inhibitors Cough   Isosorb Dinitrate-Hydralazine Other (See Comments)    REACTION: dizziness/hypotensive    Medications Reviewed Today     Reviewed by Henrene Pastor, RPH-CPP (Pharmacist) on 11/10/22 at 1309  Med List Status: <None>   Medication Order Taking? Sig Documenting Provider Last Dose  Status Informant  albuterol (PROVENTIL) (2.5 MG/3ML) 0.083% nebulizer solution [id number] No TAKE 3 MLS BY NEBULIZER EVERY 6 HOURS AS NEEDED FOR WHEEZING FOR SHORTNESS OF BREATH  Patient not taking: Reported on 11/10/2022   [name], NP Not Taking Active Self  albuterol (VENTOLIN HFA) 108 (90 Base) MCG/ACT inhaler [id number] Yes Inhale 1-2 puffs into the lungs every 6 (six) hours as needed for wheezing or shortness of breath. [name], NP Taking Active   amLODipine (NORVASC) 10 MG tablet  [id number] Yes Take 1 tablet by mouth once daily Worthy Rancher B, FNP Taking Active   amoxicillin (AMOXIL) 500 MG tablet [id number] Yes Take 2 tablets (1,000 mg total) by mouth 2 (two) times daily for 14 days. [name], MD Taking Active   aspirin EC 81 MG tablet [id number] Yes Take 81 mg by mouth daily. Swallow whole. [provider] Taking Active   cyanocobalamin (VITAMIN B12) 1000 MCG tablet [id number] Yes Take 1 tablet (1,000 mcg total) by mouth daily. [name], NP Taking Active   doxycycline (VIBRAMYCIN) 100 MG capsule [id number] Yes Take 1 capsule (100 mg total) by mouth 2 (two) times daily for 14 days. [name], MD Taking Active   fluticasone-salmeterol (ADVAIR) 500-50 MCG/ACT AEPB [id number] Yes Inhale 1 puff into the lungs in the morning and at bedtime. [name], NP Taking Active   furosemide (LASIX) 20 MG tablet [id number] No Take 1 tablet (20 mg total) by mouth daily.  Patient not taking: Reported on 11/10/2022   [name], NP Not Taking Active Self           Med Note Clydie Braun, [place] B   Wed Aug 04, 2022  1:30 PM)    gabapentin (NEURONTIN) 100 MG capsule [id number] Yes Take 1 capsule (100 mg total) by mouth at bedtime. [name], NP Taking Active   leflunomide (ARAVA) 20 MG tablet [id number] Yes Take 20 mg by mouth daily. [provider] Taking Active Self  metoprolol succinate (TOPROL-XL) 25 MG 24 hr tablet [id number] Yes Take 1.5 tablets (37.5 mg total) by mouth daily. Wanda Plump, MD Taking Active   metroNIDAZOLE (FLAGYL) 500 MG tablet [id number] Yes Take 1 tablet (500 mg total) by mouth 2 (two) times daily for 14 days. [name], MD Taking Active   montelukast (SINGULAIR) 10 MG tablet [id number] Yes Take 1 tablet (10 mg total) by mouth at bedtime. [name], NP Taking Active   omeprazole (PRILOSEC) 20 MG capsule [id number] No Take 1 capsule (20 mg total) by mouth daily.  Patient not taking: Reported on 11/10/2022    [name], MD Not Taking Active   omeprazole (PRILOSEC) 40 MG capsule [id number] Yes Take 1 capsule (40 mg total) by mouth 2 (two) times daily for 14 days. [name], MD Taking Active   rosuvastatin (CRESTOR) 10 MG tablet [id number] No Take 1 tablet (10 mg total) by mouth daily.  Patient not taking: Reported on 11/10/2022   Wanda Plump, MD Not Taking Active            Med Note Clydie Braun, Glenna Durand   Wed Nov 10, 2022  1:09 PM) On hold 11/06/2022 thru 11/20/2022   sacubitril-valsartan (ENTRESTO) 97-103 MG [id number] Yes Take 1 tablet by mouth 2 (two) times daily. (For medication assistance program) [name], MD Taking Active   spironolactone (ALDACTONE) 25 MG tablet [id number] Yes Take 1 tablet (25 mg total) by mouth daily. [name], NP Taking Active   tamsulosin (FLOMAX) 0.4 MG CAPS capsule [id number] Yes  Take 1 capsule (0.4 mg total) by mouth daily. 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   Coronary artery calcification 03/30/2022   Nasal mass 06/02/2021   B12 deficiency 08/13/2020   Rheumatoid arthritis (HCC) 08/12/2020   BPH (benign prostatic hyperplasia) 08/12/2020   Folic acid deficiency 08/12/2020   Stage 3a chronic kidney disease (HCC) 10/17/2019   Heme positive stool 02/01/2019   RBBB 10/03/2018   Depression 12/09/2015   Asthma 08/13/2009   Hyperglycemia 08/13/2009   Obstructive sleep apnea 01/23/2009   CHRONIC SYSTOLIC HEART FAILURE 09/11/2008   Obesity, unspecified 08/26/2008   Disorder resulting from impaired renal function 08/26/2008   Essential hypertension 06/28/2008   Nonischemic cardiomyopathy (HCC) 06/28/2008     Assessment / Plan: CHF:  Continue  current medications - Entresto, furosemide, metoprolol and spironolactone.  Continue to weigh daily - report weight gain of more than 3 lbs in 24 hours or 5 lbs in 1 week.  Hypertension / CVA / hyperlipidemia :  Check blood pressure at home once daily and record.  Continue current medications to lower blood pressure.  Continue rosuvastatin 10mg  daily - goal LDL < 55.  If LDL above goal with next check would recommend increasing to 20mg  daily  Continue aspirin 81mg  daily  Medication Assistance:  Approved for [employer] 07/05/2022 thru 07/05/2023. .     Medication management:  Reviewed and updated medication list Reviewed refill history and adherence. He is due to refill rosuvastatin but since he has been holding dose, he still has about 1 week left.  Sent in updated prescription for rosuvastatin.  Meds ordered this encounter  Medications   rosuvastatin (CRESTOR) 10 MG tablet    Sig: Take 1 tablet (10 mg total) by mouth daily.    Dispense:  90 tablet    Refill:  1     Asthma:  Continue fluticasone / salmeterol 500/65mcg - 1 puff twice a day, Montelukast 10mg  daily  and rescue inhaler - albuterol   Follow Up:  Telephone follow up appointment with care management team member scheduled for:  3 months.    Henrene Pastor, PharmD Clinical Pharmacist [facility]  - [facility] [phone]

## 2022-11-11 NOTE — Telephone Encounter (Signed)
Refer to phone note 11/04/22. Brandon Spare, RN clarified medication instructions with patient.

## 2022-11-30 ENCOUNTER — Ambulatory Visit (HOSPITAL_BASED_OUTPATIENT_CLINIC_OR_DEPARTMENT_OTHER)
Admission: RE | Admit: 2022-11-30 | Discharge: 2022-11-30 | Disposition: A | Discharge status: Home / Self Care | Payer: Medicare Other | Source: Ambulatory Visit | Attending: Family | Admitting: Family

## 2022-11-30 ENCOUNTER — Ambulatory Visit (INDEPENDENT_AMBULATORY_CARE_PROVIDER_SITE_OTHER): Payer: Medicare Other | Admitting: Family

## 2022-11-30 ENCOUNTER — Telehealth: Payer: Self-pay | Admitting: Family

## 2022-11-30 VITALS — BP 124/85 | HR 85 | Temp 98.2°F | Resp 18 | Wt 222.0 lb

## 2022-11-30 DIAGNOSIS — J9811 Atelectasis: Secondary | ICD-10-CM | POA: Diagnosis not present

## 2022-11-30 DIAGNOSIS — R918 Other nonspecific abnormal finding of lung field: Secondary | ICD-10-CM | POA: Diagnosis not present

## 2022-11-30 DIAGNOSIS — R0602 Shortness of breath: Secondary | ICD-10-CM

## 2022-11-30 DIAGNOSIS — J45901 Unspecified asthma with (acute) exacerbation: Secondary | ICD-10-CM | POA: Insufficient documentation

## 2022-11-30 DIAGNOSIS — J45909 Unspecified asthma, uncomplicated: Secondary | ICD-10-CM | POA: Diagnosis not present

## 2022-11-30 MED ORDER — PREDNISONE 10 MG PO TABS: ORAL_TABLET | ORAL | 0 refills | Status: DC

## 2022-11-30 NOTE — Progress Notes (Unsigned)
Cardiology Office Note:   Date:  12/01/2022  ID:  Brandon [name], DOB [birth year], MRN [id number] PCP: [name], NP  [facility] Providers Cardiologist:  [name], MD {  History of Present Illness:   Brandon [name] is a 82 y.o. male who presents for follow up of cardiomyopathy with an EF in the past of 45 - 50%.  Since I last saw him he was in the hospital with a CVA.  He did follow-up once in the office after this.  I reviewed these records because it was not clear what the etiology was.  There was a right thalamic lesion.  It was not clear that it was embolic.  It was thought maybe to be a possible mass but follow-up did not suggest this.  He actually was referred to oncology and I reviewed these records and I did not think there was any evidence of malignancy.  He resolved except for some mild lower extremity weakness which he more ascribes to his arthritis.  He did wear a monitor afterwards for 2 weeks but there was no evidence of atrial fibrillation.  There was some SVT.  He is otherwise not had any cardiovascular symptoms.  He has not had any chest pressure, neck or arm discomfort.  Has had no weight gain or edema.  He is no longer needing to follow-up with neurology per their suggestion.  There was no request for an implanted loop.   ROS: As stated in the HPI and negative for all other systems.  Studies Reviewed:    EKG:   NA  Risk Assessment/Calculations:              Physical Exam:   VS:  BP 112/80   Pulse 80   Ht 5\' 11"  (1.803 m)   Wt 222 lb 3.2 oz (100.8 kg)   SpO2 96%   BMI 30.99 kg/m    Wt Readings from Last 3 Encounters:  12/01/22 222 lb 3.2 oz (100.8 kg)  11/30/22 222 lb (100.7 kg)  09/28/22 222 lb (100.7 kg)     GEN: Well nourished, well developed in no acute distress NECK: No JVD; No carotid bruits CARDIAC: RRR, no murmurs, rubs, gallops RESPIRATORY:  Clear to auscultation without rales, wheezing or rhonchi  ABDOMEN: Soft,  non-tender, non-distended EXTREMITIES:  Mild leg edema; No deformity   ASSESSMENT AND PLAN:    CVA: He has no significant residual.  No change in therapy.  At this point after reviewing the record I do not see an indication for implanted loop as this was not thought to be embolic.  Chronic systolic heart failure: EF was mildly reduced as above.  He is euvolemic and tolerating the meds as listed.  Blood pressure 1 allow further med titration.  No change in therapy.  PVCs:    He also had SVT.  He has not had any symptoms related to this.  No change in therapy.  Hypertension:     This is being managed in the context of treating his CHF   Obstructive sleep apnea: He uses CPAP.  No change in therapy.  CKD stage III:   Creatinine was 1.45 which is up slightly from previous.  This can be followed up by his primary provider.  PREOP:  He might get hip surgery.  If he does have surgery soon he would be at acceptable risk for the surgery from a cardiac standpoint.     Follow up with APP in six months.  Signed, [name], MD

## 2022-11-30 NOTE — Patient Instructions (Signed)
VISIT SUMMARY:  During your visit, we discussed your recent difficulties with asthma, changes in your rheumatoid arthritis symptoms, and your potential orthopedic surgery. We also talked about your general health and upcoming appointments.  YOUR PLAN:  -ASTHMA: Your asthma has been causing you more trouble recently. We're going to start you on a course of Prednisone, a medication that reduces inflammation in your lungs. Continue using your Albuterol inhaler as needed. We're also going to get a chest X-ray to make sure there's no pneumonia or fluid in your lungs. If your symptoms get worse or don't improve in the next few days, please go to the emergency room.  -RHEUMATOID ARTHRITIS: You've noticed some changes in your rheumatoid arthritis symptoms. Rheumatoid arthritis is a condition that causes inflammation in your joints. You have a follow-up appointment with your rheumatologist, and we're not making any changes to your treatment plan at this time.  -POTENTIAL ORTHOPEDIC SURGERY: You're considering having orthopedic surgery. If you decide to go ahead with the surgery, we'll need to schedule a pre-op visit to make sure you're healthy enough for the procedure.  -GENERAL HEALTH MAINTENANCE: Your next routine follow-up visit is scheduled for January 25, 2023. We also talked about getting your flu shot next week. You've already received your COVID booster, which is great.  INSTRUCTIONS:  Remember to start your Prednisone course and continue using your Albuterol inhaler as needed. Get your chest X-ray as soon as possible. If your asthma symptoms get worse or don't improve in the next few days, go to the emergency room. Keep your follow-up appointment with your rheumatologist and let us know if you decide to have the orthopedic surgery so we can schedule a pre-op visit. Don't forget to get your flu shot next week.

## 2022-11-30 NOTE — Telephone Encounter (Signed)
Pt called to ask if it was possible to reduce the amount of time between his nebulizer treatments as he is having issues with his asthma. Pt stated this treatment is the only thing that helps. Please Advise.

## 2022-11-30 NOTE — Progress Notes (Signed)
Subjective:     Patient ID: Brandon [name], male    DOB: Sep 21, 1940, 82 y.o.   MRN: [id number]  Chief Complaint  Patient presents with   Asthma    Patient reports trouble breathing since last week, better with nebulizer     HPI  Discussed the use of AI scribe software for clinical note transcription with the patient, who gave verbal consent to proceed.   82 year old male presents to the clinic today for possible asthma flare up. He states that this started to occur last week and then he started to develop shortness of breath. He states it is hard for him to catch his breath sometimes  Patient states that the albuterol nebulizer makes it better  He states that when this started to occur he used his Rescue inhaler of Albuterol but it did not help at all with his breathing   Adviar daily twice a day   Wife states he has been sluggish for the past week or so.        Health Maintenance Due  Topic Date Due   Zoster Vaccines- Shingrix (2 of 2) 06/10/2021   INFLUENZA VACCINE  10/14/2022   COVID-19 Vaccine (7 - 2023-24 season) 11/14/2022    Past Medical History:  Diagnosis Date   Anemia    Asthma    Hx of childhood asthma, disappeared for a while, then resurfaced 6-7 years ago.    Cardiomyopathy    with a negative cardiac catheterization in the past. 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     Past Surgical History:  Procedure Laterality Date   None      Family History  Problem Relation Age of Onset   Multiple myeloma Father    Lupus Sister        died at 55   Diabetes Mellitus II Sister        died from covid-19   Neuropathy Sister    Asthma Daughter    Colon cancer Neg Hx    Esophageal cancer Neg Hx    Stomach cancer Neg Hx    Pancreatic cancer Neg  Hx     Social History   Socioeconomic History   Marital status: Married    Spouse name: [name]   Number of children: 3   Years of education: Not on file   Highest education level: Not on file  Occupational History   Occupation: retired  Tobacco Use   Smoking status: Former   Smokeless tobacco: Never   Tobacco comments:    quit smoling in 02/17/89. started when 18, 1 ppd  Vaping Use   Vaping status: Never Used  Substance and Sexual Activity   Alcohol use: Yes    Comment: occ   Drug use: Never   Sexual activity: Not on file  Other Topics Concern   Not on file  Social History Narrative   Grew up in [place], attended [employer]. First wife died of breast ca in 02-17-1998. 3 children. Remarried- 8 years. 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    Pt signed designated party release granting access to [facility] to his wife [name]. Detailed message may be left on home or cell phone. Ollen Gross  Linward Headland August 13, 2009 11:34 am.    Social Determinants of Health   Financial Resource Strain: Medium Risk (03/19/2021)   Overall Financial Resource Strain (CARDIA)    Difficulty of Paying Living Expenses: Somewhat hard  Food Insecurity: No Food Insecurity (04/19/2022)   Hunger Vital Sign    Worried About Running Out of Food in the Last Year: Never true    Ran Out of Food in the Last Year: Never true  Transportation Needs: No Transportation Needs (04/20/2022)   PRAPARE - Administrator, Civil Service (Medical): No    Lack of Transportation (Non-Medical): No  Physical Activity: Insufficiently Active (07/25/2020)   Exercise Vital Sign    Days of Exercise per Week: 5 days    Minutes of Exercise per Session: 10 min  Stress: No Stress Concern Present (05/28/2021)   [employer] of Occupational Health - Occupational Stress Questionnaire    Feeling of Stress : Not at all  Social Connections: Socially Integrated (05/28/2021)   Social Connection and Isolation Panel [NHANES]     Frequency of Communication with Friends and Family: More than three times a week    Frequency of Social Gatherings with Friends and Family: Three times a week    Attends Religious Services: More than 4 times per year    Active Member of Clubs or Organizations: Yes    Attends Banker Meetings: More than 4 times per year    Marital Status: Married  Catering manager Violence: Not At Risk (04/19/2022)   Humiliation, Afraid, Rape, and Kick questionnaire    Fear of Current or Ex-Partner: No    Emotionally Abused: No    Physically Abused: No    Sexually Abused: No    Outpatient Medications Prior to Visit  Medication Sig Dispense Refill   albuterol (PROVENTIL) (2.5 MG/3ML) 0.083% nebulizer solution TAKE 3 MLS BY NEBULIZER EVERY 6 HOURS AS NEEDED FOR WHEEZING FOR SHORTNESS OF BREATH 150 mL 3   albuterol (VENTOLIN HFA) 108 (90 Base) MCG/ACT inhaler Inhale 1-2 puffs into the lungs every 6 (six) hours as needed for wheezing or shortness of breath. 18 g 5   amLODipine (NORVASC) 10 MG tablet Take 1 tablet by mouth once daily 90 tablet 0   aspirin EC 81 MG tablet Take 81 mg by mouth daily. Swallow whole.     cyanocobalamin (VITAMIN B12) 1000 MCG tablet Take 1 tablet (1,000 mcg total) by mouth daily.     fluticasone-salmeterol (ADVAIR) 500-50 MCG/ACT AEPB Inhale 1 puff into the lungs in the morning and at bedtime. 60 each 6   furosemide (LASIX) 20 MG tablet Take 1 tablet (20 mg total) by mouth daily. 30 tablet 3   gabapentin (NEURONTIN) 100 MG capsule Take 1 capsule (100 mg total) by mouth at bedtime. 90 capsule 1   leflunomide (ARAVA) 20 MG tablet Take 20 mg by mouth daily.     metoprolol succinate (TOPROL-XL) 25 MG 24 hr tablet Take 1.5 tablets (37.5 mg total) by mouth daily. 150 tablet 1   montelukast (SINGULAIR) 10 MG tablet Take 1 tablet (10 mg total) by mouth at bedtime. 90 tablet 1   omeprazole (PRILOSEC) 20 MG capsule Take 1 capsule (20 mg total) by mouth daily. 90 capsule 3    rosuvastatin (CRESTOR) 10 MG tablet Take 1 tablet (10 mg total) by mouth daily. 90 tablet 1   sacubitril-valsartan (ENTRESTO) 97-103 MG Take 1 tablet by mouth 2 (two) times daily. (For medication assistance program) 180 tablet 3  spironolactone (ALDACTONE) 25 MG tablet Take 1 tablet (25 mg total) by mouth daily. 90 tablet 1   tamsulosin (FLOMAX) 0.4 MG CAPS capsule Take 1 capsule (0.4 mg total) by mouth daily. 90 capsule 0   omeprazole (PRILOSEC) 40 MG capsule Take 1 capsule (40 mg total) by mouth 2 (two) times daily for 14 days. 28 capsule 0   No facility-administered medications prior to visit.    Allergies  Allergen Reactions   Ace Inhibitors Cough   Isosorb Dinitrate-Hydralazine Other (See Comments)    REACTION: dizziness/hypotensive    Review of Systems  Constitutional:  Negative for chills.  HENT:  Negative for congestion, sore throat and tinnitus.   Respiratory:  Positive for shortness of breath and wheezing. Negative for cough.   Cardiovascular:  Negative for chest pain and palpitations.  Gastrointestinal:  Negative for abdominal pain, diarrhea and heartburn.  Musculoskeletal:  Negative for neck pain.  Neurological:  Negative for dizziness and headaches.  Psychiatric/Behavioral:  Negative for depression.        Objective:    Physical Exam Constitutional:      Appearance: Normal appearance. He is normal weight.  Cardiovascular:     Rate and Rhythm: Normal rate and regular rhythm.     Pulses: Normal pulses.     Heart sounds: Normal heart sounds.  Pulmonary:     Breath sounds: Wheezing present.  Skin:    General: Skin is warm.  Neurological:     General: No focal deficit present.     Mental Status: He is alert and oriented to person, place, and time. Mental status is at baseline.  Psychiatric:        Mood and Affect: Mood normal.        Behavior: Behavior normal.        Thought Content: Thought content normal.        Judgment: Judgment normal.      BP 124/85  (BP Location: Right Arm, Patient Position: Sitting, Cuff Size: Small)   Pulse 85   Temp 98.2 F (36.8 C) (Oral)   Resp 18   Wt 222 lb (100.7 kg)   SpO2 97%   BMI 30.96 kg/m  Wt Readings from Last 3 Encounters:  11/30/22 222 lb (100.7 kg)  09/28/22 222 lb (100.7 kg)  09/21/22 218 lb 12.8 oz (99.2 kg)       Assessment & Plan:   Problem List Items Addressed This Visit   None   I am having Brandon [name] maintain his leflunomide, albuterol, furosemide, sacubitril-valsartan, aspirin EC, fluticasone-salmeterol, spironolactone, tamsulosin, metoprolol succinate, gabapentin, cyanocobalamin, montelukast, amLODipine, omeprazole, omeprazole, albuterol, and rosuvastatin.  No orders of the defined types were placed in this encounter.

## 2022-11-30 NOTE — Telephone Encounter (Signed)
Patient will be added to schedule this afternoon for evaluation

## 2022-11-30 NOTE — Assessment & Plan Note (Signed)
Hx consistent with acute asthma exacerbation. Will rx with prednisone taper and patient is advised to continue albuterol mdi or nebs as needed. Will obtain baseline CXR to rule out PNA. He is advised to go to the ER if he develops worsening SOB and let us know if no improvement in his symptoms in 3-4 days.

## 2022-11-30 NOTE — Progress Notes (Addendum)
Subjective:     Patient ID: Brandon [name], male    DOB: 1940/07/10, 82 y.o.   MRN: [id number]  Chief Complaint  Patient presents with   Asthma    Patient reports trouble breathing since last week, better with nebulizer     Asthma His past medical history is significant for asthma.    Discussed the use of AI scribe software for clinical note transcription with the patient, who gave verbal consent to proceed.  History of Present Illness   The patient, with a history of asthma, cardiomyopathy, and OSA, presents with worsening breathing difficulty over the past week. He reports that his inhaler initially provided some relief, but as his symptoms progressed he noted less relief from his inhaler.  He then began using a nebulizer, which he has found to be very helpful. He denies fever, cough, and nasal congestion. He also reports some weight fluctuation, but overall stable weight.  In addition to his asthma, the patient has been followed by rheumatology for RA and he reports noticing some increased joint pain recently. Marland Kitchen He has an upcoming appointment with rheumatology. He has been experiencing lethargy which is wife thinks may be due to his asthma symptoms.     He also mentions a potential upcoming orthopedic surgery, for which he has received a surgical package from orthopedics. He has not yet decided if he will proceed with surgery but will book a pre-op medical clearance visit with [place] if he does decide to proceed.      Wt Readings from Last 3 Encounters:  11/30/22 222 lb (100.7 kg)  09/28/22 222 lb (100.7 kg)  09/21/22 218 lb 12.8 oz (99.2 kg)       Health Maintenance Due  Topic Date Due   Zoster Vaccines- Shingrix (2 of 2) 06/10/2021   INFLUENZA VACCINE  10/14/2022   COVID-19 Vaccine (7 - 2023-24 season) 11/14/2022    Past Medical History:  Diagnosis Date   Anemia    Asthma    Hx of childhood asthma, disappeared for a while, then resurfaced 6-7 years ago.     Cardiomyopathy    with a negative cardiac catheterization in the past. 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     Past Surgical History:  Procedure Laterality Date   None      Family History  Problem Relation Age of Onset   Multiple myeloma Father    Lupus Sister        died at 40   Diabetes Mellitus II Sister        died from covid-19   Neuropathy Sister    Asthma Daughter    Colon cancer Neg Hx    Esophageal cancer Neg Hx    Stomach cancer Neg Hx    Pancreatic cancer Neg Hx     Social History   Socioeconomic History   Marital status: Married    Spouse name: [name]   Number of children: 3   Years of education: Not on file   Highest education level: Not on file  Occupational History   Occupation: retired  Tobacco Use   Smoking status: Former   Smokeless tobacco: Never   Tobacco comments:    quit smoling in 1990. started  when 18, 1 ppd  Vaping Use   Vaping status: Never Used  Substance and Sexual Activity   Alcohol use: Yes    Comment: occ   Drug use: Never   Sexual activity: Not on file  Other Topics Concern   Not on file  Social History Narrative   Grew up in [place], attended [employer]. First wife died of breast ca in Feb 02, 1998. 3 children. Remarried- 8 years. 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    Pt signed designated party release granting access to [facility] to his wife Brandon [name]. Detailed message may be left on home or cell phone. Roselle Locus August 13, 2009 11:34 am.    Social Determinants of Health   Financial Resource Strain: Medium Risk (03/19/2021)   Overall Financial Resource Strain (CARDIA)    Difficulty of Paying Living Expenses: Somewhat hard  Food Insecurity: No Food Insecurity (04/19/2022)   Hunger Vital Sign     Worried About Running Out of Food in the Last Year: Never true    Ran Out of Food in the Last Year: Never true  Transportation Needs: No Transportation Needs (04/20/2022)   PRAPARE - Administrator, Civil Service (Medical): No    Lack of Transportation (Non-Medical): No  Physical Activity: Insufficiently Active (07/25/2020)   Exercise Vital Sign    Days of Exercise per Week: 5 days    Minutes of Exercise per Session: 10 min  Stress: No Stress Concern Present (05/28/2021)   [employer] of Occupational Health - Occupational Stress Questionnaire    Feeling of Stress : Not at all  Social Connections: Socially Integrated (05/28/2021)   Social Connection and Isolation Panel [NHANES]    Frequency of Communication with Friends and Family: More than three times a week    Frequency of Social Gatherings with Friends and Family: Three times a week    Attends Religious Services: More than 4 times per year    Active Member of Clubs or Organizations: Yes    Attends Banker Meetings: More than 4 times per year    Marital Status: Married  Catering manager Violence: Not At Risk (04/19/2022)   Humiliation, Afraid, Rape, and Kick questionnaire    Fear of Current or Ex-Partner: No    Emotionally Abused: No    Physically Abused: No    Sexually Abused: No    Outpatient Medications Prior to Visit  Medication Sig Dispense Refill   albuterol (PROVENTIL) (2.5 MG/3ML) 0.083% nebulizer solution TAKE 3 MLS BY NEBULIZER EVERY 6 HOURS AS NEEDED FOR WHEEZING FOR SHORTNESS OF BREATH 150 mL 3   albuterol (VENTOLIN HFA) 108 (90 Base) MCG/ACT inhaler Inhale 1-2 puffs into the lungs every 6 (six) hours as needed for wheezing or shortness of breath. 18 g 5   amLODipine (NORVASC) 10 MG tablet Take 1 tablet by mouth once daily 90 tablet 0   aspirin EC 81 MG tablet Take 81 mg by mouth daily. Swallow whole.     cyanocobalamin (VITAMIN B12) 1000 MCG tablet Take 1 tablet (1,000 mcg total) by mouth  daily.     fluticasone-salmeterol (ADVAIR) 500-50 MCG/ACT AEPB Inhale 1 puff into the lungs in the morning and at bedtime. 60 each 6   furosemide (LASIX) 20 MG tablet Take 1 tablet (20 mg total) by mouth daily. 30 tablet 3   gabapentin (NEURONTIN) 100 MG capsule Take 1 capsule (100 mg total) by mouth at bedtime. 90 capsule 1   leflunomide (ARAVA)  20 MG tablet Take 20 mg by mouth daily.     metoprolol succinate (TOPROL-XL) 25 MG 24 hr tablet Take 1.5 tablets (37.5 mg total) by mouth daily. 150 tablet 1   montelukast (SINGULAIR) 10 MG tablet Take 1 tablet (10 mg total) by mouth at bedtime. 90 tablet 1   omeprazole (PRILOSEC) 20 MG capsule Take 1 capsule (20 mg total) by mouth daily. 90 capsule 3   rosuvastatin (CRESTOR) 10 MG tablet Take 1 tablet (10 mg total) by mouth daily. 90 tablet 1   sacubitril-valsartan (ENTRESTO) 97-103 MG Take 1 tablet by mouth 2 (two) times daily. (For medication assistance program) 180 tablet 3   spironolactone (ALDACTONE) 25 MG tablet Take 1 tablet (25 mg total) by mouth daily. 90 tablet 1   tamsulosin (FLOMAX) 0.4 MG CAPS capsule Take 1 capsule (0.4 mg total) by mouth daily. 90 capsule 0   omeprazole (PRILOSEC) 40 MG capsule Take 1 capsule (40 mg total) by mouth 2 (two) times daily for 14 days. 28 capsule 0   No facility-administered medications prior to visit.    Allergies  Allergen Reactions   Ace Inhibitors Cough   Isosorb Dinitrate-Hydralazine Other (See Comments)    REACTION: dizziness/hypotensive    ROS    See HPI Objective:    Physical Exam Constitutional:      Appearance: Normal appearance.  HENT:     Right Ear: Tympanic membrane and ear canal normal.     Left Ear: Tympanic membrane and ear canal normal.  Cardiovascular:     Rate and Rhythm: Normal rate and regular rhythm.  Pulmonary:     Effort: Pulmonary effort is normal. No respiratory distress.     Breath sounds: No wheezing.  Skin:    General: Skin is warm and dry.  Neurological:      Mental Status: He is alert.      BP 124/85 (BP Location: Right Arm, Patient Position: Sitting, Cuff Size: Small)   Pulse 85   Temp 98.2 F (36.8 C) (Oral)   Resp 18   Wt 222 lb (100.7 kg)   SpO2 97%   BMI 30.96 kg/m  Wt Readings from Last 3 Encounters:  11/30/22 222 lb (100.7 kg)  09/28/22 222 lb (100.7 kg)  09/21/22 218 lb 12.8 oz (99.2 kg)       Assessment & Plan:   Problem List Items Addressed This Visit       Unprioritized   Asthma    Hx consistent with acute asthma exacerbation. Will rx with prednisone taper and patient is advised to continue albuterol mdi or nebs as needed. Will obtain baseline CXR to rule out PNA. He is advised to go to the ER if he develops worsening SOB and let us know if no improvement in his symptoms in 3-4 days.       Relevant Medications   predniSONE (DELTASONE) 10 MG tablet   Other Relevant Orders   DG Chest 2 View (Completed)   Other Visit Diagnoses     SOB (shortness of breath)    -  Primary   Relevant Orders   EKG 12-Lead (Completed)     EKG tracing is personally reviewed.  EKG notes NSR.  No acute changes. RBBB, appears essentially unchanged from last EKG on file 04/21/22.   I am having Gemini E. [name] start on predniSONE. I am also having him maintain his leflunomide, albuterol, furosemide, sacubitril-valsartan, aspirin EC, fluticasone-salmeterol, spironolactone, tamsulosin, metoprolol succinate, gabapentin, cyanocobalamin, montelukast, amLODipine, omeprazole, omeprazole, albuterol, and  rosuvastatin.  Meds ordered this encounter  Medications   predniSONE (DELTASONE) 10 MG tablet    Sig: 4 tabs by mouth once daily for 2 days, then 3 tabs daily x 2 days, then 2 tabs daily x 2 days, then 1 tab daily x 2 days    Dispense:  20 tablet    Refill:  0    Order Specific Question:   Supervising Provider    Answer:   Danise Edge A [[id number]]

## 2022-12-01 ENCOUNTER — Encounter: Payer: Self-pay | Admitting: Family

## 2022-12-01 ENCOUNTER — Ambulatory Visit: Payer: Medicare Other | Attending: Cardiology | Admitting: Cardiology

## 2022-12-01 ENCOUNTER — Encounter: Payer: Self-pay | Admitting: Cardiology

## 2022-12-01 VITALS — BP 112/80 | HR 80 | Ht 71.0 in | Wt 222.2 lb

## 2022-12-01 DIAGNOSIS — I10 Essential (primary) hypertension: Secondary | ICD-10-CM | POA: Diagnosis not present

## 2022-12-01 DIAGNOSIS — I493 Ventricular premature depolarization: Secondary | ICD-10-CM

## 2022-12-01 DIAGNOSIS — I5022 Chronic systolic (congestive) heart failure: Secondary | ICD-10-CM

## 2022-12-01 NOTE — Patient Instructions (Signed)
Medication Instructions:  NO CHANGES  *If you need a refill on your cardiac medications before your next appointment, please call your pharmacy*   Follow-Up: At [facility], you and your health needs are our priority.  As part of our continuing mission to provide you with exceptional heart care, we have created designated Provider Care Teams.  These Care Teams include your primary Cardiologist (physician) and Advanced Practice Providers (APPs -  Physician Assistants and Nurse Practitioners) who all work together to provide you with the care you need, when you need it.  We recommend signing up for the patient portal called "MyChart".  Sign up information is provided on this After Visit Summary.  MyChart is used to connect with patients for Virtual Visits (Telemedicine).  Patients are able to view lab/test results, encounter notes, upcoming appointments, etc.  Non-urgent messages can be sent to your provider as well.   To learn more about what you can do with MyChart, go to [email or web address].    Your next appointment:    Edd Fabian NP in 6 months ** call in December for this appointment

## 2022-12-09 DIAGNOSIS — H02831 Dermatochalasis of right upper eyelid: Secondary | ICD-10-CM | POA: Diagnosis not present

## 2022-12-09 DIAGNOSIS — H524 Presbyopia: Secondary | ICD-10-CM | POA: Diagnosis not present

## 2022-12-09 DIAGNOSIS — H43813 Vitreous degeneration, bilateral: Secondary | ICD-10-CM | POA: Diagnosis not present

## 2022-12-09 DIAGNOSIS — Z961 Presence of intraocular lens: Secondary | ICD-10-CM | POA: Diagnosis not present

## 2022-12-09 DIAGNOSIS — H02834 Dermatochalasis of left upper eyelid: Secondary | ICD-10-CM | POA: Diagnosis not present

## 2022-12-09 DIAGNOSIS — H5203 Hypermetropia, bilateral: Secondary | ICD-10-CM | POA: Diagnosis not present

## 2022-12-09 DIAGNOSIS — H11823 Conjunctivochalasis, bilateral: Secondary | ICD-10-CM | POA: Diagnosis not present

## 2022-12-09 DIAGNOSIS — H18413 Arcus senilis, bilateral: Secondary | ICD-10-CM | POA: Diagnosis not present

## 2022-12-09 DIAGNOSIS — H52203 Unspecified astigmatism, bilateral: Secondary | ICD-10-CM | POA: Diagnosis not present

## 2022-12-20 ENCOUNTER — Ambulatory Visit: Payer: Medicare Other | Admitting: Family

## 2022-12-20 VITALS — BP 117/74 | HR 86 | Temp 98.2°F | Resp 16 | Wt 221.0 lb

## 2022-12-20 DIAGNOSIS — D649 Anemia, unspecified: Secondary | ICD-10-CM

## 2022-12-20 DIAGNOSIS — I5022 Chronic systolic (congestive) heart failure: Secondary | ICD-10-CM

## 2022-12-20 DIAGNOSIS — E538 Deficiency of other specified B group vitamins: Secondary | ICD-10-CM

## 2022-12-20 DIAGNOSIS — F32A Depression, unspecified: Secondary | ICD-10-CM

## 2022-12-20 DIAGNOSIS — Z23 Encounter for immunization: Secondary | ICD-10-CM | POA: Diagnosis not present

## 2022-12-20 DIAGNOSIS — I10 Essential (primary) hypertension: Secondary | ICD-10-CM

## 2022-12-20 DIAGNOSIS — N1831 Chronic kidney disease, stage 3a: Secondary | ICD-10-CM | POA: Diagnosis not present

## 2022-12-20 DIAGNOSIS — J45909 Unspecified asthma, uncomplicated: Secondary | ICD-10-CM | POA: Diagnosis not present

## 2022-12-20 DIAGNOSIS — J3489 Other specified disorders of nose and nasal sinuses: Secondary | ICD-10-CM | POA: Diagnosis not present

## 2022-12-20 DIAGNOSIS — M069 Rheumatoid arthritis, unspecified: Secondary | ICD-10-CM | POA: Diagnosis not present

## 2022-12-20 DIAGNOSIS — Z8673 Personal history of transient ischemic attack (TIA), and cerebral infarction without residual deficits: Secondary | ICD-10-CM

## 2022-12-20 DIAGNOSIS — R195 Other fecal abnormalities: Secondary | ICD-10-CM

## 2022-12-20 DIAGNOSIS — E785 Hyperlipidemia, unspecified: Secondary | ICD-10-CM

## 2022-12-20 DIAGNOSIS — I639 Cerebral infarction, unspecified: Secondary | ICD-10-CM

## 2022-12-20 DIAGNOSIS — G629 Polyneuropathy, unspecified: Secondary | ICD-10-CM | POA: Diagnosis not present

## 2022-12-20 DIAGNOSIS — G4733 Obstructive sleep apnea (adult) (pediatric): Secondary | ICD-10-CM

## 2022-12-20 DIAGNOSIS — R739 Hyperglycemia, unspecified: Secondary | ICD-10-CM

## 2022-12-20 DIAGNOSIS — N401 Enlarged prostate with lower urinary tract symptoms: Secondary | ICD-10-CM

## 2022-12-20 MED ORDER — GABAPENTIN 300 MG PO CAPS: 300.0000 mg | ORAL_CAPSULE | Freq: Every day | ORAL | 1 refills | Status: DC

## 2022-12-20 NOTE — Assessment & Plan Note (Signed)
Uncontrolled. Increase HS Gabapentin from 100mg  to 300mg  at bedtime.

## 2022-12-20 NOTE — Assessment & Plan Note (Signed)
Stable, continues flomax.

## 2022-12-20 NOTE — Assessment & Plan Note (Signed)
Clinically stable, using lasix prn and has not needed recently.  Wt Readings from Last 3 Encounters:  12/20/22 221 lb (100.2 kg)  12/01/22 222 lb 3.2 oz (100.8 kg)  11/30/22 222 lb (100.7 kg)

## 2022-12-20 NOTE — Assessment & Plan Note (Signed)
Will update level, not currently on supplement.

## 2022-12-20 NOTE — Assessment & Plan Note (Signed)
Had H pylori gastritis which was treated following colo/endo this summer with GI, Dr. Chales Abrahams.

## 2022-12-20 NOTE — Assessment & Plan Note (Signed)
On statin, aspirin.

## 2022-12-20 NOTE — Assessment & Plan Note (Addendum)
Stable at this time. Continue current medications. Continue toprol xl, amlodipine.

## 2022-12-20 NOTE — Assessment & Plan Note (Signed)
Lab Results  Component Value Date   HGBA1C 5.1 04/18/2022   A1C normal last check, monitor.

## 2022-12-20 NOTE — Assessment & Plan Note (Signed)
Making at appt with RA doctor. But stable at this time

## 2022-12-20 NOTE — Assessment & Plan Note (Signed)
Does have CPAP. But with his asthma he states he has not used in a few nights

## 2022-12-20 NOTE — Assessment & Plan Note (Addendum)
Saw ENT and was treated with antibiotics and reports resolution. States that it was an abscess that drained and was related to some dental issues.  He plans to follow up with his dentist for ongoing dental care.

## 2022-12-20 NOTE — Assessment & Plan Note (Signed)
Patient states that his depression is doing ok

## 2022-12-20 NOTE — Assessment & Plan Note (Signed)
Update renal function.  

## 2022-12-20 NOTE — Progress Notes (Signed)
Subjective:     Patient ID: Brandon [name], male    DOB: [birth year], 82 y.o.   MRN: [id number]  Chief Complaint  Patient presents with   Asthma    Here for follow up    HPI  Discussed the use of AI scribe software for clinical note transcription with the patient, who gave verbal consent to proceed.  History of Present Illness   The patient, with a history of asthma, presents with persistent symptoms despite completing a prednisone taper and continuing maintenance therapy with Advair, Singulair, and a nebulizer. He reports a brief improvement after the prednisone taper, but has since plateaued and feels he should have improved more. The nebulizer provides some relief, and he uses it first thing in the morning and before bedtime. He also has a history of sleep apnea and is managed with CPAP by a pulmonologist, but has not seen this specialist for his asthma.  In addition to his asthma, the patient has been managing a nasal abscess related to a dental issue. The abscess has since burst and resolved after treatment with antibiotics. He also has a history of stroke, which has delayed his dental care. He has been recommended for hip replacement surgery by an orthopedic doctor and is considering this option.  The patient also has a history of anemia, which is being monitored. He has been taking B12 tablets, which have been effective. He also has a history of acid reflux, which is managed with omeprazole. He has been alternating between 20mg  and 40mg  doses, and is currently on the 20mg  dose. He also takes Flomax for urinary symptoms, which are well controlled.  The patient also has a history of neuropathy, which is managed with gabapentin. He is currently on a low dose of 100mg  at bedtime, and has not noticed a significant improvement in his symptoms. He is considering increasing the dose to 300mg  at bedtime.  The patient also has a history of heart disease, and is managed with toprol XL and  amlodipine. He also takes Lasix as needed for fluid retention, but has not needed to use it recently. He also takes Crestor for cholesterol management.          Health Maintenance Due  Topic Date Due   Zoster Vaccines- Shingrix (2 of 2) 06/10/2021   COVID-19 Vaccine (7 - 2023-24 season) 11/14/2022    Past Medical History:  Diagnosis Date   Anemia    Asthma    Hx of childhood asthma, disappeared for a while, then resurfaced 6-7 years ago.    Cardiomyopathy    with a negative cardiac catheterization in the past. 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     Past Surgical History:  Procedure Laterality Date   None      Family History  Problem Relation Age of Onset   Multiple myeloma Father    Lupus Sister        died at 33   Diabetes Mellitus II Sister        died from covid-19   Neuropathy Sister    Asthma Daughter    Colon cancer Neg Hx    Esophageal cancer Neg Hx    Stomach cancer Neg Hx    Pancreatic  cancer Neg Hx     Social History   Socioeconomic History   Marital status: Married    Spouse name: [name]   Number of children: 3   Years of education: Not on file   Highest education level: Not on file  Occupational History   Occupation: retired  Tobacco Use   Smoking status: Former   Smokeless tobacco: Never   Tobacco comments:    quit smoling in Feb 12, 1989. started when 18, 1 ppd  Vaping Use   Vaping status: Never Used  Substance and Sexual Activity   Alcohol use: Yes    Comment: occ   Drug use: Never   Sexual activity: Not on file  Other Topics Concern   Not on file  Social History Narrative   Grew up in [place], attended [employer]. First wife died of breast ca in 1998/02/12. 3 children. Remarried- 8 years. 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    Pt signed designated party release granting access to [facility] to his wife [name]. Detailed message may be left on home or cell phone. Roselle Locus August 13, 2009 11:34 am.    Social Determinants of Health   Financial Resource Strain: Medium Risk (03/19/2021)   Overall Financial Resource Strain (CARDIA)    Difficulty of Paying Living Expenses: Somewhat hard  Food Insecurity: No Food Insecurity (04/19/2022)   Hunger Vital Sign    Worried About Running Out of Food in the Last Year: Never true    Ran Out of Food in the Last Year: Never true  Transportation Needs: No Transportation Needs (04/20/2022)   PRAPARE - Administrator, Civil Service (Medical): No    Lack of Transportation (Non-Medical): No  Physical Activity: Insufficiently Active (07/25/2020)   Exercise Vital Sign    Days of Exercise per Week: 5 days    Minutes of Exercise per Session: 10 min  Stress: No Stress Concern Present (05/28/2021)   [employer] of Occupational Health - Occupational Stress Questionnaire    Feeling of Stress : Not at all  Social Connections: Socially Integrated (05/28/2021)   Social Connection and Isolation Panel [NHANES]    Frequency of Communication with Friends and Family: More than three times a week    Frequency of Social Gatherings with Friends and Family: Three times a week    Attends Religious Services: More than 4 times per year    Active Member of Clubs or Organizations: Yes    Attends Banker Meetings: More than 4 times per year    Marital Status: Married  Catering manager Violence: Not At Risk (04/19/2022)   Humiliation, Afraid, Rape, and Kick questionnaire    Fear of Current or Ex-Partner: No    Emotionally Abused: No    Physically Abused: No    Sexually Abused: No    Outpatient Medications Prior to Visit  Medication Sig Dispense Refill   albuterol (PROVENTIL) (2.5 MG/3ML) 0.083% nebulizer solution TAKE 3 MLS BY NEBULIZER EVERY 6 HOURS AS NEEDED  FOR WHEEZING FOR SHORTNESS OF BREATH 150 mL 3   albuterol (VENTOLIN HFA) 108 (90 Base) MCG/ACT inhaler Inhale 1-2 puffs into the lungs every 6 (six) hours as needed for wheezing or shortness of breath. 18 g 5   amLODipine (NORVASC) 10 MG tablet Take 1 tablet by mouth once daily 90 tablet 0   aspirin EC 81 MG tablet Take 81 mg by mouth daily. Swallow whole.     cyanocobalamin (VITAMIN B12) 1000  MCG tablet Take 1 tablet (1,000 mcg total) by mouth daily.     fluticasone-salmeterol (ADVAIR) 500-50 MCG/ACT AEPB Inhale 1 puff into the lungs in the morning and at bedtime. 60 each 6   furosemide (LASIX) 20 MG tablet Take 1 tablet (20 mg total) by mouth daily. 30 tablet 3   leflunomide (ARAVA) 20 MG tablet Take 20 mg by mouth daily.     metoprolol succinate (TOPROL-XL) 25 MG 24 hr tablet Take 1.5 tablets (37.5 mg total) by mouth daily. 150 tablet 1   montelukast (SINGULAIR) 10 MG tablet Take 1 tablet (10 mg total) by mouth at bedtime. 90 tablet 1   omeprazole (PRILOSEC) 20 MG capsule Take 1 capsule (20 mg total) by mouth daily. 90 capsule 3   rosuvastatin (CRESTOR) 10 MG tablet Take 1 tablet (10 mg total) by mouth daily. 90 tablet 1   sacubitril-valsartan (ENTRESTO) 97-103 MG Take 1 tablet by mouth 2 (two) times daily. (For medication assistance program) 180 tablet 3   spironolactone (ALDACTONE) 25 MG tablet Take 1 tablet (25 mg total) by mouth daily. 90 tablet 1   tamsulosin (FLOMAX) 0.4 MG CAPS capsule Take 1 capsule (0.4 mg total) by mouth daily. 90 capsule 0   gabapentin (NEURONTIN) 100 MG capsule Take 1 capsule (100 mg total) by mouth at bedtime. 90 capsule 1   omeprazole (PRILOSEC) 40 MG capsule Take 1 capsule (40 mg total) by mouth 2 (two) times daily for 14 days. 28 capsule 0   predniSONE (DELTASONE) 10 MG tablet 4 tabs by mouth once daily for 2 days, then 3 tabs daily x 2 days, then 2 tabs daily x 2 days, then 1 tab daily x 2 days 20 tablet 0   No facility-administered medications prior to visit.     Allergies  Allergen Reactions   Ace Inhibitors Cough   Isosorb Dinitrate-Hydralazine Other (See Comments)    REACTION: dizziness/hypotensive    ROS See HPI    Objective:    Physical Exam Constitutional:      General: He is not in acute distress.    Appearance: He is well-developed.  HENT:     Head: Normocephalic and atraumatic.  Cardiovascular:     Rate and Rhythm: Normal rate and regular rhythm.     Heart sounds: No murmur heard.    Comments: Split S1 Pulmonary:     Effort: Pulmonary effort is normal. No respiratory distress.     Breath sounds: Normal breath sounds. No wheezing or rales.  Musculoskeletal:     Right lower leg: No edema.     Left lower leg: No edema.  Skin:    General: Skin is warm and dry.  Neurological:     Mental Status: He is alert and oriented to person, place, and time.  Psychiatric:        Behavior: Behavior normal.        Thought Content: Thought content normal.      BP 117/74 (BP Location: Right Arm, Patient Position: Sitting, Cuff Size: Large)   Pulse 86   Temp 98.2 F (36.8 C) (Oral)   Resp 16   Wt 221 lb (100.2 kg)   SpO2 98%   BMI 30.82 kg/m  Wt Readings from Last 3 Encounters:  12/20/22 221 lb (100.2 kg)  12/01/22 222 lb 3.2 oz (100.8 kg)  11/30/22 222 lb (100.7 kg)       Assessment & Plan:   Problem List Items Addressed This Visit  Unprioritized   Stage 3a chronic kidney disease (HCC)    Update renal function.       Relevant Orders   Basic Metabolic Panel (BMET)   Rheumatoid arthritis (HCC)    Making at appt with RA doctor. But stable at this time       Obstructive sleep apnea    Does have CPAP. But with his asthma he states he has not used in a few nights       Normocytic anemia    Lab Results  Component Value Date   WBC 7.1 09/21/2022   HGB 11.8 (L) 09/21/2022   HCT 35.9 (L) 09/21/2022   MCV 97.4 09/21/2022   PLT 179.0 09/21/2022   Update cbc.         Relevant Orders   CBC w/Diff    Neuropathy    Uncontrolled. Increase HS Gabapentin from 100mg  to 300mg  at bedtime.      Nasal mass    Saw ENT and was treated with antibiotics and reports resolution. States that it was an abscess that drained and was related to some dental issues.  He plans to follow up with his dentist for ongoing dental care.       Hyperlipidemia    Lab Results  Component Value Date   CHOL 129 09/21/2022   HDL 53.30 09/21/2022   LDLCALC 60 09/21/2022   TRIG 78.0 09/21/2022   CHOLHDL 2 09/21/2022   LDL at goal on crestor- continue same.       Hyperglycemia    Lab Results  Component Value Date   HGBA1C 5.1 04/18/2022   A1C normal last check, monitor.       Heme positive stool    Had H pylori gastritis which was treated following colo/endo this summer with GI, Dr. Chales Abrahams.       Folic acid deficiency    Will update level, not currently on supplement.       Relevant Orders   Folate   Essential hypertension - Primary    Stable at this time. Continue current medications. Continue toprol xl, amlodipine.       Depression    Patient states that his depression is doing ok       CVA (cerebral vascular accident) (HCC)    On statin, aspirin.       CHRONIC SYSTOLIC HEART FAILURE    Clinically stable, using lasix prn and has not needed recently.  Wt Readings from Last 3 Encounters:  12/20/22 221 lb (100.2 kg)  12/01/22 222 lb 3.2 oz (100.8 kg)  11/30/22 222 lb (100.7 kg)         BPH (benign prostatic hyperplasia)    Stable, continues flomax.        B12 deficiency    Takes daily b12 oral. Last b12 level was normal. Continue same.       Asthma   Relevant Orders   Ambulatory referral to Pulmonology   Other Visit Diagnoses     Needs flu shot       Relevant Orders   Flu Vaccine Trivalent High Dose (Fluad) (Completed)       I have discontinued Cardale E. Morello's gabapentin and predniSONE. I am also having him start on gabapentin. Additionally, I am having him maintain his  leflunomide, albuterol, furosemide, sacubitril-valsartan, aspirin EC, fluticasone-salmeterol, spironolactone, tamsulosin, metoprolol succinate, cyanocobalamin, montelukast, amLODipine, omeprazole, albuterol, and rosuvastatin.  Meds ordered this encounter  Medications   gabapentin (NEURONTIN) 300 MG capsule    Sig: Take 1  capsule (300 mg total) by mouth at bedtime.    Dispense:  90 capsule    Refill:  1    Order Specific Question:   Supervising Provider    Answer:   Danise Edge A [[id number]]

## 2022-12-20 NOTE — Progress Notes (Signed)
Subjective:     Patient ID: Brandon [name], male    DOB: [birth year], 82 y.o.   MRN: [id number]  Chief Complaint  Patient presents with   Asthma    Here for follow up    Asthma He complains of cough and wheezing. Pertinent negatives include no chest pain, ear pain, fever, headaches or sore throat. His past medical history is significant for asthma.    Discussed the use of AI scribe software for clinical note transcription with the patient, who gave verbal consent to proceed.    Patient is an 82 year old male that presents to the clinic today for issues noted with his asthma.   Patient states that asthma bothers him first thing in the morning.   Uses meds at night before bed and right after he wakes up.   He states that this is the time of year that his asthma starts acting up really bad.   Advair high dose is twice a day and he states he feels like it is still working well.   Albuterol is being used more freq now in the past. He states he is using the inhaler and neb treatments more than normal.   He states that in September when he was given the prednisone taper pak from [facility] that really did help with his breathing. But he states that after he finished it he feels like he is going back to having issues breathing.      Health Maintenance Due  Topic Date Due   Zoster Vaccines- Shingrix (2 of 2) 06/10/2021   INFLUENZA VACCINE  10/14/2022   COVID-19 Vaccine (7 - 2023-24 season) 11/14/2022    Past Medical History:  Diagnosis Date   Anemia    Asthma    Hx of childhood asthma, disappeared for a while, then resurfaced 6-7 years ago.    Cardiomyopathy    with a negative cardiac catheterization in the past. 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     Past  Surgical History:  Procedure Laterality Date   None      Family History  Problem Relation Age of Onset   Multiple myeloma Father    Lupus Sister        died at 39   Diabetes Mellitus II Sister        died from covid-19   Neuropathy Sister    Asthma Daughter    Colon cancer Neg Hx    Esophageal cancer Neg Hx    Stomach cancer Neg Hx    Pancreatic cancer Neg Hx     Social History   Socioeconomic History   Marital status: Married    Spouse name: [name]   Number of children: 3   Years of education: Not on file   Highest education level: Not on file  Occupational History   Occupation: retired  Tobacco Use   Smoking status: Former   Smokeless tobacco: Never   Tobacco comments:    quit smoling in 1990. started when 18, 1 ppd  Vaping Use   Vaping status: Never Used  Substance and Sexual Activity   Alcohol use: Yes    Comment: occ   Drug use: Never   Sexual activity: Not on file  Other Topics Concern   Not on file  Social History Narrative   Grew up in [place], attended [employer]. First wife died of breast ca in 02-17-98. 3 children. Remarried- 8 years. 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    Pt signed designated party release granting access to [facility] to his wife [name]. Detailed message may be left on home or cell phone. Roselle Locus August 13, 2009 11:34 am.    Social Determinants of Health   Financial Resource Strain: Medium Risk (03/19/2021)   Overall Financial Resource Strain (CARDIA)    Difficulty of Paying Living Expenses: Somewhat hard  Food Insecurity: No Food Insecurity (04/19/2022)   Hunger Vital Sign    Worried About Running Out of Food in the Last Year: Never true    Ran Out of Food in the Last Year: Never true  Transportation Needs: No Transportation Needs (04/20/2022)   PRAPARE - Administrator, Civil Service (Medical): No    Lack of Transportation (Non-Medical): No  Physical Activity: Insufficiently Active (07/25/2020)    Exercise Vital Sign    Days of Exercise per Week: 5 days    Minutes of Exercise per Session: 10 min  Stress: No Stress Concern Present (05/28/2021)   [employer] of Occupational Health - Occupational Stress Questionnaire    Feeling of Stress : Not at all  Social Connections: Socially Integrated (05/28/2021)   Social Connection and Isolation Panel [NHANES]    Frequency of Communication with Friends and Family: More than three times a week    Frequency of Social Gatherings with Friends and Family: Three times a week    Attends Religious Services: More than 4 times per year    Active Member of Clubs or Organizations: Yes    Attends Banker Meetings: More than 4 times per year    Marital Status: Married  Catering manager Violence: Not At Risk (04/19/2022)   Humiliation, Afraid, Rape, and Kick questionnaire    Fear of Current or Ex-Partner: No    Emotionally Abused: No    Physically Abused: No    Sexually Abused: No    Outpatient Medications Prior to Visit  Medication Sig Dispense Refill   albuterol (PROVENTIL) (2.5 MG/3ML) 0.083% nebulizer solution TAKE 3 MLS BY NEBULIZER EVERY 6 HOURS AS NEEDED FOR WHEEZING FOR SHORTNESS OF BREATH 150 mL 3   albuterol (VENTOLIN HFA) 108 (90 Base) MCG/ACT inhaler Inhale 1-2 puffs into the lungs every 6 (six) hours as needed for wheezing or shortness of breath. 18 g 5   amLODipine (NORVASC) 10 MG tablet Take 1 tablet by mouth once daily 90 tablet 0   aspirin EC 81 MG tablet Take 81 mg by mouth daily. Swallow whole.     cyanocobalamin (VITAMIN B12) 1000 MCG tablet Take 1 tablet (1,000 mcg total) by mouth daily.     fluticasone-salmeterol (ADVAIR) 500-50 MCG/ACT AEPB Inhale 1 puff into the lungs in the morning and at bedtime. 60 each 6   furosemide (LASIX) 20 MG tablet Take 1 tablet (20 mg total) by mouth daily. 30 tablet 3   gabapentin (NEURONTIN) 100 MG capsule Take 1 capsule (100 mg total) by mouth at bedtime. 90 capsule 1   leflunomide  (ARAVA) 20 MG tablet Take 20 mg by mouth daily.     metoprolol succinate (TOPROL-XL) 25 MG 24 hr tablet Take 1.5 tablets (37.5 mg total) by mouth daily. 150 tablet 1   montelukast (SINGULAIR) 10 MG tablet Take  1 tablet (10 mg total) by mouth at bedtime. 90 tablet 1   omeprazole (PRILOSEC) 20 MG capsule Take 1 capsule (20 mg total) by mouth daily. 90 capsule 3   rosuvastatin (CRESTOR) 10 MG tablet Take 1 tablet (10 mg total) by mouth daily. 90 tablet 1   sacubitril-valsartan (ENTRESTO) 97-103 MG Take 1 tablet by mouth 2 (two) times daily. (For medication assistance program) 180 tablet 3   spironolactone (ALDACTONE) 25 MG tablet Take 1 tablet (25 mg total) by mouth daily. 90 tablet 1   tamsulosin (FLOMAX) 0.4 MG CAPS capsule Take 1 capsule (0.4 mg total) by mouth daily. 90 capsule 0   omeprazole (PRILOSEC) 40 MG capsule Take 1 capsule (40 mg total) by mouth 2 (two) times daily for 14 days. 28 capsule 0   predniSONE (DELTASONE) 10 MG tablet 4 tabs by mouth once daily for 2 days, then 3 tabs daily x 2 days, then 2 tabs daily x 2 days, then 1 tab daily x 2 days 20 tablet 0   No facility-administered medications prior to visit.    Allergies  Allergen Reactions   Ace Inhibitors Cough   Isosorb Dinitrate-Hydralazine Other (See Comments)    REACTION: dizziness/hypotensive    Review of Systems  Constitutional:  Negative for chills and fever.  HENT:  Negative for ear pain, hearing loss, sinus pain and sore throat.   Eyes:  Negative for pain.  Respiratory:  Positive for cough and wheezing.   Cardiovascular:  Negative for chest pain and palpitations.  Musculoskeletal:  Negative for joint pain and neck pain.  Neurological:  Negative for dizziness and headaches.  Psychiatric/Behavioral:  Negative for depression.        Objective:    Physical Exam Constitutional:      Appearance: Normal appearance. He is normal weight.  HENT:     Head: Normocephalic.     Nose: Nose normal.  Cardiovascular:      Rate and Rhythm: Normal rate. Rhythm irregular.     Pulses: Normal pulses.     Heart sounds: Normal heart sounds.     Comments: Split S1/S2 heart sounds  Pulmonary:     Effort: Pulmonary effort is normal.     Breath sounds: Normal breath sounds.  Musculoskeletal:        General: Normal range of motion.     Cervical back: Normal range of motion.  Skin:    General: Skin is warm.     Capillary Refill: Capillary refill takes less than 2 seconds.  Neurological:     General: No focal deficit present.     Mental Status: He is alert and oriented to person, place, and time. Mental status is at baseline.  Psychiatric:        Mood and Affect: Mood normal.        Behavior: Behavior normal.        Thought Content: Thought content normal.        Judgment: Judgment normal.      BP 117/74 (BP Location: Right Arm, Patient Position: Sitting, Cuff Size: Large)   Pulse 86   Temp 98.2 F (36.8 C) (Oral)   Resp 16   Wt 221 lb (100.2 kg)   SpO2 98%   BMI 30.82 kg/m  Wt Readings from Last 3 Encounters:  12/20/22 221 lb (100.2 kg)  12/01/22 222 lb 3.2 oz (100.8 kg)  11/30/22 222 lb (100.7 kg)       Assessment & Plan:   Problem List Items Addressed  This Visit   None   I have discontinued Claudie E. Whitehurst's predniSONE. I am also having him maintain his leflunomide, albuterol, furosemide, sacubitril-valsartan, aspirin EC, fluticasone-salmeterol, spironolactone, tamsulosin, metoprolol succinate, gabapentin, cyanocobalamin, montelukast, amLODipine, omeprazole, omeprazole, albuterol, and rosuvastatin.  No orders of the defined types were placed in this encounter.

## 2022-12-20 NOTE — Assessment & Plan Note (Addendum)
Takes daily b12 oral. Last b12 level was normal. Continue same.

## 2022-12-20 NOTE — Assessment & Plan Note (Signed)
Lab Results  Component Value Date   WBC 7.1 09/21/2022   HGB 11.8 (L) 09/21/2022   HCT 35.9 (L) 09/21/2022   MCV 97.4 09/21/2022   PLT 179.0 09/21/2022   Update cbc.

## 2022-12-20 NOTE — Patient Instructions (Signed)
VISIT SUMMARY:  During your visit, we discussed your ongoing asthma symptoms, despite your current treatment plan. We also talked about your acid reflux, peripheral neuropathy, anemia, and hip osteoarthritis. We reviewed your general health maintenance, including your medications and vaccinations.  YOUR PLAN:  -ASTHMA: Your asthma symptoms are persisting despite your current treatment. Asthma is a condition that makes it hard to breathe due to inflammation in the airways. We will refer you to Dr. Vassie Loll for further management. Continue your current medications and nebulizer use. If your symptoms worsen, contact our office for possible oral prednisone.  -ACID REFLUX: Your acid reflux, a condition where stomach acid flows back into the esophagus causing heartburn, is stable on your current dose of Omeprazole. Continue taking Omeprazole 20mg  daily.  -PERIPHERAL NEUROPATHY: Peripheral neuropathy, a condition that causes weakness, numbness, and pain from nerve damage, usually in the hands and feet. We're not sure if your current dose of Gabapentin is helping. We will increase your Gabapentin to 300mg  at bedtime.  -ANEMIA: Anemia, a condition where your body lacks enough healthy red blood cells to carry adequate oxygen to your body's tissues, may be resolving after treatment for H. pylori and healing of gastric mucosa. We will check your complete blood count today.  -HIP OSTEOARTHRITIS: You're considering hip replacement surgery for your hip osteoarthritis, a condition that causes pain and stiffness in the hip joint. Continue discussions with your orthopedic surgeon and primary care provider regarding the surgical risks and benefits.  INSTRUCTIONS:  For your general health maintenance, we administered the influenza vaccine today. We encourage you to get the COVID-19 booster shot, which you've already received. Continue taking your B12 tablets, Furosemide as needed, Crestor, Toprol XL, Amlodipine, and  Flomax as currently prescribed. Follow up with your GI specialist for a post-treatment evaluation of H. pylori. Also, follow up with your ENT specialist for your resolved nasal abscess. Consider dental work when medically appropriate.

## 2022-12-20 NOTE — Assessment & Plan Note (Signed)
Lab Results  Component Value Date   CHOL 129 09/21/2022   HDL 53.30 09/21/2022   LDLCALC 60 09/21/2022   TRIG 78.0 09/21/2022   CHOLHDL 2 09/21/2022   LDL at goal on crestor- continue same.

## 2022-12-21 ENCOUNTER — Other Ambulatory Visit: Payer: Medicare Other

## 2022-12-21 NOTE — Addendum Note (Signed)
Addended by: Mervin Kung A on: 12/21/2022 07:31 AM   Modules accepted: Orders

## 2022-12-24 ENCOUNTER — Other Ambulatory Visit: Payer: Self-pay | Admitting: Family

## 2022-12-27 ENCOUNTER — Other Ambulatory Visit: Payer: Self-pay

## 2022-12-27 ENCOUNTER — Telehealth: Payer: Self-pay | Admitting: Family

## 2022-12-27 ENCOUNTER — Other Ambulatory Visit (INDEPENDENT_AMBULATORY_CARE_PROVIDER_SITE_OTHER): Payer: Medicare Other

## 2022-12-27 DIAGNOSIS — D649 Anemia, unspecified: Secondary | ICD-10-CM

## 2022-12-27 DIAGNOSIS — E538 Deficiency of other specified B group vitamins: Secondary | ICD-10-CM

## 2022-12-27 DIAGNOSIS — N1831 Chronic kidney disease, stage 3a: Secondary | ICD-10-CM | POA: Diagnosis not present

## 2022-12-27 LAB — BASIC METABOLIC PANEL
BUN: 12 mg/dL (ref 6–23)
CO2: 26 meq/L (ref 19–32)
Calcium: 9.2 mg/dL (ref 8.4–10.5)
Chloride: 106 meq/L (ref 96–112)
Creatinine, Ser: 1.46 mg/dL (ref 0.40–1.50)
GFR: 44.67 mL/min — ABNORMAL LOW (ref >60.00)
Glucose, Bld: 88 mg/dL (ref 70–99)
Potassium: 4 meq/L (ref 3.5–5.1)
Sodium: 140 meq/L (ref 135–145)

## 2022-12-27 LAB — FOLATE: Folate: 5.2 ng/mL — ABNORMAL LOW (ref >5.9)

## 2022-12-27 LAB — CBC WITH DIFFERENTIAL/PLATELET
Basophils Absolute: 0.1 10*3/uL (ref 0.0–0.1)
Basophils Relative: 0.9 % (ref 0.0–3.0)
Eosinophils Absolute: 0.3 10*3/uL (ref 0.0–0.7)
Eosinophils Relative: 4.8 % (ref 0.0–5.0)
HCT: 34.4 % — ABNORMAL LOW (ref 39.0–52.0)
Hemoglobin: 11 g/dL — ABNORMAL LOW (ref 13.0–17.0)
Lymphocytes Relative: 25 % (ref 12.0–46.0)
Lymphs Abs: 1.5 10*3/uL (ref 0.7–4.0)
MCHC: 32 g/dL (ref 30.0–36.0)
MCV: 97 fL (ref 78.0–100.0)
Monocytes Absolute: 0.6 10*3/uL (ref 0.1–1.0)
Monocytes Relative: 10.5 % (ref 3.0–12.0)
Neutro Abs: 3.6 10*3/uL (ref 1.4–7.7)
Neutrophils Relative %: 58.8 % (ref 43.0–77.0)
Platelets: 198 10*3/uL (ref 150.0–400.0)
RBC: 3.55 Mil/uL — ABNORMAL LOW (ref 4.22–5.81)
RDW: 14.8 % (ref 11.5–15.5)
WBC: 6.1 10*3/uL (ref 4.0–10.5)

## 2022-12-27 MED ORDER — FUROSEMIDE 20 MG PO TABS: 20.0000 mg | ORAL_TABLET | Freq: Every day | ORAL | 3 refills | Status: DC

## 2022-12-27 NOTE — Progress Notes (Signed)
Pt here for redraw. No charge today.

## 2022-12-27 NOTE — Telephone Encounter (Signed)
Pt called to advise that he has swelling in his legs/feet and he needs a new prescription to be sent in for the Lasix to [facility] on [address].

## 2022-12-28 ENCOUNTER — Telehealth: Payer: Self-pay | Admitting: Family

## 2022-12-28 DIAGNOSIS — E538 Deficiency of other specified B group vitamins: Secondary | ICD-10-CM

## 2022-12-28 DIAGNOSIS — N1832 Chronic kidney disease, stage 3b: Secondary | ICD-10-CM | POA: Insufficient documentation

## 2022-12-28 MED ORDER — FOLIC ACID 1 MG PO TABS: 1.0000 mg | ORAL_TABLET | Freq: Every day | ORAL | Status: DC

## 2022-12-28 NOTE — Telephone Encounter (Signed)
Patient notified of results and referral

## 2022-12-28 NOTE — Telephone Encounter (Signed)
Please advise pt that his folate level is low.  I would recommend that he add folic acid 1mg  PO daily available OTC.   Kidney function remains reduced but stable since last time it was checked. I would like to refer him to the kidney specialist for this.

## 2022-12-31 ENCOUNTER — Telehealth: Payer: Self-pay | Admitting: Cardiology

## 2022-12-31 NOTE — Telephone Encounter (Signed)
Spoke with pt, he reports swelling of feet and ankles for the last week. He denies SOB, orthopnea or weight gain. He is watching his salt and he was encouraged to wear his compression hose. He was given the okay to take 40 mg of furosemide for the next 3 days. Patient voiced understanding to call after 3 days if no change in swelling.

## 2022-12-31 NOTE — Telephone Encounter (Signed)
Pt c/o swelling/edema: STAT if pt has developed SOB within 24 hours  If swelling, where is the swelling located?   Both legs and feet  How much weight have you gained and in what time span?   No  Have you gained 2 pounds in a day or 5 pounds in a week?   No  Do you have a log of your daily weights (if so, list)?   No  Are you currently taking a fluid pill? Yes  Are you currently SOB?   No  Have you traveled recently in a car or plane for an extended period of time?   No  Patient stated he has been having swelling in both legs and feet and wants to know if he can double up on his furosemide (LASIX) 20 MG tablet.

## 2023-01-07 ENCOUNTER — Other Ambulatory Visit: Payer: Self-pay | Admitting: Family

## 2023-01-07 NOTE — Telephone Encounter (Signed)
Patient stats that there hasn't been any significant change in the swelling, since the change of medication. Calling to see what else should be done. Please advise

## 2023-01-07 NOTE — Telephone Encounter (Signed)
Pt reports still having swelling in both legs. No SOB, Othopnea, syncope or dizziness reported.  Appt made with APP. ER instructions for new/worsening symptoms provided to patient.

## 2023-01-09 NOTE — Progress Notes (Unsigned)
Cardiology Office Note:  .   Date:  01/10/2023  ID:  Brandon [name], DOB [birth year], MRN [id number] PCP: Brandon Craze, NP  [facility] Providers Cardiologist: [name], MD   History of Present Illness: .   Brandon [name] is a 82 y.o. male with history of cardiomyopathy, most recent EF 45 to 50%, CVA, (right thalamic lesion), no evidence of atrial fibrillation on cardiac monitor, hypertension, OSA on CPAP, chronic kidney disease stage III (creatinine 1.45).  Last seen by Dr. Antoine Poche on 12/01/2022.  He comes today with worsening lower extremity edema over the last 2 weeks.  He continues to have some mild dyspnea on exertion.  He denies any dietary indiscretion although he did states he had some [place] food but does not remember how long ago it was.  He has been medically compliant.  He did increase his Lasix to 40 mg daily for 3 days and did not have any appreciable diuresis associated with this.  Denies chest pain, or palpitations.  He does have PND chronically.  Only mild weight gain.  ROS: As above otherwise negative.  Studies Reviewed: Marland Kitchen   EKG Interpretation Date/Time:  Monday January 10 2023 15:09:59 EDT Ventricular Rate:  85 PR Interval:    QRS Duration:  152 QT Interval:  430 QTC Calculation: 511 R Axis:   -71  Text Interpretation: Atrial fibrillation with premature ventricular or aberrantly conducted complexes Left axis deviation Right bundle branch block When compared with ECG of 18-Apr-2022 14:34, Atrial fibrillation has replaced Sinus rhythm QT has lengthened Confirmed by Joni Reining [phone]) on 01/10/2023 3:26:58 PM  Echocardiogram 04/18/2022 1. Left ventricular ejection fraction, by estimation, is 45 to 50%. The  left ventricle has mildly decreased function. The left ventricle  demonstrates global hypokinesis. Left ventricular diastolic parameters are  indeterminate.   2. Right ventricular systolic function is normal. The right ventricular  size  is normal.   3. Left atrial size was moderately dilated.   4. No evidence of mitral valve regurgitation.   5. The aortic valve is grossly normal. Aortic valve regurgitation is not  visualized.   6. The inferior vena cava is normal in size with greater than 50%  respiratory variability, suggesting right atrial pressure of 3 mmHg.    EKG Interpretation Date/Time:  Monday January 10 2023 15:09:59 EDT Ventricular Rate:  85 PR Interval:    QRS Duration:  152 QT Interval:  430 QTC Calculation: 511 R Axis:   -71  Text Interpretation: Atrial fibrillation with premature ventricular or aberrantly conducted complexes Left axis deviation Right bundle branch block When compared with ECG of 18-Apr-2022 14:34, Atrial fibrillation has replaced Sinus rhythm QT has lengthened Confirmed by Joni Reining [phone]) on 01/10/2023 3:26:58 PM    Physical Exam:   VS:  BP 108/66   Pulse 77   Ht 5\' 11"  (1.803 m)   Wt 223 lb 12.8 oz (101.5 kg)   BMI 31.21 kg/m    Wt Readings from Last 3 Encounters:  01/10/23 223 lb 12.8 oz (101.5 kg)  12/20/22 221 lb (100.2 kg)  12/01/22 222 lb 3.2 oz (100.8 kg)    GEN: Well nourished, well developed in no acute distress NECK: No JVD; No carotid bruits CARDIAC: IRRR, no murmurs, rubs, gallops RESPIRATORY:  Clear to auscultation with exception of right base crackles. ABDOMEN: Soft, non-tender, non-distended EXTREMITIES: Very tight bilateral edema up to the thighs.  No deformity   ASSESSMENT AND PLAN: .    Acute on chronic  systolic heart failure: Last seen by Dr. Antoine Poche approximately 1 month ago and assessment showed mild lower extremity edema.  He now has bilateral edema up to the thighs.  Very little weight gain.   2. New Onset Atrial fib: Found to be in atrial fibrillation today.  Likely etiology for worsening CHF.  Will start him on Eliquis 5 mg twice daily and have him admitted as a direct admit for IV diuresis, possible DCCV, repeat echocardiogram, would  continue Entresto, and spironolactone.  Adjust vent based upon kidney function.  I have discussed this with Dr. Royann Shivers, DOD in the office today, who is agreement with my assessment and plan, and need for admission for treatment.    3.   Hypertension: Blood pressure is low normal for reduced EF.  He continues on Entresto 97/23 mg, spironolactone 25 mg daily, and amlodipine 10 mg daily.  3.  Hypercholesterolemia: Continue on statin therapy.   Signed, Bettey Mare. Liborio Nixon, ANP, AACC

## 2023-01-10 ENCOUNTER — Inpatient Hospital Stay (HOSPITAL_COMMUNITY)
Admission: AD | Admit: 2023-01-10 | Discharge: 2023-01-15 | DRG: 286 | Disposition: A | Discharge status: Home / Self Care | Payer: Medicare Other | Source: Ambulatory Visit | Attending: Cardiology | Admitting: Cardiology

## 2023-01-10 ENCOUNTER — Encounter: Payer: Self-pay | Admitting: Adult Health

## 2023-01-10 ENCOUNTER — Other Ambulatory Visit: Payer: Self-pay | Admitting: Adult Health

## 2023-01-10 ENCOUNTER — Ambulatory Visit: Payer: Medicare Other | Attending: Adult Health | Admitting: Adult Health

## 2023-01-10 ENCOUNTER — Encounter (HOSPITAL_COMMUNITY): Payer: Self-pay

## 2023-01-10 VITALS — BP 108/66 | HR 77 | Ht 71.0 in | Wt 223.8 lb

## 2023-01-10 DIAGNOSIS — I428 Other cardiomyopathies: Secondary | ICD-10-CM | POA: Diagnosis not present

## 2023-01-10 DIAGNOSIS — I5082 Biventricular heart failure: Secondary | ICD-10-CM | POA: Diagnosis present

## 2023-01-10 DIAGNOSIS — I4891 Unspecified atrial fibrillation: Secondary | ICD-10-CM | POA: Insufficient documentation

## 2023-01-10 DIAGNOSIS — G4733 Obstructive sleep apnea (adult) (pediatric): Secondary | ICD-10-CM | POA: Diagnosis present

## 2023-01-10 DIAGNOSIS — R339 Retention of urine, unspecified: Secondary | ICD-10-CM | POA: Diagnosis not present

## 2023-01-10 DIAGNOSIS — I5023 Acute on chronic systolic (congestive) heart failure: Secondary | ICD-10-CM | POA: Diagnosis not present

## 2023-01-10 DIAGNOSIS — I272 Pulmonary hypertension, unspecified: Secondary | ICD-10-CM | POA: Diagnosis not present

## 2023-01-10 DIAGNOSIS — I4819 Other persistent atrial fibrillation: Secondary | ICD-10-CM | POA: Insufficient documentation

## 2023-01-10 DIAGNOSIS — I2510 Atherosclerotic heart disease of native coronary artery without angina pectoris: Secondary | ICD-10-CM | POA: Diagnosis not present

## 2023-01-10 DIAGNOSIS — I130 Hypertensive heart and chronic kidney disease with heart failure and stage 1 through stage 4 chronic kidney disease, or unspecified chronic kidney disease: Secondary | ICD-10-CM | POA: Diagnosis not present

## 2023-01-10 DIAGNOSIS — N179 Acute kidney failure, unspecified: Secondary | ICD-10-CM | POA: Insufficient documentation

## 2023-01-10 DIAGNOSIS — N1832 Chronic kidney disease, stage 3b: Secondary | ICD-10-CM | POA: Diagnosis not present

## 2023-01-10 DIAGNOSIS — I10 Essential (primary) hypertension: Secondary | ICD-10-CM | POA: Diagnosis present

## 2023-01-10 DIAGNOSIS — I340 Nonrheumatic mitral (valve) insufficiency: Secondary | ICD-10-CM | POA: Diagnosis not present

## 2023-01-10 DIAGNOSIS — I517 Cardiomegaly: Secondary | ICD-10-CM | POA: Diagnosis not present

## 2023-01-10 DIAGNOSIS — I472 Ventricular tachycardia, unspecified: Secondary | ICD-10-CM | POA: Diagnosis not present

## 2023-01-10 DIAGNOSIS — N189 Chronic kidney disease, unspecified: Secondary | ICD-10-CM | POA: Insufficient documentation

## 2023-01-10 DIAGNOSIS — Z79899 Other long term (current) drug therapy: Secondary | ICD-10-CM

## 2023-01-10 DIAGNOSIS — I959 Hypotension, unspecified: Secondary | ICD-10-CM | POA: Diagnosis not present

## 2023-01-10 DIAGNOSIS — I513 Intracardiac thrombosis, not elsewhere classified: Secondary | ICD-10-CM | POA: Insufficient documentation

## 2023-01-10 DIAGNOSIS — J90 Pleural effusion, not elsewhere classified: Secondary | ICD-10-CM | POA: Diagnosis not present

## 2023-01-10 DIAGNOSIS — G8929 Other chronic pain: Secondary | ICD-10-CM | POA: Diagnosis present

## 2023-01-10 DIAGNOSIS — J9811 Atelectasis: Secondary | ICD-10-CM | POA: Diagnosis not present

## 2023-01-10 DIAGNOSIS — E7800 Pure hypercholesterolemia, unspecified: Secondary | ICD-10-CM | POA: Diagnosis not present

## 2023-01-10 DIAGNOSIS — Z8673 Personal history of transient ischemic attack (TIA), and cerebral infarction without residual deficits: Secondary | ICD-10-CM

## 2023-01-10 DIAGNOSIS — I5021 Acute systolic (congestive) heart failure: Secondary | ICD-10-CM | POA: Diagnosis not present

## 2023-01-10 DIAGNOSIS — E785 Hyperlipidemia, unspecified: Secondary | ICD-10-CM | POA: Diagnosis not present

## 2023-01-10 DIAGNOSIS — Z888 Allergy status to other drugs, medicaments and biological substances status: Secondary | ICD-10-CM | POA: Diagnosis not present

## 2023-01-10 DIAGNOSIS — I4510 Unspecified right bundle-branch block: Secondary | ICD-10-CM | POA: Diagnosis not present

## 2023-01-10 DIAGNOSIS — I509 Heart failure, unspecified: Secondary | ICD-10-CM | POA: Diagnosis not present

## 2023-01-10 LAB — CBC WITH DIFFERENTIAL/PLATELET
Abs Immature Granulocytes: 0.08 10*3/uL — ABNORMAL HIGH (ref 0.00–0.07)
Basophils Absolute: 0.1 10*3/uL (ref 0.0–0.1)
Basophils Relative: 1 %
Eosinophils Absolute: 0.2 10*3/uL (ref 0.0–0.5)
Eosinophils Relative: 4 %
HCT: 34.6 % — ABNORMAL LOW (ref 39.0–52.0)
Hemoglobin: 10.9 g/dL — ABNORMAL LOW (ref 13.0–17.0)
Immature Granulocytes: 2 %
Lymphocytes Relative: 21 %
Lymphs Abs: 1.1 10*3/uL (ref 0.7–4.0)
MCH: 31.1 pg (ref 26.0–34.0)
MCHC: 31.5 g/dL (ref 30.0–36.0)
MCV: 98.6 fL (ref 80.0–100.0)
Monocytes Absolute: 0.5 10*3/uL (ref 0.1–1.0)
Monocytes Relative: 9 %
Neutro Abs: 3.5 10*3/uL (ref 1.7–7.7)
Neutrophils Relative %: 63 %
Platelets: 135 10*3/uL — ABNORMAL LOW (ref 150–400)
RBC: 3.51 MIL/uL — ABNORMAL LOW (ref 4.22–5.81)
RDW: 14.4 % (ref 11.5–15.5)
WBC: 5.5 10*3/uL (ref 4.0–10.5)
nRBC: 0 % (ref 0.0–0.2)

## 2023-01-10 LAB — COMPREHENSIVE METABOLIC PANEL
ALT: 8 U/L (ref 0–44)
AST: 11 U/L — ABNORMAL LOW (ref 15–41)
Albumin: 3.2 g/dL — ABNORMAL LOW (ref 3.5–5.0)
Alkaline Phosphatase: 55 U/L (ref 38–126)
Anion gap: 11 (ref 5–15)
BUN: 13 mg/dL (ref 8–23)
CO2: 20 mmol/L — ABNORMAL LOW (ref 22–32)
Calcium: 8.9 mg/dL (ref 8.9–10.3)
Chloride: 108 mmol/L (ref 98–111)
Creatinine, Ser: 1.49 mg/dL — ABNORMAL HIGH (ref 0.61–1.24)
GFR, Estimated: 47 mL/min — ABNORMAL LOW (ref >60)
Glucose, Bld: 86 mg/dL (ref 70–99)
Potassium: 4.2 mmol/L (ref 3.5–5.1)
Sodium: 139 mmol/L (ref 135–145)
Total Bilirubin: 0.8 mg/dL (ref 0.3–1.2)
Total Protein: 5.9 g/dL — ABNORMAL LOW (ref 6.5–8.1)

## 2023-01-10 MED ORDER — SODIUM CHLORIDE 0.9% FLUSH: 3.0000 mL | INTRAVENOUS | Status: DC | PRN

## 2023-01-10 MED ORDER — SODIUM CHLORIDE 0.9% FLUSH
3.0000 mL | Freq: Two times a day (BID) | INTRAVENOUS | Status: DC
  Administered 2023-01-10 – 2023-01-14 (×8): 3 mL via INTRAVENOUS

## 2023-01-10 MED ORDER — ACETAMINOPHEN 325 MG PO TABS
650.0000 mg | ORAL_TABLET | ORAL | Status: DC | PRN
Start: 2023-01-10 — End: 2023-01-15

## 2023-01-10 MED ORDER — METOPROLOL SUCCINATE ER 25 MG PO TB24
37.5000 mg | ORAL_TABLET | Freq: Two times a day (BID) | ORAL | Status: DC
  Administered 2023-01-10 – 2023-01-11 (×3): 37.5 mg via ORAL
  Filled 2023-01-10 (×4): qty 2

## 2023-01-10 MED ORDER — ONDANSETRON HCL 4 MG/2ML IJ SOLN
4.0000 mg | Freq: Four times a day (QID) | INTRAMUSCULAR | Status: DC | PRN
Start: 2023-01-10 — End: 2023-01-15

## 2023-01-10 MED ORDER — FUROSEMIDE 10 MG/ML IJ SOLN
40.0000 mg | Freq: Two times a day (BID) | INTRAMUSCULAR | Status: DC
  Administered 2023-01-10 – 2023-01-11 (×2): 40 mg via INTRAVENOUS
  Filled 2023-01-10 (×2): qty 4

## 2023-01-10 MED ORDER — APIXABAN 5 MG PO TABS: 5.0000 mg | ORAL_TABLET | Freq: Two times a day (BID) | ORAL | Status: DC

## 2023-01-10 MED ORDER — SODIUM CHLORIDE 0.9 % IV SOLN: 250.0000 mL | INTRAVENOUS | Status: AC | PRN

## 2023-01-10 MED ORDER — SACUBITRIL-VALSARTAN 97-103 MG PO TABS
1.0000 | ORAL_TABLET | Freq: Two times a day (BID) | ORAL | Status: DC
  Administered 2023-01-10 – 2023-01-11 (×3): 1 via ORAL
  Filled 2023-01-10 (×5): qty 1

## 2023-01-10 NOTE — Progress Notes (Signed)
Admit  

## 2023-01-10 NOTE — Patient Instructions (Signed)
Medication Instructions:  Start Eliquis 5 mg ( Take 1 Tablet Twice Daily). *If you need a refill on your cardiac medications before your next appointment, please call your pharmacy*   Lab Work: No labs If you have labs (blood work) drawn today and your tests are completely normal, you will receive your results only by: MyChart Message (if you have MyChart) OR A paper copy in the mail If you have any lab test that is abnormal or we need to change your treatment, we will call you to review the results.   Testing/Procedures: No Testing   Follow-Up: At [facility], you and your health needs are our priority.  As part of our continuing mission to provide you with exceptional heart care, we have created designated Provider Care Teams.  These Care Teams include your primary Cardiologist (physician) and Advanced Practice Providers (APPs -  Physician Assistants and Nurse Practitioners) who all work together to provide you with the care you need, when you need it.  We recommend signing up for the patient portal called "MyChart".  Sign up information is provided on this After Visit Summary.  MyChart is used to connect with patients for Virtual Visits (Telemedicine).  Patients are able to view lab/test results, encounter notes, upcoming appointments, etc.  Non-urgent messages can be sent to your provider as well.   To learn more about what you can do with MyChart, go to [email or web address].    Your next appointment:   To Be Determined  Provider:   [name], MD

## 2023-01-10 NOTE — Plan of Care (Signed)
  Problem: Pain Management: Goal: General experience of comfort will improve Outcome: Progressing   Problem: Education: Goal: Ability to demonstrate management of disease process will improve Outcome: Progressing Goal: Ability to verbalize understanding of medication therapies will improve Outcome: Progressing   Problem: Activity: Goal: Capacity to carry out activities will improve Outcome: Progressing   Problem: Cardiac: Goal: Ability to achieve and maintain adequate cardiopulmonary perfusion will improve Outcome: Progressing

## 2023-01-11 ENCOUNTER — Inpatient Hospital Stay (HOSPITAL_COMMUNITY): Payer: Medicare Other

## 2023-01-11 ENCOUNTER — Other Ambulatory Visit (HOSPITAL_COMMUNITY): Payer: Self-pay

## 2023-01-11 DIAGNOSIS — I5021 Acute systolic (congestive) heart failure: Secondary | ICD-10-CM

## 2023-01-11 DIAGNOSIS — I5023 Acute on chronic systolic (congestive) heart failure: Secondary | ICD-10-CM | POA: Diagnosis not present

## 2023-01-11 LAB — ECHOCARDIOGRAM COMPLETE
AR max vel: 2.66 cm2
AV Area VTI: 1.8 cm2
AV Area mean vel: 2.56 cm2
AV Mean grad: 2.3 mm[Hg]
AV Peak grad: 4.5 mm[Hg]
Ao pk vel: 1.06 m/s
Area-P 1/2: 6.32 cm2
Height: 71 in
S' Lateral: 5.7 cm
Weight: 3432.12 [oz_av]

## 2023-01-11 LAB — TSH: TSH: 2.296 u[IU]/mL (ref 0.350–4.500)

## 2023-01-11 LAB — MAGNESIUM: Magnesium: 1.7 mg/dL (ref 1.7–2.4)

## 2023-01-11 LAB — BASIC METABOLIC PANEL
Anion gap: 8 (ref 5–15)
BUN: 14 mg/dL (ref 8–23)
CO2: 25 mmol/L (ref 22–32)
Calcium: 9.1 mg/dL (ref 8.9–10.3)
Chloride: 108 mmol/L (ref 98–111)
Creatinine, Ser: 1.57 mg/dL — ABNORMAL HIGH (ref 0.61–1.24)
GFR, Estimated: 44 mL/min — ABNORMAL LOW (ref >60)
Glucose, Bld: 83 mg/dL (ref 70–99)
Potassium: 4 mmol/L (ref 3.5–5.1)
Sodium: 141 mmol/L (ref 135–145)

## 2023-01-11 MED ORDER — PANTOPRAZOLE SODIUM 40 MG PO TBEC
40.0000 mg | DELAYED_RELEASE_TABLET | Freq: Every day | ORAL | Status: DC
  Administered 2023-01-11 – 2023-01-15 (×5): 40 mg via ORAL
  Filled 2023-01-11 (×5): qty 1

## 2023-01-11 MED ORDER — FOLIC ACID 1 MG PO TABS
1.0000 mg | ORAL_TABLET | Freq: Every day | ORAL | Status: DC
  Administered 2023-01-11 – 2023-01-15 (×5): 1 mg via ORAL
  Filled 2023-01-11 (×5): qty 1

## 2023-01-11 MED ORDER — APIXABAN 2.5 MG PO TABS
2.5000 mg | ORAL_TABLET | Freq: Two times a day (BID) | ORAL | Status: DC
  Administered 2023-01-11 (×2): 2.5 mg via ORAL
  Filled 2023-01-11 (×3): qty 1

## 2023-01-11 MED ORDER — MOMETASONE FURO-FORMOTEROL FUM 200-5 MCG/ACT IN AERO
2.0000 | INHALATION_SPRAY | Freq: Two times a day (BID) | RESPIRATORY_TRACT | Status: DC
  Administered 2023-01-11 – 2023-01-15 (×7): 2 via RESPIRATORY_TRACT
  Filled 2023-01-11: qty 8.8

## 2023-01-11 MED ORDER — MONTELUKAST SODIUM 10 MG PO TABS
10.0000 mg | ORAL_TABLET | Freq: Every day | ORAL | Status: DC
  Administered 2023-01-11 – 2023-01-14 (×4): 10 mg via ORAL
  Filled 2023-01-11 (×4): qty 1

## 2023-01-11 MED ORDER — ROSUVASTATIN CALCIUM 5 MG PO TABS
10.0000 mg | ORAL_TABLET | Freq: Every day | ORAL | Status: DC
  Administered 2023-01-11 – 2023-01-15 (×5): 10 mg via ORAL
  Filled 2023-01-11 (×5): qty 2

## 2023-01-11 MED ORDER — FUROSEMIDE 10 MG/ML IJ SOLN
60.0000 mg | Freq: Two times a day (BID) | INTRAMUSCULAR | Status: DC
  Administered 2023-01-11 – 2023-01-12 (×3): 60 mg via INTRAVENOUS
  Filled 2023-01-11 (×3): qty 6

## 2023-01-11 MED ORDER — GABAPENTIN 300 MG PO CAPS
300.0000 mg | ORAL_CAPSULE | Freq: Every day | ORAL | Status: DC
  Administered 2023-01-11 – 2023-01-14 (×4): 300 mg via ORAL
  Filled 2023-01-11 (×4): qty 1

## 2023-01-11 MED ORDER — VITAMIN B-12 1000 MCG PO TABS
1000.0000 ug | ORAL_TABLET | Freq: Every day | ORAL | Status: DC
  Administered 2023-01-11 – 2023-01-15 (×5): 1000 ug via ORAL
  Filled 2023-01-11 (×5): qty 1

## 2023-01-11 MED ORDER — SPIRONOLACTONE 25 MG PO TABS
25.0000 mg | ORAL_TABLET | Freq: Every day | ORAL | Status: DC
  Administered 2023-01-11: 25 mg via ORAL
  Filled 2023-01-11 (×2): qty 1

## 2023-01-11 MED ORDER — ALBUTEROL SULFATE (2.5 MG/3ML) 0.083% IN NEBU
3.0000 mL | INHALATION_SOLUTION | Freq: Four times a day (QID) | RESPIRATORY_TRACT | Status: DC | PRN
  Filled 2023-01-11: qty 3

## 2023-01-11 MED ORDER — METOPROLOL SUCCINATE ER 25 MG PO TB24: 37.5000 mg | ORAL_TABLET | Freq: Every day | ORAL | Status: DC

## 2023-01-11 MED ORDER — ALBUTEROL SULFATE (2.5 MG/3ML) 0.083% IN NEBU
2.5000 mg | INHALATION_SOLUTION | RESPIRATORY_TRACT | Status: DC
  Filled 2023-01-11 (×4): qty 3

## 2023-01-11 MED ORDER — ALBUTEROL SULFATE (2.5 MG/3ML) 0.083% IN NEBU
3.0000 mL | INHALATION_SOLUTION | Freq: Two times a day (BID) | RESPIRATORY_TRACT | Status: DC
  Administered 2023-01-11 – 2023-01-12 (×2): 3 mL via RESPIRATORY_TRACT
  Filled 2023-01-11 (×2): qty 3

## 2023-01-11 NOTE — H&P (Signed)
Encounter Date: 01/10/2023  Related encounter: Office Visit from 01/10/2023 in [facility]   Attested        Attestation signed by Thurmon Fair, MD at 01/11/2023  7:51 AM   I have seen and examined the patient along with Joni Reining, NP.  I have reviewed the chart, notes and new data.  I agree with PA/NP's note.     PLAN: Newly diagnosed atrial fibrillation in the setting of acute HFrEF exacerbation refractory to outpatient diuretic thearpy. Admit for IV diuretics, initiate anticoagulation, consider TEE guided DCCV if atrial fibrillation is persistent and appears to be preventing HF compensation.   Thurmon Fair, MD, [facility] [phone] 01/11/2023, 7:49 AM            Cardiology Office Note:  .   Date:  01/10/2023  ID:  Brandon [name], DOB March 03, 1941, MRN [id number] PCP: [name], NP  [facility] Providers Cardiologist: [name], MD    History of Present Illness: .   Brandon [name] is a 82 y.o. male with history of cardiomyopathy, most recent EF 45 to 50%, CVA, (right thalamic lesion), no evidence of atrial fibrillation on cardiac monitor, hypertension, OSA on CPAP, chronic kidney disease stage III (creatinine 1.45).  Last seen by Dr. Antoine Poche on 12/01/2022.   He comes today with worsening lower extremity edema over the last 2 weeks.  He continues to have some mild dyspnea on exertion.  He denies any dietary indiscretion although he did states he had some [place] food but does not remember how long ago it was.  He has been medically compliant.  He did increase his Lasix to 40 mg daily for 3 days and did not have any appreciable diuresis associated with this.  Denies chest pain, or palpitations.  He does have PND chronically.  Only mild weight gain.   ROS: As above otherwise negative.   Studies Reviewed: Marland Kitchen   EKG Interpretation Date/Time:                  Monday January 10 2023 15:09:59  EDT Ventricular Rate:         85 PR Interval:                   QRS Duration:             152 QT Interval:                 430 QTC Calculation:511 R Axis:                         -71   Text Interpretation:Atrial fibrillation with premature ventricular or aberrantly conducted complexes Left axis deviation Right bundle branch block When compared with ECG of 18-Apr-2022 14:34, Atrial fibrillation has replaced Sinus rhythm QT has lengthened Confirmed by Joni Reining [phone]) on 01/10/2023 3:26:58 PM  Echocardiogram 04/18/2022 1. Left ventricular ejection fraction, by estimation, is 45 to 50%. The  left ventricle has mildly decreased function. The left ventricle  demonstrates global hypokinesis. Left ventricular diastolic parameters are  indeterminate.   2. Right ventricular systolic function is normal. The right ventricular  size is normal.   3. Left atrial size was moderately dilated.   4. No evidence of mitral valve regurgitation.   5. The aortic valve is grossly normal. Aortic valve regurgitation is not  visualized.   6. The inferior vena cava is normal in size with greater than  50%  respiratory variability, suggesting right atrial pressure of 3 mmHg.      EKG Interpretation Date/Time:                  Monday January 10 2023 15:09:59 EDT Ventricular Rate:         85 PR Interval:                   QRS Duration:             152 QT Interval:                 430 QTC Calculation:        511 R Axis:                         -71   Text Interpretation:      Atrial fibrillation with premature ventricular or aberrantly conducted complexes Left axis deviation Right bundle branch block When compared with ECG of 18-Apr-2022 14:34, Atrial fibrillation has replaced Sinus rhythm QT has lengthened Confirmed by Joni Reining [phone]) on 01/10/2023 3:26:58 PM     Physical Exam:   VS:  BP 108/66   Pulse 77   Ht 5\' 11"  (1.803 m)   Wt 223 lb 12.8 oz (101.5 kg)   BMI 31.21 kg/m       Wt  Readings from Last 3 Encounters:  01/10/23 223 lb 12.8 oz (101.5 kg)  12/20/22 221 lb (100.2 kg)  12/01/22 222 lb 3.2 oz (100.8 kg)    GEN: Well nourished, well developed in no acute distress NECK: No JVD; No carotid bruits CARDIAC: IRRR, no murmurs, rubs, gallops RESPIRATORY:  Clear to auscultation with exception of right base crackles. ABDOMEN: Soft, non-tender, non-distended EXTREMITIES: Very tight bilateral edema up to the thighs.  No deformity    ASSESSMENT AND PLAN: .     Acute on chronic systolic heart failure: Last seen by Dr. Antoine Poche approximately 1 month ago and assessment showed mild lower extremity edema.  He now has bilateral edema up to the thighs.  Very little weight gain.    2. New Onset Atrial fib: Found to be in atrial fibrillation today.  Likely etiology for worsening CHF.  Will start him on Eliquis 5 mg twice daily and have him admitted as a direct admit for IV diuresis, possible DCCV, repeat echocardiogram, would continue Entresto, and spironolactone.  Adjust vent based upon kidney function.  I have discussed this with Dr. Royann Shivers, DOD in the office today, who is agreement with my assessment and plan, and need for admission for treatment.    3.   Hypertension: Blood pressure is low normal for reduced EF.  He continues on Entresto 97/23 mg, spironolactone 25 mg daily, and amlodipine 10 mg daily.   3.  Hypercholesterolemia: Continue on statin therapy.     Signed, Bettey Mare. Liborio Nixon, ANP, AACC          Cosigned by: Thurmon Fair, MD at 01/11/2023  7:51 AM

## 2023-01-11 NOTE — TOC Benefit Eligibility Note (Signed)
Patient Product/process development scientist completed.    The patient is insured through [facility]. Patient has Medicare and is not eligible for a copay card, but may be able to apply for patient assistance, if available.    Ran test claim for Eliquis 5 mg and the current 30 day co-pay is $149.53 due to being in Coverage Gap (donut hole).   This test claim was processed through [facility]- copay amounts may vary at other pharmacies due to pharmacy/plan contracts, or as the patient moves through the different stages of their insurance plan.     Roland Earl, CPHT Pharmacy Technician III Certified Patient [facility] Patient Advocate Team Direct Number: [phone]  Fax: [phone]

## 2023-01-11 NOTE — Discharge Instructions (Addendum)
Medication Changes: - START Jardiance 10mg  daily. - STOP Aspirin and START Eliquis 2.5mg  twice daily.  - STOP Amlodipine. - STOP Entresto. - STOP Spironolactone. - DECREASE Metoprolol succinate (Toprol-XL) to 25mg  daily.  ___________________________________________________________________________________________________________________________________________________________ Information on my medicine - ELIQUIS (apixaban)  This medication education was reviewed with me or my healthcare representative as part of my discharge preparation.  Why was Eliquis prescribed for you? Eliquis was prescribed for you to reduce the risk of a blood clot forming that can cause a stroke if you have a medical condition called atrial fibrillation (a type of irregular heartbeat).  What do You need to know about Eliquis ? Take your Eliquis TWICE DAILY - one tablet in the morning and one tablet in the evening with or without food. If you have difficulty swallowing the tablet whole please discuss with your pharmacist how to take the medication safely.  Take Eliquis exactly as prescribed by your doctor and DO NOT stop taking Eliquis without talking to the doctor who prescribed the medication.  Stopping may increase your risk of developing a stroke.  Refill your prescription before you run out.  After discharge, you should have regular check-up appointments with your healthcare provider that is prescribing your Eliquis.  In the future your dose may need to be changed if your kidney function or weight changes by a significant amount or as you get older.  What do you do if you miss a dose? If you miss a dose, take it as soon as you remember on the same day and resume taking twice daily.  Do not take more than one dose of ELIQUIS at the same time to make up a missed dose.  Important Safety Information A possible side effect of Eliquis is bleeding. You should call your healthcare provider right away if you  experience any of the following: Bleeding from an injury or your nose that does not stop. Unusual colored urine (red or dark brown) or unusual colored stools (red or black). Unusual bruising for unknown reasons. A serious fall or if you hit your head (even if there is no bleeding).  Some medicines may interact with Eliquis and might increase your risk of bleeding or clotting while on Eliquis. To help avoid this, consult your healthcare provider or pharmacist prior to using any new prescription or non-prescription medications, including herbals, vitamins, non-steroidal anti-inflammatory drugs (NSAIDs) and supplements.  This website has more information on Eliquis (apixaban): http://www.eliquis.com/eliquis/home

## 2023-01-11 NOTE — Progress Notes (Signed)
   01/10/23 1922  Vitals  Temp 98.6 F (37 C)  Temp Source Oral  BP 97/66  MAP (mmHg) 75  BP Location Right Arm  BP Method Automatic  Patient Position (if appropriate) Sitting  Pulse Rate 80  Pulse Rate Source Monitor  ECG Heart Rate 85  Resp 20  Level of Consciousness  Level of Consciousness Alert  MEWS COLOR  MEWS Score Color Green  Oxygen Therapy  SpO2 100 %  O2 Device Room Air  Pain Assessment  Pain Scale 0-10  Pain Score 0  PCA/Epidural/Spinal Assessment  Respiratory Pattern Regular;Dyspnea with exertion  MEWS Score  MEWS Temp 0  MEWS Systolic 1  MEWS Pulse 0  MEWS RR 0  MEWS LOC 0  MEWS Score 1   Pt admitted to [facility]. Pt alert and oriented. Pt oriented to unit. Call light by pt. CHG bath given. Joni Reining M.,NP notified.   Pt requested for Albuterol inhaler for his Asthma. Kamal Henderson,MD notified.  Orders given.

## 2023-01-11 NOTE — Progress Notes (Signed)
Mobility Specialist Progress Note:   01/11/23 1229  Mobility  Activity Ambulated with assistance in hallway  Level of Assistance Contact guard assist, steadying assist  Assistive Device None  Distance Ambulated (ft) 375 ft  Activity Response Tolerated well  Mobility Referral Yes  $Mobility charge 1 Mobility  Mobility Specialist Start Time (ACUTE ONLY) 1215  Mobility Specialist Stop Time (ACUTE ONLY) 1222  Mobility Specialist Time Calculation (min) (ACUTE ONLY) 7 min   Pre Mobility: 82 HR , 98/73 BP , 94% SpO2 Post Mobility: 88 HR   Pt received in bed, agreeable to mobility. Denied any discomfort during ambulation. Asx throughout. Pt returned to bed with call bell in reach and all needs met.    Leory Plowman  Mobility Specialist Please contact via [employer] office at [phone]

## 2023-01-11 NOTE — Progress Notes (Signed)
*  PRELIMINARY RESULTS* Echocardiogram 2D Echocardiogram has been performed.  Brandon [name] 01/11/2023, 9:58 AM

## 2023-01-11 NOTE — Plan of Care (Signed)
  Problem: Clinical Measurements: Goal: Respiratory complications will improve Outcome: Progressing   Problem: Clinical Measurements: Goal: Diagnostic test results will improve Outcome: Progressing   Problem: Clinical Measurements: Goal: Will remain free from infection Outcome: Progressing   Problem: Education: Goal: Knowledge of General Education information will improve Description: Including pain rating scale, medication(s)/side effects and non-pharmacologic comfort measures Outcome: Progressing

## 2023-01-11 NOTE — Progress Notes (Signed)
PHARMACY - ANTICOAGULATION CONSULT NOTE  Pharmacy Consult for apixaban Indication: atrial fibrillation  Allergies  Allergen Reactions   Ace Inhibitors Cough   Isosorb Dinitrate-Hydralazine Other (See Comments)    REACTION: dizziness/hypotensive    Patient Measurements: Weight: 97.3 kg (214 lb 8.1 oz)  Vital Signs: Temp: 98.7 F (37.1 C) (10/29 0812) Temp Source: Oral (10/29 0812) BP: 107/76 (10/29 0815) Pulse Rate: 82 (10/29 0815)  Labs: Recent Labs    01/10/23 2124 01/11/23 0422  HGB 10.9*  --   HCT 34.6*  --   PLT 135*  --   CREATININE 1.49* 1.57*    Estimated Creatinine Clearance: 43.2 mL/min (A) (by C-G formula based on SCr of 1.57 mg/dL (H)).   Medical History: Past Medical History:  Diagnosis Date   Anemia    Asthma    Hx of childhood asthma, disappeared for a while, then resurfaced 6-7 years ago.    Cardiomyopathy    with a negative cardiac catheterization in the past. 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     Medications:  Medications Prior to Admission  Medication Sig Dispense Refill Last Dose   albuterol (PROVENTIL) (2.5 MG/3ML) 0.083% nebulizer solution TAKE 3 MLS BY NEBULIZER EVERY 6 HOURS AS NEEDED FOR WHEEZING FOR SHORTNESS OF BREATH 150 mL 3 01/10/2023   albuterol (VENTOLIN HFA) 108 (90 Base) MCG/ACT inhaler Inhale 1-2 puffs into the lungs every 6 (six) hours as needed for wheezing or shortness of breath. 18 g 5 01/10/2023   amLODipine (NORVASC) 10 MG tablet Take 1 tablet by mouth once daily 90 tablet 0 01/10/2023   apixaban (ELIQUIS) 5 MG TABS tablet Take 1 tablet (5 mg total) by mouth 2 (two) times daily.   01/10/2023   aspirin EC 81 MG tablet Take 81 mg by mouth daily. Swallow whole.   01/10/2023   cyanocobalamin (VITAMIN B12) 1000 MCG  tablet Take 1 tablet (1,000 mcg total) by mouth daily.   01/10/2023   fluticasone-salmeterol (ADVAIR) 500-50 MCG/ACT AEPB Inhale 1 puff into the lungs in the morning and at bedtime. 60 each 6 01/10/2023   folic acid (FOLVITE) 1 MG tablet Take 1 tablet (1 mg total) by mouth daily.   01/10/2023   furosemide (LASIX) 20 MG tablet Take 1 tablet (20 mg total) by mouth daily. 30 tablet 3 01/10/2023   leflunomide (ARAVA) 20 MG tablet Take 20 mg by mouth daily.   01/10/2023   metoprolol succinate (TOPROL-XL) 25 MG 24 hr tablet Take 1.5 tablets (37.5 mg total) by mouth daily. 150 tablet 1 01/10/2023   montelukast (SINGULAIR) 10 MG tablet Take 1 tablet (10 mg total) by mouth at bedtime. 90 tablet 1 01/10/2023   omeprazole (PRILOSEC) 20 MG capsule Take 1 capsule (20 mg total) by mouth daily. 90 capsule 3 01/10/2023   rosuvastatin (CRESTOR) 10 MG tablet Take 1 tablet (10 mg total) by mouth daily. 90 tablet 1 01/10/2023   sacubitril-valsartan (ENTRESTO) 97-103 MG Take 1 tablet by mouth 2 (two) times daily. (For medication assistance program) 180 tablet 3 01/10/2023   spironolactone (ALDACTONE) 25 MG tablet Take 1 tablet (25 mg total) by mouth daily. 90 tablet 1 01/10/2023   tamsulosin (FLOMAX) 0.4 MG CAPS capsule Take 1 capsule by mouth once  daily 90 capsule 0 01/10/2023   gabapentin (NEURONTIN) 300 MG capsule Take 1 capsule (300 mg total) by mouth at bedtime. 90 capsule 1 Unknown    Assessment: 82 yo M presents with worsening b/l LE edema x 2 weeks w/ DOE despite increasing home furosemide therapy. Found to be in AFib upon admission, new-onset. Pharmacy consulted to dose apixaban for Afib.   Hgb 10.9, Plt 135 No s/sx of bleeding SCr up to 1.57 today  Goal of Therapy:  Monitor platelets by anticoagulation protocol: Yes   Plan:  Start apixaban 2.5mg  po BID for Afib with age >53, weight > 60kg, and Scr > 1.5.  Monitor daily CBC and for s/sx of bleeding F/u plans for possible DCCV Check copay cost of  apixaban Pharmacy to provide patient education on apixaban prior to discharge   Wilburn Cornelia, PharmD, BCPS Clinical Pharmacist 01/11/2023 9:30 AM   Please refer to [facility] for pharmacy phone number

## 2023-01-11 NOTE — Progress Notes (Addendum)
Rounding Note    Patient Name: Brandon [name] Date of Encounter: 01/11/2023   [facility] Cardiologist: [name], MD   Subjective   LE edema improved, thinks he's been in Afib "for a while."  Inpatient Medications    Scheduled Meds:  albuterol  2.5 mg Inhalation Q4H   furosemide  40 mg Intravenous Q12H   metoprolol succinate  37.5 mg Oral BID   sacubitril-valsartan  1 tablet Oral BID   sodium chloride flush  3 mL Intravenous Q12H   Continuous Infusions:  sodium chloride     PRN Meds: sodium chloride, acetaminophen, albuterol, ondansetron (ZOFRAN) IV, sodium chloride flush   Vital Signs    Vitals:   01/11/23 0300 01/11/23 0409 01/11/23 0812 01/11/23 0815  BP:  118/71 107/76 107/76  Pulse:  91 82 82  Resp:  20 15   Temp:  98.2 F (36.8 C) 98.7 F (37.1 C)   TempSrc: Oral Oral Oral   SpO2:  95%    Weight:  97.3 kg      Intake/Output Summary (Last 24 hours) at 01/11/2023 0837 Last data filed at 01/11/2023 0748 Gross per 24 hour  Intake --  Output 2300 ml  Net -2300 ml      01/11/2023    4:09 AM 01/10/2023    2:48 PM 12/20/2022   11:48 AM  Last 3 Weights  Weight (lbs) 214 lb 8.1 oz 223 lb 12.8 oz 221 lb  Weight (kg) 97.3 kg 101.515 kg 100.245 kg      Telemetry    Rate controlled Afib with PVCs and NSVT - Personally Reviewed  ECG    No new tracings - Personally Reviewed  Physical Exam   GEN: No acute distress.   Neck: No JVD Cardiac: RRR, no murmurs, rubs, or gallops.  Respiratory: no wheezing/crackles but breath sounds diminished GI: Soft, nontender, non-distended  MS: LE edema to thighs Neuro:  Nonfocal  Psych: Normal affect   Labs    High Sensitivity Troponin:  No results for input(s): "TROPONINIHS" in the last 720 hours.   Chemistry Recent Labs  Lab 01/10/23 2124 01/11/23 0422  NA 139 141  K 4.2 4.0  CL 108 108  CO2 20* 25  GLUCOSE 86 83  BUN 13 14  CREATININE 1.49* 1.57*  CALCIUM 8.9 9.1  PROT 5.9*  --    ALBUMIN 3.2*  --   AST 11*  --   ALT 8  --   ALKPHOS 55  --   BILITOT 0.8  --   GFRNONAA 47* 44*  ANIONGAP 11 8    Lipids No results for input(s): "CHOL", "TRIG", "HDL", "LABVLDL", "LDLCALC", "CHOLHDL" in the last 168 hours.  Hematology Recent Labs  Lab 01/10/23 2124  WBC 5.5  RBC 3.51*  HGB 10.9*  HCT 34.6*  MCV 98.6  MCH 31.1  MCHC 31.5  RDW 14.4  PLT 135*   Thyroid No results for input(s): "TSH", "FREET4" in the last 168 hours.  BNPNo results for input(s): "BNP", "PROBNP" in the last 168 hours.  DDimer No results for input(s): "DDIMER" in the last 168 hours.   Radiology    No results found.  Cardiac Studies   Echo pending  Patient Profile     82 y.o. male with history of cardiomyopathy, most recent EF 45 to 50%, CVA, (right thalamic lesion), no evidence of atrial fibrillation on cardiac monitor, hypertension, OSA on CPAP, chronic kidney disease stage III (creatinine 1.45). He was admitted from clinic for  acute decompensated heart failure with plans for IV diuresis and possible TEE-DCCV.  Assessment & Plan    Acute on chronic systolic heart failure - hx of LVEF 35-40% improved on aggressive GDMT to low normal 45-50% (echo 04/2022) - presented to clinic with worsening LE edema x 2 weeks despite increasing lasix to 40 mg daily x 3 days - echo 04/2022 with LVEF 45-50% - echo this admission pending - BNP pending - CXR reviewed, final read pending  - receiving 40 mg IV lasix BID with 2 L urine output yesterday and weight not yet charted - continue diuresis today - GDMT PTA: 97-103 mg entresto BID, 25 mg spironolactone, toprol, 20 mg lasix daily - question if Afib now contributing to CHF exacerbation - possible dietary indiscretion   Atrial fibrillation - new onset PVCs, NSVT - rate controlled - RBBB (old) - on home dose of 37.5 mg toprol BID - may ultimately need TEE-guided DCCV - collect Mg and TSH for completeness   Need for anticoagulation - eliquis not  yet started - will start 2.5 mg BID, appropriately dosed for age and creatinine   Hypertension - managed in the context of CHF   CKD 3 - sCr 1.57 - appears at baseline    For questions or updates, please contact [facility] Please consult [email or web address] for contact info under        Signed, Marcelino Duster, PA  01/11/2023, 8:37 AM     Personally seen and examined. 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 This led to hospital admission and initiation of IV diuretics.  He still feels short of breath.  No palpitations in AF.  No chest pain.  LE has improved.  Physical examination revealed an irregular heart rate, a systolic murmur, crackles in the middle lobe, pitting edema in the lower extremities up to the thighs, and jugular venous distention. Telemetry showed atrial fibrillation with premature ventricular contractions (PVCs) and non-sustained ventricular tachycardia.  The patient's medication regimen includes Lasix, Entresto, spironolactone, and metoprolol.  The patient's hypertension is expected to be controlled with the treatment of his acute and chronic biventricular heart failure. He is starting anticoagulation and will not need heparin prophylaxis for that reason.  Tele:  Atrial fibrillation with PVCs and non-sustained ventricular tachycardia (01/11/2023)  Personally reviewed relevant tests; Echo today shows a significant worsening of the patient's cardiomyopathy was observed, characterized by biventricular dilation and dysfunction, along with moderate to severe mitral regurgitation. This occurred in the context of atrial fibrillation controlled at a rate of 100.  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 Echocardiogram shows significantly worsened cardiomyopathy with biventricular dilation and worsening LV dysfunction, moderate to severe mitral regurgitation.  -Increase Lasix to 60mg  IV BID. -Continue Entresto and Spironolactone. -Continue Metoprolol at current dose due to balance between worsening heart failure, atrial fibrillation, and non-sustained ventricular tachycardia. -Check pending TSH and magnesium. -Plan for TEE cardioversion when euvolemic.  Atrial Fibrillation - New diagnosis. Telemetry shows atrial fibrillation with PVCs and non-sustained ventricular tachycardia.  Some NSVT is actually AF with RBBB -Start Eliquis. Consider patient assistance for cost (~ $138 month due to donut hole). If not affordable with patient assist,  may need to switch to Coumadin in outpatient setting.  Chronic Kidney Disease (CKD) Stage 3B Baseline creatinine of 1.5. -Monitor kidney function during hospital stay.  Obstructive Sleep Apnea (OSA) On CPAP. -Encourage use of CPAP during hospital stay.  Hypertension Will be controlled with treatment of acute on chronic biventricular heart failure.  General Health Maintenance / Followup Plans -Start SGLT2 inhibitor tomorrow if stable -Ensure lab follow up. -Continue full code status. -Do not need heparin prophylaxis due to initiation of anticoagulation.  [name], MD FASE [facility] Cardiologist [facility]  [address] [place], [address] [place], [place] [address] [phone]  12:00 PM

## 2023-01-12 ENCOUNTER — Telehealth: Payer: Self-pay | Admitting: Cardiology

## 2023-01-12 ENCOUNTER — Other Ambulatory Visit (HOSPITAL_COMMUNITY): Payer: Self-pay

## 2023-01-12 DIAGNOSIS — I5023 Acute on chronic systolic (congestive) heart failure: Secondary | ICD-10-CM | POA: Diagnosis not present

## 2023-01-12 LAB — BASIC METABOLIC PANEL
Anion gap: 10 (ref 5–15)
BUN: 14 mg/dL (ref 8–23)
CO2: 26 mmol/L (ref 22–32)
Calcium: 9.3 mg/dL (ref 8.9–10.3)
Chloride: 106 mmol/L (ref 98–111)
Creatinine, Ser: 1.78 mg/dL — ABNORMAL HIGH (ref 0.61–1.24)
GFR, Estimated: 38 mL/min — ABNORMAL LOW (ref >60)
Glucose, Bld: 79 mg/dL (ref 70–99)
Potassium: 3.7 mmol/L (ref 3.5–5.1)
Sodium: 142 mmol/L (ref 135–145)

## 2023-01-12 LAB — APTT
aPTT: 36 s (ref 24–36)
aPTT: 65 s — ABNORMAL HIGH (ref 24–36)

## 2023-01-12 LAB — CBC
HCT: 33.1 % — ABNORMAL LOW (ref 39.0–52.0)
Hemoglobin: 10.5 g/dL — ABNORMAL LOW (ref 13.0–17.0)
MCH: 30.1 pg (ref 26.0–34.0)
MCHC: 31.7 g/dL (ref 30.0–36.0)
MCV: 94.8 fL (ref 80.0–100.0)
Platelets: 147 10*3/uL — ABNORMAL LOW (ref 150–400)
RBC: 3.49 MIL/uL — ABNORMAL LOW (ref 4.22–5.81)
RDW: 14 % (ref 11.5–15.5)
WBC: 5.9 10*3/uL (ref 4.0–10.5)
nRBC: 0 % (ref 0.0–0.2)

## 2023-01-12 LAB — HEPARIN LEVEL (UNFRACTIONATED): Heparin Unfractionated: 0.49 [IU]/mL (ref 0.30–0.70)

## 2023-01-12 MED ORDER — HEPARIN (PORCINE) 25000 UT/250ML-% IV SOLN
1300.0000 [IU]/h | INTRAVENOUS | Status: DC
  Administered 2023-01-12: 1300 [IU]/h via INTRAVENOUS
  Filled 2023-01-12: qty 250

## 2023-01-12 MED ORDER — MAGNESIUM SULFATE 2 GM/50ML IV SOLN
2.0000 g | Freq: Once | INTRAVENOUS | Status: AC
  Administered 2023-01-12: 2 g via INTRAVENOUS
  Filled 2023-01-12: qty 50

## 2023-01-12 MED ORDER — HEPARIN (PORCINE) 25000 UT/250ML-% IV SOLN
1400.0000 [IU]/h | INTRAVENOUS | Status: DC
  Administered 2023-01-12 – 2023-01-13 (×2): 1400 [IU]/h via INTRAVENOUS
  Filled 2023-01-12: qty 250

## 2023-01-12 NOTE — Telephone Encounter (Signed)
New message:     Patient says  he is a patient in [facility] and is scheduled for a Cath. He would like for you to let Dr Antoine Poche know that he would like to talk to him this evening, before he gets his Cath tomorrow please.

## 2023-01-12 NOTE — Progress Notes (Signed)
Progress Note  Patient Name: Brandon [name] Date of Encounter: 01/12/2023 Primary Cardiologist: [name], MD   Subjective   Overnight I/Os not recorded Patient notes that he legs are less swollen.  Still have SOB.  Still have LE swelling.  Vital Signs    Vitals:   01/11/23 2327 01/12/23 0416 01/12/23 0700 01/12/23 0807  BP: 97/72 96/84  100/82  Pulse: 78 99    Resp:   20 20  Temp: 98.6 F (37 C) 98.7 F (37.1 C)  98.1 F (36.7 C)  TempSrc: Oral Oral  Oral  SpO2: 98% 98%  95%  Weight:  93.1 kg      Intake/Output Summary (Last 24 hours) at 01/12/2023 0938 Last data filed at 01/12/2023 0600 Gross per 24 hour  Intake 730 ml  Output 650 ml  Net 80 ml   Filed Weights   01/11/23 0409 01/12/23 0416  Weight: 97.3 kg 93.1 kg    Physical Exam   GEN: No acute distress.   Neck: JVD Cardiac: iRRR, no  rubs, or gallops. Systolic murmur Respiratory: Decreased breath sounds GI: Soft, nontender, non-distended  MS: bilateral edema worse in left leg, skin is arm  Labs   Telemetry: atrial fibrillation with PVCs but without true NSVT   Chemistry Recent Labs  Lab 01/10/23 2124 01/11/23 0422 01/12/23 0544  NA 139 141 142  K 4.2 4.0 3.7  CL 108 108 106  CO2 20* 25 26  GLUCOSE 86 83 79  BUN 13 14 14   CREATININE 1.49* 1.57* 1.78*  CALCIUM 8.9 9.1 9.3  PROT 5.9*  --   --   ALBUMIN 3.2*  --   --   AST 11*  --   --   ALT 8  --   --   ALKPHOS 55  --   --   BILITOT 0.8  --   --   GFRNONAA 47* 44* 38*  ANIONGAP 11 8 10      Hematology Recent Labs  Lab 01/10/23 2124 01/12/23 0544  WBC 5.5 5.9  RBC 3.51* 3.49*  HGB 10.9* 10.5*  HCT 34.6* 33.1*  MCV 98.6 94.8  MCH 31.1 30.1  MCHC 31.5 31.7  RDW 14.4 14.0  PLT 135* 147*    Cardiac EnzymesNo results for input(s): "TROPONINI" in the last 168 hours. No results for input(s): "TROPIPOC" in the last 168 hours.   BNPNo results for input(s): "BNP", "PROBNP" in the last 168 hours.   DDimer No results for  input(s): "DDIMER" in the last 168 hours.   Cardiac Studies   Cardiac Studies & Procedures       ECHOCARDIOGRAM  ECHOCARDIOGRAM COMPLETE 01/11/2023  Narrative ECHOCARDIOGRAM REPORT    Patient Name:   Brandon [name] Date of Exam: 01/11/2023 Medical Rec #:  [id number]       Height:       71.0 in Accession #:    [id number]      Weight:       214.5 lb Date of Birth:  [birth year]      BSA:          2.172 m Patient Age:    82 years        BP:           107/76 mmHg Patient Gender: M               HR:           74 bpm. Exam Location:  Inpatient  Procedure: 2D  Echo, Cardiac Doppler and Color Doppler  Indications:    CHF-Acute Systolic I50.21  History:        Patient has prior history of Echocardiogram examinations, most recent 04/18/2022. CHF, Stroke; Risk Factors:Dyslipidemia, Sleep Apnea and Hypertension.  Sonographer:    Dondra Prader RVT RCS Referring Phys: [id number] KATHRYN M LAWRENCE  IMPRESSIONS   1. Left ventricular ejection fraction, by estimation, is 20 to 25%. The left ventricle has severely decreased function. The left ventricle demonstrates global hypokinesis. The left ventricular internal cavity size was severely dilated. Left ventricular diastolic function could not be evaluated. 2. Right ventricular systolic function is normal. The right ventricular size is moderately enlarged. There is moderately elevated pulmonary artery systolic pressure. The estimated right ventricular systolic pressure is 48.4 mmHg. 3. Left atrial size was severely dilated. 4. Right atrial size was moderately dilated. 5. The mitral valve is normal in structure. Moderate to severe mitral valve regurgitation. No evidence of mitral stenosis. 6. The aortic valve is tricuspid. Aortic valve regurgitation is trivial. Aortic valve sclerosis/calcification is present, without any evidence of aortic stenosis. Aortic valve area, by VTI measures 1.80 cm. Aortic valve mean gradient measures 2.2 mmHg. Aortic valve  Vmax measures 1.06 m/s. 7. The inferior vena cava is normal in size with <50% respiratory variability, suggesting right atrial pressure of 8 mmHg.  FINDINGS Left Ventricle: Left ventricular ejection fraction, by estimation, is 20 to 25%. The left ventricle has severely decreased function. The left ventricle demonstrates global hypokinesis. The left ventricular internal cavity size was severely dilated. There is no left ventricular hypertrophy. Left ventricular diastolic function could not be evaluated.  Right Ventricle: The right ventricular size is moderately enlarged. No increase in right ventricular wall thickness. Right ventricular systolic function is normal. There is moderately elevated pulmonary artery systolic pressure. The tricuspid regurgitant velocity is 3.18 m/s, and with an assumed right atrial pressure of 8 mmHg, the estimated right ventricular systolic pressure is 48.4 mmHg.  Left Atrium: Left atrial size was severely dilated.  Right Atrium: Right atrial size was moderately dilated.  Pericardium: There is no evidence of pericardial effusion.  Mitral Valve: The mitral valve is normal in structure. Moderate to severe mitral valve regurgitation. No evidence of mitral valve stenosis.  Tricuspid Valve: The tricuspid valve is normal in structure. Tricuspid valve regurgitation is trivial. No evidence of tricuspid stenosis.  Aortic Valve: The aortic valve is tricuspid. Aortic valve regurgitation is trivial. Aortic valve sclerosis/calcification is present, without any evidence of aortic stenosis. Aortic valve mean gradient measures 2.2 mmHg. Aortic valve peak gradient measures 4.5 mmHg. Aortic valve area, by VTI measures 1.80 cm.  Pulmonic Valve: The pulmonic valve was normal in structure. Pulmonic valve regurgitation is not visualized. No evidence of pulmonic stenosis.  Aorta: The aortic root is normal in size and structure.  Venous: The inferior vena cava is normal in size with  less than 50% respiratory variability, suggesting right atrial pressure of 8 mmHg.  IAS/Shunts: No atrial level shunt detected by color flow Doppler.   LEFT VENTRICLE PLAX 2D LVIDd:         6.80 cm   Diastology LVIDs:         5.70 cm   LV e' medial:    6.73 cm/s LV PW:         0.90 cm   LV E/e' medial:  12.5 LV IVS:        1.10 cm   LV e' lateral:   7.70 cm/s LVOT diam:  2.20 cm   LV E/e' lateral: 10.9 LV SV:         33 LV SV Index:   15 LVOT Area:     3.80 cm  3D Volume EF: 3D EF:        23 % LV EDV:       263 ml LV ESV:       202 ml LV SV:        61 ml  RIGHT VENTRICLE            IVC RV Basal diam:  4.70 cm    IVC diam: 1.80 cm RV S prime:     9.58 cm/s TAPSE (M-mode): 1.9 cm  LEFT ATRIUM              Index        RIGHT ATRIUM           Index LA diam:        5.00 cm  2.30 cm/m   RA Area:     25.90 cm LA Vol (A2C):   170.0 ml 78.26 ml/m  RA Volume:   85.20 ml  39.22 ml/m LA Vol (A4C):   90.8 ml  41.80 ml/m LA Biplane Vol: 131.0 ml 60.31 ml/m AORTIC VALVE                    PULMONIC VALVE AV Area (Vmax):    2.66 cm     PV Vmax:       0.84 m/s AV Area (Vmean):   2.56 cm     PV Peak grad:  2.8 mmHg AV Area (VTI):     1.80 cm AV Vmax:           105.70 cm/s AV Vmean:          69.200 cm/s AV VTI:            0.186 m AV Peak Grad:      4.5 mmHg AV Mean Grad:      2.2 mmHg LVOT Vmax:         74.03 cm/s LVOT Vmean:        46.675 cm/s LVOT VTI:          0.088 m LVOT/AV VTI ratio: 0.47  AORTA Ao Root diam: 3.10 cm  MITRAL VALVE               TRICUSPID VALVE MV Area (PHT): 6.32 cm    TR Peak grad:   40.4 mmHg MV Decel Time: 120 msec    TR Vmax:        318.00 cm/s MV E velocity: 84.30 cm/s SHUNTS Systemic VTI:  0.09 m Systemic Diam: 2.20 cm  Armanda Magic MD Electronically signed by Armanda Magic MD Signature Date/Time: 01/11/2023/11:20:06 AM    Final    MONITORS  LONG TERM MONITOR-LIVE TELEMETRY (3-14 DAYS) 05/18/2022  Narrative Predominant rhythm  is normal sinus Eight runs of tachycardia rauns occurred.  The longest run was 15 beats. Frequent runs of SVT were noted.  These were at times sutained with the longest run being 1 hour and 21 minutes.  Rate was 135. Frequent isolated SVEs Rare ventricular ectopy.             Assessment & Plan   Acute on Chronic Biventricular Heart Failure HTN -Continue Lasix to 60mg  IV BID. - I am holding his GDMT for hypotension - I am holding his MRA for AKI - original with metoprolol, NSVT burden  improved; I have now stopped this for rising creatinine - TSH WNL - Mg 1.7 will replete - DDX of AKI include SCAI B Cardiogenic shock vs Cardiorenal etiology of unmasking CKD while getting closer to euvolemia vs hypotension medicated - I have consented for RHC, preferentially would recommend it be done by a AHF physician, we are reaching out for them either to see or perform this procedure   Atrial Fibrillation PVCs with NSVT Baseline RBBB - New diagnosis. Telemetry shows atrial fibrillation with PVCs -transition eliquis to heparin for procedure.   Chronic Kidney Disease (CKD) Stage 3B Baseline creatinine of 1.5. -Monitor kidney function during hospital stay, Presently creatinine is rising and I suspect we are unmasking CKD vs issues above   Obstructive Sleep Apnea (OSA) - On CPAP.  Chronic pain - home medications  Full Code NPO at midnight  For questions or updates, please contact [facility] Please consult [email or web address] for contact info under Cardiology/STEMI.      [name], MD FASE [facility] Cardiologist [facility]  [address] [place], [address] [place], [place] [address] [phone]  9:38 AM

## 2023-01-12 NOTE — Progress Notes (Signed)
PHARMACY - ANTICOAGULATION CONSULT NOTE  Pharmacy Consult for heparin infusion Indication: atrial fibrillation  Allergies  Allergen Reactions   Ace Inhibitors Cough   Isosorb Dinitrate-Hydralazine Other (See Comments)    REACTION: dizziness/hypotensive    Patient Measurements: Weight: 93.1 kg (205 lb 4 oz) Heparin dosing weight: 96.3 kg  Vital Signs: Temp: 99 F (37.2 C) (10/30 0950) Temp Source: Oral (10/30 0950) BP: 88/62 (10/30 0955) Pulse Rate: 75 (10/30 1000)  Labs: Recent Labs    01/10/23 2124 01/11/23 0422 01/12/23 0544  HGB 10.9*  --  10.5*  HCT 34.6*  --  33.1*  PLT 135*  --  147*  CREATININE 1.49* 1.57* 1.78*    Estimated Creatinine Clearance: 37.3 mL/min (A) (by C-G formula based on SCr of 1.78 mg/dL (H)).   Medical History: Past Medical History:  Diagnosis Date   Anemia    Asthma    Hx of childhood asthma, disappeared for a while, then resurfaced 6-7 years ago.    Cardiomyopathy    with a negative cardiac catheterization in the past. 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     Medications:  Medications Prior to Admission  Medication Sig Dispense Refill Last Dose   albuterol (PROVENTIL) (2.5 MG/3ML) 0.083% nebulizer solution TAKE 3 MLS BY NEBULIZER EVERY 6 HOURS AS NEEDED FOR WHEEZING FOR SHORTNESS OF BREATH (Patient taking differently: Take 2.5 mg by nebulization every 6 (six) hours as needed for shortness of breath or wheezing.) 150 mL 3 01/10/2023   albuterol (VENTOLIN HFA) 108 (90 Base) MCG/ACT inhaler Inhale 1-2 puffs into the lungs every 6 (six) hours as needed for wheezing or shortness of breath. 18 g 5 01/10/2023   amLODipine (NORVASC) 10 MG tablet Take 1 tablet by mouth once daily 90 tablet 0 01/10/2023   apixaban (ELIQUIS) 5 MG TABS  tablet Take 1 tablet (5 mg total) by mouth 2 (two) times daily.   01/10/2023 at 1730   aspirin EC 81 MG tablet Take 81 mg by mouth daily. Swallow whole.   01/10/2023   cyanocobalamin (VITAMIN B12) 1000 MCG tablet Take 1 tablet (1,000 mcg total) by mouth daily.   01/10/2023   fluticasone-salmeterol (ADVAIR) 500-50 MCG/ACT AEPB Inhale 1 puff into the lungs in the morning and at bedtime. 60 each 6 01/10/2023   folic acid (FOLVITE) 1 MG tablet Take 1 tablet (1 mg total) by mouth daily.   01/10/2023   furosemide (LASIX) 20 MG tablet Take 1 tablet (20 mg total) by mouth daily. 30 tablet 3 01/10/2023   gabapentin (NEURONTIN) 300 MG capsule Take 1 capsule (300 mg total) by mouth at bedtime. 90 capsule 1 01/09/2023   leflunomide (ARAVA) 20 MG tablet Take 20 mg by mouth daily.   01/10/2023   metoprolol succinate (TOPROL-XL) 25 MG 24 hr tablet Take 1.5 tablets (37.5 mg total) by mouth daily. 150 tablet 1 01/10/2023   montelukast (SINGULAIR) 10 MG tablet Take 1 tablet (10 mg total) by mouth at bedtime. 90 tablet 1 01/10/2023   omeprazole (PRILOSEC) 20 MG capsule Take 1 capsule (20 mg total) by mouth daily. 90 capsule 3 01/10/2023   rosuvastatin (CRESTOR) 10 MG tablet Take 1 tablet (10 mg total) by mouth daily. 90 tablet 1 01/10/2023  sacubitril-valsartan (ENTRESTO) 97-103 MG Take 1 tablet by mouth 2 (two) times daily. (For medication assistance program) 180 tablet 3 01/10/2023   spironolactone (ALDACTONE) 25 MG tablet Take 1 tablet (25 mg total) by mouth daily. 90 tablet 1 01/10/2023   tamsulosin (FLOMAX) 0.4 MG CAPS capsule Take 1 capsule by mouth once daily 90 capsule 0 01/10/2023    Assessment: 82 yo M presents with worsening b/l LE edema x 2 weeks w/ DOE despite increasing home furosemide therapy. Found to be in AFib upon admission, new-onset. Pharmacy consulted to dose apixaban for Afib on 10/29. Pharmacy consulted to dose heparin infusion on 10/30 in anticipation for RHC on 10/31 AM.  Hgb 10.5, Plt  147 No s/sx of bleeding SCr up to 1.78 today  Goal of Therapy:  Monitor platelets by anticoagulation protocol: Yes   Plan:  Stop apixaban  Start heparin infusion at 1300 units/hr No heparin bolus given recent apixaban administration (last dose 10/29 @ 2116)  Check baseline aPTT and heparin level Check aPTT in 8 hours Monitor daily CBC, aPTT and heparin level until correlating with recent apixaban administration, and for s/sx of bleeding Plan for RHC on 10/31 AM F/u plans to transition back to apixaban post-cath Pharmacy to provide patient education on apixaban prior to discharge   Wilburn Cornelia, PharmD, BCPS Clinical Pharmacist 01/12/2023 10:20 AM   Please refer to AMION for pharmacy phone number

## 2023-01-12 NOTE — H&P (View-Only) (Signed)
Progress Note  Patient Name: Brandon [name] Date of Encounter: 01/12/2023 Primary Cardiologist: [name], MD   Subjective   Overnight I/Os not recorded Patient notes that he legs are less swollen.  Still have SOB.  Still have LE swelling.  Vital Signs    Vitals:   01/11/23 2327 01/12/23 0416 01/12/23 0700 01/12/23 0807  BP: 97/72 96/84  100/82  Pulse: 78 99    Resp:   20 20  Temp: 98.6 F (37 C) 98.7 F (37.1 C)  98.1 F (36.7 C)  TempSrc: Oral Oral  Oral  SpO2: 98% 98%  95%  Weight:  93.1 kg      Intake/Output Summary (Last 24 hours) at 01/12/2023 0938 Last data filed at 01/12/2023 0600 Gross per 24 hour  Intake 730 ml  Output 650 ml  Net 80 ml   Filed Weights   01/11/23 0409 01/12/23 0416  Weight: 97.3 kg 93.1 kg    Physical Exam   GEN: No acute distress.   Neck: JVD Cardiac: iRRR, no  rubs, or gallops. Systolic murmur Respiratory: Decreased breath sounds GI: Soft, nontender, non-distended  MS: bilateral edema worse in left leg, skin is arm  Labs   Telemetry: atrial fibrillation with PVCs but without true NSVT   Chemistry Recent Labs  Lab 01/10/23 2124 01/11/23 0422 01/12/23 0544  NA 139 141 142  K 4.2 4.0 3.7  CL 108 108 106  CO2 20* 25 26  GLUCOSE 86 83 79  BUN 13 14 14   CREATININE 1.49* 1.57* 1.78*  CALCIUM 8.9 9.1 9.3  PROT 5.9*  --   --   ALBUMIN 3.2*  --   --   AST 11*  --   --   ALT 8  --   --   ALKPHOS 55  --   --   BILITOT 0.8  --   --   GFRNONAA 47* 44* 38*  ANIONGAP 11 8 10      Hematology Recent Labs  Lab 01/10/23 2124 01/12/23 0544  WBC 5.5 5.9  RBC 3.51* 3.49*  HGB 10.9* 10.5*  HCT 34.6* 33.1*  MCV 98.6 94.8  MCH 31.1 30.1  MCHC 31.5 31.7  RDW 14.4 14.0  PLT 135* 147*    Cardiac EnzymesNo results for input(s): "TROPONINI" in the last 168 hours. No results for input(s): "TROPIPOC" in the last 168 hours.   BNPNo results for input(s): "BNP", "PROBNP" in the last 168 hours.   DDimer No results for  input(s): "DDIMER" in the last 168 hours.   Cardiac Studies   Cardiac Studies & Procedures       ECHOCARDIOGRAM  ECHOCARDIOGRAM COMPLETE 01/11/2023  Narrative ECHOCARDIOGRAM REPORT    Patient Name:   Brandon [name] Date of Exam: 01/11/2023 Medical Rec #:  [id number]       Height:       71.0 in Accession #:    [id number]      Weight:       214.5 lb Date of Birth:  [birth year]      BSA:          2.172 m Patient Age:    82 years        BP:           107/76 mmHg Patient Gender: M               HR:           74 bpm. Exam Location:  Inpatient  Procedure: 2D  Echo, Cardiac Doppler and Color Doppler  Indications:    CHF-Acute Systolic I50.21  History:        Patient has prior history of Echocardiogram examinations, most recent 04/18/2022. CHF, Stroke; Risk Factors:Dyslipidemia, Sleep Apnea and Hypertension.  Sonographer:    Dondra Prader RVT RCS Referring Phys: [id number] KATHRYN M LAWRENCE  IMPRESSIONS   1. Left ventricular ejection fraction, by estimation, is 20 to 25%. The left ventricle has severely decreased function. The left ventricle demonstrates global hypokinesis. The left ventricular internal cavity size was severely dilated. Left ventricular diastolic function could not be evaluated. 2. Right ventricular systolic function is normal. The right ventricular size is moderately enlarged. There is moderately elevated pulmonary artery systolic pressure. The estimated right ventricular systolic pressure is 48.4 mmHg. 3. Left atrial size was severely dilated. 4. Right atrial size was moderately dilated. 5. The mitral valve is normal in structure. Moderate to severe mitral valve regurgitation. No evidence of mitral stenosis. 6. The aortic valve is tricuspid. Aortic valve regurgitation is trivial. Aortic valve sclerosis/calcification is present, without any evidence of aortic stenosis. Aortic valve area, by VTI measures 1.80 cm. Aortic valve mean gradient measures 2.2 mmHg. Aortic valve  Vmax measures 1.06 m/s. 7. The inferior vena cava is normal in size with <50% respiratory variability, suggesting right atrial pressure of 8 mmHg.  FINDINGS Left Ventricle: Left ventricular ejection fraction, by estimation, is 20 to 25%. The left ventricle has severely decreased function. The left ventricle demonstrates global hypokinesis. The left ventricular internal cavity size was severely dilated. There is no left ventricular hypertrophy. Left ventricular diastolic function could not be evaluated.  Right Ventricle: The right ventricular size is moderately enlarged. No increase in right ventricular wall thickness. Right ventricular systolic function is normal. There is moderately elevated pulmonary artery systolic pressure. The tricuspid regurgitant velocity is 3.18 m/s, and with an assumed right atrial pressure of 8 mmHg, the estimated right ventricular systolic pressure is 48.4 mmHg.  Left Atrium: Left atrial size was severely dilated.  Right Atrium: Right atrial size was moderately dilated.  Pericardium: There is no evidence of pericardial effusion.  Mitral Valve: The mitral valve is normal in structure. Moderate to severe mitral valve regurgitation. No evidence of mitral valve stenosis.  Tricuspid Valve: The tricuspid valve is normal in structure. Tricuspid valve regurgitation is trivial. No evidence of tricuspid stenosis.  Aortic Valve: The aortic valve is tricuspid. Aortic valve regurgitation is trivial. Aortic valve sclerosis/calcification is present, without any evidence of aortic stenosis. Aortic valve mean gradient measures 2.2 mmHg. Aortic valve peak gradient measures 4.5 mmHg. Aortic valve area, by VTI measures 1.80 cm.  Pulmonic Valve: The pulmonic valve was normal in structure. Pulmonic valve regurgitation is not visualized. No evidence of pulmonic stenosis.  Aorta: The aortic root is normal in size and structure.  Venous: The inferior vena cava is normal in size with  less than 50% respiratory variability, suggesting right atrial pressure of 8 mmHg.  IAS/Shunts: No atrial level shunt detected by color flow Doppler.   LEFT VENTRICLE PLAX 2D LVIDd:         6.80 cm   Diastology LVIDs:         5.70 cm   LV e' medial:    6.73 cm/s LV PW:         0.90 cm   LV E/e' medial:  12.5 LV IVS:        1.10 cm   LV e' lateral:   7.70 cm/s LVOT diam:  2.20 cm   LV E/e' lateral: 10.9 LV SV:         33 LV SV Index:   15 LVOT Area:     3.80 cm  3D Volume EF: 3D EF:        23 % LV EDV:       263 ml LV ESV:       202 ml LV SV:        61 ml  RIGHT VENTRICLE            IVC RV Basal diam:  4.70 cm    IVC diam: 1.80 cm RV S prime:     9.58 cm/s TAPSE (M-mode): 1.9 cm  LEFT ATRIUM              Index        RIGHT ATRIUM           Index LA diam:        5.00 cm  2.30 cm/m   RA Area:     25.90 cm LA Vol (A2C):   170.0 ml 78.26 ml/m  RA Volume:   85.20 ml  39.22 ml/m LA Vol (A4C):   90.8 ml  41.80 ml/m LA Biplane Vol: 131.0 ml 60.31 ml/m AORTIC VALVE                    PULMONIC VALVE AV Area (Vmax):    2.66 cm     PV Vmax:       0.84 m/s AV Area (Vmean):   2.56 cm     PV Peak grad:  2.8 mmHg AV Area (VTI):     1.80 cm AV Vmax:           105.70 cm/s AV Vmean:          69.200 cm/s AV VTI:            0.186 m AV Peak Grad:      4.5 mmHg AV Mean Grad:      2.2 mmHg LVOT Vmax:         74.03 cm/s LVOT Vmean:        46.675 cm/s LVOT VTI:          0.088 m LVOT/AV VTI ratio: 0.47  AORTA Ao Root diam: 3.10 cm  MITRAL VALVE               TRICUSPID VALVE MV Area (PHT): 6.32 cm    TR Peak grad:   40.4 mmHg MV Decel Time: 120 msec    TR Vmax:        318.00 cm/s MV E velocity: 84.30 cm/s SHUNTS Systemic VTI:  0.09 m Systemic Diam: 2.20 cm  Armanda Magic MD Electronically signed by Armanda Magic MD Signature Date/Time: 01/11/2023/11:20:06 AM    Final    MONITORS  LONG TERM MONITOR-LIVE TELEMETRY (3-14 DAYS) 05/18/2022  Narrative Predominant rhythm  is normal sinus Eight runs of tachycardia rauns occurred.  The longest run was 15 beats. Frequent runs of SVT were noted.  These were at times sutained with the longest run being 1 hour and 21 minutes.  Rate was 135. Frequent isolated SVEs Rare ventricular ectopy.             Assessment & Plan   Acute on Chronic Biventricular Heart Failure HTN -Continue Lasix to 60mg  IV BID. - I am holding his GDMT for hypotension - I am holding his MRA for AKI - original with metoprolol, NSVT burden  improved; I have now stopped this for rising creatinine - TSH WNL - Mg 1.7 will replete - DDX of AKI include SCAI B Cardiogenic shock vs Cardiorenal etiology of unmasking CKD while getting closer to euvolemia vs hypotension medicated - I have consented for RHC, preferentially would recommend it be done by a AHF physician, we are reaching out for them either to see or perform this procedure   Atrial Fibrillation PVCs with NSVT Baseline RBBB - New diagnosis. Telemetry shows atrial fibrillation with PVCs -transition eliquis to heparin for procedure.   Chronic Kidney Disease (CKD) Stage 3B Baseline creatinine of 1.5. -Monitor kidney function during hospital stay, Presently creatinine is rising and I suspect we are unmasking CKD vs issues above   Obstructive Sleep Apnea (OSA) - On CPAP.  Chronic pain - home medications  Full Code NPO at midnight  For questions or updates, please contact [facility] Please consult [email or web address] for contact info under Cardiology/STEMI.      [name], MD FASE [facility] Cardiologist [facility]  [address] [place], [address] [place], [place] [address] [phone]  9:38 AM

## 2023-01-12 NOTE — Plan of Care (Signed)

## 2023-01-12 NOTE — Progress Notes (Signed)
Mobility Specialist Progress Note:   01/12/23 1126  Mobility  Activity Ambulated independently in hallway  Level of Assistance Independent after set-up  Assistive Device None  Distance Ambulated (ft) 425 ft  Activity Response Tolerated well  Mobility Referral Yes  $Mobility charge 1 Mobility  Mobility Specialist Start Time (ACUTE ONLY) 1025  Mobility Specialist Stop Time (ACUTE ONLY) 1031  Mobility Specialist Time Calculation (min) (ACUTE ONLY) 6 min   Pre Mobility: 74 HR , 103/67 BP During Mobility: 96 HR  Post Mobility: 91 HR   Pt received in bed, agreeable to mobility. Pt denied any discomfort during ambulation, asymptomatic throughout. Pt returned bed with call bell in reach and all needs met.  Leory Plowman  Mobility Specialist Please contact via [employer] office at [phone]

## 2023-01-12 NOTE — TOC Progression Note (Signed)
Transition of Care [facility]) - Progression Note    Patient Details  Name: Brandon [name] MRN: [id number] Date of Birth: 10-Sep-1940  Transition of Care [facility]) CM/SW Contact  Eduard Roux, [place] Phone Number: 01/12/2023, 1:53 PM  Clinical Narrative:       Transition of Care [facility]) Screening Note   Patient Details  Name: Brandon [name] Date of Birth: 03-04-1941   Transition of Care [facility]) CM/SW Contact:    Eduard Roux, LCSW Phone Number: 01/12/2023, 1:53 PM    Transition of Care Department [facility]) has reviewed patient and no TOC needs have been identified at this time. We will continue to monitor patient advancement through interdisciplinary progression rounds. If new patient transition needs arise, please place a TOC consult.  Antony Blackbird, MSW, LCSW Clinical Social Worker        Expected Discharge Plan and Services                                               Social Determinants of Health (SDOH) Interventions SDOH Screenings   Food Insecurity: No Food Insecurity (04/19/2022)  Housing: Low Risk  (04/19/2022)  Transportation Needs: No Transportation Needs (04/20/2022)  Utilities: Not At Risk (04/19/2022)  Alcohol Screen: Low Risk  (07/01/2022)  Depression (PHQ2-9): Low Risk  (09/21/2022)  Financial Resource Strain: Medium Risk (03/19/2021)  Physical Activity: Insufficiently Active (07/25/2020)  Social Connections: Socially Integrated (05/28/2021)  Stress: No Stress Concern Present (05/28/2021)  Tobacco Use: Medium Risk (01/10/2023)    Readmission Risk Interventions     No data to display

## 2023-01-12 NOTE — Progress Notes (Signed)
PHARMACY - ANTICOAGULATION CONSULT NOTE  Pharmacy Consult for heparin infusion Indication: atrial fibrillation  Allergies  Allergen Reactions   Ace Inhibitors Cough   Isosorb Dinitrate-Hydralazine Other (See Comments)    REACTION: dizziness/hypotensive    Patient Measurements: Weight: 93.1 kg (205 lb 4 oz) Heparin dosing weight: 96.3 kg  Vital Signs: Temp: 98.2 F (36.8 C) (10/30 1948) Temp Source: Oral (10/30 1948) BP: 96/67 (10/30 1948) Pulse Rate: 79 (10/30 2024)  Labs: Recent Labs    01/10/23 2124 01/11/23 0422 01/12/23 0544 01/12/23 1210 01/12/23 2002  HGB 10.9*  --  10.5*  --   --   HCT 34.6*  --  33.1*  --   --   PLT 135*  --  147*  --   --   APTT  --   --   --  36 65*  HEPARINUNFRC  --   --   --  0.49  --   CREATININE 1.49* 1.57* 1.78*  --   --     Estimated Creatinine Clearance: 37.3 mL/min (A) (by C-G formula based on SCr of 1.78 mg/dL (H)).   Medical History: Past Medical History:  Diagnosis Date   Anemia    Asthma    Hx of childhood asthma, disappeared for a while, then resurfaced 6-7 years ago.    Cardiomyopathy    with a negative cardiac catheterization in the past. 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     Medications:  Medications Prior to Admission  Medication Sig Dispense Refill Last Dose   albuterol (PROVENTIL) (2.5 MG/3ML) 0.083% nebulizer solution TAKE 3 MLS BY NEBULIZER EVERY 6 HOURS AS NEEDED FOR WHEEZING FOR SHORTNESS OF BREATH (Patient taking differently: Take 2.5 mg by nebulization every 6 (six) hours as needed for shortness of breath or wheezing.) 150 mL 3 01/10/2023   albuterol (VENTOLIN HFA) 108 (90 Base) MCG/ACT inhaler Inhale 1-2 puffs into the lungs every 6 (six) hours as needed for wheezing or shortness of breath. 18 g  5 01/10/2023   amLODipine (NORVASC) 10 MG tablet Take 1 tablet by mouth once daily 90 tablet 0 01/10/2023   apixaban (ELIQUIS) 5 MG TABS tablet Take 1 tablet (5 mg total) by mouth 2 (two) times daily.   01/10/2023 at 1730   aspirin EC 81 MG tablet Take 81 mg by mouth daily. Swallow whole.   01/10/2023   cyanocobalamin (VITAMIN B12) 1000 MCG tablet Take 1 tablet (1,000 mcg total) by mouth daily.   01/10/2023   fluticasone-salmeterol (ADVAIR) 500-50 MCG/ACT AEPB Inhale 1 puff into the lungs in the morning and at bedtime. 60 each 6 01/10/2023   folic acid (FOLVITE) 1 MG tablet Take 1 tablet (1 mg total) by mouth daily.   01/10/2023   furosemide (LASIX) 20 MG tablet Take 1 tablet (20 mg total) by mouth daily. 30 tablet 3 01/10/2023   gabapentin (NEURONTIN) 300 MG capsule Take 1 capsule (300 mg total) by mouth at bedtime. 90 capsule 1 01/09/2023   leflunomide (ARAVA) 20 MG tablet Take 20 mg by mouth daily.   01/10/2023   metoprolol succinate (TOPROL-XL) 25 MG 24 hr tablet Take 1.5 tablets (37.5 mg total) by mouth daily. 150 tablet 1 01/10/2023   montelukast (SINGULAIR) 10 MG tablet  Take 1 tablet (10 mg total) by mouth at bedtime. 90 tablet 1 01/10/2023   omeprazole (PRILOSEC) 20 MG capsule Take 1 capsule (20 mg total) by mouth daily. 90 capsule 3 01/10/2023   rosuvastatin (CRESTOR) 10 MG tablet Take 1 tablet (10 mg total) by mouth daily. 90 tablet 1 01/10/2023   sacubitril-valsartan (ENTRESTO) 97-103 MG Take 1 tablet by mouth 2 (two) times daily. (For medication assistance program) 180 tablet 3 01/10/2023   spironolactone (ALDACTONE) 25 MG tablet Take 1 tablet (25 mg total) by mouth daily. 90 tablet 1 01/10/2023   tamsulosin (FLOMAX) 0.4 MG CAPS capsule Take 1 capsule by mouth once daily 90 capsule 0 01/10/2023    Assessment: 82 yo M presents with worsening b/l LE edema x 2 weeks w/ DOE despite increasing home furosemide therapy. Found to be in AFib upon admission, new-onset. Pharmacy consulted to  dose apixaban for Afib on 10/29. Pharmacy consulted to dose heparin infusion on 10/30 in anticipation for RHC on 10/31 AM.  10/30 PM: aPTT 65, just slightly subtherapeutic on heparin 1300 units/hr. No issues with the infusion or bleeding reported per RN.  Goal of Therapy:  Monitor platelets by anticoagulation protocol: Yes aPTT 66-102 sec Anti Xa level 0.3-0.7   Plan:  Increase heparin infusion to 1400 units/hr Check aPTT and HL in 8 hours - ok to do with AM labs Monitor daily CBC, aPTT and heparin level until correlating with recent apixaban administration, and for s/sx of bleeding Plan for RHC on 10/31 AM F/u plans to transition back to apixaban post-cath Pharmacy to provide patient education on apixaban prior to discharge   Loralee Pacas, PharmD, BCPS 01/12/2023 8:37 PM   Please check AMION for all [facility] Pharmacy phone numbers After 10:00 PM, call Main Pharmacy [phone]

## 2023-01-12 NOTE — Progress Notes (Addendum)
Heart Failure Stewardship Pharmacist Progress Note   PCP: [name], NP PCP-Cardiologist: [name], MD    HPI:  [id number] initially presenting to outpatient cardiology with worsened LE edema x2 weeks, mild DOE. Compliant to medications - did not have increased UOP with incr dose of lasix to 40 mg daily x3 days. New afib on EKG. Admitted for IV diuresis, possible TEE guided DCCV. 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  Echo this admission demonstrates worsened EF to 20-25%, severely decreased LV function, global hypokinesis, dilated LV, RV normal - moderately enlarged, mod-severe MVR. CXR 10/29 with minimal bibasilar atelectasis with small pleural effusions. Planning for RHC 10/31 with AHF per cardiology.   Pt reports compliance to medication regimen at home- reports that he has a good system to keep track of his medications. Denies dietary excursions, other than recently eating [place] food. Was able to ambulate today with his walker and denied ShOB. Reports good response to IV loop diuretic. Monitors his BP at home - usually sees low 120s/70-80s. Denied any dizziness or lightheadedness when ambulating/standing up.   Current HF Medications: Diuretic: furosemide 60 mg IV BID Beta Blocker: metoprolol succinate 37.5 mg PO BID (d/c 10/30) ACE/ARB/ARNI: Entresto (sacubitril/valsartan) 97-103 mg PO BID (d/c 10/30) MRA: spironolactone 25 mg PO daily (d/c 10/30)  Prior to admission HF Medications: Diuretic: furosemide 20 mg PO daily (increased to 40 mg daily x3 days) Beta blocker: metoprolol succinate 37.5 mg PO daily ACE/ARB/ARNI: Entresto 97-103 mg PO BID MRA: spironolactone 25 mg PO daily  HTN PTA: amlodipine 10 mg PO daily  Dispense hx OK.  Pertinent Lab Values: Serum creatinine 1.78 (BL 1.4-1.5), eGFR 38, CrCl 37,  BUN 14, Potassium 3.7, Sodium 142, BNP not drawn, Magnesium 1.7  Vital Signs: Weight: 205.25 lbs  (admission weight: 214.51 lbs) Blood pressure: 90-100s/70-80s mmHg  Heart rate: 80-90s (afib)  I/O: net -1.8 L yesterday; net -2.2L since admission O2 96% on RA   Medication Assistance / Insurance Benefits Check: Does the patient have prescription insurance?  Yes Type of insurance plan: [facility] Medicare   Test claims (pt in the donut hole) - Eliquis: $149.53/30ds  Does the patient qualify for medication assistance through manufacturers or grants?   Yes Medication assistance applications approved: [employer] Bucker (approved through Apr 2025) - currently using for Entresto, can be used for SGLT2i.  Outpatient Pharmacy:  Prior to admission outpatient pharmacy: [facility], [facility], [address] Is the patient willing to use [facility] TOC pharmacy at discharge? Yes Is the patient willing to transition their outpatient pharmacy to utilize a [facility] outpatient pharmacy?   No    Assessment: 1. 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 NYHA class III-IV symptoms. - Minimal LE edema on exam, L > R. Scr is trending up. Appropriate to monitor and continue IV diuresis.  - Was tolerating BB, MRA, and ARNI outpatient. Now having some hypotension inpatient - GDMT held by cardiology with c/f hypotension mediated AKI vs cardiogenic shock. Pt without s/sx of hypotension upon standing/ambulation.  - Pt is a good candidate to initiate SGLT2i when stable, especially if continues to be hypotensive. - Planning for RHC to r/o cardiogenic shock > will continue to monitor results to guide escalation of GDMT. Of note, has dizziness/hypotension documented with isosorbide/hydralazine in the past.    Plan: 1) Medication changes recommended  at this time: - Continue furosemide 60 mg IV BID - If Scr stable tomorrow, consider initiating SGLT2i  - Continue to monitor BP and s/sx of cardiogenic shock to guide  re-initiation of home GDMT including MRA, ARNI, BB  2) Patient assistance: - Patient enrolled in [facility] Cardiomyopathy Grant - covers [place], SGLT2i, and generic HF medications - Pt unlikely to qualify for Eliquis manufacturer PAP, and not covered by [employer].  3)  Education  - Educated patient on general disease state and pathophysiology of HF. Pt aware of the importance of adherence to medications and lifestyle interventions to prevent future HF exacerbations. Pt aware the medications will be adjusted while admitted and likely will continue to be adjusted at outpatient f/u.   Nils Pyle, PharmD PGY1 Pharmacy Resident

## 2023-01-12 NOTE — Telephone Encounter (Signed)
Patient said that he is with Robet Leu, PA now inpatient and she is addressing the questions he had regarding his heart cath procedure tomorrow. No further questions for me at this time,.

## 2023-01-13 ENCOUNTER — Encounter (HOSPITAL_COMMUNITY): Payer: Self-pay | Admitting: Cardiology

## 2023-01-13 ENCOUNTER — Encounter (HOSPITAL_COMMUNITY)
Admission: AD | Disposition: A | Discharge status: Home / Self Care | Payer: Self-pay | Source: Ambulatory Visit | Attending: Cardiovascular Disease

## 2023-01-13 DIAGNOSIS — I4891 Unspecified atrial fibrillation: Secondary | ICD-10-CM | POA: Diagnosis not present

## 2023-01-13 DIAGNOSIS — I509 Heart failure, unspecified: Secondary | ICD-10-CM | POA: Diagnosis not present

## 2023-01-13 DIAGNOSIS — I5023 Acute on chronic systolic (congestive) heart failure: Secondary | ICD-10-CM | POA: Diagnosis not present

## 2023-01-13 HISTORY — PX: RIGHT HEART CATH: CATH118263

## 2023-01-13 LAB — POCT I-STAT 7, (LYTES, BLD GAS, ICA,H+H)
Acid-Base Excess: 3 mmol/L — ABNORMAL HIGH (ref 0.0–2.0)
Acid-Base Excess: 3 mmol/L — ABNORMAL HIGH (ref 0.0–2.0)
Bicarbonate: 27.8 mmol/L (ref 20.0–28.0)
Bicarbonate: 28 mmol/L (ref 20.0–28.0)
Calcium, Ion: 1.17 mmol/L (ref 1.15–1.40)
Calcium, Ion: 1.17 mmol/L (ref 1.15–1.40)
HCT: 33 % — ABNORMAL LOW (ref 39.0–52.0)
HCT: 33 % — ABNORMAL LOW (ref 39.0–52.0)
Hemoglobin: 11.2 g/dL — ABNORMAL LOW (ref 13.0–17.0)
Hemoglobin: 11.2 g/dL — ABNORMAL LOW (ref 13.0–17.0)
O2 Saturation: 57 %
O2 Saturation: 58 %
Potassium: 4.1 mmol/L (ref 3.5–5.1)
Potassium: 4.1 mmol/L (ref 3.5–5.1)
Sodium: 139 mmol/L (ref 135–145)
Sodium: 139 mmol/L (ref 135–145)
TCO2: 29 mmol/L (ref 22–32)
TCO2: 29 mmol/L (ref 22–32)
pCO2 arterial: 44.6 mm[Hg] (ref 32–48)
pCO2 arterial: 45.6 mm[Hg] (ref 32–48)
pH, Arterial: 7.403 (ref 7.35–7.45)
pH, Arterial: 7.397 (ref 7.35–7.45)
pO2, Arterial: 30 mm[Hg] — CL (ref 83–108)
pO2, Arterial: 31 mm[Hg] — CL (ref 83–108)

## 2023-01-13 LAB — BASIC METABOLIC PANEL
Anion gap: 10 (ref 5–15)
BUN: 18 mg/dL (ref 8–23)
CO2: 26 mmol/L (ref 22–32)
Calcium: 9 mg/dL (ref 8.9–10.3)
Chloride: 102 mmol/L (ref 98–111)
Creatinine, Ser: 1.82 mg/dL — ABNORMAL HIGH (ref 0.61–1.24)
GFR, Estimated: 37 mL/min — ABNORMAL LOW (ref >60)
Glucose, Bld: 87 mg/dL (ref 70–99)
Potassium: 3.4 mmol/L — ABNORMAL LOW (ref 3.5–5.1)
Sodium: 138 mmol/L (ref 135–145)

## 2023-01-13 LAB — CBC
HCT: 33.5 % — ABNORMAL LOW (ref 39.0–52.0)
Hemoglobin: 10.9 g/dL — ABNORMAL LOW (ref 13.0–17.0)
MCH: 30.8 pg (ref 26.0–34.0)
MCHC: 32.5 g/dL (ref 30.0–36.0)
MCV: 94.6 fL (ref 80.0–100.0)
Platelets: 144 10*3/uL — ABNORMAL LOW (ref 150–400)
RBC: 3.54 MIL/uL — ABNORMAL LOW (ref 4.22–5.81)
RDW: 13.9 % (ref 11.5–15.5)
WBC: 5.2 10*3/uL (ref 4.0–10.5)
nRBC: 0 % (ref 0.0–0.2)

## 2023-01-13 LAB — APTT: aPTT: 124 s — ABNORMAL HIGH (ref 24–36)

## 2023-01-13 LAB — HEPARIN LEVEL (UNFRACTIONATED): Heparin Unfractionated: 0.57 [IU]/mL (ref 0.30–0.70)

## 2023-01-13 SURGERY — RIGHT HEART CATH
Anesthesia: LOCAL

## 2023-01-13 MED ORDER — HEPARIN (PORCINE) IN NACL 1000-0.9 UT/500ML-% IV SOLN
INTRAVENOUS | Status: DC | PRN
  Administered 2023-01-13: 500 mL

## 2023-01-13 MED ORDER — ASPIRIN 81 MG PO CHEW
81.0000 mg | CHEWABLE_TABLET | ORAL | Status: AC
  Administered 2023-01-13: 81 mg via ORAL
  Filled 2023-01-13: qty 1

## 2023-01-13 MED ORDER — POTASSIUM CHLORIDE CRYS ER 20 MEQ PO TBCR
20.0000 meq | EXTENDED_RELEASE_TABLET | Freq: Once | ORAL | Status: AC
  Administered 2023-01-13: 20 meq via ORAL
  Filled 2023-01-13: qty 1

## 2023-01-13 MED ORDER — APIXABAN 2.5 MG PO TABS
2.5000 mg | ORAL_TABLET | Freq: Two times a day (BID) | ORAL | Status: DC
  Administered 2023-01-13 – 2023-01-15 (×5): 2.5 mg via ORAL
  Filled 2023-01-13 (×5): qty 1

## 2023-01-13 MED ORDER — POTASSIUM CHLORIDE 20 MEQ PO PACK
40.0000 meq | PACK | Freq: Once | ORAL | Status: AC
  Administered 2023-01-13: 40 meq via ORAL
  Filled 2023-01-13: qty 2

## 2023-01-13 MED ORDER — SODIUM CHLORIDE 0.9 % IV SOLN: INTRAVENOUS | Status: DC

## 2023-01-13 MED ORDER — TAMSULOSIN HCL 0.4 MG PO CAPS
0.4000 mg | ORAL_CAPSULE | Freq: Every day | ORAL | Status: DC
  Administered 2023-01-13 – 2023-01-15 (×3): 0.4 mg via ORAL
  Filled 2023-01-13 (×3): qty 1

## 2023-01-13 MED ORDER — METOPROLOL SUCCINATE ER 25 MG PO TB24
25.0000 mg | ORAL_TABLET | Freq: Every day | ORAL | Status: DC
  Administered 2023-01-13 – 2023-01-15 (×3): 25 mg via ORAL
  Filled 2023-01-13 (×3): qty 1

## 2023-01-13 MED ORDER — LIDOCAINE HCL (PF) 1 % IJ SOLN
INTRAMUSCULAR | Status: AC
  Filled 2023-01-13: qty 30

## 2023-01-13 MED ORDER — LIDOCAINE HCL (PF) 1 % IJ SOLN
INTRAMUSCULAR | Status: DC | PRN
  Administered 2023-01-13: 2 mL via INTRADERMAL

## 2023-01-13 SURGICAL SUPPLY — 4 items
CATH SWAN GANZ 7F STRAIGHT (CATHETERS) IMPLANT
GLIDESHEATH SLENDER 7FR .021G (SHEATH) IMPLANT
GUIDEWIRE .025 260CM (WIRE) IMPLANT
PACK CARDIAC CATHETERIZATION (CUSTOM PROCEDURE TRAY) ×1 IMPLANT

## 2023-01-13 NOTE — Progress Notes (Signed)
PHARMACY - ANTICOAGULATION CONSULT NOTE  Pharmacy Consult for heparin infusion Indication: atrial fibrillation  Allergies  Allergen Reactions   Ace Inhibitors Cough   Isosorb Dinitrate-Hydralazine Other (See Comments)    REACTION: dizziness/hypotensive    Patient Measurements: Weight: 93.1 kg (205 lb 4 oz) Heparin dosing weight: 96.3 kg  Vital Signs: Temp: 97.6 F (36.4 C) (10/31 0410) Temp Source: Oral (10/31 0410) BP: 97/73 (10/31 0410) Pulse Rate: 89 (10/31 0410)  Labs: Recent Labs    01/10/23 2124 01/11/23 0422 01/12/23 0544 01/12/23 1210 01/12/23 2002 01/13/23 0420  HGB 10.9*  --  10.5*  --   --  10.9*  HCT 34.6*  --  33.1*  --   --  33.5*  PLT 135*  --  147*  --   --  144*  APTT  --   --   --  36 65* 124*  HEPARINUNFRC  --   --   --  0.49  --  0.57  CREATININE 1.49* 1.57* 1.78*  --   --  1.82*    Estimated Creatinine Clearance: 36.5 mL/min (A) (by C-G formula based on SCr of 1.82 mg/dL (H)).   Medical History: Past Medical History:  Diagnosis Date   Anemia    Asthma    Hx of childhood asthma, disappeared for a while, then resurfaced 6-7 years ago.    Cardiomyopathy    with a negative cardiac catheterization in the past. 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     Medications:  Medications Prior to Admission  Medication Sig Dispense Refill Last Dose   albuterol (PROVENTIL) (2.5 MG/3ML) 0.083% nebulizer solution TAKE 3 MLS BY NEBULIZER EVERY 6 HOURS AS NEEDED FOR WHEEZING FOR SHORTNESS OF BREATH (Patient taking differently: Take 2.5 mg by nebulization every 6 (six) hours as needed for shortness of breath or wheezing.) 150 mL 3 01/10/2023   albuterol (VENTOLIN HFA) 108 (90 Base) MCG/ACT inhaler Inhale 1-2 puffs into the lungs every 6 (six) hours as  needed for wheezing or shortness of breath. 18 g 5 01/10/2023   amLODipine (NORVASC) 10 MG tablet Take 1 tablet by mouth once daily 90 tablet 0 01/10/2023   apixaban (ELIQUIS) 5 MG TABS tablet Take 1 tablet (5 mg total) by mouth 2 (two) times daily.   01/10/2023 at 1730   aspirin EC 81 MG tablet Take 81 mg by mouth daily. Swallow whole.   01/10/2023   cyanocobalamin (VITAMIN B12) 1000 MCG tablet Take 1 tablet (1,000 mcg total) by mouth daily.   01/10/2023   fluticasone-salmeterol (ADVAIR) 500-50 MCG/ACT AEPB Inhale 1 puff into the lungs in the morning and at bedtime. 60 each 6 01/10/2023   folic acid (FOLVITE) 1 MG tablet Take 1 tablet (1 mg total) by mouth daily.   01/10/2023   furosemide (LASIX) 20 MG tablet Take 1 tablet (20 mg total) by mouth daily. 30 tablet 3 01/10/2023   gabapentin (NEURONTIN) 300 MG capsule Take 1 capsule (300 mg total) by mouth at bedtime. 90 capsule 1 01/09/2023   leflunomide (ARAVA) 20 MG tablet Take 20 mg by mouth daily.   01/10/2023   metoprolol succinate (TOPROL-XL) 25 MG 24 hr tablet Take 1.5 tablets (37.5 mg total) by mouth daily. 150 tablet 1  01/10/2023   montelukast (SINGULAIR) 10 MG tablet Take 1 tablet (10 mg total) by mouth at bedtime. 90 tablet 1 01/10/2023   omeprazole (PRILOSEC) 20 MG capsule Take 1 capsule (20 mg total) by mouth daily. 90 capsule 3 01/10/2023   rosuvastatin (CRESTOR) 10 MG tablet Take 1 tablet (10 mg total) by mouth daily. 90 tablet 1 01/10/2023   sacubitril-valsartan (ENTRESTO) 97-103 MG Take 1 tablet by mouth 2 (two) times daily. (For medication assistance program) 180 tablet 3 01/10/2023   spironolactone (ALDACTONE) 25 MG tablet Take 1 tablet (25 mg total) by mouth daily. 90 tablet 1 01/10/2023   tamsulosin (FLOMAX) 0.4 MG CAPS capsule Take 1 capsule by mouth once daily 90 capsule 0 01/10/2023    Assessment: 82 yo M presents with worsening b/l LE edema x 2 weeks w/ DOE despite increasing home furosemide therapy. Found to be in AFib  upon admission, new-onset. Pharmacy consulted to dose apixaban for Afib on 10/29. Pharmacy reconsulted to dose heparin infusion on 10/30 in anticipation for RHC on 10/31 AM.  aPTT above goal but heparin level therapeutic at 0.57 - odd correlation, will dose via heparin level. CBC stable, RHC planned later today.  Goal of Therapy:  Monitor platelets by anticoagulation protocol: Yes   Plan:  Continue heparin 1400 units/h F/U [facility] plans post/cath  Fredonia Highland, PharmD, BCPS, [facility] Clinical Pharmacist [phone] Please check AMION for all [facility] Pharmacy numbers 01/13/2023

## 2023-01-13 NOTE — Care Management Important Message (Signed)
Important Message  Patient Details  Name: Dinero E Swaziland MRN: [id number] Date of Birth: 10/09/1940   Important Message Given:  Yes - Medicare IM     Dorena Bodo 01/13/2023, 3:01 PM

## 2023-01-13 NOTE — Progress Notes (Signed)
Heart Failure Stewardship Pharmacist Progress Note   PCP: [name], NP PCP-Cardiologist: [name], MD    HPI:  [id number] initially presenting to outpatient cardiology with worsened LE edema x2 weeks, mild DOE. Compliant to medications - did not have increased UOP with incr dose of lasix to 40 mg daily x3 days. New afib on EKG. Admitted for IV diuresis, possible TEE guided DCCV. 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  Echo this admission demonstrates worsened EF to 20-25%, severely decreased LV function, global hypokinesis, dilated LV, RV normal - moderately enlarged, mod-severe MVR. CXR 10/29 with minimal bibasilar atelectasis with small pleural effusions. Planning for RHC 10/31 with AHF per cardiology.   Pt reports compliance to medication regimen at home- reports that he has a good system to keep track of his medications. Denies dietary excursions, other than recently eating [place] food. Was able to ambulate today with his walker and denied ShOB. Reports good response to IV loop diuretic. Monitors his BP at home - usually sees low 120s/70-80s. Denied any dizziness or lightheadedness when ambulating/standing up.   RHC 10/31 demonstrates RA mean 5, RV 40, PA mean 30, PCWP 14, Fick CO/CI 5.2/2.4, thermodilution CO/CI 4.3/2. Normal pre and post capillary filling pressures. 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 Mild to mod reduced CO/CI by thermodilution.  Current HF Medications: Beta Blocker: metoprolol succinate 25 mg PO BID  Prior to admission HF Medications: Diuretic: furosemide 20 mg PO daily (increased to 40 mg daily x3 days) Beta blocker: metoprolol succinate 37.5 mg PO daily ACE/ARB/ARNI: Entresto 97-103 mg PO BID MRA: spironolactone 25 mg PO daily  HTN PTA: amlodipine 10 mg PO daily  Dispense hx OK.  Pertinent Lab Values: Serum creatinine 1.78 > 1.82 (BL 1.4-1.5),  eGFR 37, CrCl 37,  BUN 18, Potassium 3.4, Sodium 138, BNP not drawn, Magnesium 1.7  Vital Signs: Weight: 205.25 lbs (admission weight: 214.51 lbs) - no weight 10/31 Blood pressure: 90-100s/70-80s mmHg  Heart rate: 80-90s (afib)  I/O: net -0.6 L yesterday; net -3.3L since admission O2 96% on RA   Medication Assistance / Insurance Benefits Check: Does the patient have prescription insurance?  Yes Type of insurance plan: [facility] Medicare   Test claims (pt in the donut hole) - Eliquis: $149.53/30ds  Does the patient qualify for medication assistance through manufacturers or grants?   Yes Medication assistance applications approved: [employer]omy[employer]ucker (approved through Apr 2025) - currently using for Entresto, can be used for SGLT2i.  Outpatient Pharmacy:  Prior to admission [facility] pharmacy: [facility], [facility], [address] Is the patient willing to use [facility] TOC pharmacy at discharge? Yes Is the patient willing to transition their outpatient pharmacy to utilize a [facility] outpatient pharmacy?   No    Assessment: 1. 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 NYHA class III-IV symptoms. - LE edema appears resolved on exam. Only mildly elevated PA mean and PCWP on RHC, RA normal. Scr still trending up. Agree with discontinuing IV diuresis.  - Cardiology restarted BB today given no s/sx of cardiogenic shock, though CI mildly reduced on RHC. Pt continues to have low/normal BP without s/sx of hypotension. Appropriate to continue holding ARNI and MRA given BP and AKI.  - Pt is a good candidate to initiate SGLT2i when kidney function improves.  -  Pt has reported dizziness/hypotension documented with isosorbide/hydralazine in the past.    Plan: 1) Medication changes recommended at this time: - Continue metoprolol succinate 25 mg PO daily - Consider initiating SGLT2i once Scr improves -  Continue to monitor BP and AKI to guide re-initiation of home GDMT including MRA and ARNI  2) Patient assistance: - Patient enrolled in [facility] Cardiomyopathy Grant - covers [place], SGLT2i, and generic HF medications - Pt unlikely to qualify for Eliquis manufacturer PAP, and not covered by [employer].  3)  Education  - Educated patient on general disease state and pathophysiology of HF. Pt aware of the importance of adherence to medications and lifestyle interventions to prevent future HF exacerbations. Pt aware the medications will be adjusted while admitted and likely will continue to be adjusted at outpatient f/u.   Nils Pyle, PharmD PGY1 Pharmacy Resident

## 2023-01-13 NOTE — Plan of Care (Signed)
  Problem: Education: Goal: Knowledge of General Education information will improve Description: Including pain rating scale, medication(s)/side effects and non-pharmacologic comfort measures Outcome: Progressing   Problem: Health Behavior/Discharge Planning: Goal: Ability to manage health-related needs will improve Outcome: Progressing   Problem: Clinical Measurements: Goal: Ability to maintain clinical measurements within normal limits will improve Outcome: Progressing Goal: Will remain free from infection Outcome: Progressing Goal: Diagnostic test results will improve Outcome: Progressing Goal: Respiratory complications will improve Outcome: Progressing Goal: Cardiovascular complication will be avoided Outcome: Progressing   Problem: Activity: Goal: Risk for activity intolerance will decrease Outcome: Progressing   Problem: Nutrition: Goal: Adequate nutrition will be maintained Outcome: Progressing   Problem: Coping: Goal: Level of anxiety will decrease Outcome: Progressing   Problem: Elimination: Goal: Will not experience complications related to bowel motility Outcome: Progressing Goal: Will not experience complications related to urinary retention Outcome: Progressing   Problem: Pain Management: Goal: General experience of comfort will improve Outcome: Progressing   Problem: Safety: Goal: Ability to remain free from injury will improve Outcome: Progressing   Problem: Skin Integrity: Goal: Risk for impaired skin integrity will decrease Outcome: Progressing   Problem: Education: Goal: Ability to demonstrate management of disease process will improve Outcome: Progressing Goal: Ability to verbalize understanding of medication therapies will improve Outcome: Progressing Goal: Individualized Educational Video(s) Outcome: Progressing   Problem: Activity: Goal: Capacity to carry out activities will improve Outcome: Progressing   Problem: Cardiac: Goal:  Ability to achieve and maintain adequate cardiopulmonary perfusion will improve Outcome: Progressing   Problem: Education: Goal: Understanding of CV disease, CV risk reduction, and recovery process will improve Outcome: Progressing Goal: Individualized Educational Video(s) Outcome: Progressing   Problem: Activity: Goal: Ability to return to baseline activity level will improve Outcome: Progressing   Problem: Cardiovascular: Goal: Ability to achieve and maintain adequate cardiovascular perfusion will improve Outcome: Progressing Goal: Vascular access site(s) Level 0-1 will be maintained Outcome: Progressing   Problem: Health Behavior/Discharge Planning: Goal: Ability to safely manage health-related needs after discharge will improve Outcome: Progressing   Problem: Education: Goal: Understanding of CV disease, CV risk reduction, and recovery process will improve Outcome: Progressing   Problem: Activity: Goal: Ability to return to baseline activity level will improve Outcome: Progressing

## 2023-01-13 NOTE — Progress Notes (Addendum)
Patient Name: Va E Swaziland Date of Encounter: 01/13/2023 [facility] Cardiologist: [name], MD   Interval Summary  .    Patient reports feeling well this AM. No chest pain, shortness of breath. Ankle edema resolved. Remains in afib with HR in the 80s-90s. No palpitations   Vital Signs .    Vitals:   01/13/23 0700 01/13/23 0726 01/13/23 0800 01/13/23 0900  BP:  106/69    Pulse:  90 90 84  Resp: 20 19 15  (!) 21  Temp:  98.4 F (36.9 C)    TempSrc:  Oral    SpO2:  95% 97% 100%  Weight:        Intake/Output Summary (Last 24 hours) at 01/13/2023 0918 Last data filed at 01/13/2023 0726 Gross per 24 hour  Intake 836.52 ml  Output 2150 ml  Net -1313.48 ml      01/12/2023    4:16 AM 01/11/2023    4:09 AM 01/10/2023    2:48 PM  Last 3 Weights  Weight (lbs) 205 lb 4 oz 214 lb 8.1 oz 223 lb 12.8 oz  Weight (kg) 93.1 kg 97.3 kg 101.515 kg      Telemetry/ECG    Atrial fibrillation with PVCs. HR in the 80s-90s - Personally Reviewed  Physical Exam .   GEN: No acute distress. Sitting upright in the bed   Neck: No JVD Cardiac: Irregular rate and rhythm. Faint systolic murmur. Radial pulses 2+ bilaterally   Respiratory: Clear to auscultation bilaterally. GI: Soft, nontender, non-distended  MS: No edema in BLE   Assessment & Plan .     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 Reported worsening lower extremity edema, mild dyspnea on exertion. Had increased his lasix dose to 40 mg for 3 days without improvement. Found to be in new onset atrial fibrillation  - Given extent of hypervolemia, patient was admitted to [facility] for further evaluation and IV diuresis. Echocardiogram this admission showed EF 20-25% (previously 45-50%), normal RV function, moderately elevated pulmonary artery systolic pressure, moderate-severe MR.  - He was started on IV lasix 60 mg daily- currently net -3.5 L  since admission. Patient developed AKI with creatinine rising to 1.82 today (was 1.49 on admission)  - Currently cause of AKI unclear- possible cardiogenic shock vs cardiorenal etiology. Also possible that patient worsening CKD with new baseline. - Patient is scheduled for RHC today. Lasix held  - Patient appears euvolemic on exam today  - Holding GDMT due to hypotension and AKI  Atrial Fibrillation  PACs with NSVT  Baseline RBBB - Patient was diagnosed with afib on 10/29 in clinic  - Currently on IV heparin pending RHC  - Per telemetry- patient remains in atrial fibrillation with PVCs. HR in the 80s-90s   CKD stage IIIb - Baseline creatinine around 1.5 - Now with creatinine 1.89 after diuresis - RHC today as above - lasix, GDMT held   OSA  - On CPAP   Chronic Pain  - Continue home regiment   For questions or updates, please contact [facility] Please consult [email or web address] for contact info under        Signed, Jonita Albee, PA-C    Personally seen and examined. Agree with APP above with the following comments:  Mr. Swaziland, 82 yo M with a known history of cardiomyopathy, had concerns about  his upcoming heart catheterization. Yesterday, his blood pressure  improved after discontinuing Entresto, although his creatinine levels remain abnormal.    His symptoms have improved overall, but he continues to experience leg swelling.  His atrial fibrillation rates have increased since discontinuing metoprolol, and he has had several PVCs, couplets, and triplets, but no clear nonsustained ventricular tachycardia.  Exam notable for improvement his I/Os. - 3.5 L.  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   Would recommend  Acute on chronic biventricular  failure - holding IV lasix today - SGLT2i tomorrow, if BP allows will return ARB, would be reasonable MRA candidate if creatinine stabilized (cardiorenal syndrome)  Atrial Fibrillation -Consented for TEE/cardioversion  - returning low dose metoprolol - transition back to eliquis - NPO at midnight  Urinary retention - return tamsulosin  Chronic Pain -home regimen  NPO at midnight Full Code  [name], MD FASE [facility] Cardiologist [facility]  [address] [place], [address] [place], [place] [address] [phone]  12:10 PM

## 2023-01-13 NOTE — Interval H&P Note (Signed)
History and Physical Interval Note:  01/13/2023 10:38 AM  Brandon [name]  has presented today for surgery, with the diagnosis of heart failure.  The various methods of treatment have been discussed with the patient and family. After consideration of risks, benefits and other options for treatment, the patient has consented to  Procedure(s): RIGHT HEART CATH (N/A) as a surgical intervention.  The patient's history has been reviewed, patient examined, no change in status, stable for surgery.  I have reviewed the patient's chart and labs.  Questions were answered to the patient's satisfaction.     Lenward Able

## 2023-01-13 NOTE — Progress Notes (Signed)
Heart Failure Nurse Navigator Progress Note  PCP: [name], NP PCP-Cardiologist: Hochrein Admission Diagnosis: Acute on chronic systolic heart failure Admitted from: Home  Presentation:   Brandon [name] presented with worsening lower extremity edema over the last 2 weeks. Mild dyspnea on exertion. Denied chest pain and palpitations.  ECHO/ LVEF: 20-25%  Clinical Course:  Past Medical History:  Diagnosis Date   Anemia    Asthma    Hx of childhood asthma, disappeared for a while, then resurfaced 6-7 years ago.    Cardiomyopathy    with a negative cardiac catheterization in the past. 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      Social History   Socioeconomic History   Marital status: Married    Spouse name: [name]   Number of children: 3   Years of education: Not on file   Highest education level: Bachelor's degree (e.g., BA, AB, BS)  Occupational History   Occupation: retired  Tobacco Use   Smoking status: Former   Smokeless tobacco: Never   Tobacco comments:    quit smoling in 01/22/1989. started when 18, 1 ppd  Vaping Use   Vaping status: Never Used  Substance and Sexual Activity   Alcohol use: Not Currently    Comment: occ   Drug use: Never   Sexual activity: Not on file  Other Topics Concern   Not on file  Social History Narrative   Grew up in [place], attended [employer]. First wife died of breast ca in 22-Jan-1998. 3 children. Remarried- 8 years. 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    Pt signed designated party release granting access to [facility] to his wife [name]. Detailed message may be left on home or cell phone. Roselle Locus August 13, 2009 11:34 am.    Social Determinants of Health   Financial Resource Strain: Low Risk   (01/13/2023)   Overall Financial Resource Strain (CARDIA)    Difficulty of Paying Living Expenses: Not hard at all  Food Insecurity: No Food Insecurity (04/19/2022)   Hunger Vital Sign    Worried About Running Out of Food in the Last Year: Never true    Ran Out of Food in the Last Year: Never true  Transportation Needs: No Transportation Needs (01/13/2023)   PRAPARE - Administrator, Civil Service (Medical): No    Lack of Transportation (Non-Medical): No  Physical Activity: Insufficiently Active (07/25/2020)   Exercise Vital Sign    Days of Exercise per Week: 5 days    Minutes of Exercise per Session: 10 min  Stress: No Stress Concern Present (05/28/2021)   [employer] of Occupational Health - Occupational Stress Questionnaire    Feeling of Stress : Not at all  Social Connections: Socially Integrated (05/28/2021)   Social Connection and Isolation Panel [NHANES]    Frequency of Communication with Friends and Family: More than three times a week    Frequency of Social Gatherings with Friends and Family: Three times a week    Attends Religious Services: More than 4 times per year    Active Member of Clubs or Organizations: Yes    Attends Banker Meetings: More than 4 times per year  Marital Status: Married   Water engineer and Provision:  Detailed education and instructions provided on heart failure disease management including the following:  Signs and symptoms of Heart Failure When to call the physician Importance of daily weights Low sodium diet Fluid restriction Medication management Anticipated future follow-up appointments  Patient education given on each of the above topics.  Patient acknowledges understanding via teach back method and acceptance of all instructions.  Education Materials:  "Living Better With Heart Failure" Booklet, HF zone tool, & Daily Weight Tracker Tool.  Patient has scale at home: Yes Patient has pill box at home:  Yes    High Risk Criteria for Readmission and/or Poor Patient Outcomes: Heart failure hospital admissions (last 6 months): 0  No Show rate: 7 Difficult social situation: No Demonstrates medication adherence: Yes Primary Language: English Literacy level: Reading, Writing and Comprehension  Barriers of Care:   Diet & Fluid Restrictions Daily Weights Medication Management   Considerations/Referrals:   Referral made to Heart Failure Pharmacist Stewardship: Yes Referral made to Heart Failure CSW/NCM TOC: No Referral made to Heart & Vascular TOC clinic: Yes  Items for Follow-up on DC/TOC: Diet & Fluid Restrictions Daily Weights New Medications Continued Heart Failure Education   Roxy Horseman, RN, BSN [facility] Heart Failure Navigator Secure Chat Only

## 2023-01-13 NOTE — Plan of Care (Signed)

## 2023-01-13 NOTE — Progress Notes (Signed)
   01/13/23 1134  TOC Brief Assessment  Insurance and Status Reviewed  Patient has primary care physician Yes  Home environment has been reviewed home w/ spouse  Prior level of function: independent  Prior/Current Home Services No current home services  Social Determinants of Health Reivew SDOH reviewed no interventions necessary  Readmission risk has been reviewed Yes  Transition of care needs no transition of care needs at this time

## 2023-01-14 ENCOUNTER — Inpatient Hospital Stay (HOSPITAL_COMMUNITY): Payer: Medicare Other

## 2023-01-14 ENCOUNTER — Encounter (HOSPITAL_COMMUNITY)
Admission: AD | Disposition: A | Discharge status: Home / Self Care | Payer: Self-pay | Source: Ambulatory Visit | Attending: Cardiovascular Disease

## 2023-01-14 ENCOUNTER — Inpatient Hospital Stay (HOSPITAL_COMMUNITY): Payer: Medicare Other | Admitting: Anesthesiology

## 2023-01-14 DIAGNOSIS — I4819 Other persistent atrial fibrillation: Secondary | ICD-10-CM | POA: Insufficient documentation

## 2023-01-14 DIAGNOSIS — I513 Intracardiac thrombosis, not elsewhere classified: Secondary | ICD-10-CM

## 2023-01-14 DIAGNOSIS — I5023 Acute on chronic systolic (congestive) heart failure: Secondary | ICD-10-CM | POA: Diagnosis not present

## 2023-01-14 DIAGNOSIS — I4891 Unspecified atrial fibrillation: Secondary | ICD-10-CM | POA: Diagnosis not present

## 2023-01-14 DIAGNOSIS — I340 Nonrheumatic mitral (valve) insufficiency: Secondary | ICD-10-CM | POA: Diagnosis not present

## 2023-01-14 DIAGNOSIS — N179 Acute kidney failure, unspecified: Secondary | ICD-10-CM | POA: Insufficient documentation

## 2023-01-14 HISTORY — PX: TRANSESOPHAGEAL ECHOCARDIOGRAM (CATH LAB): EP1270

## 2023-01-14 HISTORY — DX: Intracardiac thrombosis, not elsewhere classified: I51.3

## 2023-01-14 LAB — CBC
HCT: 32.3 % — ABNORMAL LOW (ref 39.0–52.0)
Hemoglobin: 10.3 g/dL — ABNORMAL LOW (ref 13.0–17.0)
MCH: 30.5 pg (ref 26.0–34.0)
MCHC: 31.9 g/dL (ref 30.0–36.0)
MCV: 95.6 fL (ref 80.0–100.0)
Platelets: 141 10*3/uL — ABNORMAL LOW (ref 150–400)
RBC: 3.38 MIL/uL — ABNORMAL LOW (ref 4.22–5.81)
RDW: 13.8 % (ref 11.5–15.5)
WBC: 5.5 10*3/uL (ref 4.0–10.5)
nRBC: 0 % (ref 0.0–0.2)

## 2023-01-14 LAB — BASIC METABOLIC PANEL
Anion gap: 7 (ref 5–15)
BUN: 19 mg/dL (ref 8–23)
CO2: 26 mmol/L (ref 22–32)
Calcium: 9.2 mg/dL (ref 8.9–10.3)
Chloride: 106 mmol/L (ref 98–111)
Creatinine, Ser: 1.83 mg/dL — ABNORMAL HIGH (ref 0.61–1.24)
GFR, Estimated: 36 mL/min — ABNORMAL LOW (ref >60)
Glucose, Bld: 89 mg/dL (ref 70–99)
Potassium: 4 mmol/L (ref 3.5–5.1)
Sodium: 139 mmol/L (ref 135–145)

## 2023-01-14 LAB — ECHO TEE

## 2023-01-14 LAB — MAGNESIUM: Magnesium: 1.9 mg/dL (ref 1.7–2.4)

## 2023-01-14 SURGERY — TRANSESOPHAGEAL ECHOCARDIOGRAM (TEE) (CATHLAB)
Anesthesia: General

## 2023-01-14 MED ORDER — BUTAMBEN-TETRACAINE-BENZOCAINE 2-2-14 % EX AERO
INHALATION_SPRAY | CUTANEOUS | Status: DC | PRN
  Administered 2023-01-14: 1 via TOPICAL

## 2023-01-14 MED ORDER — MENTHOL 3 MG MT LOZG
1.0000 | LOZENGE | OROMUCOSAL | Status: DC | PRN
  Administered 2023-01-14: 3 mg via ORAL
  Filled 2023-01-14: qty 9

## 2023-01-14 MED ORDER — PROPOFOL 500 MG/50ML IV EMUL
INTRAVENOUS | Status: DC | PRN
  Administered 2023-01-14: 120 ug/kg/min via INTRAVENOUS

## 2023-01-14 MED ORDER — SODIUM CHLORIDE 0.9% FLUSH
10.0000 mL | Freq: Two times a day (BID) | INTRAVENOUS | Status: DC
  Administered 2023-01-14: 10 mL via INTRAVENOUS

## 2023-01-14 MED ORDER — LIDOCAINE HCL (CARDIAC) PF 100 MG/5ML IV SOSY
PREFILLED_SYRINGE | INTRAVENOUS | Status: DC | PRN
  Administered 2023-01-14: 50 mg via INTRAVENOUS

## 2023-01-14 MED ORDER — EMPAGLIFLOZIN 10 MG PO TABS
10.0000 mg | ORAL_TABLET | Freq: Every day | ORAL | Status: DC
  Administered 2023-01-14 – 2023-01-15 (×2): 10 mg via ORAL
  Filled 2023-01-14 (×2): qty 1

## 2023-01-14 MED ORDER — PHENOL 1.4 % MT LIQD
1.0000 | OROMUCOSAL | Status: DC | PRN
  Administered 2023-01-14: 1 via OROMUCOSAL
  Filled 2023-01-14: qty 177

## 2023-01-14 MED ORDER — SODIUM CHLORIDE 0.9 % IV SOLN: INTRAVENOUS | Status: DC | PRN

## 2023-01-14 MED ORDER — PHENYLEPHRINE 80 MCG/ML (10ML) SYRINGE FOR IV PUSH (FOR BLOOD PRESSURE SUPPORT)
PREFILLED_SYRINGE | INTRAVENOUS | Status: DC | PRN
Start: 2023-01-14 — End: 2023-01-14
  Administered 2023-01-14: 120 ug via INTRAVENOUS
  Administered 2023-01-14 (×3): 80 ug via INTRAVENOUS

## 2023-01-14 MED ORDER — PROPOFOL 10 MG/ML IV BOLUS
INTRAVENOUS | Status: DC | PRN
  Administered 2023-01-14 (×2): 40 mg via INTRAVENOUS

## 2023-01-14 SURGICAL SUPPLY — 1 items: PAD DEFIB RADIO PHYSIO CONN (PAD) ×1 IMPLANT

## 2023-01-14 NOTE — Op Note (Signed)
INDICATIONS: atrial fibrillation   PROCEDURE:   Informed consent was obtained prior to the procedure. The risks, benefits and alternatives for the procedure were discussed and the patient comprehended these risks.  Risks include, but are not limited to, cough, sore throat, vomiting, nausea, somnolence, esophageal and stomach trauma or perforation, bleeding, low blood pressure, aspiration, pneumonia, infection, trauma to the teeth and death.    After a procedural time-out, the oropharynx was anesthetized with 20% benzocaine spray.   During this procedure the patient was administered IV propofol by [facility], Dr. Milderd Meager.  The transesophageal probe was inserted in the esophagus and stomach without difficulty and multiple views were obtained.  The patient was kept under observation until the patient left the procedure room.  The patient left the procedure room in stable condition.   Agitated microbubble saline contrast was not administered.  COMPLICATIONS:    There were no immediate complications.  FINDINGS:  Severely depressed left ventricular function, due to global hypokinesis. EF 20%. Mild central mitral insufficiencu. 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  RECOMMENDATIONS:     Delay cardioversion until a minimum of 3 weeks of uninterrupted anticoagulation.  Time Spent Directly with the Patient:  30 minutes   Brandon [name] 01/14/2023, 9:46 AM

## 2023-01-14 NOTE — Discharge Summary (Incomplete)
Discharge Summary    Patient ID: Brandon [name] MRN: [id number]; DOB: 1941/02/10  Admit date: 01/10/2023 Discharge date: 01/15/2023  PCP:  [name], NP   [facility] Providers Cardiologist:  [name], MD        Discharge Diagnoses    Principal Problem:   Acute on chronic systolic heart failure [facility]) Active Problems:   New onset atrial fibrillation (HCC)   Obstructive sleep apnea   Essential hypertension   Hyperlipidemia   Mitral regurgitation   Thrombus of left atrial appendage   Acute kidney injury superimposed on chronic kidney disease (HCC)    Diagnostic Studies/Procedures    TTE 01/11/2023: Impressions:  1. Left ventricular ejection fraction, by estimation, is 20 to 25%. The  left ventricle has severely decreased function. The left ventricle  demonstrates global hypokinesis. The left ventricular internal cavity size  was severely dilated. Left ventricular  diastolic function could not be evaluated.   2. Right ventricular systolic function is normal. The right ventricular  size is moderately enlarged. There is moderately elevated pulmonary artery  systolic pressure. The estimated right ventricular systolic pressure is  48.4 mmHg.   3. Left atrial size was severely dilated.   4. Right atrial size was moderately dilated.   5. The mitral valve is normal in structure. Moderate to severe mitral  valve regurgitation. No evidence of mitral stenosis.   6. The aortic valve is tricuspid. Aortic valve regurgitation is trivial.  Aortic valve sclerosis/calcification is present, without any evidence of  aortic stenosis. Aortic valve area, by VTI measures 1.80 cm. Aortic valve  mean gradient measures 2.2 mmHg.   Aortic valve Vmax measures 1.06 m/s.   7. The inferior vena cava is normal in size with <50% respiratory  variability, suggesting right atrial pressure of 8 mmHg.  _____________  Right Cardiac Catheterization  01/13/2023: Hemodynamics: RA:                  5 mmHg (mean) RV:                  40/3-3-5 mmHg PA:                  39/24 mmHg (30 mean) PCWP:            14 mmHg (mean)                                      Estimated Fick CO/CI   5.2 L/min, 2.4 L/min/m2 Thermodilution CO/CI   4.3 L/min, 2 L/min/m2                                                 TPG                 16  mmHg                                            PVR                 3-3.7 Wood Units  [employer]  3       Impression: Normal pre and post capillary filling pressures.  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  Mild to moderately reduced cardiac output / index; reduced by thermodilution.  _______________  TEE/ DCCV 01/14/2023: Impressions: 1. Left ventricular ejection fraction, by estimation, is 20 to 25%. The  left ventricle has severely decreased function. The left ventricle  demonstrates global hypokinesis. The left ventricular internal cavity size  was severely dilated. Left ventricular  diastolic function could not be evaluated.   2. Right ventricular systolic function is mildly reduced. The right  ventricular size is mildly enlarged. There is normal pulmonary artery  systolic pressure. The estimated right ventricular systolic pressure is  24.6 mmHg.   3. There is a 9 mm x 4 mm mobile thrombus in the mid left atrial  appendage. Left atrial size was severely dilated. A left atrial/left  atrial appendage thrombus was detected.   4. Right atrial size was moderately dilated.   5. The mitral valve is normal in structure. Mild to moderate mitral valve  regurgitation.   6. The aortic valve is tricuspid. Aortic valve regurgitation is trivial.  7. No aortic stenosis is present.   DCCV not performed due to LAA thrombus.    History of Present Illness     Brandon [name] is a 82 y.o. male with a history of non-ischemic cardiomyopathy/ chronic CHrEF  with EF of 45-50% on Echo in 04/2022, hypertension, CVA in 04/2022, obstructive sleep apnea, anemia, and CKD stage III who is followed by Dr. Antoine Poche. Patient has a long history of non-ischemic cardiomyopathy/ HFrEF. Cardiac catheterization was reportedly negative for obstructive CAD in the past. Last Echo in 04/2022 showed LVEF of 45-50% with global hypokinesis. Patient was seen in the office on 12/15/2022 by Joni Reining, NP, at which time he reported worsening lower extremity edema over the last 2 weeks as well as mild dyspnea on exertion. He did increase his Lasix for 3 days but did not have any appreciable diuresis with this. He denied any chest pain or palpitations. He was noted to be in new onset rate controlled atrial fibrillation and acute CHF. He was started on anticoagulation with Eliquis and was directly admitted from the office for IV diuresis and possible DCCV.  Hospital Course     Consultants: None   Acute on Chronic CHF Non-Ishcemic Cardiomyopathy Patient has a long history of non-ischemic cardiomyopathy. He was admitted from the office on 01/10/2023 for acute CHF and new onset atrial fibrillation as described above. Chest x-ray showed minimal bibasilar subsegmental atelectasis with small pleural effusions. Echo showed LVEF of 20-25% (down from 45-50% in 04/2022) with global hypokinesis, moderately enlarged RV with normal RV function and moderately elevated PASP of 48.4 mmHg, and moderate to severe MR. He was diuresed with IV Lasix but this was stopped on 10/29 due to worsening renal function. 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 Will resume home Lasix 20mg  daily at discharge. GDMT limited by soft BP. Home Entresto and Spironolactone were stopped and  Toprol-XL was decreased to 25mg  daily. He was started on Jardiance 10mg  daily. Will repeat BMET in 1 week. May be able to restart Entresto (or ARB) and/or Spironolactone at follow-up visit if BP and  renal function allow. Unclear what caused such a significant drop in his EF. Possibly atrial fibrillation but he was rate controlled on presentation. Cannot rule out ischemia. LHC was not performed this admission due to renal function. Can consider repeat Echo as an outpatient after restoration of sinus rhythm. If no improvement, can consider ischemic evaluation at that time.  New Onset Atrial Fibrillation Left Atrial Appendage Thrombus Patient was noted to be in new onset rate controlled atrial fibrillation at office visit on 01/10/2023. He has remained rate controlled. He was initially started on IV Heparin given plans for RHC. He was started on Toprol-XL  and Eliquis on 01/13/2023. He went for TEE/ DCCV on 01/14/1023. TEE showed a severely dilated left atrium with a thrombus so DCCV was not performed. He will be discharged on Toprol-XL 25mg  daily and Eliquis 2.5mg  twice daily (reduced dose due to age and renal function). Home Aspirin stopped now that he is on a DOAC. Will likely repeat TEE/ DCCV after 6-8 weeks of anticoagulation.  Mitral Regurgitation TTE showed moderate to severe MR but TEE showed only mild to moderate MR. Can continue to monitor as an outpatient.   Hypertension Patient has a history of hypertension; however, BP has been soft but stable throughout admission. Home Amlodipine, Entresto, and Spironolactone were stopped and Toprol-XL was decreased to 25mg  daily.  AKI on CKD Stage III Baseline creatinine around 1.3 to 1.4. Creatinine 1.49 on admission and slowly increased with diuresis. Creatinine 1.83 on 11/1. Home Entresto and Spironolactone have been stopped. Will repeat BMET in 1 week.   Obstructive Sleep Apnea Continue CPAP  Patient seen and examined by Dr. Jens Som and determined  to be stable for discharge. Outpatient follow-up arranged. Medications as below.  Did the patient have an acute coronary syndrome (MI, NSTEMI, STEMI, etc) this admission?:  No                               Did the patient have a percutaneous coronary intervention (stent / angioplasty)?:  No.    _____________  Discharge Vitals Blood pressure 106/78, pulse 85, temperature 98.6 F (37 C), temperature source Oral, resp. rate (!) 28, weight 92 kg, SpO2 95%.  Filed Weights   01/11/23 0409 01/12/23 0416 01/15/23 0638  Weight: 97.3 kg 93.1 kg 92 kg    Labs & Radiologic Studies    CBC Recent Labs    01/14/23 0530 01/15/23 0308  WBC 5.5 6.4  HGB 10.3* 10.1*  HCT 32.3* 31.4*  MCV 95.6 95.2  PLT 141* 136*   Basic Metabolic Panel Recent Labs    13/08/65 0420 01/13/23 1053 01/14/23 0530  NA 138 139  139 139  K 3.4* 4.1  4.1 4.0  CL 102  --  106  CO2 26  --  26  GLUCOSE 87  --  89  BUN 18  --  19  CREATININE 1.82*  --  1.83*  CALCIUM 9.0  --  9.2  MG  --   --  1.9   Liver Function Tests No results for input(s): "AST", "ALT", "ALKPHOS", "BILITOT", "PROT", "ALBUMIN" in the last 72 hours. No results for input(s): "LIPASE", "AMYLASE" in the last 72 hours. High Sensitivity Troponin:   No results for input(s): "TROPONINIHS" in the last 720 hours.  BNP Invalid input(s): "POCBNP" D-Dimer No results for input(s): "DDIMER" in the last 72 hours. Hemoglobin A1C No results for input(s): "HGBA1C" in the last 72 hours.  Fasting Lipid Panel No results for input(s): "CHOL", "HDL", "LDLCALC", "TRIG", "CHOLHDL", "LDLDIRECT" in the last 72 hours. Thyroid Function Tests No results for input(s): "TSH", "T4TOTAL", "T3FREE", "THYROIDAB" in the last 72 hours.  Invalid input(s): "FREET3" _____________  ECHO TEE  Result Date: 01/14/2023    TRANSESOPHOGEAL ECHO REPORT   Patient Name:   Bryley E [name] Date of Exam: 01/14/2023 Medical Rec #:  [id number]       Height:       71.0 in Accession #:     [id number]      Weight:       205.2 lb Date of Birth:  1940-10-23      BSA:          2.132 m Patient Age:    82 years        BP:           102/87 mmHg Patient Gender: M               HR:           78 bpm. Exam Location:  Inpatient Procedure: Transesophageal Echo, Color Doppler and Cardiac Doppler Indications:     Atrial Fibrillation  History:         Patient has prior history of Echocardiogram examinations, most                  recent 01/11/2023. Cardiomyopathy, Arrythmias:RBBB and PVC,                  Signs/Symptoms:Shortness of Breath; Risk Factors:Sleep Apnea                  and Hypertension. CKD.  Sonographer:     Lucendia Herrlich RCS Sonographer#2:   Delcie Roch RDCS Referring Phys:  [id number] Jonita Albee Diagnosing Phys: Thurmon Fair MD PROCEDURE: After discussion of the risks and benefits of a TEE, an informed consent was obtained from the patient. The transesophogeal probe was passed without difficulty through the esophogus of the patient. Imaged were obtained with the patient in a left lateral decubitus position. Local oropharyngeal anesthetic was provided with Cetacaine. Sedation performed by different physician. The patient was monitored while under deep sedation. Anesthestetic sedation was provided intravenously by Anesthesiology: 173mg  of Propofol, 50mg  of Lidocaine. The patient developed no complications during the procedure.  IMPRESSIONS  1. Left ventricular ejection fraction, by estimation, is 20 to 25%. The left ventricle has severely decreased function. The left ventricle demonstrates global hypokinesis. The left ventricular internal cavity size was severely dilated. Left ventricular diastolic function could not be evaluated.  2. Right ventricular systolic function is mildly reduced. The right ventricular size is mildly enlarged. There is normal pulmonary artery systolic pressure. The estimated right ventricular systolic pressure is 24.6 mmHg.  3. There is a 9 mm x 4 mm mobile  thrombus in the mid left atrial appendage. Left atrial size was severely dilated. A left atrial/left atrial appendage thrombus was detected.  4. Right atrial size was moderately dilated.  5. The mitral valve is normal in structure. Mild to moderate mitral valve regurgitation.  6. The aortic valve is tricuspid. Aortic valve regurgitation is trivial. No aortic stenosis is present. FINDINGS  Left Ventricle: Left ventricular ejection fraction, by estimation, is 20 to 25%. The left ventricle has severely decreased function. The left ventricle demonstrates global hypokinesis. The left ventricular internal cavity size was severely dilated. Left  ventricular diastolic function could not be evaluated due to atrial fibrillation. Left ventricular  diastolic function could not be evaluated. Right Ventricle: The right ventricular size is mildly enlarged. No increase in right ventricular wall thickness. Right ventricular systolic function is mildly reduced. There is normal pulmonary artery systolic pressure. The tricuspid regurgitant velocity  is 2.27 m/s, and with an assumed right atrial pressure of 4 mmHg, the estimated right ventricular systolic pressure is 24.6 mmHg. Left Atrium: There is a 9 mm x 4 mm mobile thrombus in the mid left atrial appendage. Left atrial size was severely dilated. Spontaneous echo contrast was present in the left atrium. A left atrial/left atrial appendage thrombus was detected. Right Atrium: Right atrial size was moderately dilated. Pericardium: There is no evidence of pericardial effusion. Mitral Valve: The mitral valve is normal in structure. Mild to moderate mitral valve regurgitation, with centrally-directed jet. Tricuspid Valve: The tricuspid valve is normal in structure. Tricuspid valve regurgitation is trivial. Aortic Valve: The aortic valve is tricuspid. Aortic valve regurgitation is trivial. No aortic stenosis is present. Pulmonic Valve: The pulmonic valve was normal in structure. Pulmonic  valve regurgitation is not visualized. No evidence of pulmonic stenosis. Aorta: The aortic root, ascending aorta, aortic arch and descending aorta are all structurally normal, with no evidence of dilitation or obstruction. There is minimal (Grade I) plaque. IAS/Shunts: No atrial level shunt detected by color flow Doppler.  TRICUSPID VALVE TR Peak grad:   20.6 mmHg TR Vmax:        227.00 cm/s Thurmon Fair MD Electronically signed by Thurmon Fair MD Signature Date/Time: 01/14/2023/12:32:37 PM    Final    EP STUDY  Result Date: 01/14/2023 See surgical note for result.  CARDIAC CATHETERIZATION  Result Date: 01/13/2023 HEMODYNAMICS: RA:   5 mmHg (mean) RV:   40/3-3-5 mmHg PA:   39/24 mmHg (30 mean) PCWP:  14 mmHg (mean)    Estimated Fick CO/CI   5.2 L/min, 2.4 L/min/m2 Thermodilution CO/CI   4.3 L/min, 2 L/min/m2    TPG    16  mmHg     PVR     3-3.7 Wood Units PAPi      3  IMPRESSION: Normal pre and post capillary filling pressures. 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 Mild to moderately reduced cardiac output / index; reduced by thermodilution. Aditya Sabharwal Advanced Heart Failure 11:10 AM  DG Chest 2 View  Result Date: 01/11/2023 CLINICAL DATA:  Congestive heart failure. EXAM: CHEST - 2 VIEW COMPARISON:  November 30, 2022. FINDINGS: Stable cardiomegaly. Minimal bibasilar subsegmental atelectasis is noted with small pleural effusions. IMPRESSION: Minimal bibasilar subsegmental atelectasis with small pleural effusions. Electronically Signed   By: Lupita Raider M.D.   On: 01/11/2023 12:09   ECHOCARDIOGRAM COMPLETE  Result Date: 01/11/2023    ECHOCARDIOGRAM REPORT   Patient Name:   Brandon [name] Date of Exam: 01/11/2023 Medical Rec #:  [id number]       Height:       71.0 in Accession #:    [id number]      Weight:       214.5 lb Date of Birth:  29-Nov-1940      BSA:          2.172 m Patient Age:    82 years        BP:           107/76  mmHg Patient Gender: M               HR:  74 bpm. Exam Location:  Inpatient Procedure: 2D Echo, Cardiac Doppler and Color Doppler Indications:    CHF-Acute Systolic I50.21  History:        Patient has prior history of Echocardiogram examinations, most                 recent 04/18/2022. CHF, Stroke; Risk Factors:Dyslipidemia, Sleep                 Apnea and Hypertension.  Sonographer:    Dondra Prader RVT RCS Referring Phys: [id number] KATHRYN M LAWRENCE IMPRESSIONS  1. Left ventricular ejection fraction, by estimation, is 20 to 25%. The left ventricle has severely decreased function. The left ventricle demonstrates global hypokinesis. The left ventricular internal cavity size was severely dilated. Left ventricular diastolic function could not be evaluated.  2. Right ventricular systolic function is normal. The right ventricular size is moderately enlarged. There is moderately elevated pulmonary artery systolic pressure. The estimated right ventricular systolic pressure is 48.4 mmHg.  3. Left atrial size was severely dilated.  4. Right atrial size was moderately dilated.  5. The mitral valve is normal in structure. Moderate to severe mitral valve regurgitation. No evidence of mitral stenosis.  6. The aortic valve is tricuspid. Aortic valve regurgitation is trivial. Aortic valve sclerosis/calcification is present, without any evidence of aortic stenosis. Aortic valve area, by VTI measures 1.80 cm. Aortic valve mean gradient measures 2.2 mmHg.  Aortic valve Vmax measures 1.06 m/s.  7. The inferior vena cava is normal in size with <50% respiratory variability, suggesting right atrial pressure of 8 mmHg. FINDINGS  Left Ventricle: Left ventricular ejection fraction, by estimation, is 20 to 25%. The left ventricle has severely decreased function. The left ventricle demonstrates global hypokinesis. The left ventricular internal cavity size was severely dilated. There is no left ventricular hypertrophy. Left ventricular  diastolic function could not be evaluated. Right Ventricle: The right ventricular size is moderately enlarged. No increase in right ventricular wall thickness. Right ventricular systolic function is normal. There is moderately elevated pulmonary artery systolic pressure. The tricuspid regurgitant  velocity is 3.18 m/s, and with an assumed right atrial pressure of 8 mmHg, the estimated right ventricular systolic pressure is 48.4 mmHg. Left Atrium: Left atrial size was severely dilated. Right Atrium: Right atrial size was moderately dilated. Pericardium: There is no evidence of pericardial effusion. Mitral Valve: The mitral valve is normal in structure. Moderate to severe mitral valve regurgitation. No evidence of mitral valve stenosis. Tricuspid Valve: The tricuspid valve is normal in structure. Tricuspid valve regurgitation is trivial. No evidence of tricuspid stenosis. Aortic Valve: The aortic valve is tricuspid. Aortic valve regurgitation is trivial. Aortic valve sclerosis/calcification is present, without any evidence of aortic stenosis. Aortic valve mean gradient measures 2.2 mmHg. Aortic valve peak gradient measures 4.5 mmHg. Aortic valve area, by VTI measures 1.80 cm. Pulmonic Valve: The pulmonic valve was normal in structure. Pulmonic valve regurgitation is not visualized. No evidence of pulmonic stenosis. Aorta: The aortic root is normal in size and structure. Venous: The inferior vena cava is normal in size with less than 50% respiratory variability, suggesting right atrial pressure of 8 mmHg. IAS/Shunts: No atrial level shunt detected by color flow Doppler.  LEFT VENTRICLE PLAX 2D LVIDd:         6.80 cm   Diastology LVIDs:         5.70 cm   LV e' medial:    6.73 cm/s LV PW:  0.90 cm   LV E/e' medial:  12.5 LV IVS:        1.10 cm   LV e' lateral:   7.70 cm/s LVOT diam:     2.20 cm   LV E/e' lateral: 10.9 LV SV:         33 LV SV Index:   15 LVOT Area:     3.80 cm                           3D Volume  EF:                          3D EF:        23 %                          LV EDV:       263 ml                          LV ESV:       202 ml                          LV SV:        61 ml RIGHT VENTRICLE            IVC RV Basal diam:  4.70 cm    IVC diam: 1.80 cm RV S prime:     9.58 cm/s TAPSE (M-mode): 1.9 cm LEFT ATRIUM              Index        RIGHT ATRIUM           Index LA diam:        5.00 cm  2.30 cm/m   RA Area:     25.90 cm LA Vol (A2C):   170.0 ml 78.26 ml/m  RA Volume:   85.20 ml  39.22 ml/m LA Vol (A4C):   90.8 ml  41.80 ml/m LA Biplane Vol: 131.0 ml 60.31 ml/m  AORTIC VALVE                    PULMONIC VALVE AV Area (Vmax):    2.66 cm     PV Vmax:       0.84 m/s AV Area (Vmean):   2.56 cm     PV Peak grad:  2.8 mmHg AV Area (VTI):     1.80 cm AV Vmax:           105.70 cm/s AV Vmean:          69.200 cm/s AV VTI:            0.186 m AV Peak Grad:      4.5 mmHg AV Mean Grad:      2.2 mmHg LVOT Vmax:         74.03 cm/s LVOT Vmean:        46.675 cm/s LVOT VTI:          0.088 m LVOT/AV VTI ratio: 0.47  AORTA Ao Root diam: 3.10 cm MITRAL VALVE               TRICUSPID VALVE MV Area (PHT): 6.32 cm    TR Peak grad:   40.4 mmHg MV Decel Time: 120 msec    TR Vmax:  318.00 cm/s MV E velocity: 84.30 cm/s                            SHUNTS                            Systemic VTI:  0.09 m                            Systemic Diam: 2.20 cm Armanda Magic MD Electronically signed by Armanda Magic MD Signature Date/Time: 01/11/2023/11:20:06 AM    Final    Disposition   Patient is being discharged home today in good condition.  Follow-up Plans & Appointments     Follow-up Information     [facility]. Go in 13 day(s).   Specialty: Cardiology Why: Hospital follow-up in the Advanced [facility] Clinic. Please bring medication list. Free Valet Parking, Entrance C, off of [address]. Contact information: [address] [place]  [address] [phone]        Corrin Parker, PA-C Follow up.   Specialty: Cardiology Why: Hospital follow-up with General Cardiology scheduled for 02/17/2023 at 10:30am. Please arrive 15 minutes early for check-in. If this date/ time does not work for you, please call our office to reschedule. Contact information: [address] [place] [address] [place] [address] [phone]         [facility] Follow up.   Specialty: Cardiology Why: Please come by Dr. Jenene Slicker office on 01/21/2023 for repeat lab work so that we can make sure your kidney function is stable. You do not have to have an appointment for this visit. You can come by anytime from 8am to 4:30pm (lab is usually closed around 1-2pm for lunch). You do not have to be fasting for this. Contact information: [address] [place] [address] [phone]               Discharge Instructions     Diet - low sodium heart healthy   Complete by: As directed    Increase activity slowly   Complete by: As directed         Discharge Medications   Allergies as of 01/15/2023       Reactions   Ace Inhibitors Cough   Isosorb Dinitrate-hydralazine Other (See Comments)   REACTION: dizziness/hypotensive        Medication List     STOP taking these medications    amLODipine 10 MG tablet Commonly known as: NORVASC   aspirin EC 81 MG tablet   sacubitril-valsartan 97-103 MG Commonly known as: ENTRESTO   spironolactone 25 MG tablet Commonly known as: ALDACTONE       TAKE these medications    albuterol (2.5 MG/3ML) 0.083% nebulizer solution Commonly known as: PROVENTIL TAKE 3 MLS BY NEBULIZER EVERY 6 HOURS AS NEEDED FOR WHEEZING FOR SHORTNESS OF BREATH What changed: See the new instructions.   albuterol 108 (90 Base) MCG/ACT inhaler Commonly known as: VENTOLIN HFA Inhale 1-2 puffs into the lungs every 6 (six) hours as needed for wheezing or  shortness of breath. What changed: Another medication with the same name was changed. Make sure you understand how and when to take each.   apixaban 2.5 MG Tabs tablet Commonly known as: ELIQUIS Take 1 tablet (2.5 mg total) by mouth  2 (two) times daily. What changed:  medication strength how much to take   cyanocobalamin 1000 MCG tablet Commonly known as: VITAMIN B12 Take 1 tablet (1,000 mcg total) by mouth daily.   empagliflozin 10 MG Tabs tablet Commonly known as: JARDIANCE Take 1 tablet (10 mg total) by mouth daily.   fluticasone-salmeterol 500-50 MCG/ACT Aepb Commonly known as: ADVAIR Inhale 1 puff into the lungs in the morning and at bedtime.   folic acid 1 MG tablet Commonly known as: FOLVITE Take 1 tablet (1 mg total) by mouth daily.   furosemide 20 MG tablet Commonly known as: LASIX Take 1 tablet (20 mg total) by mouth daily.   gabapentin 300 MG capsule Commonly known as: NEURONTIN Take 1 capsule (300 mg total) by mouth at bedtime.   leflunomide 20 MG tablet Commonly known as: ARAVA Take 20 mg by mouth daily.   metoprolol succinate 25 MG 24 hr tablet Commonly known as: TOPROL-XL Take 1 tablet (25 mg total) by mouth daily. What changed: how much to take   montelukast 10 MG tablet Commonly known as: SINGULAIR Take 1 tablet (10 mg total) by mouth at bedtime.   omeprazole 20 MG capsule Commonly known as: PRILOSEC Take 1 capsule (20 mg total) by mouth daily.   rosuvastatin 10 MG tablet Commonly known as: CRESTOR Take 1 tablet (10 mg total) by mouth daily.   tamsulosin 0.4 MG Caps capsule Commonly known as: FLOMAX Take 1 capsule by mouth once daily           Outstanding Labs/Studies   Repeat BMET in 1 week.   Duration of Discharge Encounter   Greater than 30 minutes including physician time.  Signed, Corrin Parker, PA-C 01/15/2023, 8:46 AM

## 2023-01-14 NOTE — Interval H&P Note (Signed)
History and Physical Interval Note:  01/14/2023 8:30 AM  Brandon [name]  has presented today for surgery, with the diagnosis of afibt.  The various methods of treatment have been discussed with the patient and family. After consideration of risks, benefits and other options for treatment, the patient has consented to  Procedure(s): TRANSESOPHAGEAL ECHOCARDIOGRAM (N/A) CARDIOVERSION (CATH LAB) (N/A) as a surgical intervention.  The patient's history has been reviewed, patient examined, no change in status, stable for surgery.  I have reviewed the patient's chart and labs.  Questions were answered to the patient's satisfaction.     Alston Berrie

## 2023-01-14 NOTE — Plan of Care (Signed)
Cardiology event note  Key event: - Patient found to have new LAA- thrombus.  This would keep him from DCCV.  Patient notes no symptoms: No chest pain or pressure .  No SOB/DOE and no PND/Orthopnea.  No palpitations or syncope .  Key diagnostics - Severe LV dysfunction with biventricular dilation, mild to moderate MR with RR interval variation.  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   AP:  - reviewed anesthesia recommendations for someone to stay with him 24 hours after moderate sedation; he does not believe that this could be arranged - will Start Jardiance 10 mg now - working to arrange follow up with Dr. Jenene Slicker team for up-titration of GDMT and plan for future DCCV - plan for discharge tomorrow  Time Spent Directly with Patient:   I have spent a total of 18 additional minutes with the patient reviewing notes, imaging,  and examining the patient as well as establishing an assessment and plan that was discussed personally with the patient. Discussed disease state education (LAA thrombus), establishing safe discharge planning.  [name], MD FASE [facility] Cardiologist [facility]  [address] [place], [address] [place], [place] [address] [phone]  1:46 PM

## 2023-01-14 NOTE — Anesthesia Postprocedure Evaluation (Signed)
Anesthesia Post Note  Patient: Faysal E [place]  Procedure(s) Performed: TRANSESOPHAGEAL ECHOCARDIOGRAM     Patient location during evaluation: PACU Anesthesia Type: MAC Level of consciousness: awake and alert, oriented and patient cooperative Pain management: pain level controlled Vital Signs Assessment: post-procedure vital signs reviewed and stable Respiratory status: spontaneous breathing, nonlabored ventilation and respiratory function stable Cardiovascular status: blood pressure returned to baseline and stable Postop Assessment: no apparent nausea or vomiting Anesthetic complications: no   No notable events documented.  Last Vitals:  Vitals:   01/14/23 1000 01/14/23 1005  BP: 93/66 101/76  Pulse: 81 98  Resp: 15 14  Temp:    SpO2: 97% 97%    Last Pain:  Vitals:   01/14/23 0954  TempSrc: Temporal  PainSc: 0-No pain                 Lannie Fields

## 2023-01-14 NOTE — H&P (View-Only) (Signed)
   Patient Name: Brandon [name] [place] Date of Encounter: 01/14/2023 [facility] Cardiologist: [name], MD   Interval Summary  .    Feels back to baseline Asymptomatic of atrial fibrillation.  Vital Signs .    Vitals:   01/13/23 2028 01/13/23 2306 01/14/23 0330 01/14/23 0754  BP:  105/68 103/81 100/79  Pulse:  99 87 99  Resp:  20 11 (!) 23  Temp:  98.5 F (36.9 C) 98.8 F (37.1 C) 97.8 F (36.6 C)  TempSrc:  Oral Oral Temporal  SpO2: 93% 98% 96% 95%  Weight:        Intake/Output Summary (Last 24 hours) at 01/14/2023 0814 Last data filed at 01/13/2023 1300 Gross per 24 hour  Intake 425.76 ml  Output 225 ml  Net 200.76 ml      01/12/2023    4:16 AM 01/11/2023    4:09 AM 01/10/2023    2:48 PM  Last 3 Weights  Weight (lbs) 205 lb 4 oz 214 lb 8.1 oz 223 lb 12.8 oz  Weight (kg) 93.1 kg 97.3 kg 101.515 kg      Telemetry/ECG    Atrial fibrillation with PVCs. HR in the 80s-90s - Personally Reviewed  Physical Exam .    GEN: No acute distress.  Neck: No JVD Cardiac: Irregular rate and rhythm. systolic murmur. Radial pulses 2+ bilaterally   Respiratory: Clear to auscultation bilaterally. GI: Soft, nontender, non-distended  MS: isolated left leg swelling R arm 22 G IV  Assessment & Plan .     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 Also possible that patient worsening CKD with new baseline. - 3L negative with normalization of RA pressure - Patient appears euvolemic on exam today  - He is planned for DCCV today - if successful cardioversion, will plan for SGLT2i (I suspect Creatinine is unmasking underlying CKD) and succinate dosing, without restart or ARNI when his blood pressure comes back up - if return to AF, will start AAD therapy - If severe MR, needs AHF eval, aggressive GDMT, and repeat echo 03/2023 for consideration of mTEER     Atrial Fibrillation  PACs with NSVT  Baseline RBBB - Patient was diagnosed with afib on 10/29 in clinic  - Currently on IV heparin pending RHC  - Per telemetry- patient remains in atrial fibrillation with PVCs. 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 [place], [address] [place], [place] [address] [phone]  8:14 AM

## 2023-01-14 NOTE — Plan of Care (Signed)
  Problem: Education: Goal: Knowledge of General Education information will improve Description: Including pain rating scale, medication(s)/side effects and non-pharmacologic comfort measures Outcome: Progressing   Problem: Health Behavior/Discharge Planning: Goal: Ability to manage health-related needs will improve Outcome: Progressing   Problem: Clinical Measurements: Goal: Ability to maintain clinical measurements within normal limits will improve Outcome: Progressing Goal: Will remain free from infection Outcome: Progressing Goal: Diagnostic test results will improve Outcome: Progressing Goal: Respiratory complications will improve Outcome: Progressing Goal: Cardiovascular complication will be avoided Outcome: Progressing   Problem: Activity: Goal: Risk for activity intolerance will decrease Outcome: Progressing   Problem: Nutrition: Goal: Adequate nutrition will be maintained Outcome: Progressing   Problem: Coping: Goal: Level of anxiety will decrease Outcome: Progressing   Problem: Elimination: Goal: Will not experience complications related to bowel motility Outcome: Progressing Goal: Will not experience complications related to urinary retention Outcome: Progressing   Problem: Pain Management: Goal: General experience of comfort will improve Outcome: Progressing   Problem: Safety: Goal: Ability to remain free from injury will improve Outcome: Progressing   Problem: Skin Integrity: Goal: Risk for impaired skin integrity will decrease Outcome: Progressing   Problem: Education: Goal: Ability to demonstrate management of disease process will improve Outcome: Progressing Goal: Ability to verbalize understanding of medication therapies will improve Outcome: Progressing Goal: Individualized Educational Video(s) Outcome: Progressing   Problem: Activity: Goal: Capacity to carry out activities will improve Outcome: Progressing   Problem: Cardiac: Goal:  Ability to achieve and maintain adequate cardiopulmonary perfusion will improve Outcome: Progressing   Problem: Education: Goal: Understanding of CV disease, CV risk reduction, and recovery process will improve Outcome: Progressing Goal: Individualized Educational Video(s) Outcome: Progressing   Problem: Activity: Goal: Ability to return to baseline activity level will improve Outcome: Progressing   Problem: Cardiovascular: Goal: Ability to achieve and maintain adequate cardiovascular perfusion will improve Outcome: Progressing Goal: Vascular access site(s) Level 0-1 will be maintained Outcome: Progressing   Problem: Health Behavior/Discharge Planning: Goal: Ability to safely manage health-related needs after discharge will improve Outcome: Progressing   Problem: Education: Goal: Understanding of CV disease, CV risk reduction, and recovery process will improve Outcome: Progressing   Problem: Activity: Goal: Ability to return to baseline activity level will improve Outcome: Progressing

## 2023-01-14 NOTE — Transfer of Care (Signed)
Immediate Anesthesia Transfer of Care Note  Patient: Brandon [name]  Procedure(s) Performed: TRANSESOPHAGEAL ECHOCARDIOGRAM  Patient Location: PACU  Anesthesia Type:General  Level of Consciousness: drowsy  Airway & Oxygen Therapy: Patient Spontanous Breathing and Patient connected to nasal cannula oxygen  Post-op Assessment: Report given to RN and Post -op Vital signs reviewed and stable  Post vital signs: Reviewed and stable  Last Vitals:  Vitals Value Taken Time  BP 96/67 0950  Temp    Pulse 80   Resp 14   SpO2 98%     Last Pain:  Vitals:   01/14/23 0806  TempSrc:   PainSc: 0-No pain         Complications: No notable events documented.

## 2023-01-14 NOTE — Progress Notes (Signed)
   01/14/23 2248  BiPAP/CPAP/SIPAP  BiPAP/CPAP/SIPAP Pt Type Adult  BiPAP/CPAP/SIPAP Resmed  Mask Type Nasal pillows  FiO2 (%) 21 %  Flow Rate 0 lpm  Patient Home Equipment Yes  Safety Check Completed by RT for Home Unit Yes, no issues noted  BiPAP/CPAP /SiPAP Vitals  Resp 18  Bilateral Breath Sounds Clear;Diminished   Pt placed himself on home CPAP.

## 2023-01-14 NOTE — Progress Notes (Signed)
   Patient Name: Brandon [name] Date of Encounter: 01/14/2023 [facility] Cardiologist: [name], MD   Interval Summary  .    Feels back to baseline Asymptomatic of atrial fibrillation.  Vital Signs .    Vitals:   01/13/23 2028 01/13/23 2306 01/14/23 0330 01/14/23 0754  BP:  105/68 103/81 100/79  Pulse:  99 87 99  Resp:  20 11 (!) 23  Temp:  98.5 F (36.9 C) 98.8 F (37.1 C) 97.8 F (36.6 C)  TempSrc:  Oral Oral Temporal  SpO2: 93% 98% 96% 95%  Weight:        Intake/Output Summary (Last 24 hours) at 01/14/2023 0814 Last data filed at 01/13/2023 1300 Gross per 24 hour  Intake 425.76 ml  Output 225 ml  Net 200.76 ml      01/12/2023    4:16 AM 01/11/2023    4:09 AM 01/10/2023    2:48 PM  Last 3 Weights  Weight (lbs) 205 lb 4 oz 214 lb 8.1 oz 223 lb 12.8 oz  Weight (kg) 93.1 kg 97.3 kg 101.515 kg      Telemetry/ECG    Atrial fibrillation with PVCs. HR in the 80s-90s - Personally Reviewed  Physical Exam .    GEN: No acute distress.  Neck: No JVD Cardiac: Irregular rate and rhythm. systolic murmur. Radial pulses 2+ bilaterally   Respiratory: Clear to auscultation bilaterally. GI: Soft, nontender, non-distended  MS: isolated left leg swelling R arm 22 G IV  Assessment & Plan .     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 Also possible that patient worsening CKD with new baseline. - 3L negative with normalization of RA pressure - Patient appears euvolemic on exam today  - He is planned for DCCV today - if successful cardioversion, will plan for SGLT2i (I suspect Creatinine is unmasking underlying CKD) and succinate dosing, without restart or ARNI when his blood pressure comes back up - if return to AF, will start AAD therapy - If severe MR, needs AHF eval, aggressive GDMT, and repeat echo 03/2023 for consideration of mTEER     Atrial Fibrillation  PACs with NSVT  Baseline RBBB - Patient was diagnosed with afib on 10/29 in clinic  - Currently on IV heparin pending RHC  - Per telemetry- patient remains in atrial fibrillation with PVCs. 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 [place], [address] [place], [place] [address] [phone]  8:14 AM

## 2023-01-14 NOTE — Progress Notes (Signed)
Echocardiogram Echocardiogram Transesophageal has been performed.  Lucendia Herrlich 01/14/2023, 9:55 AM

## 2023-01-14 NOTE — Progress Notes (Signed)
Heart Failure Stewardship Pharmacist Progress Note   PCP: [name], NP PCP-Cardiologist: [name], MD    HPI:  [id number] initially presenting to outpatient cardiology with worsened LE edema x2 weeks, mild DOE. Compliant to medications - did not have increased UOP with incr dose of lasix to 40 mg daily x3 days. New afib on EKG. Admitted for IV diuresis, possible TEE guided DCCV. 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  Echo this admission demonstrates worsened EF to 20-25%, severely decreased LV function, global hypokinesis, dilated LV, RV normal - moderately enlarged, mod-severe MVR. CXR 10/29 with minimal bibasilar atelectasis with small pleural effusions. Planning for RHC 10/31 with AHF per cardiology.   Pt reports compliance to medication regimen at home- reports that he has a good system to keep track of his medications. Denies dietary excursions, other than recently eating [place] food. Was able to ambulate today with his walker and denied ShOB. Reports good response to IV loop diuretic. Monitors his BP at home - usually sees low 120s/70-80s. Denied any dizziness or lightheadedness when ambulating/standing up.   RHC 10/31 demonstrates RA mean 5, RV 40, PA mean 30, PCWP 14, Fick CO/CI 5.2/2.4, thermodilution CO/CI 4.3/2. Normal pre and post capillary filling pressures. 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 Mild to mod reduced CO/CI by thermodilution.  TEE 11/1 showed EF 20-25%, mild to moderate MR, and mobile thrombus in the left atrial appendage. Cardioversion delayed until a minimum of 3 weeks uninterrupted anticoagulation.    Current HF Medications: Beta Blocker: metoprolol succinate 25 mg PO daily  Prior to admission HF Medications: Diuretic: furosemide 20 mg PO daily (increased to 40 mg daily x3 days) Beta blocker: metoprolol succinate 37.5 mg PO daily ACE/ARB/ARNI:  Entresto 97-103 mg PO BID MRA: spironolactone 25 mg PO daily  HTN PTA: amlodipine 10 mg PO daily  Dispense hx OK.  Pertinent Lab Values: Serum creatinine 1.83 (BL 1.4-1.5), eGFR 36, CrCl 36,  BUN 19, Potassium 4.0, Sodium 139, BNP not drawn, Magnesium 1.9  Vital Signs: Weight: 205 lbs (admission weight: 214.51 lbs)  Blood pressure: 90-100/60-90s Heart rate: 80-90s (afib)  I/O: net -0.4L yesterday; net -3.1L since admission O2 98% on RA   Medication Assistance / Insurance Benefits Check: Does the patient have prescription insurance?  Yes Type of insurance plan: [facility] Medicare   Test claims (pt in the donut hole) - Eliquis: $149.53/30ds  Does the patient qualify for medication assistance through manufacturers or grants?   Yes Medication assistance applications approved: [employer] [place] Bucker (approved through Apr 2025) - currently using for Entresto, can be used for SGLT2i.  Outpatient Pharmacy:  Prior to admission outpatient pharmacy: [facility], [facility], [address] Is the patient willing to use [facility] TOC pharmacy at discharge? Yes Is the patient willing to transition their outpatient pharmacy to utilize a [facility] outpatient pharmacy?   No    Assessment: 1. 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 Delayed cardioversion with LA thrombus on TEE. NYHA class II symptoms. - LE edema resolved on exam. Only mildly elevated PA mean and PCWP on RHC, RA normal. Scr trending up but stable. Consider resuming furosemide 20 mg daily at discharge.  - Continue metoprolol XL 25 mg daily - Pt is a good candidate to initiate SGLT2i for HFrEF.  - Pt  has reported dizziness/hypotension documented with isosorbide/hydralazine in the past.    Plan: 1) Medication changes recommended at this time: - Start Farxiga 10 mg daily - patient can use his [employer] grant to cover the cost of this.  2)  Patient assistance: - Patient enrolled in [employer] Grant - covers Bowman, SGLT2i, and generic HF medications - Pt unlikely to qualify for Eliquis manufacturer PAP, and not covered by [employer].  3)  Education  - Patient has been educated on current HF medications and potential additions to HF medication regimen - Patient verbalizes understanding that over the next few months, these medication doses may change and more medications may be added to optimize HF regimen - Patient has been educated on basic disease state pathophysiology and goals of therapy   Sharen Hones, PharmD, BCPS Heart Failure Stewardship Pharmacist Phone [phone]

## 2023-01-14 NOTE — Anesthesia Preprocedure Evaluation (Addendum)
Anesthesia Evaluation  Patient identified by MRN, date of birth, ID band Patient awake    Reviewed: Allergy & Precautions, NPO status , Patient's Chart, lab work & pertinent test results, reviewed documented beta blocker date and time   Airway Mallampati: III  TM Distance: >3 FB Neck ROM: Full    Dental  (+) Edentulous Upper, Dental Advisory Given   Pulmonary asthma , sleep apnea and Continuous Positive Airway Pressure Ventilation , former smoker   Pulmonary exam normal breath sounds clear to auscultation       Cardiovascular hypertension (100/79 preop), Pt. on medications and Pt. on home beta blockers pulmonary hypertension (mod pHTN)+CHF (LVEF 20-25%)  + dysrhythmias (eliquis) Atrial Fibrillation + Valvular Problems/Murmurs (mod-severe MR) MR  Rhythm:Irregular Rate:Normal  Echo 12/2022  1. Left ventricular ejection fraction, by estimation, is 20 to 25%. The  left ventricle has severely decreased function. The left ventricle  demonstrates global hypokinesis. The left ventricular internal cavity size  was severely dilated. Left ventricular  diastolic function could not be evaluated.   2. Right ventricular systolic function is normal. The right ventricular  size is moderately enlarged. There is moderately elevated pulmonary artery  systolic pressure. The estimated right ventricular systolic pressure is  48.4 mmHg.   3. Left atrial size was severely dilated.   4. Right atrial size was moderately dilated.   5. The mitral valve is normal in structure. Moderate to severe mitral  valve regurgitation. No evidence of mitral stenosis.   6. The aortic valve is tricuspid. Aortic valve regurgitation is trivial.  Aortic valve sclerosis/calcification is present, without any evidence of  aortic stenosis. Aortic valve area, by VTI measures 1.80 cm. Aortic valve  mean gradient measures 2.2 mmHg.   Aortic valve Vmax measures 1.06 m/s.   7. The  inferior vena cava is normal in size with <50% respiratory  variability, suggesting right atrial pressure of 8 mmHg.     Neuro/Psych  PSYCHIATRIC DISORDERS  Depression    CVA (04/2022)    GI/Hepatic Neg liver ROS,GERD  Medicated and Controlled,,  Endo/Other  negative endocrine ROS    Renal/GU CRFRenal diseaseCKD3  negative genitourinary   Musculoskeletal  (+) Arthritis , Osteoarthritis and Rheumatoid disorders,    Abdominal  (+) + obese  Peds  Hematology negative hematology ROS (+)   Anesthesia Other Findings   Reproductive/Obstetrics negative OB ROS                             Anesthesia Physical Anesthesia Plan  ASA: 4  Anesthesia Plan: MAC   Post-op Pain Management:    Induction: Intravenous  PONV Risk Score and Plan: TIVA, Treatment may vary due to age or medical condition and Propofol infusion  Airway Management Planned: Nasal Cannula  Additional Equipment: None  Intra-op Plan:   Post-operative Plan:   Informed Consent: I have reviewed the patients History and Physical, chart, labs and discussed the procedure including the risks, benefits and alternatives for the proposed anesthesia with the patient or authorized representative who has indicated his/her understanding and acceptance.       Plan Discussed with: CRNA  Anesthesia Plan Comments:        Anesthesia Quick Evaluation

## 2023-01-15 ENCOUNTER — Other Ambulatory Visit (HOSPITAL_COMMUNITY): Payer: Self-pay

## 2023-01-15 ENCOUNTER — Other Ambulatory Visit: Payer: Self-pay | Admitting: Student

## 2023-01-15 DIAGNOSIS — I4819 Other persistent atrial fibrillation: Secondary | ICD-10-CM | POA: Diagnosis not present

## 2023-01-15 DIAGNOSIS — N189 Chronic kidney disease, unspecified: Secondary | ICD-10-CM

## 2023-01-15 DIAGNOSIS — I5023 Acute on chronic systolic (congestive) heart failure: Secondary | ICD-10-CM | POA: Diagnosis not present

## 2023-01-15 LAB — CBC
HCT: 31.4 % — ABNORMAL LOW (ref 39.0–52.0)
Hemoglobin: 10.1 g/dL — ABNORMAL LOW (ref 13.0–17.0)
MCH: 30.6 pg (ref 26.0–34.0)
MCHC: 32.2 g/dL (ref 30.0–36.0)
MCV: 95.2 fL (ref 80.0–100.0)
Platelets: 136 10*3/uL — ABNORMAL LOW (ref 150–400)
RBC: 3.3 MIL/uL — ABNORMAL LOW (ref 4.22–5.81)
RDW: 13.9 % (ref 11.5–15.5)
WBC: 6.4 10*3/uL (ref 4.0–10.5)
nRBC: 0 % (ref 0.0–0.2)

## 2023-01-15 MED ORDER — METOPROLOL SUCCINATE ER 25 MG PO TB24: 25.0000 mg | ORAL_TABLET | Freq: Every day | ORAL | Status: DC

## 2023-01-15 MED ORDER — EMPAGLIFLOZIN 10 MG PO TABS
10.0000 mg | ORAL_TABLET | Freq: Every day | ORAL | 2 refills | Status: DC
  Filled 2023-01-15: qty 30, 30d supply, fill #0

## 2023-01-15 MED ORDER — APIXABAN 2.5 MG PO TABS
2.5000 mg | ORAL_TABLET | Freq: Two times a day (BID) | ORAL | 2 refills | Status: DC
  Filled 2023-01-15: qty 60, 30d supply, fill #0

## 2023-01-15 NOTE — Progress Notes (Signed)
Ordered outpatient BMET to reasses renal function. Please see discharge summary from today for more information.  Corrin Parker, PA-C 01/15/2023 8:45 AM

## 2023-01-15 NOTE — Progress Notes (Signed)
Rounding Note    Patient Name: Brandon [name] Date of Encounter: 01/15/2023  [facility] Cardiologist: [name], MD   Subjective   No CP or dyspnea  Inpatient Medications    Scheduled Meds:  apixaban  2.5 mg Oral BID   cyanocobalamin  1,000 mcg Oral Daily   empagliflozin  10 mg Oral Daily   folic acid  1 mg Oral Daily   gabapentin  300 mg Oral QHS   metoprolol succinate  25 mg Oral Daily   mometasone-formoterol  2 puff Inhalation BID   montelukast  10 mg Oral QHS   pantoprazole  40 mg Oral Daily   rosuvastatin  10 mg Oral Daily   sodium chloride flush  3 mL Intravenous Q12H   tamsulosin  0.4 mg Oral Daily   Continuous Infusions:  PRN Meds: acetaminophen, albuterol, menthol-cetylpyridinium, ondansetron (ZOFRAN) IV, phenol, sodium chloride flush   Vital Signs    Vitals:   01/14/23 2300 01/14/23 2320 01/15/23 0336 01/15/23 0638  BP:  95/66 99/72   Pulse:  90 98   Resp: 20 14 20    Temp:  98.2 F (36.8 C) 98.4 F (36.9 C)   TempSrc:  Oral Oral   SpO2:  94% 96%   Weight:    92 kg    Intake/Output Summary (Last 24 hours) at 01/15/2023 0741 Last data filed at 01/14/2023 0943 Gross per 24 hour  Intake 200 ml  Output --  Net 200 ml      01/15/2023    6:38 AM 01/12/2023    4:16 AM 01/11/2023    4:09 AM  Last 3 Weights  Weight (lbs) 202 lb 13.2 oz 205 lb 4 oz 214 lb 8.1 oz  Weight (kg) 92 kg 93.1 kg 97.3 kg      Telemetry    Afib with NSVT - Personally Reviewed   Physical Exam   GEN: No acute distress.   Neck: No JVD Cardiac: irregular  Respiratory: Clear to auscultation bilaterally. GI: Soft, nontender, non-distended  MS: No edema Neuro:  Nonfocal  Psych: Normal affect   Labs     Chemistry Recent Labs  Lab 01/10/23 2124 01/11/23 0422 01/12/23 0544 01/13/23 0420 01/13/23 1053 01/14/23 0530  NA 139 141 142 138 139  139 139  K 4.2 4.0 3.7 3.4* 4.1  4.1 4.0  CL 108 108 106 102  --  106  CO2 20* 25 26 26   --  26   GLUCOSE 86 83 79 87  --  89  BUN 13 14 14 18   --  19  CREATININE 1.49* 1.57* 1.78* 1.82*  --  1.83*  CALCIUM 8.9 9.1 9.3 9.0  --  9.2  MG  --  1.7  --   --   --  1.9  PROT 5.9*  --   --   --   --   --   ALBUMIN 3.2*  --   --   --   --   --   AST 11*  --   --   --   --   --   ALT 8  --   --   --   --   --   ALKPHOS 55  --   --   --   --   --   BILITOT 0.8  --   --   --   --   --   GFRNONAA 47* 44* 38* 37*  --  36*  ANIONGAP  11 8 10 10   --  7     Hematology Recent Labs  Lab 01/13/23 0420 01/13/23 1053 01/14/23 0530 01/15/23 0308  WBC 5.2  --  5.5 6.4  RBC 3.54*  --  3.38* 3.30*  HGB 10.9* 11.2*  11.2* 10.3* 10.1*  HCT 33.5* 33.0*  33.0* 32.3* 31.4*  MCV 94.6  --  95.6 95.2  MCH 30.8  --  30.5 30.6  MCHC 32.5  --  31.9 32.2  RDW 13.9  --  13.8 13.9  PLT 144*  --  141* 136*   Thyroid  Recent Labs  Lab 01/11/23 0422  TSH 2.296      Radiology    ECHO TEE  Result Date: 01/14/2023    TRANSESOPHOGEAL ECHO REPORT   Patient Name:   Brandon [name] Date of Exam: 01/14/2023 Medical Rec #:  [id number]       Height:       71.0 in Accession #:    [id number]      Weight:       205.2 lb Date of Birth:  [birth year]      BSA:          2.132 m Patient Age:    82 years        BP:           102/87 mmHg Patient Gender: M               HR:           78 bpm. Exam Location:  Inpatient Procedure: Transesophageal Echo, Color Doppler and Cardiac Doppler Indications:     Atrial Fibrillation  History:         Patient has prior history of Echocardiogram examinations, most                  recent 01/11/2023. Cardiomyopathy, Arrythmias:RBBB and PVC,                  Signs/Symptoms:Shortness of Breath; Risk Factors:Sleep Apnea                  and Hypertension. CKD.  Sonographer:     Lucendia Herrlich RCS Sonographer#2:   Delcie Roch RDCS Referring Phys:  [id number] Jonita Albee Diagnosing Phys: Thurmon Fair MD PROCEDURE: After discussion of the risks and benefits of a TEE, an informed consent  was obtained from the patient. The transesophogeal probe was passed without difficulty through the esophogus of the patient. Imaged were obtained with the patient in a left lateral decubitus position. Local oropharyngeal anesthetic was provided with Cetacaine. Sedation performed by different physician. The patient was monitored while under deep sedation. Anesthestetic sedation was provided intravenously by Anesthesiology: 173mg  of Propofol, 50mg  of Lidocaine. The patient developed no complications during the procedure.  IMPRESSIONS  1. Left ventricular ejection fraction, by estimation, is 20 to 25%. The left ventricle has severely decreased function. The left ventricle demonstrates global hypokinesis. The left ventricular internal cavity size was severely dilated. Left ventricular diastolic function could not be evaluated.  2. Right ventricular systolic function is mildly reduced. The right ventricular size is mildly enlarged. There is normal pulmonary artery systolic pressure. The estimated right ventricular systolic pressure is 24.6 mmHg.  3. There is a 9 mm x 4 mm mobile thrombus in the mid left atrial appendage. Left atrial size was severely dilated. A left atrial/left atrial appendage thrombus was detected.  4. Right atrial size was moderately dilated.  5. The mitral valve  is normal in structure. Mild to moderate mitral valve regurgitation.  6. The aortic valve is tricuspid. Aortic valve regurgitation is trivial. No aortic stenosis is present. FINDINGS  Left Ventricle: Left ventricular ejection fraction, by estimation, is 20 to 25%. The left ventricle has severely decreased function. The left ventricle demonstrates global hypokinesis. The left ventricular internal cavity size was severely dilated. Left  ventricular diastolic function could not be evaluated due to atrial fibrillation. Left ventricular diastolic function could not be evaluated. Right Ventricle: The right ventricular size is mildly enlarged. No  increase in right ventricular wall thickness. Right ventricular systolic function is mildly reduced. There is normal pulmonary artery systolic pressure. The tricuspid regurgitant velocity  is 2.27 m/s, and with an assumed right atrial pressure of 4 mmHg, the estimated right ventricular systolic pressure is 24.6 mmHg. Left Atrium: There is a 9 mm x 4 mm mobile thrombus in the mid left atrial appendage. Left atrial size was severely dilated. Spontaneous echo contrast was present in the left atrium. A left atrial/left atrial appendage thrombus was detected. Right Atrium: Right atrial size was moderately dilated. Pericardium: There is no evidence of pericardial effusion. Mitral Valve: The mitral valve is normal in structure. Mild to moderate mitral valve regurgitation, with centrally-directed jet. Tricuspid Valve: The tricuspid valve is normal in structure. Tricuspid valve regurgitation is trivial. Aortic Valve: The aortic valve is tricuspid. Aortic valve regurgitation is trivial. No aortic stenosis is present. Pulmonic Valve: The pulmonic valve was normal in structure. Pulmonic valve regurgitation is not visualized. No evidence of pulmonic stenosis. Aorta: The aortic root, ascending aorta, aortic arch and descending aorta are all structurally normal, with no evidence of dilitation or obstruction. There is minimal (Grade I) plaque. IAS/Shunts: No atrial level shunt detected by color flow Doppler.  TRICUSPID VALVE TR Peak grad:   20.6 mmHg TR Vmax:        227.00 cm/s Thurmon Fair MD Electronically signed by Thurmon Fair MD Signature Date/Time: 01/14/2023/12:32:37 PM    Final    EP STUDY  Result Date: 01/14/2023 See surgical note for result.  CARDIAC CATHETERIZATION  Result Date: 01/13/2023 HEMODYNAMICS: RA:   5 mmHg (mean) RV:   40/3-3-5 mmHg PA:   39/24 mmHg (30 mean) PCWP:  14 mmHg (mean)    Estimated Fick CO/CI   5.2 L/min, 2.4 L/min/m2 Thermodilution CO/CI   4.3 L/min, 2 L/min/m2    TPG    16  mmHg      PVR     3-3.7 Wood Units PAPi      3  IMPRESSION: Normal pre and post capillary filling pressures. 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 Mild to moderately reduced cardiac output / index; reduced by thermodilution. Aditya Sabharwal Advanced Heart Failure 11:10 AM    Patient Profile     82 y.o. male with past medical history of cardiomyopathy, prior CVA, hypertension, obstructive sleep apnea, chronic stage III kidney disease admitted with newly diagnosed atrial fibrillation.  Echocardiogram this admission showed ejection fraction 20 to 25%, severe left ventricular enlargement, moderate right ventricular enlargement, severe left atrial enlargement, moderate right atrial enlargement, moderate to severe mitral regurgitation.  Right heart catheterization October 31 showed normal pre and postcapillary filling pressures.  Transesophageal echocardiogram on November 1 showed ejection fraction 20 to 25%, mild right ventricular enlargement, left atrial appendage thrombus, moderate left atrial enlargement, mild to moderate mitral regurgitation.  Assessment & Plan    1 acute on  chronic systolic congestive heart failure-patient appears to be euvolemic on examination.  Will resume Lasix 20 mg daily at discharge.  Continue Jardiance.  Will hold on spironolactone for now given renal insufficiency.  Check potassium and renal function 1 week following discharge.  2 persistent atrial fibrillation-patient remains in atrial fibrillation.  Transesophageal echocardiogram yesterday showed left atrial appendage thrombus.  Cardioversion was therefore canceled.  Plan to continue apixaban and likely repeat TEE cardioversion in 6 to 8 weeks.  Continue Toprol for rate control.  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  4 mitral regurgitation-mild to moderate on transesophageal echocardiogram.  5  hypertension-patient's blood pressure is borderline.  Will follow.  6 cardiomyopathy-will continue Toprol and Jardiance.  Will consider addition of ARB or Entresto as an outpatient if blood pressure allows and renal function stable.  Could also consider spironolactone at that time.  Plan discharge today.  Will arrange follow-up with APP in 1 week.  Follow-up Dr. Antoine Poche 4 to 6 weeks.  Will need to repeat TEE cardioversion as an outpatient in 6 to 8 weeks after anticoagulation.  Discharge home on present medications with the addition of Lasix 20 mg daily.  Greater than 30 minutes PA and physician time. D2  For questions or updates, please contact [facility] Please consult [email or web address] for contact info under        Signed, Olga Millers, MD  01/15/2023, 7:41 AM

## 2023-01-17 ENCOUNTER — Encounter (HOSPITAL_COMMUNITY): Payer: Self-pay | Admitting: Cardiovascular Disease

## 2023-01-17 ENCOUNTER — Telehealth: Payer: Self-pay | Admitting: *Deleted

## 2023-01-17 ENCOUNTER — Other Ambulatory Visit (HOSPITAL_COMMUNITY): Payer: Self-pay

## 2023-01-17 NOTE — Transitions of Care (Post Inpatient/ED Visit) (Signed)
01/17/2023  Name: Brandon [name] MRN: [id number] DOB: [birth year]  Today's TOC FU Call Status: Today's TOC FU Call Status:: Successful TOC FU Call Completed TOC FU Call Complete Date: 01/17/23 Patient's Name and Date of Birth confirmed.  Transition Care Management Follow-up Telephone Call Date of Discharge: 01/15/23 Discharge Facility: [facility]) Type of Discharge: Inpatient Admission Primary Inpatient Discharge Diagnosis:: Acute on chronic CHF How have you been since you were released from the hospital?: Better ("I am doing okay, I am better.  Taking the medicine like they told me to; weighing myself everyday, and limiting my salt.  I am doing okay") Any questions or concerns?: No  Items Reviewed: Did you receive and understand the discharge instructions provided?: Yes (thoroughly reviewed with patient who verbalizes good understanding of same) Medications obtained,verified, and reconciled?: Yes (Medications Reviewed) (Full medication reconciliation/ review completed; no concerns or discrepancies identified; confirmed patient obtained/ is taking all newly Rx'd medications as instructed; self-manages medications and denies questions/ concerns around medications today) Any new allergies since your discharge?: No Dietary orders reviewed?: Yes Type of Diet Ordered:: Heart Healthy, low salt Do you have support at home?: Yes People in Home: spouse Name of Support/Comfort Primary Source: Reports independent in self-care activities; supportive spouse assists as/ if needed/ indicated  Medications Reviewed Today: Medications Reviewed Today     Reviewed by Michaela Corner, RN (Registered Nurse) on 01/17/23 at 1441  Med List Status: <None>   Medication Order Taking? Sig Documenting Provider Last Dose Status Informant  albuterol (PROVENTIL) (2.5 MG/3ML) 0.083% nebulizer solution [id number] Yes TAKE 3 MLS BY NEBULIZER EVERY 6 HOURS AS NEEDED FOR WHEEZING FOR SHORTNESS OF BREATH  Patient  taking differently: Take 2.5 mg by nebulization every 6 (six) hours as needed for shortness of breath or wheezing.   [name], NP Taking Active Self  albuterol (VENTOLIN HFA) 108 (90 Base) MCG/ACT inhaler [id number] Yes Inhale 1-2 puffs into the lungs every 6 (six) hours as needed for wheezing or shortness of breath. [name], NP Taking Active   apixaban (ELIQUIS) 2.5 MG TABS tablet [id number] Yes Take 1 tablet (2.5 mg total) by mouth 2 (two) times daily. Corrin Parker, PA-C Taking Active   cyanocobalamin (VITAMIN B12) 1000 MCG tablet [id number] Yes Take 1 tablet (1,000 mcg total) by mouth daily. [name], NP Taking Active   empagliflozin (JARDIANCE) 10 MG TABS tablet [id number] Yes Take 1 tablet (10 mg total) by mouth daily. Corrin Parker, PA-C Taking Active   fluticasone-salmeterol (ADVAIR) 500-50 MCG/ACT AEPB [id number] Yes Inhale 1 puff into the lungs in the morning and at bedtime. [name], NP Taking Active   folic acid (FOLVITE) 1 MG tablet [id number] Yes Take 1 tablet (1 mg total) by mouth daily. [name], NP Taking Active   furosemide (LASIX) 20 MG tablet [id number] Yes Take 1 tablet (20 mg total) by mouth daily. [name], NP Taking Active   gabapentin (NEURONTIN) 300 MG capsule [id number] Yes Take 1 capsule (300 mg total) by mouth at bedtime. [name], NP Taking Active   leflunomide (ARAVA) 20 MG tablet [id number] Yes Take 20 mg by mouth daily. [provider] Taking Active Self  metoprolol succinate (TOPROL-XL) 25 MG 24 hr tablet [id number] Yes Take 1 tablet (25 mg total) by mouth daily. Corrin Parker, PA-C Taking Active   montelukast (SINGULAIR) 10 MG tablet [id number] Yes Take 1 tablet (10 mg total) by mouth at bedtime. [name], NP Taking Active   omeprazole (PRILOSEC)  20 MG capsule [id number] Yes Take 1 capsule (20 mg total) by mouth daily. [name], MD Taking Active   rosuvastatin  (CRESTOR) 10 MG tablet [id number] Yes Take 1 tablet (10 mg total) by mouth daily. Wanda Plump, MD Taking Active   tamsulosin [facility]) 0.4 MG CAPS capsule [id number] Yes Take 1 capsule by mouth once daily [name], NP Taking Active             Home Care and Equipment/Supplies: Were Home Health Services Ordered?: No Any new equipment or medical supplies ordered?: No  Functional Questionnaire: Do you need assistance with bathing/showering or dressing?: No Do you need assistance with meal preparation?: No Do you need assistance with eating?: No Do you have difficulty maintaining continence: No Do you need assistance with getting out of bed/getting out of a chair/moving?: No Do you have difficulty managing or taking your medications?: No  Follow up appointments reviewed: PCP Follow-up appointment confirmed?: Yes Date of PCP follow-up appointment?: 01/25/23 (verified this is recommended time frame for follow up per hospital discharging provider notes) Follow-up Provider: PCP Specialist Hospital Follow-up appointment confirmed?: Yes Date of Specialist follow-up appointment?: 01/28/23 (verified this is recommended time frame for follow up per hospital discharging provider notes) Follow-Up Specialty Provider:: CHF clinic provider 01/28/23;  cardiology provider 02/17/23 (verified this is recommended time frame for follow up per hospital discharging provider notes) Do you need transportation to your follow-up appointment?: No Do you understand care options if your condition(s) worsen?: Yes-patient verbalized understanding  SDOH Interventions Today    Flowsheet Row Most Recent Value  SDOH Interventions   Food Insecurity Interventions Intervention Not Indicated  Transportation Interventions Intervention Not Indicated  [drives self,  wife assists as indicated/ needed]       Goals Addressed             This Visit's Progress    TOC 30-day Program Care Plan   On track     Current Barriers:  Ongoing management of CHF at home, in setting of new onset of rate controlled A-Fib  RNCM Clinical Goal(s):  Patient will work with the Care Management team over the next 30 days to address Transition of Care Barriers: ongoing self- health management of multiple chronic disease states attend all scheduled medical appointments: CHF clinic, cardiology, PCP as evidenced by review of same in EHR and with patient during RN CM outreach visits not experience hospital admission as evidenced by review of EMR. Hospital Admissions in last 6 months = 1  through collaboration with RN Care manager, provider, and care team.   Interventions: Evaluation of current treatment plan related to  self management and patient's adherence to plan as established by provider  Transitions of Care:  New goal. 01/17/23 Durable Medical Equipment (DME) confirmed patient does not currently require use of assistive devices for ambulation; confirmed he has scales at home and is currently adherent in monitoring/ recording daily weights at home Doctor Visits  - discussed the importance of doctor visits Post discharge activity limitations prescribed by provider reviewed Reinforced rationale for daily weight monitoring at home along with weight gain guidelines/ action plan for weight gain; importance of taking diuretic as prescribed  Reviewed recent daily weights at home with patient: he reports general weight ranges "right at 200-202 lbs" Reinforced/ provided education around basics of following heart healthy low salt diet Full medication review with updating medication list in EHR per patient report   Patient Goals/Self-Care Activities: Participate in Transition of Care Program/Attend TOC  scheduled calls Take all medications as prescribed Attend all scheduled provider appointments Call provider office for new concerns or questions  call office if I gain more than 2 pounds in one day or 5 pounds in one  week track weight in diary use salt in moderation weigh myself daily know when to call the doctor: for any new or worsening shortness of breath or other concerns with your health; for overnight weight gain more than 3 lbs, or 5 lbs in one week   Follow Up Plan:   Telephone follow up appointment with care management team member scheduled for:  Thursday, 01/27/23 at 1:15 pm The patient has been provided with contact information for the care management team and has been advised to call with any health related questions or concerns.  Next PCP appointment scheduled for:  Tuesday, 01/25/23  Caryl Pina, RN, BSN, CCRN Alumnus RN Care Manager  Transitions of Care  [facility]  [place] [phone]: direct office         Caryl Pina, RN, BSN, CCRN Alumnus RN Care Manager  Transitions of Care  [facility] - [facility] [phone]: direct office

## 2023-01-17 NOTE — Patient Instructions (Signed)
Visit Information  Thank you for taking time to visit with me today. Please don't hesitate to contact me if I can be of assistance to you before our next scheduled telephone appointment.  Our next appointment is by telephone on Thursday 01/27/23 at 1:15 pm  Patient goals: Participate in Transition of Care Program/Attend TOC scheduled calls Take all medications as prescribed Attend all scheduled provider appointments Call provider office for new concerns or questions  call office if I gain more than 2 pounds in one day or 5 pounds in one week track weight in diary use salt in moderation weigh myself daily know when to call the doctor: for any new or worsening shortness of breath or other concerns with your health; for overnight weight gain more than 3 lbs, or 5 lbs in one week   Caryl Pina, RN, BSN, Media planner  Transitions of Care  [facility] - Population [facility]  [place] [phone]: direct office  Following is a copy of your care plan:   Goals Addressed             This Visit's Progress    TOC 30-day Program Care Plan   On track    Current Barriers:  Ongoing management of CHF at home, in setting of new onset of rate controlled A-Fib  RNCM Clinical Goal(s):  Patient will work with the Care Management team over the next 30 days to address Transition of Care Barriers: ongoing self- health management of multiple chronic disease states attend all scheduled medical appointments: CHF clinic, cardiology, PCP as evidenced by review of same in EHR and with patient during RN CM outreach visits not experience hospital admission as evidenced by review of EMR. Hospital Admissions in last 6 months = 1  through collaboration with RN Care manager, provider, and care team.   Interventions: Evaluation of current treatment plan related to  self management and patient's adherence to plan as established by provider  Transitions of Care:  New goal. 01/17/23 Durable  Medical Equipment (DME) confirmed patient does not currently require use of assistive devices for ambulation; confirmed he has scales at home and is currently adherent in monitoring/ recording daily weights at home Doctor Visits  - discussed the importance of doctor visits Post discharge activity limitations prescribed by provider reviewed Reinforced rationale for daily weight monitoring at home along with weight gain guidelines/ action plan for weight gain; importance of taking diuretic as prescribed  Reviewed recent daily weights at home with patient: he reports general weight ranges "right at 200-202 lbs" Reinforced/ provided education around basics of following heart healthy low salt diet Full medication review with updating medication list in EHR per patient report   Patient Goals/Self-Care Activities: Participate in Transition of Care Program/Attend TOC scheduled calls Take all medications as prescribed Attend all scheduled provider appointments Call provider office for new concerns or questions  call office if I gain more than 2 pounds in one day or 5 pounds in one week track weight in diary use salt in moderation weigh myself daily know when to call the doctor: for any new or worsening shortness of breath or other concerns with your health; for overnight weight gain more than 3 lbs, or 5 lbs in one week   Follow Up Plan:   Telephone follow up appointment with care management team member scheduled for:  Thursday, 01/27/23 at 1:15 pm The patient has been provided with contact information for the care management team and has been  advised to call with any health related questions or concerns.  Next PCP appointment scheduled for:  Tuesday, 01/25/23  Caryl Pina, RN, BSN, CCRN Alumnus RN Care Manager  Transitions of Care  [facility]  [place] [phone]: direct office        Patient verbalizes understanding of instructions and care plan provided  today and agrees to view in MyChart. Active MyChart status and patient understanding of how to access instructions and care plan via MyChart confirmed with patient.     Telephone follow up appointment with care management team member scheduled for: 01/27/23  Please call the care guide team at [phone] if you need to cancel or reschedule your appointment.   Please if you are experiencing a Mental Health or Behavioral Health Crisis or need someone to talk to:  call the Suicide and Crisis Lifeline: 988 call the [place] National Suicide Prevention Lifeline: (705)267-2720 or TTY: (754)265-0836 TTY 607-382-2543) to talk to a trained counselor call 1-800-273-TALK (toll free, 24 hour hotline) go to [facility] [address] [place] [phone]) call the [facility] Crisis Line: [phone] call 911

## 2023-01-21 ENCOUNTER — Other Ambulatory Visit: Payer: Self-pay

## 2023-01-21 DIAGNOSIS — N189 Chronic kidney disease, unspecified: Secondary | ICD-10-CM | POA: Diagnosis not present

## 2023-01-21 DIAGNOSIS — N179 Acute kidney failure, unspecified: Secondary | ICD-10-CM | POA: Diagnosis not present

## 2023-01-22 LAB — BASIC METABOLIC PANEL
BUN/Creatinine Ratio: 11 (ref 10–24)
BUN: 22 mg/dL (ref 8–27)
CO2: 20 mmol/L (ref 20–29)
Calcium: 9.3 mg/dL (ref 8.6–10.2)
Chloride: 108 mmol/L — ABNORMAL HIGH (ref 96–106)
Creatinine, Ser: 2 mg/dL — ABNORMAL HIGH (ref 0.76–1.27)
Glucose: 90 mg/dL (ref 70–99)
Potassium: 4.4 mmol/L (ref 3.5–5.2)
Sodium: 143 mmol/L (ref 134–144)
eGFR: 33 mL/min/{1.73_m2} — ABNORMAL LOW (ref >59)

## 2023-01-25 ENCOUNTER — Ambulatory Visit: Payer: Medicare Other | Admitting: Family

## 2023-01-25 VITALS — BP 110/83 | HR 61 | Temp 98.2°F | Resp 16 | Ht 71.0 in | Wt 207.0 lb

## 2023-01-25 DIAGNOSIS — I4891 Unspecified atrial fibrillation: Secondary | ICD-10-CM | POA: Diagnosis not present

## 2023-01-25 DIAGNOSIS — I10 Essential (primary) hypertension: Secondary | ICD-10-CM | POA: Diagnosis not present

## 2023-01-25 DIAGNOSIS — E538 Deficiency of other specified B group vitamins: Secondary | ICD-10-CM

## 2023-01-25 DIAGNOSIS — N1831 Chronic kidney disease, stage 3a: Secondary | ICD-10-CM | POA: Diagnosis not present

## 2023-01-25 DIAGNOSIS — E785 Hyperlipidemia, unspecified: Secondary | ICD-10-CM | POA: Diagnosis not present

## 2023-01-25 DIAGNOSIS — I5023 Acute on chronic systolic (congestive) heart failure: Secondary | ICD-10-CM | POA: Diagnosis not present

## 2023-01-25 DIAGNOSIS — I513 Intracardiac thrombosis, not elsewhere classified: Secondary | ICD-10-CM | POA: Diagnosis not present

## 2023-01-25 DIAGNOSIS — J45909 Unspecified asthma, uncomplicated: Secondary | ICD-10-CM

## 2023-01-25 DIAGNOSIS — N401 Enlarged prostate with lower urinary tract symptoms: Secondary | ICD-10-CM

## 2023-01-25 NOTE — Assessment & Plan Note (Signed)
Clinically euvolemic.  Continue furosemide 20mg  once daily and daily weight checks.

## 2023-01-25 NOTE — Progress Notes (Signed)
Subjective:     Patient ID: Brandon [name], male    DOB: 11-18-1940, 82 y.o.   MRN: [id number]  Chief Complaint  Patient presents with   Hypertension    Here for follow up    Hypertension    Discussed the use of AI scribe software for clinical note transcription with the patient, who gave verbal consent to proceed.  History of Present Illness    Patient is an 82 yr old male who presents today for hospital follow up.  He was admitted 10/28-11/2/24 with new onset AF and Acute CHF exacerbation. TEE noted LVEF 20-25%. H underwent a Right heart cath on 01/13/23 which noted mildly elevated PA mean and PVR.  At discharged he was restarted on his home lasix 20mg .  Spironolacton and Entresto were held at discharge due to soft BP.  Toprol xl was decreased to 25mg  once daily and he was placed on jardiance.  He went for TEE/DCCV on 11/1 but noted was made of a 9mm x 4 mm left atrial thrombus so DCCV was not performed.  He was discharged home on renal dose of Eliquis and Toprol xl.  aspirin was discontinued.   His breathing is currently described as 'moderate,' and he has been using albuterol as needed. He has not needed to use his nebulizer. He is also on Advair and Singulair for his asthma, which he reports is helpful. He has been taking omeprazole daily for reflux, which has been effective.  The patient also reports using a CPAP machine and taking a B12 supplement. He has been emptying his bladder adequately with Flomax, although he reports waking up three times a night to urinate. He has received his flu shot and COVID booster.     Wt Readings from Last 3 Encounters:  01/25/23 207 lb (93.9 kg)  01/15/23 202 lb 13.2 oz (92 kg)  01/10/23 223 lb 12.8 oz (101.5 kg)    Lab Results  Component Value Date   HGBA1C 5.1 04/18/2022   HGBA1C 5.5 11/03/2021   HGBA1C 6.3 11/07/2018   Lab Results  Component Value Date   LDLCALC 60 09/21/2022   CREATININE 2.00 (H) 01/21/2023        Health  Maintenance Due  Topic Date Due   Zoster Vaccines- Shingrix (2 of 2) 06/10/2021   COVID-19 Vaccine (7 - 2023-24 season) 11/14/2022    Past Medical History:  Diagnosis Date   Anemia    Asthma    Hx of childhood asthma, disappeared for a while, then resurfaced 6-7 years ago.    Cardiomyopathy    with a negative cardiac catheterization in the past. 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     Past Surgical History:  Procedure Laterality Date   None     RIGHT HEART CATH N/A 01/13/2023   Procedure: RIGHT HEART CATH;  Surgeon: Dorthula Nettles, DO;  Location: [facility] INVASIVE CV LAB;  Service: Cardiovascular;  Laterality: N/A;   TRANSESOPHAGEAL ECHOCARDIOGRAM (CATH LAB) N/A 01/14/2023   Procedure: TRANSESOPHAGEAL ECHOCARDIOGRAM;  Surgeon: Thurmon Fair, MD;  Location: [facility] INVASIVE CV LAB;  Service: Cardiovascular;  Laterality: N/A;    Family History  Problem Relation Age of Onset   Multiple myeloma Father    Lupus Sister  died at 73   Diabetes Mellitus II Sister        died from covid-19   Neuropathy Sister    Asthma Daughter    Colon cancer Neg Hx    Esophageal cancer Neg Hx    Stomach cancer Neg Hx    Pancreatic cancer Neg Hx     Social History   Socioeconomic History   Marital status: Married    Spouse name: [name]   Number of children: 3   Years of education: Not on file   Highest education level: Bachelor's degree (e.g., BA, AB, BS)  Occupational History   Occupation: retired  Tobacco Use   Smoking status: Former   Smokeless tobacco: Never   Tobacco comments:    quit smoling in 1989/02/22. started when 18, 1 ppd  Vaping Use   Vaping status: Never Used  Substance and Sexual Activity   Alcohol use: Not Currently    Comment: occ   Drug use: Never   Sexual  activity: Not on file  Other Topics Concern   Not on file  Social History Narrative   Grew up in [place], attended [employer]. First wife died of breast ca in February 22, 1998. 3 children. Remarried- 8 years. 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    Pt signed designated party release granting access to [facility] to his wife [name]. Detailed message may be left on home or cell phone. Roselle Locus August 13, 2009 11:34 am.    Social Determinants of Health   Financial Resource Strain: Low Risk  (01/13/2023)   Overall Financial Resource Strain (CARDIA)    Difficulty of Paying Living Expenses: Not hard at all  Food Insecurity: No Food Insecurity (01/17/2023)   Hunger Vital Sign    Worried About Running Out of Food in the Last Year: Never true    Ran Out of Food in the Last Year: Never true  Transportation Needs: No Transportation Needs (01/17/2023)   PRAPARE - Administrator, Civil Service (Medical): No    Lack of Transportation (Non-Medical): No  Physical Activity: Insufficiently Active (07/25/2020)   Exercise Vital Sign    Days of Exercise per Week: 5 days    Minutes of Exercise per Session: 10 min  Stress: No Stress Concern Present (05/28/2021)   [employer] of Occupational Health - Occupational Stress Questionnaire    Feeling of Stress : Not at all  Social Connections: Socially Integrated (05/28/2021)   Social Connection and Isolation Panel [NHANES]    Frequency of Communication with Friends and Family: More than three times a week    Frequency of Social Gatherings with Friends and Family: Three times a week    Attends Religious Services: More than 4 times per year    Active Member of Clubs or Organizations: Yes    Attends Banker Meetings: More than 4 times per year    Marital Status: Married  Catering manager Violence: Not At Risk (04/19/2022)   Humiliation, Afraid, Rape, and Kick questionnaire    Fear of Current or Ex-Partner: No     Emotionally Abused: No    Physically Abused: No    Sexually Abused: No    Outpatient Medications Prior to Visit  Medication Sig Dispense Refill   albuterol (PROVENTIL) (2.5 MG/3ML) 0.083% nebulizer solution TAKE 3 MLS BY NEBULIZER EVERY 6 HOURS AS NEEDED FOR WHEEZING FOR SHORTNESS OF BREATH (Patient taking differently: Take 2.5 mg by nebulization every 6 (six) hours as needed  for shortness of breath or wheezing.) 150 mL 3   albuterol (VENTOLIN HFA) 108 (90 Base) MCG/ACT inhaler Inhale 1-2 puffs into the lungs every 6 (six) hours as needed for wheezing or shortness of breath. 18 g 5   apixaban (ELIQUIS) 2.5 MG TABS tablet Take 1 tablet (2.5 mg total) by mouth 2 (two) times daily. 60 tablet 2   cyanocobalamin (VITAMIN B12) 1000 MCG tablet Take 1 tablet (1,000 mcg total) by mouth daily.     empagliflozin (JARDIANCE) 10 MG TABS tablet Take 1 tablet (10 mg total) by mouth daily. 30 tablet 2   fluticasone-salmeterol (ADVAIR) 500-50 MCG/ACT AEPB Inhale 1 puff into the lungs in the morning and at bedtime. 60 each 6   folic acid (FOLVITE) 1 MG tablet Take 1 tablet (1 mg total) by mouth daily.     furosemide (LASIX) 20 MG tablet Take 1 tablet (20 mg total) by mouth daily. 30 tablet 3   gabapentin (NEURONTIN) 300 MG capsule Take 1 capsule (300 mg total) by mouth at bedtime. 90 capsule 1   leflunomide (ARAVA) 20 MG tablet Take 20 mg by mouth daily.     metoprolol succinate (TOPROL-XL) 25 MG 24 hr tablet Take 1 tablet (25 mg total) by mouth daily.     montelukast (SINGULAIR) 10 MG tablet Take 1 tablet (10 mg total) by mouth at bedtime. 90 tablet 1   omeprazole (PRILOSEC) 20 MG capsule Take 1 capsule (20 mg total) by mouth daily. 90 capsule 3   rosuvastatin (CRESTOR) 10 MG tablet Take 1 tablet (10 mg total) by mouth daily. 90 tablet 1   tamsulosin (FLOMAX) 0.4 MG CAPS capsule Take 1 capsule by mouth once daily 90 capsule 0   No facility-administered medications prior to visit.    Allergies  Allergen  Reactions   Ace Inhibitors Cough   Isosorb Dinitrate-Hydralazine Other (See Comments)    REACTION: dizziness/hypotensive    ROS See HPI    Objective:    Physical Exam Constitutional:      General: He is not in acute distress.    Appearance: He is well-developed.  HENT:     Head: Normocephalic and atraumatic.  Cardiovascular:     Rate and Rhythm: Normal rate. Rhythm irregular.     Heart sounds: No murmur heard. Pulmonary:     Effort: Pulmonary effort is normal. No respiratory distress.     Breath sounds: Normal breath sounds. No wheezing or rales.  Musculoskeletal:        General: No swelling.  Skin:    General: Skin is warm and dry.  Neurological:     Mental Status: He is alert and oriented to person, place, and time.  Psychiatric:        Behavior: Behavior normal.        Thought Content: Thought content normal.      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 Plan is for DCCV in 6-8 weeks per cardiology.       Stage 3a chronic kidney disease (HCC)    Lab Results  Component Value Date   CREATININE 2.00 (H) 01/21/2023   Pt is advised to keep his upcoming appointment with nephrology.        New onset atrial fibrillation (HCC)    Rate stable. Continue metoprolol, Eliquis, follow up as scheduled with cardiology.       Hyperlipidemia    Lab Results  Component Value Date   CHOL 129 09/21/2022   HDL 53.30 09/21/2022   LDLCALC 60 09/21/2022   TRIG 78.0 09/21/2022   CHOLHDL 2  09/21/2022   LDL at goal, continue crestor.       Essential hypertension    BP stable. Does not appear high enough to allow for restart of spironolactone or Entresto.       BPH (benign prostatic hyperplasia)    Stable on flomax. Continue same.       B12 deficiency    Continues oral b12 supplement.       Asthma    Stable on advair, singulair and prn albuterol.       Acute on chronic systolic heart failure (HCC)    Clinically euvolemic.  Continue furosemide 20mg  once daily and daily weight checks.         I am having Yuriy E. [name] maintain his leflunomide, albuterol, fluticasone-salmeterol, cyanocobalamin, montelukast, omeprazole, albuterol, rosuvastatin, gabapentin, tamsulosin, furosemide, folic acid, empagliflozin, apixaban, and metoprolol succinate.  No orders of the defined types were placed in this encounter.

## 2023-01-25 NOTE — Assessment & Plan Note (Signed)
Rate stable. Continue metoprolol, Eliquis, follow up as scheduled with cardiology.

## 2023-01-25 NOTE — Assessment & Plan Note (Signed)
Lab Results  Component Value Date   CHOL 129 09/21/2022   HDL 53.30 09/21/2022   LDLCALC 60 09/21/2022   TRIG 78.0 09/21/2022   CHOLHDL 2 09/21/2022   LDL at goal, continue crestor.

## 2023-01-25 NOTE — Assessment & Plan Note (Signed)
Stable on flomax. Continue same.  

## 2023-01-25 NOTE — Assessment & Plan Note (Signed)
Continues oral b12 supplement.

## 2023-01-25 NOTE — Assessment & Plan Note (Signed)
Lab Results  Component Value Date   CREATININE 2.00 (H) 01/21/2023   Pt is advised to keep his upcoming appointment with nephrology.

## 2023-01-25 NOTE — Patient Instructions (Signed)
VISIT SUMMARY:  You were admitted to the hospital due to shortness of breath and leg swelling. During your stay, you received treatment for fluid overload but had to stop some medications due to concerns about your kidney function and low blood pressure. You were also found to have a clot in your heart and started on a blood thinner. Your breathing is currently moderate, and you are using your asthma medications effectively. You are also managing your reflux and prostate symptoms well. You have received your flu shot and COVID booster.  YOUR PLAN:  -CONGESTIVE HEART FAILURE: Congestive heart failure means your heart is not pumping blood as well as it should, leading to fluid buildup. You were recently hospitalized for this and had some kidney issues. Continue following the hospital's discharge instructions and keep your heart failure clinic appointment on 01/28/2023.  -ATRIAL FIBRILLATION: Atrial fibrillation is an irregular heartbeat that can lead to blood clots. You were found to have a clot in your heart and started on Eliquis, a blood thinner. Continue taking Eliquis as directed and follow up with cardiology as scheduled.  -ASTHMA: Asthma is a condition where your airways narrow and swell, making it hard to breathe. Your current medications, including Advair, Singulair, and Albuterol as needed, are keeping your asthma stable. Continue with your current regimen.  -GASTROESOPHAGEAL REFLUX DISEASE: Gastroesophageal reflux disease (GERD) is when stomach acid frequently flows back into the tube connecting your mouth and stomach. Your daily Omeprazole is managing this well. Continue taking Omeprazole daily.  -BENIGN PROSTATIC HYPERPLASIA: Benign prostatic hyperplasia (BPH) is an enlarged prostate gland that can cause urinary issues. You are currently taking Flomax, which is helping with your symptoms. Continue taking Flomax as directed.  -GENERAL HEALTH MAINTENANCE: You are doing well with your general  health maintenance. Continue taking your B12 supplement and note that you have received your flu shot and COVID booster. Follow up in three months or sooner if needed.  INSTRUCTIONS:  Keep your heart failure clinic appointment on 01/28/2023. Continue taking Eliquis as directed and follow up with cardiology as scheduled. Follow up in three months or sooner if needed.

## 2023-01-25 NOTE — Assessment & Plan Note (Signed)
Stable on advair, singulair and prn albuterol.

## 2023-01-25 NOTE — Assessment & Plan Note (Signed)
Patient is being treated with Eliquis 2.5 mg. Plan is for DCCV in 6-8 weeks per cardiology.

## 2023-01-25 NOTE — Assessment & Plan Note (Signed)
BP stable. Does not appear high enough to allow for restart of spironolactone or Entresto.

## 2023-01-27 ENCOUNTER — Other Ambulatory Visit: Payer: Self-pay | Admitting: *Deleted

## 2023-01-27 ENCOUNTER — Telehealth (HOSPITAL_COMMUNITY): Payer: Self-pay

## 2023-01-27 NOTE — Patient Instructions (Addendum)
Visit Information  Thank you for taking time to visit with me today. Please don't hesitate to contact me if I can be of assistance to you before our next scheduled telephone appointment.  Following are the goals we discussed today:   Participate in Transition of Care Program/Attend TOC scheduled calls Take all medications as prescribed Attend all scheduled provider appointments Call provider office for new concerns or questions  call office if I gain more than 2 pounds in one day or 5 pounds in one week track weight in diary use salt in moderation weigh myself daily know when to call the doctor: for any new or worsening shortness of breath or other concerns with your health; for overnight weight gain more than 3 lbs, or 5 lbs in one week   Our next appointment is by telephone on Thursday, 02/03/23 at 1:15 pm   Please call the care guide team at [phone] if you need to cancel or reschedule your appointment.   If you are experiencing a Mental Health or Behavioral Health Crisis or need someone to talk to, please call the Suicide and Crisis Lifeline: 988 call the [place] National Suicide Prevention Lifeline: 3326241417 or TTY: 641-397-1171 TTY 717-751-3335) to talk to a trained counselor call 1-800-273-TALK (toll free, 24 hour hotline) go to [facility] [address] [place] [phone]) call the [facility] Crisis Line: [phone] call 911   Patient verbalizes understanding of instructions and care plan provided today and agrees to view in MyChart. Active MyChart status and patient understanding of how to access instructions and care plan via MyChart confirmed with patient.     Caryl Pina, RN, BSN, Media planner  Transitions of Care  [facility] - [facility] [phone]: direct office

## 2023-01-27 NOTE — Telephone Encounter (Signed)
Made patient aware of appointment tomorrow in [facility] clinic. Patient aware and verbalized understanding.

## 2023-01-27 NOTE — Patient Outreach (Signed)
Care Management  Transitions of Care Program Transitions of Care Post-discharge week 2/ day #10   01/27/2023 Name: Brandon [name] MRN: [id number] DOB: 10-21-1940  Subjective: Brandon [name] is a 82 y.o. year old male who is a primary care patient of [name], NP. The Care Management team Engaged with patient Engaged with patient by telephone to assess and address transitions of care needs.   Consent to Services:  Patient was given information about care management services, agreed to services, and gave verbal consent to participate.   Enrolled 01/17/23  Assessment: "I am doing fine; went to see my PCP this week, got a good report, she did not make any changes.  Going to the heart failure clinic tomorrow.  I am driving myself and feeling good about how my recuperation is going.  Taking my medicine like they tell me to, and weighing myself at home every day.  Trying to do everything just like they told me to at the hospital"          SDOH Interventions    Flowsheet Row Patient Outreach from 01/27/2023 in [place] POPULATION HEALTH DEPARTMENT Telephone from 01/17/2023 in [place] POPULATION HEALTH DEPARTMENT Admission (Discharged) from 01/10/2023 in [facility] 4E CV SURGICAL PROGRESSIVE CARE Clinical Support from 07/01/2022 in [facility] Primary Care at [facility] Telephone from 04/20/2022 in [facility] Community Care Coordination Clinical Support from 05/28/2021 in [facility] Primary Care at [facility]  SDOH Interventions        Food Insecurity Interventions -- Intervention Not Indicated -- -- -- --  Housing Interventions -- -- Intervention Not Indicated -- -- Intervention Not Indicated  Transportation Interventions -- Intervention Not Indicated  [drives self,  wife assists as indicated/ needed] Intervention Not Indicated -- Intervention Not Indicated  [normally drives self,  wife provides transportation as needed] Intervention Not  Indicated  Alcohol Usage Interventions -- -- -- Intervention Not Indicated (Score <7) -- --  Financial Strain Interventions -- -- Intervention Not Indicated -- -- --  Stress Interventions -- -- -- -- -- Intervention Not Indicated  Social Connections Interventions -- -- -- -- -- Intervention Not Indicated  Health Literacy Interventions Intervention Not Indicated -- -- -- -- --       Goals Addressed             This Visit's Progress    TOC 30-day Program Care Plan   On track    Current Barriers:  Ongoing management of CHF at home, in setting of new onset of rate controlled A-Fib  RNCM Clinical Goal(s):  Patient will work with the Care Management team over the next 30 days to address Transition of Care Barriers: ongoing self- health management of multiple chronic disease states attend all scheduled medical appointments: CHF clinic, cardiology, PCP as evidenced by review of same in EHR and with patient during RN CM outreach visits not experience hospital admission as evidenced by review of EMR. Hospital Admissions in last 6 months = 1  through collaboration with RN Care manager, provider, and care team.   Interventions: Evaluation of current treatment plan related to  self management and patient's adherence to plan as established by provider  Transitions of Care:  Goal on track:  Yes. 01/27/23 Annual Eye Exam confirmed patient had annual eye exam in "September of 2024" Vaccines  Doctor Visits  - discussed the importance of doctor visits Post discharge activity limitations prescribed by provider reviewed Discussed current clinical condition: reports "  doing fine, not having any problems; feeling good;" denies specific clinical concerns today Reviewed provider office visit with patient/ PCP ohn 01/25/23- verbalizes good understanding of same; confirms no medication changes Reviewed upcoming provider office visits: confirmed patient is aware of CHF clinic visit 01/28/23 and has plans to  attend as scheduled Reinforced rationale for daily weight monitoring at home along with weight gain guidelines/ action plan for weight gain; importance of taking diuretic as prescribed  Reviewed recent daily weights at home with patient: he reports general weight ranges "right at 202-204 lbs" confirms he continues to be adherent to daily weight monitoring at home Reinforced/ provided education around basics of following heart healthy low salt diet Confirmed he has obtained flu and COVID booster vaccinations for 2024-25 winter season: states "I got the Covid booster in September and the flu vaccine in October"  Patient Goals/Self-Care Activities: Participate in Transition of Care Program/Attend [facility] scheduled calls Take all medications as prescribed Attend all scheduled provider appointments Call provider office for new concerns or questions  call office if I gain more than 2 pounds in one day or 5 pounds in one week track weight in diary use salt in moderation weigh myself daily know when to call the doctor: for any new or worsening shortness of breath or other concerns with your health; for overnight weight gain more than 3 lbs, or 5 lbs in one week   Follow Up Plan:   Telephone follow up appointment with care management team member scheduled for:  Thursday, 02/03/23 at 1:15 pm The patient has been provided with contact information for the care management team and has been advised to call with any health related questions or concerns.   Caryl Pina, RN, BSN, Media planner  Transitions of Care  [facility] - Population Health  [place] [phone]: direct office        Plan: Telephone follow up appointment with care management team member scheduled for:  Thursday, 02/03/23 at 1:15 pm  Caryl Pina, RN, BSN, CCRN Alumnus RN Care Manager  Transitions of Care  [facility] - [facility] [phone]: direct office

## 2023-01-28 ENCOUNTER — Encounter (HOSPITAL_COMMUNITY): Payer: Self-pay

## 2023-01-28 ENCOUNTER — Other Ambulatory Visit: Payer: Self-pay | Admitting: Family

## 2023-01-28 ENCOUNTER — Ambulatory Visit (HOSPITAL_COMMUNITY)
Admit: 2023-01-28 | Discharge: 2023-01-28 | Disposition: A | Discharge status: Home / Self Care | Payer: Medicare Other | Attending: Adult Health | Admitting: Adult Health

## 2023-01-28 VITALS — BP 100/60 | HR 92 | Wt 207.2 lb

## 2023-01-28 DIAGNOSIS — I513 Intracardiac thrombosis, not elsewhere classified: Secondary | ICD-10-CM | POA: Diagnosis not present

## 2023-01-28 DIAGNOSIS — Z8673 Personal history of transient ischemic attack (TIA), and cerebral infarction without residual deficits: Secondary | ICD-10-CM | POA: Diagnosis not present

## 2023-01-28 DIAGNOSIS — E785 Hyperlipidemia, unspecified: Secondary | ICD-10-CM | POA: Diagnosis not present

## 2023-01-28 DIAGNOSIS — G4733 Obstructive sleep apnea (adult) (pediatric): Secondary | ICD-10-CM

## 2023-01-28 DIAGNOSIS — I480 Paroxysmal atrial fibrillation: Secondary | ICD-10-CM | POA: Diagnosis not present

## 2023-01-28 DIAGNOSIS — Z7901 Long term (current) use of anticoagulants: Secondary | ICD-10-CM | POA: Insufficient documentation

## 2023-01-28 DIAGNOSIS — I5022 Chronic systolic (congestive) heart failure: Secondary | ICD-10-CM | POA: Diagnosis not present

## 2023-01-28 DIAGNOSIS — I4891 Unspecified atrial fibrillation: Secondary | ICD-10-CM

## 2023-01-28 DIAGNOSIS — I130 Hypertensive heart and chronic kidney disease with heart failure and stage 1 through stage 4 chronic kidney disease, or unspecified chronic kidney disease: Secondary | ICD-10-CM | POA: Diagnosis not present

## 2023-01-28 DIAGNOSIS — N1832 Chronic kidney disease, stage 3b: Secondary | ICD-10-CM

## 2023-01-28 MED ORDER — FUROSEMIDE 20 MG PO TABS: 80.0000 mg | ORAL_TABLET | Freq: Every day | ORAL | Status: DC

## 2023-01-28 NOTE — Patient Instructions (Signed)
Great to see you today!!!  TODAY take an extra 60 mg (3 tabs) of Furosemide  Starting tomorrow (Sat 11/16) Take Furosemide 80 mg (4 tabs) Daily  Your physician recommends that you schedule a follow-up appointment in: 4 days  Do the following things EVERYDAY: Weigh yourself in the morning before breakfast. Write it down and keep it in a log. Take your medicines as prescribed Eat low salt foods--Limit salt (sodium) to 2000 mg per day.  Stay as active as you can everyday Limit all fluids for the day to less than 2 liters  If you have any questions, issues, or concerns before your next appointment please call our office at [phone], opt. 2 and leave a message for the triage nurse.

## 2023-01-28 NOTE — Progress Notes (Signed)
HEART & VASCULAR TRANSITION OF CARE CONSULT NOTE     Referring Physician:Dr Crenshaw  Primary Care: Brandon [name]  Primary Cardiologist: Dr Antoine Poche  HPI: Referred to clinic by Dr Brandon [name]  for heart failure consultation.   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  He had CVA in February 2024. Echo EF 45-50% and normal RV.   Called his Cardiology on 12/31/22 and reported increased lower extremity edema. He was instructed to increase lasix to 40 mg daily x 3 days then back to 20 mg daily. He was in the office on 01/10/23  and was found to be in new A fib and suspected Acute HF exacerbation. Sent to [facility] for admit.   Admitted 01/10/23 with A/C HFrEF and new onset A fib. Echo showed reduced LVEF 20-25%. Diuresed with IV lasix and transitioned to lasix 20 mg daily. Underwent TEE  and this showed new left atrial thrombus therefore cardioversion was cancelled.  Started on DOAC, SGLT2i, and bb. GDMT further limited by CKD Stage III. Discharge weight 202 pounds. He was referred for outpatient Nephrology follow up. Discharged 01/15/2023.   Overall feeling fine. SOB with steps. Over the last week he has noticed some shortness breath. Denies PND/Orthopnea. Chronically sleeps on 2 pillows.Uses CPAP. Increased lower extremity edema over the last week. Not having much urine output after he takes lasix. No chest pain.  Appetite ok. No fever or chills. Weight at home 202-206 pounds. Taking all medications. Lives with his wife.   Cardiac Testing  01/14/23 TEE + Left Atrial Thrombus, LV 20-25%, RV mildly reduced  Cath 01/13/23  - Dr Brandon [name]:                  5 mmHg (mean) RV:                  40/3-3-5 mmHg PA:                  39/24 mmHg (30 mean) PCWP:            14 mmHg (mean)                                     Estimated Fick CO/CI   5.2 L/min, 2.4 L/min/m2 Thermodilution CO/CI   4.3 L/min, 2 L/min/m2                                                TPG                 16  mmHg                                            PVR                 3-3.7 Wood Units  PAPi                3    Echo 01/11/2023 - LVEF 20-25% RV normal LA severely dilated, [name] moderately dilated, and Mod-severe MVR.   Echo 04/2022 EF  45-50%    Past Medical History:  Diagnosis Date   Anemia    Asthma    Hx of childhood asthma, disappeared for a while, then resurfaced 6-7 years ago.    Cardiomyopathy    with a negative cardiac catheterization in the past. 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     Current Outpatient Medications  Medication Sig Dispense Refill   albuterol (PROVENTIL) (2.5 MG/3ML) 0.083% nebulizer solution TAKE 3 MLS BY NEBULIZER EVERY 6 HOURS AS NEEDED FOR WHEEZING FOR SHORTNESS OF BREATH 150 mL 3   albuterol (VENTOLIN HFA) 108 (90 Base) MCG/ACT inhaler Inhale 1-2 puffs into the lungs every 6 (six) hours as needed for wheezing or shortness of breath. 18 g 5   apixaban (ELIQUIS) 2.5 MG TABS tablet Take 1 tablet (2.5 mg total) by mouth 2 (two) times daily. 60 tablet 2   cyanocobalamin (VITAMIN B12) 1000 MCG tablet Take 1 tablet (1,000 mcg total) by mouth daily.     empagliflozin (JARDIANCE) 10 MG TABS tablet Take 1 tablet (10 mg total) by mouth daily. 30 tablet 2   fluticasone-salmeterol (ADVAIR) 500-50 MCG/ACT AEPB Inhale 1 puff into the lungs in the morning and at bedtime. 60 each 6   folic acid (FOLVITE) 1 MG tablet Take 1 tablet (1 mg total) by mouth daily.     furosemide (LASIX) 20 MG tablet Take 1 tablet (20 mg total) by mouth daily. 30 tablet 3   gabapentin (NEURONTIN) 300 MG capsule Take 1 capsule (300 mg total) by mouth at bedtime. 90 capsule 1   leflunomide (ARAVA) 20 MG tablet Take 20 mg by mouth daily.      metoprolol succinate (TOPROL-XL) 25 MG 24 hr tablet Take 1 tablet (25 mg total) by mouth daily.     montelukast (SINGULAIR) 10 MG tablet Take 1 tablet (10 mg total) by mouth at bedtime. 90 tablet 1   omeprazole (PRILOSEC) 20 MG capsule Take 1 capsule (20 mg total) by mouth daily. 90 capsule 3   rosuvastatin (CRESTOR) 10 MG tablet Take 1 tablet (10 mg total) by mouth daily. 90 tablet 1   tamsulosin (FLOMAX) 0.4 MG CAPS capsule Take 1 capsule by mouth once daily 90 capsule 0   No current facility-administered medications for this encounter.    Allergies  Allergen Reactions   Ace Inhibitors Cough   Isosorb Dinitrate-Hydralazine Other (See Comments)    REACTION: dizziness/hypotensive      Social History   Socioeconomic History   Marital status: Married    Spouse name: [name]   Number of children: 3   Years of education: Not on file   Highest education level: Bachelor's degree (e.g., BA, AB, BS)  Occupational History   Occupation: retired  Tobacco Use   Smoking status: Former   Smokeless tobacco: Never   Tobacco comments:    quit smoling in 1990. started when 18, 1 ppd  Vaping Use   Vaping status: Never Used  Substance and Sexual Activity   Alcohol use: Not Currently    Comment: occ   Drug use: Never   Sexual activity: Not on file  Other Topics Concern   Not on file  Social History Narrative   Grew up in [place], attended [employer].  First wife died of breast ca in February 26, 1998. 3 children. Remarried- 8 years. 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    Pt signed designated party release granting access to [facility] to his wife Brandon [name]. Detailed message may be left on home or cell phone. Roselle Locus August 13, 2009 11:34 am.    Social Determinants of Health   Financial Resource Strain: Low Risk  (01/13/2023)   Overall Financial Resource Strain (CARDIA)    Difficulty of Paying Living Expenses: Not hard at all  Food Insecurity: No Food Insecurity  (01/17/2023)   Hunger Vital Sign    Worried About Running Out of Food in the Last Year: Never true    Ran Out of Food in the Last Year: Never true  Transportation Needs: No Transportation Needs (01/17/2023)   PRAPARE - Administrator, Civil Service (Medical): No    Lack of Transportation (Non-Medical): No  Physical Activity: Insufficiently Active (07/25/2020)   Exercise Vital Sign    Days of Exercise per Week: 5 days    Minutes of Exercise per Session: 10 min  Stress: No Stress Concern Present (05/28/2021)   [employer] of Occupational Health - Occupational Stress Questionnaire    Feeling of Stress : Not at all  Social Connections: Socially Integrated (05/28/2021)   Social Connection and Isolation Panel [NHANES]    Frequency of Communication with Friends and Family: More than three times a week    Frequency of Social Gatherings with Friends and Family: Three times a week    Attends Religious Services: More than 4 times per year    Active Member of Clubs or Organizations: Yes    Attends Banker Meetings: More than 4 times per year    Marital Status: Married  Catering manager Violence: Not At Risk (04/19/2022)   Humiliation, Afraid, Rape, and Kick questionnaire    Fear of Current or Ex-Partner: No    Emotionally Abused: No    Physically Abused: No    Sexually Abused: No      Family History  Problem Relation Age of Onset   Multiple myeloma Father    Lupus Sister        died at 80   Diabetes Mellitus II Sister        died from covid-19   Neuropathy Sister    Asthma Daughter    Colon cancer Neg Hx    Esophageal cancer Neg Hx    Stomach cancer Neg Hx    Pancreatic cancer Neg Hx     Vitals:   01/28/23 1421  BP: 100/60  Pulse: 92  SpO2: 97%  Weight: 94 kg (207 lb 3.2 oz)    PHYSICAL EXAM: General:  No respiratory difficulty. Walked in the clinic.  HEENT: normal Neck: supple. JVP 10-11. Carotids 2+ bilat; no bruits. No lymphadenopathy or  thryomegaly appreciated. Cor: PMI nondisplaced. Irregular rate & rhythm. No rubs, gallops or murmurs. Lungs: clear Abdomen: soft, nontender, nondistended. No hepatosplenomegaly. No bruits or masses. Good bowel sounds. Extremities: no cyanosis, clubbing, rash, R and LLE 2+ edema Neuro: alert & oriented x 3, cranial nerves grossly intact. moves all 4 extremities w/o difficulty. Affect pleasant.  ECG: A fib 95 bpm    ASSESSMENT & PLAN: 1. Chronic HFrEF Echo EF20-25% previously 45-50%. Recently diagnosed with new onset A fib. It is possible this is tachy-mediated. He has not had an ischemic work up due to elevated creatinine.TEE with LA thrombus . NYHA III.  GDMT  Diuretic- Volume status elevated. Instructed to increase lasix to 80 mg daily. If volume status is not better next week will need to switch to torsemide.  BB-Continue current dose of Toprol XL.  Ace/ARB/ARNI- No CKD Stage IIIb.  MRA- No CKD Stage IIIb  SGLT2i- Continue jardiance 10 mg daily.  Could consider hydralazine/imdur but SBP 100 so will not add.  Anticipate repeat ECHO in 3-4 months after HF meds optimized.   2. PAF  -Recently diagnosed with A fib and new LA thrombus - Continue Toprol XL at current dose - Continue eliquis 2.5 mg twice a day. Reduced dose for age and elevated creatinine.  - Rate controlled today. Consider down the road. Will need to get some more fluid off prior to cardioversion.   3. LA Thrombus -Noted on TEE 01/14/23. -On eliquis 5 mg twice a day .  - No bleeding issues.   4. CKD Stage  IIIb -Creatinine baseline ~ 2 - Check BMET  -He has an appointment to establish with Nephrology in December.  - Discussed avoiding NSAID    Referred to HFSW (PCP, Medications, Transportation, ETOH Abuse, Drug Abuse, Insurance, Financial ): No Refer to Pharmacy:  No Refer to Home Health:  No Refer to Advanced Heart Failure Clinic:  no  Refer to General Cardiology: Established with Dr Antoine Poche Follow up next  week to reassess volume status. May need to switch to torsemide at that time.   Kennah Hehr NP-C  4:07 PM

## 2023-01-31 NOTE — Progress Notes (Signed)
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  He had CVA in February 2024. Echo EF 45-50% and normal RV.   Called his Cardiology on 12/31/22 and reported increased lower extremity edema. He was instructed to increase lasix to 40 mg daily x 3 days then back to 20 mg daily. He was in the office on 01/10/23  and was found to be in new A fib and suspected Acute HF exacerbation. Sent to [facility] for admit.   Admitted 01/10/23 with A/C HFrEF and new onset A fib. Echo showed reduced LVEF 20-25%. Diuresed with IV lasix and transitioned to lasix 20 mg daily. Underwent TEE  and this showed new left atrial thrombus therefore cardioversion was cancelled.  Started on DOAC, SGLT2i, and bb. GDMT further limited by CKD Stage III. Discharge weight 202 pounds. He was referred for outpatient Nephrology follow up. Discharged 01/15/2023.   Last week I saw him in the HF [facility] clinic. He was volume overloaded and instructed to increase lasix to 80 mg daily.   Today he returns for HF follow up with his wfie.  He says he didn't really have a lot more urine out on the higher dose of lasix. Overall feeling ok but remains short of breath walking up steps. Denies PND/Orthopnea. Appetite ok. No fever or chills. Weight at home has gone down a few pounds  201-204 pounds. No bleeding issues. Says he has been able to tighten his belt. Taking all medications.   Cardiac Testing  01/14/23 TEE + Left Atrial Thrombus, LV 20-25%, RV mildly reduced  Cath 01/13/23  - Dr Gasper Lloyd RA:                  5 mmHg (mean) RV:                  40/3-3-5 mmHg PA:                  39/24 mmHg (30 mean) PCWP:            14 mmHg (mean)                                     Estimated Fick CO/CI   5.2 L/min, 2.4  L/min/m2 Thermodilution CO/CI   4.3 L/min, 2 L/min/m2                                               TPG                 16  mmHg                                            PVR                 3-3.7 Wood Units  PAPi                3  Echo 01/11/2023 - LVEF 20-25% RV normal LA severely dilated, RA moderately dilated, and Mod-severe MVR.   Echo 04/2022 EF 45-50%    Past Medical History:  Diagnosis Date   Anemia    Asthma    Hx of childhood asthma, disappeared for a while, then resurfaced 6-7 years ago.    Cardiomyopathy    with a negative cardiac catheterization in the past. 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     Current Outpatient Medications  Medication Sig Dispense Refill   albuterol (PROVENTIL) (2.5 MG/3ML) 0.083% nebulizer solution TAKE 3 MLS BY NEBULIZER EVERY 6 HOURS AS NEEDED FOR WHEEZING FOR SHORTNESS OF BREATH 150 mL 3   albuterol (VENTOLIN HFA) 108 (90 Base) MCG/ACT inhaler Inhale 1-2 puffs into the lungs every 6 (six) hours as needed for wheezing or shortness of breath. 18 g 5   apixaban (ELIQUIS) 2.5 MG TABS tablet Take 1 tablet (2.5 mg total) by mouth 2 (two) times daily. 60 tablet 2   cyanocobalamin (VITAMIN B12) 1000 MCG tablet Take 1 tablet (1,000 mcg total) by mouth daily.     empagliflozin (JARDIANCE) 10 MG TABS tablet Take 1 tablet (10 mg total) by mouth daily. 30 tablet 2   fluticasone-salmeterol (ADVAIR) 500-50 MCG/ACT AEPB Inhale 1 puff into the lungs in the morning and at bedtime. 60 each 6   folic acid (FOLVITE) 1 MG tablet Take 1 tablet (1 mg total) by mouth daily.     furosemide (LASIX) 20 MG tablet Take 4 tablets (80 mg total) by mouth daily.     gabapentin (NEURONTIN) 300 MG capsule Take 1 capsule (300 mg total) by mouth at bedtime. 90 capsule 1    leflunomide (ARAVA) 20 MG tablet Take 20 mg by mouth daily.     metoprolol succinate (TOPROL-XL) 25 MG 24 hr tablet Take 1 tablet (25 mg total) by mouth daily.     montelukast (SINGULAIR) 10 MG tablet Take 1 tablet (10 mg total) by mouth at bedtime. 90 tablet 1   omeprazole (PRILOSEC) 20 MG capsule Take 1 capsule (20 mg total) by mouth daily. 90 capsule 3   rosuvastatin (CRESTOR) 10 MG tablet Take 1 tablet (10 mg total) by mouth daily. 90 tablet 1   tamsulosin (FLOMAX) 0.4 MG CAPS capsule Take 1 capsule by mouth once daily 90 capsule 0   No current facility-administered medications for this encounter.    Allergies  Allergen Reactions   Ace Inhibitors Cough   Isosorb Dinitrate-Hydralazine Other (See Comments)    REACTION: dizziness/hypotensive      Social History   Socioeconomic History   Marital status: Married    Spouse name: [name]   Number of children: 3   Years of education: Not on file   Highest education level: Bachelor's degree (e.g., BA, AB, BS)  Occupational History   Occupation: retired  Tobacco Use   Smoking status: Former   Smokeless tobacco: Never   Tobacco comments:    quit smoling in 1990. started when 18, 1 ppd  Vaping Use   Vaping status: Never Used  Substance and Sexual Activity   Alcohol use: Not Currently    Comment: occ   Drug use: Never   Sexual activity: Not on file  Other  Topics Concern   Not on file  Social History Narrative   Grew up in [place], attended [employer]. First wife died of breast ca in 1998/02/06. 3 children. Remarried- 8 years. 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    Pt signed designated party release granting access to [facility] to his wife [name]. Detailed message may be left on home or cell phone. Roselle Locus August 13, 2009 11:34 am.    Social Determinants of Health   Financial Resource Strain: Low Risk  (01/13/2023)   Overall Financial Resource Strain (CARDIA)    Difficulty of Paying Living Expenses:  Not hard at all  Food Insecurity: No Food Insecurity (01/17/2023)   Hunger Vital Sign    Worried About Running Out of Food in the Last Year: Never true    Ran Out of Food in the Last Year: Never true  Transportation Needs: No Transportation Needs (01/17/2023)   PRAPARE - Administrator, Civil Service (Medical): No    Lack of Transportation (Non-Medical): No  Physical Activity: Insufficiently Active (07/25/2020)   Exercise Vital Sign    Days of Exercise per Week: 5 days    Minutes of Exercise per Session: 10 min  Stress: No Stress Concern Present (05/28/2021)   [employer] of Occupational Health - Occupational Stress Questionnaire    Feeling of Stress : Not at all  Social Connections: Socially Integrated (05/28/2021)   Social Connection and Isolation Panel [NHANES]    Frequency of Communication with Friends and Family: More than three times a week    Frequency of Social Gatherings with Friends and Family: Three times a week    Attends Religious Services: More than 4 times per year    Active Member of Clubs or Organizations: Yes    Attends Banker Meetings: More than 4 times per year    Marital Status: Married  Catering manager Violence: Not At Risk (04/19/2022)   Humiliation, Afraid, Rape, and Kick questionnaire    Fear of Current or Ex-Partner: No    Emotionally Abused: No    Physically Abused: No    Sexually Abused: No      Family History  Problem Relation Age of Onset   Multiple myeloma Father    Lupus Sister        died at 78   Diabetes Mellitus II Sister        died from covid-19   Neuropathy Sister    Asthma Daughter    Colon cancer Neg Hx    Esophageal cancer Neg Hx    Stomach cancer Neg Hx    Pancreatic cancer Neg Hx     Vitals:   02/01/23 1259  BP: 104/78  Pulse: 98  SpO2: 95%  Weight: 91.2 kg (201 lb 1.6 oz)   Wt Readings from Last 3 Encounters:  02/01/23 91.2 kg (201 lb 1.6 oz)  01/28/23 94 kg (207 lb 3.2 oz)  01/25/23 93.9 kg  (207 lb)     PHYSICAL EXAM: General:  Walked int he clinic. Marland Kitchen No resp difficulty HEENT: normal Neck: supple. JVP 9-10. Carotids 2+ bilat; no bruits. No lymphadenopathy or thryomegaly appreciated. Cor: PMI nondisplaced. Irregular rate & rhythm. No rubs, gallops or murmurs. Lungs: clear Abdomen: soft, nontender, nondistended. No hepatosplenomegaly. No bruits or masses. Good bowel sounds. Extremities: no cyanosis, clubbing, rash,  RLE 2+ LLE 1+ edema Neuro: alert & orientedx3, cranial nerves grossly intact. moves all 4 extremities w/o difficulty. Affect pleasant  ASSESSMENT & PLAN: 1. Chronic HFrEF Echo EF20-25% previously 45-50%. Recently diagnosed with new onset A fib. It is possible this is tachy-mediated. He has not had an ischemic work up due to elevated creatinine.TEE with LA thrombus . NYHA III. Volume status somewhat better but still overloaded. Stop lasix and start torsemide 40 mg daily and can take an extra torsemide for 3 pound weight gain in 24 hours.  GDMT  Diuretic- As above. BB-Continue current dose of Toprol XL.  Ace/ARB/ARNI- No CKD Stage IIIb.  MRA- No CKD Stage IIIb  SGLT2i- Continue jardiance 10 mg daily.  Could consider hydralazine/imdur but SBP 100 so will not add.  Anticipate repeat ECHO in 3-4 months after HF meds optimized.  Check BMET   2. PAF  -Recently diagnosed with A fib and new LA thrombus - Continue Toprol XL at current dose - Continue eliquis 2.5 mg twice a day. Reduced dose for age and elevated creatinine.  - Rate controlled today.  - Will need  cardioversion set up soon per [facility].   3. LA Thrombus -Noted on TEE 01/14/23. -On eliquis 5 mg twice a day .  - No bleeding issues.   4. CKD Stage  IIIb -Creatinine baseline ~ 2 - Creatinine on 01/21/23. Check BMET today.  -He has an appointment to establish with Nephrology in December.  - Discussed avoiding NSAID   Follow up with [facility] 02/17/23.    Malika Demario NP-C  1:01 PM

## 2023-02-01 ENCOUNTER — Encounter (HOSPITAL_COMMUNITY): Payer: Self-pay

## 2023-02-01 ENCOUNTER — Ambulatory Visit (HOSPITAL_COMMUNITY)
Admission: RE | Admit: 2023-02-01 | Discharge: 2023-02-01 | Disposition: A | Discharge status: Home / Self Care | Payer: Medicare Other | Source: Ambulatory Visit | Attending: Adult Health | Admitting: Adult Health

## 2023-02-01 VITALS — BP 104/78 | HR 98 | Wt 201.1 lb

## 2023-02-01 DIAGNOSIS — I480 Paroxysmal atrial fibrillation: Secondary | ICD-10-CM | POA: Insufficient documentation

## 2023-02-01 DIAGNOSIS — E785 Hyperlipidemia, unspecified: Secondary | ICD-10-CM | POA: Diagnosis present

## 2023-02-01 DIAGNOSIS — Z8673 Personal history of transient ischemic attack (TIA), and cerebral infarction without residual deficits: Secondary | ICD-10-CM | POA: Insufficient documentation

## 2023-02-01 DIAGNOSIS — I4891 Unspecified atrial fibrillation: Secondary | ICD-10-CM

## 2023-02-01 DIAGNOSIS — Z7901 Long term (current) use of anticoagulants: Secondary | ICD-10-CM | POA: Diagnosis not present

## 2023-02-01 DIAGNOSIS — I130 Hypertensive heart and chronic kidney disease with heart failure and stage 1 through stage 4 chronic kidney disease, or unspecified chronic kidney disease: Secondary | ICD-10-CM | POA: Insufficient documentation

## 2023-02-01 DIAGNOSIS — N1832 Chronic kidney disease, stage 3b: Secondary | ICD-10-CM | POA: Insufficient documentation

## 2023-02-01 DIAGNOSIS — I5022 Chronic systolic (congestive) heart failure: Secondary | ICD-10-CM | POA: Insufficient documentation

## 2023-02-01 DIAGNOSIS — G4733 Obstructive sleep apnea (adult) (pediatric): Secondary | ICD-10-CM | POA: Insufficient documentation

## 2023-02-01 DIAGNOSIS — I513 Intracardiac thrombosis, not elsewhere classified: Secondary | ICD-10-CM | POA: Insufficient documentation

## 2023-02-01 DIAGNOSIS — I428 Other cardiomyopathies: Secondary | ICD-10-CM | POA: Insufficient documentation

## 2023-02-01 LAB — BASIC METABOLIC PANEL
Anion gap: 11 (ref 5–15)
BUN: 20 mg/dL (ref 8–23)
CO2: 26 mmol/L (ref 22–32)
Calcium: 9.7 mg/dL (ref 8.9–10.3)
Chloride: 101 mmol/L (ref 98–111)
Creatinine, Ser: 2.07 mg/dL — ABNORMAL HIGH (ref 0.61–1.24)
GFR, Estimated: 31 mL/min — ABNORMAL LOW (ref >60)
Glucose, Bld: 95 mg/dL (ref 70–99)
Potassium: 3.8 mmol/L (ref 3.5–5.1)
Sodium: 138 mmol/L (ref 135–145)

## 2023-02-01 MED ORDER — TORSEMIDE 20 MG PO TABS: 40.0000 mg | ORAL_TABLET | Freq: Every day | ORAL | 3 refills | Status: DC

## 2023-02-01 NOTE — Patient Instructions (Signed)
Medication Changes:  STOP LASIX (FUROSEMIDE)  START: TORSEMIDE 40MG  DAILY- YOU MAY TAKE AN EXTRA 20MG  DAILY AS NEEDED FOR A WEIGHT GAIN OF 3 POUNDS IN 24 HOURS   Lab Work:  Labs done today, your results will be available in MyChart, we will contact you for abnormal readings  Follow-Up in: AS NEEDED   At the [facility], you and your health needs are our priority. We have a designated team specialized in the treatment of Heart Failure. This Care Team includes your primary Heart Failure Specialized Cardiologist (physician), Advanced Practice Providers (APPs- Physician Assistants and Nurse Practitioners), and Pharmacist who all work together to provide you with the care you need, when you need it.   You may see any of the following providers on your designated Care Team at your next follow up:  Dr. Arvilla Meres Dr. Marca Ancona Dr. Dorthula Nettles Dr. Theresia Bough Tonye Becket, NP Robbie Lis, [facility] [place], [place] Brynda Peon, NP [place] Lee, NP Karle Plumber, PharmD   Please be sure to bring in all your medications bottles to every appointment.   Need to Contact us:  If you have any questions or concerns before your next appointment please send [place] a message through [place] or call our office at [phone].    TO LEAVE A MESSAGE FOR THE NURSE SELECT OPTION 2, PLEASE LEAVE A MESSAGE INCLUDING: YOUR NAME DATE OF BIRTH CALL BACK NUMBER REASON FOR CALL**this is important as we prioritize the call backs  YOU WILL RECEIVE A CALL BACK THE SAME DAY AS LONG AS YOU CALL BEFORE 4:00 PM

## 2023-02-03 ENCOUNTER — Other Ambulatory Visit: Payer: Self-pay | Admitting: *Deleted

## 2023-02-03 NOTE — Patient Instructions (Signed)
Visit Information  Thank you for taking time to visit with me today. Please don't hesitate to contact me if I can be of assistance to you before our next scheduled telephone appointment.  Our next appointment is by telephone on Friday 02/11/23 at 1:15 pm Please call the care guide team at [phone] if you need to cancel or reschedule your appointment.   Following are the goals we discussed today:   Patient Goals/Self-Care Activities: Participate in Transition of Care Program/Attend [facility] scheduled calls Take all medications as prescribed Attend all scheduled provider appointments Call provider office for new concerns or questions  call office if I gain more than 2 pounds in one day or 5 pounds in one week track weight in diary use salt in moderation weigh myself daily know when to call the doctor: for any new or worsening shortness of breath or other concerns with your health; for overnight weight gain more than 3 lbs, or 5 lbs in one week  As the heart doctor advised on 01/28/23: if you experience weight gain at home greater than 3 lbs overnight: please take an extra half- dose of your new fluid pill (Torsemide)  If you are experiencing a Mental Health or Behavioral Health Crisis or need someone to talk to, please  call the Suicide and Crisis Lifeline: 988 call the [place] National Suicide Prevention Lifeline: 619-511-1717 or TTY: 585-806-5486 TTY 380 187 5269) to talk to a trained counselor call 1-800-273-TALK (toll free, 24 hour hotline) go to [facility] [address], [place] [phone]) call the [facility] Crisis Line: 361-595-6729 call 911   Patient verbalizes understanding of instructions and care plan provided today and agrees to view in MyChart. Active MyChart status and patient understanding of how to access instructions and care plan via MyChart confirmed with patient.     Caryl Pina, RN, BSN, Music therapist  Transitions of Care  [facility] - [facility] [phone]: direct office

## 2023-02-03 NOTE — Patient Outreach (Signed)
Care Management  Transitions of Care Program Transitions of Care Post-discharge week 3/ day # 17   02/03/2023 Name: Brandon [name] MRN: [id number] DOB: August 14, 1940  Subjective: Brandon [name] is a 82 y.o. year old male who is a primary care patient of [name], NP. The Care Management team Engaged with patient Engaged with patient by telephone to assess and address transitions of care needs.   Consent to Services:  Patient was given information about care management services, agreed to services, and gave verbal consent to participate.  Enrolled 01/17/23  Assessment: "I am still doing good.  Weighing myself everyday at home, my weight has not changes at all since I got out of the hospital-- it's staying right at 202-204 lbs; they changed my fluid pill to a new medicine, and I am taking it just like they told me to.  Not having any problems at all"          SDOH Interventions    Flowsheet Row Patient Outreach from 02/03/2023 in [place] POPULATION HEALTH DEPARTMENT Patient Outreach from 01/27/2023 in [place] POPULATION HEALTH DEPARTMENT Telephone from 01/17/2023 in [place] POPULATION HEALTH DEPARTMENT Admission (Discharged) from 01/10/2023 in [facility] 4E CV SURGICAL PROGRESSIVE CARE Clinical Support from 07/01/2022 in [facility] Primary Care at [facility] Telephone from 04/20/2022 in [facility] Community Care Coordination  SDOH Interventions        Food Insecurity Interventions -- -- Intervention Not Indicated -- -- --  Housing Interventions Intervention Not Indicated -- -- Intervention Not Indicated -- --  Transportation Interventions -- -- Intervention Not Indicated  [drives self,  wife assists as indicated/ needed] Intervention Not Indicated -- Intervention Not Indicated  [normally drives self,  wife provides transportation as needed]  Utilities Interventions Intervention Not Indicated -- -- -- -- --  Alcohol Usage Interventions -- -- -- --  Intervention Not Indicated (Score <7) --  Financial Strain Interventions -- -- -- Intervention Not Indicated -- --  Health Literacy Interventions -- Intervention Not Indicated -- -- -- --       Goals Addressed             This Visit's Progress    TOC 30-day Program Care Plan   On track    Current Barriers:  Ongoing management of CHF at home, in setting of new onset of rate controlled A-Fib  RNCM Clinical Goal(s):  Patient will work with the Care Management team over the next 30 days to address Transition of Care Barriers: ongoing self- health management of multiple chronic disease states attend all scheduled medical appointments: CHF clinic, cardiology, PCP as evidenced by review of same in EHR and with patient during RN CM outreach visits not experience hospital admission as evidenced by review of EMR. Hospital Admissions in last 6 months = 1  through collaboration with RN Care manager, provider, and care team.   Interventions: Evaluation of current treatment plan related to  self management and patient's adherence to plan as established by provider  Transitions of Care:  Goal on track:  Yes. 02/03/23 Doctor Visits  - discussed the importance of doctor visits Discussed current clinical condition: reports "doing fine, not having any problems; feeling good;" denies specific clinical concerns today Reviewed provider office visit with patient/ CHF clinic on 01/28/23- verbalizes good understanding of same; confirms medication changes with diuretic: Lasix discontinued; now taking Torsemide 40 mg po every day:  confirms obtained and is taking medication as prescribed; verbalizes very good understanding  of guidelines provider during provider visit to take an "extra half" dose if he experiences weight gain at home > 3 lbs overnight Reviewed upcoming provider office visits: confirmed patient is aware of Cardiology visit 02/17/23 and renal provider visit on 02/21/23- confirmed patient aware of  both/ has plans to attend as scheduled Reinforced rationale for daily weight monitoring at home along with weight gain guidelines/ action plan for weight gain; importance of taking diuretic as prescribed- patient 100% adherent to daily weight monitoring at home Reviewed recent daily weights at home with patient: he reports general weight ranges "right at 202-204 lbs" confirms he continues to be adherent to daily weight monitoring at home Depression screening completed: no concerns identified  Patient Goals/Self-Care Activities: Participate in Transition of Care Program/Attend TOC scheduled calls Take all medications as prescribed Attend all scheduled provider appointments Call provider office for new concerns or questions  call office if I gain more than 2 pounds in one day or 5 pounds in one week track weight in diary use salt in moderation weigh myself daily know when to call the doctor: for any new or worsening shortness of breath or other concerns with your health; for overnight weight gain more than 3 lbs, or 5 lbs in one week  As the heart doctor advised on 01/28/23: if you experience weight gain at home greater than 3 lbs overnight: please take an extra half- dose of your new fluid pill (Torsemide)  Follow Up Plan:   Telephone follow up appointment with care management team member scheduled for:  Friday, 02/11/23 at 1:15 pm The patient has been provided with contact information for the care management team and has been advised to call with any health related questions or concerns.   Caryl Pina, RN, BSN, Media planner  Transitions of Care  [facility] - Population Health  [place] [phone]: direct office         Plan: Telephone follow up appointment with care management team member scheduled for:  Friday, 02/11/23 at 1:15 pm  Caryl Pina, RN, BSN, CCRN Alumnus RN Care Manager  Transitions of Care  [facility] - [facility]  Health [phone]: direct office

## 2023-02-11 ENCOUNTER — Other Ambulatory Visit: Payer: Self-pay | Admitting: *Deleted

## 2023-02-11 NOTE — Patient Outreach (Signed)
Care Management  Transitions of Care Program Transitions of Care Post-discharge week 4/ day # 25   02/11/2023 Name: Brandon [name] MRN: [id number] DOB: 11-Nov-1940  Subjective: Brandon [name] is a 82 y.o. year old male who is a primary care patient of [name], NP. The Care Management team Engaged with patient Engaged with patient by telephone to assess and address transitions of care needs.   Consent to Services:  Patient was given information about care management services, agreed to services, and gave verbal consent to participate.  Enrolled 01/17/23  Assessment: "doing fine, not having any problems; I am feeling good; I am about to run out of the medicine they told me to take when they sent me home: Jardiance and Eliquis-- but I am not sure I can afford to keep taking them, they cost a lot;"  otherwise denies specific clinical concerns today Informed patient that I would send message to cardiology provider today to inform of his stated concerns around running out of these medications/ possible inability to afford them moving forward Confirmed has continued taking Torsemide 40 mg po every day as prescribed- reports has not needed to take "extra" dose of Torsemide due to weight gain Reviewed recent daily weights at home with patient: today, he reports general weight ranges "right at 190-192 lbs" which is a significant weight loss from last reported weights on 02/03/23 of 202-203 lbs at home; he confirms he continues to be adherent to daily weight monitoring at home          SDOH Interventions    Flowsheet Row Patient Outreach from 02/03/2023 in [place] POPULATION HEALTH DEPARTMENT Patient Outreach from 01/27/2023 in [place] POPULATION HEALTH DEPARTMENT Telephone from 01/17/2023 in [place] POPULATION HEALTH DEPARTMENT Admission (Discharged) from 01/10/2023 in [facility] 4E CV SURGICAL PROGRESSIVE CARE Clinical Support from 07/01/2022 in [facility] Primary Care at  [facility] Telephone from 04/20/2022 in [facility] Community Care Coordination  SDOH Interventions        Food Insecurity Interventions -- -- Intervention Not Indicated -- -- --  Housing Interventions Intervention Not Indicated -- -- Intervention Not Indicated -- --  Transportation Interventions -- -- Intervention Not Indicated  [drives self,  wife assists as indicated/ needed] Intervention Not Indicated -- Intervention Not Indicated  [normally drives self,  wife provides transportation as needed]  Utilities Interventions Intervention Not Indicated -- -- -- -- --  Alcohol Usage Interventions -- -- -- -- Intervention Not Indicated (Score <7) --  Financial Strain Interventions -- -- -- Intervention Not Indicated -- --  Health Literacy Interventions -- Intervention Not Indicated -- -- -- --        Goals Addressed             This Visit's Progress    TOC 30-day Program Care Plan   On track    Current Barriers:  Ongoing management of CHF at home, in setting of new onset of rate controlled A-Fib 02/11/23: Reports during [facility] 30-day program call that he is almost out of Eliquis and Jardiance as ordered at time of hospital discharge on 01/15/23:  reports these drugs may be too costly for him to continue taking- verified he has cardiology provider appointment on 02/17/23: he will discuss with provider then- I will also send message to provider today as FYI Discussed with patient today options around PAP for medication cost assistance: he would like to discuss with cardiology first, "to see if they still want me to  take them, since they are new medicines after my hospital visit" Confirmed he currently has a limited supply of Eliquis samples-- but they are 5 mg dose (he is prescribed 2.5 mg BID); he verbalizes plans to half- the dose of the samples IF he has to continue taking  RNCM Clinical Goal(s):  Patient will work with the Care Management team over the next 30 days to address  Transition of Care Barriers: ongoing self- health management of multiple chronic disease states attend all scheduled medical appointments: CHF clinic, cardiology, PCP as evidenced by review of same in EHR and with patient during RN CM outreach visits not experience hospital admission as evidenced by review of EMR. Hospital Admissions in last 6 months = 1  through collaboration with RN Care manager, provider, and care team.   Interventions: Evaluation of current treatment plan related to  self management and patient's adherence to plan as established by provider  Transitions of Care:  Goal on track:  Yes. 02/11/23 Doctor Visits  - discussed the importance of doctor visits Discussed current clinical condition: reports "doing fine, not having any problems; I am feeling good; I am about to run out of the medicine they told me to take when they sent me home: Jardiance and Eliquis-- but I am not sure I can afford to keep taking them, they cost a lot;"  otherwise denies specific clinical concerns today Informed patient that I would send message to cardiology provider today to inform of his stated concerns around running out of these medications/ possible inability to afford them moving forward Confirmed has continued taking Torsemide 40 mg po every day as prescribed 01/28/23:  confirms obtained and is taking medication as prescribed; verbalizes very good understanding of guidelines provider during provider visit to take an "extra half" dose if he experiences weight gain at home > 3 lbs overnight: today 02/11/23: confirms he has not required use of "extra" dose of Torsemide: states he has continued weighing himself daily and has not gained any weight Reviewed upcoming provider office visits: confirmed patient is aware of Cardiology visit 02/17/23 and renal provider visit on 02/21/23- confirmed patient aware of both/ has plans to attend as scheduled Reinforced rationale for daily weight monitoring at home along  with weight gain guidelines/ action plan for weight gain; importance of taking diuretic as prescribed- patient 100% adherent to daily weight monitoring at home Reviewed recent daily weights at home with patient: today, he reports general weight ranges "right at 190-192 lbs" which is a significant weight loss from last reported weights on 02/03/23 of 202-203 lbs at home; he confirms he continues to be adherent to daily weight monitoring at home Reinforced rationale for daily weight monitoring at home along with weight gain guidelines/ action plan for weight gain; importance of taking diuretic as prescribed-- he reports very good understanding/ adherence to CHF self-management plan of care at home Reinforced signs and symptoms other than weight gain that indicate fluid retention: he denies all today  Patient Goals/Self-Care Activities: Participate in Transition of Care Program/Attend TOC scheduled calls Take all medications as prescribed Attend all scheduled provider appointments Call provider office for new concerns or questions  call office if I gain more than 2 pounds in one day or 5 pounds in one week track weight in diary use salt in moderation weigh myself daily know when to call the doctor: for any new or worsening shortness of breath or other concerns with your health; for overnight weight gain more than 3 lbs,  or 5 lbs in one week  As the heart doctor advised on 01/28/23: if you experience weight gain at home greater than 3 lbs overnight: please take an extra half- dose of your new fluid pill (Torsemide) I have made your heart doctor aware of your concerns around needing refills/ being able to  afford the Eliquis and the Jardiance that were ordered at the time of your hospital discharge: please discuss this with your doctor when you go to the office visit on 02/17/23  Follow Up Plan:   Telephone follow up appointment with care management team member scheduled for:  Thursday 02/17/23 at 3:00  pm The patient has been provided with contact information for the care management team and has been advised to call with any health related questions or concerns.   Caryl Pina, RN, BSN, Media planner  Transitions of Care  [facility] - Population Health  [place] [phone]: direct office         Plan: Telephone follow up appointment with care management team member scheduled for: Thursday, 02/17/23 at 3:00 pm  Caryl Pina, RN, BSN, CCRN Alumnus RN Care Manager  Transitions of Care  [facility] - [facility] [phone]: direct office

## 2023-02-11 NOTE — Progress Notes (Unsigned)
Cardiology Office Note:    Date:  02/17/2023   ID:  Brandon [name], DOB Aug 14, 1940, MRN [id number]  PCP:  [name], NP  Cardiologist:  [name], MD     Referring MD: [name], NP   Chief Complaint: hospital follow-up of CHF and atrial fibrillation  History of Present Illness:    Brandon [name] is a 82 y.o. 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 Antoine Poche and presents today for hospital follow-up of CHF and atrial fibrillation.  Patient has a long history of non-ischemic cardiomyopathy/ HFrEF. Cardiac catheterization was reportedly negative for obstructive CAD in the past. Last Echo in 04/2022 showed LVEF of 45-50% with global hypokinesis.   He was last seen by Brandon [name] on 01/10/2023. at which time he reported worsening lower extremity edema over the last 2 weeks as well as mild dyspnea on exertion. He did increase his Lasix for 3 days but did not have any appreciable diuresis with this. He denied any chest pain or palpitations. He was noted to be in new onset rate controlled atrial fibrillation and acute CHF. He was started on anticoagulation with Eliquis and was directly admitted from the office for IV diuresis. Echo showed LVEF of 20-25% with global hypokinesis, moderately enlarged RV with normal RV function and moderately elevated PASP of 48.4 mmHg, and moderate to severe MR. He was diuresed with IV Lasix but this was stopped on 10/29 due to worsening renal function. 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 Initial plan was for a TEE/ DCCV on 01/14/2023. However, TEE showed a severely dilated left atrium with a left atrial appendage thrombus so DCCV was not performed. He was discharged on Lasix 20mg  daily. GDMT was limited by soft BP - home Entresto and Spironolactone were stopped and Toprol-XL was decreased but he was started on Jardiance 10mg  daily. Of note, LHC was not performed due to renal function. He was referred to Nephrology as an outpatient.  He was seen by Brandon Becket, NP in the CHF clinic, on 01/28/2023 for follow-up at which time he did report some shortness of breath and was noted ot be volume overloaded. Lasix was increased to 80mg  daily.  He was seen by Brandon Becket, NP, again on 02/01/2023 at which time he reported significant increased in urine output on the higher dose of Lasix and still reported some shortness of breath when walking up steps. His volume status had improved some but he still looked volume overloaded. Therefore, Lasix was stopped and he was started on Torsemide instead.   Patient presents today for follow-up. Here with his wife. He is overall doing well since transitioning from Lasix to Torsemide. He reports better urinary output on this. He also reports some improvement in his shortness of breath but still has some dyspnea when walking for prolonged periods of time or walking up/ down the stairs. No shortness of breath at rest. He has chronic stable orthopnea since admission in 12/2022. No PND. He continues to have chronic lower extremity edema. He states the left leg  usually is larger than the right but states today the right leg looks bigger. He denies any weight gain and states he has has been slowly losing weight. He denies any pain in his legs. He denies any chest pain. He is asymptomatic with his atrial fibrillation and denies any palpitations, lightheadedness, dizziness, or syncope.   EKGs/Labs/Other Studies Reviewed:    The  following studies were reviewed:  TTE 01/11/2023: Impressions:  1. Left ventricular ejection fraction, by estimation, is 20 to 25%. The  left ventricle has severely decreased function. The left ventricle  demonstrates global hypokinesis. The left ventricular internal cavity size  was severely dilated. Left ventricular  diastolic function could not be evaluated.   2. Right ventricular systolic function is normal. The right ventricular  size is moderately enlarged. There is moderately elevated pulmonary artery  systolic pressure. The estimated right ventricular systolic pressure is  48.4 mmHg.   3. Left atrial size was severely dilated.   4. Right atrial size was moderately dilated.   5. The mitral valve is normal in structure. Moderate to severe mitral  valve regurgitation. No evidence of mitral stenosis.   6. The aortic valve is tricuspid. Aortic valve regurgitation is trivial.  Aortic valve sclerosis/calcification is present, without any evidence of  aortic stenosis. Aortic valve area, by VTI measures 1.80 cm. Aortic valve  mean gradient measures 2.2 mmHg.   Aortic valve Vmax measures 1.06 m/s.   7. The inferior vena cava is normal in size with <50% respiratory  variability, suggesting right atrial pressure of 8 mmHg.  _____________   Right Cardiac Catheterization 01/13/2023: Hemodynamics: RA:                  5 mmHg (mean) RV:                  40/3-3-5 mmHg PA:                  39/24 mmHg (30 mean) PCWP:            14 mmHg (mean)                                      Estimated Fick CO/CI   5.2 L/min, 2.4 L/min/m2 Thermodilution CO/CI   4.3 L/min, 2 L/min/m2                                                 TPG                 16  mmHg                                            PVR                 3-3.7 Wood Units  PAPi                3       Impression: Normal pre and post capillary filling pressures.  Mildly elevated PA mean & PVR consistent with CPCPH with likely a  predominant Group II component.  Mild to moderately reduced cardiac output / index; reduced  by thermodilution.  _______________   TEE/ DCCV 01/14/2023: Impressions: 1. Left ventricular ejection fraction, by estimation, is 20 to 25%. The  left ventricle has severely decreased function. The left ventricle  demonstrates global hypokinesis. The left ventricular internal cavity size  was severely dilated. Left ventricular  diastolic function could not be evaluated.   2. Right ventricular systolic function is mildly reduced. The right  ventricular size is mildly enlarged. There is normal pulmonary artery  systolic pressure. The estimated right ventricular systolic pressure is  24.6 mmHg.   3. There is a 9 mm x 4 mm mobile thrombus in the mid left atrial  appendage. Left atrial size was severely dilated. A left atrial/left  atrial appendage thrombus was detected.   4. Right atrial size was moderately dilated.   5. The mitral valve is normal in structure. Mild to moderate mitral valve  regurgitation.   6. The aortic valve is tricuspid. Aortic valve regurgitation is trivial.  7. No aortic stenosis is present.    DCCV not performed due to LAA thrombus.  EKG:  EKG ordered today.   EKG Interpretation Date/Time:  Thursday February 17 2023 10:39:33 EST Ventricular Rate:  91 PR Interval:    QRS Duration:  148 QT Interval:  410 QTC Calculation: 504 R Axis:   -86  Text Interpretation: Atrial fibrillation with premature ventricular or aberrantly conducted complexes Right bundle branch block Left anterior fascicular block Bifascicular block Septal infarct , age undetermined When compared with ECG of 28-Jan-2023 14:29, No significant change since last tracing Confirmed by Marjie Skiff [phone]) on 02/17/2023 11:28:32 AM     Recent Labs: 03/22/2022: Pro B Natriuretic peptide (BNP) 1,464.0 01/10/2023: ALT 8 01/11/2023: TSH 2.296 01/14/2023: Magnesium 1.9 01/15/2023: Hemoglobin 10.1; Platelets  136 02/01/2023: BUN 20; Creatinine, Ser 2.07; Potassium 3.8; Sodium 138  Recent Lipid Panel    Component Value Date/Time   CHOL 129 09/21/2022 1534   TRIG 78.0 09/21/2022 1534   HDL 53.30 09/21/2022 1534   CHOLHDL 2 09/21/2022 1534   VLDL 15.6 09/21/2022 1534   LDLCALC 60 09/21/2022 1534    Physical Exam:    Vital Signs: BP 110/62   Pulse 91   Ht 5\' 11"  (1.803 m)   Wt 198 lb (89.8 kg)   SpO2 97%   BMI 27.62 kg/m     Wt Readings from Last 3 Encounters:  02/17/23 198 lb (89.8 kg)  02/01/23 201 lb 1.6 oz (91.2 kg)  01/28/23 207 lb 3.2 oz (94 kg)     General: 82 y.o. African-American male in no acute distress. HEENT: Normocephalic and atraumatic. Sclera clear.  Neck: Supple. No carotid bruits. No JVD. Heart: Irregularly irregular rhythm with normal rate. Distinct S1 and S2. No murmurs, gallops, or rubs.  Lungs: No increased work of breathing. Clear to ausculation bilaterally. No wheezes, rhonchi, or rales.  Extremities: Tight 1-2+ lower extremity edema bilaterally (right > left). Skin: Warm and dry. Neuro: No focal deficits. Psych: Normal affect. Responds appropriately.   Assessment:    1. Chronic systolic heart failure (HCC)   2. Persistent atrial fibrillation (HCC)   3. Thrombus of left atrial appendage   4. Mitral valve insufficiency, unspecified etiology   5. Primary hypertension   6. Stage 3b chronic kidney disease (HCC)   7. Obstructive sleep apnea     Plan:    Chronic HFrEF Patient has a long history of non-ischemic cardiomyopathy/ HFrEF. Cardiac catheterization was reportedly negative for obstructive CAD in the past. He was recently  admitted at the end of 12/2022 for acute CHF and new onset atrial fibrillation. Echo at that time showed LVEF of 20-25% (down from 45-50% in 04/2022) with global hypokinesis, moderately enlarged RV with normal RV function and moderately elevated PASP of 48.4 mmHg, and moderate to severe MR. 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 - He continues to have lower extremity edema but overall feeling better and weights slowly coming down.  - Continue Torsemide 40mg  daily. Can continue to take an extra 20mg  as needed for weight gain. - Continue Toprol-XL 25mg  daily. - Continue Jardiance 10mg  daily. He is having trouble affording this right now. Will provide samples today and Patient Assistance forms.  - No ACEi/ ARB/ ARNI or MRA due to renal function. - Will repeat BMET today.  - Discussed importance of daily weights and sodium/ fluid restrictions.  - Etiology of significant drop of EF unclear. Possibly atrial fibrillation but he was rate controlled on presentation. Cannot rule out ischemia. LHC was not performed this admission due to renal function. Will plan to repeat Echo in about 3 months after optimization of GDMT. If no improvement in EF, may need to consider repeat ischemic evaluation.   Persistently Atrial Fibrillation Left Atrial Appendage Thrombus Diagnosed in 12/2022.  TEE on 01/14/2023 showed left atrial appendage thrombus; therefore, DCCV was not performed.  - Rate controlled.  - Continue Toprol-XL 25mg  daily.  - Continue chronic anticoagulation with Eliquis 2.5mg  twice daily (reduced dose due to age and renal function). Will provide patient Assistance forms.  - Will plan to repeat TEE/ DCCV later this month. He denies missing any doses of his Eliquis in the last month. Will check pre-procedural labs (CBC and BMET) today.   Shared Decision Making/Informed Consent{ The risks [stroke, cardiac arrhythmias rarely resulting in the need for a temporary or permanent pacemaker, skin irritation or burns, esophageal damage, perforation (1:10,000 risk), bleeding, pharyngeal hematoma as well as other potential complications  associated with conscious sedation including aspiration, arrhythmia, respiratory failure and death], benefits (treatment guidance, restoration of normal sinus rhythm, diagnostic support) and alternatives of a transesophageal echocardiogram guided cardioversion were discussed in detail with Brandon [name] and he is willing to proceed.    Mitral Regurgitation TTE on 01/11/2023 showed moderate to severe MR, but TEE on 01/14/2023 showed mild to moderate MR.  Hypertension BP well controlled. - Continue medications for CHF as above.  CKD Stage IIIb Baseline creatinine prior to recent admission in 12/2022 was 1.3 to 1.4. However, creatinien has been around 1.8 to 2.0 since discharge. - Will repeat BMET today.   Obstructive Sleep Apnea - Continue CPAP.   Disposition: Follow up in about 1 month after TEE/ DCCV   Signed, Corrin Parker, PA-C  02/17/2023 11:49 AM     [facility]

## 2023-02-11 NOTE — Patient Instructions (Signed)
Visit Information  Thank you for taking time to visit with me today. Please don't hesitate to contact me if I can be of assistance to you before our next scheduled telephone appointment.  Our next appointment is by telephone on Thursday, 02/17/23 at 3:00 pm  Please call the care guide team at [phone] if you need to cancel or reschedule your appointment.   Following are the goals we discussed today:  Patient Goals/Self-Care Activities: Participate in Transition of Care Program/Attend [facility] scheduled calls Take all medications as prescribed Attend all scheduled provider appointments Call provider office for new concerns or questions  call office if I gain more than 2 pounds in one day or 5 pounds in one week track weight in diary use salt in moderation weigh myself daily know when to call the doctor: for any new or worsening shortness of breath or other concerns with your health; for overnight weight gain more than 3 lbs, or 5 lbs in one week  As the heart doctor advised on 01/28/23: if you experience weight gain at home greater than 3 lbs overnight: please take an extra half- dose of your new fluid pill (Torsemide) I have made your heart doctor aware of your concerns around needing refills/ being able to  afford the Eliquis and the Jardiance that were ordered at the time of your hospital discharge: please discuss this with your doctor when you go to the office visit on 02/17/23  If you are experiencing a Mental Health or Behavioral Health Crisis or need someone to talk to, please  call the Suicide and Crisis Lifeline: 988 call the [place] National Suicide Prevention Lifeline: (847)847-2410 or TTY: (979) 408-3375 TTY 937 840 8783) to talk to a trained counselor call 1-800-273-TALK (toll free, 24 hour hotline) go to [facility] [address], [place] [phone]) call the [facility] Crisis Line: [phone] call 911   Patient verbalizes  understanding of instructions and care plan provided today and agrees to view in MyChart. Active MyChart status and patient understanding of how to access instructions and care plan via MyChart confirmed with patient.     Caryl Pina, RN, BSN, Media planner  Transitions of Care  [facility] - [facility] [phone]: direct office

## 2023-02-17 ENCOUNTER — Encounter: Payer: Self-pay | Admitting: Student

## 2023-02-17 ENCOUNTER — Telehealth: Payer: Self-pay | Admitting: Pharmacist Clinician (PhC)/ Clinical Pharmacy Specialist

## 2023-02-17 ENCOUNTER — Other Ambulatory Visit: Payer: Self-pay | Admitting: *Deleted

## 2023-02-17 ENCOUNTER — Ambulatory Visit: Payer: Medicare Other | Attending: Student | Admitting: Student

## 2023-02-17 VITALS — BP 110/62 | HR 91 | Ht 71.0 in | Wt 198.0 lb

## 2023-02-17 DIAGNOSIS — I4819 Other persistent atrial fibrillation: Secondary | ICD-10-CM | POA: Diagnosis not present

## 2023-02-17 DIAGNOSIS — N1832 Chronic kidney disease, stage 3b: Secondary | ICD-10-CM | POA: Diagnosis not present

## 2023-02-17 DIAGNOSIS — I5022 Chronic systolic (congestive) heart failure: Secondary | ICD-10-CM | POA: Diagnosis not present

## 2023-02-17 DIAGNOSIS — G4733 Obstructive sleep apnea (adult) (pediatric): Secondary | ICD-10-CM | POA: Diagnosis not present

## 2023-02-17 DIAGNOSIS — I340 Nonrheumatic mitral (valve) insufficiency: Secondary | ICD-10-CM

## 2023-02-17 DIAGNOSIS — I513 Intracardiac thrombosis, not elsewhere classified: Secondary | ICD-10-CM

## 2023-02-17 DIAGNOSIS — I10 Essential (primary) hypertension: Secondary | ICD-10-CM

## 2023-02-17 MED ORDER — EMPAGLIFLOZIN 10 MG PO TABS: 10.0000 mg | ORAL_TABLET | Freq: Every day | ORAL | 0 refills | Status: DC

## 2023-02-17 MED ORDER — EMPAGLIFLOZIN 10 MG PO TABS: 10.0000 mg | ORAL_TABLET | Freq: Every day | ORAL | 3 refills | Status: DC

## 2023-02-17 NOTE — Patient Outreach (Signed)
Care Management  Transitions of Care Program Transitions of Care Post-discharge week # 5- day # 31:  TOC 30-day program case closure   02/17/2023 Name: Brandon [name] MRN: [id number] DOB: Mar 24, 1940  Subjective: Brandon [name] is a 82 y.o. year old male who is a primary care patient of Brandon Craze, NP. The Care Management team Engaged with patient Engaged with patient by telephone to assess and address transitions of care needs.   Consent to Services:  Patient was given information about care management services, agreed to services, and gave verbal consent to participate.  Enrolled 01/17/23  Assessment: "still doing fine, not having any problems; I had a real good visit with the heart doctor today and she gave me the forms I need for the patient assistance with the Eliquis and the Jardiance.  I haven't started filling them out yet, but I will.  She said that their office completed the one for the Jardiance, but I need to complete the one for the Eliquis.  They told me I will probably need to call my pharmacy to fill it out, so I will get it done eventually.  I have 30-days left of the medicine; they gave me samples of both today.  They scheduled me for an outpatient procedure on Monday 02/28/23, so please do have the new nurse call me after that is done.  My weights are stable; they are running around 198 lbs, and Brandon [name] said that was good today."  02/17/23: Patient has successfully met his previously established goals; TOC 30-day program case closure, with transfer to longitudinal RN CM-- shceduled for outreach with Brandon Sheriff RN CM on 03/02/23 after his scheduled outpatient TEE/ DCCV on 02/27/13-- copied chart to Brandon [name] as FYI          SDOH Interventions    Flowsheet Row Patient Outreach from 02/03/2023 in [place] POPULATION HEALTH DEPARTMENT Patient Outreach from 01/27/2023 in [place] POPULATION HEALTH DEPARTMENT Telephone from 01/17/2023 in [place] POPULATION HEALTH  DEPARTMENT Admission (Discharged) from 01/10/2023 in [facility] 4E CV SURGICAL PROGRESSIVE CARE Clinical Support from 07/01/2022 in [facility] Primary Care at [facility] Telephone from 04/20/2022 in [facility] Community Care Coordination  SDOH Interventions        Food Insecurity Interventions -- -- Intervention Not Indicated -- -- --  Housing Interventions Intervention Not Indicated -- -- Intervention Not Indicated -- --  Transportation Interventions -- -- Intervention Not Indicated  [drives self,  wife assists as indicated/ needed] Intervention Not Indicated -- Intervention Not Indicated  [normally drives self,  wife provides transportation as needed]  Utilities Interventions Intervention Not Indicated -- -- -- -- --  Alcohol Usage Interventions -- -- -- -- Intervention Not Indicated (Score <7) --  Financial Strain Interventions -- -- -- Intervention Not Indicated -- --  Health Literacy Interventions -- Intervention Not Indicated -- -- -- --        Goals Addressed             This Visit's Progress    TOC 30-day Program Care Plan   On track    Current Barriers:  Ongoing management of CHF at home, in setting of new onset of rate controlled A-Fib 02/11/23: Reports during Brandon [name] 30-day program call that he is almost out of Eliquis and Jardiance as ordered at time of [name] discharge on 01/15/23:  reports these drugs may be too costly for him to continue taking- verified he has cardiology provider appointment on 02/17/23: he  will discuss with provider then- I will also send message to provider today as FYI Discussed with patient today options around PAP for medication cost assistance: he would like to discuss with cardiology first, "to see if they still want me to take them, since they are new medicines after my [name] visit" Confirmed he currently has a limited supply of Eliquis samples-- but they are 5 mg dose (he is prescribed 2.5 mg BID); he verbalizes plans to half-  the dose of the samples IF he has to continue taking 02/17/23: Confirmed he attended cardiology office visit earlier today and obtained samples for both Eliquis and Jardiance, as well as PAP forms for both medications as well; patient stated that cardiology staff "filled out the Jardiance form;" but adds that he needs to complete the Eliquis form- wants to call his outpatient pharmacy to check prices prior to starting the form:  made longitudinal RN CM aware of this, in case patient needs referral for Pharmacy assistance when she contacts patient on 03/02/23: updated patient that this was an option  RNCM Clinical Goal(s): 02/17/23: Patient has successfully met his previously established goals; TOC 30-day program case closure, with transfer to longitudinal RN CM-- shceduled for outreach with Brandon Sheriff RN CM on 03/02/23 after his scheduled outpatient TEE/ DCCV on 02/27/13 Patient will work with the Care Management team over the next 30 days to address Transition of Care Barriers: ongoing self- health management of multiple chronic disease states attend all scheduled medical appointments: CHF clinic, cardiology, PCP as evidenced by review of same in EHR and with patient during RN CM outreach visits not experience [name] admission as evidenced by review of EMR. [name] Admissions in last 6 months = 1  through collaboration with RN Care manager, provider, and care team.   Interventions: Evaluation of current treatment plan related to  self management and patient's adherence to plan as established by provider  Transitions of Care:  Goal Met. 02/17/23 Doctor Visits  - discussed the importance of doctor visits Discussed current clinical condition: reports "still doing fine, not having any problems; I had a real good visit with the heart doctor today and she gave me the forms I need for the patient assistance with the Eliquis and the Jardiance.  I haven't started filling them out yet, but I will.  She  said that their office completed the one for the Jardiance, but I need to complete the one for the Eliquis.  They told me I will probably need to call my pharmacy to fill it out, so I will get it done eventually.  I have 30-days left of the medicine; they gave me samples of both today.  They scheduled me for an outpatient procedure on Monday 02/28/23, so please do have the new nurse call me after that is done.  My weights are stable; they are running around 198 lbs, and Brandon [name] said that was good today." Reviewed recent [name] cardiology office visit with cardiology today - verbalizes very good understanding of same Verbalizes excellent understanding of pre-procedure TEE/ DCCV instructions to stop Jardiance on 02/24/23; remain NPO after MN on 02/28/23; continue Eliquis on day of procedure; hold Torsemide on day of procedure: he has already started reading post-office visit AVS Confirms his wife will provide transportation to scheduled procedure Confirmed no questions around procedure- he reports "Brandon [name] explained everything to me real good" Confirmed has continued taking Torsemide 40 mg po every day as prescribed 01/28/23:  He re-confirms and verbalizes very good understanding  of guidelines provider during provider visit to take an "extra half" dose if he experiences weight gain at home > 3 lbs overnight/ 5 lbs in one week: today 02/17/23: confirms he has not required use of "extra" dose of Torsemide: states he has continued weighing himself daily and has not gained any weight; reports general weights at home between "197-200 lbs" Confirmed plans to attend renal provider office visit on 02/21/23- reports he also has plans to return to cardiology office for lab draw "next week" Reinforced rationale for daily weight monitoring at home along with weight gain guidelines/ action plan for weight gain; importance of taking diuretic as prescribed- patient 100% adherent to daily weight monitoring at home; he reports very  good understanding/ adherence to CHF self-management plan of care at home Reinforced signs and symptoms other than weight gain that indicate fluid retention: he denies all today  Patient Goals/Self-Care Activities: Participate in Transition of Care Program/Attend TOC scheduled calls Take all medications as prescribed Attend all scheduled provider appointments Call provider office for new concerns or questions  call office if I gain more than 2 pounds in one day or 5 pounds in one week track weight in diary use salt in moderation weigh myself daily know when to call the doctor: for any new or worsening shortness of breath or other concerns with your health; for overnight weight gain more than 3 lbs, or 5 lbs in one week  As the heart doctor advised: if you experience weight gain at home greater than 3 lbs overnight: please take an extra half- dose of your new fluid pill (Torsemide) If you need additional assistance in completing the Patient Assistance forms for Eliquis and Jardiance, please let the nurse care manager know- she is going to call you on Wednesday 03/02/23 after you have your scheduled procedure  Follow Up Plan:   Telephone follow up appointment with care management team member scheduled for:  Wednesday 03/02/23 at 9:30 am The patient has been provided with contact information for the care management team and has been advised to call with any health related questions or concerns.   Caryl Pina, RN, BSN, Media planner  Transitions of Care  [facility] - Population Health  [place] [phone]: direct office         Plan: Telephone follow up appointment with care management team member scheduled for:  Wednesday 03/02/23 at 9:30 am with longitudinal RN CM Brandon Sheriff, RN CM  Caryl Pina, RN, BSN, CCRN Alumnus RN Care Manager  Transitions of Care  [facility] - [name] [phone]: direct office

## 2023-02-17 NOTE — Addendum Note (Signed)
Addended by: Morrell Riddle on: 02/17/2023 04:53 PM   Modules accepted: Orders

## 2023-02-17 NOTE — Telephone Encounter (Signed)
Spoke to pt regarding Jardiance assistance Environmental manager).  Per Phillips Hay, RPH-CPP, he is covered by this already, and he needs to go to his pharmacy and give them the information (sent in a my chart message to pt) in order for the Jardiance to be covered. Pt verbalized understanding.

## 2023-02-17 NOTE — Patient Instructions (Signed)
Visit Information  Thank you for taking time to visit with me today. Please don't hesitate to contact me if I can be of assistance to you before our next scheduled telephone appointment.  Your next appointment with the new nurse care manager Donn Pierini is by telephone on Wednesday 03/02/23 at 9:30 am  Please call the care guide team at [phone] if you need to cancel or reschedule your appointment.   Following are the goals we discussed today:  Patient Goals/Self-Care Activities: Participate in Transition of Care Program/Attend TOC scheduled calls Take all medications as prescribed Attend all scheduled provider appointments Call provider office for new concerns or questions  call office if I gain more than 2 pounds in one day or 5 pounds in one week track weight in diary use salt in moderation weigh myself daily know when to call the doctor: for any new or worsening shortness of breath or other concerns with your health; for overnight weight gain more than 3 lbs, or 5 lbs in one week  As the heart doctor advised: if you experience weight gain at home greater than 3 lbs overnight: please take an extra half- dose of your new fluid pill (Torsemide) If you need additional assistance in completing the Patient Assistance forms for Eliquis and Jardiance, please let the nurse care manager know- she is going to call you on Wednesday 03/02/23 after you have your scheduled procedure  If you are experiencing a Mental Health or Behavioral Health Crisis or need someone to talk to, please call the Suicide and Crisis Lifeline: 988 call the [place] National Suicide Prevention Lifeline: (970)093-4173 or TTY: 902-154-6071 TTY 605-772-7784) to talk to a trained counselor call 1-800-273-TALK (toll free, 24 hour hotline) go to [facility] [address] [place] [phone]) call the [facility] Crisis Line: [phone] call 911   Patient verbalizes understanding  of instructions and care plan provided today and agrees to view in MyChart. Active MyChart status and patient understanding of how to access instructions and care plan via MyChart confirmed with patient.     Caryl Pina, RN, BSN, Media planner  Transitions of Care  [facility] - [facility] [phone]: direct office

## 2023-02-17 NOTE — Telephone Encounter (Signed)
[employer] grant to cover costs of Jardiance requested.  Patient already has [employer] grant for cardiomyopathy - was using to get Entresto before it was stopped.  He needs to give the following information to his pharmacy to cover the [facility]  Card ID        [id number] BIN               [id number] PCN             [id number] GRP            [id number]   Current grant expires on 07/05/23.  He can call us in the 2 week period before it expires and we can renew (assuming the grant is still open)

## 2023-02-17 NOTE — Patient Instructions (Signed)
Medication Instructions:  Your physician recommends that you continue on your current medications as directed. Please refer to the Current Medication list given to you today.  *If you need a refill on your cardiac medications before your next appointment, please call your pharmacy*   Lab Work: BMET and CBC today  If you have labs (blood work) drawn today and your tests are completely normal, you will receive your results only by: MyChart Message (if you have MyChart) OR A paper copy in the mail If you have any lab test that is abnormal or we need to change your treatment, we will call you to review the results.   Testing/Procedures:     Dear Brandon [name]  You are scheduled for a TEE (Transesophageal Echocardiogram) Guided Cardioversion on Monday, December 16 with Dr. Olga Millers.  Please arrive at the [facility] at [facility]: [address] [place], [place] [address] at 11:00 AM (This time is 1 hour(s) before your procedure to ensure your preparation).   Free valet parking service is available. You will check in at ADMITTING.   *Please Note: You will receive a call the day before your procedure to confirm the appointment time. That time may have changed from the original time based on the schedule for that day.*    DIET:  Nothing to eat or drink after midnight except a sip of water with medications (see medication instructions below)  MEDICATION INSTRUCTIONS: !!IF ANY NEW MEDICATIONS ARE STARTED AFTER TODAY, PLEASE NOTIFY YOUR PROVIDER AS SOON AS POSSIBLE!!  FYI: Medications such as Semaglutide (Ozempic, [place]), Tirzepatide (Mounjaro, Zepbound), Dulaglutide (Trulicity), etc ("GLP1 agonists") AND Canagliflozin (Invokana), Dapagliflozin (Farxiga), Empagliflozin (Jardiance), Ertugliflozin (Steglatro), Bexagliflozin [employer]) or any combination with one of these drugs such as Invokamet (Canagliflozin/Metformin), Synjardy (Empagliflozin/Metformin), etc  ("SGLT2 inhibitors") must be held around the time of a procedure. This is not a comprehensive list of all of these drugs. Please review all of your medications and talk to your provider if you take any one of these. If you are not sure, ask your provider.   HOLD: Empagliflozin (Jardiance) for 3 days prior to the procedure. Last dose on Thursday, December 12.  Continue taking your anticoagulant (blood thinner): Apixaban (Eliquis).  You will need to continue this after your procedure until you are told by your provider that it is safe to stop.    HOLD: Torsemide (Demadex) day of procedure.  FYI:  For your safety, and to allow [place] to monitor your vital signs accurately during the surgery/procedure we request: If you have artificial nails, gel coating, SNS etc, please have those removed prior to your surgery/procedure. Not having the nail coverings /polish removed may result in cancellation or delay of your surgery/procedure.  Your support person will be asked to wait in the waiting room during your procedure.  It is OK to have someone drop you off and come back when you are ready to be discharged.  You cannot drive after the procedure and will need someone to drive you home.  Bring your insurance cards.  *Special Note: Every effort is made to have your procedure done on time. Occasionally there are emergencies that occur at the hospital that may cause delays. Please be patient if a delay does occur.       Follow-Up: At [facility], you and your health needs are our priority.  As part of our continuing mission to provide you with exceptional heart care, we have created designated  Provider Care Teams.  These Care Teams include your primary Cardiologist (physician) and Advanced Practice Providers (APPs -  Physician Assistants and Nurse Practitioners) who all work together to provide you with the care you need, when you need it.  We recommend signing up for the patient portal called  "MyChart".  Sign up information is provided on this After Visit Summary.  MyChart is used to connect with patients for Virtual Visits (Telemedicine).  Patients are able to view lab/test results, encounter notes, upcoming appointments, etc.  Non-urgent messages can be sent to your provider as well.   To learn more about what you can do with MyChart, go to [email or web address].    Your next appointment:   2 month(s)  Provider:   Marjie Skiff, PA-C

## 2023-02-21 ENCOUNTER — Other Ambulatory Visit: Payer: Self-pay | Admitting: Nephrology

## 2023-02-21 DIAGNOSIS — I129 Hypertensive chronic kidney disease with stage 1 through stage 4 chronic kidney disease, or unspecified chronic kidney disease: Secondary | ICD-10-CM | POA: Diagnosis not present

## 2023-02-21 DIAGNOSIS — N1831 Chronic kidney disease, stage 3a: Secondary | ICD-10-CM

## 2023-02-21 DIAGNOSIS — N1832 Chronic kidney disease, stage 3b: Secondary | ICD-10-CM | POA: Diagnosis not present

## 2023-02-21 DIAGNOSIS — I4891 Unspecified atrial fibrillation: Secondary | ICD-10-CM | POA: Diagnosis not present

## 2023-02-21 DIAGNOSIS — N179 Acute kidney failure, unspecified: Secondary | ICD-10-CM | POA: Diagnosis not present

## 2023-02-21 DIAGNOSIS — I5022 Chronic systolic (congestive) heart failure: Secondary | ICD-10-CM | POA: Diagnosis not present

## 2023-02-22 LAB — LAB REPORT - SCANNED
Creatinine, POC: 19.9 mg/dL
EGFR: 31

## 2023-02-25 NOTE — Progress Notes (Signed)
 Unable to reach patient about procedure, but was able to leave a detailed message. Stated that the patient needed to arrive at the hospital at 1100, remain NPO after 0000, needs to have a ride home and a responsible adult to stay with them for 24 hours after the procedure. Instructed the patient to call back if they had any questions.

## 2023-02-28 ENCOUNTER — Other Ambulatory Visit: Payer: Self-pay

## 2023-02-28 ENCOUNTER — Encounter (HOSPITAL_COMMUNITY): Payer: Self-pay | Admitting: Cardiology

## 2023-02-28 ENCOUNTER — Ambulatory Visit (HOSPITAL_COMMUNITY)
Admission: RE | Admit: 2023-02-28 | Discharge: 2023-02-28 | Disposition: A | Discharge status: Home / Self Care | Payer: Medicare Other | Attending: Cardiology | Admitting: Cardiology

## 2023-02-28 ENCOUNTER — Ambulatory Visit (HOSPITAL_COMMUNITY): Payer: Medicare Other | Admitting: Anesthesiology

## 2023-02-28 ENCOUNTER — Ambulatory Visit (HOSPITAL_BASED_OUTPATIENT_CLINIC_OR_DEPARTMENT_OTHER)
Admission: RE | Admit: 2023-02-28 | Discharge: 2023-02-28 | Disposition: A | Discharge status: Home / Self Care | Payer: Medicare Other | Source: Ambulatory Visit | Attending: Student | Admitting: Student

## 2023-02-28 ENCOUNTER — Encounter (HOSPITAL_COMMUNITY)
Admission: RE | Disposition: A | Discharge status: Home / Self Care | Payer: Self-pay | Source: Home / Self Care | Attending: Cardiology

## 2023-02-28 DIAGNOSIS — I340 Nonrheumatic mitral (valve) insufficiency: Secondary | ICD-10-CM | POA: Diagnosis not present

## 2023-02-28 DIAGNOSIS — K219 Gastro-esophageal reflux disease without esophagitis: Secondary | ICD-10-CM | POA: Diagnosis not present

## 2023-02-28 DIAGNOSIS — I428 Other cardiomyopathies: Secondary | ICD-10-CM | POA: Insufficient documentation

## 2023-02-28 DIAGNOSIS — G4733 Obstructive sleep apnea (adult) (pediatric): Secondary | ICD-10-CM | POA: Insufficient documentation

## 2023-02-28 DIAGNOSIS — I080 Rheumatic disorders of both mitral and aortic valves: Secondary | ICD-10-CM | POA: Insufficient documentation

## 2023-02-28 DIAGNOSIS — Z7901 Long term (current) use of anticoagulants: Secondary | ICD-10-CM | POA: Diagnosis not present

## 2023-02-28 DIAGNOSIS — N1831 Chronic kidney disease, stage 3a: Secondary | ICD-10-CM | POA: Diagnosis not present

## 2023-02-28 DIAGNOSIS — I4891 Unspecified atrial fibrillation: Secondary | ICD-10-CM

## 2023-02-28 DIAGNOSIS — I513 Intracardiac thrombosis, not elsewhere classified: Secondary | ICD-10-CM | POA: Insufficient documentation

## 2023-02-28 DIAGNOSIS — I4819 Other persistent atrial fibrillation: Secondary | ICD-10-CM | POA: Insufficient documentation

## 2023-02-28 DIAGNOSIS — D631 Anemia in chronic kidney disease: Secondary | ICD-10-CM | POA: Diagnosis not present

## 2023-02-28 DIAGNOSIS — Z8673 Personal history of transient ischemic attack (TIA), and cerebral infarction without residual deficits: Secondary | ICD-10-CM | POA: Insufficient documentation

## 2023-02-28 DIAGNOSIS — I130 Hypertensive heart and chronic kidney disease with heart failure and stage 1 through stage 4 chronic kidney disease, or unspecified chronic kidney disease: Secondary | ICD-10-CM | POA: Diagnosis not present

## 2023-02-28 DIAGNOSIS — G4730 Sleep apnea, unspecified: Secondary | ICD-10-CM | POA: Diagnosis not present

## 2023-02-28 DIAGNOSIS — I5022 Chronic systolic (congestive) heart failure: Secondary | ICD-10-CM | POA: Diagnosis not present

## 2023-02-28 DIAGNOSIS — I129 Hypertensive chronic kidney disease with stage 1 through stage 4 chronic kidney disease, or unspecified chronic kidney disease: Secondary | ICD-10-CM | POA: Diagnosis not present

## 2023-02-28 DIAGNOSIS — N1832 Chronic kidney disease, stage 3b: Secondary | ICD-10-CM | POA: Insufficient documentation

## 2023-02-28 DIAGNOSIS — I700 Atherosclerosis of aorta: Secondary | ICD-10-CM | POA: Insufficient documentation

## 2023-02-28 DIAGNOSIS — Z79899 Other long term (current) drug therapy: Secondary | ICD-10-CM | POA: Diagnosis not present

## 2023-02-28 HISTORY — PX: CARDIOVERSION: EP1203

## 2023-02-28 HISTORY — PX: TRANSESOPHAGEAL ECHOCARDIOGRAM (CATH LAB): EP1270

## 2023-02-28 LAB — ECHO TEE
AV Mean grad: 2 mm[Hg]
AV Peak grad: 4.3 mm[Hg]
Ao pk vel: 1.04 m/s

## 2023-02-28 SURGERY — TRANSESOPHAGEAL ECHOCARDIOGRAM (TEE) (CATHLAB)
Anesthesia: Monitor Anesthesia Care

## 2023-02-28 MED ORDER — PHENYLEPHRINE 80 MCG/ML (10ML) SYRINGE FOR IV PUSH (FOR BLOOD PRESSURE SUPPORT)
PREFILLED_SYRINGE | INTRAVENOUS | Status: DC | PRN
  Administered 2023-02-28 (×4): 80 ug via INTRAVENOUS

## 2023-02-28 MED ORDER — SODIUM CHLORIDE 0.9 % IV SOLN: INTRAVENOUS | Status: DC | PRN

## 2023-02-28 MED ORDER — PROPOFOL 500 MG/50ML IV EMUL
INTRAVENOUS | Status: DC | PRN
  Administered 2023-02-28: 20 mg via INTRAVENOUS
  Administered 2023-02-28: 30 mg via INTRAVENOUS
  Administered 2023-02-28: 20 mg via INTRAVENOUS
  Administered 2023-02-28: 50 mg via INTRAVENOUS

## 2023-02-28 SURGICAL SUPPLY — 1 items: PAD DEFIB RADIO PHYSIO CONN (PAD) ×1 IMPLANT

## 2023-02-28 NOTE — Anesthesia Preprocedure Evaluation (Signed)
Anesthesia Evaluation  Patient identified by MRN, date of birth, ID band Patient awake    Reviewed: Allergy & Precautions, NPO status , Patient's Chart, lab work & pertinent test results  Airway Mallampati: II  TM Distance: >3 FB Neck ROM: Full    Dental  (+) Missing   Pulmonary asthma , sleep apnea and Continuous Positive Airway Pressure Ventilation , former smoker   Pulmonary exam normal        Cardiovascular hypertension, Pt. on home beta blockers +CHF  Normal cardiovascular exam+ dysrhythmias Atrial Fibrillation + Valvular Problems/Murmurs MR   Thrombus of left atrial appendage   Neuro/Psych  PSYCHIATRIC DISORDERS  Depression    CVA    GI/Hepatic Neg liver ROS,GERD  Medicated and Controlled,,  Endo/Other  negative endocrine ROS    Renal/GU Renal disease     Musculoskeletal  (+) Arthritis ,    Abdominal   Peds  Hematology  (+) Blood dyscrasia (Eliquis)   Anesthesia Other Findings A-FIB  Reproductive/Obstetrics                              Anesthesia Physical Anesthesia Plan  ASA: 3  Anesthesia Plan: MAC   Post-op Pain Management:    Induction:   PONV Risk Score and Plan: 1 and Propofol infusion and Treatment may vary due to age or medical condition  Airway Management Planned: Nasal Cannula  Additional Equipment:   Intra-op Plan:   Post-operative Plan:   Informed Consent: I have reviewed the patients History and Physical, chart, labs and discussed the procedure including the risks, benefits and alternatives for the proposed anesthesia with the patient or authorized representative who has indicated his/her understanding and acceptance.       Plan Discussed with: CRNA  Anesthesia Plan Comments:          Anesthesia Quick Evaluation

## 2023-02-28 NOTE — Progress Notes (Addendum)
     Transesophageal Echocardiogram Note  Brandon [name] [id number] 02-18-1941  Procedure: Transesophageal Echocardiogram Indications: Atrial fibrillation  Procedure Details Consent: Obtained Time Out: Verified patient identification, verified procedure, site/side was marked, verified correct patient position, special equipment/implants available, Radiology Safety Procedures followed,  medications/allergies/relevent history reviewed, required imaging and test results available.  Performed  Medications:  Pt sedated by anesthesia with diprovan 120 mg IV total.  Severe global reduction in LV function; LVE; severe LAE; no LAA thrombus; mild RAE and RVE; trace AI; mild to moderate MR; mild TR.  Pt subsequently underwent DCCV with 200 J to sinus with PACS and PVCs. Continue anticoagulation.  Pt then developed SVT rate approximately 120; converted to sinus with carotid massage; increase toprol to 50 mg daily.   Complications: No apparent complications Patient did tolerate procedure well.  Olga Millers, MD

## 2023-02-28 NOTE — Transfer of Care (Signed)
Immediate Anesthesia Transfer of Care Note  Patient: Brandon [name]  Procedure(s) Performed: TRANSESOPHAGEAL ECHOCARDIOGRAM CARDIOVERSION  Patient Location: Cath Lab  Anesthesia Type:MAC  Level of Consciousness: awake, alert , and sedated  Airway & Oxygen Therapy: Patient Spontanous Breathing and Patient connected to nasal cannula oxygen  Post-op Assessment: Report given to RN and Post -op Vital signs reviewed and stable  Post vital signs: Reviewed and stable  Last Vitals:  Vitals Value Taken Time  BP 100/69   Temp 97.6   Pulse 122 02/28/23 1213  Resp 24 02/28/23 1213  SpO2 97 % 02/28/23 1213  Vitals shown include unfiled device data.  Last Pain:  Vitals:   02/28/23 1113  TempSrc: Temporal         Complications: No notable events documented.

## 2023-02-28 NOTE — Interval H&P Note (Signed)
History and Physical Interval Note:  02/28/2023 11:20 AM  Brandon [name]  has presented today for surgery, with the diagnosis of AFIB.  The various methods of treatment have been discussed with the patient and family. After consideration of risks, benefits and other options for treatment, the patient has consented to  Procedure(s): TRANSESOPHAGEAL ECHOCARDIOGRAM (N/A) CARDIOVERSION (N/A) as a surgical intervention.  The patient's history has been reviewed, patient examined, no change in status, stable for surgery.  I have reviewed the patient's chart and labs.  Questions were answered to the patient's satisfaction.     Olga Millers

## 2023-02-28 NOTE — Anesthesia Postprocedure Evaluation (Signed)
Anesthesia Post Note  Patient: Brandon [name]  Procedure(s) Performed: TRANSESOPHAGEAL ECHOCARDIOGRAM CARDIOVERSION     Patient location during evaluation: Cath Lab Anesthesia Type: MAC Level of consciousness: awake and alert Pain management: pain level controlled Vital Signs Assessment: post-procedure vital signs reviewed and stable Respiratory status: spontaneous breathing, nonlabored ventilation, respiratory function stable and patient connected to nasal cannula oxygen Cardiovascular status: blood pressure returned to baseline and stable Postop Assessment: no apparent nausea or vomiting Anesthetic complications: no   No notable events documented.  Last Vitals:  Vitals:   02/28/23 1235 02/28/23 1245  BP: 103/75 111/74  Pulse: 70 (!) 109  Resp: (!) 23 (!) 24  Temp:    SpO2: 100% 98%    Last Pain:  Vitals:   02/28/23 1230  TempSrc: Temporal  PainSc: 0-No pain                 Araiya Tilmon P Carlyann Placide

## 2023-02-28 NOTE — H&P (Signed)
Office Visit 02/17/2023  HeartCare at [facility], [place], [place] Cardiology Chronic systolic heart failure [facility]) +6 more Dx Referred by [name], NP Reason for Visit   Additional Documentation  Vitals: BP 110/62   Pulse 91   Ht 5\' 11"  (1.803 m)   Wt 89.8 kg   SpO2 97%   BMI 27.62 kg/m   BSA 2.12 m  Flowsheets: NEWS,   MEWS Score,   Vital Signs,   Anthropometrics,   Data  Encounter Info: Billing Info,   History,   Allergies,   Detailed Report   All Notes   Addendum Note by Morrell Riddle, RN at 02/17/2023 10:30 AM  Author: Morrell Riddle, RN Author Type: Registered Nurse Filed: 02/17/2023  4:53 PM  Note Status: Signed Cosign: Cosign Not Required Encounter Date: 02/17/2023  Editor: Morrell Riddle, RN (Registered Nurse)             Addended by: Morrell Riddle on: 02/17/2023 04:53 PM     Modules accepted: Orders        Progress Notes by Corrin Parker, PA-C at 02/17/2023 10:30 AM  Author: Corrin Parker, PA-C Author Type: Physician Assistant Certified Filed: 02/17/2023 12:56 PM  Note Status: Signed Cosign: Cosign Not Required Encounter Date: 02/17/2023  Editor: Smitty Knudsen (Physician Assistant Certified)             Expand All Collapse All    Cardiology Office Note:     Date:  02/17/2023    ID:  Brandon [name], DOB [birth year], MRN [id number]   PCP:  [name], NP             Cardiologist:  [name], MD      Referring MD: [name], NP    Chief Complaint: hospital follow-up of CHF and atrial fibrillation   History of Present Illness:     Brandon [name] is a 82 y.o. 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 Antoine Poche and presents today for hospital follow-up of CHF and atrial fibrillation.   Patient has a long history of non-ischemic cardiomyopathy/ HFrEF. Cardiac catheterization was reportedly negative for obstructive CAD in the past. Last Echo in 04/2022 showed LVEF of 45-50% with global hypokinesis.    He was last seen by Joni Reining on 01/10/2023. at which time he reported worsening lower extremity edema over the last 2 weeks as well as mild dyspnea on exertion. He did increase his Lasix for 3 days but did not have any appreciable diuresis with this. He denied any chest pain or palpitations. He was noted to be in new onset rate controlled atrial fibrillation and acute CHF. He was started on anticoagulation with Eliquis and was directly admitted from the office for IV diuresis. Echo showed LVEF of 20-25% with global hypokinesis, moderately enlarged RV with normal RV function and moderately elevated PASP of 48.4 mmHg, and moderate to severe MR. He was diuresed with IV Lasix but this was stopped on 10/29 due to worsening renal function. 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 Initial plan was for a TEE/ DCCV on 01/14/2023. However, TEE showed a severely dilated left atrium with a left atrial appendage thrombus so DCCV was not performed. He was discharged on Lasix 20mg  daily. GDMT was limited by soft BP - home Entresto and Spironolactone were stopped and Toprol-XL was decreased but he was started on Jardiance 10mg  daily. Of note, LHC was not performed due to renal function. He was referred to Nephrology as an outpatient.   He was seen by Tonye Becket, NP in the CHF clinic, on 01/28/2023 for follow-up at which time he did report some shortness of breath and was noted  ot be volume overloaded. Lasix was increased to 80mg  daily.  He was seen by Tonye Becket, NP, again on 02/01/2023 at which time he reported significant increased in urine output on the higher dose of Lasix and still reported some shortness of breath when walking up steps. His volume status had improved some but he still looked volume overloaded. Therefore, Lasix was stopped and he was started on Torsemide instead.    Patient presents today for follow-up. Here with his wife. He is overall doing well since transitioning from Lasix to Torsemide. He reports better urinary output on this. He also reports some improvement in his shortness of breath but still has some dyspnea when walking for prolonged periods of time or walking up/ down the stairs. No shortness of breath at rest. He has chronic stable orthopnea since admission in 12/2022. No PND. He continues to have chronic lower extremity edema. He states the left leg usually is larger than the right but states today the right leg looks bigger. He denies any weight gain and states he has has been slowly losing weight. He denies any pain in his legs. He denies any chest pain. He is asymptomatic with his atrial fibrillation and denies any palpitations, lightheadedness, dizziness, or syncope.    EKGs/Labs/Other Studies Reviewed:     The following studies were reviewed:   TTE 01/11/2023: Impressions:  1. Left ventricular ejection fraction, by estimation, is 20 to 25%. The  left ventricle has severely decreased function. The left ventricle  demonstrates global hypokinesis. The left ventricular internal cavity size  was severely dilated. Left ventricular  diastolic function could not be evaluated.   2. Right ventricular systolic function is normal. The right ventricular  size is moderately enlarged. There is moderately elevated pulmonary artery  systolic pressure. The estimated right ventricular systolic pressure is  48.4 mmHg.   3. Left atrial size was  severely dilated.   4. Right atrial size was moderately dilated.   5. The mitral valve is normal in structure. Moderate to severe mitral  valve regurgitation. No evidence of mitral stenosis.   6. The aortic valve is tricuspid. Aortic valve regurgitation is trivial.  Aortic valve sclerosis/calcification is present, without any evidence of  aortic stenosis. Aortic valve area, by VTI measures 1.80 cm. Aortic valve  mean gradient measures 2.2 mmHg.   Aortic valve Vmax measures 1.06 m/s.   7. The inferior vena cava is normal in size with <50% respiratory  variability, suggesting right atrial pressure of 8 mmHg.  _____________   Right Cardiac Catheterization 01/13/2023: Hemodynamics: RA:                  5 mmHg (mean) RV:                  40/3-3-5 mmHg PA:  39/24 mmHg (30 mean) PCWP:            14 mmHg (mean)                                      Estimated Fick CO/CI   5.2 L/min, 2.4 L/min/m2 Thermodilution CO/CI   4.3 L/min, 2 L/min/m2                                                 TPG                 16  mmHg                                            PVR                 3-3.7 Wood Units  PAPi                3       Impression: Normal pre and post capillary filling pressures.  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  Mild to moderately reduced cardiac output / index; reduced by thermodilution.  _______________   TEE/ DCCV 01/14/2023: Impressions: 1. Left ventricular ejection fraction, by estimation, is 20 to 25%. The  left ventricle has severely decreased function. The left ventricle  demonstrates global hypokinesis. The left ventricular internal cavity size  was severely dilated. Left ventricular  diastolic function could not be evaluated.   2. Right ventricular systolic function is mildly reduced. The right  ventricular size is mildly enlarged. There is normal pulmonary artery  systolic pressure. The  estimated right ventricular systolic pressure is  24.6 mmHg.   3. There is a 9 mm x 4 mm mobile thrombus in the mid left atrial  appendage. Left atrial size was severely dilated. A left atrial/left  atrial appendage thrombus was detected.   4. Right atrial size was moderately dilated.   5. The mitral valve is normal in structure. Mild to moderate mitral valve  regurgitation.   6. The aortic valve is tricuspid. Aortic valve regurgitation is trivial.  7. No aortic stenosis is present.    DCCV not performed due to LAA thrombus.   EKG:  EKG ordered today.    EKG Interpretation Date/Time:                  Thursday February 17 2023 10:39:33 EST Ventricular Rate:         91 PR Interval:                   QRS Duration:             148 QT Interval:                 410 QTC Calculation:504 R Axis:                         -86   Text Interpretation:Atrial fibrillation with premature ventricular or aberrantly conducted complexes Right bundle branch block Left anterior fascicular block Bifascicular block Septal infarct ,  age undetermined When compared with ECG of 28-Jan-2023 14:29, No significant change since last tracing Confirmed by Marjie Skiff [phone]) on 02/17/2023 11:28:32 AM        Recent Labs: 03/22/2022: Pro B Natriuretic peptide (BNP) 1,464.0 01/10/2023: ALT 8 01/11/2023: TSH 2.296 01/14/2023: Magnesium 1.9 01/15/2023: Hemoglobin 10.1; Platelets 136 02/01/2023: BUN 20; Creatinine, Ser 2.07; Potassium 3.8; Sodium 138  Recent Lipid Panel Labs (Brief)          Component Value Date/Time    CHOL 129 09/21/2022 1534    TRIG 78.0 09/21/2022 1534    HDL 53.30 09/21/2022 1534    CHOLHDL 2 09/21/2022 1534    VLDL 15.6 09/21/2022 1534    LDLCALC 60 09/21/2022 1534        Physical Exam:     Vital Signs: BP 110/62   Pulse 91   Ht 5\' 11"  (1.803 m)   Wt 198 lb (89.8 kg)   SpO2 97%   BMI 27.62 kg/m         Wt Readings from Last 3 Encounters:  02/17/23 198 lb (89.8 kg)  02/01/23  201 lb 1.6 oz (91.2 kg)  01/28/23 207 lb 3.2 oz (94 kg)      General: 82 y.o. African-American male in no acute distress. HEENT: Normocephalic and atraumatic. Sclera clear.  Neck: Supple. No carotid bruits. No JVD. Heart: Irregularly irregular rhythm with normal rate. Distinct S1 and S2. No murmurs, gallops, or rubs.  Lungs: No increased work of breathing. Clear to ausculation bilaterally. No wheezes, rhonchi, or rales.  Extremities: Tight 1-2+ lower extremity edema bilaterally (right > left). Skin: Warm and dry. Neuro: No focal deficits. Psych: Normal affect. Responds appropriately.     Assessment:     1. Chronic systolic heart failure (HCC)   2. Persistent atrial fibrillation (HCC)   3. Thrombus of left atrial appendage   4. Mitral valve insufficiency, unspecified etiology   5. Primary hypertension   6. Stage 3b chronic kidney disease (HCC)   7. Obstructive sleep apnea       Plan:     Chronic HFrEF Patient has a long history of non-ischemic cardiomyopathy/ HFrEF. Cardiac catheterization was reportedly negative for obstructive CAD in the past. He was recently admitted at the end of 12/2022 for acute CHF and new onset atrial fibrillation. Echo at that time showed LVEF of 20-25% (down from 45-50% in 04/2022) with global hypokinesis, moderately enlarged RV with normal RV function and moderately elevated PASP of 48.4 mmHg, and moderate to severe MR. 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 - He continues to have lower extremity edema but overall feeling better and weights slowly coming down.  - Continue Torsemide 40mg  daily. Can continue to take an extra 20mg  as needed for weight gain. - Continue Toprol-XL 25mg  daily. - Continue Jardiance 10mg  daily. He is having trouble affording this  right now. Will provide samples today and Patient Assistance forms.  - No ACEi/ ARB/ ARNI or MRA due to renal function. - Will repeat BMET today.  - Discussed importance of daily weights and sodium/ fluid restrictions.  - Etiology of significant drop of EF unclear. Possibly atrial fibrillation but he was rate controlled on presentation. Cannot rule out ischemia. LHC was not performed this admission due to renal function. Will plan to repeat Echo in about 3 months  after optimization of GDMT. If no improvement in EF, may need to consider repeat ischemic evaluation.    Persistently Atrial Fibrillation Left Atrial Appendage Thrombus Diagnosed in 12/2022.  TEE on 01/14/2023 showed left atrial appendage thrombus; therefore, DCCV was not performed.  - Rate controlled.  - Continue Toprol-XL 25mg  daily.  - Continue chronic anticoagulation with Eliquis 2.5mg  twice daily (reduced dose due to age and renal function). Will provide patient Assistance forms.  - Will plan to repeat TEE/ DCCV later this month. He denies missing any doses of his Eliquis in the last month. Will check pre-procedural labs (CBC and BMET) today.    Shared Decision Making/Informed Consent{ The risks [stroke, cardiac arrhythmias rarely resulting in the need for a temporary or permanent pacemaker, skin irritation or burns, esophageal damage, perforation (1:10,000 risk), bleeding, pharyngeal hematoma as well as other potential complications associated with conscious sedation including aspiration, arrhythmia, respiratory failure and death], benefits (treatment guidance, restoration of normal sinus rhythm, diagnostic support) and alternatives of a transesophageal echocardiogram guided cardioversion were discussed in detail with Brandon [name] and he is willing to proceed.    Mitral Regurgitation TTE on 01/11/2023 showed moderate to severe MR, but TEE on 01/14/2023 showed mild to moderate MR.   Hypertension BP well controlled. - Continue  medications for CHF as above.   CKD Stage IIIb Baseline creatinine prior to recent admission in 12/2022 was 1.3 to 1.4. However, creatinien has been around 1.8 to 2.0 since discharge. - Will repeat BMET today.    Obstructive Sleep Apnea - Continue CPAP.    Disposition: Follow up in about 1 month after TEE/ DCCV     Signed, Corrin Parker, PA-C  02/17/2023 11:49 AM    [facility]      For TEE/DCCV; compliant with apixaban; no changes Olga Millers

## 2023-03-01 ENCOUNTER — Encounter (HOSPITAL_COMMUNITY): Payer: Self-pay | Admitting: Cardiology

## 2023-03-02 ENCOUNTER — Ambulatory Visit: Payer: Self-pay

## 2023-03-02 ENCOUNTER — Telehealth: Payer: Self-pay | Admitting: Student

## 2023-03-02 NOTE — Patient Instructions (Signed)
Visit Information  Thank you for taking time to visit with me today. Please don't hesitate to contact me if I can be of assistance to you.   Following are the goals we discussed today:  Take Eliquis assistance forms to cardiology office as discussed this week. Continue to take medications as prescribed. Contact your provider with any questions or concerns about your medications. Continue to attend provider visits as scheduled Continue to eat healthy, lean meats, vegetables, fruits, avoid saturated and transfats Continue to weigh and record weights, notify provider with any signs/symptoms of HF exacerbation as previously discussed Contact provider with health questions or concerns as needed   Our next appointment is by telephone on 03/14/23 at 1:45 pm  Please call the care guide team at [phone] if you need to cancel or reschedule your appointment.   If you are experiencing a Mental Health or Behavioral Health Crisis or need someone to talk to, please call the Suicide and Crisis Lifeline: 988 call the [place] National Suicide Prevention Lifeline: 786 066 0158 or TTY: 279-695-6734 TTY [phone]) to talk to a trained counselor   [name], RN, MSN, BSN, CCM Care Management Coordinator [phone]

## 2023-03-02 NOTE — Patient Outreach (Signed)
  Care Coordination   Initial Visit Note   03/02/2023 Name: Brandon [name] MRN: [id number] DOB: 1940-07-08  Brandon [name] is a 82 y.o. year old male who sees [name], NP for primary care. I spoke with  Brandon [name] by phone today.  What matters to the patients health and wellness today?  [name] reports he is doing ok since TEE/Cardioversion. He denies any health questions or concerns at this time. He reports he is weighing self daily and weight has not changed. He states he has all his medications. Patient reports pre-post instructions provided by cardiologist regarding Jardiance. Patient states he will call cardiology provider, Marjie Skiff, PA, regarding restart of Jardiance to confirm. And states he is going to take the Eliquis assistance form back to cardiology office this week.   Goals Addressed             This Visit's Progress    Assist with health management       Interventions Today    Flowsheet Row Most Recent Value  Chronic Disease   Chronic disease during today's visit Atrial Fibrillation (AFib), Other, Congestive Heart Failure (CHF)  [TEE/cardioversion 02/28/23]  General Interventions   General Interventions Discussed/Reviewed General Interventions Discussed, Doctor Visits  Doctor Visits Discussed/Reviewed PCP, Specialist  PCP/Specialist Visits Compliance with follow-up visit  [reviewed upcoming appointments including cardiology and PCP January 2025 and nephrology scheduled March 2025.]  Exercise Interventions   Exercise Discussed/Reviewed Physical Activity  Physical Activity Discussed/Reviewed Physical Activity Discussed  [pre after visit summary 02/28/23]  Education Interventions   Education Provided Provided Education  [reviewed with patient after visit summary from 02/28/23 cardioversion]  Provided Verbal Education On Nutrition, Medication, When to see the doctor  [advised to take medications as prescribed, contact provider with any questions  or concerns, continue to weigh and record daily weights/monitor for signs/symptoms of HF exacerbation contact provider with any health questions or concerns.]  Nutrition Interventions   Nutrition Discussed/Reviewed Nutrition Discussed  [discussed diet heart healthy, low salt per after visit summary 02/28/23]  Pharmacy Interventions   Pharmacy Dicussed/Reviewed Pharmacy Topics Discussed  [confirmed patient has completed Jardiance application process. And has completed Eliquis form, but has not submitted. RNCM encouraged patient to submit form asap]            SDOH assessments and interventions completed:  Yes  SDOH Interventions Today    Flowsheet Row Most Recent Value  SDOH Interventions   Food Insecurity Interventions Intervention Not Indicated  Housing Interventions Intervention Not Indicated  Transportation Interventions Intervention Not Indicated  Utilities Interventions Intervention Not Indicated     Care Coordination Interventions:  Yes, provided   Follow up plan: Follow up call scheduled for 03/14/23    Encounter Outcome:  Patient Visit Completed   [name], RN, MSN, BSN, CCM Care Management Coordinator [phone]

## 2023-03-02 NOTE — Telephone Encounter (Signed)
  Pt would like to f/u with Marjie Skiff, what he need to do since he got his TEE recently and if he needs to see her sooner than February

## 2023-03-03 NOTE — Telephone Encounter (Signed)
He should just continue his current medications. It is especially important that he doesn't miss any doses of his Eliquis in the first 4 weeks after his cardioversion. And then I just recommended he follow-up with me about 1 month after his TEE/ DCCV. He already has something scheduled for early February which is fine as long as he is feeling okay.  Thanks! Rose-Marie Hickling

## 2023-03-03 NOTE — Telephone Encounter (Signed)
Pt informed of providers result & recommendations. Pt verbalized understanding. All questions, if any, were answered.  PT STATES THAT HE IS "FEELING FINE" HE WILL CALL BACK IF ANYTHING CHANGES

## 2023-03-04 ENCOUNTER — Other Ambulatory Visit: Payer: Self-pay | Admitting: Internal Medicine

## 2023-03-07 ENCOUNTER — Other Ambulatory Visit (HOSPITAL_COMMUNITY): Payer: Self-pay

## 2023-03-14 ENCOUNTER — Ambulatory Visit: Payer: Self-pay

## 2023-03-14 NOTE — Patient Instructions (Signed)
Visit Information  Thank you for taking time to visit with me today. Please don't hesitate to contact me if I can be of assistance to you.   Following are the goals we discussed today:  Continue to take medications as prescribed. Continue to attend provider visits as scheduled Continue to eat healthy, lean meats, vegetables, fruits, avoid saturated and transfats Contact provider with health questions or concerns as needed  Our next appointment is by telephone on 04/01/23 at 1:15 pm  Please call the care guide team at [phone] if you need to cancel or reschedule your appointment.   If you are experiencing a Mental Health or Behavioral Health Crisis or need someone to talk to, please call the Suicide and Crisis Lifeline: 988 call the [place] National Suicide Prevention Lifeline: (802) 159-7719 or TTY: (727)690-8381 TTY (817)748-3839) to talk to a trained counselor  [name], RN, MSN, BSN, CCM Care Management Coordinator [phone]

## 2023-03-14 NOTE — Patient Outreach (Signed)
  Care Coordination   Follow Up Visit Note   03/14/2023 Name: Brandon [name] MRN: [id number] DOB: 03-03-1941  Brandon [name] is a 82 y.o. year old male who sees [name], NP for primary care. I spoke with  [name] by phone today.  What matters to the patients health and wellness today?  [name] reports he continues to do well. Reports he has all his medications and is taking as prescribed. He denies any questions or concerns at this time.  Goals Addressed             This Visit's Progress    Assist with health management       Interventions Today    Flowsheet Row Most Recent Value  Chronic Disease   Chronic disease during today's visit Congestive Heart Failure (CHF), Atrial Fibrillation (AFib)  [TEE/cardioversion 02/28/23]  General Interventions   General Interventions Discussed/Reviewed General Interventions Reviewed, Doctor Visits  [Evaluation of current treatment plan for health condition and patient's adherence to plan.]  Doctor Visits Discussed/Reviewed PCP, Specialist  PCP/Specialist Visits Compliance with follow-up visit  [upcoming scheduled appointments reviewed]  Education Interventions   Education Provided Provided Education  [reviewed provider advise from 03/02/23 with patient]  Provided Verbal Education On When to see the doctor, Medication  [advised to continue to take medications as prescribed, attend provider visits as scheduled.]  Pharmacy Interventions   Pharmacy Dicussed/Reviewed Pharmacy Topics Reviewed            SDOH assessments and interventions completed:  No  Care Coordination Interventions:  Yes, provided   Follow up plan: Follow up call scheduled for 04/01/23    Encounter Outcome:  Patient Visit Completed   [name], RN, MSN, BSN, CCM Care Management Coordinator [phone]

## 2023-03-21 ENCOUNTER — Other Ambulatory Visit: Payer: Medicare Other

## 2023-03-21 ENCOUNTER — Other Ambulatory Visit: Payer: Self-pay | Admitting: Family

## 2023-03-22 ENCOUNTER — Other Ambulatory Visit: Payer: Self-pay | Admitting: Family

## 2023-03-23 ENCOUNTER — Ambulatory Visit
Admission: RE | Admit: 2023-03-23 | Discharge: 2023-03-23 | Disposition: A | Discharge status: Home / Self Care | Payer: Medicare Other | Source: Ambulatory Visit | Attending: Nephrology | Admitting: Nephrology

## 2023-03-23 DIAGNOSIS — N189 Chronic kidney disease, unspecified: Secondary | ICD-10-CM | POA: Diagnosis not present

## 2023-03-23 DIAGNOSIS — N1831 Chronic kidney disease, stage 3a: Secondary | ICD-10-CM

## 2023-03-30 DIAGNOSIS — M25522 Pain in left elbow: Secondary | ICD-10-CM | POA: Diagnosis not present

## 2023-03-30 DIAGNOSIS — M25552 Pain in left hip: Secondary | ICD-10-CM | POA: Diagnosis not present

## 2023-03-30 DIAGNOSIS — M5136 Other intervertebral disc degeneration, lumbar region with discogenic back pain only: Secondary | ICD-10-CM | POA: Diagnosis not present

## 2023-03-30 DIAGNOSIS — N1832 Chronic kidney disease, stage 3b: Secondary | ICD-10-CM | POA: Diagnosis not present

## 2023-03-30 DIAGNOSIS — R21 Rash and other nonspecific skin eruption: Secondary | ICD-10-CM | POA: Diagnosis not present

## 2023-03-30 DIAGNOSIS — Z79899 Other long term (current) drug therapy: Secondary | ICD-10-CM | POA: Diagnosis not present

## 2023-03-30 DIAGNOSIS — M0579 Rheumatoid arthritis with rheumatoid factor of multiple sites without organ or systems involvement: Secondary | ICD-10-CM | POA: Diagnosis not present

## 2023-03-31 LAB — LAB REPORT - SCANNED: EGFR: 35

## 2023-04-01 DIAGNOSIS — M1612 Unilateral primary osteoarthritis, left hip: Secondary | ICD-10-CM | POA: Diagnosis not present

## 2023-04-01 DIAGNOSIS — M545 Low back pain, unspecified: Secondary | ICD-10-CM | POA: Diagnosis not present

## 2023-04-06 ENCOUNTER — Telehealth: Payer: Self-pay | Admitting: Cardiology

## 2023-04-06 NOTE — Telephone Encounter (Signed)
Pt reports shortness of breath for 2 days. Notices it when he is moving around. Has been using rescue inhaler- reports having asthma- the inhaler does help with the asthma, though.  Reports no dizziness, no swelling, no fainting. He is able to talk and eat without being short of breath.   Reports that he started taking the Jardiance in December- reading up on it and saw that shortness of breath is listed as a side effect of Jardiance.    He would like a sooner appt with Callie- earliest appt is 04/15/23. Offered earlier appt if needed with other APP's, but the patient reports that he does not feel like he needs one sooner than that. Instructed him to call if he feels he needs a sooner appt and if he feels like sob is getting worse. Went over ER precautions, he verbalized understanding.

## 2023-04-06 NOTE — Telephone Encounter (Signed)
Pt c/o Shortness Of Breath: STAT if SOB developed within the last 24 hours or pt is noticeably SOB on the phone  1. Are you currently SOB (can you hear that pt is SOB on the phone)? On/off  2. How long have you been experiencing SOB? x2days  3. Are you SOB when sitting or when up moving around? Moving around  4. Are you currently experiencing any other symptoms? No  Pt is currently taking Jardiance and read that it was a SE of the medication. Requesting cb to discuss

## 2023-04-07 NOTE — Progress Notes (Addendum)
Cardiology Office Note:    Date:  04/15/2023   ID:  Brandon [name], DOB [birth year], MRN [id number]  PCP:  [name], NP  Cardiologist:  [name], MD     Referring MD: [name], NP   Chief Complaint: shortness of breath  History of Present Illness:    Brandon [name] is a 83 y.o. 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 Antoine Poche and presents today for further evaluation of shortness of breath.  Patient has a long history of non-ischemic cardiomyopathy/ HFrEF. Cardiac catheterization was reportedly negative for obstructive CAD in the past. Last Echo in 04/2022 showed LVEF of 45-50% with global hypokinesis.   At office visit in 12/2022, he was noted to be in new onset rate controlled atrial fibrillation and acute CHF. He was started on anticoagulation with Eliquis and was directly admitted from the office for IV diuresis. Echo showed LVEF of 20-25% with global hypokinesis, moderately enlarged RV with normal RV function and moderately elevated PASP of 48.4 mmHg, and moderate to severe MR. He was diuresed with IV Lasix but this was stopped on 10/29 due to worsening renal function. 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 Initial plan was for a TEE/ DCCV on 01/14/2023. However, TEE showed a severely dilated left atrium with a left atrial appendage thrombus so DCCV was not performed. Of note, LHC was not performed due to renal function. He was referred to Nephrology as an outpatient.    He was last seen by me on 02/17/2023 at which time he was doing well overall after transitioning form Lasix to Torsemide. Dyspnea had improved. Repeat TEE/ DCCV was arranged. He presented for this on 02/28/2023. TEE showed resolution of left atrial appendage thrombus and he was successful cardioverted back to sinus rhythm. EF 25-30% on TEE.  Patient called our office on 04/06/2023 with reports of worsening shortness of breath. Therefore, this visit was arranged for further evaluation. He is here with his wife.  He reports significant dyspnea with minimal exertion that has gotten progressively worse over the last month.  It started with dyspnea when walking up the stairs but now he has it when walking short distances on a flat surface as well.  He has chronic orthopnea but has had to sleep on a larger incline over the last month as well.  In addition, he reports significant weakness as well as lightheadedness and heart racing at times.  EKG today shows that he is back in rapid atrial fibrillation with rates in the 110s.  He denies any chest pain.  He has chronic lower extremity edema (left greater than right) but this is actually improved.  His weight is stable from last vi.sit but he does report early satiety.  No PND.  He has been compliant with his Eliquis and has not missed any doses over the last month.  EKGs/Labs/Other Studies Reviewed:    The following studies were reviewed:  TTE 01/11/2023: Impressions:  1. Left ventricular ejection fraction, by estimation, is 20 to 25%. The  left ventricle has severely  decreased function. The left ventricle  demonstrates global hypokinesis. The left ventricular internal cavity size  was severely dilated. Left ventricular  diastolic function could not be evaluated.   2. Right ventricular systolic function is normal. The right ventricular  size is moderately enlarged. There is moderately elevated pulmonary artery  systolic pressure. The estimated right  ventricular systolic pressure is  48.4 mmHg.   3. Left atrial size was severely dilated.   4. Right atrial size was moderately dilated.   5. The mitral valve is normal in structure. Moderate to severe mitral  valve regurgitation. No evidence of mitral stenosis.   6. The aortic valve is tricuspid. Aortic valve regurgitation is trivial.  Aortic valve sclerosis/calcification is present, without any evidence of  aortic stenosis. Aortic valve area, by VTI measures 1.80 cm. Aortic valve  mean gradient measures 2.2 mmHg.   Aortic valve Vmax measures 1.06 m/s.   7. The inferior vena cava is normal in size with <50% respiratory  variability, suggesting right atrial pressure of 8 mmHg.  _____________   Right Cardiac Catheterization 01/13/2023: Hemodynamics: RA:                  5 mmHg (mean) RV:                  40/3-3-5 mmHg PA:                  39/24 mmHg (30 mean) PCWP:            14 mmHg (mean)                                      Estimated Fick CO/CI   5.2 L/min, 2.4 L/min/m2 Thermodilution CO/CI   4.3 L/min, 2 L/min/m2                                                 TPG                 16  mmHg                                            PVR                 3-3.7 Wood Units  PAPi                3       Impression: Normal pre and post capillary filling pressures.  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  Mild to moderately reduced cardiac output / index; reduced by thermodilution.  _______________   TEE/ DCCV 01/14/2023: Impressions: 1. Left ventricular ejection fraction, by estimation, is 20 to 25%. The  left ventricle has severely decreased function. The left ventricle  demonstrates global hypokinesis. The left ventricular internal cavity size  was severely dilated. Left ventricular  diastolic function could not be evaluated.   2. Right ventricular systolic function is mildly reduced. The right  ventricular size is  mildly enlarged. There is normal pulmonary artery  systolic pressure. The estimated right ventricular systolic pressure is  24.6  mmHg.   3. There is a 9 mm x 4 mm mobile thrombus in the mid left atrial  appendage. Left atrial size was severely dilated. A left atrial/left  atrial appendage thrombus was detected.   4. Right atrial size was moderately dilated.   5. The mitral valve is normal in structure. Mild to moderate mitral valve  regurgitation.   6. The aortic valve is tricuspid. Aortic valve regurgitation is trivial.  7. No aortic stenosis is present.    DCCV not performed due to LAA thrombus. _______________  TEE/ DCCV 02/28/2023: Impressions: 1. Left ventricular ejection fraction, by estimation, is 25 to 30%. The  left ventricle has severely decreased function. The left ventricle  demonstrates global hypokinesis. The left ventricular internal cavity size  was moderately dilated.   2. Right ventricular systolic function is mildly reduced. The right  ventricular size is mildly enlarged.   3. Left atrial size was severely dilated. No left atrial/left atrial  appendage thrombus was detected.   4. Right atrial size was mildly dilated.   5. The mitral valve is normal in structure. Mild to moderate mitral valve  regurgitation.   6. The aortic valve is tricuspid. Aortic valve regurgitation is trivial.   7. There is mild (Grade II) plaque involving the descending aorta.   A successful direct current cardioversion was performed at 200 joules with 1 attempt.   EKG:  EKG ordered today.  EKG Interpretation Date/Time:  Friday April 15 2023 14:34:03 EST Ventricular Rate:  119 PR Interval:    QRS Duration:  144 QT Interval:  370 QTC Calculation: 520 R Axis:   -38  Text Interpretation: Atrial fibrillation with rapid ventricular response with premature ventricular or aberrantly conducted complexes Left axis deviation Right bundle branch block Slight ST depression in anterior leads  with T wave inversions in leads V2-V3 Confirmed by Marjie Skiff [phone]) on 04/15/2023 4:34:19 PM    Recent Labs: 01/10/2023: ALT 8 01/11/2023: TSH 2.296 01/14/2023: Magnesium 1.9 01/15/2023: Hemoglobin 10.1; Platelets 136 02/01/2023: BUN 20; Creatinine, Ser 2.07; Potassium 3.8; Sodium 138  Recent Lipid Panel    Component Value Date/Time   CHOL 129 09/21/2022 1534   TRIG 78.0 09/21/2022 1534   HDL 53.30 09/21/2022 1534   CHOLHDL 2 09/21/2022 1534   VLDL 15.6 09/21/2022 1534   LDLCALC 60 09/21/2022 1534    Physical Exam:    Vital Signs: BP 90/70 (BP Location: Left Arm, Patient Position: Sitting, Cuff Size: Normal)   Pulse (!) 119   Ht 5\' 11"  (1.803 m)   Wt 197 lb (89.4 kg)   BMI 27.48 kg/m     Wt Readings from Last 3 Encounters:  04/15/23 197 lb (89.4 kg)  04/15/23 197 lb (89.4 kg)  02/28/23 195 lb (88.5 kg)     General: 83 y.o. African-American male in no acute distress. HEENT: Normocephalic and atraumatic. Sclera clear.  Neck: Supple. Elevated JVD. Heart: Tachycardic with irregularly irregular rhythm. No murmurs, gallops, or rubs.  Lungs: No increased work of breathing. Clear to ausculation bilaterally. No wheezes, rhonchi, or rales.  Abdomen: Soft, non-distended, and non-tender to palpation.  Extremities: 1+ pitting edema of bilateral lower extremities (left > right). Skin: Warm and dry. Neuro: No focal deficits. Psych: Normal affect. Responds appropriately.  Assessment:    1. Dyspnea, unspecified type   2. Chronic HFrEF (heart failure with reduced ejection fraction) (HCC)   3. Essential hypertension   4. Persistent atrial fibrillation (HCC)   5. Mitral valve insufficiency, unspecified etiology  Plan:    Persistent Atrial Fibrillation History Left Atrial Appendage Thrombus Diagnosed during admission in 12/2022.  TEE on 01/14/2023 showed left atrial appendage thrombus; therefore, DCCV was not performed. Repeat TEE on 02/28/2023 showed resolution of  thrombus so he underwent successful DCCV at that time. - He is unfortunately back in rapid atrial fibrillation today with rates in the 100s. He is very symptomatic with this. BP soft at 90.70.  - Continue Toprol-XL 37.5mg  daily.  - Continue chronic anticoagulation with Eliquis 2.5mg  twice daily (reduced dose due to age and renal function).  - Recommend going to the ED for IV Amiodarone with possible cardioversion if he does not convert on the Amiodarone. He has not missed any doses of his Eliquis in the last month. Left atrium was severely dilated on TEE in 02/2023 so unlikely that he will maintain sinus rhythm without an antiarrythmic.  - Discussed this plan with DOD (Dr. Herbie Baltimore) who agreed.   Acute on Chronic HFrEF Patient has a long history of non-ischemic cardiomyopathy/ HFrEF. Cardiac catheterization was reportedly negative for obstructive CAD in the past. TTE in 12/2022 during an admission for CHF and atrial fibrillation showed LVEF of 20-25% (down from 45-50% in 04/2022) with global hypokinesis, moderately enlarged RV with normal RV function and moderately elevated PASP of 48.4 mmHg, and moderate to severe MR. 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 TEE in 02/2023 showed LVEF of 25-30%.  - He reports significant dyspnea on exertion as well as orthopnea and early satiety over the last month. He does have signs of volume overload - JVD elevated. Suspect exacerbation is secondary to recurrent atrial fibrillation with RVR. - Recommend checking BNP and BMET in the ED. - Currently on Torsemide 40mg  daily. However, he will benefit from IV Lasix in the ED if BP will allow. - Continue Toprol-XL 37.5mg  daily. - Continue Jardiance 10mg  daily.  - No ACEi/ ARB/ ARNI or MRA due to renal function.    Mitral Regurgitation TTE on 01/11/2023 showed moderate to severe MR, but considered mild to moderate MR on TEE in 01/2023 and 02/2023.   Hypertension History of hypertension. However, BP soft in the office today at 90/70.    CKD Stage IIIb Baseline creatinine prior to recent admission in 12/2022 was 1.3 to 1.4. However, creatinine has been around 1.8 to 2.0 since discharge.  - Creatinine 1.91 on 03/31/2023.   Obstructive Sleep Apnea - Continue CPAP.   Disposition: Follow up after hospitalization.    Leanne Lovely, PA-C  04/15/2023 4:47 PM    [facility]

## 2023-04-11 ENCOUNTER — Ambulatory Visit: Payer: Self-pay

## 2023-04-11 NOTE — Patient Outreach (Signed)
  Care Coordination   Follow Up Visit Note   04/11/2023 Name: Brandon [name] MRN: [id number] DOB: 10-19-1940  Brandon [name] is a 83 y.o. year old male who sees [name], NP for primary care. I spoke with  Brandon [name] by phone today.  What matters to the patients health and wellness today?  [name] reports he has noticed some SOB-called cardiology and scheduled a sooner appointment for 04/15/23. He states he has been taking his pulmonary medications as prescribed including nebulizer medication. He states it has not worsened since he call to get a sooner appointment. [name] states Eliquis cost [phone]. He reports his daughter paid for his last refill. He states cardiology is assisting with completing medication assistance forms for Eliquis.   Goals Addressed             This Visit's Progress    Assist with health management       Interventions Today    Flowsheet Row Most Recent Value  Chronic Disease   Chronic disease during today's visit Atrial Fibrillation (AFib), Hypertension (HTN), Congestive Heart Failure (CHF), Chronic Kidney Disease/End Stage Renal Disease (ESRD)  General Interventions   General Interventions Discussed/Reviewed General Interventions Reviewed, Doctor Visits  [Evaluation of current treatment plan for health condition and patient's adherence to plan. assess patient's blood pressure readings]  Doctor Visits Discussed/Reviewed Doctor Visits Reviewed, PCP, Specialist  PCP/Specialist Visits Compliance with follow-up visit  [reviewed upcoming scheduled appointments. including nephrology appointment-per patient is scheduled for March.]  Education Interventions   Education Provided Provided Education  Provided Verbal Education On When to see the doctor, Medication  [advised to take medications as prescribed, attend provider visits as scheduled, contact provider with health questions or concerns as needed.]  Pharmacy Interventions   Pharmacy  Dicussed/Reviewed Pharmacy Topics Reviewed  Advanced Directive Interventions   Advanced Directives Discussed/Reviewed Advanced Directives Discussed, Advanced Care Planning  [assessed advance care planning needs. patient states he has a medical POA, but has not completed medical advance directive. advance care packet sent via mail. discussed Social wok available if needed.]            SDOH assessments and interventions completed:  No  Care Coordination Interventions:  Yes, provided   Follow up plan: Follow up call scheduled for 05/16/23    Encounter Outcome:  Patient Visit Completed   [name], RN, MSN, BSN, CCM [place]  [facility], Population Health Case Manager Phone: [phone]

## 2023-04-11 NOTE — Patient Instructions (Signed)
Visit Information  Thank you for taking time to visit with me today. Please don't hesitate to contact me if I can be of assistance to you.   Following are the goals we discussed today:  Continue to take medications as prescribed. Continue to attend provider visits as scheduled Continue to eat healthy, lean meats, vegetables, fruits, avoid saturated and transfats Contact provider with health questions or concerns as needed  Our next appointment is by telephone on 05/16/23 at 1:45 pm  Please call the care guide team at [phone] if you need to cancel or reschedule your appointment.   If you are experiencing a Mental Health or Behavioral Health Crisis or need someone to talk to, please call the Suicide and Crisis Lifeline: 988 call the [place] National Suicide Prevention Lifeline: (873)219-2195 or TTY: (315) 516-2649 TTY 701 201 9606) to talk to a trained counselor  [name], RN, MSN, BSN, CCM [place]  [facility], Population Health Case Manager Phone: [phone]

## 2023-04-12 DIAGNOSIS — M1612 Unilateral primary osteoarthritis, left hip: Secondary | ICD-10-CM | POA: Diagnosis not present

## 2023-04-15 ENCOUNTER — Encounter: Payer: Self-pay | Admitting: Student

## 2023-04-15 ENCOUNTER — Emergency Department (HOSPITAL_COMMUNITY): Payer: Medicare Other

## 2023-04-15 ENCOUNTER — Inpatient Hospital Stay (HOSPITAL_COMMUNITY)
Admission: EM | Admit: 2023-04-15 | Discharge: 2023-04-28 | DRG: 291 | Disposition: A | Discharge status: Skilled Nursing Facility | Payer: Medicare Other | Attending: Cardiology | Admitting: Cardiology

## 2023-04-15 ENCOUNTER — Other Ambulatory Visit: Payer: Self-pay

## 2023-04-15 ENCOUNTER — Ambulatory Visit: Payer: Medicare Other | Attending: Student | Admitting: Student

## 2023-04-15 ENCOUNTER — Encounter (HOSPITAL_COMMUNITY): Payer: Self-pay

## 2023-04-15 VITALS — BP 90/70 | HR 119 | Ht 71.0 in | Wt 197.0 lb

## 2023-04-15 DIAGNOSIS — K761 Chronic passive congestion of liver: Secondary | ICD-10-CM | POA: Diagnosis not present

## 2023-04-15 DIAGNOSIS — E872 Acidosis, unspecified: Secondary | ICD-10-CM | POA: Diagnosis not present

## 2023-04-15 DIAGNOSIS — I5020 Unspecified systolic (congestive) heart failure: Secondary | ICD-10-CM | POA: Diagnosis not present

## 2023-04-15 DIAGNOSIS — Z66 Do not resuscitate: Secondary | ICD-10-CM | POA: Diagnosis not present

## 2023-04-15 DIAGNOSIS — R570 Cardiogenic shock: Secondary | ICD-10-CM

## 2023-04-15 DIAGNOSIS — D649 Anemia, unspecified: Secondary | ICD-10-CM | POA: Diagnosis not present

## 2023-04-15 DIAGNOSIS — R918 Other nonspecific abnormal finding of lung field: Secondary | ICD-10-CM | POA: Diagnosis not present

## 2023-04-15 DIAGNOSIS — I428 Other cardiomyopathies: Secondary | ICD-10-CM | POA: Diagnosis present

## 2023-04-15 DIAGNOSIS — Z7984 Long term (current) use of oral hypoglycemic drugs: Secondary | ICD-10-CM

## 2023-04-15 DIAGNOSIS — R0602 Shortness of breath: Secondary | ICD-10-CM | POA: Diagnosis not present

## 2023-04-15 DIAGNOSIS — R64 Cachexia: Secondary | ICD-10-CM | POA: Diagnosis not present

## 2023-04-15 DIAGNOSIS — J441 Chronic obstructive pulmonary disease with (acute) exacerbation: Secondary | ICD-10-CM | POA: Diagnosis not present

## 2023-04-15 DIAGNOSIS — I4891 Unspecified atrial fibrillation: Secondary | ICD-10-CM | POA: Diagnosis not present

## 2023-04-15 DIAGNOSIS — I340 Nonrheumatic mitral (valve) insufficiency: Secondary | ICD-10-CM | POA: Diagnosis present

## 2023-04-15 DIAGNOSIS — I2510 Atherosclerotic heart disease of native coronary artery without angina pectoris: Secondary | ICD-10-CM

## 2023-04-15 DIAGNOSIS — Z7901 Long term (current) use of anticoagulants: Secondary | ICD-10-CM

## 2023-04-15 DIAGNOSIS — N179 Acute kidney failure, unspecified: Secondary | ICD-10-CM | POA: Diagnosis present

## 2023-04-15 DIAGNOSIS — I509 Heart failure, unspecified: Secondary | ICD-10-CM | POA: Diagnosis not present

## 2023-04-15 DIAGNOSIS — N1832 Chronic kidney disease, stage 3b: Secondary | ICD-10-CM

## 2023-04-15 DIAGNOSIS — N189 Chronic kidney disease, unspecified: Secondary | ICD-10-CM | POA: Diagnosis not present

## 2023-04-15 DIAGNOSIS — R0600 Dyspnea, unspecified: Secondary | ICD-10-CM

## 2023-04-15 DIAGNOSIS — R531 Weakness: Secondary | ICD-10-CM | POA: Diagnosis not present

## 2023-04-15 DIAGNOSIS — I639 Cerebral infarction, unspecified: Secondary | ICD-10-CM

## 2023-04-15 DIAGNOSIS — I5023 Acute on chronic systolic (congestive) heart failure: Secondary | ICD-10-CM | POA: Diagnosis present

## 2023-04-15 DIAGNOSIS — J9601 Acute respiratory failure with hypoxia: Secondary | ICD-10-CM | POA: Diagnosis not present

## 2023-04-15 DIAGNOSIS — Z743 Need for continuous supervision: Secondary | ICD-10-CM | POA: Diagnosis not present

## 2023-04-15 DIAGNOSIS — I4811 Longstanding persistent atrial fibrillation: Secondary | ICD-10-CM | POA: Diagnosis not present

## 2023-04-15 DIAGNOSIS — Z91199 Patient's noncompliance with other medical treatment and regimen due to unspecified reason: Secondary | ICD-10-CM

## 2023-04-15 DIAGNOSIS — Z7951 Long term (current) use of inhaled steroids: Secondary | ICD-10-CM | POA: Diagnosis not present

## 2023-04-15 DIAGNOSIS — Z789 Other specified health status: Secondary | ICD-10-CM | POA: Diagnosis not present

## 2023-04-15 DIAGNOSIS — G4733 Obstructive sleep apnea (adult) (pediatric): Secondary | ICD-10-CM | POA: Diagnosis not present

## 2023-04-15 DIAGNOSIS — I4819 Other persistent atrial fibrillation: Secondary | ICD-10-CM | POA: Diagnosis present

## 2023-04-15 DIAGNOSIS — I2722 Pulmonary hypertension due to left heart disease: Secondary | ICD-10-CM | POA: Diagnosis not present

## 2023-04-15 DIAGNOSIS — T8111XA Postprocedural  cardiogenic shock, initial encounter: Secondary | ICD-10-CM | POA: Diagnosis not present

## 2023-04-15 DIAGNOSIS — I700 Atherosclerosis of aorta: Secondary | ICD-10-CM | POA: Diagnosis present

## 2023-04-15 DIAGNOSIS — J9811 Atelectasis: Secondary | ICD-10-CM | POA: Diagnosis not present

## 2023-04-15 DIAGNOSIS — I130 Hypertensive heart and chronic kidney disease with heart failure and stage 1 through stage 4 chronic kidney disease, or unspecified chronic kidney disease: Principal | ICD-10-CM | POA: Diagnosis present

## 2023-04-15 DIAGNOSIS — G9009 Other idiopathic peripheral autonomic neuropathy: Secondary | ICD-10-CM | POA: Diagnosis not present

## 2023-04-15 DIAGNOSIS — Z452 Encounter for adjustment and management of vascular access device: Secondary | ICD-10-CM | POA: Diagnosis not present

## 2023-04-15 DIAGNOSIS — N184 Chronic kidney disease, stage 4 (severe): Secondary | ICD-10-CM | POA: Diagnosis not present

## 2023-04-15 DIAGNOSIS — Z515 Encounter for palliative care: Secondary | ICD-10-CM | POA: Diagnosis not present

## 2023-04-15 DIAGNOSIS — Z8673 Personal history of transient ischemic attack (TIA), and cerebral infarction without residual deficits: Secondary | ICD-10-CM

## 2023-04-15 DIAGNOSIS — Z7401 Bed confinement status: Secondary | ICD-10-CM | POA: Diagnosis not present

## 2023-04-15 DIAGNOSIS — M069 Rheumatoid arthritis, unspecified: Secondary | ICD-10-CM | POA: Diagnosis not present

## 2023-04-15 DIAGNOSIS — J90 Pleural effusion, not elsewhere classified: Secondary | ICD-10-CM | POA: Diagnosis not present

## 2023-04-15 DIAGNOSIS — I110 Hypertensive heart disease with heart failure: Secondary | ICD-10-CM | POA: Diagnosis not present

## 2023-04-15 DIAGNOSIS — R54 Age-related physical debility: Secondary | ICD-10-CM | POA: Diagnosis present

## 2023-04-15 DIAGNOSIS — I10 Essential (primary) hypertension: Secondary | ICD-10-CM | POA: Diagnosis not present

## 2023-04-15 DIAGNOSIS — I2723 Pulmonary hypertension due to lung diseases and hypoxia: Secondary | ICD-10-CM | POA: Diagnosis not present

## 2023-04-15 DIAGNOSIS — Z7189 Other specified counseling: Secondary | ICD-10-CM | POA: Diagnosis not present

## 2023-04-15 DIAGNOSIS — E785 Hyperlipidemia, unspecified: Secondary | ICD-10-CM | POA: Diagnosis present

## 2023-04-15 DIAGNOSIS — I5084 End stage heart failure: Secondary | ICD-10-CM | POA: Diagnosis not present

## 2023-04-15 DIAGNOSIS — J4489 Other specified chronic obstructive pulmonary disease: Secondary | ICD-10-CM | POA: Diagnosis not present

## 2023-04-15 DIAGNOSIS — I517 Cardiomegaly: Secondary | ICD-10-CM | POA: Diagnosis not present

## 2023-04-15 DIAGNOSIS — E875 Hyperkalemia: Secondary | ICD-10-CM | POA: Diagnosis present

## 2023-04-15 DIAGNOSIS — Z87891 Personal history of nicotine dependence: Secondary | ICD-10-CM

## 2023-04-15 DIAGNOSIS — Z79899 Other long term (current) drug therapy: Secondary | ICD-10-CM

## 2023-04-15 DIAGNOSIS — I429 Cardiomyopathy, unspecified: Secondary | ICD-10-CM | POA: Diagnosis not present

## 2023-04-15 LAB — BASIC METABOLIC PANEL
Anion gap: 14 (ref 5–15)
BUN: 57 mg/dL — ABNORMAL HIGH (ref 8–23)
CO2: 18 mmol/L — ABNORMAL LOW (ref 22–32)
Calcium: 9.4 mg/dL (ref 8.9–10.3)
Chloride: 105 mmol/L (ref 98–111)
Creatinine, Ser: 2.71 mg/dL — ABNORMAL HIGH (ref 0.61–1.24)
GFR, Estimated: 23 mL/min — ABNORMAL LOW (ref >60)
Glucose, Bld: 102 mg/dL — ABNORMAL HIGH (ref 70–99)
Potassium: 5.1 mmol/L (ref 3.5–5.1)
Sodium: 137 mmol/L (ref 135–145)

## 2023-04-15 LAB — I-STAT CG4 LACTIC ACID, ED: Lactic Acid, Venous: 2.7 mmol/L (ref 0.5–1.9)

## 2023-04-15 LAB — BRAIN NATRIURETIC PEPTIDE: B Natriuretic Peptide: 2612.3 pg/mL — ABNORMAL HIGH (ref 0.0–100.0)

## 2023-04-15 LAB — CBC
HCT: 36.9 % — ABNORMAL LOW (ref 39.0–52.0)
Hemoglobin: 11.8 g/dL — ABNORMAL LOW (ref 13.0–17.0)
MCH: 31.4 pg (ref 26.0–34.0)
MCHC: 32 g/dL (ref 30.0–36.0)
MCV: 98.1 fL (ref 80.0–100.0)
Platelets: 130 10*3/uL — ABNORMAL LOW (ref 150–400)
RBC: 3.76 MIL/uL — ABNORMAL LOW (ref 4.22–5.81)
RDW: 16.1 % — ABNORMAL HIGH (ref 11.5–15.5)
WBC: 8.8 10*3/uL (ref 4.0–10.5)
nRBC: 0.2 % (ref 0.0–0.2)

## 2023-04-15 LAB — MAGNESIUM: Magnesium: 2.5 mg/dL — ABNORMAL HIGH (ref 1.7–2.4)

## 2023-04-15 LAB — TROPONIN I (HIGH SENSITIVITY)
Troponin I (High Sensitivity): 63 ng/L — ABNORMAL HIGH (ref <18)
Troponin I (High Sensitivity): 57 ng/L — ABNORMAL HIGH (ref <18)

## 2023-04-15 LAB — RESP PANEL BY RT-PCR (RSV, FLU A&B, COVID)  RVPGX2
Influenza A by PCR: NEGATIVE
Influenza B by PCR: NEGATIVE
Resp Syncytial Virus by PCR: NEGATIVE
SARS Coronavirus 2 by RT PCR: NEGATIVE

## 2023-04-15 MED ORDER — HEPARIN (PORCINE) 25000 UT/250ML-% IV SOLN
1300.0000 [IU]/h | INTRAVENOUS | Status: DC
  Administered 2023-04-16: 1300 [IU]/h via INTRAVENOUS
  Filled 2023-04-15: qty 250

## 2023-04-15 MED ORDER — AMIODARONE HCL IN DEXTROSE 360-4.14 MG/200ML-% IV SOLN
60.0000 mg/h | INTRAVENOUS | Status: DC
  Administered 2023-04-17: 30 mg/h via INTRAVENOUS
  Administered 2023-04-18: 60 mg/h via INTRAVENOUS
  Administered 2023-04-18: 30 mg/h via INTRAVENOUS
  Administered 2023-04-18 – 2023-04-22 (×13): 60 mg/h via INTRAVENOUS
  Filled 2023-04-15 (×20): qty 200

## 2023-04-15 MED ORDER — FUROSEMIDE 10 MG/ML IJ SOLN
80.0000 mg | Freq: Once | INTRAMUSCULAR | Status: AC
  Administered 2023-04-16: 80 mg via INTRAVENOUS
  Filled 2023-04-15: qty 8

## 2023-04-15 MED ORDER — APIXABAN 2.5 MG PO TABS: 2.5000 mg | ORAL_TABLET | Freq: Two times a day (BID) | ORAL | Status: DC

## 2023-04-15 MED ORDER — ACETAMINOPHEN 325 MG PO TABS
650.0000 mg | ORAL_TABLET | ORAL | Status: DC | PRN
Start: 2023-04-15 — End: 2023-04-23
  Administered 2023-04-16: 650 mg via ORAL
  Filled 2023-04-15: qty 2

## 2023-04-15 MED ORDER — ONDANSETRON HCL 4 MG/2ML IJ SOLN: 4.0000 mg | Freq: Four times a day (QID) | INTRAMUSCULAR | Status: DC | PRN

## 2023-04-15 MED ORDER — AMIODARONE HCL IN DEXTROSE 360-4.14 MG/200ML-% IV SOLN
60.0000 mg/h | INTRAVENOUS | Status: AC
  Administered 2023-04-15 – 2023-04-16 (×2): 60 mg/h via INTRAVENOUS
  Filled 2023-04-15 (×2): qty 200

## 2023-04-15 MED ORDER — AMIODARONE LOAD VIA INFUSION
150.0000 mg | Freq: Once | INTRAVENOUS | Status: AC
  Administered 2023-04-15: 150 mg via INTRAVENOUS
  Filled 2023-04-15: qty 83.34

## 2023-04-15 NOTE — ED Triage Notes (Signed)
Pt to ED from cards office . Reports hx a fib. Takes Eliquis, denies CP, Reports SHOB X 1 month. NAD in triage.

## 2023-04-15 NOTE — Progress Notes (Signed)
PHARMACY - ANTICOAGULATION CONSULT NOTE  Pharmacy Consult for heparin Indication: atrial fibrillation  Allergies  Allergen Reactions   Ace Inhibitors Cough   Isosorb Dinitrate-Hydralazine Other (See Comments)    dizziness/hypotensive    Patient Measurements: Height: 5\' 11"  (180.3 cm) Weight: 89.4 kg (197 lb) IBW/kg (Calculated) : 75.3 Heparin Dosing Weight: 89.4 kg  Vital Signs: Temp: 97.6 F (36.4 C) (01/31 2111) Temp Source: Oral (01/31 2111) BP: 101/67 (01/31 2300) Pulse Rate: 54 (01/31 2310)  Labs: Recent Labs    04/15/23 1627 04/15/23 1841  HGB 11.8*  --   HCT 36.9*  --   PLT 130*  --   CREATININE 2.71*  --   TROPONINIHS 63* 57*    Estimated Creatinine Clearance: 22.4 mL/min (A) (by C-G formula based on SCr of 2.71 mg/dL (H)).   Medications:   PTA eliquis 2.5mg  PO BID (last dose 1/31 @1000 )  Assessment: 36 yoM presented to ED with SOB. PMH includes persistent atrial fibrillation, nonischemic cardiomyopathy (EF 20-25%), hypertension, obstructive sleep apnea, pulmonary hypertension and cerebrovascular accident.  Pharmacy consulted to dose heparin for atrial fibrillation  Goal of Therapy:  Heparin level 0.3-0.7 units/ml aPTT 66-102 seconds Monitor platelets by anticoagulation protocol: Yes   Plan:  Start heparin 1300 units/hr, no bolus given recent eliquis administration  8h aPTT and heparin level  Monitor aPTT and heparin level daily until levels correlate, then switch to heparin levels CBC daily   Arabella Merles, PharmD. Clinical Pharmacist 04/15/2023 11:53 PM

## 2023-04-15 NOTE — ED Notes (Addendum)
Unable to access peripheral IV multiple times , IV team consult ordered .  

## 2023-04-15 NOTE — ED Provider Triage Note (Signed)
Emergency Medicine Provider Triage Evaluation Note  Brandon [name] , a 83 y.o. male  was evaluated in triage.  Pt complains of SOB.  Review of Systems  Positive:  Negative:   Physical Exam  BP 99/87   Pulse 60   Temp 98 F (36.7 C) (Oral)   Resp 18   Ht 5\' 11"  (1.803 m)   Wt 89.4 kg   SpO2 100%   BMI 27.48 kg/m  Gen:   Awake, no distress   Resp:  Normal effort  MSK:   Moves extremities without difficulty  Other:    Medical Decision Making  Medically screening exam initiated at 4:24 PM.  Appropriate orders placed.  Brandon [name] was informed that the remainder of the evaluation will be completed by another provider, this initial triage assessment does not replace that evaluation, and the importance of remaining in the ED until their evaluation is complete.  Hx afib. SOB that has been getting progressively worse over 1 month. Is compliant on blood thinners.   Denies fever, chest pain, cough, nausea, vomiting, diarrhea, dysuria, hematuria, hematochezia. Cardiology referred patient to ED.    Brandon [name], [place] 04/15/23 1626

## 2023-04-15 NOTE — H&P (Signed)
Cardiology Admission History and Physical   Patient ID: Legacy E [name] MRN: [id number]; DOB: 10-23-1940   Admission date: 04/15/2023  PCP:  [name], NP   [facility] Providers Cardiologist:  [name], MD        Chief Complaint:  shortness of breath  Patient Profile:   Brandon [name] is a 83 y.o. 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  History of Present Illness:   [name] states he was instructed to come to the emergency department at the request of his primary cardiologist due to concern for shortness of breath.  Brandon [name] states he has had progressively worsening shortness of breath for the past 4 to 6 weeks.  Patient states minimal exertion triggers this shortness of breath.  However he has also had some shortness of breath at rest.  He has had dyspnea on exertion, orthopnea and worsening leg swelling.  Denies chest pain syncope  Brandon [name] follows up with cardiology for management of nonischemic cardiomyopathy.  He has had an LV function of approximately 40 to 45% that started back in 2018.  For heart failure guideline directed medical therapy, he predominantly been on Entresto, metoprolol succinate, spironolactone. He is also on torsemide. 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 An 11 day cardiac telemetry monitor was placed on 05/18/2022 which showed 453 episodes of SVT, with the longest episode for 1hr 21 minutes. Brandon [name] also has obstructive sleep apnea, however, he has not used his CPAP machine in at least 3 months.  [name] was newly diagnosed with atrial fibrillation in October, 2024.  He started on Eliquis, and was directly admitted on 01/10/2023 for at [facility] for electrical cardioversion.   Transthoracic echocardiogram also found a worsening left ventricular function of 20 to 25% (previously 45 to 50% in 04/2022), severely dilated left atria, and moderate to severe mitral regurgitation.  Right heart catheterization demonstrated elevated right-sided filling pressures, PCWP of 14, cardiac index via thermodilution of 2, PVR of 3 Woods units, transesophageal echocardiogram discovered a thrombus in the left atrial appendage, thus, the cardioversion was deferred. TEE showed mild to moderate mitral regurgitation. He was discharged on metoprolol succinate 25 mg daily, and dosed reduced Eliquis at 2.5 mg twice daily (renal function and age).  His Entresto and spironolactone were discontinued due to soft blood pressures, and instead he was started on Jardiance prior to discharge.  Brandon [name] underwent a repeat trans esophageal echocardiogram on 02/28/2023, which did not show left atrial appendage thrombus.  Subsequently underwent synchronized electrical cardioversion with successful restoration to sinus rhythm.  Brandon [name] states he felt better initially after the cardioversion, however within 2 months, he developed worsening shortness of breath.  Thinks he went back into atrial fibrillation.  The ER, telemetry demonstrated atrial fibrillation with ventricular rates ranging between 110 and 150, blood pressure 90-99/70-87, SpO2 100% on room air.  Notable labs include proBNP of 2612, high-sensitivity troponin of 63->57, K 5.1, sCr 2.71, Mg 2.5. CXR showed bilateral pleural effusions and atelectasis.   Past Medical History:  Diagnosis Date   Anemia    Asthma    Hx of childhood asthma, disappeared for a while, then resurfaced 6-7 years ago.    Cardiomyopathy    with a negative cardiac catheterization in the past. (EF appriximately 40-45%)    Heme  positive stool 02/01/2019   HTN (hypertension)    x 30 years   Obesity, unspecified    Pneumonia    Sleep apnea    CPAP   Stroke (HCC) 04/16/2022    Unspecified disorder resulting from impaired renal function     Past Surgical History:  Procedure Laterality Date   CARDIOVERSION N/A 02/28/2023   Procedure: CARDIOVERSION;  Surgeon: Lewayne Bunting, MD;  Location: [facility] INVASIVE CV LAB;  Service: Cardiovascular;  Laterality: N/A;   None     RIGHT HEART CATH N/A 01/13/2023   Procedure: RIGHT HEART CATH;  Surgeon: Dorthula Nettles, DO;  Location: [facility] INVASIVE CV LAB;  Service: Cardiovascular;  Laterality: N/A;   TRANSESOPHAGEAL ECHOCARDIOGRAM (CATH LAB) N/A 01/14/2023   Procedure: TRANSESOPHAGEAL ECHOCARDIOGRAM;  Surgeon: Thurmon Fair, MD;  Location: [facility] INVASIVE CV LAB;  Service: Cardiovascular;  Laterality: N/A;   TRANSESOPHAGEAL ECHOCARDIOGRAM (CATH LAB) N/A 02/28/2023   Procedure: TRANSESOPHAGEAL ECHOCARDIOGRAM;  Surgeon: Lewayne Bunting, MD;  Location: [facility] INVASIVE CV LAB;  Service: Cardiovascular;  Laterality: N/A;     Medications Prior to Admission: Prior to Admission medications   Medication Sig Start Date End Date Taking? Authorizing Provider  albuterol (PROVENTIL) (2.5 MG/3ML) 0.083% nebulizer solution TAKE 3 MLS BY NEBULIZER EVERY 6 HOURS AS NEEDED FOR WHEEZING FOR SHORTNESS OF BREATH 02/23/22   [name], NP  albuterol (VENTOLIN HFA) 108 (90 Base) MCG/ACT inhaler Inhale 1-2 puffs into the lungs every 6 (six) hours as needed for wheezing or shortness of breath. 11/08/22   [name], NP  apixaban (ELIQUIS) 2.5 MG TABS tablet Take 1 tablet (2.5 mg total) by mouth 2 (two) times daily. 01/15/23   Marjie Skiff E, PA-C  cyanocobalamin (VITAMIN B12) 1000 MCG tablet Take 1 tablet (1,000 mcg total) by mouth daily. 09/21/22   [name], NP  empagliflozin (JARDIANCE) 10 MG TABS tablet Take 1 tablet (10 mg total) by mouth daily. 02/17/23   Marjie Skiff E, PA-C  fluticasone-salmeterol (ADVAIR) 500-50 MCG/ACT AEPB Inhale 1 puff into the lungs in the morning and at bedtime. 06/29/22   [name], NP   folic acid (FOLVITE) 1 MG tablet Take 1 tablet (1 mg total) by mouth daily. 12/28/22   [name], NP  gabapentin (NEURONTIN) 300 MG capsule Take 1 capsule (300 mg total) by mouth at bedtime. 12/20/22   [name], NP  leflunomide (ARAVA) 20 MG tablet Take 20 mg by mouth daily. 05/31/19   [provider]  metoprolol succinate (TOPROL-XL) 25 MG 24 hr tablet Take 1.5 tablets (37.5 mg total) by mouth daily. 03/04/23   [name], NP  montelukast (SINGULAIR) 10 MG tablet Take 1 tablet (10 mg total) by mouth at bedtime. 03/22/23   [name], NP  omeprazole (PRILOSEC) 20 MG capsule Take 1 capsule (20 mg total) by mouth daily. 11/04/22   [name], MD  rosuvastatin (CRESTOR) 10 MG tablet Take 1 tablet (10 mg total) by mouth daily. 11/10/22   Wanda Plump, MD  tamsulosin [facility]) 0.4 MG CAPS capsule Take 1 capsule by mouth once daily 03/21/23   [name], NP  torsemide (DEMADEX) 20 MG tablet Take 2 tablets (40 mg total) by mouth daily. May take an extra 20mg  daily for a 3 pound weight gain in 24 hours 02/01/23   Tonye Becket D, NP     Allergies:    Allergies  Allergen Reactions   Ace Inhibitors Cough   Isosorb Dinitrate-Hydralazine Other (See Comments)    dizziness/hypotensive  Social History:   Social History   Socioeconomic History   Marital status: Married    Spouse name: [name]   Number of children: 3   Years of education: Not on file   Highest education level: Bachelor's degree (e.g., BA, AB, BS)  Occupational History   Occupation: retired  Tobacco Use   Smoking status: Former   Smokeless tobacco: Never   Tobacco comments:    quit smoling in 05/03/88. started when 18, 1 ppd  Vaping Use   Vaping status: Never Used  Substance and Sexual Activity   Alcohol use: Not Currently    Comment: occ   Drug use: Never   Sexual activity: Not on file  Other Topics Concern   Not on file  Social History Narrative   Grew up in [place], attended  [employer]. First wife died of breast ca in 05-03-97. 3 children. Remarried- 8 years. 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    Pt signed designated party release granting access to [facility] to his wife [name]. Detailed message may be left on home or cell phone. Roselle Locus August 13, 2009 11:34 am.    Social Drivers of Health   Financial Resource Strain: Low Risk  (01/13/2023)   Overall Financial Resource Strain (CARDIA)    Difficulty of Paying Living Expenses: Not hard at all  Food Insecurity: No Food Insecurity (03/02/2023)   Hunger Vital Sign    Worried About Running Out of Food in the Last Year: Never true    Ran Out of Food in the Last Year: Never true  Transportation Needs: No Transportation Needs (03/02/2023)   PRAPARE - Administrator, Civil Service (Medical): No    Lack of Transportation (Non-Medical): No  Physical Activity: Insufficiently Active (07/25/2020)   Exercise Vital Sign    Days of Exercise per Week: 5 days    Minutes of Exercise per Session: 10 min  Stress: No Stress Concern Present (05/28/2021)   [employer] of Occupational Health - Occupational Stress Questionnaire    Feeling of Stress : Not at all  Social Connections: Socially Integrated (05/28/2021)   Social Connection and Isolation Panel [NHANES]    Frequency of Communication with Friends and Family: More than three times a week    Frequency of Social Gatherings with Friends and Family: Three times a week    Attends Religious Services: More than 4 times per year    Active Member of Clubs or Organizations: Yes    Attends Banker Meetings: More than 4 times per year    Marital Status: Married  Catering manager Violence: Not At Risk (03/02/2023)   Humiliation, Afraid, Rape, and Kick questionnaire    Fear of Current or Ex-Partner: No    Emotionally Abused: No    Physically Abused: No    Sexually Abused: No    Family History:   The patient's family history  includes Asthma in his daughter; Diabetes Mellitus II in his sister; Lupus in his sister; Multiple myeloma in his father; Neuropathy in his sister. There is no history of Colon cancer, Esophageal cancer, Stomach cancer, or Pancreatic cancer.    ROS:  Please see the history of present illness.  All other ROS reviewed and negative.     Physical Exam/Data:   Vitals:   04/15/23 1609 04/15/23 May 03, 2109 04/15/23 2200 04/15/23 2230  BP:  94/67 94/72 94/77   Pulse:  70    Resp:  16 19 (!) 26  Temp:  97.6 F (36.4 C)    TempSrc:  Oral    SpO2:  91%    Weight: 89.4 kg     Height: 5\' 11"  (1.803 m)      No intake or output data in the 24 hours ending 04/15/23 2243    04/15/2023    4:09 PM 04/15/2023    2:28 PM 02/28/2023   11:13 AM  Last 3 Weights  Weight (lbs) 197 lb 197 lb 195 lb  Weight (kg) 89.359 kg 89.359 kg 88.451 kg     Body mass index is 27.48 kg/m.  General:  elderly man in no distress HEENT: normal Neck:  JVD at least 3cm at 90 degrees Vascular: No carotid bruits; Distal pulses 2+ bilaterally   Cardiac:  irregularly irregular Lungs:  clear to auscultation bilaterally, no wheezing, rhonchi or rales  Abd: soft, nontender, no hepatomegaly  Ext: warm, 2+  edema of the lower extremities Musculoskeletal:  No deformities, BUE and BLE strength normal and equal Skin: warm and dry  Neuro:  CNs 2-12 intact, no focal abnormalities noted Psych:  Normal affect    EKG:  The ECG that was done on 04/15/23 was personally reviewed and demonstrates atrial fibrillation  Relevant CV Studies:  TEE 02/28/2023  IMPRESSIONS     1. Left ventricular ejection fraction, by estimation, is 25 to 30%. The  left ventricle has severely decreased function. The left ventricle  demonstrates global hypokinesis. The left ventricular internal cavity size  was moderately dilated.   2. Right ventricular systolic function is mildly reduced. The right  ventricular size is mildly enlarged.   3. Left atrial size  was severely dilated. No left atrial/left atrial  appendage thrombus was detected.   4. Right atrial size was mildly dilated.   5. The mitral valve is normal in structure. Mild to moderate mitral valve  regurgitation.   6. The aortic valve is tricuspid. Aortic valve regurgitation is trivial.   7. There is mild (Grade II) plaque involving the descending aorta.   FINDINGS   Left Ventricle: Left ventricular ejection fraction, by estimation, is 25  to 30%. The left ventricle has severely decreased function. The left  ventricle demonstrates global hypokinesis. The left ventricular internal  cavity size was moderately dilated.   Right Ventricle: The right ventricular size is mildly enlarged. Right  ventricular systolic function is mildly reduced.   Left Atrium: Left atrial size was severely dilated. No left atrial/left  atrial appendage thrombus was detected.   Right Atrium: Right atrial size was mildly dilated.   Pericardium: There is no evidence of pericardial effusion.   Mitral Valve: The mitral valve is normal in structure. Mild to moderate  mitral valve regurgitation.   Tricuspid Valve: The tricuspid valve is normal in structure. Tricuspid  valve regurgitation is mild.   Aortic Valve: The aortic valve is tricuspid. Aortic valve regurgitation is  trivial. Aortic valve mean gradient measures 2.0 mmHg. Aortic valve peak  gradient measures 4.3 mmHg.   Pulmonic Valve: The pulmonic valve was normal in structure. Pulmonic valve  regurgitation is trivial.   Aorta: The aortic root is normal in size and structure. There is mild  (Grade II) plaque involving the descending aorta.   IAS/Shunts: No atrial level shunt detected by color flow Doppler.   Additional Comments: Spectral Doppler performed.   AORTIC VALVE  AV Vmax:      104.00 cm/s  AV Vmean:     72.000 cm/s  AV VTI:  0.146 m  AV Peak Grad: 4.3 mmHg  AV Mean Grad: 2.0 mmHg    AORTA  Ao Root diam: 3.70 cm    TRICUSPID VALVE  TR Peak grad:   32.3 mmHg  TR Vmax:        284.00 cm/s   Olga Millers MD  Electronically signed by Olga Millers MD  Signature Date/Time: 02/28/2023/2:40:32 PM   RHC 01/13/2023  HEMODYNAMICS: RA:                  5 mmHg (mean) RV:                  40/3-3-5 mmHg PA:                  39/24 mmHg (30 mean) PCWP:            14 mmHg (mean)                                      Estimated Fick CO/CI   5.2 L/min, 2.4 L/min/m2 Thermodilution CO/CI   4.3 L/min, 2 L/min/m2                                                 TPG                 16  mmHg                                            PVR                 3-3.7 Wood Units  PAPi                3       IMPRESSION: Normal pre and post capillary filling pressures.  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  Mild to moderately reduced cardiac output / index; reduced by thermodilution.    Aditya Sabharwal Advanced Heart Failure 11:10 AM    Cardiac event monitor 05/18/2022  Preliminary Findings Prepared by Wilfred Lacy, CCT 03/ 05/ 24 Patient had a min HR of 42 bpm, max HR of 214 bpm, and avg HR of 68 bpm. Predominant underlying rhythm was Sinus Rhythm. Bundle Branch Block/ IVCD was present. 8 Ventricular Tachycardia runs occurred, the run with the fastest interval lasting 15 beats with a max rate of 203 bpm ( avg 189 bpm) ; the run with the fastest interval was also the longest. 453 Supraventricular Tachycardia runs occurred, the run with the fastest interval lasting 6 beats with a max rate of 214 bpm, the longest lasting 1 hour 21 mins with an avg rate of 135 bpm. Supraventricular Tachycardia was detected within + / - 45 seconds of symptomatic patient event( s) . Isolated SVEs were frequent ( 9. 2% , 102227) , SVE Couplets were rare ( < 1. 0% , 4393) , and SVE Triplets were rare ( < 1. 0% , 732) . Isolated VEs were occasional ( 3. 2% , 35717) , VE Couplets were rare  ( < 1. 0% , 3445) , and VE Triplets  were rare ( < 1. 0% , 168) . Ventricular Bigeminy and Trigeminy were present.  Laboratory Data:  High Sensitivity Troponin:   Recent Labs  Lab 04/15/23 1627 04/15/23 1841  TROPONINIHS 63* 57*      Chemistry Recent Labs  Lab 04/15/23 1627  NA 137  K 5.1  CL 105  CO2 18*  GLUCOSE 102*  BUN 57*  CREATININE 2.71*  CALCIUM 9.4  MG 2.5*  GFRNONAA 23*  ANIONGAP 14    No results for input(s): "PROT", "ALBUMIN", "AST", "ALT", "ALKPHOS", "BILITOT" in the last 168 hours. Lipids No results for input(s): "CHOL", "TRIG", "HDL", "LABVLDL", "LDLCALC", "CHOLHDL" in the last 168 hours. Hematology Recent Labs  Lab 04/15/23 1627  WBC 8.8  RBC 3.76*  HGB 11.8*  HCT 36.9*  MCV 98.1  MCH 31.4  MCHC 32.0  RDW 16.1*  PLT 130*   Thyroid No results for input(s): "TSH", "FREET4" in the last 168 hours. BNP Recent Labs  Lab 04/15/23 1627  BNP 2,612.3*    DDimer No results for input(s): "DDIMER" in the last 168 hours.   Radiology/Studies:  DG Chest 2 View Result Date: 04/15/2023 CLINICAL DATA:  Shortness of breath EXAM: CHEST - 2 VIEW COMPARISON:  01/11/2023 FINDINGS: Lateral view degraded by patient arm position. Midline trachea. Moderate cardiomegaly. Atherosclerosis in the transverse aorta. Suspect tiny bilateral pleural effusions, blunting the costophrenic angles. No congestive failure. Mild subsegmental atelectasis at the lung bases. IMPRESSION: No significant change from 01/11/2023 Cardiomegaly with probable tiny bilateral pleural effusions and bibasilar subsegmental atelectasis. Aortic Atherosclerosis (ICD10-I70.0). Electronically Signed   By: Jeronimo Greaves M.D.   On: 04/15/2023 17:22     Assessment and Plan:   Brandon [name] is a 83 y.o. 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  #persistent atrial fibrillation Ostin presents today in rapid atrial fibrillation with shortness of breath and volume overload.  He was found to have dynamic troponins, indicative of acute myocardial injury.  Taiquan has had a mildly reduced left ventricular function since 2018, however since had a decreased LV function that started around the time of his diagnosis of atrial fibrillation.  He has a severely enlarged left atrium and has had a prior stroke in February 2024.  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  His risk factors for atrial fibrillation include age, hypertension, untreated sleep apnea, pulmonary hypertension, CHF and severely enlarged left atrium.  Rhythm control will difficult to sustain given the above.  We will pursue IV amiodarone load with electrical cardioversion once he is euvolemic. -5g IV amiodarone load -spot diuresis -goal K>4, Mg>2  #Non-ischemic cardiomyopathy #Congestive heart failure #acute myocardial injury #group II pulmonary hypertension Brennden has several etiologies for acute myocardial injury which include atrial fibrillation and pulmonary hypertension.  He has not been thoroughly evaluated for acute coronary syndrome the past due to his kidney function.  Unfortunately, he has an acute kidney injury today.  Now, we will treat for atrial fibrillation, and start him on IV heparin.  He is severely volume overloaded with acute kidney injury and an elevated lactate to 2.7, concerning for lower organ perfusion. Will have him monitored in the cardiac ICU, if emergent cardioversion or  pressor support is warranted. -spot diurese IV lasix -hold BB -hold jardiance   #Acute kidney injury #Chronic kidney disease sCr 2.7 on admission. Likely 2/2 cardiorenal  syndrome -diurese as above -place foley catheter  #Hypertension -hold blood pressure agents  #Cerebrovascular accident -on anticoagulation as above -continue rosuvastatin  Risk Assessment/Risk Scores:       New York Heart Association (NYHA) Functional Class NYHA Class III  CHA2DS2-VASc Score =     This indicates a  % annual risk of stroke. The patient's score is based upon:       Code Status: Do Not Resuscitate (DNR)  Severity of Illness: The appropriate patient status for this patient is INPATIENT. Inpatient status is judged to be reasonable and necessary in order to provide the required intensity of service to ensure the patient's safety. The patient's presenting symptoms, physical exam findings, and initial radiographic and laboratory data in the context of their chronic comorbidities is felt to place them at high risk for further clinical deterioration. Furthermore, it is not anticipated that the patient will be medically stable for discharge from the hospital within 2 midnights of admission.   * I certify that at the point of admission it is my clinical judgment that the patient will require inpatient hospital care spanning beyond 2 midnights from the point of admission due to high intensity of service, high risk for further deterioration and high frequency of surveillance required.*   For questions or updates, please contact [facility] Please consult [email or web address] for contact info under     Signed, Donavan Burnet, MD  04/15/2023 10:43 PM

## 2023-04-15 NOTE — ED Provider Notes (Signed)
[place] EMERGENCY DEPARTMENT AT [facility] Provider Note   CSN: [id number] Arrival date & time: 04/15/23  1555     History  Chief Complaint  Patient presents with   Atrial Fibrillation    Brandon [name] is a 83 y.o. male.   Atrial Fibrillation  Patient presents with shortness of breath.  Sent in by cardiology for A-fib with RVR with plans of amiodarone drip and potentially cardioversion.  Has been feeling bad for a week.  Reportedly weight doing well.  Slight chest tightness.  Claims compliance with his Eliquis.     Home Medications Prior to Admission medications   Medication Sig Start Date End Date Taking? Authorizing Provider  albuterol (PROVENTIL) (2.5 MG/3ML) 0.083% nebulizer solution TAKE 3 MLS BY NEBULIZER EVERY 6 HOURS AS NEEDED FOR WHEEZING FOR SHORTNESS OF BREATH 02/23/22   [name], NP  albuterol (VENTOLIN HFA) 108 (90 Base) MCG/ACT inhaler Inhale 1-2 puffs into the lungs every 6 (six) hours as needed for wheezing or shortness of breath. 11/08/22   [name], NP  apixaban (ELIQUIS) 2.5 MG TABS tablet Take 1 tablet (2.5 mg total) by mouth 2 (two) times daily. 01/15/23   Marjie Skiff E, PA-C  cyanocobalamin (VITAMIN B12) 1000 MCG tablet Take 1 tablet (1,000 mcg total) by mouth daily. 09/21/22   [name], NP  empagliflozin (JARDIANCE) 10 MG TABS tablet Take 1 tablet (10 mg total) by mouth daily. 02/17/23   Marjie Skiff E, PA-C  fluticasone-salmeterol (ADVAIR) 500-50 MCG/ACT AEPB Inhale 1 puff into the lungs in the morning and at bedtime. 06/29/22   [name], NP  folic acid (FOLVITE) 1 MG tablet Take 1 tablet (1 mg total) by mouth daily. 12/28/22   [name], NP  gabapentin (NEURONTIN) 300 MG capsule Take 1 capsule (300 mg total) by mouth at bedtime. 12/20/22   [name], NP  leflunomide (ARAVA) 20 MG tablet Take 20 mg by mouth daily. 05/31/19   [provider]  metoprolol succinate  (TOPROL-XL) 25 MG 24 hr tablet Take 1.5 tablets (37.5 mg total) by mouth daily. 03/04/23   [name], NP  montelukast (SINGULAIR) 10 MG tablet Take 1 tablet (10 mg total) by mouth at bedtime. 03/22/23   [name], NP  omeprazole (PRILOSEC) 20 MG capsule Take 1 capsule (20 mg total) by mouth daily. 11/04/22   [name], MD  rosuvastatin (CRESTOR) 10 MG tablet Take 1 tablet (10 mg total) by mouth daily. 11/10/22   Wanda Plump, MD  tamsulosin [facility]) 0.4 MG CAPS capsule Take 1 capsule by mouth once daily 03/21/23   [name], NP  torsemide (DEMADEX) 20 MG tablet Take 2 tablets (40 mg total) by mouth daily. May take an extra 20mg  daily for a 3 pound weight gain in 24 hours 02/01/23   Tonye Becket D, NP      Allergies    Ace inhibitors and Isosorb dinitrate-hydralazine    Review of Systems   Review of Systems  Physical Exam Updated Vital Signs BP 94/72   Pulse 70   Temp 97.6 F (36.4 C) (Oral)   Resp 19   Ht 5\' 11"  (1.803 m)   Wt 89.4 kg   SpO2 91%   BMI 27.48 kg/m  Physical Exam Vitals reviewed.  Cardiovascular:     Rate and Rhythm: Tachycardia present. Rhythm irregular.  Abdominal:     Tenderness: There is no abdominal tenderness.  Musculoskeletal:     Right lower leg: Edema present.  Left lower leg: Edema present.  Skin:    Capillary Refill: Capillary refill takes less than 2 seconds.  Neurological:     Mental Status: He is alert and oriented to person, place, and time.     ED Results / Procedures / Treatments   Labs (all labs ordered are listed, but only abnormal results are displayed) Labs Reviewed  BASIC METABOLIC PANEL - Abnormal; Notable for the following components:      Result Value   CO2 18 (*)    Glucose, Bld 102 (*)    BUN 57 (*)    Creatinine, Ser 2.71 (*)    GFR, Estimated 23 (*)    All other components within normal limits  MAGNESIUM - Abnormal; Notable for the following components:   Magnesium 2.5 (*)    All other  components within normal limits  BRAIN NATRIURETIC PEPTIDE - Abnormal; Notable for the following components:   B Natriuretic Peptide 2,612.3 (*)    All other components within normal limits  CBC - Abnormal; Notable for the following components:   RBC 3.76 (*)    Hemoglobin 11.8 (*)    HCT 36.9 (*)    RDW 16.1 (*)    Platelets 130 (*)    All other components within normal limits  TROPONIN I (HIGH SENSITIVITY) - Abnormal; Notable for the following components:   Troponin I (High Sensitivity) 63 (*)    All other components within normal limits  TROPONIN I (HIGH SENSITIVITY) - Abnormal; Notable for the following components:   Troponin I (High Sensitivity) 57 (*)    All other components within normal limits  RESP PANEL BY RT-PCR (RSV, FLU A&B, COVID)  [id number]    EKG EKG Interpretation Date/Time:  Friday April 15 2023 16:16:52 EST Ventricular Rate:  128 PR Interval:    QRS Duration:  136 QT Interval:  368 QTC Calculation: 537 R Axis:   83  Text Interpretation: Atrial fibrillation with rapid ventricular response with premature ventricular or aberrantly conducted complexes Indeterminate axis Right bundle branch block Abnormal ECG When compared with ECG of 15-Apr-2023 14:34, No significant change since last tracing Confirmed by Benjiman Core [id number]) on 04/15/2023 9:42:05 PM  Radiology DG Chest 2 View Result Date: 04/15/2023 CLINICAL DATA:  Shortness of breath EXAM: CHEST - 2 VIEW COMPARISON:  01/11/2023 FINDINGS: Lateral view degraded by patient arm position. Midline trachea. Moderate cardiomegaly. Atherosclerosis in the transverse aorta. Suspect tiny bilateral pleural effusions, blunting the costophrenic angles. No congestive failure. Mild subsegmental atelectasis at the lung bases. IMPRESSION: No significant change from 01/11/2023 Cardiomegaly with probable tiny bilateral pleural effusions and bibasilar subsegmental atelectasis. Aortic Atherosclerosis (ICD10-I70.0). Electronically  Signed   By: Jeronimo Greaves M.D.   On: 04/15/2023 17:22    Procedures Procedures    Medications Ordered in ED Medications  amiodarone (NEXTERONE) 1.8 mg/mL load via infusion 150 mg (has no administration in time range)    Followed by  amiodarone (NEXTERONE PREMIX) 360-4.14 MG/200ML-% (1.8 mg/mL) IV infusion (has no administration in time range)    Followed by  amiodarone (NEXTERONE PREMIX) 360-4.14 MG/200ML-% (1.8 mg/mL) IV infusion (has no administration in time range)    ED Course/ Medical Decision Making/ A&P                                 Medical Decision Making Risk Prescription drug management. Decision regarding hospitalization.   Patient with A-fib with RVR.  History of  same.  Reviewed cardiology note.  Differential diagnosis includes CHF and arrhythmia.  Continues to be in A-fib with RVR.  Cardiology notes suggest amiodarone drip and potentially cardioversion if this does not work.  Will discuss with cardiology.  Troponin mildly elevated.  BNP is elevated.  Weight similar to prior.  Chest x-ray does not show large amount of fluid.  Discussed with Dr. Derrell Lolling, who will likely admit patient.  Order for IV amiodarone placed.        Final Clinical Impression(s) / ED Diagnoses Final diagnoses:  Atrial fibrillation with RVR [facility])    Rx / DC Orders ED Discharge Orders     None         Benjiman Core, MD 04/15/23 2225

## 2023-04-15 NOTE — Patient Instructions (Signed)
Medication Instructions:  The current medical regimen is effective;  continue present plan and medications as directed. Please refer to the Current Medication list given to you today.  *If you need a refill on your cardiac medications before your next appointment, please call your pharmacy*  Lab Work: NONE  Other Instructions GO DIRECTLY TO THE ER  Follow-Up: At [facility], you and your health needs are our priority.  As part of our continuing mission to provide you with exceptional heart care, we have created designated Provider Care Teams.  These Care Teams include your primary Cardiologist (physician) and Advanced Practice Providers (APPs -  Physician Assistants and Nurse Practitioners) who all work together to provide you with the care you need, when you need it.  Your next appointment:   WILL ARRANGE IN HOSP   Provider:   [name], MD

## 2023-04-16 ENCOUNTER — Other Ambulatory Visit: Payer: Self-pay

## 2023-04-16 ENCOUNTER — Inpatient Hospital Stay (HOSPITAL_COMMUNITY): Payer: Medicare Other

## 2023-04-16 DIAGNOSIS — R570 Cardiogenic shock: Secondary | ICD-10-CM

## 2023-04-16 DIAGNOSIS — G4733 Obstructive sleep apnea (adult) (pediatric): Secondary | ICD-10-CM | POA: Diagnosis not present

## 2023-04-16 DIAGNOSIS — I4891 Unspecified atrial fibrillation: Secondary | ICD-10-CM | POA: Diagnosis not present

## 2023-04-16 DIAGNOSIS — I4819 Other persistent atrial fibrillation: Secondary | ICD-10-CM

## 2023-04-16 DIAGNOSIS — N179 Acute kidney failure, unspecified: Secondary | ICD-10-CM

## 2023-04-16 DIAGNOSIS — T8111XA Postprocedural  cardiogenic shock, initial encounter: Secondary | ICD-10-CM | POA: Diagnosis not present

## 2023-04-16 HISTORY — DX: Cardiogenic shock: R57.0

## 2023-04-16 LAB — CG4 I-STAT (LACTIC ACID): Lactic Acid, Venous: 2.8 mmol/L (ref 0.5–1.9)

## 2023-04-16 LAB — BASIC METABOLIC PANEL
Anion gap: 16 — ABNORMAL HIGH (ref 5–15)
Anion gap: 16 — ABNORMAL HIGH (ref 5–15)
Anion gap: 12 (ref 5–15)
BUN: 62 mg/dL — ABNORMAL HIGH (ref 8–23)
BUN: 69 mg/dL — ABNORMAL HIGH (ref 8–23)
BUN: 70 mg/dL — ABNORMAL HIGH (ref 8–23)
CO2: 17 mmol/L — ABNORMAL LOW (ref 22–32)
CO2: 16 mmol/L — ABNORMAL LOW (ref 22–32)
CO2: 19 mmol/L — ABNORMAL LOW (ref 22–32)
Calcium: 9.7 mg/dL (ref 8.9–10.3)
Calcium: 9.2 mg/dL (ref 8.9–10.3)
Calcium: 8.9 mg/dL (ref 8.9–10.3)
Chloride: 104 mmol/L (ref 98–111)
Chloride: 102 mmol/L (ref 98–111)
Chloride: 101 mmol/L (ref 98–111)
Creatinine, Ser: 2.88 mg/dL — ABNORMAL HIGH (ref 0.61–1.24)
Creatinine, Ser: 3.34 mg/dL — ABNORMAL HIGH (ref 0.61–1.24)
Creatinine, Ser: 3.38 mg/dL — ABNORMAL HIGH (ref 0.61–1.24)
GFR, Estimated: 21 mL/min — ABNORMAL LOW (ref >60)
GFR, Estimated: 18 mL/min — ABNORMAL LOW (ref >60)
GFR, Estimated: 17 mL/min — ABNORMAL LOW (ref >60)
Glucose, Bld: 168 mg/dL — ABNORMAL HIGH (ref 70–99)
Glucose, Bld: 179 mg/dL — ABNORMAL HIGH (ref 70–99)
Glucose, Bld: 191 mg/dL — ABNORMAL HIGH (ref 70–99)
Potassium: 5.9 mmol/L — ABNORMAL HIGH (ref 3.5–5.1)
Potassium: 5.4 mmol/L — ABNORMAL HIGH (ref 3.5–5.1)
Potassium: 4.9 mmol/L (ref 3.5–5.1)
Sodium: 137 mmol/L (ref 135–145)
Sodium: 134 mmol/L — ABNORMAL LOW (ref 135–145)
Sodium: 132 mmol/L — ABNORMAL LOW (ref 135–145)

## 2023-04-16 LAB — HEPATIC FUNCTION PANEL
ALT: 623 U/L — ABNORMAL HIGH (ref 0–44)
AST: 273 U/L — ABNORMAL HIGH (ref 15–41)
Albumin: 3.4 g/dL — ABNORMAL LOW (ref 3.5–5.0)
Alkaline Phosphatase: 137 U/L — ABNORMAL HIGH (ref 38–126)
Bilirubin, Direct: 0.5 mg/dL — ABNORMAL HIGH (ref 0.0–0.2)
Indirect Bilirubin: 1.1 mg/dL — ABNORMAL HIGH (ref 0.3–0.9)
Total Bilirubin: 1.6 mg/dL — ABNORMAL HIGH (ref 0.0–1.2)
Total Protein: 6.4 g/dL — ABNORMAL LOW (ref 6.5–8.1)

## 2023-04-16 LAB — CBC
HCT: 37.1 % — ABNORMAL LOW (ref 39.0–52.0)
Hemoglobin: 11.8 g/dL — ABNORMAL LOW (ref 13.0–17.0)
MCH: 31.6 pg (ref 26.0–34.0)
MCHC: 31.8 g/dL (ref 30.0–36.0)
MCV: 99.5 fL (ref 80.0–100.0)
Platelets: 122 10*3/uL — ABNORMAL LOW (ref 150–400)
RBC: 3.73 MIL/uL — ABNORMAL LOW (ref 4.22–5.81)
RDW: 16.3 % — ABNORMAL HIGH (ref 11.5–15.5)
WBC: 8.6 10*3/uL (ref 4.0–10.5)
nRBC: 0.2 % (ref 0.0–0.2)

## 2023-04-16 LAB — HEPARIN LEVEL (UNFRACTIONATED): Heparin Unfractionated: 1.05 [IU]/mL — ABNORMAL HIGH (ref 0.30–0.70)

## 2023-04-16 LAB — LIPID PANEL
Cholesterol: 112 mg/dL (ref 0–200)
HDL: 38 mg/dL — ABNORMAL LOW (ref >40)
LDL Cholesterol: 61 mg/dL (ref 0–99)
Total CHOL/HDL Ratio: 2.9 {ratio}
Triglycerides: 66 mg/dL (ref <150)
VLDL: 13 mg/dL (ref 0–40)

## 2023-04-16 LAB — URINALYSIS, ROUTINE W REFLEX MICROSCOPIC
Bacteria, UA: NONE SEEN
Bilirubin Urine: NEGATIVE
Glucose, UA: >=500 mg/dL — AB
Hgb urine dipstick: NEGATIVE
Ketones, ur: NEGATIVE mg/dL
Leukocytes,Ua: NEGATIVE
Nitrite: NEGATIVE
Protein, ur: 30 mg/dL — AB
Specific Gravity, Urine: 1.016 (ref 1.005–1.030)
pH: 5 (ref 5.0–8.0)

## 2023-04-16 LAB — I-STAT CG4 LACTIC ACID, ED: Lactic Acid, Venous: 4.2 mmol/L (ref 0.5–1.9)

## 2023-04-16 LAB — PROTIME-INR
INR: 1.8 — ABNORMAL HIGH (ref 0.8–1.2)
Prothrombin Time: 21.1 s — ABNORMAL HIGH (ref 11.4–15.2)

## 2023-04-16 LAB — COOXEMETRY PANEL
Carboxyhemoglobin: 0.7 % (ref 0.5–1.5)
Methemoglobin: <0.7 % (ref 0.0–1.5)
O2 Saturation: 51.1 %
Total hemoglobin: 10.8 g/dL — ABNORMAL LOW (ref 12.0–16.0)

## 2023-04-16 LAB — APTT: aPTT: 51 s — ABNORMAL HIGH (ref 24–36)

## 2023-04-16 MED ORDER — NOREPINEPHRINE 4 MG/250ML-% IV SOLN
2.0000 ug/min | INTRAVENOUS | Status: DC
  Filled 2023-04-16: qty 250

## 2023-04-16 MED ORDER — SODIUM ZIRCONIUM CYCLOSILICATE 10 G PO PACK
10.0000 g | PACK | Freq: Once | ORAL | Status: AC
  Administered 2023-04-16: 10 g via ORAL
  Filled 2023-04-16: qty 1

## 2023-04-16 MED ORDER — GABAPENTIN 300 MG PO CAPS
300.0000 mg | ORAL_CAPSULE | Freq: Every day | ORAL | Status: DC
  Administered 2023-04-16 – 2023-04-27 (×12): 300 mg via ORAL
  Filled 2023-04-16 (×12): qty 1

## 2023-04-16 MED ORDER — SODIUM CHLORIDE 0.9 % IV SOLN
250.0000 mL | INTRAVENOUS | Status: DC
  Administered 2023-04-16 (×2): 250 mL via INTRAVENOUS

## 2023-04-16 MED ORDER — MOMETASONE FURO-FORMOTEROL FUM 200-5 MCG/ACT IN AERO
2.0000 | INHALATION_SPRAY | Freq: Two times a day (BID) | RESPIRATORY_TRACT | Status: DC
  Administered 2023-04-16 – 2023-04-28 (×24): 2 via RESPIRATORY_TRACT
  Filled 2023-04-16: qty 8.8

## 2023-04-16 MED ORDER — SODIUM CHLORIDE 0.9% FLUSH
10.0000 mL | Freq: Two times a day (BID) | INTRAVENOUS | Status: DC
  Administered 2023-04-16 – 2023-04-17 (×2): 10 mL
  Administered 2023-04-17: 40 mL
  Administered 2023-04-18: 10 mL
  Administered 2023-04-18: 40 mL
  Administered 2023-04-19 – 2023-04-25 (×13): 10 mL
  Administered 2023-04-25: 20 mL
  Administered 2023-04-26 – 2023-04-28 (×5): 10 mL

## 2023-04-16 MED ORDER — METOLAZONE 5 MG PO TABS
5.0000 mg | ORAL_TABLET | Freq: Once | ORAL | Status: AC
  Administered 2023-04-16: 5 mg via ORAL
  Filled 2023-04-16: qty 1

## 2023-04-16 MED ORDER — SODIUM CHLORIDE 0.9% FLUSH: 10.0000 mL | INTRAVENOUS | Status: DC | PRN

## 2023-04-16 MED ORDER — SODIUM CHLORIDE 0.9 % IV SOLN: INTRAVENOUS | Status: AC | PRN

## 2023-04-16 MED ORDER — CHLORHEXIDINE GLUCONATE CLOTH 2 % EX PADS
6.0000 | MEDICATED_PAD | Freq: Every day | CUTANEOUS | Status: DC
  Administered 2023-04-16 – 2023-04-24 (×9): 6 via TOPICAL

## 2023-04-16 MED ORDER — MILRINONE LACTATE IN DEXTROSE 20-5 MG/100ML-% IV SOLN
0.1250 ug/kg/min | INTRAVENOUS | Status: AC
  Administered 2023-04-16 – 2023-04-18 (×2): 0.125 ug/kg/min via INTRAVENOUS
  Filled 2023-04-16 (×3): qty 100

## 2023-04-16 MED ORDER — MONTELUKAST SODIUM 10 MG PO TABS
10.0000 mg | ORAL_TABLET | Freq: Every day | ORAL | Status: DC
  Administered 2023-04-16 – 2023-04-27 (×12): 10 mg via ORAL
  Filled 2023-04-16 (×12): qty 1

## 2023-04-16 MED ORDER — ROSUVASTATIN CALCIUM 5 MG PO TABS
10.0000 mg | ORAL_TABLET | Freq: Every day | ORAL | Status: DC
Start: 2023-04-16 — End: 2023-04-17
  Administered 2023-04-16 – 2023-04-17 (×2): 10 mg via ORAL
  Filled 2023-04-16: qty 2

## 2023-04-16 MED ORDER — APIXABAN 2.5 MG PO TABS
2.5000 mg | ORAL_TABLET | Freq: Two times a day (BID) | ORAL | Status: DC
  Administered 2023-04-16 – 2023-04-23 (×15): 2.5 mg via ORAL
  Filled 2023-04-16 (×15): qty 1

## 2023-04-16 MED ORDER — NOREPINEPHRINE 4 MG/250ML-% IV SOLN
INTRAVENOUS | Status: AC
  Administered 2023-04-16: 2 ug/min via INTRAVENOUS
  Filled 2023-04-16: qty 250

## 2023-04-16 MED ORDER — DEXTROSE 5 % IV SOLN
20.0000 mg/h | INTRAVENOUS | Status: DC
  Administered 2023-04-16 – 2023-04-18 (×5): 20 mg/h via INTRAVENOUS
  Filled 2023-04-16 (×8): qty 20

## 2023-04-16 NOTE — Significant Event (Addendum)
Update regarding [name]'s plan of care.  Brandon [name] has worsening shortness of breath, a rising lactate (2.7->4.2), transaminitis, and oliguria, concerning for low flow cardiogenic shock.  Has fluctuating systolic blood pressures ranging from 90-115. I have added a Levophed drip. He remains on an amiodarone drip, which has improved his ventricular rates (now in the 90s to  low 100s), however he still has evidence of organ malperfusion.    Given his advanced age, CODE STATUS of DNAR and comorbid conditions, I opted to have a goals of care conversation with the patient and his family.  I initially had a 30-minute conversation with his daughter Brandon [name] (was present earlier during admission) as well as a friend, Brandon [name], who is in the medical field.  Brandon [name] instructed me to reach out to both of these people.  Brandon [name] has the ability to make medical decisions at this time.  I discussed my concern for cardiogenic shock, and wanted to discuss possible treatment plans.  I discussed Brandon [name]'s poor urination despite IV Lasix. I discussed the potential need for continuous renal replacement therapy, intubation, or mechanical circulatory support if needed.  I discussed the risks and benefits of these procedures.  I also discussed the possibility that pursuing the procedures above would not guarantee a positive outcome.  I also discussed the need for an arterial line, as well as a central line.  Brandon [name] had concerns about invasive procedures for her father.  Brandon [name] was amenable to an arterial line, and possibly continuous renal replacement therapy if needed.   I also discussed the possibility of cardioversion.  At this time, Brandon [name] is rate controlled.  I do not think electrical cardioversion would be the best approach right now, as he likely wouldn't remain in sinus rhythm given his volume overload, severe LAE and comorbid conditions. After  much discussion, the family opted to continue medical therapy for now with IV Lasix.   They were amenable to an arterial line only at this point.  I will reconvene with Brandon [name] once we have further data.  Start him on a Lasix drip.  I reached out to the pulmonary critical care team, who will evaluate the patient and place an arterial line.

## 2023-04-16 NOTE — Progress Notes (Addendum)
NAME:  Brandon [name], MRN:  [id number], DOB:  1940/07/21, LOS: 1 ADMISSION DATE:  04/15/2023, CONSULTATION DATE:  04/16/2023 REFERRING MD:  Dr Derrell Lolling, CHIEF COMPLAINT:  heart failure   History of Present Illness:  Consulted, to assist with Patient being managed for decompensated heart failure, cardiogenic shock, Atrial fibrillation -On pressors, on amiodarone -Declined aggressive intervention but agreeable to have an A-line placed.  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    Cardiomyopathy    with a negative cardiac catheterization in the past. 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    Significant Hospital Events: Including procedures, antibiotic start and stop dates in addition to other pertinent events   04/15/2023 -admitted Recent right heart cath 01/13/2023 with PVR of 3-3.7 Woods units, PA pressure 39/24 with a mean of 30 Chest x-ray 04/15/2023-cardiomegaly, mild pleural effusions  Interim History / Subjective:  Has some right hand/finger numbness, no chest pain, breathing comfortably currently  Objective   Blood pressure 102/73, pulse (!) 104, temperature 97.8 F (36.6 C), temperature source Oral,  resp. rate (!) 24, height 5\' 11"  (1.803 m), weight 89.4 kg, SpO2 100%.        Intake/Output Summary (Last 24 hours) at 04/16/2023 1026 Last data filed at 04/16/2023 4098 Gross per 24 hour  Intake --  Output 15 ml  Net -15 ml   Filed Weights   04/15/23 1609  Weight: 89.4 kg    Examination:  General: 83 year old male patient laying in bed no acute distress HEENT normocephalic atraumatic no jugular venous distention appreciated Pulmonary diminished bases currently no accessory use Cardiac regular irregular atrial fib on telemetry Extremities: Cool but capillary refill still less than 3 seconds, no significant edema. Ulnar pulse doppler +  GU minimal urine output Neuro intact  Resolved Hospital Problem list     Assessment & Plan:  Decompensated congestive heart failure Cardiogenic shock with progressive lactic acidosis -Being managed by the heart failure team, felt exacerbated by persistent atrial fibrillation Plan Continue telemetry monitoring DNR Additional management per heart failure team Holding his [employer] beta-blockade NE for MAP > 65 Continue Lasix drip with close observation of labs He just got a PICC line, continue to monitor CVP.  Co. oximetry 51% deferring inotropic support to heart failure  Atrial fibrillation  Patient had declined cardioversion Plan Continue telemetry monitoring Continue IV amiodarone Keep K greater than 4 and mag greater than 2 IV heparin in place of DOAC  Mild pulmonary hypertension Group 2 pulmonary hypertension Plan Optimize cardiac output as able  Acute kidney injury, with progressive anion gap metabolic acidosis and hyperkalemia progressive Likely cardiorenal, no current emergent need for dialysis as potassium has trended down.  Although  if acidosis continues to worsen this will change Plan Ensure mean arterial pressure greater than 65 Renal dose medications Strict intake output Ensure adequate systolic blood  pressure Optimize cardiac output Will give one-time dose of Lokelma Follow-up afternoon chemistry  Transaminitis -Likely related to hepatic congestion Plan Follow-up LFTs in the morning  Obstructive sleep apnea -Noncompliant with CPAP Plan Continue pulse oximetry  History of CVA Plan Continuing his Crestor Eventually resume DOAC    Best Practice (right click and "Reselect all SmartList Selections" daily)   Diet/type: Regular consistency (see orders) DVT prophylaxis systemic heparin Pressure ulcer(s): N/A GI prophylaxis: N/A Lines: N/A Foley:  Yes, and it is still needed Code Status:  DNR Last date of multidisciplinary goals of care discussion [pending]  My time 32 min

## 2023-04-16 NOTE — Progress Notes (Signed)
eLink Physician-Brief Progress Note Patient Name: Brandon [name] DOB: Aug 05, 1940 MRN: [id number]   Date of Service  04/16/2023  HPI/Events of Note  Complicated patient with multiple co-morbidities including advanced heart failure, pulmonary hypertension, cardiogenic shock and kidney disease being treated for heart failure by the heart failure cardiology service, PCCM has been consulted to assist with management.  eICU Interventions  New Patient Evaluation.        Brandon [name] 04/16/2023, 6:42 AM

## 2023-04-16 NOTE — ED Notes (Addendum)
Secretary paged admitting MD for RN to update on abnormal lab test result and patient status .

## 2023-04-16 NOTE — Progress Notes (Signed)
Received pt from ED. Connected to patient to monitor and briefly assessed. Lasix gtt reordered from pharmacy. ED to send cartridge for Istat. Pt not in distress. VSS. Report given to oncoming RN.

## 2023-04-16 NOTE — ED Notes (Signed)
RT notified by secretary for patient's arterial line ordered by ICU MD .

## 2023-04-16 NOTE — ED Notes (Signed)
Elimination needs met via bedside commode.

## 2023-04-16 NOTE — Progress Notes (Signed)
Pt was complaining of right hand/thumb pain and numbness. Checked bilateral ulnar pulses, both were auscultated on doppler and were equal.  Christain Sacramento, RN

## 2023-04-16 NOTE — Plan of Care (Signed)

## 2023-04-16 NOTE — ED Notes (Signed)
Admitting MD explained plan of care to patient and family .

## 2023-04-16 NOTE — Progress Notes (Signed)
Peripherally Inserted Central Catheter Placement  The IV Nurse has discussed with the patient and/or persons authorized to consent for the patient, the purpose of this procedure and the potential benefits and risks involved with this procedure.  The benefits include less needle sticks, lab draws from the catheter, and the patient may be discharged home with the catheter. Risks include, but not limited to, infection, bleeding, blood clot (thrombus formation), and puncture of an artery; nerve damage and irregular heartbeat and possibility to perform a PICC exchange if needed/ordered by physician.  Alternatives to this procedure were also discussed.  Bard Power PICC patient education guide, fact sheet on infection prevention and patient information card has been provided to patient /or left at bedside.    PICC Placement Documentation  PICC Triple Lumen 04/16/23 Right Brachial 41 cm 0 cm (Active)  Indication for Insertion or Continuance of Line Chronic illness with exacerbations (CF, Sickle Cell, etc.) 04/16/23 1026  Exposed Catheter (cm) 0 cm 04/16/23 1026  Site Assessment Clean, Dry, Intact 04/16/23 1026  Lumen #1 Status Saline locked;Blood return noted 04/16/23 1026  Lumen #2 Status Saline locked;Blood return noted 04/16/23 1026  Lumen #3 Status Saline locked;Blood return noted 04/16/23 1026  Dressing Type Transparent;Securing device 04/16/23 1026  Dressing Status Antimicrobial disc/dressing in place;Clean, Dry, Intact 04/16/23 1026  Line Care Connections checked and tightened 04/16/23 1026  Line Adjustment (NICU/IV Team Only) No 04/16/23 1026  Dressing Intervention New dressing;Adhesive placed at insertion site (IV team only) 04/16/23 1026  Dressing Change Due 04/23/23 04/16/23 1026       Elliot Dally 04/16/2023, 10:42 AM

## 2023-04-16 NOTE — Consult Note (Signed)
NAME:  Brandon [name], MRN:  [id number], DOB:  1940-05-26, LOS: 1 ADMISSION DATE:  04/15/2023, CONSULTATION DATE:  04/16/2023 REFERRING MD:  Dr Derrell Lolling, CHIEF COMPLAINT:  heart failure   History of Present Illness:  Consulted, to assist with Patient being managed for decompensated heart failure, cardiogenic shock, Atrial fibrillation -On pressors, on amiodarone -Declined aggressive intervention but agreeable to have an A-line placed.  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    Cardiomyopathy    with a negative cardiac catheterization in the past. 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    Significant Hospital Events: Including procedures, antibiotic start and stop dates in addition to other pertinent events   04/15/2023 -admitted Recent right heart cath 01/13/2023 with PVR of 3-3.7 Woods units, PA pressure 39/24 with a mean of 30 Chest x-ray 04/15/2023-cardiomegaly, mild pleural effusions  Interim History / Subjective:  Elderly, shortness of breath,  Objective   Blood pressure 93/76, pulse 65, temperature 97.6 F (36.4 C), temperature source Oral, resp. rate 17, height 5\' 11"  (1.803 m), weight 89.4 kg,  SpO2 100%.       No intake or output data in the 24 hours ending 04/16/23 0309 Filed Weights   04/15/23 1609  Weight: 89.4 kg    Examination: General: Elderly, does not appear to be in distress HENT: Moist oral mucosa Lungs: Decreased air movement at the bases bilaterally Cardiovascular: S1-S2 appreciated Abdomen: Soft, bowel sounds appreciated Extremities: Skin is warm and dry, lower extremity edema Neuro: Alert and oriented to person place GU:   I reviewed nursing notes, Consultant notes, last 24 h vitals and pain scores, last 48 h intake and output, last 24 h labs and trends, and last 24 h imaging results.  Resolved Hospital Problem list     Assessment & Plan:  Decompensated congestive heart failure Cardiogenic shock -Being managed by the heart failure team  Mild pulmonary hypertension Group 2 pulmonary hypertension -Goal is to optimize heart failure management -On diuretics  Atrial fibrillation -was on Eliquis, heparin being started -On amiodarone -Will likely need cardioversion  Acute kidney injury Likely cardiorenal get -Maintain renal perfusion -Avoid nephrotoxic medications  Transaminitis -Likely related to hepatic congestion  Obstructive sleep apnea -Noncompliant with CPAP  History of cerebrovascular accident  Will benefit from central venous access -not agreeable at present  Best Practice (right click and "Reselect all SmartList Selections" daily)   Diet/type: Regular consistency (see orders) DVT prophylaxis systemic heparin Pressure ulcer(s): N/A GI prophylaxis: N/A Lines: N/A Foley:  Yes, and it is still needed Code Status:  DNR Last date of multidisciplinary goals of care discussion [pending]  Labs   CBC: Recent Labs  Lab 04/15/23 1627 04/16/23 0113  WBC 8.8 8.6  HGB 11.8* 11.8*  HCT 36.9* 37.1*  MCV 98.1 99.5  PLT 130* 122*    Basic Metabolic Panel: Recent Labs  Lab 04/15/23 1627 04/16/23 0125  NA 137 137  K 5.1 5.9*   CL 105 104  CO2 18* 17*  GLUCOSE 102* 168*  BUN 57* 62*  CREATININE 2.71* 2.88*  CALCIUM 9.4 9.7  MG 2.5*  --    GFR: Estimated Creatinine Clearance: 21.1 mL/min (A) (by C-G formula based on SCr of 2.88 mg/dL (H)). Recent Labs  Lab 04/15/23 1627 04/15/23 2303 04/16/23 0113 04/16/23 0119  WBC 8.8  --  8.6  --   LATICACIDVEN  --  2.7*  --  4.2*    Liver Function Tests: Recent Labs  Lab 04/16/23 0057  AST 273*  ALT 623*  ALKPHOS 137*  BILITOT 1.6*  PROT 6.4*  ALBUMIN 3.4*   No results for input(s): "LIPASE", "AMYLASE" in the last 168 hours. No results for input(s): "AMMONIA" in the last 168 hours.  ABG    Component Value Date/Time   PHART 7.403 01/13/2023 1053   PHART 7.397 01/13/2023 1053   PCO2ART 44.6 01/13/2023 1053   PCO2ART 45.6 01/13/2023 1053   PO2ART 30 (LL) 01/13/2023 1053   PO2ART 31 (LL) 01/13/2023 1053   HCO3 27.8 01/13/2023 1053   HCO3 28.0 01/13/2023 1053   TCO2 29 01/13/2023 1053   TCO2 29 01/13/2023 1053   O2SAT 57 01/13/2023 1053   O2SAT 58 01/13/2023 1053     Coagulation Profile: Recent Labs  Lab 04/16/23 0113  INR 1.8*    Cardiac Enzymes: No results for input(s): "CKTOTAL", "CKMB", "CKMBINDEX", "TROPONINI" in the last 168 hours.  HbA1C: Hgb A1c MFr Bld  Date/Time Value Ref Range Status  04/18/2022 12:54 PM 5.1 4.8 - 5.6 % Final    Comment:    (NOTE) Pre diabetes:          5.7%-6.4%  Diabetes:              >6.4%  Glycemic control for   <7.0% adults with diabetes   11/03/2021 02:58 PM 5.5 4.6 - 6.5 % Final    Comment:    Glycemic Control Guidelines for People with Diabetes:Non Diabetic:  <6%Goal of Therapy: <7%Additional Action Suggested:  >8%     CBG: No results for input(s): "GLUCAP" in the last 168 hours.  Review of Systems:   SOB  Past Medical History:  He,  has a past medical history of Anemia, Asthma, Cardiomyopathy, Heme positive stool (02/01/2019), HTN (hypertension), Obesity, unspecified, Pneumonia, Sleep  apnea, Stroke (HCC) (04/16/2022), and 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   Surgical History:   Past Surgical History:  Procedure Laterality Date   CARDIOVERSION N/A 02/28/2023   Procedure: CARDIOVERSION;  Surgeon: Lewayne Bunting, MD;  Location[facility] INVASIVE CV LAB;  Service: Cardiovascular;  Laterality: N/A;   None     RIGHT HEART CATH N/A 01/13/2023   Procedure: RIGHT HEART CATH;  Surgeon: Dorthula Nettles, DO;  Location: [facility] INVASIVE CV LAB;  Service: Cardiovascular;  Laterality: N/A;   TRANSESOPHAGEAL ECHOCARDIOGRAM (CATH LAB) N/A 01/14/2023   Procedure: TRANSESOPHAGEAL ECHOCARDIOGRAM;  Surgeon: Thurmon Fair, MD;  Location: [facility] INVASIVE CV LAB;  Service: Cardiovascular;  Laterality: N/A;   TRANSESOPHAGEAL ECHOCARDIOGRAM (CATH LAB) N/A 02/28/2023   Procedure: TRANSESOPHAGEAL ECHOCARDIOGRAM;  Surgeon: Lewayne Bunting, MD;  Location: [facility] INVASIVE CV LAB;  Service: Cardiovascular;  Laterality: N/A;     Social History:   reports  that he has quit smoking. He has never used smokeless tobacco. He reports that he does not currently use alcohol. He reports that he does not use drugs.   Family History:  His family history includes Asthma in his daughter; Diabetes Mellitus II in his sister; Lupus in his sister; Multiple myeloma in his father; Neuropathy in his sister. There is no history of Colon cancer, Esophageal cancer, Stomach cancer, or Pancreatic cancer.   Allergies Allergies  Allergen Reactions   Ace Inhibitors Cough   Isosorb Dinitrate-Hydralazine Other (See Comments)    dizziness/hypotensive    The patient is critically ill with multiple organ systems failure and requires high complexity decision making for assessment and support, frequent evaluation and titration of therapies, application of advanced monitoring technologies and extensive interpretation of multiple databases. Critical Care Time devoted to patient care services described in this note  independent of APP/resident time (if applicable)  is 30 minutes.   Virl Diamond MD [facility] Personal pager: See Amion If unanswered, please page CCM On-call: #[phone]

## 2023-04-16 NOTE — Progress Notes (Signed)
Advanced Heart Failure Rounding Note  Cardiologist: [name], MD  Chief Complaint:  Subjective:    Still feeling poorly this AM, short of breath, foley is irritating him. Starting on lasix gtt, milrinone started this AM as well.    Objective:   Weight Range: 89.4 kg Body mass index is 27.48 kg/m.   Vital Signs:   Temp:  [97.6 F (36.4 C)-98 F (36.7 C)] 97.8 F (36.6 C) (02/01 0756) Pulse Rate:  [54-120] 104 (02/01 0900) Resp:  [10-30] 24 (02/01 0900) BP: (91-118)/(60-92) 102/73 (02/01 0502) SpO2:  [82 %-100 %] 100 % (02/01 0900) Arterial Line BP: (93-124)/(67-83) 116/76 (02/01 0900) Weight:  [89.4 kg] 89.4 kg (01/31 1609)    Weight change: Filed Weights   04/15/23 1609  Weight: 89.4 kg    Intake/Output:   Intake/Output Summary (Last 24 hours) at 04/16/2023 1040 Last data filed at 04/16/2023 0973 Gross per 24 hour  Intake --  Output 15 ml  Net -15 ml      Physical Exam    General:  frail, chronically ill appearing Cor: Irregular rate and rhythm, systolic murmur, moderately elevated JVP, 1+ LE edema, extremities cool Lungs: Diminished breath sounds bilatearlly Abdomen: Soft, nontender, nondistended.  Telemetry   Afib in the 90s   Labs    CBC Recent Labs    04/15/23 1627 04/16/23 0113  WBC 8.8 8.6  HGB 11.8* 11.8*  HCT 36.9* 37.1*  MCV 98.1 99.5  PLT 130* 122*   Basic Metabolic Panel Recent Labs    53/29/92 1627 04/16/23 0125  NA 137 137  K 5.1 5.9*  CL 105 104  CO2 18* 17*  GLUCOSE 102* 168*  BUN 57* 62*  CREATININE 2.71* 2.88*  CALCIUM 9.4 9.7  MG 2.5*  --    Liver Function Tests Recent Labs    04/16/23 0057  AST 273*  ALT 623*  ALKPHOS 137*  BILITOT 1.6*  PROT 6.4*  ALBUMIN 3.4*   No results for input(s): "LIPASE", "AMYLASE" in the last 72 hours. Cardiac Enzymes No results for input(s): "CKTOTAL", "CKMB", "CKMBINDEX", "TROPONINI" in the last 72 hours.  BNP: BNP (last 3 results) Recent Labs     04/15/23 1627  BNP 2,612.3*    ProBNP (last 3 results) No results for input(s): "PROBNP" in the last 8760 hours.   D-Dimer No results for input(s): "DDIMER" in the last 72 hours. Hemoglobin A1C No results for input(s): "HGBA1C" in the last 72 hours. Fasting Lipid Panel Recent Labs    04/16/23 0057  CHOL 112  HDL 38*  LDLCALC 61  TRIG 66  CHOLHDL 2.9   Thyroid Function Tests No results for input(s): "TSH", "T4TOTAL", "T3FREE", "THYROIDAB" in the last 72 hours.  Invalid input(s): "FREET3"  Other results:   Medications:     Scheduled Medications:  gabapentin  300 mg Oral QHS   mometasone-formoterol  2 puff Inhalation BID   montelukast  10 mg Oral QHS   rosuvastatin  10 mg Oral Daily    Infusions:  sodium chloride 250 mL (04/16/23 0628)   sodium chloride     amiodarone 30 mg/hr (04/16/23 0608)   furosemide (LASIX) 200 mg in dextrose 5 % 100 mL (2 mg/mL) infusion 20 mg/hr (04/16/23 0852)   heparin 1,300 Units/hr (04/16/23 0121)   milrinone     norepinephrine (LEVOPHED) Adult infusion 2 mcg/min (04/16/23 0155)    PRN Medications: Place/Maintain arterial line **AND** sodium chloride, acetaminophen, ondansetron (ZOFRAN) IV    Patient Profile  Brandon [name] is a 83 y.o. 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  Assessment/Plan    Cardiogenic shock: Presented with elevated lactate, hepatic congestion, borderline BP in the setting of recurrent atrial fibrillation. Not and advanced therapies candidate given age. Will start on IV milrinone and continue aggressive diuresis. ACC/AHA Stage D end stage cardiomyopathy. - IV milrinone 0.125, increase later today as tolerated - PICC line and coox - IV lasix 20mg /hour - Holding home jardiance  currently - CVP monitoring - Metolazone 5mg  daily  Atrial fibrillation: Unclear if primary cause or secondary to his decompensation. Rates not elevated so will optimize medically prior to cardioversion. Only has missed one dose of OAC, this past Sunday. - Avoid nodal blockade, once lactate clearing can start IV amiodarone  AKI on CKD: Secondary to cardiorenal syndrome and shock. - Diuresis and care as above  Hyperkalemia: 5.9 in the setting of AKI, repeat ordered, if persistently elevated will start lokelma.   COPD:  - Home inhalers  HLD: - Home statin  Medication concerns reviewed with patient and pharmacy team. Barriers identified: AKI, hyperkalemia  Length of Stay: 1  Romie Minus, MD  04/16/2023, 10:40 AM  Advanced Heart Failure Team Pager [phone] (M-F; 7a - 5p)  Please contact [facility] for night-coverage after hours (5p -7a ) and weekends on [email or web address]  CRITICAL CARE Performed by: Romie Minus   Total critical care time: 40 minutes  Critical care time was exclusive of separately billable procedures and treating other patients.  Critical care was necessary to treat or prevent imminent or life-threatening deterioration.  Critical care was time spent personally by me on the following activities: development of treatment plan with patient and/or surrogate as well as nursing, discussions with consultants, evaluation of patient's response to treatment, examination of patient, obtaining history from patient or surrogate, ordering and performing treatments and interventions, ordering and review of laboratory studies, ordering and review of radiographic studies, pulse oximetry and re-evaluation of patient's condition.

## 2023-04-16 NOTE — Progress Notes (Signed)
Arterial Catheter Insertion Procedure Note  Caelin E [place]  [id number]  1941/01/12  Date:04/16/23  Time:3:47 AM    Provider Performing: Nelda Marseille    Procedure: Insertion of Arterial Line (40981) without US guidance  Indication(s) Blood pressure monitoring and/or need for frequent ABGs  Consent Risks of the procedure as well as the alternatives and risks of each were explained to the patient and/or caregiver.  Consent for the procedure was obtained and is signed in the bedside chart  Anesthesia None   Time Out Verified patient identification, verified procedure, site/side was marked, verified correct patient position, special equipment/implants available, medications/allergies/relevant history reviewed, required imaging and test results available.   Sterile Technique Maximal sterile technique including full sterile barrier drape, hand hygiene, sterile gown, sterile gloves, mask, hair covering, sterile ultrasound probe cover (if used).   Procedure Description Area of catheter insertion was cleaned with chlorhexidine and draped in sterile fashion. Without real-time ultrasound guidance an arterial catheter was placed into the right radial artery.  Appropriate arterial tracings confirmed on monitor.     Complications/Tolerance None; patient tolerated the procedure well.   EBL Minimal   Specimen(s) None

## 2023-04-17 DIAGNOSIS — R570 Cardiogenic shock: Secondary | ICD-10-CM | POA: Diagnosis not present

## 2023-04-17 DIAGNOSIS — I4891 Unspecified atrial fibrillation: Secondary | ICD-10-CM

## 2023-04-17 DIAGNOSIS — N179 Acute kidney failure, unspecified: Secondary | ICD-10-CM | POA: Diagnosis not present

## 2023-04-17 LAB — BASIC METABOLIC PANEL
Anion gap: 11 (ref 5–15)
Anion gap: 15 (ref 5–15)
BUN: 70 mg/dL — ABNORMAL HIGH (ref 8–23)
BUN: 78 mg/dL — ABNORMAL HIGH (ref 8–23)
CO2: 20 mmol/L — ABNORMAL LOW (ref 22–32)
CO2: 22 mmol/L (ref 22–32)
Calcium: 9 mg/dL (ref 8.9–10.3)
Calcium: 9.3 mg/dL (ref 8.9–10.3)
Chloride: 100 mmol/L (ref 98–111)
Chloride: 95 mmol/L — ABNORMAL LOW (ref 98–111)
Creatinine, Ser: 3.5 mg/dL — ABNORMAL HIGH (ref 0.61–1.24)
Creatinine, Ser: 3.42 mg/dL — ABNORMAL HIGH (ref 0.61–1.24)
GFR, Estimated: 17 mL/min — ABNORMAL LOW (ref >60)
GFR, Estimated: 17 mL/min — ABNORMAL LOW (ref >60)
Glucose, Bld: 116 mg/dL — ABNORMAL HIGH (ref 70–99)
Glucose, Bld: 116 mg/dL — ABNORMAL HIGH (ref 70–99)
Potassium: 4.4 mmol/L (ref 3.5–5.1)
Potassium: 4.4 mmol/L (ref 3.5–5.1)
Sodium: 131 mmol/L — ABNORMAL LOW (ref 135–145)
Sodium: 132 mmol/L — ABNORMAL LOW (ref 135–145)

## 2023-04-17 LAB — POCT I-STAT 7, (LYTES, BLD GAS, ICA,H+H)
Acid-base deficit: 6 mmol/L — ABNORMAL HIGH (ref 0.0–2.0)
Bicarbonate: 18.8 mmol/L — ABNORMAL LOW (ref 20.0–28.0)
Calcium, Ion: 1.13 mmol/L — ABNORMAL LOW (ref 1.15–1.40)
HCT: 33 % — ABNORMAL LOW (ref 39.0–52.0)
Hemoglobin: 11.2 g/dL — ABNORMAL LOW (ref 13.0–17.0)
O2 Saturation: 99 %
Patient temperature: 97
Potassium: 4.8 mmol/L (ref 3.5–5.1)
Sodium: 131 mmol/L — ABNORMAL LOW (ref 135–145)
TCO2: 20 mmol/L — ABNORMAL LOW (ref 22–32)
pCO2 arterial: 31.9 mm[Hg] — ABNORMAL LOW (ref 32–48)
pH, Arterial: 7.375 (ref 7.35–7.45)
pO2, Arterial: 145 mm[Hg] — ABNORMAL HIGH (ref 83–108)

## 2023-04-17 LAB — COOXEMETRY PANEL
Carboxyhemoglobin: 0.5 % (ref 0.5–1.5)
Carboxyhemoglobin: 0.3 % — ABNORMAL LOW (ref 0.5–1.5)
Methemoglobin: <0.7 % (ref 0.0–1.5)
Methemoglobin: <0.7 % (ref 0.0–1.5)
O2 Saturation: 98.6 %
O2 Saturation: 85.4 %
Total hemoglobin: 11.2 g/dL — ABNORMAL LOW (ref 12.0–16.0)
Total hemoglobin: 8.6 g/dL — ABNORMAL LOW (ref 12.0–16.0)

## 2023-04-17 LAB — MAGNESIUM: Magnesium: 2.4 mg/dL (ref 1.7–2.4)

## 2023-04-17 LAB — MRSA NEXT GEN BY PCR, NASAL: MRSA by PCR Next Gen: NOT DETECTED

## 2023-04-17 MED ORDER — ENSURE ENLIVE PO LIQD
237.0000 mL | Freq: Two times a day (BID) | ORAL | Status: DC
  Administered 2023-04-18 – 2023-04-23 (×7): 237 mL via ORAL

## 2023-04-17 MED ORDER — METOLAZONE 5 MG PO TABS
5.0000 mg | ORAL_TABLET | Freq: Once | ORAL | Status: AC
  Administered 2023-04-17: 5 mg via ORAL
  Filled 2023-04-17: qty 1

## 2023-04-17 MED ORDER — MAGNESIUM SULFATE 2 GM/50ML IV SOLN
2.0000 g | Freq: Once | INTRAVENOUS | Status: AC
  Administered 2023-04-17: 2 g via INTRAVENOUS
  Filled 2023-04-17: qty 50

## 2023-04-17 NOTE — Progress Notes (Signed)
NAME:  Brandon [name], MRN:  [id number], DOB:  1940-05-08, LOS: 2 ADMISSION DATE:  04/15/2023, CONSULTATION DATE:  04/16/2023 REFERRING MD:  Dr Derrell Lolling, CHIEF COMPLAINT:  heart failure   History of Present Illness:  Patient being managed for decompensated heart failure, cardiogenic shock, Atrial fibrillation -On pressors, on amiodarone -Declined aggressive intervention but agreeable to have an A-line placed.  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    Cardiomyopathy    with a negative cardiac catheterization in the past. 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    Significant Hospital Events: Including procedures, antibiotic start and stop dates in addition to other pertinent events   04/15/2023 -admitted Recent right heart cath 01/13/2023 with PVR of 3-3.7 Woods units, PA pressure 39/24 with a mean of 30 Chest x-ray 04/15/2023-cardiomegaly, mild pleural effusions  Interim History / Subjective:  Patient stated he is feeling better, made 2 L of urine overnight, remain net -1 L, weight is down about 1.5 pound Came off of Levophed Remain on milrinone, amiodarone and Lasix infusion  Objective   Blood pressure  102/73, pulse (!) 110, temperature (!) 97.5 F (36.4 C), temperature source Oral, resp. rate 20, height 5\' 11"  (1.803 m), weight 88.8 kg, SpO2 98%. CVP:  [12 mmHg] 12 mmHg      Intake/Output Summary (Last 24 hours) at 04/17/2023 1227 Last data filed at 04/17/2023 0800 Gross per 24 hour  Intake 971.4 ml  Output 3055 ml  Net -2083.6 ml   Filed Weights   04/15/23 1609 04/17/23 0500  Weight: 89.4 kg 88.8 kg    Examination: General: Acute on chronically ill-appearing elderly male, lying on the bed HEENT: /AT, eyes anicteric.  moist mucus membranes.  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  Persistent atrial fibrillation Pulmonary hypertension Advanced heart failure team is following Continue milrinone and Lasix infusion Patient made 3 L of urine overnight, remain net -1 L, weight is down 1.5 pound Monitor intake and output Off Levophed GDMT is limited to for now considering cardiogenic shock Continue amiodarone infusion Patient declined cardioversion Holding beta-blocker and Jardiance Closely monitor electrolytes and replace  Acute kidney injury due to cardiorenal syndrome Lactic acidosis Hyperkalemia, resolved Serum creatinine trended up to 3.5 He started making urine now Lactate has trended down to 2.8 Hyperkalemia has corrected Received Lokelma yesterday  Shock liver Monitor LFTs Avoid hepatotoxic agent  Obstructive sleep apnea Acute respiratory failure with  hypoxia due to pulmonary edema Noncompliant with CPAP Continue nasal cannula oxygen, titrate with O2 sat goal 92%  Prior CVA Continuing his Crestor Eventually resume DOAC    Best Practice (right  click and "Reselect all SmartList Selections" daily)   Per primary team   The patient is critically ill due to acute on chronic HFrEF with cardiogenic shock.  Critical care was necessary to treat or prevent imminent or life-threatening deterioration.  Critical care was time spent personally by me on the following activities: development of treatment plan with patient and/or surrogate as well as nursing, discussions with consultants, evaluation of patient's response to treatment, examination of patient, obtaining history from patient or surrogate, ordering and performing treatments and interventions, ordering and review of laboratory studies, ordering and review of radiographic studies, pulse oximetry, re-evaluation of patient's condition and participation in multidisciplinary rounds.   During this encounter critical care time was devoted to patient care services described in this note for 31 minutes.     Cheri Fowler, MD [facility] See Amion for pager If no response to pager, please call [phone] until 7pm After 7pm, Please call E-link [phone]

## 2023-04-17 NOTE — Plan of Care (Addendum)
The patient remains on [facility] at time of writing. Patient remains on continuous infusions of Milrinone, Lasix, and Amiodarone. PICC to RUE. Right radial arterial line. Multiple PIVs. No urinary containment devices. Patient remains on supplemental O2 via [place] at 3 L / min. Patient's admission profile completed overnight by this RN.    Problem: Education: Goal: Knowledge of disease or condition will improve Outcome: Progressing Goal: Understanding of medication regimen will improve Outcome: Progressing Goal: Individualized Educational Video(s) Outcome: Progressing   Problem: Activity: Goal: Ability to tolerate increased activity will improve Outcome: Progressing   Problem: Cardiac: Goal: Ability to achieve and maintain adequate cardiopulmonary perfusion will improve Outcome: Progressing   Problem: Health Behavior/Discharge Planning: Goal: Ability to safely manage health-related needs after discharge will improve Outcome: Progressing   Problem: Education: Goal: Knowledge of General Education information will improve Description: Including pain rating scale, medication(s)/side effects and non-pharmacologic comfort measures Outcome: Progressing   Problem: Health Behavior/Discharge Planning: Goal: Ability to manage health-related needs will improve Outcome: Progressing   Problem: Clinical Measurements: Goal: Ability to maintain clinical measurements within normal limits will improve Outcome: Progressing Goal: Will remain free from infection Outcome: Progressing Goal: Diagnostic test results will improve Outcome: Progressing Goal: Respiratory complications will improve Outcome: Progressing Goal: Cardiovascular complication will be avoided Outcome: Progressing   Problem: Activity: Goal: Risk for activity intolerance will decrease Outcome: Progressing   Problem: Nutrition: Goal: Adequate nutrition will be maintained Outcome: Progressing   Problem: Coping: Goal: Level of  anxiety will decrease Outcome: Progressing   Problem: Elimination: Goal: Will not experience complications related to bowel motility Outcome: Progressing Goal: Will not experience complications related to urinary retention Outcome: Progressing   Problem: Pain Managment: Goal: General experience of comfort will improve and/or be controlled Outcome: Progressing   Problem: Safety: Goal: Ability to remain free from injury will improve Outcome: Progressing   Problem: Skin Integrity: Goal: Risk for impaired skin integrity will decrease Outcome: Progressing   Problem: Education: Goal: Ability to demonstrate management of disease process will improve Outcome: Progressing Goal: Ability to verbalize understanding of medication therapies will improve Outcome: Progressing Goal: Individualized Educational Video(s) Outcome: Progressing   Problem: Activity: Goal: Capacity to carry out activities will improve Outcome: Progressing   Problem: Cardiac: Goal: Ability to achieve and maintain adequate cardiopulmonary perfusion will improve Outcome: Progressing

## 2023-04-17 NOTE — Plan of Care (Signed)

## 2023-04-17 NOTE — Progress Notes (Signed)
Advanced Heart Failure Rounding Note  Cardiologist: [name], MD  Chief Complaint:  Subjective:    Finally starting to urinate last night, feeling significantly improved this morning.    Objective:   Weight Range: 88.8 kg Body mass index is 27.3 kg/m.   Vital Signs:   Temp:  [97.5 F (36.4 C)-97.9 F (36.6 C)] 97.5 F (36.4 C) (02/02 0732) Pulse Rate:  [70-154] 105 (02/02 1000) Resp:  [14-38] 24 (02/02 1000) SpO2:  [81 %-100 %] 98 % (02/02 1000) Arterial Line BP: (65-126)/(52-90) 115/65 (02/02 1000) Weight:  [88.8 kg] 88.8 kg (02/02 0500)    Weight change: Filed Weights   04/15/23 1609 04/17/23 0500  Weight: 89.4 kg 88.8 kg    Intake/Output:   Intake/Output Summary (Last 24 hours) at 04/17/2023 1134 Last data filed at 04/17/2023 0800 Gross per 24 hour  Intake 1132.49 ml  Output 3055 ml  Net -1922.51 ml      Physical Exam    General:  frail, chronically ill appearing Cor: Irregular rate and rhythm, systolic murmur, moderately elevated JVP, 1+ LE edema, extremities warmer today Lungs: Diminished breath sounds bilatearlly Abdomen: Soft, nontender, nondistended.  Telemetry   Afib in the 90s   Labs    CBC Recent Labs    04/15/23 1627 04/16/23 0113  WBC 8.8 8.6  HGB 11.8* 11.8*  HCT 36.9* 37.1*  MCV 98.1 99.5  PLT 130* 122*   Basic Metabolic Panel Recent Labs    16/10/96 1627 04/16/23 0125 04/16/23 1631 04/17/23 0346  NA 137   < > 132* 131*  K 5.1   < > 4.9 4.4  CL 105   < > 101 100  CO2 18*   < > 19* 20*  GLUCOSE 102*   < > 191* 116*  BUN 57*   < > 70* 70*  CREATININE 2.71*   < > 3.38* 3.50*  CALCIUM 9.4   < > 8.9 9.0  MG 2.5*  --   --  2.4   < > = values in this interval not displayed.   Liver Function Tests Recent Labs    04/16/23 0057  AST 273*  ALT 623*  ALKPHOS 137*  BILITOT 1.6*  PROT 6.4*  ALBUMIN 3.4*   No results for input(s): "LIPASE", "AMYLASE" in the last 72 hours. Cardiac Enzymes No results for  input(s): "CKTOTAL", "CKMB", "CKMBINDEX", "TROPONINI" in the last 72 hours.  BNP: BNP (last 3 results) Recent Labs    04/15/23 1627  BNP 2,612.3*    ProBNP (last 3 results) No results for input(s): "PROBNP" in the last 8760 hours.   D-Dimer No results for input(s): "DDIMER" in the last 72 hours. Hemoglobin A1C No results for input(s): "HGBA1C" in the last 72 hours. Fasting Lipid Panel Recent Labs    04/16/23 0057  CHOL 112  HDL 38*  LDLCALC 61  TRIG 66  CHOLHDL 2.9   Thyroid Function Tests No results for input(s): "TSH", "T4TOTAL", "T3FREE", "THYROIDAB" in the last 72 hours.  Invalid input(s): "FREET3"  Other results:   Medications:     Scheduled Medications:  apixaban  2.5 mg Oral BID   Chlorhexidine Gluconate Cloth  6 each Topical Daily   gabapentin  300 mg Oral QHS   mometasone-formoterol  2 puff Inhalation BID   montelukast  10 mg Oral QHS   rosuvastatin  10 mg Oral Daily   sodium chloride flush  10-40 mL Intracatheter Q12H    Infusions:  amiodarone 30 mg/hr (04/17/23  0800)   furosemide (LASIX) 200 mg in dextrose 5 % 100 mL (2 mg/mL) infusion 20 mg/hr (04/17/23 0800)   milrinone 0.125 mcg/kg/min (04/17/23 0800)   norepinephrine (LEVOPHED) Adult infusion Stopped (04/16/23 0827)    PRN Medications: acetaminophen, ondansetron (ZOFRAN) IV, sodium chloride flush    Patient Profile   Brandon [name] is a 83 y.o. 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  Assessment/Plan    Cardiogenic shock: Presented with elevated lactate, hepatic congestion, borderline BP in the setting of recurrent atrial fibrillation. Not an advanced therapies candidate given age.  ACC/AHA Stage D end stage cardiomyopathy. Responding to IV milrinone and  aggressive diuresis. - IV milrinone 0.125, no further titration with renal disease and improvement - Coox likely spurious, but patient improving - IV lasix 20mg /hour - Holding home jardiance currently, restart as renal function improves - CVP monitoring - Metolazone 5mg  daily  Atrial fibrillation: Unclear if primary cause or secondary to his decompensation. Rates not elevated so will optimize medically prior to cardioversion. Only has missed one dose of OAC, this past Sunday. - Improving, continue IV amiodarone load - Will need TEE/DCCV before discharge  Shock liver: Elevated on arrival, secondary to congestion and shock.  - Repeat tomorrow  AKI on CKD: Secondary to cardiorenal syndrome and shock. - Diuresis and care as above  Hyperkalemia: Improving with aggressive diuresis.  COPD:  - Home inhalers  HLD: - Holding home statin with LFT increase  Medication concerns reviewed with patient and pharmacy team. Barriers identified: AKI, hepatic injury  Length of Stay: 2  Romie Minus, MD  04/17/2023, 11:34 AM  Advanced Heart Failure Team Pager [phone] (M-F; 7a - 5p)  Please contact [facility] for night-coverage after hours (5p -7a ) and weekends on [email or web address]  CRITICAL CARE Performed by: Romie Minus   Total critical care time: 34 minutes  Critical care time was exclusive of separately billable procedures and treating other patients.  Critical care was necessary to treat or prevent imminent or life-threatening deterioration.  Critical care was time spent personally by me on the following activities: development of treatment plan with patient and/or surrogate as well as nursing, discussions with consultants, evaluation of patient's response to treatment, examination of patient, obtaining history from patient or surrogate, ordering and performing treatments and interventions, ordering and review of laboratory studies, ordering and review of radiographic studies,  pulse oximetry and re-evaluation of patient's condition.

## 2023-04-18 DIAGNOSIS — I4891 Unspecified atrial fibrillation: Secondary | ICD-10-CM | POA: Diagnosis not present

## 2023-04-18 DIAGNOSIS — R570 Cardiogenic shock: Secondary | ICD-10-CM | POA: Diagnosis not present

## 2023-04-18 LAB — BASIC METABOLIC PANEL
Anion gap: 13 (ref 5–15)
Anion gap: 16 — ABNORMAL HIGH (ref 5–15)
BUN: 74 mg/dL — ABNORMAL HIGH (ref 8–23)
BUN: 78 mg/dL — ABNORMAL HIGH (ref 8–23)
CO2: 28 mmol/L (ref 22–32)
CO2: 26 mmol/L (ref 22–32)
Calcium: 9.5 mg/dL (ref 8.9–10.3)
Calcium: 9.5 mg/dL (ref 8.9–10.3)
Chloride: 91 mmol/L — ABNORMAL LOW (ref 98–111)
Chloride: 90 mmol/L — ABNORMAL LOW (ref 98–111)
Creatinine, Ser: 3.29 mg/dL — ABNORMAL HIGH (ref 0.61–1.24)
Creatinine, Ser: 3.26 mg/dL — ABNORMAL HIGH (ref 0.61–1.24)
GFR, Estimated: 18 mL/min — ABNORMAL LOW (ref >60)
GFR, Estimated: 18 mL/min — ABNORMAL LOW (ref >60)
Glucose, Bld: 121 mg/dL — ABNORMAL HIGH (ref 70–99)
Glucose, Bld: 137 mg/dL — ABNORMAL HIGH (ref 70–99)
Potassium: 4 mmol/L (ref 3.5–5.1)
Potassium: 4.3 mmol/L (ref 3.5–5.1)
Sodium: 132 mmol/L — ABNORMAL LOW (ref 135–145)
Sodium: 132 mmol/L — ABNORMAL LOW (ref 135–145)

## 2023-04-18 LAB — HEPATIC FUNCTION PANEL
ALT: 1001 U/L — ABNORMAL HIGH (ref 0–44)
AST: 191 U/L — ABNORMAL HIGH (ref 15–41)
Albumin: 3.3 g/dL — ABNORMAL LOW (ref 3.5–5.0)
Alkaline Phosphatase: 128 U/L — ABNORMAL HIGH (ref 38–126)
Bilirubin, Direct: 0.3 mg/dL — ABNORMAL HIGH (ref 0.0–0.2)
Indirect Bilirubin: 0.6 mg/dL (ref 0.3–0.9)
Total Bilirubin: 0.9 mg/dL (ref 0.0–1.2)
Total Protein: 6.1 g/dL — ABNORMAL LOW (ref 6.5–8.1)

## 2023-04-18 LAB — COOXEMETRY PANEL
Carboxyhemoglobin: 0.8 % (ref 0.5–1.5)
Methemoglobin: <0.7 % (ref 0.0–1.5)
O2 Saturation: 61.1 %
Total hemoglobin: 12.3 g/dL (ref 12.0–16.0)

## 2023-04-18 LAB — MAGNESIUM: Magnesium: 2.7 mg/dL — ABNORMAL HIGH (ref 1.7–2.4)

## 2023-04-18 MED ORDER — SODIUM CHLORIDE 0.9% FLUSH: 3.0000 mL | INTRAVENOUS | Status: DC | PRN

## 2023-04-18 MED ORDER — SODIUM CHLORIDE 0.9% FLUSH
3.0000 mL | Freq: Two times a day (BID) | INTRAVENOUS | Status: DC
  Administered 2023-04-18 – 2023-04-19 (×2): 3 mL via INTRAVENOUS

## 2023-04-18 MED ORDER — ACETAZOLAMIDE 250 MG PO TABS
500.0000 mg | ORAL_TABLET | Freq: Once | ORAL | Status: AC
Start: 2023-04-18 — End: 2023-04-18
  Administered 2023-04-18: 500 mg via ORAL

## 2023-04-18 NOTE — Plan of Care (Signed)
  Problem: Education: Goal: Knowledge of disease or condition will improve Outcome: Progressing Goal: Understanding of medication regimen will improve Outcome: Progressing Goal: Individualized Educational Video(s) Outcome: Progressing   Problem: Activity: Goal: Ability to tolerate increased activity will improve Outcome: Progressing   Problem: Cardiac: Goal: Ability to achieve and maintain adequate cardiopulmonary perfusion will improve Outcome: Progressing   Problem: Health Behavior/Discharge Planning: Goal: Ability to safely manage health-related needs after discharge will improve Outcome: Progressing   Problem: Education: Goal: Knowledge of General Education information will improve Description: Including pain rating scale, medication(s)/side effects and non-pharmacologic comfort measures Outcome: Progressing   Problem: Health Behavior/Discharge Planning: Goal: Ability to manage health-related needs will improve Outcome: Progressing   Problem: Clinical Measurements: Goal: Ability to maintain clinical measurements within normal limits will improve Outcome: Progressing Goal: Will remain free from infection Outcome: Progressing Goal: Diagnostic test results will improve Outcome: Progressing Goal: Respiratory complications will improve Outcome: Progressing Goal: Cardiovascular complication will be avoided Outcome: Progressing   Problem: Activity: Goal: Risk for activity intolerance will decrease Outcome: Progressing   Problem: Nutrition: Goal: Adequate nutrition will be maintained Outcome: Progressing   Problem: Coping: Goal: Level of anxiety will decrease Outcome: Progressing   Problem: Elimination: Goal: Will not experience complications related to bowel motility Outcome: Progressing Goal: Will not experience complications related to urinary retention Outcome: Progressing   Problem: Pain Managment: Goal: General experience of comfort will improve and/or be  controlled Outcome: Progressing   Problem: Safety: Goal: Ability to remain free from injury will improve Outcome: Progressing   Problem: Skin Integrity: Goal: Risk for impaired skin integrity will decrease Outcome: Progressing   Problem: Education: Goal: Ability to demonstrate management of disease process will improve Outcome: Progressing Goal: Ability to verbalize understanding of medication therapies will improve Outcome: Progressing Goal: Individualized Educational Video(s) Outcome: Progressing   Problem: Activity: Goal: Capacity to carry out activities will improve Outcome: Progressing   Problem: Cardiac: Goal: Ability to achieve and maintain adequate cardiopulmonary perfusion will improve Outcome: Progressing

## 2023-04-18 NOTE — TOC Initial Note (Signed)
Transition of Care [facility]) - Initial/Assessment Note    Patient Details  Name: Brandon [name] MRN: [id number] Date of Birth: 1940/12/14  [facility] CM/SW Contact:    Elliot Cousin, RN Phone Number:[phone] 04/18/2023, 6:35 PM  Clinical Narrative:                 TO CM spoke to pt at bedside. States he was independent pta. Lives at home with wife. Gave permission to speak to wife. Pt report having cane at home.   Will continue to follow for dc needs.   Expected Discharge Plan: Home/Self Care Barriers to Discharge: Continued Medical Work up   Patient Goals and CMS Choice Patient states their goals for this hospitalization and ongoing recovery are:: wants to get better          Expected Discharge Plan and Services   Discharge Planning Services: CM Consult   Living arrangements for the past 2 months: Single Family Home                                      Prior Living Arrangements/Services Living arrangements for the past 2 months: Single Family Home Lives with:: Spouse Patient language and need for interpreter reviewed:: Yes Do you feel safe going back to the place where you live?: Yes      Need for Family Participation in Patient Care: Yes (Comment) Care giver support system in place?: Yes (comment) Current home services: DME (cane) Criminal Activity/Legal Involvement Pertinent to Current Situation/Hospitalization: No - Comment as needed  Activities of Daily Living   ADL Screening (condition at time of admission) Independently performs ADLs?: Yes (appropriate for developmental age) Is the patient deaf or have difficulty hearing?: No Does the patient have difficulty seeing, even when wearing glasses/contacts?: No Does the patient have difficulty concentrating, remembering, or making decisions?: No  Permission Sought/Granted Permission sought to share information with : Case Manager, Family Supports, PCP Permission granted to share  information with : Yes, Verbal Permission Granted  Share Information with NAME: [name] [name]     Permission granted to share info w Relationship: wife  Permission granted to share info w Contact Information: [phone]  Emotional Assessment Appearance:: Appears stated age Attitude/Demeanor/Rapport: Engaged Affect (typically observed): Accepting Orientation: : Oriented to Self, Oriented to Place, Oriented to  Time, Oriented to Situation   Psych Involvement: No (comment)  Admission diagnosis:  Atrial fibrillation (HCC) [I48.91] Persistent atrial fibrillation (HCC) [I48.19] Atrial fibrillation with RVR (HCC) [I48.91] Patient Active Problem List   Diagnosis Date Noted   Cardiogenic shock (HCC) 04/16/2023   Atrial fibrillation with RVR (HCC) 04/15/2023   Persistent atrial fibrillation (HCC) 01/14/2023   Mitral regurgitation 01/14/2023   Thrombus of left atrial appendage 01/14/2023   Acute kidney injury superimposed on chronic kidney disease (HCC) 01/14/2023   Acute on chronic systolic heart failure (HCC) 01/10/2023   Neuropathy 09/21/2022   Abnormal brain MRI 04/21/2022   Hyperlipidemia 04/21/2022   CVA (cerebral vascular accident) (HCC) 04/18/2022   Normocytic anemia 04/18/2022   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   Coronary artery calcification 03/30/2022   B12 deficiency 08/13/2020   Rheumatoid arthritis (HCC) 08/12/2020   BPH (benign prostatic hyperplasia) 08/12/2020   Folic acid deficiency 08/12/2020   Stage 3a chronic kidney disease (HCC) 10/17/2019   Heme positive stool 02/01/2019   RBBB 10/03/2018   Depression 12/09/2015   Asthma 08/13/2009  Hyperglycemia 08/13/2009   Obstructive sleep apnea 01/23/2009   CHRONIC SYSTOLIC HEART FAILURE 09/11/2008   Obesity, unspecified 08/26/2008   Disorder resulting from impaired renal function 08/26/2008   Essential hypertension 06/28/2008   Nonischemic cardiomyopathy (HCC) 06/28/2008   PCP:  [name], NP Pharmacy:   [facility] [address] [place], [place] - [address] [place] [address] Phone: [phone] Fax: [phone]  [facility] [address] [place] [address] Phone: [phone] Fax: [phone]     Social Drivers of Health (SDOH) Social History: SDOH Screenings   Food Insecurity: No Food Insecurity (04/16/2023)  Housing: Low Risk  (04/17/2023)  Transportation Needs: No Transportation Needs (04/16/2023)  Utilities: Not At Risk (04/16/2023)  Alcohol Screen: Low Risk  (07/01/2022)  Depression (PHQ2-9): Low Risk  (02/03/2023)  Financial Resource Strain: Low Risk  (01/13/2023)  Physical Activity: Insufficiently Active (07/25/2020)  Social Connections: Socially Integrated (04/16/2023)  Stress: No Stress Concern Present (05/28/2021)  Tobacco Use: Medium Risk (04/15/2023)  Health Literacy: Adequate Health Literacy (01/27/2023)   SDOH Interventions: Food Insecurity Interventions: Intervention Not Indicated Housing Interventions: Intervention Not Indicated Transportation Interventions: Intervention Not Indicated Utilities Interventions: Intervention Not Indicated Social Connections Interventions: Intervention Not Indicated   Readmission Risk Interventions     No data to display

## 2023-04-18 NOTE — Progress Notes (Addendum)
Advanced Heart Failure Rounding Note  Cardiologist: [name], MD  Chief Complaint: Cardiogenic Shock     Patient Profile   83 y/o M w/ h/o Stage D systolic HF 2/2 NICM, PAF, HTN, CKD, COPD, pulmonary HTN, OSA not on CPAP and h/o CVA admitted w/ CG in the setting of recurrent atrial fibrillation.     Subjective:    On Milrinone 0.125 + Lasix gtt at 20/hr + amio gtt at 30/hr. Also got dose of metolazone 5 mg yesterday.   Co-ox 61%. Responding well w/ brisk diuresis yesterday, 6L in UOP. Net negative 5L for the day. CVP 8-9. Wt trending down. SCr 3.50>>3.29 but ALT bumped 623>>1,001.   K 4.0 Mg 2.7   Remains in Afib, V-rates 90s-low 100s.   Sitting up in bed. Reports improvement in symptoms. Feels better. Dyspnea improved. Appetite is good. Sleeping ok. Had BM yesterday. No complaints.    Objective:   Weight Range: 82 kg Body mass index is 25.21 kg/m.   Vital Signs:   Temp:  [96.8 F (36 C)-98 F (36.7 C)] 97.3 F (36.3 C) (02/03 0700) Pulse Rate:  [74-154] 74 (02/03 0800) Resp:  [14-26] 17 (02/03 0800) BP: (102-123)/(65-98) 119/79 (02/03 0600) SpO2:  [91 %-100 %] 100 % (02/03 0800) Arterial Line BP: (101-127)/(50-72) 112/55 (02/03 0800) Weight:  [82 kg] 82 kg (02/03 0459)    Weight change: Filed Weights   04/15/23 1609 04/17/23 0500 04/18/23 0459  Weight: 89.4 kg 88.8 kg 82 kg    Intake/Output:   Intake/Output Summary (Last 24 hours) at 04/18/2023 0102 Last data filed at 04/18/2023 0800 Gross per 24 hour  Intake 1267.52 ml  Output 6020 ml  Net -4752.48 ml      Physical Exam    CVP 9  General:  frail elderly male, no respiratory distress  HEENT: dentition in poor repair, otherwise normal Neck: supple. JVD 9 cm. Carotids 2+ bilat; no bruits. No lymphadenopathy or thyromegaly appreciated. Cor: PMI nondisplaced. Irregularly irregular rhythm and rate. No rubs, gallops or murmurs. Lungs: decreased BS at the bases bilaterally Abdomen: soft,  nontender, nondistended. No hepatosplenomegaly. No bruits or masses. Good bowel sounds. Extremities: no cyanosis, clubbing, rash, trace b/l LE edema + SCDs +RUE PICC Neuro: alert & oriented x 3, cranial nerves grossly intact. moves all 4 extremities w/o difficulty. Affect pleasant.   Telemetry   Afib 90s-low 100s, occasional PVCs, personally reviewed    Labs    CBC Recent Labs    04/15/23 1627 04/16/23 0113 04/16/23 1715  WBC 8.8 8.6  --   HGB 11.8* 11.8* 11.2*  HCT 36.9* 37.1* 33.0*  MCV 98.1 99.5  --   PLT 130* 122*  --    Basic Metabolic Panel Recent Labs    72/53/66 0346 04/17/23 1800 04/18/23 0445  NA 131* 132* 132*  K 4.4 4.4 4.0  CL 100 95* 91*  CO2 20* 22 28  GLUCOSE 116* 116* 121*  BUN 70* 78* 74*  CREATININE 3.50* 3.42* 3.29*  CALCIUM 9.0 9.3 9.5  MG 2.4  --  2.7*   Liver Function Tests Recent Labs    04/16/23 0057 04/18/23 0445  AST 273* 191*  ALT 623* 1,001*  ALKPHOS 137* 128*  BILITOT 1.6* 0.9  PROT 6.4* 6.1*  ALBUMIN 3.4* 3.3*   No results for input(s): "LIPASE", "AMYLASE" in the last 72 hours. Cardiac Enzymes No results for input(s): "CKTOTAL", "CKMB", "CKMBINDEX", "TROPONINI" in the last 72 hours.  BNP: BNP (last 3 results)  Recent Labs    04/15/23 1627  BNP 2,612.3*    ProBNP (last 3 results) No results for input(s): "PROBNP" in the last 8760 hours.   D-Dimer No results for input(s): "DDIMER" in the last 72 hours. Hemoglobin A1C No results for input(s): "HGBA1C" in the last 72 hours. Fasting Lipid Panel Recent Labs    04/16/23 0057  CHOL 112  HDL 38*  LDLCALC 61  TRIG 66  CHOLHDL 2.9   Thyroid Function Tests No results for input(s): "TSH", "T4TOTAL", "T3FREE", "THYROIDAB" in the last 72 hours.  Invalid input(s): "FREET3"  Other results:   Medications:     Scheduled Medications:  apixaban  2.5 mg Oral BID   Chlorhexidine Gluconate Cloth  6 each Topical Daily   feeding supplement  237 mL Oral BID BM    gabapentin  300 mg Oral QHS   mometasone-formoterol  2 puff Inhalation BID   montelukast  10 mg Oral QHS   sodium chloride flush  10-40 mL Intracatheter Q12H    Infusions:  amiodarone 30 mg/hr (04/18/23 0800)   furosemide (LASIX) 200 mg in dextrose 5 % 100 mL (2 mg/mL) infusion 20 mg/hr (04/18/23 0800)   milrinone 0.125 mcg/kg/min (04/18/23 0800)    PRN Medications: acetaminophen, ondansetron (ZOFRAN) IV, sodium chloride flush    Assessment/Plan    Cardiogenic shock: Presented with elevated lactate, hepatic congestion, borderline BP in the setting of recurrent atrial fibrillation. ACC/AHA Stage D end stage cardiomyopathy. Not an advanced therapies candidate given age. Using milrinone and lasix gtt for palliative diuresis, responding well to therapies. Remains volume up.  - continue milrinone 0.125 until fully diuresed - continue Lasix gtt at 20/hr +diamox 500 x 1. Anticipate transition to scheduled IV later today - Holding home jardiance currently, restart as renal function improves - other GDMT limited by CKD    Atrial fibrillation: Unclear if primary cause or secondary to his decompensation. Rates not elevated so will optimize medically prior to cardioversion. Only has missed one dose of OAC, this past Sunday. - Improving, continue IV amiodarone gtt - Will need TEE/DCCV before discharge, once diuresed and off milrinone   Shock liver: Elevated on arrival, secondary to congestion and shock.  - remains elevated, ALT trending up, AST down  - continue milrinone and diuresis  - follow daily CMP   AKI on CKD IIIb: Secondary to cardiorenal syndrome and shock. - SCr improving, 3.5>>3.2 - milrinone and lasix gtt per above - follow daily CMP   Hyperkalemia:  - resolved, K 4.0 today - monitor closely w/ diuresis   COPD:  - Home inhalers  HLD: - Holding home statin with LFT increase  CRITICAL CARE Performed by: Robbie Lis   Total critical care time: 20  minutes  Critical care time was exclusive of separately billable procedures and treating other patients.  Critical care was necessary to treat or prevent imminent or life-threatening deterioration.  Critical care was time spent personally by me on the following activities: development of treatment plan with patient and/or surrogate as well as nursing, discussions with consultants, evaluation of patient's response to treatment, examination of patient, obtaining history from patient or surrogate, ordering and performing treatments and interventions, ordering and review of laboratory studies, ordering and review of radiographic studies, pulse oximetry and re-evaluation of patient's condition.    Length of Stay: [address] Delmer Islam  04/18/2023, 8:52 AM  Advanced Heart Failure Team Pager [phone] (M-F; 7a - 5p)  Please contact [facility] for night-coverage after hours (  5p -7a ) and weekends on [email or web address]

## 2023-04-19 ENCOUNTER — Inpatient Hospital Stay (HOSPITAL_COMMUNITY): Payer: Medicare Other | Admitting: Anesthesiology

## 2023-04-19 ENCOUNTER — Encounter (HOSPITAL_COMMUNITY)
Admission: EM | Disposition: A | Discharge status: Skilled Nursing Facility | Payer: Self-pay | Source: Home / Self Care | Attending: Cardiology

## 2023-04-19 ENCOUNTER — Encounter (HOSPITAL_COMMUNITY): Payer: Self-pay | Admitting: Internal Medicine

## 2023-04-19 ENCOUNTER — Inpatient Hospital Stay (HOSPITAL_COMMUNITY): Payer: Medicare Other

## 2023-04-19 DIAGNOSIS — I2510 Atherosclerotic heart disease of native coronary artery without angina pectoris: Secondary | ICD-10-CM

## 2023-04-19 DIAGNOSIS — I5023 Acute on chronic systolic (congestive) heart failure: Secondary | ICD-10-CM | POA: Diagnosis not present

## 2023-04-19 DIAGNOSIS — I4891 Unspecified atrial fibrillation: Secondary | ICD-10-CM | POA: Diagnosis not present

## 2023-04-19 DIAGNOSIS — I110 Hypertensive heart disease with heart failure: Secondary | ICD-10-CM

## 2023-04-19 DIAGNOSIS — I509 Heart failure, unspecified: Secondary | ICD-10-CM

## 2023-04-19 DIAGNOSIS — I340 Nonrheumatic mitral (valve) insufficiency: Secondary | ICD-10-CM

## 2023-04-19 HISTORY — PX: TRANSESOPHAGEAL ECHOCARDIOGRAM (CATH LAB): EP1270

## 2023-04-19 HISTORY — PX: CARDIOVERSION: EP1203

## 2023-04-19 LAB — HEPATIC FUNCTION PANEL
ALT: 688 U/L — ABNORMAL HIGH (ref 0–44)
AST: 97 U/L — ABNORMAL HIGH (ref 15–41)
Albumin: 3.1 g/dL — ABNORMAL LOW (ref 3.5–5.0)
Alkaline Phosphatase: 113 U/L (ref 38–126)
Bilirubin, Direct: 0.3 mg/dL — ABNORMAL HIGH (ref 0.0–0.2)
Indirect Bilirubin: 0.8 mg/dL (ref 0.3–0.9)
Total Bilirubin: 1.1 mg/dL (ref 0.0–1.2)
Total Protein: 5.9 g/dL — ABNORMAL LOW (ref 6.5–8.1)

## 2023-04-19 LAB — BASIC METABOLIC PANEL
Anion gap: 17 — ABNORMAL HIGH (ref 5–15)
BUN: 75 mg/dL — ABNORMAL HIGH (ref 8–23)
CO2: 28 mmol/L (ref 22–32)
Calcium: 9.1 mg/dL (ref 8.9–10.3)
Chloride: 83 mmol/L — ABNORMAL LOW (ref 98–111)
Creatinine, Ser: 3.12 mg/dL — ABNORMAL HIGH (ref 0.61–1.24)
GFR, Estimated: 19 mL/min — ABNORMAL LOW (ref >60)
Glucose, Bld: 322 mg/dL — ABNORMAL HIGH (ref 70–99)
Potassium: 3.8 mmol/L (ref 3.5–5.1)
Sodium: 128 mmol/L — ABNORMAL LOW (ref 135–145)

## 2023-04-19 LAB — COOXEMETRY PANEL
Carboxyhemoglobin: 0.8 % (ref 0.5–1.5)
Carboxyhemoglobin: 0.9 % (ref 0.5–1.5)
Carboxyhemoglobin: 0.6 % (ref 0.5–1.5)
Methemoglobin: <0.7 % (ref 0.0–1.5)
Methemoglobin: <0.7 % (ref 0.0–1.5)
Methemoglobin: <0.7 % (ref 0.0–1.5)
O2 Saturation: 68.1 %
O2 Saturation: 52 %
O2 Saturation: 47.7 %
Total hemoglobin: 11.7 g/dL — ABNORMAL LOW (ref 12.0–16.0)
Total hemoglobin: 12.3 g/dL (ref 12.0–16.0)
Total hemoglobin: 13.1 g/dL (ref 12.0–16.0)

## 2023-04-19 LAB — LACTIC ACID, PLASMA: Lactic Acid, Venous: 1.6 mmol/L (ref 0.5–1.9)

## 2023-04-19 LAB — CBC
HCT: 35.6 % — ABNORMAL LOW (ref 39.0–52.0)
Hemoglobin: 11.9 g/dL — ABNORMAL LOW (ref 13.0–17.0)
MCH: 31.6 pg (ref 26.0–34.0)
MCHC: 33.4 g/dL (ref 30.0–36.0)
MCV: 94.7 fL (ref 80.0–100.0)
Platelets: 143 10*3/uL — ABNORMAL LOW (ref 150–400)
RBC: 3.76 MIL/uL — ABNORMAL LOW (ref 4.22–5.81)
RDW: 15.7 % — ABNORMAL HIGH (ref 11.5–15.5)
WBC: 8.2 10*3/uL (ref 4.0–10.5)
nRBC: 0.2 % (ref 0.0–0.2)

## 2023-04-19 LAB — GLUCOSE, CAPILLARY: Glucose-Capillary: 111 mg/dL — ABNORMAL HIGH (ref 70–99)

## 2023-04-19 LAB — MAGNESIUM: Magnesium: 2.3 mg/dL (ref 1.7–2.4)

## 2023-04-19 SURGERY — TRANSESOPHAGEAL ECHOCARDIOGRAM (TEE) (CATHLAB)
Anesthesia: Monitor Anesthesia Care

## 2023-04-19 MED ORDER — MILRINONE LACTATE IN DEXTROSE 20-5 MG/100ML-% IV SOLN
0.1250 ug/kg/min | INTRAVENOUS | Status: DC
Start: 2023-04-19 — End: 2023-04-22
  Administered 2023-04-19 – 2023-04-21 (×2): 0.125 ug/kg/min via INTRAVENOUS
  Filled 2023-04-19 (×2): qty 100

## 2023-04-19 MED ORDER — PHENYLEPHRINE 80 MCG/ML (10ML) SYRINGE FOR IV PUSH (FOR BLOOD PRESSURE SUPPORT)
PREFILLED_SYRINGE | INTRAVENOUS | Status: DC | PRN
  Administered 2023-04-19 (×2): 80 ug via INTRAVENOUS

## 2023-04-19 MED ORDER — POTASSIUM CHLORIDE CRYS ER 20 MEQ PO TBCR
20.0000 meq | EXTENDED_RELEASE_TABLET | Freq: Once | ORAL | Status: AC
  Administered 2023-04-19: 20 meq via ORAL
  Filled 2023-04-19: qty 1

## 2023-04-19 MED ORDER — ORAL CARE MOUTH RINSE: 15.0000 mL | OROMUCOSAL | Status: DC | PRN

## 2023-04-19 MED ORDER — PROPOFOL 500 MG/50ML IV EMUL
INTRAVENOUS | Status: DC | PRN
  Administered 2023-04-19: 50 mg via INTRAVENOUS
  Administered 2023-04-19: 250 ug/kg/min via INTRAVENOUS

## 2023-04-19 MED ORDER — SODIUM CHLORIDE 0.9 % IV SOLN: INTRAVENOUS | Status: DC | PRN

## 2023-04-19 MED ORDER — AMIODARONE IV BOLUS ONLY 150 MG/100ML
INTRAVENOUS | Status: DC | PRN
  Administered 2023-04-19: 150 mg via INTRAVENOUS

## 2023-04-19 MED ORDER — AMIODARONE HCL IN DEXTROSE 360-4.14 MG/200ML-% IV SOLN
INTRAVENOUS | Status: AC
  Filled 2023-04-19: qty 200

## 2023-04-19 SURGICAL SUPPLY — 1 items: PAD DEFIB RADIO PHYSIO CONN (PAD) ×1 IMPLANT

## 2023-04-19 NOTE — Anesthesia Postprocedure Evaluation (Signed)
 Anesthesia Post Note  Patient: Brandon [name]  Procedure(s) Performed: TRANSESOPHAGEAL ECHOCARDIOGRAM CARDIOVERSION     Patient location during evaluation: PACU Anesthesia Type: MAC Level of consciousness: awake and alert Pain management: pain level controlled Vital Signs Assessment: post-procedure vital signs reviewed and stable Respiratory status: spontaneous breathing, nonlabored ventilation and respiratory function stable Cardiovascular status: stable and blood pressure returned to baseline Anesthetic complications: no   No notable events documented.  Last Vitals:  Vitals:   04/19/23 1600 04/19/23 1613  BP:    Pulse: 71   Resp: 19   Temp:  36.6 C  SpO2: 100%     Last Pain:  Vitals:   04/19/23 1613  TempSrc: Oral  PainSc:                  Debby FORBES Like

## 2023-04-19 NOTE — Interval H&P Note (Signed)
 History and Physical Interval Note:  04/19/2023 11:52 AM  Brandon [name]  has presented today for surgery, with the diagnosis of afib.  The various methods of treatment have been discussed with the patient and family. After consideration of risks, benefits and other options for treatment, the patient has consented to  Procedure(s): TRANSESOPHAGEAL ECHOCARDIOGRAM (N/A) CARDIOVERSION (N/A) as a surgical intervention.  The patient's history has been reviewed, patient examined, no change in status, stable for surgery.  I have reviewed the patient's chart and labs.  Questions were answered to the patient's satisfaction.     [name]

## 2023-04-19 NOTE — Anesthesia Preprocedure Evaluation (Addendum)
Anesthesia Evaluation  Patient identified by MRN, date of birth, ID band Patient awake    Reviewed: Allergy & Precautions, NPO status , Patient's Chart, lab work & pertinent test results, reviewed documented beta blocker date and time   History of Anesthesia Complications Negative for: history of anesthetic complications  Airway Mallampati: II  TM Distance: >3 FB Neck ROM: Full    Dental  (+) Edentulous Upper   Pulmonary asthma , sleep apnea and Continuous Positive Airway Pressure Ventilation , former smoker   Pulmonary exam normal        Cardiovascular hypertension, Pt. on home beta blockers and Pt. on medications + CAD and +CHF  + dysrhythmias Atrial Fibrillation + Valvular Problems/Murmurs MR  Rhythm:Irregular Rate:Tachycardia   '24 TEE - EF 25 to 30%. Global hypokinesis. The left ventricular internal cavity size was moderately dilated. Right ventricular systolic function is mildly reduced. The right  ventricular size is mildly enlarged. Left atrial size was severely dilated. Right atrial size was mildly dilated. Mild to moderate mitral valve regurgitation. Aortic valve regurgitation is trivial. There is mild (Grade II) plaque involving the descending aorta.     Neuro/Psych  PSYCHIATRIC DISORDERS  Depression    CVA, No Residual Symptoms    GI/Hepatic negative GI ROS,,,(+) Hepatitis -  Endo/Other   Na 128 (corrected in setting of hyperglycemia = 133. BS since improved)     Renal/GU CRFRenal disease     Musculoskeletal  (+) Arthritis ,    Abdominal   Peds  Hematology  (+) Blood dyscrasia, anemia  On eliquis Plt 143k INR 1.8    Anesthesia Other Findings   Reproductive/Obstetrics                             Anesthesia Physical Anesthesia Plan  ASA: 4  Anesthesia Plan: MAC and General   Post-op Pain Management:    Induction:   PONV Risk Score and Plan: 2 and Propofol infusion  and Treatment may vary due to age or medical condition  Airway Management Planned: Natural Airway and Nasal Cannula  Additional Equipment: None  Intra-op Plan:   Post-operative Plan:   Informed Consent: I have reviewed the patients History and Physical, chart, labs and discussed the procedure including the risks, benefits and alternatives for the proposed anesthesia with the patient or authorized representative who has indicated his/her understanding and acceptance.   Patient has DNR.  Discussed DNR with patient and Suspend DNR.     Plan Discussed with: CRNA and Anesthesiologist  Anesthesia Plan Comments: (May begin procedure as MAC with conversion to GA as indicated by procedure )        Anesthesia Quick Evaluation

## 2023-04-19 NOTE — Transfer of Care (Signed)
 Immediate Anesthesia Transfer of Care Note  Patient: Brandon [name]  Procedure(s) Performed: TRANSESOPHAGEAL ECHOCARDIOGRAM CARDIOVERSION  Patient Location: PACU  Anesthesia Type:MAC  Level of Consciousness: sedated  Airway & Oxygen  Therapy: Patient Spontanous Breathing  Post-op Assessment: Report given to RN  Post vital signs: Reviewed and stable see chart  Last Vitals:  Vitals Value Taken Time  BP    Temp    Pulse    Resp    SpO2      Last Pain:  Vitals:   04/19/23 1127  TempSrc: Temporal  PainSc:       Patients Stated Pain Goal: 0 (04/17/23 0400)  Complications: No notable events documented.

## 2023-04-19 NOTE — Anesthesia Procedure Notes (Signed)
 Procedure Name: MAC Date/Time: 04/19/2023 2:11 PM  Performed by: Crisoforo Burnard KIDD, CRNAPre-anesthesia Checklist: Patient identified, Emergency Drugs available, Suction available, Patient being monitored and Timeout performed Patient Re-evaluated:Patient Re-evaluated prior to induction Preoxygenation: Pre-oxygenation with 100% oxygen  Induction Type: IV induction Dental Injury: Teeth and Oropharynx as per pre-operative assessment

## 2023-04-19 NOTE — H&P (View-Only) (Signed)
 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     Subjective:    Feels well this morning. No current complaints. Denies CP. Dyspnea much improved.   He has diuresed well w/ milrinone  support. Overall down 20 lb since admit. CVP low ~2. Scr improving, down from peak of 3.5>>3.1 today. Milrinone  stopped at 0700 this morning. Co-ox stable 68%.   Remains in Afib, V-rates 90s-low 100s.  K 3.8 Mg 2.3   Objective:   Weight Range: 80.6 kg Body mass index is 24.78 kg/m.   Vital Signs:   Temp:  [96.7 F (35.9 C)-97.9 F (36.6 C)] 97.9 F (36.6 C) (02/03 2000) Pulse Rate:  [67-146] 100 (02/04 0800) Resp:  [13-24] 16 (02/04 0800) BP: (91-118)/(67-94) 104/83 (02/04 0600) SpO2:  [98 %-100 %] 100 % (02/04 0800) Arterial Line BP: (98-121)/(47-75) 121/75 (02/04 0800) Weight:  [80.6 kg] 80.6 kg (02/04 0500) Last BM Date : 04/17/23  Weight change: Filed Weights   04/17/23 0500 04/18/23 0459 04/19/23 0500  Weight: 88.8 kg 82 kg 80.6 kg    Intake/Output:   Intake/Output Summary (Last 24 hours) at 04/19/2023 0824 Last data filed at 04/19/2023 0800 Gross per 24 hour  Intake 1079.64 ml  Output 5850 ml  Net -4770.36 ml      Physical Exam    CVP 3  General: thin/cathectic appearing elderly male, no distress/respiratory difficulty HEENT: normal Neck: supple. JVD not elevated Carotids 2+ bilat; no bruits. No lymphadenopathy or thyromegaly appreciated. Cor: PMI nondisplaced. Irregularly rhythm and rate. No rubs, gallops or murmurs. Lungs: decreased BS at the bases bilaterally, no wheezing  Abdomen: soft, nontender, nondistended. No hepatosplenomegaly. No bruits or masses. Good bowel sounds. Extremities: no cyanosis, clubbing,  rash, no edema +SCDs +RUE PICC Neuro: alert & oriented x 3, cranial nerves grossly intact. moves all 4 extremities w/o difficulty. Affect pleasant.    Telemetry   Atrial fibrillation, 90s-low 100s, personally reviewed   Labs    CBC Recent Labs    04/16/23 1715 04/19/23 0358  WBC  --  8.2  HGB 11.2* 11.9*  HCT 33.0* 35.6*  MCV  --  94.7  PLT  --  143*   Basic Metabolic Panel Recent Labs    97/96/74 0445 04/18/23 1517 04/19/23 0358  NA 132* 132* 128*  K 4.0 4.3 3.8  CL 91* 90* 83*  CO2 28 26 28   GLUCOSE 121* 137* 322*  BUN 74* 78* 75*  CREATININE 3.29* 3.26* 3.12*  CALCIUM  9.5 9.5 9.1  MG 2.7*  --  2.3   Liver Function Tests Recent Labs    04/18/23 0445  AST 191*  ALT 1,001*  ALKPHOS 128*  BILITOT 0.9  PROT 6.1*  ALBUMIN 3.3*   No results for input(s): LIPASE, AMYLASE in the last 72 hours. Cardiac Enzymes No results for input(s): CKTOTAL, CKMB, CKMBINDEX, TROPONINI in the last 72 hours.  BNP: BNP (last 3 results) Recent Labs    04/15/23 1627  BNP 2,612.3*    ProBNP (last 3 results) No results for input(s): PROBNP in the last 8760 hours.   D-Dimer No results for input(s): DDIMER in the last 72 hours. Hemoglobin A1C No results for input(s): HGBA1C in the last 72  hours. Fasting Lipid Panel No results for input(s): CHOL, HDL, LDLCALC, TRIG, CHOLHDL, LDLDIRECT in the last 72 hours.  Thyroid  Function Tests No results for input(s): TSH, T4TOTAL, T3FREE, THYROIDAB in the last 72 hours.  Invalid input(s): FREET3  Other results:   Medications:     Scheduled Medications:  apixaban   2.5 mg Oral BID   Chlorhexidine  Gluconate Cloth  6 each Topical Daily   feeding supplement  237 mL Oral BID BM   gabapentin   300 mg Oral QHS   mometasone -formoterol   2 puff Inhalation BID   montelukast   10 mg Oral QHS   sodium chloride  flush  10-40 mL Intracatheter Q12H   sodium chloride  flush  3-10 mL Intravenous Q12H     Infusions:  amiodarone  60 mg/hr (04/19/23 0800)    PRN Medications: acetaminophen , ondansetron  (ZOFRAN ) IV, mouth rinse, sodium chloride  flush, sodium chloride  flush    Assessment/Plan   Acute on Chronic Systolic Heart Failure>>Cardiogenic shock: Presented with elevated lactate, hepatic congestion, borderline BP in the setting of recurrent atrial fibrillation. ACC/AHA Stage D end stage cardiomyopathy. Not an advanced therapies candidate given age. Using milrinone  and lasix  gtt for palliative diuresis.  - He has diuresed well w/ milrinone  support. Overall down 20 lb since admit. CVP low ~2. Scr improving, down from peak of 3.5>>3.1 today. Co-ox 68% - We will stop lasix  gtt and plan transition to back to torsemide  tomorrow, on higher dose at 60 mg daily (on 40 daily PTA) - Stop milrinone   - plan TEE/DCCV today - GDMT limited by CKD and soft BP   Persistent Atrial fibrillation: Unclear if primary cause or secondary to his decompensation. Has missed one dose of OAC in the last month  - plan TEE/DCCV today  - continue amio gtt at 60/hr and transition to PO post cardioversion  - give K supp (3.8)   Shock liver: Elevated on arrival, secondary to congestion and shock.  - GS improved and diuresed 20 lb - will repeat HFTs today (pending)   AKI on CKD IIIb: Secondary to cardiorenal syndrome and shock. - B/l Scr ~1.2  - Scr peaked 3.5 this admit - Scr improving, 3.1 today - follow CMP  - holding SGLT2i, may be able to restart if SCr continues to improve   COPD:  - Home inhalers  HLD: - Holding home statin with LFT increase - repeat HFTs pending    CRITICAL CARE Performed by: Caffie Shed, PA-C    Total critical care time: 20 minutes  Critical care time was exclusive of separately billable procedures and treating other patients.  Critical care was necessary to treat or prevent imminent or life-threatening deterioration.  Critical care was time spent personally by  me on the following activities: development of treatment plan with patient and/or surrogate as well as nursing, discussions with consultants, evaluation of patient's response to treatment, examination of patient, obtaining history from patient or surrogate, ordering and performing treatments and interventions, ordering and review of laboratory studies, ordering and review of radiographic studies, pulse oximetry and re-evaluation of patient's condition.    Length of Stay: [address], PA-C  04/19/2023, 8:24 AM  Advanced Heart Failure Team Pager [phone] (M-F; 7a - 5p)  Please contact [facility] for night-coverage after hours (5p -7a ) and weekends on [email or web address]

## 2023-04-19 NOTE — CV Procedure (Signed)
   TRANSESOPHAGEAL ECHOCARDIOGRAM GUIDED DIRECT CURRENT CARDIOVERSION  NAME:  Brandon [name]    MRN: [id number] DOB:  [birth year]    ADMIT DATE: 04/15/2023  INDICATIONS: Symptomatic atrial fibrillation  PROCEDURE:   Informed consent was obtained prior to the procedure. The risks, benefits and alternatives for the procedure were discussed and the patient comprehended these risks.  Risks include, but are not limited to, cough, sore throat, vomiting, nausea, somnolence, esophageal and stomach trauma or perforation, bleeding, low blood pressure, aspiration, pneumonia, infection, trauma to the teeth and death.    After a procedural time-out, the oropharynx was anesthetized and the patient was sedated by the anesthesia service. The transesophageal probe was inserted in the esophagus and stomach without difficulty and multiple views were obtained. Sedation by anesthesia.  COMPLICATIONS:    Complications: No complications Patient tolerated procedure well.  KEY FINDINGS:  LV: Severely dilated LV with severely reduced function RV: mild to moderately reduced function LAA: no thrombus. Full Report to follow.   CARDIOVERSION:     Indications:  Symptomatic Atrial Fibrillation  Procedure Details:  Once the TEE was complete, the patient had the defibrillator pads placed in the anterior and posterior position. Once an appropriate level of sedation was confirmed, the patient was cardioverted x 1 with 200J of biphasic synchronized energy.  The patient converted to NSR briefly and then back into atrial fibrillation.  He was shocked a second time at 300J with conversion to NSR.  There were no apparent complications.  The patient had normal neuro status and respiratory status post procedure with vitals stable as recorded elsewhere.  Adequate airway was maintained throughout and vital signs monitored per protocol.  Shawanna Zanders Advanced Heart Failure 3:52 PM

## 2023-04-19 NOTE — Progress Notes (Signed)
 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     Subjective:    Feels well this morning. No current complaints. Denies CP. Dyspnea much improved.   He has diuresed well w/ milrinone support. Overall down 20 lb since admit. CVP low ~2. Scr improving, down from peak of 3.5>>3.1 today. Milrinone stopped at 0700 this morning. Co-ox stable 68%.   Remains in Afib, V-rates 90s-low 100s.  K 3.8 Mg 2.3   Objective:   Weight Range: 80.6 kg Body mass index is 24.78 kg/m.   Vital Signs:   Temp:  [96.7 F (35.9 C)-97.9 F (36.6 C)] 97.9 F (36.6 C) (02/03 2000) Pulse Rate:  [67-146] 100 (02/04 0800) Resp:  [13-24] 16 (02/04 0800) BP: (91-118)/(67-94) 104/83 (02/04 0600) SpO2:  [98 %-100 %] 100 % (02/04 0800) Arterial Line BP: (98-121)/(47-75) 121/75 (02/04 0800) Weight:  [80.6 kg] 80.6 kg (02/04 0500) Last BM Date : 04/17/23  Weight change: Filed Weights   04/17/23 0500 04/18/23 0459 04/19/23 0500  Weight: 88.8 kg 82 kg 80.6 kg    Intake/Output:   Intake/Output Summary (Last 24 hours) at 04/19/2023 0824 Last data filed at 04/19/2023 0800 Gross per 24 hour  Intake 1079.64 ml  Output 5850 ml  Net -4770.36 ml      Physical Exam    CVP 3  General: thin/cathectic appearing elderly male, no distress/respiratory difficulty HEENT: normal Neck: supple. JVD not elevated Carotids 2+ bilat; no bruits. No lymphadenopathy or thyromegaly appreciated. Cor: PMI nondisplaced. Irregularly rhythm and rate. No rubs, gallops or murmurs. Lungs: decreased BS at the bases bilaterally, no wheezing  Abdomen: soft, nontender, nondistended. No hepatosplenomegaly. No bruits or masses. Good bowel sounds. Extremities: no cyanosis, clubbing,  rash, no edema +SCDs +RUE PICC Neuro: alert & oriented x 3, cranial nerves grossly intact. moves all 4 extremities w/o difficulty. Affect pleasant.    Telemetry   Atrial fibrillation, 90s-low 100s, personally reviewed   Labs    CBC Recent Labs    04/16/23 1715 04/19/23 0358  WBC  --  8.2  HGB 11.2* 11.9*  HCT 33.0* 35.6*  MCV  --  94.7  PLT  --  143*   Basic Metabolic Panel Recent Labs    16/10/96 0445 04/18/23 1517 04/19/23 0358  NA 132* 132* 128*  K 4.0 4.3 3.8  CL 91* 90* 83*  CO2 28 26 28   GLUCOSE 121* 137* 322*  BUN 74* 78* 75*  CREATININE 3.29* 3.26* 3.12*  CALCIUM 9.5 9.5 9.1  MG 2.7*  --  2.3   Liver Function Tests Recent Labs    04/18/23 0445  AST 191*  ALT 1,001*  ALKPHOS 128*  BILITOT 0.9  PROT 6.1*  ALBUMIN 3.3*   No results for input(s): "LIPASE", "AMYLASE" in the last 72 hours. Cardiac Enzymes No results for input(s): "CKTOTAL", "CKMB", "CKMBINDEX", "TROPONINI" in the last 72 hours.  BNP: BNP (last 3 results) Recent Labs    04/15/23 1627  BNP 2,612.3*    ProBNP (last 3 results) No results for input(s): "PROBNP" in the last 8760 hours.   D-Dimer No results for input(s): "DDIMER" in the last 72 hours. Hemoglobin A1C No results for input(s): "HGBA1C" in the last 72  hours. Fasting Lipid Panel No results for input(s): "CHOL", "HDL", "LDLCALC", "TRIG", "CHOLHDL", "LDLDIRECT" in the last 72 hours.  Thyroid Function Tests No results for input(s): "TSH", "T4TOTAL", "T3FREE", "THYROIDAB" in the last 72 hours.  Invalid input(s): "FREET3"  Other results:   Medications:     Scheduled Medications:  apixaban  2.5 mg Oral BID   Chlorhexidine Gluconate Cloth  6 each Topical Daily   feeding supplement  237 mL Oral BID BM   gabapentin  300 mg Oral QHS   mometasone-formoterol  2 puff Inhalation BID   montelukast  10 mg Oral QHS   sodium chloride flush  10-40 mL Intracatheter Q12H   sodium chloride flush  3-10 mL Intravenous Q12H     Infusions:  amiodarone 60 mg/hr (04/19/23 0800)    PRN Medications: acetaminophen, ondansetron (ZOFRAN) IV, mouth rinse, sodium chloride flush, sodium chloride flush    Assessment/Plan   Acute on Chronic Systolic Heart Failure>>Cardiogenic shock: Presented with elevated lactate, hepatic congestion, borderline BP in the setting of recurrent atrial fibrillation. ACC/AHA Stage D end stage cardiomyopathy. Not an advanced therapies candidate given age. Using milrinone and lasix gtt for palliative diuresis.  - He has diuresed well w/ milrinone support. Overall down 20 lb since admit. CVP low ~2. Scr improving, down from peak of 3.5>>3.1 today. Co-ox 68% - We will stop lasix gtt and plan transition to back to torsemide tomorrow, on higher dose at 60 mg daily (on 40 daily PTA) - Stop milrinone  - plan TEE/DCCV today - GDMT limited by CKD and soft BP   Persistent Atrial fibrillation: Unclear if primary cause or secondary to his decompensation. Has missed one dose of OAC in the last month  - plan TEE/DCCV today  - continue amio gtt at 60/hr and transition to PO post cardioversion  - give K supp (3.8)   Shock liver: Elevated on arrival, secondary to congestion and shock.  - GS improved and diuresed 20 lb - will repeat HFTs today (pending)   AKI on CKD IIIb: Secondary to cardiorenal syndrome and shock. - B/l Scr ~1.2  - Scr peaked 3.5 this admit - Scr improving, 3.1 today - follow CMP  - holding SGLT2i, may be able to restart if SCr continues to improve   COPD:  - Home inhalers  HLD: - Holding home statin with LFT increase - repeat HFTs pending    CRITICAL CARE Performed by: Robbie Lis, PA-C    Total critical care time: 20 minutes  Critical care time was exclusive of separately billable procedures and treating other patients.  Critical care was necessary to treat or prevent imminent or life-threatening deterioration.  Critical care was time spent personally by  me on the following activities: development of treatment plan with patient and/or surrogate as well as nursing, discussions with consultants, evaluation of patient's response to treatment, examination of patient, obtaining history from patient or surrogate, ordering and performing treatments and interventions, ordering and review of laboratory studies, ordering and review of radiographic studies, pulse oximetry and re-evaluation of patient's condition.    Length of Stay: [address] PA-C  04/19/2023, 8:24 AM  Advanced Heart Failure Team Pager [phone] (M-F; 7a - 5p)  Please contact [facility] for night-coverage after hours (5p -7a ) and weekends on [email or web address]

## 2023-04-20 ENCOUNTER — Encounter (HOSPITAL_COMMUNITY): Payer: Self-pay | Admitting: Cardiology

## 2023-04-20 DIAGNOSIS — Z7189 Other specified counseling: Secondary | ICD-10-CM | POA: Diagnosis not present

## 2023-04-20 DIAGNOSIS — Z515 Encounter for palliative care: Secondary | ICD-10-CM

## 2023-04-20 DIAGNOSIS — Z66 Do not resuscitate: Secondary | ICD-10-CM

## 2023-04-20 DIAGNOSIS — I4891 Unspecified atrial fibrillation: Secondary | ICD-10-CM | POA: Diagnosis not present

## 2023-04-20 LAB — COOXEMETRY PANEL
Carboxyhemoglobin: 0.8 % (ref 0.5–1.5)
Methemoglobin: <0.7 % (ref 0.0–1.5)
O2 Saturation: 74.9 %
Total hemoglobin: 12.6 g/dL (ref 12.0–16.0)

## 2023-04-20 LAB — BASIC METABOLIC PANEL
Anion gap: 18 — ABNORMAL HIGH (ref 5–15)
BUN: 77 mg/dL — ABNORMAL HIGH (ref 8–23)
CO2: 29 mmol/L (ref 22–32)
Calcium: 9.3 mg/dL (ref 8.9–10.3)
Chloride: 81 mmol/L — ABNORMAL LOW (ref 98–111)
Creatinine, Ser: 3.19 mg/dL — ABNORMAL HIGH (ref 0.61–1.24)
GFR, Estimated: 19 mL/min — ABNORMAL LOW (ref >60)
Glucose, Bld: 285 mg/dL — ABNORMAL HIGH (ref 70–99)
Potassium: 3.6 mmol/L (ref 3.5–5.1)
Sodium: 128 mmol/L — ABNORMAL LOW (ref 135–145)

## 2023-04-20 LAB — MAGNESIUM: Magnesium: 2.1 mg/dL (ref 1.7–2.4)

## 2023-04-20 MED ORDER — TORSEMIDE 20 MG PO TABS: 60.0000 mg | ORAL_TABLET | Freq: Every day | ORAL | Status: DC

## 2023-04-20 MED ORDER — POTASSIUM CHLORIDE CRYS ER 20 MEQ PO TBCR
40.0000 meq | EXTENDED_RELEASE_TABLET | Freq: Once | ORAL | Status: AC
  Administered 2023-04-20: 40 meq via ORAL
  Filled 2023-04-20: qty 2

## 2023-04-20 MED ORDER — TORSEMIDE 20 MG PO TABS
40.0000 mg | ORAL_TABLET | Freq: Every day | ORAL | Status: DC
Start: 2023-04-20 — End: 2023-04-21
  Administered 2023-04-20: 40 mg via ORAL
  Filled 2023-04-20: qty 2

## 2023-04-20 NOTE — Progress Notes (Addendum)
 This chaplain responded to the RN's consult for creating/updating the Pt. Advance Directive-HCPOA. The Pt. is not completing a Living Will.   The Pt. is awake and participated in AD education with the chaplain. The Pt. daughter-Keisha Georgina is at the bedside. The Pt. daughter-Robin Ramonita joined the visit by cell phone. The Pt. was able to answer clarifying questions after AD education.  The Pt. chose [name] Georgina as his healthcare agent. If this person is unable or unwilling to serve in this role, the Pt. next choice is Grayce Ramonita. Notary and witnesses are not available for notarizing the Pt. AD.   This chaplain placed the Pt. incomplete document in the Pt. chart. Notarized documents will be placed in the Pt. chart for family pickup.  This chaplain will F/U on notarizing the Pt. AD on Thursday.  Chaplain Leeroy Hummer [phone]

## 2023-04-20 NOTE — Consult Note (Signed)
 Palliative Care Consult Note                                  Date: 04/20/2023   Patient Name: Brandon [name]  DOB: 10/02/1940  MRN: [id number]  Age / Sex: 83 y.o., male  PCP: Daryl Setter, NP Referring Physician: Zenaida Morene PARAS, MD  Reason for Consultation: Establishing goals of care  HPI/Patient Profile: 83 y.o. 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 He presented from primary cardiologist office on recommendation due to shortness of breath.  He was admitted on 04/15/2023 with persistent atrial fibrillation (now status post DCCV after TEE), nonischemic cardiomyopathy, end-stage heart failure, group 2 pulmonary hypertension, AKI on CKD, shock liver, and others.   Palliative medicine was consulted for GOC conversations.  Past Medical History:  Diagnosis Date   Anemia    Asthma    Hx of childhood asthma, disappeared for a while, then resurfaced 6-7 years ago.    Cardiomyopathy    with a negative cardiac catheterization in the past. 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     Subjective:   This NP Camellia Kays reviewed medical records, received report from team, assessed the patient and then meet at the patient's bedside to discuss diagnosis, prognosis, GOC, EOL wishes disposition and options.  I met with patient at the bedside.  Toward the end of my visit the patient's daughter Brandon [name] came to the room.   We meet to discuss diagnosis prognosis, GOC, EOL wishes, disposition and options. Concept of Palliative Care was introduced as specialized medical care for people and their families living with serious illness.  If focuses on providing  relief from the symptoms and stress of a serious illness.  The goal is to improve quality of life for both the patient and the family. Values and goals of care important to patient and family were attempted to be elicited.  Created space and opportunity for patient  and family to explore thoughts and feelings regarding current medical situation   Natural trajectory and current clinical status were discussed. Questions and concerns addressed. Patient  encouraged to call with questions or concerns.    Patient/Family Understanding of Illness: Patient and daughter Brandon [name] both have a good understanding of the patient's current clinical situation.  He notes his heart is weak, this is a second episode of A-fib now status post cardioversion.  He notes he had a lot of fluid but the diuretics have helped.  He notes his overall heart is weak, weaker than it has been and he is on medications to support.  Understanding is end-stage heart failure not a candidate for advanced therapies.  I understand he failed his milrinone  wean yesterday.  If he fails he has been tomorrow then we likely have limited options on how to move forward.  We spent a significant amount of time discussing his current clinical situation.  Life Review: The patient is currently married, which is a second marriage, and has been married for 22 years.  He has an older daughter Brandon [name] who lives in  [place], younger daughter Brandon [name] who lives in [place], and a son Brandon [name] who lives in [place].  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  He was born in [place] but lived in [place]  in [place] before returning to [place] about 20 years ago.  He was an outgoing individual, played sports in college and played basketball overseas.  He went to college at [employer] in [place], [place] where he majoring sociology and psychology.  He is a religious/spiritual individual and practices in the  Protestant faith.  I offered spiritual care support anytime that he is here.  Patient Values: Family, faith  Goals: To try to get better if possible.  However, he states that he is at peace with how sick he is.  He seems to understand that he may not recover.  Today's Discussion: In addition to discussions described above we had extensive discussion of various topics.  We discussed clinical situation in detail.  We discussed a life review as described above.  He understands that he is at a very peculiar situation where he is not a candidate for advanced therapies.  He previously failed milrinone  wean and they are planning to try again tomorrow.  If he again fails milrinone  then there are limited to possibly no options on how to proceed.  At this point it seems that the care team is recommending comfort care/hospice care.  As I explained to the patient, if he fails his milrinone  wean then we may be short on options on how to cure him.  However, if we cannot cure him we can still care for him and we would have to have further discussions on how to do this.  We spent some time discussing HCPOA and living will documentation with both the patient and his daughter Brandon [name].  I reviewed the packet with them and did some education in anticipation of the chaplain coming to do the same as well as help with signing and notarized and to make official.  The patient is clear he would like his youngest daughter Brandon [name] to be his primary HCPOA.  I shared with the patient and family that I would follow-up tomorrow to see how he is doing.  Further discussions will occur as needed. I provided emotional and general support through therapeutic listening, empathy, sharing of stories, therapeutic touch, and other techniques. I answered all questions and addressed all concerns to the best of my ability.  Review of Systems  Constitutional:  Negative for appetite change (Appetite is good).       Feels well overall   Respiratory:  Negative for shortness of breath (significantly improved in NSR and on milrinone ).   Cardiovascular:  Negative for chest pain.  Gastrointestinal:  Negative for abdominal pain, nausea and vomiting.    Objective:   Primary Diagnoses: Present on Admission:  Persistent atrial fibrillation (HCC)  Atrial fibrillation with RVR (HCC)   Physical Exam Vitals and nursing note reviewed.  Constitutional:      General: He is not in acute distress.    Appearance: He is ill-appearing.  HENT:     Head: Normocephalic and atraumatic.  Cardiovascular:     Rate and Rhythm: Normal rate and regular rhythm.  Pulmonary:     Effort: Pulmonary effort is normal. No respiratory distress.     Breath sounds: No wheezing or rhonchi.  Abdominal:     General: Abdomen is flat. Bowel sounds are normal. There is no distension.  Palpations: Abdomen is soft.  Skin:    General: Skin is warm and dry.  Neurological:     General: No focal deficit present.     Mental Status: He is alert.  Psychiatric:        Mood and Affect: Mood normal.        Behavior: Behavior normal.     Vital Signs:  BP 91/71   Pulse 65   Temp 97.9 F (36.6 C) (Oral)   Resp 16   Ht 5' 11 (1.803 m)   Wt 77.9 kg   SpO2 100%   BMI 23.95 kg/m   Palliative Assessment/Data: 50%    Advanced Care Planning:   Existing Vynca/ACP Documentation: None  Primary Decision Maker: PATIENT  Code Status/Advance Care Planning: DNR-interventions desired  A discussion was had today regarding advanced directives. Concepts specific to code status, artifical feeding and hydration, continued IV antibiotics and rehospitalization was had.  The difference between a aggressive medical intervention path and a palliative comfort care path for this patient at this time was had.   Decisions/Changes to ACP: None today  Assessment & Plan:   Impression: 83 year old male with acute presentation chronic comorbidities as described  above.  Patient and daughter Brandon [name] are very well aware of his current clinical situation and the predicament this process.  They understand we will attempt milrinone  wean again tomorrow and if he does not do well we will not have conversations on how to proceed.  Overall patient feels well today likely because he is back in NSR and on milrinone .  Further GOC conversations as needed.  Overall long-term prognosis poor  SUMMARY OF RECOMMENDATIONS   DNR-interventions desired Continue full scope of care otherwise Anticipate attempted milrinone  wean tomorrow Time for outcomes Palliative medicine will follow-up tomorrow to see how he does off milrinone  Further conversations depending on evolution of his clinical picture Appreciate chaplain and social work support and attempted to complete HCPOA documentation  Symptom Management:  Per primary team PMT is available to assist as needed  Prognosis:  Unable to determine  Discharge Planning:  To Be Determined   Discussed with: Patient, family, medical team, nursing team, spiritual care team    Thank you for allowing us  to participate in the care of Keng E Pettie PMT will continue to support holistically.  Time Total: 82 min  Detailed review of medical records (labs, imaging, vital signs), medically appropriate exam, discussed with treatment team, counseling and education to patient, family, & staff, documenting clinical information, medication management, coordination of care  Signed by: Camellia Kays, NP Palliative Medicine Team  Team Phone # [phone] (Nights/Weekends)  04/20/2023, 2:42 PM

## 2023-04-20 NOTE — Progress Notes (Addendum)
 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      Interval Hx:    Diuresed 20+ lb w/ lasix  gtt, supported w/ milrinone   2/5 Milrinone  and lasix  gtt discontinued. TEE/DCCV EF 10-15%, Cardioverted x 2>>NSR 2/5 Failed Milrinone  wean post cardioversion, Co-ox dropped to 40s>>0.125 of milrinone  restarted    Subjective:    Remains on milrinone  0.125 and amio gtt at 60/hr. Off diuretics  Co-ox improved, 75% today. Maintaining NSR. 2L in UOP yesterday and wt continues to trend down. CVP 5. Scr remains elevated from baseline but improved since admit, peak of 3.5>>3.19 today and stable past 24 hrs.   K 3.6 Mg 2.1  Sitting up in bed and just finished breakfast. Appetite is great. Ate all of his food. Denies dyspnea. No CP. Resting comfortably.   Objective:   Weight Range: 77.9 kg Body mass index is 23.95 kg/m.   Vital Signs:   Temp:  [97.6 F (36.4 C)-98.9 F (37.2 C)] 97.6 F (36.4 C) (02/05 0731) Pulse Rate:  [60-119] 73 (02/05 0700) Resp:  [7-27] 19 (02/05 0700) BP: (82-107)/(61-84) 96/79 (02/05 0630) SpO2:  [92 %-100 %] 100 % (02/05 0700) Arterial Line BP: (79-124)/(48-75) 115/63 (02/05 0700) Weight:  [77.9 kg] 77.9 kg (02/05 0500) Last BM Date : 04/19/23  Weight change: Filed Weights   04/18/23 0459 04/19/23 0500 04/20/23 0500  Weight: 82 kg 80.6 kg 77.9 kg    Intake/Output:   Intake/Output Summary (Last 24 hours) at 04/20/2023 0753 Last data filed at 04/20/2023 0700 Gross per 24 hour  Intake 911.45 ml  Output 2050 ml  Net -1138.55 ml      Physical Exam    CVP 5 General:  thin/cachetic appearing elderly AAM, sitting up in bed. No respiratory difficulty HEENT: normal Neck: supple. JVD not elevated.  Carotids 2+ bilat; no bruits. No lymphadenopathy or thyromegaly appreciated. Cor: PMI nondisplaced. Regular rate & rhythm. No rubs, gallops or murmurs. Lungs: decreased BS at the bases bilaterally, no crackles  Abdomen: soft, nontender, nondistended. No hepatosplenomegaly. No bruits or masses. Good bowel sounds. Extremities: no cyanosis, clubbing, rash, edema + RUE PICC, +b/l SCDs  Neuro: alert & oriented x 3, cranial nerves grossly intact. moves all 4 extremities w/o difficulty. Affect pleasant.    Telemetry   NSR w/ PACs 80s personally reviewed    Labs    CBC Recent Labs    04/19/23 0358  WBC 8.2  HGB 11.9*  HCT 35.6*  MCV 94.7  PLT 143*   Basic Metabolic Panel Recent Labs    97/95/74 0358 04/20/23 0452  NA 128* 128*  K 3.8 3.6  CL 83* 81*  CO2 28 29  GLUCOSE 322* 285*  BUN 75* 77*  CREATININE 3.12* 3.19*  CALCIUM  9.1 9.3  MG 2.3 2.1   Liver Function Tests Recent Labs    04/18/23 0445 04/19/23 0357  AST 191* 97*  ALT 1,001* 688*  ALKPHOS 128* 113  BILITOT 0.9 1.1  PROT 6.1* 5.9*  ALBUMIN 3.3* 3.1*   No results for input(s): LIPASE, AMYLASE in the last 72 hours. Cardiac Enzymes No results for input(s): CKTOTAL, CKMB, CKMBINDEX, TROPONINI in the last 72 hours.  BNP: BNP (last 3 results)  Recent Labs    04/15/23 1627  BNP 2,612.3*    ProBNP (last 3 results) No results for input(s): PROBNP in the last 8760 hours.   D-Dimer No results for input(s): DDIMER in the last 72 hours. Hemoglobin A1C No results for input(s): HGBA1C in the last 72 hours. Fasting Lipid Panel No results for input(s): CHOL, HDL, LDLCALC, TRIG, CHOLHDL, LDLDIRECT in the last 72 hours.  Thyroid  Function Tests No results for input(s): TSH, T4TOTAL, T3FREE, THYROIDAB in the last 72 hours.  Invalid input(s): FREET3  Other results:   Medications:     Scheduled Medications:  apixaban   2.5 mg Oral BID   Chlorhexidine  Gluconate  Cloth  6 each Topical Daily   feeding supplement  237 mL Oral BID BM   gabapentin   300 mg Oral QHS   mometasone -formoterol   2 puff Inhalation BID   montelukast   10 mg Oral QHS   potassium chloride   40 mEq Oral Once   sodium chloride  flush  10-40 mL Intracatheter Q12H    Infusions:  amiodarone  60 mg/hr (04/20/23 0700)   milrinone  0.125 mcg/kg/min (04/20/23 0700)    PRN Medications: acetaminophen , ondansetron  (ZOFRAN ) IV, mouth rinse, sodium chloride  flush    Assessment/Plan    Cardiogenic shock: Presented with elevated lactate, hepatic congestion, borderline BP in the setting of recurrent atrial fibrillation. ACC/AHA Stage D end stage cardiomyopathy. Not an advanced therapies candidate given age. Placed on milrinone  and lasix  gtt for palliative diuresis, responding well to therapies. Has diuresed 26 lb, now off lasix  gtt w/ CVP of 5. S/p successful DCCV 2/4 and maintaining NSR but failed milrinone  wean. Co-ox post cardioversion 48%. Now back on low dose milrinone  0.125 w/ improvement in co-ox to 75% today  - will continue low dose milrinone  today and plan to stop again tomorrow. If fails 2nd wean, would recommend transition to comfort/hospice care. Will d/w wife and place palliative care consult for GOC discussion  - transition to PO diuretics today, torsemide  40 mg daily  - Holding home jardiance  currently, restart as renal function improves - other GDMT limited by CKD and soft BP    Atrial fibrillation: Unclear if primary cause or secondary to his decompensation. S/p DCCV 2/4, took 2 attempts. Maintaining NSR.  - continue amio gtt until off milrinone . Will keep at 60 mg/hr  - Eliquis  2.5 mg bid - give K supp (3.6)  Shock liver: Elevated on arrival, secondary to congestion and shock.  - down-trending w/ treatment of shock  - continue milrinone  per above  - follow daily CMP   AKI on CKD IIIb: Secondary to cardiorenal syndrome and shock. - b/l SCr 2.7 - SCr improving/stable,  3.5>>3.2>3.2. UOP good  - milrinone  per above - start PO torsemide  and follow urinary response  - follow daily CMP   Hyperkalemia:  - resolved, K 3.6 today>>keep closer to 4.0  - give K supp, d/w pharmD   COPD:  - Home inhalers  HLD: - Holding home statin with LFT increase   Ok to transfer out to floor today. AHF team will continue to follow.    Length of Stay: [address] PA-C  04/20/2023, 7:53 AM  Advanced Heart Failure Team Pager [phone] (M-F; 7a - 5p)  Please contact [facility] for night-coverage after hours (5p -7a ) and weekends on [email or web address]

## 2023-04-20 NOTE — TOC Progression Note (Addendum)
 Transition of Care [facility]) - Progression Note    Patient Details  Name: Brandon [name] MRN: [id number] Date of Birth: 1940/12/01  Transition of [facility]) CM/SW Contact  Arlana JINNY Nicholaus ISRA[place] Phone Number: [phone] 04/20/2023, 10:48 AM  Clinical Narrative:  10:22 AM- HF CSW called and spoke with pts daughter, [name] [phone]. Pts daughter stated that she needed assistance with getting pts POA paperwork signed. CSW explained the POA process.   10:50 AM- CSW met with the chaplain to inquire about healthcare POA documentation. CSW got the POA documentation and will leave at pts bedside for daughter to meet with pt to complete together.   10:52 AM- CSW called and spoke with daughter over the phone. Pts daughter asked for documents to be left at bedside and stated that she was driving from about 2 hours away. CSW messaged nurse to see if a chaplain consult can be placed.    CSW met with pt at bedside and explained the healthcare POA paperwork process to pt. CSW explained that a chaplain consult was placed by nurse, so one would arrive to the room shortly to assist with completing the paperwork. Pt stated that he understood.   TOC will continue following.     Expected Discharge Plan: Home/Self Care Barriers to Discharge: Continued Medical Work up  Expected Discharge Plan and Services   Discharge Planning Services: CM Consult   Living arrangements for the past 2 months: Single Family Home                                       Social Determinants of Health (SDOH) Interventions SDOH Screenings   Food Insecurity: No Food Insecurity (04/16/2023)  Housing: Low Risk  (04/17/2023)  Transportation Needs: No Transportation Needs (04/16/2023)  Utilities: Not At Risk (04/16/2023)  Alcohol  Screen: Low Risk  (07/01/2022)  Depression (PHQ2-9): Low Risk  (02/03/2023)  Financial Resource Strain: Low Risk  (01/13/2023)  Physical Activity: Insufficiently Active (07/25/2020)  Social  Connections: Socially Integrated (04/16/2023)  Stress: No Stress Concern Present (05/28/2021)  Tobacco Use: Medium Risk (04/19/2023)  Health Literacy: Adequate Health Literacy (01/27/2023)    Readmission Risk Interventions     No data to display

## 2023-04-21 DIAGNOSIS — R570 Cardiogenic shock: Secondary | ICD-10-CM | POA: Diagnosis not present

## 2023-04-21 DIAGNOSIS — Z515 Encounter for palliative care: Secondary | ICD-10-CM | POA: Diagnosis not present

## 2023-04-21 DIAGNOSIS — Z7189 Other specified counseling: Secondary | ICD-10-CM | POA: Diagnosis not present

## 2023-04-21 DIAGNOSIS — Z66 Do not resuscitate: Secondary | ICD-10-CM | POA: Diagnosis not present

## 2023-04-21 DIAGNOSIS — I4891 Unspecified atrial fibrillation: Secondary | ICD-10-CM | POA: Diagnosis not present

## 2023-04-21 LAB — BASIC METABOLIC PANEL
Anion gap: 14 (ref 5–15)
BUN: 88 mg/dL — ABNORMAL HIGH (ref 8–23)
CO2: 28 mmol/L (ref 22–32)
Calcium: 9.5 mg/dL (ref 8.9–10.3)
Chloride: 88 mmol/L — ABNORMAL LOW (ref 98–111)
Creatinine, Ser: 3.59 mg/dL — ABNORMAL HIGH (ref 0.61–1.24)
GFR, Estimated: 16 mL/min — ABNORMAL LOW (ref >60)
Glucose, Bld: 122 mg/dL — ABNORMAL HIGH (ref 70–99)
Potassium: 4.1 mmol/L (ref 3.5–5.1)
Sodium: 130 mmol/L — ABNORMAL LOW (ref 135–145)

## 2023-04-21 LAB — MAGNESIUM: Magnesium: 2.2 mg/dL (ref 1.7–2.4)

## 2023-04-21 LAB — COOXEMETRY PANEL
Carboxyhemoglobin: 0.7 % (ref 0.5–1.5)
Methemoglobin: <0.7 % (ref 0.0–1.5)
O2 Saturation: 69.7 %
Total hemoglobin: 12.9 g/dL (ref 12.0–16.0)

## 2023-04-21 NOTE — Plan of Care (Signed)
  Problem: Education: Goal: Knowledge of disease or condition will improve Outcome: Progressing   Problem: Education: Goal: Knowledge of General Education information will improve Description: Including pain rating scale, medication(s)/side effects and non-pharmacologic comfort measures Outcome: Progressing   Problem: Clinical Measurements: Goal: Respiratory complications will improve Outcome: Progressing

## 2023-04-21 NOTE — Progress Notes (Signed)
 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     Subjective:     2/5 Milrinone  and lasix  gtt discontinued. TEE/DCCV EF 10-15%, Cardioverted x 2>>NSR. Failed Milrinone  wean, 0.125 of milrinone  restarted   Remains on 0.125 milrinone . CO-OX 70%.   Scr trending up, 3.2>3.6. On po Torsemide .   SBP soft, 90s-100s.   Denies dyspnea, orthopnea and PND. Appetite better, ate all of his breakfast.  Objective:   Weight Range: 77.4 kg Body mass index is 23.8 kg/m.   Vital Signs:   Temp:  [97.6 F (36.4 C)-98.8 F (37.1 C)] 98.4 F (36.9 C) (02/06 0706) Pulse Rate:  [63-121] 70 (02/06 0448) Resp:  [12-25] 18 (02/06 0706) BP: (89-111)/(62-79) 98/70 (02/06 0448) SpO2:  [81 %-100 %] 100 % (02/06 0448) Arterial Line BP: (92-123)/(58-76) 92/63 (02/05 0831) Weight:  [77.4 kg] 77.4 kg (02/06 0448) Last BM Date : 04/19/23  Weight change: Filed Weights   04/19/23 0500 04/20/23 0500 04/21/23 0448  Weight: 80.6 kg 77.9 kg 77.4 kg    Intake/Output:   Intake/Output Summary (Last 24 hours) at 04/21/2023 0731 Last data filed at 04/21/2023 0449 Gross per 24 hour  Intake 1696.78 ml  Output 1900 ml  Net -203.22 ml      Physical Exam    CVP 3 General:  Thin, frail elderly male Neck: no JVD Cor: Regular rate & rhythm. No rubs, gallops or murmurs. Lungs: clear Abdomen: soft, nontender, nondistended.  Extremities: no cyanosis, clubbing, rash, edema, RUE PICC Neuro: alert & orientedx3. Affect pleasant   Telemetry   NSR 70s currently, in and out of AF overnight   Labs    CBC Recent Labs    04/19/23 0358  WBC 8.2  HGB 11.9*  HCT 35.6*  MCV 94.7  PLT 143*   Basic Metabolic Panel Recent Labs     04/20/23 0452 04/21/23 0228  NA 128* 130*  K 3.6 4.1  CL 81* 88*  CO2 29 28  GLUCOSE 285* 122*  BUN 77* 88*  CREATININE 3.19* 3.59*  CALCIUM  9.3 9.5  MG 2.1 2.2   Liver Function Tests Recent Labs    04/19/23 0357  AST 97*  ALT 688*  ALKPHOS 113  BILITOT 1.1  PROT 5.9*  ALBUMIN 3.1*   No results for input(s): LIPASE, AMYLASE in the last 72 hours. Cardiac Enzymes No results for input(s): CKTOTAL, CKMB, CKMBINDEX, TROPONINI in the last 72 hours.  BNP: BNP (last 3 results) Recent Labs    04/15/23 1627  BNP 2,612.3*    ProBNP (last 3 results) No results for input(s): PROBNP in the last 8760 hours.   D-Dimer No results for input(s): DDIMER in the last 72 hours. Hemoglobin A1C No results for input(s): HGBA1C in the last 72 hours. Fasting Lipid Panel No results for input(s): CHOL, HDL, LDLCALC, TRIG, CHOLHDL, LDLDIRECT in the last 72 hours.  Thyroid  Function Tests No results for input(s): TSH, T4TOTAL, T3FREE, THYROIDAB in the last 72 hours.  Invalid input(s): FREET3  Other results:   Medications:     Scheduled Medications:  apixaban   2.5 mg Oral BID   Chlorhexidine  Gluconate Cloth  6 each Topical Daily   feeding supplement  237 mL  Oral BID BM   gabapentin   300 mg Oral QHS   mometasone -formoterol   2 puff Inhalation BID   montelukast   10 mg Oral QHS   sodium chloride  flush  10-40 mL Intracatheter Q12H   torsemide   40 mg Oral Daily    Infusions:  amiodarone  60 mg/hr (04/21/23 0445)   milrinone  0.125 mcg/kg/min (04/21/23 0034)    PRN Medications: acetaminophen , ondansetron  (ZOFRAN ) IV, mouth rinse, sodium chloride  flush    Assessment/Plan    Cardiogenic shock: Presented with elevated lactate, hepatic congestion, borderline BP in the setting of recurrent atrial fibrillation. ACC/AHA Stage D end stage cardiomyopathy. Not an advanced therapies candidate given age. Placed on milrinone  and lasix  gtt for  palliative diuresis. S/p successful DCCV 2/4 and maintaining NSR but failed milrinone  wean. Co-ox post cardioversion 48%. Now back on low dose milrinone  0.125. - Stop milrinone  today. If fails wean, will need to transition comfort/hospice care - CVP 3. Scr up 3.2>3.6 today. Has diuresed 27 lb. Hold po Torsemide . - Holding home jardiance  currently, restart if/when renal function improves - other GDMT limited by CKD and soft BP   Atrial fibrillation: Unclear if primary cause or secondary to his decompensation. S/p DCCV 2/4, took 2 attempts. Maintaining NSR.  - In and out of AF overnight. Continue IV amiodarone  today, plan to transition to po tomorrow - Eliquis  2.5 mg bid  Shock liver: LFTs elevated on arrival, secondary to congestion and shock.  - improving with trreatment of shock and diuresis  AKI on CKD IIIb: Secondary to cardiorenal syndrome and shock. - b/l SCr 2.7 - SCr 3.2>3.6 today as above. Holding diuretics. - Follow closely  Hyperkalemia:  - Resolved  COPD:  - Home inhalers  HLD: - Holding home statin with shock liver  GOC: He has end-stage HF. Long-term prognosis is poor. Failed initial milrinone  wean. Reattempting wean today. Hospice would be reasonable at discharge.  -Code status now DNR (would desire intubation) -Appreciate Palliative Care   Length of Stay: 6  [facility], [place] Dunnigan N, PA-C  04/21/2023, 7:31 AM  Advanced Heart Failure Team Pager [phone] (M-F; 7a - 5p)  Please contact [facility] for night-coverage after hours (5p -7a ) and weekends on [email or web address]

## 2023-04-21 NOTE — Progress Notes (Addendum)
 This chaplain is present for F/U spiritual care in the setting of notarizing the Pt. Advance Directive:  HCPOA only. The Pt. did not complete a Living Will.  The chaplain reviewed AD education with the Pt.. The Pt.  answered clarifying questions before calling the notary..The chaplain is present with the Pt., notary, and witnesses for the notarizing of Pt. HCPOA.   The Pt. named [name] Ada as his healthcare agent. If this person is unable or unwilling to serve in this role, the Pt. next choice is Grayce Griffiths.  The chaplain gave the Pt. the original AD along with two copies. The chaplain scanned the Pt. AD into the Pt. EMR. Per the Pt. daughter-Keisha request, the chaplain left the Pt. AD documents in the Pt. personal bag in the room.  This chaplain is available for F/U spiritual care as needed.  Chaplain Leeroy hummer [phone]

## 2023-04-21 NOTE — Progress Notes (Signed)
 Daily Progress Note   Patient Name: Brandon [name]       Date: 04/21/2023 DOB: 04/29/1940  Age: 83 y.o. MRN#: [id number] Attending Physician: Brandon Morene PARAS, MD Primary Care Physician: Brandon Setter, NP Admit Date: 04/15/2023 Length of Stay: 6 days  Reason for Consultation/Follow-up: Establishing goals of care  HPI/Patient Profile:  83 y.o. 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 He presented from primary cardiologist office on recommendation due to shortness of breath.  He was admitted on 04/15/2023 with persistent atrial fibrillation (now status post DCCV after TEE), nonischemic cardiomyopathy, end-stage heart failure, group 2 pulmonary hypertension, AKI on CKD, shock liver, and others.    Palliative medicine was consulted for GOC conversations.  Subjective:   Subjective: Chart Reviewed. Updates received. Patient Assessed. Created space and opportunity for patient  and family to explore thoughts and feelings regarding current medical situation.  Today's Discussion: Today saw the patient at bedside, no family was present.  Today he denies dyspnea, chest pain, pain in general, nausea, vomiting.  States his appetite is good and he ate breakfast this morning.  We spent time discussing HCPOA, he confirmed the paperwork was filled out yesterday but they are coming today for notary and witnessing/signing.  We discussed the tentative plan to try coming off the milrinone  today to see how he does.  I shared that if he does not do well we would have to have other talks on how to care for him and reminded him that we may be running out of options for how to fix his heart/support him toward treatment/cure moving forward.  At this point he became tearful.  We spent a substantial amount of time talking and allowing him to express his fears.  He states he is not afraid of dying but he is afraid of leaving his  wonderful family.  We shared the strong balance he has with his daughters and his 62-month-old grandchild.  We spent time talking through fears related to mortality, grief related to leaving family, and his faith based recognition that he will have a choice for union in heaven.  This seems to bring him comfort.  I encouraged him to speak with the chaplain if he feels they moved when she comes to complete HCPOA.  I told the patient that I would come back tomorrow to check on him and would keep an eye on his chart today to see how he does off milrinone . I provided emotional and general support through therapeutic listening, empathy, sharing of stories, therapeutic touch, and other techniques. I answered all questions and addressed all concerns to the best of my ability.  Review of Systems  Constitutional:        Denies pain in general  Respiratory:  Negative for shortness of breath.   Cardiovascular:  Negative for chest pain.  Gastrointestinal:  Negative for abdominal pain, nausea and vomiting.    Objective:   Vital Signs:  BP 98/70 (BP Location: Left Arm)   Pulse 70   Temp 98.4 F (36.9 C) (Oral)   Resp 18   Ht 5' 11 (1.803 m)   Wt 77.4 kg   SpO2 100%   BMI 23.80 kg/m   Physical Exam Vitals and nursing note reviewed.  Constitutional:      General: He is not in acute distress.    Appearance: He is ill-appearing.  HENT:  Head: Normocephalic and atraumatic.  Cardiovascular:     Rate and Rhythm: Regular rhythm. Tachycardia present.  Pulmonary:     Effort: Pulmonary effort is normal. No respiratory distress.     Breath sounds: No wheezing or rhonchi.  Abdominal:     General: Abdomen is flat. There is no distension.     Palpations: Abdomen is soft.     Tenderness: There is no abdominal tenderness.  Skin:    General: Skin is warm and dry.  Neurological:     General: No focal deficit present.     Mental Status: He is alert.  Psychiatric:        Mood and Affect: Mood normal.  Affect is tearful (Intermittently).        Behavior: Behavior normal.     Palliative Assessment/Data: 50%    Existing Vynca/ACP Documentation: None  Assessment & Plan:   Impression: Present on Admission:  Persistent atrial fibrillation (HCC)  Atrial fibrillation with RVR (HCC)  83 year old male with acute presentation chronic comorbidities as described above. Patient and daughter Brandon [name] are very well aware of his current clinical situation and the predicament this process. They understand we will attempt milrinone  wean again tomorrow and if he does not do well we will not have conversations on how to proceed. Overall patient feels well today, discussed fears and grieving related to possible loss of his family if he passes. Further GOC conversations as needed. Overall long-term prognosis poor   SUMMARY OF RECOMMENDATIONS   DNR-interventions desired Continue full scope of care otherwise Anticipate attempted milrinone  wean today Time for outcomes Palliative medicine will follow-up tomorrow to see how he does off milrinone  Further conversations depending on evolution of his clinical picture Continued emotional and spiritual support of patient and family Appreciate chaplain and social work support and attempted to complete HCPOA documentation  Symptom Management:  Per primary team PMT is available to assist as needed  Code Status: DNR-interventions desired  Prognosis: Unable to determine  Discharge Planning: To Be Determined  Discussed with: Patient, medical team, nursing team  Thank you for allowing us  to participate in the care of Brandon [name] PMT will continue to support holistically.  Time Total: 38 min  Detailed review of medical records (labs, imaging, vital signs), medically appropriate exam, discussed with treatment team, counseling and education to patient, family, & staff, documenting clinical information, medication management, coordination of care  Brandon Kays, NP Palliative Medicine Team  Team Phone # [phone] (Nights/Weekends)  11/11/2020, 8:17 AM

## 2023-04-22 DIAGNOSIS — Z66 Do not resuscitate: Secondary | ICD-10-CM | POA: Diagnosis not present

## 2023-04-22 DIAGNOSIS — I4891 Unspecified atrial fibrillation: Secondary | ICD-10-CM | POA: Diagnosis not present

## 2023-04-22 DIAGNOSIS — Z515 Encounter for palliative care: Secondary | ICD-10-CM | POA: Diagnosis not present

## 2023-04-22 DIAGNOSIS — I5023 Acute on chronic systolic (congestive) heart failure: Secondary | ICD-10-CM | POA: Diagnosis not present

## 2023-04-22 DIAGNOSIS — Z7189 Other specified counseling: Secondary | ICD-10-CM | POA: Diagnosis not present

## 2023-04-22 LAB — BASIC METABOLIC PANEL
Anion gap: 16 — ABNORMAL HIGH (ref 5–15)
BUN: 83 mg/dL — ABNORMAL HIGH (ref 8–23)
CO2: 28 mmol/L (ref 22–32)
Calcium: 9.4 mg/dL (ref 8.9–10.3)
Chloride: 84 mmol/L — ABNORMAL LOW (ref 98–111)
Creatinine, Ser: 3.63 mg/dL — ABNORMAL HIGH (ref 0.61–1.24)
GFR, Estimated: 16 mL/min — ABNORMAL LOW (ref >60)
Glucose, Bld: 197 mg/dL — ABNORMAL HIGH (ref 70–99)
Potassium: 3.9 mmol/L (ref 3.5–5.1)
Sodium: 128 mmol/L — ABNORMAL LOW (ref 135–145)

## 2023-04-22 LAB — COOXEMETRY PANEL
Carboxyhemoglobin: 0.6 % (ref 0.5–1.5)
Carboxyhemoglobin: 1.2 % (ref 0.5–1.5)
Methemoglobin: <0.7 % (ref 0.0–1.5)
Methemoglobin: <0.7 % (ref 0.0–1.5)
O2 Saturation: 56 %
O2 Saturation: 63 %
Total hemoglobin: 13.1 g/dL (ref 12.0–16.0)
Total hemoglobin: 13.4 g/dL (ref 12.0–16.0)

## 2023-04-22 LAB — CBC
HCT: 37.6 % — ABNORMAL LOW (ref 39.0–52.0)
Hemoglobin: 12.7 g/dL — ABNORMAL LOW (ref 13.0–17.0)
MCH: 31.6 pg (ref 26.0–34.0)
MCHC: 33.8 g/dL (ref 30.0–36.0)
MCV: 93.5 fL (ref 80.0–100.0)
Platelets: 125 10*3/uL — ABNORMAL LOW (ref 150–400)
RBC: 4.02 MIL/uL — ABNORMAL LOW (ref 4.22–5.81)
RDW: 15.1 % (ref 11.5–15.5)
WBC: 7.7 10*3/uL (ref 4.0–10.5)
nRBC: 0 % (ref 0.0–0.2)

## 2023-04-22 MED ORDER — TORSEMIDE 20 MG PO TABS
40.0000 mg | ORAL_TABLET | Freq: Every day | ORAL | Status: DC
  Administered 2023-04-23 – 2023-04-28 (×6): 40 mg via ORAL
  Filled 2023-04-22 (×6): qty 2

## 2023-04-22 MED ORDER — AMIODARONE HCL 200 MG PO TABS
200.0000 mg | ORAL_TABLET | Freq: Two times a day (BID) | ORAL | Status: DC
  Administered 2023-04-22 – 2023-04-25 (×8): 200 mg via ORAL
  Filled 2023-04-22 (×8): qty 1

## 2023-04-22 NOTE — Plan of Care (Signed)
  Problem: Education: Goal: Knowledge of disease or condition will improve Outcome: Progressing Goal: Understanding of medication regimen will improve Outcome: Progressing Goal: Individualized Educational Video(s) Outcome: Progressing   Problem: Activity: Goal: Ability to tolerate increased activity will improve Outcome: Progressing   Problem: Cardiac: Goal: Ability to achieve and maintain adequate cardiopulmonary perfusion will improve Outcome: Progressing   Problem: Health Behavior/Discharge Planning: Goal: Ability to safely manage health-related needs after discharge will improve Outcome: Progressing   Problem: Education: Goal: Knowledge of General Education information will improve Description: Including pain rating scale, medication(s)/side effects and non-pharmacologic comfort measures Outcome: Progressing   Problem: Health Behavior/Discharge Planning: Goal: Ability to manage health-related needs will improve Outcome: Progressing   Problem: Clinical Measurements: Goal: Ability to maintain clinical measurements within normal limits will improve Outcome: Progressing Goal: Will remain free from infection Outcome: Progressing Goal: Diagnostic test results will improve Outcome: Progressing Goal: Respiratory complications will improve Outcome: Progressing Goal: Cardiovascular complication will be avoided Outcome: Progressing   Problem: Activity: Goal: Risk for activity intolerance will decrease Outcome: Progressing   Problem: Nutrition: Goal: Adequate nutrition will be maintained Outcome: Progressing   Problem: Coping: Goal: Level of anxiety will decrease Outcome: Progressing   Problem: Elimination: Goal: Will not experience complications related to bowel motility Outcome: Progressing Goal: Will not experience complications related to urinary retention Outcome: Progressing   Problem: Pain Managment: Goal: General experience of comfort will improve and/or be  controlled Outcome: Progressing   Problem: Skin Integrity: Goal: Risk for impaired skin integrity will decrease Outcome: Progressing   Problem: Safety: Goal: Ability to remain free from injury will improve Outcome: Progressing   Problem: Education: Goal: Ability to demonstrate management of disease process will improve Outcome: Progressing Goal: Ability to verbalize understanding of medication therapies will improve Outcome: Progressing Goal: Individualized Educational Video(s) Outcome: Progressing   Problem: Activity: Goal: Capacity to carry out activities will improve Outcome: Progressing   Problem: Cardiac: Goal: Ability to achieve and maintain adequate cardiopulmonary perfusion will improve Outcome: Progressing

## 2023-04-22 NOTE — Progress Notes (Signed)
 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     Subjective:     2/5 Milrinone  and lasix  gtt discontinued. TEE/DCCV EF 10-15%, Cardioverted x 2>>NSR. Failed Milrinone  wean, 0.125 of milrinone  restarted.  Co-ox 56. Off Milrinone  Scr trending up, 3.2>3.6. Weight up 5lb overnight.  Denies dyspnea, orthopnea and PND. Appetite better, ate all of his breakfast.  Objective:    Weight Range: 79.3 kg Body mass index is 24.38 kg/m.   Vital Signs:   Temp:  [97.9 F (36.6 C)-98.6 F (37 C)] 98.5 F (36.9 C) (02/07 0717) Pulse Rate:  [61-72] 72 (02/07 0717) Resp:  [17-19] 18 (02/07 0717) BP: (92-103)/(59-77) 101/75 (02/07 0717) SpO2:  [96 %-100 %] 100 % (02/07 0733) Weight:  [79.3 kg] 79.3 kg (02/07 0528) Last BM Date : 04/21/23  Weight change: Filed Weights   04/20/23 0500 04/21/23 0448 04/22/23 0528  Weight: 77.9 kg 77.4 kg 79.3 kg    Intake/Output:   Intake/Output Summary (Last 24 hours) at 04/22/2023 0905 Last data filed at 04/22/2023 0831 Gross per 24 hour  Intake 1973.62 ml  Output 1000 ml  Net 973.62 ml    Physical Exam    CVP 5-6 General: Well appearing. No distress on RA HEENT: neck supple.   Cardiac: JVP <5cm. S1 and S2 present. Systolic murmur 1/6 Extremities: Warm and dry. No rash, cyanosis.  No peripheral edema.  Neuro: Alert and oriented x3. Affect pleasant. Moves all extremities without difficulty. Lines/Devices:  RUE PICC  Telemetry   SR 60 (personally reviewed)  Labs    CBC Recent Labs    04/22/23 0633  WBC 7.7  HGB 12.7*  HCT 37.6*  MCV 93.5  PLT 125*   Basic Metabolic Panel Recent Labs    97/94/74 0452 04/21/23 0228 04/22/23 0633  NA 128* 130* 128*  K 3.6  4.1 3.9  CL 81* 88* 84*  CO2 29 28 28   GLUCOSE 285* 122* 197*  BUN 77* 88* 83*  CREATININE 3.19* 3.59* 3.63*  CALCIUM  9.3 9.5 9.4  MG 2.1 2.2  --    Liver Function Tests No results for input(s): AST, ALT, ALKPHOS, BILITOT, PROT, ALBUMIN in the last 72 hours.  No results for input(s): LIPASE, AMYLASE in the last 72 hours. Cardiac Enzymes No results for input(s): CKTOTAL, CKMB, CKMBINDEX, TROPONINI in the last 72 hours.  BNP: BNP (last 3 results) Recent Labs    04/15/23 1627  BNP 2,612.3*   ProBNP (last 3 results) No results for input(s): PROBNP in the last 8760 hours.  D-Dimer No results for input(s): DDIMER in the last 72 hours. Hemoglobin A1C No results for input(s): HGBA1C in the last 72 hours. Fasting Lipid Panel No results for input(s): CHOL, HDL, LDLCALC, TRIG, CHOLHDL, LDLDIRECT in the last 72 hours.  Thyroid  Function Tests No results for input(s): TSH, T4TOTAL, T3FREE, THYROIDAB in the last 72 hours.  Invalid input(s): FREET3  Other results:  Medications:    Scheduled Medications:  apixaban   2.5 mg Oral BID   Chlorhexidine  Gluconate Cloth  6 each Topical Daily   feeding supplement  237 mL Oral BID BM   gabapentin   300 mg Oral QHS   mometasone -formoterol   2 puff  Inhalation BID   montelukast   10 mg Oral QHS   sodium chloride  flush  10-40 mL Intracatheter Q12H    Infusions:  amiodarone  60 mg/hr (04/22/23 0618)    PRN Medications: acetaminophen , ondansetron  (ZOFRAN ) IV, mouth rinse, sodium chloride  flush  Assessment/Plan   Cardiogenic shock: Presented with elevated lactate, hepatic congestion, borderline BP in the setting of recurrent atrial fibrillation. ACC/AHA Stage D end stage cardiomyopathy. Not an advanced therapies candidate given age. Placed on milrinone  and lasix  gtt for palliative diuresis. S/p successful DCCV 2/4 and maintaining NSR but failed milrinone  wean. Co-ox post cardioversion 48%.  Now back on low dose milrinone  0.125. - Coox 56, off milrinone . Will likely need to transition to comfort/hospice care. Will continue to follow co-ox - CVP 5-6. Scr relatively unchanged 3.2>3.6.  - continue holding jardiance  - other GDMT limited by CKD and soft BP   Atrial fibrillation: Unclear if primary cause or secondary to his decompensation. S/p DCCV 2/4, took 2 attempts. Maintaining NSR.  - start PO amio 200 mg bid today. Stop gtt. - Eliquis  2.5 mg bid  Shock liver: LFTs elevated on arrival, secondary to congestion and shock.  - improving with trreatment of shock and diuresis  AKI on CKD IIIb: Secondary to cardiorenal syndrome and shock. - b/l SCr 2.7 - SCr 3.2>3.6. Relatively unchanged. Holding diuretics. - Follow closely  Hyperkalemia:  - Resolved  COPD:  - Home inhalers  HLD: - Holding home statin with shock liver  GOC: He has end-stage HF. Long-term prognosis is poor. Failed initial milrinone  wean. Milrinone  now off again with worsening cardiac function and volume accumulation. Hospice would be reasonable at discharge.  -Code status now DNR (would desire intubation) -Appreciate Palliative Care  Length of Stay: 7  Mohsin Crum, NP  04/22/2023, 9:05 AM  Advanced Heart Failure Team Pager [phone] (M-F; 7a - 5p)  Please contact [facility] for night-coverage after hours (5p -7a ) and weekends on [email or web address]

## 2023-04-22 NOTE — Progress Notes (Signed)
 Daily Progress Note   Patient Name: Brandon [name]       Date: 04/22/2023 DOB: December 15, 1940  Age: 83 y.o. MRN#: [id number] Attending Physician: Zenaida Morene PARAS, MD Primary Care Physician: Daryl Setter, NP Admit Date: 04/15/2023 Length of Stay: 7 days  Reason for Consultation/Follow-up: Establishing goals of care  HPI/Patient Profile:  83 y.o. 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 He presented from primary cardiologist office on recommendation due to shortness of breath.  He was admitted on 04/15/2023 with persistent atrial fibrillation (now status post DCCV after TEE), nonischemic cardiomyopathy, end-stage heart failure, group 2 pulmonary hypertension, AKI on CKD, shock liver, and others.    Palliative medicine was consulted for GOC conversations.  Subjective:   Subjective: Chart Reviewed. Updates received. Patient Assessed. Created space and opportunity for patient  and family to explore thoughts and feelings regarding current medical situation.  Today's Discussion: Today saw the patient at bedside, no family was present.  Shortly into my visit the patient's wife Brandon [name] came to the bedside.  We had a good discussion about his clinical status.  He understands he is end-stage heart failure with no options left.  He understands he is facing end-of-life.  We had a good discussion about hospice. I described hospice as a service for patients who have a life expectancy of 6 months or less. The goal of hospice is the preservation of dignity and quality at the end phases of life. Under hospice care, the focus changes from curative to symptom relief. I explained the three setting where hospice services can be provided including the home, at a living facility (such as LTC SNF, Assisted Living, etc), and a hospice facility. I explained that acceptance to hospice in any specific location is the final decision of  the hospice medical director and bed availability, if applicable. They verbalized understanding.  Patient has agreed to referral for evaluation of inpatient hospice.  After I left the room I called the patient's daughter/HCPOA Brandon [name].  We had a similar conversation as per above.  Brandon [name] states she has been speaking with her siblings and they are agreeable to hospice.  They also agree that patient likely cannot receive hospice at home due to inability of adequate caregiver support.  I discussed plan for referral to inpatient hospice and she is in agreement.  I called and spoke with the hospice of the [facility].  They will attempt a remote evaluation tomorrow.  Pending medical visit director decision we can discuss reevaluating next week if he does not meet criteria tomorrow.  I reviewed this situation with Dr. Zenaida and he is in agreement with keeping the patient over the weekend.  I shared that I would have a conversation about making the patient comfort care in the hospital while we are awaiting placement.  All are in agreement.  I provided emotional and general support through therapeutic listening, empathy, sharing of stories, therapeutic touch, and other techniques. I answered all questions and addressed all concerns to the best of my ability.  Review of Systems  Constitutional:        Denies pain in general  Respiratory:  Negative for shortness of breath.   Cardiovascular:  Negative for chest pain.  Gastrointestinal:  Negative for abdominal pain, nausea and vomiting.    Objective:   Vital Signs:  BP 101/75 (BP Location: Left Arm)   Pulse 72  Temp 98.5 F (36.9 C) (Oral)   Resp 18   Ht 5' 11 (1.803 m)   Wt 79.3 kg   SpO2 100%   BMI 24.38 kg/m   Physical Exam Vitals and nursing note reviewed.  Constitutional:      General: He is not in acute distress.    Appearance: He is ill-appearing.  HENT:     Head: Normocephalic and atraumatic.  Cardiovascular:     Rate and  Rhythm: Regular rhythm. Tachycardia present.  Pulmonary:     Effort: Pulmonary effort is normal. No respiratory distress.     Breath sounds: No wheezing or rhonchi.  Abdominal:     General: Abdomen is flat. There is no distension.     Palpations: Abdomen is soft.     Tenderness: There is no abdominal tenderness.  Skin:    General: Skin is warm and dry.  Neurological:     General: No focal deficit present.     Mental Status: He is alert.  Psychiatric:        Mood and Affect: Mood normal. Affect is tearful (Intermittently).        Behavior: Behavior normal.     Palliative Assessment/Data: 50%    Existing Vynca/ACP Documentation: None  Assessment & Plan:   Impression: Present on Admission:  Persistent atrial fibrillation (HCC)  Atrial fibrillation with RVR (HCC)  83 year old male with acute presentation chronic comorbidities as described above. Patient and daughter Brandon [name] are very well aware of his current clinical situation and the predicament this process.  He appears stable and feels well off milrinone , although his labs are worse.  Patient and family understand he is approaching end-of-life and have agreed to referral for inpatient hospice. Overall long-term prognosis poor   SUMMARY OF RECOMMENDATIONS   DNR-interventions desired Agreeable to hospice evaluation TOC consult for hospice referral to hospice of the [place]; family on board Palliative medicine will follow-up tomorrow to discuss inpatient comfort care here while pending placement Continued emotional and spiritual support of patient and family  Symptom Management:  Per primary team PMT is available to assist as needed  Code Status: DNR-interventions desired  Prognosis: Unable to determine  Discharge Planning: To Be Determined  Discussed with: Patient, patient's family, medical team, nursing team  Thank you for allowing us  to participate in the care of Othniel E Rothschild PMT will continue to support  holistically.  Time Total: 84 min  Detailed review of medical records (labs, imaging, vital signs), medically appropriate exam, discussed with treatment team, counseling and education to patient, family, & staff, documenting clinical information, medication management, coordination of care  Camellia Kays, NP Palliative Medicine Team  Team Phone # [phone] (Nights/Weekends)  11/11/2020, 8:17 AM

## 2023-04-22 NOTE — Plan of Care (Signed)
  Problem: Education: Goal: Knowledge of disease or condition will improve Outcome: Progressing   Problem: Activity: Goal: Ability to tolerate increased activity will improve Outcome: Progressing   Problem: Health Behavior/Discharge Planning: Goal: Ability to safely manage health-related needs after discharge will improve Outcome: Progressing   Problem: Education: Goal: Knowledge of General Education information will improve Description: Including pain rating scale, medication(s)/side effects and non-pharmacologic comfort measures Outcome: Progressing   Problem: Activity: Goal: Risk for activity intolerance will decrease Outcome: Progressing   Problem: Nutrition: Goal: Adequate nutrition will be maintained Outcome: Progressing   Problem: Activity: Goal: Capacity to carry out activities will improve Outcome: Progressing

## 2023-04-23 DIAGNOSIS — Z66 Do not resuscitate: Secondary | ICD-10-CM | POA: Diagnosis not present

## 2023-04-23 DIAGNOSIS — Z7189 Other specified counseling: Secondary | ICD-10-CM | POA: Diagnosis not present

## 2023-04-23 DIAGNOSIS — Z515 Encounter for palliative care: Secondary | ICD-10-CM | POA: Diagnosis not present

## 2023-04-23 DIAGNOSIS — I5020 Unspecified systolic (congestive) heart failure: Secondary | ICD-10-CM | POA: Diagnosis not present

## 2023-04-23 DIAGNOSIS — I4891 Unspecified atrial fibrillation: Secondary | ICD-10-CM | POA: Diagnosis not present

## 2023-04-23 LAB — BASIC METABOLIC PANEL
Anion gap: 18 — ABNORMAL HIGH (ref 5–15)
BUN: 80 mg/dL — ABNORMAL HIGH (ref 8–23)
CO2: 28 mmol/L (ref 22–32)
Calcium: 10.5 mg/dL — ABNORMAL HIGH (ref 8.9–10.3)
Chloride: 86 mmol/L — ABNORMAL LOW (ref 98–111)
Creatinine, Ser: 3.36 mg/dL — ABNORMAL HIGH (ref 0.61–1.24)
GFR, Estimated: 18 mL/min — ABNORMAL LOW (ref >60)
Glucose, Bld: 104 mg/dL — ABNORMAL HIGH (ref 70–99)
Potassium: 3.9 mmol/L (ref 3.5–5.1)
Sodium: 132 mmol/L — ABNORMAL LOW (ref 135–145)

## 2023-04-23 LAB — COOXEMETRY PANEL
Carboxyhemoglobin: 0.9 % (ref 0.5–1.5)
Methemoglobin: <0.7 % (ref 0.0–1.5)
O2 Saturation: 60.8 %
Total hemoglobin: 13.4 g/dL (ref 12.0–16.0)

## 2023-04-23 MED ORDER — GLYCOPYRROLATE 1 MG PO TABS: 1.0000 mg | ORAL_TABLET | ORAL | Status: DC | PRN

## 2023-04-23 MED ORDER — GLYCOPYRROLATE 0.2 MG/ML IJ SOLN: 0.2000 mg | INTRAMUSCULAR | Status: DC | PRN

## 2023-04-23 MED ORDER — HALOPERIDOL LACTATE 5 MG/ML IJ SOLN: 0.5000 mg | INTRAMUSCULAR | Status: DC | PRN

## 2023-04-23 MED ORDER — ACETAMINOPHEN 650 MG RE SUPP: 650.0000 mg | Freq: Four times a day (QID) | RECTAL | Status: DC | PRN

## 2023-04-23 MED ORDER — ACETAMINOPHEN 325 MG PO TABS
650.0000 mg | ORAL_TABLET | Freq: Four times a day (QID) | ORAL | Status: DC | PRN
  Administered 2023-04-27: 650 mg via ORAL
  Filled 2023-04-23: qty 2

## 2023-04-23 MED ORDER — LORAZEPAM 1 MG PO TABS
1.0000 mg | ORAL_TABLET | ORAL | Status: DC | PRN
  Administered 2023-04-26 – 2023-04-28 (×4): 1 mg via ORAL
  Filled 2023-04-23 (×4): qty 1

## 2023-04-23 MED ORDER — HYDROMORPHONE HCL 1 MG/ML IJ SOLN
0.5000 mg | INTRAMUSCULAR | Status: DC | PRN
  Administered 2023-04-25: 0.5 mg via INTRAVENOUS
  Administered 2023-04-26 – 2023-04-27 (×2): 1 mg via INTRAVENOUS
  Filled 2023-04-23 (×3): qty 1

## 2023-04-23 MED ORDER — HALOPERIDOL 0.5 MG PO TABS: 0.5000 mg | ORAL_TABLET | ORAL | Status: DC | PRN

## 2023-04-23 MED ORDER — ONDANSETRON HCL 4 MG/2ML IJ SOLN: 4.0000 mg | Freq: Four times a day (QID) | INTRAMUSCULAR | Status: DC | PRN

## 2023-04-23 MED ORDER — POLYVINYL ALCOHOL 1.4 % OP SOLN: 1.0000 [drp] | Freq: Four times a day (QID) | OPHTHALMIC | Status: DC | PRN

## 2023-04-23 MED ORDER — ONDANSETRON 4 MG PO TBDP
4.0000 mg | ORAL_TABLET | Freq: Four times a day (QID) | ORAL | Status: DC | PRN
  Administered 2023-04-26 – 2023-04-27 (×2): 4 mg via ORAL
  Filled 2023-04-23 (×2): qty 1

## 2023-04-23 MED ORDER — LORAZEPAM 2 MG/ML PO CONC: 1.0000 mg | ORAL | Status: DC | PRN

## 2023-04-23 MED ORDER — HALOPERIDOL LACTATE 2 MG/ML PO CONC: 0.5000 mg | ORAL | Status: DC | PRN

## 2023-04-23 MED ORDER — LORAZEPAM 2 MG/ML IJ SOLN: 1.0000 mg | INTRAMUSCULAR | Status: DC | PRN

## 2023-04-23 MED ORDER — BIOTENE DRY MOUTH MT LIQD: 15.0000 mL | OROMUCOSAL | Status: DC | PRN

## 2023-04-23 NOTE — Consult Note (Signed)
 Patient has been reviewed for inpatient Hospice at [facility] of the [place]. At this time he does not meet GIP level of care and our IPU only accepts GIP level of care. I have not had a chance to talk to patient daughter due to lateness of hour but I will touch base with her tomorrow.   Please call with any questions or concerns. Matilda Pack, RN BSN [facility] [place] [phone].

## 2023-04-23 NOTE — Progress Notes (Signed)
 Rounding Note    Patient Name: Brandon [name] Date of Encounter: 04/23/2023  [facility] Cardiologist: Lynwood Schilling, MD   Subjective   83 year old gentleman with end-stage congestive heart failure.  He was admitted with cardiogenic shock.  He has been diuresed.  His creatinine remains very elevated.  He is not a candidate for any advanced CHF therapies and is not a candidate for intermittent dialysis.  He is a candidate for home hospice.  Creatinine is 3.36.  He has been seen by the palliative care nurse practitioner.  The plan is that he should be evaluated by the hospice team today.  The plan is for him to go to a hospice facility or perhaps inpatient hospice.    Inpatient Medications    Scheduled Meds:  amiodarone   200 mg Oral BID   Chlorhexidine  Gluconate Cloth  6 each Topical Daily   feeding supplement  237 mL Oral BID BM   gabapentin   300 mg Oral QHS   mometasone -formoterol   2 puff Inhalation BID   montelukast   10 mg Oral QHS   sodium chloride  flush  10-40 mL Intracatheter Q12H   torsemide   40 mg Oral Daily   Continuous Infusions:  PRN Meds: acetaminophen  **OR** acetaminophen , antiseptic oral rinse, glycopyrrolate  **OR** glycopyrrolate  **OR** glycopyrrolate , haloperidol  **OR** haloperidol  **OR** haloperidol  lactate, HYDROmorphone  (DILAUDID ) injection, LORazepam  **OR** LORazepam  **OR** LORazepam , ondansetron  **OR** ondansetron  (ZOFRAN ) IV, mouth rinse, polyvinyl alcohol , sodium chloride  flush   Vital Signs    Vitals:   04/23/23 0412 04/23/23 0837 04/23/23 0958 04/23/23 1136  BP: 113/78 105/68  101/72  Pulse: 71 67 63 65  Resp:  20  20  Temp: 98.6 F (37 C) 98.3 F (36.8 C)  98 F (36.7 C)  TempSrc: Oral Oral  Oral  SpO2:  100% 100% 100%  Weight: 78.2 kg     Height:        Intake/Output Summary (Last 24 hours) at 04/23/2023 1510 Last data filed at 04/23/2023 1100 Gross per 24 hour  Intake 537 ml  Output 2450 ml  Net -1913 ml      04/23/2023     4:12 AM 04/22/2023    5:28 AM 04/21/2023    4:48 AM  Last 3 Weights  Weight (lbs) 172 lb 6.4 oz 174 lb 13.2 oz 170 lb 10.2 oz  Weight (kg) 78.2 kg 79.3 kg 77.4 kg      Telemetry    Sinus rhythm at 87  - Personally Reviewed  ECG     - Personally Reviewed  Physical Exam   GEN: No acute distress.   Neck: No JVD Cardiac: RRR,   soft systolic murmur  Respiratory: Clear to auscultation bilaterally. GI: Soft, nontender, non-distended  MS: No edema; No deformity. Neuro:  Nonfocal  Psych: Normal affect   Labs    High Sensitivity Troponin:   Recent Labs  Lab 04/15/23 1627 04/15/23 1841  TROPONINIHS 63* 57*     Chemistry Recent Labs  Lab 04/18/23 0445 04/18/23 1517 04/19/23 0357 04/19/23 0358 04/20/23 0452 04/21/23 0228 04/22/23 0633 04/23/23 0527  NA 132*   < >  --  128* 128* 130* 128* 132*  K 4.0   < >  --  3.8 3.6 4.1 3.9 3.9  CL 91*   < >  --  83* 81* 88* 84* 86*  CO2 28   < >  --  28 29 28 28 28   GLUCOSE 121*   < >  --  322* 285* 122*  197* 104*  BUN 74*   < >  --  75* 77* 88* 83* 80*  CREATININE 3.29*   < >  --  3.12* 3.19* 3.59* 3.63* 3.36*  CALCIUM  9.5   < >  --  9.1 9.3 9.5 9.4 10.5*  MG 2.7*  --   --  2.3 2.1 2.2  --   --   PROT 6.1*  --  5.9*  --   --   --   --   --   ALBUMIN 3.3*  --  3.1*  --   --   --   --   --   AST 191*  --  97*  --   --   --   --   --   ALT 1,001*  --  688*  --   --   --   --   --   ALKPHOS 128*  --  113  --   --   --   --   --   BILITOT 0.9  --  1.1  --   --   --   --   --   GFRNONAA 18*   < >  --  19* 19* 16* 16* 18*  ANIONGAP 13   < >  --  17* 18* 14 16* 18*   < > = values in this interval not displayed.    Lipids No results for input(s): CHOL, TRIG, HDL, LABVLDL, LDLCALC, CHOLHDL in the last 168 hours.  Hematology Recent Labs  Lab 04/16/23 1715 04/19/23 0358 04/22/23 0633  WBC  --  8.2 7.7  RBC  --  3.76* 4.02*  HGB 11.2* 11.9* 12.7*  HCT 33.0* 35.6* 37.6*  MCV  --  94.7 93.5  MCH  --  31.6 31.6  MCHC   --  33.4 33.8  RDW  --  15.7* 15.1  PLT  --  143* 125*   Thyroid  No results for input(s): TSH, FREET4 in the last 168 hours.  BNPNo results for input(s): BNP, PROBNP in the last 168 hours.  DDimer No results for input(s): DDIMER in the last 168 hours.   Radiology    No results found.  Cardiac Studies      Patient Profile     83 y.o. male   Assessment & Plan    End-stage CHF: Akeen has an EF of 20 to 25%.  RV is moderately enlarged with moderately elevated pulmonary pressures.  He is got acute on chronic renal insufficiency.  At this point he is off of pressors and appears to be fairly stable.  He has been seen by palliative care in the plan is for him to be seen by the hospice team and transition to inpatient hospice.      Time spent on exam of patient, discussion with patient and famiy, chart review , note writing  - 30 min    For questions or updates, please contact [facility] Please consult [email or web address] for contact info under        Signed, Aleene Passe, MD  04/23/2023, 3:10 PM

## 2023-04-23 NOTE — Plan of Care (Signed)
  Problem: Education: Goal: Knowledge of disease or condition will improve Outcome: Progressing Goal: Understanding of medication regimen will improve Outcome: Progressing Goal: Individualized Educational Video(s) Outcome: Progressing   Problem: Activity: Goal: Ability to tolerate increased activity will improve Outcome: Progressing   Problem: Cardiac: Goal: Ability to achieve and maintain adequate cardiopulmonary perfusion will improve Outcome: Progressing   Problem: Health Behavior/Discharge Planning: Goal: Ability to safely manage health-related needs after discharge will improve Outcome: Progressing   Problem: Education: Goal: Knowledge of General Education information will improve Description: Including pain rating scale, medication(s)/side effects and non-pharmacologic comfort measures Outcome: Progressing   Problem: Health Behavior/Discharge Planning: Goal: Ability to manage health-related needs will improve Outcome: Progressing   Problem: Activity: Goal: Risk for activity intolerance will decrease Outcome: Progressing   Problem: Nutrition: Goal: Adequate nutrition will be maintained Outcome: Progressing   Problem: Pain Managment: Goal: General experience of comfort will improve and/or be controlled Outcome: Progressing   Problem: Safety: Goal: Ability to remain free from injury will improve Outcome: Progressing   Problem: Skin Integrity: Goal: Risk for impaired skin integrity will decrease Outcome: Progressing   Problem: Education: Goal: Ability to demonstrate management of disease process will improve Outcome: Progressing Goal: Ability to verbalize understanding of medication therapies will improve Outcome: Progressing Goal: Individualized Educational Video(s) Outcome: Progressing   Problem: Education: Goal: Ability to verbalize understanding of medication therapies will improve Outcome: Progressing

## 2023-04-23 NOTE — Progress Notes (Signed)
 Daily Progress Note   Patient Name: Brandon [name]       Date: 04/23/2023 DOB: Aug 27, 1940  Age: 83 y.o. MRN#: [id number] Attending Physician: Zenaida Morene PARAS, MD Primary Care Physician: Daryl Setter, NP Admit Date: 04/15/2023 Length of Stay: 8 days  Reason for Consultation/Follow-up: Establishing goals of care  HPI/Patient Profile:  83 y.o. 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 He presented from primary cardiologist office on recommendation due to shortness of breath.  He was admitted on 04/15/2023 with persistent atrial fibrillation (now status post DCCV after TEE), nonischemic cardiomyopathy, end-stage heart failure, group 2 pulmonary hypertension, AKI on CKD, shock liver, and others.    Palliative medicine was consulted for GOC conversations.  Subjective:   Subjective: Chart Reviewed. Updates received. Patient Assessed. Created space and opportunity for patient  and family to explore thoughts and feelings regarding current medical situation.  Today's Discussion: Today saw the patient at bedside, his son and son's fianc were present.  I updated the patient and the family about his clinical situation.  I shared that it is amazing how good he looks being how weak his heart is and how poor his kidneys are functioning.  I shared that he is high risk for sudden change in his condition which could bring symptoms.  We discussed that hospice is planning to evaluate him today, but there is a good chance that he will not be approved today.  They have offered to reevaluate Monday if he does not improve today and patient and family seem in agreement with this.  I shared that the patient is very high risk for sudden change in condition which could bring significant symptom burden.  I shared that hospice care is consistent with managing this.  I recommended that we transition to more of a comfort focused  care while he is here.  I shared that we would stop medications like blood thinner that or not going to have an impact on his short-term health.  However, we would keep diuretics as I can help maintain fluid balance and prevent fluid overload and discomfort/dyspnea.  They are in agreement with this.  At the end of my visit we spent some time sharing stories, laughing, and enjoying each other's company.  I provided emotional and general support through therapeutic listening, empathy, sharing of stories, therapeutic touch, and other techniques. I answered all questions and addressed all concerns to the best of my ability.  At the end of the visit I updated the medical and nursing team.  Review of Systems  Constitutional:        Denies pain in general, feels good overall  Respiratory:  Negative for shortness of breath.   Cardiovascular:  Negative for chest pain.  Gastrointestinal:  Negative for abdominal pain, nausea and vomiting.    Objective:   Vital Signs:  BP 101/72 (BP Location: Left Arm)   Pulse 65   Temp 98 F (36.7 C) (Oral)   Resp 20   Ht 5' 11 (1.803 m)   Wt 78.2 kg   SpO2 100%   BMI 24.04 kg/m   Physical Exam Vitals and nursing note reviewed.  Constitutional:      General: He is not in acute distress.    Appearance: He is ill-appearing.  HENT:     Head: Normocephalic and atraumatic.  Cardiovascular:     Rate and Rhythm: Regular rhythm.  Tachycardia present.  Pulmonary:     Effort: Pulmonary effort is normal. No respiratory distress.     Breath sounds: No wheezing or rhonchi.  Abdominal:     General: Abdomen is flat. There is no distension.     Palpations: Abdomen is soft.     Tenderness: There is no abdominal tenderness.  Skin:    General: Skin is warm and dry.  Neurological:     General: No focal deficit present.     Mental Status: He is alert.  Psychiatric:        Mood and Affect: Mood normal. Affect is tearful (Intermittently).        Behavior: Behavior  normal.     Palliative Assessment/Data: 50%    Existing Vynca/ACP Documentation: None  Assessment & Plan:   Impression: Present on Admission:  Persistent atrial fibrillation (HCC)  Atrial fibrillation with RVR (HCC)  83 year old male with acute presentation chronic comorbidities as described above. Patient and daughter [name] are very well aware of his current clinical situation and the predicament this process.  He appears stable and feels well off milrinone , although his labs are worse.  Patient and family understand he is approaching end-of-life and have agreed to referral for inpatient hospice.  They understand he is high risk for sudden decompensation and subsequently we have shifted to a more comfort focused approach.  Overall long-term prognosis poor   SUMMARY OF RECOMMENDATIONS   Changed to DNR-comfort Anticipating hospice evaluation today Transition to more comfort focused care Palliative medicine will follow-up tomorrow to discuss inpatient comfort care here while pending placement Continued emotional and spiritual support of patient and family  Symptom Management:    Code Status: DNR-interventions desired  Prognosis: Unable to determine  Discharge Planning: To Be Determined  Discussed with: Patient, patient's family, medical team, nursing team  Thank you for allowing us  to participate in the care of Brandon [name] PMT will continue to support holistically.  Time Total: 69 min  Detailed review of medical records (labs, imaging, vital signs), medically appropriate exam, discussed with treatment team, counseling and education to patient, family, & staff, documenting clinical information, medication management, coordination of care  Camellia Kays, NP Palliative Medicine Team  Team Phone # [phone] (Nights/Weekends)  11/11/2020, 8:17 AM

## 2023-04-23 NOTE — Progress Notes (Signed)
 Rounding Note    Patient Name: Brandon [name] Date of Encounter: 04/23/2023  [facility] Cardiologist: Lynwood Schilling, MD    Subjective    83 up admitted with cardiogenic shock , atrial fib with RVR  Has been diuresed Creatinine remain significantly elevated.  He appears to be fairly euvolemic.  He has been seen by palliative care.  His prognosis is extremely poor and he would be a home hospice candidate.  He is not a candidate for any advanced CHF therapies or intermittent hemodialysis.  Inpatient Medications    Scheduled Meds:  amiodarone   200 mg Oral BID   Chlorhexidine  Gluconate Cloth  6 each Topical Daily   feeding supplement  237 mL Oral BID BM   gabapentin   300 mg Oral QHS   mometasone -formoterol   2 puff Inhalation BID   montelukast   10 mg Oral QHS   sodium chloride  flush  10-40 mL Intracatheter Q12H   torsemide   40 mg Oral Daily   Continuous Infusions:  PRN Meds: acetaminophen  **OR** acetaminophen , antiseptic oral rinse, glycopyrrolate  **OR** glycopyrrolate  **OR** glycopyrrolate , haloperidol  **OR** haloperidol  **OR** haloperidol  lactate, HYDROmorphone  (DILAUDID ) injection, LORazepam  **OR** LORazepam  **OR** LORazepam , ondansetron  **OR** ondansetron  (ZOFRAN ) IV, mouth rinse, polyvinyl alcohol , sodium chloride  flush   Vital Signs    Vitals:   04/23/23 0412 04/23/23 0837 04/23/23 0958 04/23/23 1136  BP: 113/78 105/68  101/72  Pulse: 71 67 63 65  Resp:  20  20  Temp: 98.6 F (37 C) 98.3 F (36.8 C)  98 F (36.7 C)  TempSrc: Oral Oral  Oral  SpO2:  100% 100% 100%  Weight: 78.2 kg     Height:        Intake/Output Summary (Last 24 hours) at 04/23/2023 1506 Last data filed at 04/23/2023 1100 Gross per 24 hour  Intake 537 ml  Output 2450 ml  Net -1913 ml      04/23/2023    4:12 AM 04/22/2023    5:28 AM 04/21/2023    4:48 AM  Last 3 Weights  Weight (lbs) 172 lb 6.4 oz 174 lb 13.2 oz 170 lb 10.2 oz  Weight (kg) 78.2 kg 79.3 kg 77.4 kg      Telemetry      - Personally Reviewed  ECG     - Personally Reviewed  Physical Exam   GEN: No acute distress.   Neck: No JVD Cardiac: RRR, no murmurs, rubs, or gallops.  Respiratory: Clear to auscultation bilaterally. GI: Soft, nontender, non-distended  MS: No edema; No deformity. Neuro:  Nonfocal  Psych: Normal affect   Labs    High Sensitivity Troponin:   Recent Labs  Lab 04/15/23 1627 04/15/23 1841  TROPONINIHS 63* 57*     Chemistry Recent Labs  Lab 04/18/23 0445 04/18/23 1517 04/19/23 0357 04/19/23 0358 04/20/23 0452 04/21/23 0228 04/22/23 0633 04/23/23 0527  NA 132*   < >  --  128* 128* 130* 128* 132*  K 4.0   < >  --  3.8 3.6 4.1 3.9 3.9  CL 91*   < >  --  83* 81* 88* 84* 86*  CO2 28   < >  --  28 29 28 28 28   GLUCOSE 121*   < >  --  322* 285* 122* 197* 104*  BUN 74*   < >  --  75* 77* 88* 83* 80*  CREATININE 3.29*   < >  --  3.12* 3.19* 3.59* 3.63* 3.36*  CALCIUM  9.5   < >  --  9.1 9.3 9.5 9.4 10.5*  MG 2.7*  --   --  2.3 2.1 2.2  --   --   PROT 6.1*  --  5.9*  --   --   --   --   --   ALBUMIN 3.3*  --  3.1*  --   --   --   --   --   AST 191*  --  97*  --   --   --   --   --   ALT 1,001*  --  688*  --   --   --   --   --   ALKPHOS 128*  --  113  --   --   --   --   --   BILITOT 0.9  --  1.1  --   --   --   --   --   GFRNONAA 18*   < >  --  19* 19* 16* 16* 18*  ANIONGAP 13   < >  --  17* 18* 14 16* 18*   < > = values in this interval not displayed.    Lipids No results for input(s): CHOL, TRIG, HDL, LABVLDL, LDLCALC, CHOLHDL in the last 168 hours.  Hematology Recent Labs  Lab 04/16/23 1715 04/19/23 0358 04/22/23 0633  WBC  --  8.2 7.7  RBC  --  3.76* 4.02*  HGB 11.2* 11.9* 12.7*  HCT 33.0* 35.6* 37.6*  MCV  --  94.7 93.5  MCH  --  31.6 31.6  MCHC  --  33.4 33.8  RDW  --  15.7* 15.1  PLT  --  143* 125*   Thyroid  No results for input(s): TSH, FREET4 in the last 168 hours.  BNPNo results for input(s): BNP, PROBNP in the last 168  hours.  DDimer No results for input(s): DDIMER in the last 168 hours.   Radiology    No results found.  Cardiac Studies     Patient Profile     83 y.o. male with stage D systolic chf due to NICM,  PAF, CKD, COPD , pulmonary HTN    Assessment & Plan    CHF :   poor prognosis  We have consulted pallitative care and hospice Continue torsemide    2.  PAF :  on amiodareone          For questions or updates, please contact [facility] Please consult [email or web address] for contact info under        Signed, Aleene Passe, MD  04/23/2023, 3:06 PM

## 2023-04-24 DIAGNOSIS — Z7189 Other specified counseling: Secondary | ICD-10-CM | POA: Diagnosis not present

## 2023-04-24 DIAGNOSIS — I4819 Other persistent atrial fibrillation: Secondary | ICD-10-CM | POA: Diagnosis not present

## 2023-04-24 DIAGNOSIS — I5020 Unspecified systolic (congestive) heart failure: Secondary | ICD-10-CM

## 2023-04-24 DIAGNOSIS — Z789 Other specified health status: Secondary | ICD-10-CM

## 2023-04-24 DIAGNOSIS — Z515 Encounter for palliative care: Secondary | ICD-10-CM | POA: Diagnosis not present

## 2023-04-24 DIAGNOSIS — I4891 Unspecified atrial fibrillation: Secondary | ICD-10-CM | POA: Diagnosis not present

## 2023-04-24 LAB — GLUCOSE, CAPILLARY: Glucose-Capillary: 111 mg/dL — ABNORMAL HIGH (ref 70–99)

## 2023-04-24 MED ORDER — ENSURE ENLIVE PO LIQD: 237.0000 mL | ORAL | Status: DC | PRN

## 2023-04-24 MED ORDER — POTASSIUM CHLORIDE CRYS ER 10 MEQ PO TBCR
10.0000 meq | EXTENDED_RELEASE_TABLET | Freq: Every day | ORAL | Status: DC
  Administered 2023-04-24 – 2023-04-28 (×5): 10 meq via ORAL
  Filled 2023-04-24 (×5): qty 1

## 2023-04-24 NOTE — Progress Notes (Signed)
 Rounding Note    Patient Name: Brandon [name] Date of Encounter: 04/24/2023   [facility] Cardiologist: Lynwood Schilling, MD   Subjective   83 year old gentleman with end-stage congestive heart failure.  He was admitted with cardiogenic shock.  He has been diuresed.  His creatinine remains very elevated.  He is not a candidate for any advanced CHF therapies and is not a candidate for intermittent dialysis.  He was not accepted to inpatient hospice   Creatinine is 3.36.  He has been seen by the palliative care nurse practitioner.   Continues to diurese I/O are -15 liters so far this admission     Inpatient Medications    Scheduled Meds:  amiodarone   200 mg Oral BID   gabapentin   300 mg Oral QHS   mometasone -formoterol   2 puff Inhalation BID   montelukast   10 mg Oral QHS   sodium chloride  flush  10-40 mL Intracatheter Q12H   torsemide   40 mg Oral Daily   Continuous Infusions:  PRN Meds: acetaminophen  **OR** acetaminophen , antiseptic oral rinse, feeding supplement, glycopyrrolate  **OR** glycopyrrolate  **OR** glycopyrrolate , haloperidol  **OR** haloperidol  **OR** haloperidol  lactate, HYDROmorphone  (DILAUDID ) injection, LORazepam  **OR** LORazepam  **OR** LORazepam , ondansetron  **OR** ondansetron  (ZOFRAN ) IV, mouth rinse, polyvinyl alcohol , sodium chloride  flush   Vital Signs    Vitals:   04/24/23 0002 04/24/23 0408 04/24/23 0727 04/24/23 1130  BP: 106/72 101/80 103/80 108/80  Pulse: 66 63 70 73  Resp: 20 20 18 18   Temp: 97.8 F (36.6 C) 98.6 F (37 C) 98.9 F (37.2 C) 98.4 F (36.9 C)  TempSrc: Oral Oral Oral Oral  SpO2: 100% 100% 91% 91%  Weight:  78 kg    Height:        Intake/Output Summary (Last 24 hours) at 04/24/2023 1555 Last data filed at 04/24/2023 1134 Gross per 24 hour  Intake 480 ml  Output 1700 ml  Net -1220 ml      04/24/2023    4:08 AM 04/23/2023    4:12 AM 04/22/2023    5:28 AM  Last 3 Weights  Weight (lbs) 171 lb 14.4 oz 172 lb 6.4 oz  174 lb 13.2 oz  Weight (kg) 77.973 kg 78.2 kg 79.3 kg      Telemetry    NSR   - Personally Reviewed  ECG     - Personally Reviewed  Physical Exam   Physical Exam: Blood pressure 108/80, pulse 73, temperature 98.4 F (36.9 C), temperature source Oral, resp. rate 18, height 5' 11 (1.803 m), weight 78 kg, SpO2 91%.       GEN:  elderly , very pleasant man  in no acute distress HEENT: Normal NECK: No JVD; No carotid bruits LYMPHATICS: No lymphadenopathy CARDIAC: RRR , no murmurs, rubs, gallops RESPIRATORY:  Clear to auscultation without rales, wheezing or rhonchi  ABDOMEN: Soft, non-tender, non-distended MUSCULOSKELETAL:  No edema; No deformity  SKIN: Warm and dry NEUROLOGIC:  Alert and oriented x 3   Labs    High Sensitivity Troponin:   Recent Labs  Lab 04/15/23 1627 04/15/23 1841  TROPONINIHS 63* 57*     Chemistry Recent Labs  Lab 04/18/23 0445 04/18/23 1517 04/19/23 0357 04/19/23 0358 04/20/23 0452 04/21/23 0228 04/22/23 0633 04/23/23 0527  NA 132*   < >  --  128* 128* 130* 128* 132*  K 4.0   < >  --  3.8 3.6 4.1 3.9 3.9  CL 91*   < >  --  83* 81* 88* 84* 86*  CO2  28   < >  --  28 29 28 28 28   GLUCOSE 121*   < >  --  322* 285* 122* 197* 104*  BUN 74*   < >  --  75* 77* 88* 83* 80*  CREATININE 3.29*   < >  --  3.12* 3.19* 3.59* 3.63* 3.36*  CALCIUM  9.5   < >  --  9.1 9.3 9.5 9.4 10.5*  MG 2.7*  --   --  2.3 2.1 2.2  --   --   PROT 6.1*  --  5.9*  --   --   --   --   --   ALBUMIN 3.3*  --  3.1*  --   --   --   --   --   AST 191*  --  97*  --   --   --   --   --   ALT 1,001*  --  688*  --   --   --   --   --   ALKPHOS 128*  --  113  --   --   --   --   --   BILITOT 0.9  --  1.1  --   --   --   --   --   GFRNONAA 18*   < >  --  19* 19* 16* 16* 18*  ANIONGAP 13   < >  --  17* 18* 14 16* 18*   < > = values in this interval not displayed.    Lipids No results for input(s): CHOL, TRIG, HDL, LABVLDL, LDLCALC, CHOLHDL in the last 168 hours.   Hematology Recent Labs  Lab 04/19/23 0358 04/22/23 0633  WBC 8.2 7.7  RBC 3.76* 4.02*  HGB 11.9* 12.7*  HCT 35.6* 37.6*  MCV 94.7 93.5  MCH 31.6 31.6  MCHC 33.4 33.8  RDW 15.7* 15.1  PLT 143* 125*   Thyroid  No results for input(s): TSH, FREET4 in the last 168 hours.  BNPNo results for input(s): BNP, PROBNP in the last 168 hours.  DDimer No results for input(s): DDIMER in the last 168 hours.   Radiology    No results found.  Cardiac Studies      Patient Profile     83 y.o. male   Assessment & Plan    End-stage CHF: Holdan has an EF of 20 to 25%.  RV is moderately enlarged with moderately elevated pulmonary pressures.   He continues to diurese. Will recheck BMP tomorrow  Added low dose Kdur     He is not a candidate for inpatient hospice.   Continue to follow u       Time spent on exam of patient, discussion with patient  chart review , note writing  - 30 min    For questions or updates, please contact [facility] Please consult [email or web address] for contact info under        Signed, Aleene Passe, MD  04/24/2023, 3:55 PM

## 2023-04-24 NOTE — Progress Notes (Signed)
 Daily Progress Note   Patient Name: Brandon [name]       Date: 04/24/2023 DOB: 05-17-1940  Age: 83 y.o. MRN#: [id number] Attending Physician: Zenaida Morene PARAS, MD Primary Care Physician: Daryl Setter, NP Admit Date: 04/15/2023  Reason for Consultation/Follow-up: Non pain symptom management, Pain control, Psychosocial/spiritual support, and Terminal Care  Subjective: I have reviewed medical records including EPIC notes, MAR, any available advanced directives as necessary, and labs. Received report from primary RN - no acute concerns. Discussed with RN ok to remove tele monitoring.   Went to visit patient at bedside - no family/visitors present. Patient was sitting up in chair awake and alert. He tells me he is doing pretty good today. No signs or non-verbal gestures of pain or discomfort noted. No respiratory distress, increased work of breathing, or secretions noted.   Patient is looking forward to watching the super bowl game tonight.  He remains high risk for decompensation.   All questions and concerns addressed.   Length of Stay: 9  Current Medications: Scheduled Meds:   amiodarone   200 mg Oral BID   Chlorhexidine  Gluconate Cloth  6 each Topical Daily   gabapentin   300 mg Oral QHS   mometasone -formoterol   2 puff Inhalation BID   montelukast   10 mg Oral QHS   sodium chloride  flush  10-40 mL Intracatheter Q12H   torsemide   40 mg Oral Daily    Continuous Infusions:   PRN Meds: acetaminophen  **OR** acetaminophen , antiseptic oral rinse, feeding supplement, glycopyrrolate  **OR** glycopyrrolate  **OR** glycopyrrolate , haloperidol  **OR** haloperidol  **OR** haloperidol  lactate, HYDROmorphone  (DILAUDID ) injection, LORazepam  **OR** LORazepam  **OR** LORazepam , ondansetron  **OR**  ondansetron  (ZOFRAN ) IV, mouth rinse, polyvinyl alcohol , sodium chloride  flush  Physical Exam Vitals and nursing note reviewed.  Constitutional:      General: He is not in acute distress.    Appearance: He is ill-appearing.  Pulmonary:     Effort: No respiratory distress.  Skin:    General: Skin is warm and dry.  Neurological:     Mental Status: He is alert and oriented to person, place, and time.     Motor: Weakness present.  Psychiatric:        Attention and Perception: Attention normal.        Behavior: Behavior is cooperative.        Cognition and Memory: Cognition  and memory normal.             Vital Signs: BP 103/80 (BP Location: Left Arm)   Pulse 70   Temp 98.9 F (37.2 C) (Oral)   Resp 18   Ht 5' 11 (1.803 m)   Wt 78 kg   SpO2 91%   BMI 23.98 kg/m  SpO2: SpO2: 91 % O2 Device: O2 Device: Room Air O2 Flow Rate: O2 Flow Rate (L/min): 3 L/min  Intake/output summary:  Intake/Output Summary (Last 24 hours) at 04/24/2023 0929 Last data filed at 04/24/2023 0827 Gross per 24 hour  Intake 600 ml  Output 2800 ml  Net -2200 ml   LBM: Last BM Date : 04/21/23 Baseline Weight: Weight: 89.4 kg Most recent weight: Weight: 78 kg       Palliative Assessment/Data: PPS 50%      Patient Active Problem List   Diagnosis Date Noted   Cardiogenic shock (HCC) 04/16/2023   Atrial fibrillation with RVR (HCC) 04/15/2023   Persistent atrial fibrillation (HCC) 01/14/2023   Mitral regurgitation 01/14/2023   Thrombus of left atrial appendage 01/14/2023   Acute kidney injury superimposed on chronic kidney disease (HCC) 01/14/2023   Acute on chronic systolic heart failure (HCC) 01/10/2023   Neuropathy 09/21/2022   Abnormal brain MRI 04/21/2022   Hyperlipidemia 04/21/2022   CVA (cerebral vascular accident) (HCC) 04/18/2022   Normocytic anemia 04/18/2022   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   Coronary artery calcification 03/30/2022   B12 deficiency 08/13/2020   Rheumatoid arthritis (HCC)  08/12/2020   BPH (benign prostatic hyperplasia) 08/12/2020   Folic acid  deficiency 08/12/2020   Stage 3a chronic kidney disease (HCC) 10/17/2019   Heme positive stool 02/01/2019   RBBB 10/03/2018   Depression 12/09/2015   Asthma 08/13/2009   Hyperglycemia 08/13/2009   Obstructive sleep apnea 01/23/2009   CHRONIC SYSTOLIC HEART FAILURE 09/11/2008   Obesity, unspecified 08/26/2008   Disorder resulting from impaired renal function 08/26/2008   Essential hypertension 06/28/2008   Nonischemic cardiomyopathy (HCC) 06/28/2008    Palliative Care Assessment & Plan   Patient Profile: 83 y.o. 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 He presented from primary cardiologist office on recommendation due to shortness of breath. He was admitted on 04/15/2023 with persistent atrial fibrillation (now status post DCCV after TEE), nonischemic cardiomyopathy, end-stage heart failure, group 2 pulmonary hypertension, AKI on CKD, shock liver, and others.   Assessment: Principal Problem:   Atrial fibrillation with RVR (HCC) Active Problems:   Persistent atrial fibrillation (HCC)   Cardiogenic shock (HCC)   Terminal care  Recommendations/Plan: Continue full comfort measures Continue DNR/DNI as previously documented - durable DNR form completed and placed in shadow chart Plan for [facility] re-evaluation tomorrow 2/10 Added orders for EOL symptom management and to reflect full comfort measures, as well as discontinued orders that were not focused on comfort Continue current comfort focused medication regimen - no changes PMT will continue to follow and support holistically   Symptom Management Dilaudid  PRN pain/dyspnea/increased work of breathing/RR>25 Tylenol  PRN pain/fever Biotin PRN dry mouth Robinul  PRN secretions Haldol  PRN agitation/delirium Ativan  PRN anxiety/seizure/sleep/distress Zofran  PRN  nausea/vomiting Liquifilm Tears PRN dry eye Continue amiodarone , gabapentin , inhalers, Singulair , torsemide  for comfort     Goals of Care and Additional Recommendations: Limitations on Scope of Treatment: Full Comfort Care  Code Status:    Code Status Orders  (From admission,  onward)           Start     Ordered   04/23/23 1207  Do not attempt resuscitation (DNR) - Comfort care  Continuous       Question Answer Comment  If patient has no pulse and is not breathing Do Not Attempt Resuscitation   In Pre-Arrest Conditions (Patient Is Breathing and Has a Pulse) Provide comfort measures. Relieve any mechanical airway obstruction. Avoid transfer unless required for comfort.   Consent: Discussion documented in EHR or advanced directives reviewed      04/23/23 1207           Code Status History     Date Active Date Inactive Code Status Order ID Comments User Context   04/15/2023 2318 04/23/2023 1207 Do not attempt resuscitation (DNR) PRE-ARREST INTERVENTIONS DESIRED [id number]  Gail Laurene LABOR, MD ED   01/10/2023 1625 01/15/2023 1613 Full Code [id number]  Jerilynn Lamarr HERO, NP Inpatient   04/18/2022 0730 04/20/2022 0133 Full Code [id number]  Claudene Maximino LABOR, MD ED       Prognosis:  Poor  Discharge Planning: Hospice facility  Care plan was discussed with primary RN, patient  Thank you for allowing the Palliative Medicine Team to assist in the care of this patient.     Jeoffrey HERO Claudene, NP  Please contact Palliative Medicine Team phone at [phone] for questions and concerns.   *Portions of this note are a verbal dictation therefore any spelling and/or grammatical errors are due to the Dragon Medical One system interpretation.

## 2023-04-25 ENCOUNTER — Ambulatory Visit: Payer: Medicare Other | Admitting: Student

## 2023-04-25 DIAGNOSIS — I4819 Other persistent atrial fibrillation: Secondary | ICD-10-CM | POA: Diagnosis not present

## 2023-04-25 DIAGNOSIS — Z7189 Other specified counseling: Secondary | ICD-10-CM | POA: Diagnosis not present

## 2023-04-25 DIAGNOSIS — Z515 Encounter for palliative care: Secondary | ICD-10-CM | POA: Diagnosis not present

## 2023-04-25 DIAGNOSIS — I4891 Unspecified atrial fibrillation: Secondary | ICD-10-CM | POA: Diagnosis not present

## 2023-04-25 LAB — BASIC METABOLIC PANEL
Anion gap: 13 (ref 5–15)
BUN: 79 mg/dL — ABNORMAL HIGH (ref 8–23)
CO2: 28 mmol/L (ref 22–32)
Calcium: 10.1 mg/dL (ref 8.9–10.3)
Chloride: 88 mmol/L — ABNORMAL LOW (ref 98–111)
Creatinine, Ser: 3.41 mg/dL — ABNORMAL HIGH (ref 0.61–1.24)
GFR, Estimated: 17 mL/min — ABNORMAL LOW (ref >60)
Glucose, Bld: 114 mg/dL — ABNORMAL HIGH (ref 70–99)
Potassium: 3.8 mmol/L (ref 3.5–5.1)
Sodium: 129 mmol/L — ABNORMAL LOW (ref 135–145)

## 2023-04-25 NOTE — Progress Notes (Signed)
  This pt was referred for hospice services. I visited the room hoping to speak with family. However, the wife was at home due to not feeling well today. I was able to speak with her over the phone. She was introduced to hospice services and they continue to want to see if pt meets criteria for our hospice facility. I reviewed chart and discussed with our hospice MD at the hospice home in [place]. Unfortunately our MD does not feel he meets the criteria for our hospice facility.   I updated the pt's wife, Brandon [name], about this. I offered suggestion of taking him home with hospice care and would that be an option with the additional support of hospice. She reports that she still works on a daily basis. She is self employed. She reports there is no means to afford or pay out of pocket for additional services when she is working. She is requesting information about a skilled facility. I have let her know I would reach out so that [facility] can contact them to discuss more in depth this and if he meets the criteria for SNF.   I will continue to follow and will assist with d/c plan if needed and if he does further decline and become appropriate for our facility update everyone.   Brandon Samuels RN [phone]

## 2023-04-25 NOTE — Progress Notes (Signed)
 VAST consult regarding PICC line and swelling to R arm. Secure chat sent including Dr. Alease Amend and Dina Francisco RN regarding PICC line and POC. DC PICC line order received. Currently patient is up in the chair. Requested RN to place consult when patient is back in bed. RN VU. Theophilus Fitz, RN VAST

## 2023-04-25 NOTE — TOC Progression Note (Addendum)
 Transition of Care [facility]) - Progression Note    Patient Details  Name: Brandon [name] MRN: [id number] Date of Birth: 1940-11-30  Transition of Care [facility]) CM/SW Contact  Benjiman Bras, RN Phone Number: [phone] 04/25/2023, 11:08 AM  Clinical Narrative:    Referral was sent to [facility] by Palliative team. [facility] will follow for Residential [facility]ice. Will complete assessment to see if patient is eligible for IP Hospice.   Message received from Hospice rep, pt not appropriate for Residential, will follow for Home Hospice set up.   Wife wants to see if pt can go to SNF rehab, will need PT evaluation and recommendations. Wife states she works full-time and is sole provider in the home. Message sent to attending, Hospice rep and HF TOC CSW. Wife gave permission to create [facility] and fax referral to SNF rehab.    Expected Discharge Plan: Home/Self Care Barriers to Discharge: Continued Medical Work up  Expected Discharge Plan and Services   Discharge Planning Services: CM Consult Post Acute Care Choice: Hospice Living arrangements for the past 2 months: Single Family Home                           HH Arranged: RN [facility] Agency: [facility] Date HH Agency Contacted: 04/25/23 Time HH Agency Contacted: 1101 Representative spoke with at [facility] Agency: Lyla Samuels   Social Determinants of Health (SDOH) Interventions SDOH Screenings   Food Insecurity: No Food Insecurity (04/16/2023)  Housing: Low Risk  (04/17/2023)  Transportation Needs: No Transportation Needs (04/16/2023)  Utilities: Not At Risk (04/16/2023)  Alcohol  Screen: Low Risk  (07/01/2022)  Depression (PHQ2-9): Low Risk  (02/03/2023)  Financial Resource Strain: Low Risk  (01/13/2023)  Physical Activity: Insufficiently Active (07/25/2020)  Social Connections: Socially Integrated (04/16/2023)  Stress: No Stress Concern Present (05/28/2021)  Tobacco Use: Medium Risk (04/19/2023)  Health  Literacy: Adequate Health Literacy (01/27/2023)    Readmission Risk Interventions     No data to display

## 2023-04-25 NOTE — Progress Notes (Signed)
 Arrived to patient room. Educated on procedure. HOB less than 45*. Held breath with line removal. Pressure held for d/t risk of bleeding, pressure drsg applied and instructed to remain in bed for 24 hrs. And to remain in bed fro 30 min. Instructed to report and s/sx of bleeding. Pt VU. Notified nurse PICC line was dc. VU. Theophilus Fitz, RN VAST

## 2023-04-25 NOTE — TOC Progression Note (Addendum)
 Transition of Care [facility]) - Progression Note    Patient Details  Name: Brandon [name] MRN: [id number] Date of Birth: April 15, 1940  Transition of Care [facility]) CM/SW Contact  Justina Delcia Czar, RN Phone Number: [phone] 04/25/2023, 11:01 AM  Clinical Narrative:     HF TOC CM spoke to pt's wife. States she is wanting Home Hospice or Residential Hospice with [facility]. States she wants to discuss with her husband prior to making final decision.   Wife had questions about who will make the decision, if patient is able to make the decision, he will be able to make decision with wife and family.  CM will continue to follow for dc needs.   Expected Discharge Plan: Home/Self Care Barriers to Discharge: Continued Medical Work up  Expected Discharge Plan and Services   Discharge Planning Services: CM Consult Post Acute Care Choice: Hospice Living arrangements for the past 2 months: Single Family Home                           HH Arranged: RN [facility] Agency: [facility] Date HH Agency Contacted: 04/25/23 Time HH Agency Contacted: 1101 Representative spoke with at [facility] Agency: Magdalena Berber   Social Determinants of Health (SDOH) Interventions SDOH Screenings   Food Insecurity: No Food Insecurity (04/16/2023)  Housing: Low Risk  (04/17/2023)  Transportation Needs: No Transportation Needs (04/16/2023)  Utilities: Not At Risk (04/16/2023)  Alcohol  Screen: Low Risk  (07/01/2022)  Depression (PHQ2-9): Low Risk  (02/03/2023)  Financial Resource Strain: Low Risk  (01/13/2023)  Physical Activity: Insufficiently Active (07/25/2020)  Social Connections: Socially Integrated (04/16/2023)  Stress: No Stress Concern Present (05/28/2021)  Tobacco Use: Medium Risk (04/19/2023)  Health Literacy: Adequate Health Literacy (01/27/2023)    Readmission Risk Interventions     No data to display

## 2023-04-25 NOTE — TOC Progression Note (Signed)
 Transition of Care [facility]) - Progression Note    Patient Details  Name: Brandon [name] MRN: [id number] Date of Birth: 07/09/1940  Transition of Care [facility]) CM/SW Contact  [name] Phone Number: [phone] 04/25/2023, 11:26 AM  Clinical Narrative:   HF CSW met with pt at bedside. Pt stated that he was feeling "pretty good." Pt stated that he does not have any questions or need anything at this time. Will continue to follow and monitor.   TOC will continue following.     Expected Discharge Plan: Home/Self Care Barriers to Discharge: Continued Medical Work up  Expected Discharge Plan and Services   Discharge Planning Services: CM Consult Post Acute Care Choice: Hospice Living arrangements for the past 2 months: Single Family Home                           HH Arranged: RN [facility] Agency: [facility] Date HH Agency Contacted: 04/25/23 Time HH Agency Contacted: 1101 Representative spoke with at [facility] Agency: Lyla Samuels   Social Determinants of Health (SDOH) Interventions SDOH Screenings   Food Insecurity: No Food Insecurity (04/16/2023)  Housing: Low Risk  (04/17/2023)  Transportation Needs: No Transportation Needs (04/16/2023)  Utilities: Not At Risk (04/16/2023)  Alcohol  Screen: Low Risk  (07/01/2022)  Depression (PHQ2-9): Low Risk  (02/03/2023)  Financial Resource Strain: Low Risk  (01/13/2023)  Physical Activity: Insufficiently Active (07/25/2020)  Social Connections: Socially Integrated (04/16/2023)  Stress: No Stress Concern Present (05/28/2021)  Tobacco Use: Medium Risk (04/19/2023)  Health Literacy: Adequate Health Literacy (01/27/2023)    Readmission Risk Interventions     No data to display

## 2023-04-25 NOTE — Plan of Care (Signed)
  Problem: Cardiac: Goal: Ability to achieve and maintain adequate cardiopulmonary perfusion will improve Outcome: Progressing   Problem: Health Behavior/Discharge Planning: Goal: Ability to safely manage health-related needs after discharge will improve Outcome: Progressing   Problem: Nutrition: Goal: Adequate nutrition will be maintained Outcome: Progressing

## 2023-04-25 NOTE — Progress Notes (Signed)
 Daily Progress Note   Patient Name: Brandon [name]       Date: 04/25/2023 DOB: [birth year]  Age: 83 y.o. MRN#: [id number] Attending Physician: Lauralee Poll, MD Primary Care Physician: Dorrene Gaucher, NP Admit Date: 04/15/2023  Reason for Consultation/Follow-up: Non pain symptom management, Pain control, Psychosocial/spiritual support, and Terminal Care  Subjective: I have reviewed medical records including EPIC notes, MAR, any available advanced directives as necessary, and labs. Unable to speak with primary RN.   Went to visit patient at bedside - no family/visitors present. Patient was lying in bed asleep - did not attempt to wake him to preserve comfort. No signs or non-verbal gestures of pain or discomfort noted. No respiratory distress, increased work of breathing, or secretions noted.   Communicated with TOC and [facility] liaison - hospice to re-evaluate for inpatient facility today.  1:15 PM Received notification patient's spouse called PMT phone with request to return call.  1:30 PM Returned [name]'s call - no answer - confidential voicemail left.  1:40 PM Attempted to call [name] - was able to reach. Emotional support provided. Answered questions regarding plan and goals for next steps. She has been in communication with [facility] this morning. Informed her we are still waiting to see if he was accepted to their facility reviewed patient would remain in house under comfort measures in the meantime. Reviewed discharge process per her request if patient is accepted.   All questions and concerns addressed. Encouraged to call with questions and/or concerns. PMT card previously provided.   Length of Stay: 10  Current Medications: Scheduled Meds:    amiodarone   200 mg Oral BID   gabapentin   300 mg Oral QHS   mometasone -formoterol   2 puff Inhalation BID   montelukast   10 mg Oral QHS   potassium chloride   10 mEq Oral Daily   sodium chloride  flush  10-40 mL Intracatheter Q12H   torsemide   40 mg Oral Daily    Continuous Infusions:   PRN Meds: acetaminophen  **OR** acetaminophen , antiseptic oral rinse, feeding supplement, glycopyrrolate  **OR** glycopyrrolate  **OR** glycopyrrolate , haloperidol  **OR** haloperidol  **OR** haloperidol  lactate, HYDROmorphone  (DILAUDID ) injection, LORazepam  **OR** LORazepam  **OR** LORazepam , ondansetron  **OR** ondansetron  (ZOFRAN ) IV, mouth rinse, polyvinyl alcohol , sodium chloride  flush  Physical Exam Vitals and nursing note reviewed.  Constitutional:  General: He is not in acute distress.    Appearance: He is ill-appearing.  Pulmonary:     Effort: No respiratory distress.  Skin:    General: Skin is warm and dry.  Neurological:     Motor: Weakness present.     Comments: asleep             Vital Signs: BP 113/85 (BP Location: Left Arm)   Pulse 72   Temp 98.8 F (37.1 C) (Oral)   Resp 18   Ht 5\' 11"  (1.803 m)   Wt 78 kg   SpO2 95%   BMI 23.98 kg/m  SpO2: SpO2: 95 % O2 Device: O2 Device: Room Air O2 Flow Rate: O2 Flow Rate (L/min): 3 L/min  Intake/output summary:  Intake/Output Summary (Last 24 hours) at 04/25/2023 1005 Last data filed at 04/25/2023 8657 Gross per 24 hour  Intake 480 ml  Output 1550 ml  Net -1070 ml   LBM: Last BM Date : 04/24/23 Baseline Weight: Weight: 89.4 kg Most recent weight: Weight: 78 kg       Palliative Assessment/Data: PPS 40%      Patient Active Problem List   Diagnosis Date Noted   HFrEF (heart failure with reduced ejection fraction) (HCC) 04/24/2023   Cardiogenic shock (HCC) 04/16/2023   Atrial fibrillation with RVR (HCC) 04/15/2023   Persistent atrial fibrillation (HCC) 01/14/2023   Mitral regurgitation 01/14/2023   Thrombus of left atrial  appendage 01/14/2023   Acute kidney injury superimposed on chronic kidney disease (HCC) 01/14/2023   Acute on chronic systolic heart failure (HCC) 01/10/2023   Neuropathy 09/21/2022   Abnormal brain MRI 04/21/2022   Hyperlipidemia 04/21/2022   CVA (cerebral vascular accident) (HCC) 04/18/2022   Normocytic anemia 04/18/2022   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   Coronary artery calcification 03/30/2022   B12 deficiency 08/13/2020   Rheumatoid arthritis (HCC) 08/12/2020   BPH (benign prostatic hyperplasia) 08/12/2020   Folic acid  deficiency 08/12/2020   Stage 3a chronic kidney disease (HCC) 10/17/2019   Heme positive stool 02/01/2019   RBBB 10/03/2018   Depression 12/09/2015   Asthma 08/13/2009   Hyperglycemia 08/13/2009   Obstructive sleep apnea 01/23/2009   CHRONIC SYSTOLIC HEART FAILURE 09/11/2008   Obesity, unspecified 08/26/2008   Disorder resulting from impaired renal function 08/26/2008   Essential hypertension 06/28/2008   Nonischemic cardiomyopathy (HCC) 06/28/2008    Palliative Care Assessment & Plan   Patient Profile: 83 y.o. 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 He presented from primary cardiologist office on recommendation due to shortness of breath. He was admitted on 04/15/2023 with persistent atrial fibrillation (now status post DCCV after TEE), nonischemic cardiomyopathy, end-stage heart failure, group 2 pulmonary hypertension, AKI on CKD, shock liver, and others.   Assessment: Principal Problem:   Atrial fibrillation with RVR (HCC) Active Problems:   Persistent atrial fibrillation (HCC)   Cardiogenic shock (HCC)   HFrEF (heart failure with reduced ejection fraction) (HCC)   Terminal care  Recommendations/Plan: Continue full comfort measures Continue DNR/DNI as previously documented  [facility] to re-evaluate eligibility for inpatient facility today Continue  current comfort focused medication regimen - no changes PMT will continue to follow and support holistically     Symptom Management Dilaudid  PRN pain/dyspnea/increased work of breathing/RR>25 Tylenol  PRN pain/fever Biotin PRN dry mouth Robinul  PRN secretions Haldol  PRN agitation/delirium Ativan  PRN anxiety/seizure/sleep/distress Zofran  PRN nausea/vomiting Liquifilm Tears PRN  dry eye Continue amiodarone , gabapentin , inhalers, Singulair , torsemide  for comfort    Goals of Care and Additional Recommendations: Limitations on Scope of Treatment: Full Comfort Care  Code Status:    Code Status Orders  (From admission, onward)           Start     Ordered   04/23/23 1207  Do not attempt resuscitation (DNR) - Comfort care  Continuous       Question Answer Comment  If patient has no pulse and is not breathing Do Not Attempt Resuscitation   In Pre-Arrest Conditions (Patient Is Breathing and Has a Pulse) Provide comfort measures. Relieve any mechanical airway obstruction. Avoid transfer unless required for comfort.   Consent: Discussion documented in EHR or advanced directives reviewed      04/23/23 1207           Code Status History     Date Active Date Inactive Code Status Order ID Comments User Context   04/15/2023 2318 04/23/2023 1207 Do not attempt resuscitation (DNR) PRE-ARREST INTERVENTIONS DESIRED [id number]  Gale Jude, MD ED   01/10/2023 1625 01/15/2023 1613 Full Code [id number]  Tania Familia, NP Inpatient   04/18/2022 0730 04/20/2022 0133 Full Code [id number]  Lena Qualia, MD ED       Prognosis:  Poor  Discharge Planning: Hospice facility  Care plan was discussed with [facility] and hospice liaison  Thank you for allowing the Palliative Medicine Team to assist in the care of this patient.     Annette Killings, NP  Please contact Palliative Medicine Team phone at [phone] for questions and concerns.   *Portions of this note are a verbal dictation  therefore any spelling and/or grammatical errors are due to the "Dragon Medical One" system interpretation.

## 2023-04-25 NOTE — Discharge Summary (Signed)
Advanced Heart Failure Team  Discharge Summary   Patient ID: Brandon [name] MRN: [id number], DOB/AGE: 08/12/1940 83 y.o. 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 Placed on lasix gtt w/ milrinone support for palliative diuresis. He diuresed well and was weaned off milrinone and underwent successful DCCV the same day of milrinone wean, on 2/5. On 2/6, co-ox had dropped again into the 40s, suspected to be 2/2 cardiac stunning post cardioversion and he was restarted on low dose milrinone support for stabilization but ultimately weaned off again, as he was felt not to be a candidate for advanced therapies given age, fragility and baseline renal function. Given end-stage HF and progressive cardiorenal syndrome, palliative care team was consulted for GOC discussion. Patient and family opted to transition to comfort care. He was made DNR. Palliative care team followed along for comfort/ symptom management. He was continued on amiodarone, gabapentin, inhalers, Singulair and torsemide for comfort. All other meds were discontinued.   Pt was discharged to nursing facility w/ plans for outpatient palliative care to follow. Can transition to residential hospice once appropriate.   He was last seen and examined by Dr. Gasper Lloyd on 04/28/23 who agreed he was medically stable for discharge.       Discharge Vitals: Blood pressure 124/77, pulse 69, temperature 98.9 F (37.2 C), temperature source Oral, resp. rate 18, height 5\' 11"  (1.803 m), weight  78 kg, SpO2 98%.  Labs: Lab Results  Component Value Date   WBC 7.7 04/22/2023   HGB 12.7 (L) 04/22/2023   HCT 37.6 (L) 04/22/2023   MCV 93.5 04/22/2023   PLT 125 (L) 04/22/2023    Recent Labs  Lab 04/28/23 0252  NA 129*  K 4.6  CL 93*  CO2 24  BUN 88*  CREATININE 3.32*  CALCIUM 9.5  GLUCOSE 106*   Lab Results  Component Value Date   CHOL 112 04/16/2023   HDL 38 (L) 04/16/2023   LDLCALC 61 04/16/2023   TRIG 66 04/16/2023   BNP (last 3 results) Recent Labs    04/15/23 1627  BNP 2,612.3*    ProBNP (last 3 results) No results for input(s): "PROBNP" in the last 8760 hours.   Diagnostic Studies/Procedures   No results found.  Discharge Medications   Allergies as of 04/28/2023       Reactions   Ace Inhibitors Cough   Isosorb Dinitrate-hydralazine Other (See Comments)   dizziness/hypotensive        Medication List     STOP taking these medications    apixaban 2.5 MG Tabs tablet Commonly known as: ELIQUIS   cyanocobalamin 1000 MCG tablet Commonly known as: VITAMIN B12   empagliflozin 10 MG Tabs tablet Commonly known as: JARDIANCE   folic acid 1 MG tablet Commonly known as: FOLVITE   leflunomide 20 MG tablet Commonly known as: ARAVA   metoprolol succinate 25 MG 24 hr tablet Commonly known as: TOPROL-XL   montelukast 10 MG tablet Commonly known as: SINGULAIR   omeprazole 20 MG  capsule Commonly known as: PRILOSEC   rosuvastatin 10 MG tablet Commonly known as: CRESTOR   tamsulosin 0.4 MG Caps capsule Commonly known as: FLOMAX       TAKE these medications    albuterol (2.5 MG/3ML) 0.083% nebulizer solution Commonly known as: PROVENTIL TAKE 3 MLS BY NEBULIZER EVERY 6 HOURS AS NEEDED FOR WHEEZING FOR SHORTNESS OF BREATH What changed: Another medication with the same name was removed. Continue taking this medication, and follow the directions you see here.   amiodarone 200 MG tablet Commonly known as: PACERONE Take 1 tablet (200  mg total) by mouth daily.   fluticasone-salmeterol 500-50 MCG/ACT Aepb Commonly known as: ADVAIR Inhale 1 puff into the lungs in the morning and at bedtime. What changed: how much to take   gabapentin 300 MG capsule Commonly known as: NEURONTIN Take 1 capsule (300 mg total) by mouth at bedtime.   torsemide 20 MG tablet Commonly known as: DEMADEX Take 2 tablets (40 mg total) by mouth daily. What changed: additional instructions        Disposition   The patient will be discharged in stable condition to home. Discharge Instructions     Amb referral to [facility]   Complete by: As directed        Follow-up Information     [facility] Follow up.   Why: Home Hospice- agency will call to arrange admission visit Contact information: [address] [facility] [address] [phone]                  Duration of Discharge Encounter: Greater than 35 minutes   Signed, Knute Neu  04/28/2023, 10:14 AM

## 2023-04-25 NOTE — Progress Notes (Signed)
 Advanced Heart Failure Rounding Note  Cardiologist: Eilleen Grates, MD  Chief Complaint: Cardiogenic Shock     Patient Profile   83 y/o M w/ h/o Stage D systolic HF 2/2 NICM, PAF, HTN, CKD, COPD, pulmonary HTN, OSA not on CPAP and h/o CVA admitted w/ cardiogenic shock in the setting of recurrent atrial fibrillation.     Subjective:     2/5 Milrinone  and lasix  gtt discontinued. TEE/DCCV EF 10-15%, Cardioverted x 2>>NSR. Failed Milrinone  wean, 0.125 of milrinone  restarted. 2/7: Milrinone  discontinued   No longer follow co-oxes.  Na 129. SCr 3.41. BUN 79. K 3.8. Patient and family planning transition to hospice soon. Interested in [facility]. PC team following.  He has no complaints today. Sitting up in bed. Eating breakfast. Appetite is good. Denies dyspnea. No chest pain.    Objective:    Weight Range: 78 kg Body mass index is 23.98 kg/m.   Vital Signs:   Temp:  [98.4 F (36.9 C)] 98.4 F (36.9 C) (02/09 1130) Pulse Rate:  [73] 73 (02/09 1130) Resp:  [18] 18 (02/09 1130) BP: (108)/(80) 108/80 (02/09 1130) SpO2:  [91 %] 91 % (02/09 1130) Last BM Date : 04/24/23  Weight change: Filed Weights   04/22/23 0528 04/23/23 0412 04/24/23 0408  Weight: 79.3 kg 78.2 kg 78 kg    Intake/Output:   Intake/Output Summary (Last 24 hours) at 04/25/2023 0741 Last data filed at 04/24/2023 1828 Gross per 24 hour  Intake 240 ml  Output 1100 ml  Net -860 ml    Physical Exam    General: fatigued appearing. No respiratory difficulty HEENT: normal Neck: supple. JVD 6 cm. Carotids 2+ bilat; no bruits. No lymphadenopathy or thyromegaly appreciated. Cor: PMI nondisplaced. Regular rate & rhythm. No rubs, gallops or murmurs. Lungs: clear Abdomen: soft, nontender, nondistended. No hepatosplenomegaly. No bruits or masses. Good bowel sounds. Extremities: no cyanosis, clubbing, rash, edema + RUE PICC  Neuro: alert & oriented x 3, cranial nerves grossly intact. moves all 4  extremities w/o difficulty. Affect pleasant.   Telemetry   N/A   Labs    CBC No results for input(s): "WBC", "NEUTROABS", "HGB", "HCT", "MCV", "PLT" in the last 72 hours.  Basic Metabolic Panel Recent Labs    29/52/84 0527 04/25/23 0320  NA 132* 129*  K 3.9 3.8  CL 86* 88*  CO2 28 28  GLUCOSE 104* 114*  BUN 80* 79*  CREATININE 3.36* 3.41*  CALCIUM  10.5* 10.1   Liver Function Tests No results for input(s): "AST", "ALT", "ALKPHOS", "BILITOT", "PROT", "ALBUMIN" in the last 72 hours.  No results for input(s): "LIPASE", "AMYLASE" in the last 72 hours. Cardiac Enzymes No results for input(s): "CKTOTAL", "CKMB", "CKMBINDEX", "TROPONINI" in the last 72 hours.  BNP: BNP (last 3 results) Recent Labs    04/15/23 1627  BNP 2,612.3*   ProBNP (last 3 results) No results for input(s): "PROBNP" in the last 8760 hours.  D-Dimer No results for input(s): "DDIMER" in the last 72 hours. Hemoglobin A1C No results for input(s): "HGBA1C" in the last 72 hours. Fasting Lipid Panel No results for input(s): "CHOL", "HDL", "LDLCALC", "TRIG", "CHOLHDL", "LDLDIRECT" in the last 72 hours.  Thyroid  Function Tests No results for input(s): "TSH", "T4TOTAL", "T3FREE", "THYROIDAB" in the last 72 hours.  Invalid input(s): "FREET3"  Other results:  Medications:    Scheduled Medications:  amiodarone   200 mg Oral BID   gabapentin   300 mg Oral QHS   mometasone -formoterol   2 puff Inhalation  BID   montelukast   10 mg Oral QHS   potassium chloride   10 mEq Oral Daily   sodium chloride  flush  10-40 mL Intracatheter Q12H   torsemide   40 mg Oral Daily    Infusions:    PRN Medications: acetaminophen  **OR** acetaminophen , antiseptic oral rinse, feeding supplement, glycopyrrolate  **OR** glycopyrrolate  **OR** glycopyrrolate , haloperidol  **OR** haloperidol  **OR** haloperidol  lactate, HYDROmorphone  (DILAUDID ) injection, LORazepam  **OR** LORazepam  **OR** LORazepam , ondansetron  **OR** ondansetron   (ZOFRAN ) IV, mouth rinse, polyvinyl alcohol , sodium chloride  flush  Assessment/Plan   Cardiogenic shock: Presented with elevated lactate, hepatic congestion, borderline BP in the setting of recurrent atrial fibrillation. ACC/AHA Stage D end stage cardiomyopathy. Not an advanced therapies candidate given age. Placed on milrinone  and lasix  gtt for palliative diuresis. S/p successful DCCV 2/4 and maintaining NSR but failed milrinone  wean. Co-ox post cardioversion 48%. Restarted on low dose milrinone  at 0.125 w/ improvement in co-ox. Milrinone  stopped again 2/7. Now planning transition to hospice care. We are no longer checking co-oxes. He appears comfortable and euvolemic. He denies resting dyspnea. 1L in UOP yesterday. SCr and wt stable.  - continue torsemide  40 mg daily  - continue holding jardiance  - other GDMT limited by CKD and soft BP   Atrial fibrillation: Unclear if primary cause or secondary to his decompensation. S/p DCCV 2/4, took 2 attempts. RRR on exam. No longer on tele (comfort care)  - continue PO amio 200 mg bid - no longer on a/c (Eliquis  discontinued) comfort meds only   Shock liver: LFTs elevated on arrival, secondary to congestion and shock.  - improved with trreatment of shock and diuresis  AKI on CKD IIIb: Secondary to cardiorenal syndrome and shock. - b/l SCr 2.7 - SCr 3.2>3.6>3.41 today. - non oliguric   Hyperkalemia:  - Resolved, K 3.8 today   COPD:  - Home inhalers   Planning transition to hospice care. Pt to met w/ [facility] today.   Length of Stay: [address], PA-C  04/25/2023, 7:41 AM  Advanced Heart Failure Team Pager [phone] (M-F; 7a - 5p)  Please contact [facility] for night-coverage after hours (5p -7a ) and weekends on [email or web address]

## 2023-04-26 DIAGNOSIS — I4891 Unspecified atrial fibrillation: Secondary | ICD-10-CM | POA: Diagnosis not present

## 2023-04-26 DIAGNOSIS — Z7189 Other specified counseling: Secondary | ICD-10-CM | POA: Diagnosis not present

## 2023-04-26 DIAGNOSIS — Z515 Encounter for palliative care: Secondary | ICD-10-CM | POA: Diagnosis not present

## 2023-04-26 DIAGNOSIS — Z66 Do not resuscitate: Secondary | ICD-10-CM | POA: Diagnosis not present

## 2023-04-26 LAB — BASIC METABOLIC PANEL
Anion gap: 12 (ref 5–15)
BUN: 83 mg/dL — ABNORMAL HIGH (ref 8–23)
CO2: 27 mmol/L (ref 22–32)
Calcium: 9.5 mg/dL (ref 8.9–10.3)
Chloride: 91 mmol/L — ABNORMAL LOW (ref 98–111)
Creatinine, Ser: 3.49 mg/dL — ABNORMAL HIGH (ref 0.61–1.24)
GFR, Estimated: 17 mL/min — ABNORMAL LOW (ref >60)
Glucose, Bld: 116 mg/dL — ABNORMAL HIGH (ref 70–99)
Potassium: 3.9 mmol/L (ref 3.5–5.1)
Sodium: 130 mmol/L — ABNORMAL LOW (ref 135–145)

## 2023-04-26 MED ORDER — AMIODARONE HCL 200 MG PO TABS
200.0000 mg | ORAL_TABLET | Freq: Every day | ORAL | Status: DC
  Administered 2023-04-27 – 2023-04-28 (×2): 200 mg via ORAL
  Filled 2023-04-26 (×2): qty 1

## 2023-04-26 MED ORDER — AMIODARONE HCL 200 MG PO TABS
200.0000 mg | ORAL_TABLET | Freq: Two times a day (BID) | ORAL | Status: AC
  Administered 2023-04-26 (×2): 200 mg via ORAL
  Filled 2023-04-26 (×2): qty 1

## 2023-04-26 NOTE — Evaluation (Signed)
Physical Therapy Evaluation Patient Details Name: Brandon [name] MRN: [id number] DOB: 01-11-1941 Today's Date: 04/26/2023  History of Present Illness  Pt is 83 y.o. male admitted 04/15/23 with cardiogenic shock in the setting of recurrent atrial fibrillation. 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   Clinical Impression  Pt admitted with above diagnosis. PTA, pt reside in a two story house with his wife, ambulated without an AD, drove, and was independent with functional mobility and ADLs. Pt currently with functional limitations due to the deficits listed below (see PT Problem List). Pt required supervision for bed mobility, CGA for STS, and minA for gait using RW. Pt limited by generalized weakness and reduced activity tolerance. Pt will benefit from acute skilled PT to increase his independence and safety with mobility to allow discharge. Recommend post-acute rehab, <3 hours/day therapy given the limited family support available.      If plan is discharge home, recommend the following: A little help with walking and/or transfers;A little help with bathing/dressing/bathroom;Assistance with cooking/housework;Assist for transportation;Help with stairs or ramp for entrance   Can travel by private vehicle   Yes    Equipment Recommendations Rollator (4 wheels)  Recommendations for Other Services       Functional Status Assessment Patient has had a recent decline in their functional status and/or demonstrates limited ability to make significant improvements in function in a reasonable and predictable amount of time     Precautions / Restrictions Precautions Precautions: Fall Restrictions Weight Bearing Restrictions Per Provider Order: No      Mobility  Bed Mobility Overal bed mobility: Needs Assistance Bed Mobility: Supine to Sit     Supine to sit: Supervision, HOB elevated, Used rails     General bed mobility  comments: HOB elevated to 15deg as pt reports he is slightly inclined at home. He sat up on the R side of bed, brought LE off, sat upright, and scooted fwd with BUE support and intermittent use of handrail.    Transfers Overall transfer level: Needs assistance Equipment used: None Transfers: Sit to/from Stand Sit to Stand: Contact guard assist           General transfer comment: STS from lowest level bed height x4 reps. Pt demonstrated increased fwd flex and was slow to power up.    Ambulation/Gait Ambulation/Gait assistance: Contact guard assist, Min assist Gait Distance (Feet): 60 Feet Assistive device: Rolling walker (2 wheels) Gait Pattern/deviations: Step-through pattern, Decreased stride length, Drifts right/left, Trunk flexed Gait velocity: Decreased Gait velocity interpretation: <1.31 ft/sec, indicative of household ambulator   General Gait Details: Educated pt on proper use of RW including sequencing and positioning. Pt ambulated with a slow gait, taking short steps with adequate foot clearence, and staying inside the RW at all times. Pt drifted R/L during gait d/t unsteadiness requiring minA to stabilize and cueing to increase WBing through BUE to push AD down into the ground.  Stairs            Wheelchair Mobility     Tilt Bed    Modified Rankin (Stroke Patients Only)       Balance Overall balance assessment: Needs assistance Sitting-balance support: No upper extremity supported, Feet supported Sitting balance-Leahy Scale: Fair Sitting balance - Comments: Pt sat EOB with supervision.   Standing balance support: Bilateral upper extremity supported, During functional activity, Reliant on assistive device for balance Standing balance-Leahy Scale: Poor Standing balance  comment: Pt relied on RW for support during static standing and gait d/t unsteadiness which required intermittent minA to stabilize.                             Pertinent  Vitals/Pain Pain Assessment Pain Assessment: No/denies pain    Home Living Family/patient expects to be discharged to:: Private residence Living Arrangements: Spouse/significant other Available Help at Discharge: Available PRN/intermittently;Family (Wife and other family members work.) Type of Home: House Home Access: Stairs to enter Entrance Stairs-Rails: None Secretary/administrator of Steps: 3 Alternate Level Stairs-Number of Steps: Flight Home Layout: Two level Home Equipment: Cane - single point      Prior Function Prior Level of Function : Independent/Modified Independent             Mobility Comments: Pt reports he ambulated without an AD and was able to go/down stairs independently ADLs Comments: Independent with ADLs, drives, and manages his own medications.     Extremity/Trunk Assessment   Upper Extremity Assessment Upper Extremity Assessment: Defer to OT evaluation    Lower Extremity Assessment Lower Extremity Assessment: Generalized weakness    Cervical / Trunk Assessment Cervical / Trunk Assessment: Kyphotic  Communication   Communication Communication: No apparent difficulties    Cognition Arousal: Alert Behavior During Therapy: WFL for tasks assessed/performed   PT - Cognitive impairments: No apparent impairments                       PT - Cognition Comments: Pt A,Ox4 able to follow multiple step commands consistently. Following commands: Intact       Cueing Cueing Techniques: Verbal cues, Tactile cues     General Comments      Exercises     Assessment/Plan    PT Assessment Patient needs continued PT services  PT Problem List Decreased strength;Decreased activity tolerance;Decreased balance;Decreased mobility;Decreased coordination;Decreased knowledge of use of DME;Cardiopulmonary status limiting activity       PT Treatment Interventions DME instruction;Gait training;Stair training;Functional mobility training;Therapeutic  activities;Therapeutic exercise;Balance training;Patient/family education    PT Goals (Current goals can be found in the Care Plan section)  Acute Rehab PT Goals Patient Stated Goal: Move better and not be a burden on my wife PT Goal Formulation: With patient Time For Goal Achievement: 05/10/23 Potential to Achieve Goals: Fair    Frequency Min 1X/week     Co-evaluation               AM-PAC PT "6 Clicks" Mobility  Outcome Measure Help needed turning from your back to your side while in a flat bed without using bedrails?: A Little Help needed moving from lying on your back to sitting on the side of a flat bed without using bedrails?: A Little Help needed moving to and from a bed to a chair (including a wheelchair)?: A Little Help needed standing up from a chair using your arms (e.g., wheelchair or bedside chair)?: A Little Help needed to walk in hospital room?: A Little Help needed climbing 3-5 steps with a railing? : A Little 6 Click Score: 18    End of Session Equipment Utilized During Treatment: Gait belt Activity Tolerance: Patient limited by fatigue Patient left: in chair;with call bell/phone within reach;with chair alarm set Nurse Communication: Mobility status PT Visit Diagnosis: Unsteadiness on feet (R26.81);Other abnormalities of gait and mobility (R26.89);Muscle weakness (generalized) (M62.81)    Time: 8413-2440 PT Time Calculation (min) (ACUTE ONLY):  23 min   Charges:   PT Evaluation $PT Eval Low Complexity: 1 Low   PT General Charges $$ ACUTE PT VISIT: 1 Visit         Cheri Guppy, PT, DPT Acute Rehabilitation Services Office: [phone] Secure Chat Preferred   [name] 04/26/2023, 10:33 AM

## 2023-04-26 NOTE — Plan of Care (Signed)
Pt states that pain medication helped pain in right arm

## 2023-04-26 NOTE — [place] FL2 (Signed)
[place] MEDICAID FL2 LEVEL OF CARE FORM     IDENTIFICATION  Patient Name: Brandon [name] Birthdate: 06-19-1940 Sex: male Admission Date (Current Location): 04/15/2023  [facility] [place] Number:  Producer, television/film/video and Address:  [facility] [place], [address] [place], [place] [address]      Provider Number: [id number]  Attending Physician Name and Address:  Dorthula Nettles, DO  Relative Name and Phone Number:  Benjaman Kindler Daughter   [phone]    Current Level of Care: Hospital Recommended Level of Care: Skilled Nursing Facility Prior Approval Number:    Date Approved/Denied:   PASRR Number: [id number]  Discharge Plan: SNF    Current Diagnoses: Patient Active Problem List   Diagnosis Date Noted   HFrEF (heart failure with reduced ejection fraction) (HCC) 04/24/2023   Cardiogenic shock (HCC) 04/16/2023   Atrial fibrillation with RVR (HCC) 04/15/2023   Persistent atrial fibrillation (HCC) 01/14/2023   Mitral regurgitation 01/14/2023   Thrombus of left atrial appendage 01/14/2023   Acute kidney injury superimposed on chronic kidney disease (HCC) 01/14/2023   Acute on chronic systolic heart failure (HCC) 01/10/2023   Neuropathy 09/21/2022   Abnormal brain MRI 04/21/2022   Hyperlipidemia 04/21/2022   CVA (cerebral vascular accident) (HCC) 04/18/2022   Normocytic anemia 04/18/2022   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   Coronary artery calcification 03/30/2022   B12 deficiency 08/13/2020   Rheumatoid arthritis (HCC) 08/12/2020   BPH (benign prostatic hyperplasia) 08/12/2020   Folic acid deficiency 08/12/2020   Stage 3a chronic kidney disease (HCC) 10/17/2019   Heme positive stool 02/01/2019   RBBB 10/03/2018   Depression 12/09/2015   Asthma 08/13/2009   Hyperglycemia 08/13/2009   Obstructive sleep apnea 01/23/2009   CHRONIC SYSTOLIC HEART FAILURE 09/11/2008   Obesity, unspecified 08/26/2008   Disorder resulting from impaired renal  function 08/26/2008   Essential hypertension 06/28/2008   Nonischemic cardiomyopathy (HCC) 06/28/2008    Orientation RESPIRATION BLADDER Height & Weight     Self, Time, Situation, Place  Normal Continent Weight: 171 lb 14.4 oz (78 kg) Height:  5\' 11"  (180.3 cm)  BEHAVIORAL SYMPTOMS/MOOD NEUROLOGICAL BOWEL NUTRITION STATUS      Continent Diet (See dc summary)  AMBULATORY STATUS COMMUNICATION OF NEEDS Skin   Limited Assist Verbally Normal                       Personal Care Assistance Level of Assistance  Bathing, Feeding, Dressing, Total care Bathing Assistance: Limited assistance Feeding assistance: Limited assistance Dressing Assistance: Limited assistance Total Care Assistance: Limited assistance   Functional Limitations Info  Sight, Hearing, Speech Sight Info: Impaired Hearing Info: Adequate Speech Info: Adequate    SPECIAL CARE FACTORS FREQUENCY  PT (By licensed PT), OT (By licensed OT)     PT Frequency: 5X WEEKLY OT Frequency: 5X WEEKLY            Contractures      Additional Factors Info  Code Status, Allergies Code Status Info: DNR-comfort Allergies Info: Ace Inhibitors;Isosorb Dinitrate-hydralazine           Current Medications (04/26/2023):  This is the current hospital active medication list Current Facility-Administered Medications  Medication Dose Route Frequency Provider Last Rate Last Admin   acetaminophen (TYLENOL) tablet 650 mg  650 mg Oral Q6H PRN Anice Paganini, NP       Or   acetaminophen (TYLENOL) suppository 650 mg  650 mg Rectal Q6H PRN Anice Paganini, NP  amiodarone (PACERONE) tablet 200 mg  200 mg Oral BID Lee, [place], NP   200 mg at 04/26/23 0838   [START ON 04/27/2023] amiodarone (PACERONE) tablet 200 mg  200 mg Oral Daily Lee, [place], NP       antiseptic oral rinse (BIOTENE) solution 15 mL  15 mL Topical PRN Anice Paganini, NP       feeding supplement (ENSURE ENLIVE / ENSURE PLUS) liquid 237 mL  237 mL Oral PRN Haskel Khan,  NP       gabapentin (NEURONTIN) capsule 300 mg  300 mg Oral QHS Robbie Lis M, PA-C   300 mg at 04/25/23 2110   glycopyrrolate (ROBINUL) tablet 1 mg  1 mg Oral Q4H PRN Anice Paganini, NP       Or   glycopyrrolate (ROBINUL) injection 0.2 mg  0.2 mg Subcutaneous Q4H PRN Anice Paganini, NP       Or   glycopyrrolate (ROBINUL) injection 0.2 mg  0.2 mg Intravenous Q4H PRN Anice Paganini, NP       haloperidol (HALDOL) tablet 0.5 mg  0.5 mg Oral Q4H PRN Anice Paganini, NP       Or   haloperidol (HALDOL) 2 MG/ML solution 0.5 mg  0.5 mg Sublingual Q4H PRN Anice Paganini, NP       Or   haloperidol lactate (HALDOL) injection 0.5 mg  0.5 mg Intravenous Q4H PRN Anice Paganini, NP       HYDROmorphone (DILAUDID) injection 0.5-2 mg  0.5-2 mg Intravenous Q30 min PRN Anice Paganini, NP   0.5 mg at 04/25/23 2308   LORazepam (ATIVAN) tablet 1 mg  1 mg Oral Q4H PRN Anice Paganini, NP       Or   LORazepam (ATIVAN) 2 MG/ML concentrated solution 1 mg  1 mg Sublingual Q4H PRN Anice Paganini, NP       Or   LORazepam (ATIVAN) injection 1 mg  1 mg Intravenous Q4H PRN Anice Paganini, NP       mometasone-formoterol (DULERA) 200-5 MCG/ACT inhaler 2 puff  2 puff Inhalation BID Robbie Lis M, PA-C   2 puff at 04/26/23 0839   montelukast (SINGULAIR) tablet 10 mg  10 mg Oral QHS Robbie Lis M, PA-C   10 mg at 04/25/23 2110   ondansetron (ZOFRAN-ODT) disintegrating tablet 4 mg  4 mg Oral Q6H PRN Anice Paganini, NP       Or   ondansetron (ZOFRAN) injection 4 mg  4 mg Intravenous Q6H PRN Anice Paganini, NP       Oral care mouth rinse  15 mL Mouth Rinse PRN Robbie Lis M, PA-C       polyvinyl alcohol (LIQUIFILM TEARS) 1.4 % ophthalmic solution 1 drop  1 drop Both Eyes QID PRN Anice Paganini, NP       potassium chloride (KLOR-CON M) CR tablet 10 mEq  10 mEq Oral Daily Nahser, Deloris Ping, MD   10 mEq at 04/26/23 4782   sodium chloride flush (NS) 0.9 % injection 10-40 mL  10-40 mL Intracatheter Q12H Robbie Lis M, PA-C   10  mL at 04/26/23 9562   sodium chloride flush (NS) 0.9 % injection 10-40 mL  10-40 mL Intracatheter PRN Robbie Lis M, PA-C       torsemide [facility]) tablet 40 mg  40 mg Oral Daily Romie Minus, MD   40 mg at 04/26/23 1308     Discharge Medications: Please  see discharge summary for a list of discharge medications.  Relevant Imaging Results:  Relevant Lab Results:   Additional Information SSN: [id number]  Reva Bores, LCSWA

## 2023-04-26 NOTE — Progress Notes (Signed)
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     Subjective:    Did not meet criteria for inpatient hospice. Dispo planning per CM/SW.  Feeling well this morning. Sitting up in bed drinking coffee and reading bible. No SOB, CP or dizziness.  Objective:    Weight Range: 78 kg Body mass index is 23.98 kg/m.   Vital Signs:   Temp:  [98.3 F (36.8 C)-98.7 F (37.1 C)] 98.7 F (37.1 C) (02/11 0730) Pulse Rate:  [61-68] 68 (02/11 0730) Resp:  [18] 18 (02/11 0730) BP: (101-114)/(71-86) 110/77 (02/11 0730) SpO2:  [95 %-97 %] 96 % (02/11 0730) Last BM Date : 04/24/23  Weight change: Filed Weights   04/22/23 0528 04/23/23 0412 04/24/23 0408  Weight: 79.3 kg 78.2 kg 78 kg   Intake/Output:   Intake/Output Summary (Last 24 hours) at 04/26/2023 0801 Last data filed at 04/26/2023 0420 Gross per 24 hour  Intake 240 ml  Output 1100 ml  Net -860 ml    Physical Exam    General: Elderly appearing.  Cardiac: JVP ~7cm. S1 and S2 present. No murmurs or rub. Abdomen: Soft, non-tender, non-distended. Extremities: Warm and dry. No rash, cyanosis.  No peripheral edema.  Neuro: Alert and oriented x3. Affect pleasant. Moves all extremities without difficulty.  Telemetry   N/A   Labs    CBC No results for input(s): "WBC", "NEUTROABS", "HGB", "HCT", "MCV", "PLT" in the last 72 hours.  Basic Metabolic Panel Recent Labs    62/13/08 0320 04/26/23 0251  NA 129* 130*  K 3.8 3.9  CL 88* 91*  CO2 28 27  GLUCOSE 114* 116*  BUN 79* 83*  CREATININE 3.41* 3.49*  CALCIUM 10.1 9.5   Liver Function Tests No results for input(s): "AST", "ALT", "ALKPHOS", "BILITOT", "PROT", "ALBUMIN" in the last 72 hours.  No results  for input(s): "LIPASE", "AMYLASE" in the last 72 hours. Cardiac Enzymes No results for input(s): "CKTOTAL", "CKMB", "CKMBINDEX", "TROPONINI" in the last 72 hours.  BNP: BNP (last 3 results) Recent Labs    04/15/23 1627  BNP 2,612.3*   ProBNP (last 3 results) No results for input(s): "PROBNP" in the last 8760 hours.  D-Dimer No results for input(s): "DDIMER" in the last 72 hours. Hemoglobin A1C No results for input(s): "HGBA1C" in the last 72 hours. Fasting Lipid Panel No results for input(s): "CHOL", "HDL", "LDLCALC", "TRIG", "CHOLHDL", "LDLDIRECT" in the last 72 hours.  Thyroid Function Tests No results for input(s): "TSH", "T4TOTAL", "T3FREE", "THYROIDAB" in the last 72 hours.  Invalid input(s): "FREET3"  Other results:  Medications:    Scheduled Medications:  amiodarone  200 mg Oral BID   gabapentin  300 mg Oral QHS   mometasone-formoterol  2 puff Inhalation BID   montelukast  10 mg Oral QHS   potassium chloride  10 mEq Oral Daily   sodium chloride flush  10-40 mL Intracatheter Q12H   torsemide  40 mg Oral Daily    Infusions:    PRN Medications: acetaminophen **OR** acetaminophen, antiseptic oral rinse, feeding supplement, glycopyrrolate **OR** glycopyrrolate **OR** glycopyrrolate, haloperidol **OR** haloperidol **OR** haloperidol lactate, HYDROmorphone (DILAUDID) injection, LORazepam **OR** LORazepam **OR** LORazepam, ondansetron **OR** ondansetron (ZOFRAN) IV, mouth rinse, polyvinyl alcohol, sodium chloride  flush  Assessment/Plan   Cardiogenic shock: Presented with elevated lactate, hepatic congestion, borderline BP in the setting of recurrent atrial fibrillation. ACC/AHA Stage D end stage cardiomyopathy. Not an advanced therapies candidate given age. Placed on milrinone and lasix gtt for palliative diuresis. S/p successful DCCV 2/4 and maintaining NSR but failed milrinone wean. Co-ox post cardioversion 48%. Restarted on low dose milrinone at 0.125 w/ improvement  in co-ox. Milrinone stopped again 2/7. Transitioning to hospice care. He appears comfortable and euvolemic. He denies resting dyspnea. 1.5L in UOP yesterday. SCr and wt stable.  - continue torsemide 40 mg daily  - other GDMT limited by CKD and soft BP   Atrial fibrillation: Unclear if primary cause or secondary to his decompensation. S/p DCCV 2/4, took 2 attempts. RRR on exam. No longer on tele (comfort care)  - continue PO amio 200 mg bid, decrease to 200 mg daily tomorrow - no longer on a/c (Eliquis discontinued) comfort meds only   Shock liver: LFTs elevated on arrival, secondary to congestion and shock.  - improved with treatment of shock and diuresis  AKI on CKD IIIb: Secondary to cardiorenal syndrome and shock. - b/l SCr 2.7 - SCr 3.2>3.6>3.41>3.49 today - non oliguric   Hyperkalemia:  - Resolved, K 3.9  COPD:  - Home inhalers  Denied for inpatient hospices. CM/SW looking into SNF options.   Length of Stay: 9  Brandon Taylon Louison, NP  04/26/2023, 8:01 AM  Advanced Heart Failure Team Pager [phone] (M-F; 7a - 5p)  Please contact [facility] for night-coverage after hours (5p -7a ) and weekends on [email or web address]

## 2023-04-26 NOTE — Plan of Care (Signed)
  Problem: Education: Goal: Knowledge of disease or condition will improve Outcome: Progressing Goal: Understanding of medication regimen will improve Outcome: Progressing Goal: Individualized Educational Video(s) Outcome: Progressing   Problem: Activity: Goal: Ability to tolerate increased activity will improve Outcome: Progressing   Problem: Cardiac: Goal: Ability to achieve and maintain adequate cardiopulmonary perfusion will improve Outcome: Progressing   Problem: Health Behavior/Discharge Planning: Goal: Ability to safely manage health-related needs after discharge will improve Outcome: Progressing   Problem: Education: Goal: Knowledge of General Education information will improve Description: Including pain rating scale, medication(s)/side effects and non-pharmacologic comfort measures Outcome: Progressing   Problem: Health Behavior/Discharge Planning: Goal: Ability to manage health-related needs will improve Outcome: Progressing   Problem: Clinical Measurements: Goal: Ability to maintain clinical measurements within normal limits will improve Outcome: Progressing Goal: Will remain free from infection Outcome: Progressing Goal: Diagnostic test results will improve Outcome: Progressing Goal: Respiratory complications will improve Outcome: Progressing Goal: Cardiovascular complication will be avoided Outcome: Progressing   Problem: Activity: Goal: Risk for activity intolerance will decrease Outcome: Progressing   Problem: Nutrition: Goal: Adequate nutrition will be maintained Outcome: Progressing   Problem: Nutrition: Goal: Adequate nutrition will be maintained Outcome: Progressing   Problem: Coping: Goal: Level of anxiety will decrease Outcome: Progressing   Problem: Elimination: Goal: Will not experience complications related to bowel motility Outcome: Progressing Goal: Will not experience complications related to urinary retention Outcome:  Progressing   Problem: Pain Managment: Goal: General experience of comfort will improve and/or be controlled Outcome: Progressing   Problem: Safety: Goal: Ability to remain free from injury will improve Outcome: Progressing   Problem: Education: Goal: Ability to demonstrate management of disease process will improve Outcome: Progressing Goal: Ability to verbalize understanding of medication therapies will improve Outcome: Progressing Goal: Individualized Educational Video(s) Outcome: Progressing   Problem: Activity: Goal: Capacity to carry out activities will improve Outcome: Progressing   Problem: Cardiac: Goal: Ability to achieve and maintain adequate cardiopulmonary perfusion will improve Outcome: Progressing   Problem: Coping: Goal: Ability to identify and develop effective coping behavior will improve Outcome: Progressing   Problem: Clinical Measurements: Goal: Quality of life will improve Outcome: Progressing   Problem: Respiratory: Goal: Verbalizations of increased ease of respirations will increase Outcome: Progressing   Problem: Role Relationship: Goal: Family's ability to cope with current situation will improve Outcome: Progressing Goal: Ability to verbalize concerns, feelings, and thoughts to partner or family member will improve Outcome: Progressing   Problem: Pain Management: Goal: Satisfaction with pain management regimen will improve Outcome: Progressing

## 2023-04-26 NOTE — TOC Progression Note (Signed)
Transition of Care [name]) - Progression Note    Patient Details  Name: Brandon [name] MRN: [id number] Date of Birth: February 10, 1941  Transition [facility] CM/SW Contact  Nicanor Bake Phone Number: [phone] 04/26/2023, 9:59 AM  Clinical Narrative:  HF CSW faxed pt out to SNFs. Offers are currently pending. FL2 completed.   10:01 AM- HF CSW called and spoke with pts wife to inform her that the pt has been faxed out and offers are pending. CSW explained the SNF process. Pts wife stated that she is also considering somewhere long term for the pt to go.   TOC will continue following.      Expected Discharge Plan: Home/Self Care Barriers to Discharge: Continued Medical Work up  Expected Discharge Plan and Services   Discharge Planning Services: CM Consult Post Acute Care Choice: Hospice Living arrangements for the past 2 months: Single Family Home                           HH Arranged: RN [facility] Agency: [facility] Date HH Agency Contacted: 04/25/23 Time HH Agency Contacted: 1101 Representative spoke with at [facility] Agency: Norm Parcel   Social Determinants of Health (SDOH) Interventions SDOH Screenings   Food Insecurity: No Food Insecurity (04/16/2023)  Housing: Low Risk  (04/17/2023)  Transportation Needs: No Transportation Needs (04/16/2023)  Utilities: Not At Risk (04/16/2023)  Alcohol Screen: Low Risk  (07/01/2022)  Depression (PHQ2-9): Low Risk  (02/03/2023)  Financial Resource Strain: Low Risk  (01/13/2023)  Physical Activity: Insufficiently Active (07/25/2020)  Social Connections: Socially Integrated (04/16/2023)  Stress: No Stress Concern Present (05/28/2021)  Tobacco Use: Medium Risk (04/19/2023)  Health Literacy: Adequate Health Literacy (01/27/2023)    Readmission Risk Interventions     No data to display

## 2023-04-26 NOTE — Progress Notes (Signed)
Daily Progress Note   Patient Name: Brandon [name]       Date: 04/26/2023 DOB: 10-Dec-1940  Age: 83 y.o. MRN#: [id number] Attending Physician: Brandon Nettles, DO Primary Care Physician: Brandon Craze, NP Admit Date: 04/15/2023 Length of Stay: 11 days  Reason for Consultation/Follow-up: Establishing goals of care  HPI/Patient Profile:  83 y.o. 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 He presented from primary cardiologist office on recommendation due to shortness of breath.  He was admitted on 04/15/2023 with persistent atrial fibrillation (now status post DCCV after TEE), nonischemic cardiomyopathy, end-stage heart failure, group 2 pulmonary hypertension, AKI on CKD, shock liver, and others.    Palliative medicine was consulted for GOC conversations.  Subjective:   Subjective: Chart Reviewed. Updates received. Patient Assessed. Created space and opportunity for patient  and family to explore thoughts and feelings regarding current medical situation.  Today's Discussion: Today saw the patient at bedside, no family was present.  The patient was sleeping and I elected to not wake him. I spent time discussing the patient's unique situation and possible d/c options in an effort to be able to manage his symptoms quickly when they do surface from his inevitable decline.  After seeing the patient I called and updated his daughter Brandon [name].  We spent significant time talking about his clinical status, some concerns she has.  Specifically the patient has not been sleeping well for the past couple nights and she is encouraging him to ask for Ativan.  He did receive a dose this morning which seems to have helped.  I shared that I would ask the RN to pass on the night shift to please offer him Ativan to see if he will excepted at bedtime to help with sleep.  We also discussed his discharge disposition and a  difficult place we are in where he is not sick enough for residential hospice but too sick to be cared for at home by his wife.  We are working on options and shared the palliative medicine will continue to follow along support and this process is agreeable.  I shared that palliative medicine will continue to see the patient at least daily while he is inpatient and comfort care.  I provided emotional and general support through therapeutic listening, empathy, sharing of stories, therapeutic touch, and other techniques. I answered all questions and addressed all concerns to the best of my ability.  Review of Systems  Unable to perform ROS (Patient sleeping and I elected to not wake him)  Objective:   Vital Signs:  BP 110/77 (BP Location: Left Arm)   Pulse 68   Temp 98.7 F (37.1 C) (Oral)   Resp 18   Ht 5\' 11"  (1.803 m)   Wt 78 kg   SpO2 96%   BMI 23.98 kg/m   Physical Exam Vitals and nursing note reviewed.  Constitutional:      General: He is sleeping. He is not in acute distress.    Appearance: He is ill-appearing.  HENT:     Head: Normocephalic and atraumatic.  Cardiovascular:     Rate and Rhythm: Regular rhythm. Tachycardia present.  Pulmonary:     Effort: Pulmonary effort is normal. No respiratory distress.  Abdominal:     General: Abdomen is flat. There is no distension.     Palliative Assessment/Data: 50%    Existing Vynca/ACP Documentation: None  Assessment &  Plan:   Impression: Present on Admission:  Persistent atrial fibrillation (HCC)  Atrial fibrillation with RVR (HCC)  83 year old male with acute presentation chronic comorbidities as described above. Patient and daughter Brandon [name] are very well aware of his current clinical situation and the predicament this process.  He appears stable and feels well off milrinone, although his labs are worse.  Patient and family understand he is approaching end-of-life and have agreed to referral for inpatient hospice.   They understand he is high risk for sudden decompensation and subsequently we have shifted to a more comfort focused approach. Weighing disposition options with a goal of comfort when eventual decline/end of life occurs, likely sooner than later. Overall long-term prognosis poor   SUMMARY OF RECOMMENDATIONS   Changed to DNR-comfort Anticipating hospice evaluation today Transition to more comfort focused care Palliative medicine will follow for comfort/symptom management as well as discussions on disposition level of care consistent with goals plus reality Continued emotional and spiritual support of patient and family  Symptom Management:  Dilaudid PRN pain/dyspnea/increased work of breathing/RR>25 Tylenol PRN pain/fever Biotin PRN dry mouth Robinul PRN secretions Haldol PRN agitation/delirium Ativan PRN anxiety/seizure/sleep/distress Zofran PRN nausea/vomiting Liquifilm Tears PRN dry eye Continue amiodarone, gabapentin, inhalers, Singulair, torsemide for comfort    Code Status: DNR-interventions desired  Prognosis: < 4 weeks  Discharge Planning: To Be Determined  Discussed with: Patient, patient's family, medical team, nursing team, hospice liaison  Thank you for allowing [place] to participate in the care of Brandon [name] PMT will continue to support holistically.  Time Total: 53 min  Detailed review of medical records (labs, imaging, vital signs), medically appropriate exam, discussed with treatment team, counseling and education to patient, family, & staff, documenting clinical information, medication management, coordination of care  Wynne Dust, NP Palliative Medicine Team  Team Phone # [phone] (Nights/Weekends)  11/11/2020, 8:17 AM

## 2023-04-26 NOTE — Evaluation (Signed)
Occupational Therapy Evaluation Patient Details Name: Brandon [name] MRN: [id number] DOB: 1940-09-01 Today's Date: 04/26/2023   History of Present Illness   History of Present Illness: Pt is 83 y.o. male admitted 04/15/23 with cardiogenic shock in the setting of recurrent atrial fibrillation. 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     Clinical Impressions This 83 yo male admitted with above presents to acute OT with PLOF of being totally independent with basic ADLs, sharing IADLs, and driving. Currently he is setup-min A for basic ADLs. He will continue to benefit from acute OT with follow up from continued inpatient follow up therapy, <3 hours/day      If plan is discharge home, recommend the following:   A little help with walking and/or transfers;A little help with bathing/dressing/bathroom;Assistance with cooking/housework;Assist for transportation;Help with stairs or ramp for entrance     Functional Status Assessment   Patient has had a recent decline in their functional status and demonstrates the ability to make significant improvements in function in a reasonable and predictable amount of time.     Equipment Recommendations   Other (comment) (TBD next venue)     Recommendations for Other Services    (none)     Precautions/Restrictions   Precautions Precautions: Fall Recall of Precautions/Restrictions: Impaired Restrictions Weight Bearing Restrictions Per Provider Order: No     Mobility Bed Mobility Overal bed mobility: Modified Independent Bed Mobility: Supine to Sit, Sit to Supine     Supine to sit: HOB elevated, Used rails Sit to supine: HOB elevated        Transfers Overall transfer level: Needs assistance Equipment used: None Transfers: Sit to/from Stand Sit to Stand: Contact guard assist                  Balance Overall balance assessment: Needs  assistance Sitting-balance support: No upper extremity supported, Feet supported Sitting balance-Leahy Scale: Fair Sitting balance - Comments: can cross legs to get to feet for ADLs without LOB   Standing balance support: Single extremity supported, During functional activity Standing balance-Leahy Scale: Poor Standing balance comment: standing for back peri care, with mild swaying at times with at one instance he had to reach out and steady himself                           ADL either performed or assessed with clinical judgement   ADL Overall ADL's : Needs assistance/impaired Eating/Feeding: Independent;Sitting   Grooming: Set up;Sitting   Upper Body Bathing: Set up;Sitting   Lower Body Bathing: Minimal assistance Lower Body Bathing Details (indicate cue type and reason): CGA sit<>stand Upper Body Dressing : Set up;Sitting   Lower Body Dressing: Minimal assistance Lower Body Dressing Details (indicate cue type and reason): CGA sit<>stand Toilet Transfer: Minimal assistance Toilet Transfer Details (indicate cue type and reason): simulated with side steps up towards HOB, no AD Toileting- Clothing Manipulation and Hygiene: Minimal assistance Toileting - Clothing Manipulation Details (indicate cue type and reason): CGA sit<>stand             Vision Patient Visual Report: No change from baseline              Pertinent Vitals/Pain Pain Assessment Pain Assessment: 0-10 Pain Score: 7  Pain Location: RUE upper arm (did not hurt at first, but did by the end of the session--pt did report it was  hurting earlier today too). Arm swollen compared to other arm and pt reports where bandage is PICC line was removed. Pain Descriptors / Indicators: Constant, Throbbing Pain Intervention(s): Limited activity within patient's tolerance, Monitored during session, Repositioned, Patient requesting pain meds-RN notified     Extremity/Trunk Assessment Upper Extremity  Assessment Upper Extremity Assessment: Right hand dominant;RUE deficits/detail RUE Deficits / Details: Full AROM of this arm, but edematous compared to LUE, pt reports PICC line removed from this arm           Communication Communication Communication: No apparent difficulties   Cognition Arousal: Alert Behavior During Therapy: WFL for tasks assessed/performed                                 Following commands: Intact       Cueing   Cueing Techniques: Verbal cues              Home Living Family/patient expects to be discharged to:: Skilled nursing facility Living Arrangements: Spouse/significant other Available Help at Discharge: Available PRN/intermittently;Family (wife and other family work) Type of Home: House Home Access: Stairs to enter Secretary/administrator of Steps: 3 Entrance Stairs-Rails: None Home Layout: Two level Alternate Level Stairs-Number of Steps: Flight Alternate Level Stairs-Rails: Can reach both Bathroom Shower/Tub: Walk-in shower;Door         Home Equipment: Cane - single point          Prior Functioning/Environment Prior Level of Function : Independent/Modified Independent             Mobility Comments: Pt reports he ambulated without an AD and was able to go/down stairs independently ADLs Comments: Independent with ADLs, drives, and manages his own medications.    OT Problem List: Impaired balance (sitting and/or standing);Pain;Increased edema   OT Treatment/Interventions: Self-care/ADL training;DME and/or AE instruction;Balance training;Patient/family education      OT Goals(Current goals can be found in the care plan section)   Acute Rehab OT Goals Patient Stated Goal: agreeable to get up and work with me at bedside--very pleasant OT Goal Formulation: With patient Time For Goal Achievement: 05/10/23 Potential to Achieve Goals: Fair   OT Frequency:  Min 1X/week       AM-PAC OT "6 Clicks" Daily  Activity     Outcome Measure Help from another person eating meals?: None Help from another person taking care of personal grooming?: A Little Help from another person toileting, which includes using toliet, bedpan, or urinal?: A Little Help from another person bathing (including washing, rinsing, drying)?: A Little Help from another person to put on and taking off regular upper body clothing?: A Little Help from another person to put on and taking off regular lower body clothing?: A Little 6 Click Score: 19   End of Session Equipment Utilized During Treatment: Rolling walker (2 wheels) Nurse Communication: Patient requests pain meds (for RUE Pain)  Activity Tolerance: Patient tolerated treatment well Patient left: in bed;with call bell/phone within reach;with bed alarm set  OT Visit Diagnosis: Unsteadiness on feet (R26.81);Other abnormalities of gait and mobility (R26.89);Pain Pain - Right/Left: Right Pain - part of body: Arm (upper arm)                Time: 5784-6962 OT Time Calculation (min): 17 min Charges:  OT General Charges $OT Visit: 1 Visit OT Evaluation $OT Eval Moderate Complexity: 1 Mod OT Treatments $Self Care/Home Management : 23-37 mins  Cathy L.  OT Acute Rehabilitation Services Office [phone]    Evette Georges 04/26/2023, 1:50 PM

## 2023-04-27 ENCOUNTER — Ambulatory Visit: Payer: Medicare Other | Admitting: Family

## 2023-04-27 DIAGNOSIS — Z515 Encounter for palliative care: Secondary | ICD-10-CM | POA: Diagnosis not present

## 2023-04-27 DIAGNOSIS — I4891 Unspecified atrial fibrillation: Secondary | ICD-10-CM | POA: Diagnosis not present

## 2023-04-27 DIAGNOSIS — Z7189 Other specified counseling: Secondary | ICD-10-CM | POA: Diagnosis not present

## 2023-04-27 DIAGNOSIS — Z66 Do not resuscitate: Secondary | ICD-10-CM | POA: Diagnosis not present

## 2023-04-27 LAB — BASIC METABOLIC PANEL
Anion gap: 13 (ref 5–15)
BUN: 85 mg/dL — ABNORMAL HIGH (ref 8–23)
CO2: 26 mmol/L (ref 22–32)
Calcium: 10 mg/dL (ref 8.9–10.3)
Chloride: 93 mmol/L — ABNORMAL LOW (ref 98–111)
Creatinine, Ser: 3.18 mg/dL — ABNORMAL HIGH (ref 0.61–1.24)
GFR, Estimated: 19 mL/min — ABNORMAL LOW (ref >60)
Glucose, Bld: 112 mg/dL — ABNORMAL HIGH (ref 70–99)
Potassium: 4.3 mmol/L (ref 3.5–5.1)
Sodium: 132 mmol/L — ABNORMAL LOW (ref 135–145)

## 2023-04-27 MED ORDER — AMIODARONE HCL 200 MG PO TABS
200.0000 mg | ORAL_TABLET | Freq: Every day | ORAL | 2 refills | Status: DC
Start: 2023-04-28 — End: 2023-09-14

## 2023-04-27 MED ORDER — TORSEMIDE 20 MG PO TABS: 40.0000 mg | ORAL_TABLET | Freq: Every day | ORAL | 2 refills | Status: DC

## 2023-04-27 NOTE — Progress Notes (Signed)
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     Subjective:    Did not meet criteria for inpatient hospice. Dispo planning per CM/SW. Still awaiting SNF.   SCr slightly better today, 3.49>>3.18.  K 4.3   Doing ok. Denies any discomfort. No resting dyspnea. Appetite is great. He ate all of his breakfast. He is very appreciative of the care he has received.    Objective:    Weight Range: 78 kg Body mass index is 23.98 kg/m.   Vital Signs:   Temp:  [98 F (36.7 C)-98.4 F (36.9 C)] 98 F (36.7 C) (02/12 0809) Pulse Rate:  [69-85] 85 (02/12 0809) Resp:  [18-19] 19 (02/12 0809) BP: (109-118)/(73-78) 109/73 (02/12 0809) SpO2:  [95 %-96 %] 95 % (02/12 0809) Last BM Date : 04/24/23  Weight change: Filed Weights   04/22/23 0528 04/23/23 0412 04/24/23 0408  Weight: 79.3 kg 78.2 kg 78 kg   Intake/Output:   Intake/Output Summary (Last 24 hours) at 04/27/2023 0834 Last data filed at 04/26/2023 2000 Gross per 24 hour  Intake 400 ml  Output 800 ml  Net -400 ml    Physical Exam    General:  elderly AAM. No distress  HEENT: normal Neck: supple. JVD 7-8 cm. Carotids 2+ bilat; no bruits. No lymphadenopathy or thyromegaly appreciated. Cor: PMI nondisplaced. Regular rate & rhythm. No rubs, gallops or murmurs. Lungs: clear Abdomen: soft, nontender, nondistended. No hepatosplenomegaly. No bruits or masses. Good bowel sounds. Extremities: no cyanosis, clubbing, rash, edema, dry  Neuro: alert & oriented x 3, cranial nerves grossly intact. moves all 4 extremities w/o difficulty. Affect pleasant.   Telemetry   N/A   Labs    CBC No results for input(s): "WBC", "NEUTROABS", "HGB", "HCT", "MCV", "PLT" in the  last 72 hours.  Basic Metabolic Panel Recent Labs    65/78/46 0251 04/27/23 0226  NA 130* 132*  K 3.9 4.3  CL 91* 93*  CO2 27 26  GLUCOSE 116* 112*  BUN 83* 85*  CREATININE 3.49* 3.18*  CALCIUM 9.5 10.0   Liver Function Tests No results for input(s): "AST", "ALT", "ALKPHOS", "BILITOT", "PROT", "ALBUMIN" in the last 72 hours.  No results for input(s): "LIPASE", "AMYLASE" in the last 72 hours. Cardiac Enzymes No results for input(s): "CKTOTAL", "CKMB", "CKMBINDEX", "TROPONINI" in the last 72 hours.  BNP: BNP (last 3 results) Recent Labs    04/15/23 1627  BNP 2,612.3*   ProBNP (last 3 results) No results for input(s): "PROBNP" in the last 8760 hours.  D-Dimer No results for input(s): "DDIMER" in the last 72 hours. Hemoglobin A1C No results for input(s): "HGBA1C" in the last 72 hours. Fasting Lipid Panel No results for input(s): "CHOL", "HDL", "LDLCALC", "TRIG", "CHOLHDL", "LDLDIRECT" in the last 72 hours.  Thyroid Function Tests No results for input(s): "TSH", "T4TOTAL", "T3FREE", "THYROIDAB" in the last 72 hours.  Invalid input(s): "FREET3"  Other results:  Medications:    Scheduled Medications:  amiodarone  200 mg Oral Daily   gabapentin  300 mg Oral QHS   mometasone-formoterol  2 puff Inhalation BID   montelukast  10 mg Oral QHS   potassium chloride  10 mEq Oral Daily   sodium chloride flush  10-40 mL Intracatheter Q12H   torsemide  40 mg Oral Daily    Infusions:    PRN Medications: acetaminophen **OR** acetaminophen, antiseptic oral rinse, feeding supplement, glycopyrrolate **OR** glycopyrrolate **OR** glycopyrrolate, haloperidol **OR** haloperidol **OR** haloperidol lactate, HYDROmorphone (DILAUDID) injection, LORazepam **OR** LORazepam **OR** LORazepam, ondansetron **OR** ondansetron (ZOFRAN) IV, mouth rinse, polyvinyl alcohol, sodium chloride flush  Assessment/Plan   Cardiogenic shock: Presented with elevated lactate, hepatic congestion,  borderline BP in the setting of recurrent atrial fibrillation. ACC/AHA Stage D end stage cardiomyopathy. Not an advanced therapies candidate given age. Placed on milrinone and lasix gtt for palliative diuresis. S/p successful DCCV 2/4 and maintaining NSR but failed milrinone wean. Co-ox post cardioversion 48%. Restarted on low dose milrinone at 0.125 w/ improvement in co-ox. Milrinone stopped again 2/7. Has now transitioned to comfort care. He appears comfortable and euvolemic. He denies resting dyspnea.  - continue torsemide 40 mg daily  - other GDMT limited by CKD and soft BP   Atrial fibrillation: Unclear if primary cause or secondary to his decompensation. S/p DCCV 2/4, took 2 attempts. RRR on exam. No longer on tele (comfort care)  - continue PO amio>>reduced to 200 mg once daily today  - no longer on a/c (Eliquis discontinued) comfort meds only   Shock liver: LFTs elevated on arrival, secondary to congestion and shock.  - improved with treatment of shock and diuresis  AKI on CKD IIIb: Secondary to cardiorenal syndrome and shock. - b/l SCr 2.7 - SCr 3.2>3.6>3.41>3.49>>3.18 today, suspect this is his new baseline  - non oliguric   Hyperkalemia:  - resolved, K 4.3   COPD:  - Home inhalers  Denied for inpatient hospices. CM/SW looking into SNF options. Will be ready for d/c once bed becomes available.   Length of Stay: [address] PA-C  04/27/2023, 8:34 AM  Advanced Heart Failure Team Pager [phone] (M-F; 7a - 5p)  Please contact [facility] for night-coverage after hours (5p -7a ) and weekends on [email or web address]

## 2023-04-27 NOTE — TOC Progression Note (Addendum)
Transition of Care [facility]) - Progression Note    Patient Details  Name: Brandon [name] MRN: [id number] Date of Birth: Dec 03, 1940  Transition of Care [facility]) CM/SW Contact  Nicanor Bake Phone Number: [phone] 04/27/2023, 11:33 AM  Clinical Narrative:  HF CSW met with pt at bedside. Pt called his daughter, [name] over the phone while CSW explained SNF accepted offers. CSW then explained the SNF process. Pt and daughter stated to be at a facility in [place] or [place]." Pt and daughter stated that [facility] would be the first choice based off of location. Pts daughter inquired about accepted [place] options.   10:59 AM- CSW called [facility] and left a VM for [employer] asking to be called back.   11:01 AM- CSW contacted [facility] who stated that they have STR and LTC beds available. LTC will be $325/day for a semi private room.   11:07 AM- CSW contacted Rhonda at [facility] who stated that they do not have any LTC beds available.   11:06 AM- CSW contacted Tammy who stated that thye do not have any LTC beds available at [facility] or [facility].   11:09 AM- CSW contacted Kia at [facility] who stated tha they did not have any LTC beds available.   11:14 AM- CSW called Olin Pia at [facility] to inquire about LTC beds. CSW left a VM.   11:15 AM- Lavonna Rua, from [facility] stated that they are offering a STR for 2 weeks for pt since that is what insurance is covering. LTC will have to be paid for OOP. $292/day- shared room and $325/day- private room.   11:22 AM- CSW called and updated the pts daughter and discussed OOP expense and insurance coverage for STR. CSW explained that if they choose this facility insurance Berkley Harvey will be started for [facility].   11:25 AM- CSW called pts wife  and discussed OOP expense and insurance coverage for STR. Pts wife stated once he leaves rehab she is unsure where pt will go or what the plan will be due to  caring for at him at home being challenging. Pts wife stated that they can not afford OOP cost. CSW explained that if they choose this facility insurance auth will be started. No decision has been made.   CSW sent the patients information to the Financial Counselors to be screened for Medicaid. Will follow up.   12:24 PM- CSW called and spoke with pts wife to share updates that per physicians pt is medically stable and eady for dc. CSW received permission to start the [place] insurance process for [facility]. Insurance auth pending.   TOC will continue following.      Expected Discharge Plan: Home/Self Care Barriers to Discharge: Continued Medical Work up  Expected Discharge Plan and Services   Discharge Planning Services: CM Consult Post Acute Care Choice: Hospice Living arrangements for the past 2 months: Single Family Home                           HH Arranged: RN [facility] Agency: [facility] Date HH Agency Contacted: 04/25/23 Time HH Agency Contacted: 1101 Representative spoke with at [facility] Agency: Norm Parcel   Social Determinants of Health (SDOH) Interventions SDOH Screenings   Food Insecurity: No Food Insecurity (04/16/2023)  Housing: Low Risk  (04/17/2023)  Transportation Needs: No Transportation Needs (04/16/2023)  Utilities: Not At Risk (04/16/2023)  Alcohol Screen: Low Risk  (07/01/2022)  Depression (PHQ2-9): Low Risk  (02/03/2023)  Financial Resource Strain: Low Risk  (01/13/2023)  Physical Activity: Insufficiently Active (07/25/2020)  Social Connections: Socially Integrated (04/16/2023)  Stress: No Stress Concern Present (05/28/2021)  Tobacco Use: Medium Risk (04/19/2023)  Health Literacy: Adequate Health Literacy (01/27/2023)    Readmission Risk Interventions     No data to display

## 2023-04-27 NOTE — Progress Notes (Signed)
Daily Progress Note   Patient Name: Brandon [name]       Date: 04/27/2023 DOB: 04-Aug-1940  Age: 83 y.o. MRN#: [id number] Attending Physician: Dorthula Nettles, DO Primary Care Physician: [name], NP Admit Date: 04/15/2023 Length of Stay: 12 days  Reason for Consultation/Follow-up: Establishing goals of care  HPI/Patient Profile:  83 y.o. 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 He presented from primary cardiologist office on recommendation due to shortness of breath.  He was admitted on 04/15/2023 with persistent atrial fibrillation (now status post DCCV after TEE), nonischemic cardiomyopathy, end-stage heart failure, group 2 pulmonary hypertension, AKI on CKD, shock liver, and others.    Palliative medicine was consulted for GOC conversations.  Subjective:   Subjective: Chart Reviewed. Updates received. Patient Assessed. Created space and opportunity for patient  and family to explore thoughts and feelings regarding current medical situation.  Today's Discussion: Today saw the patient at bedside, no family was present.  The patient was laying in the bed but awake.  When I entered he greeted me warmly and made/kept eye contact.  We discussed how he is doing today and he denies pain, nausea, vomiting.  States he ate a good breakfast.  He does note that his right arm is a bit swollen and he is trying to keep moving it to help.  I explained that this is likely due to fluid buildup because of his health and offered pain medication, which he declined.  I encouraged him to ask for pain medication if he gets more comfortable which he agreed to.  He also notes that he was a bit nauseous yesterday but got nausea medication and this helped.  He is not nauseous currently.  I shared that we are currently investigating where we can discharge him to and once we know that we can talk about further care options  in the future.  I shared that palliative medicine will continue to see the patient at least daily while he is inpatient and comfort care.  I provided emotional and general support through therapeutic listening, empathy, sharing of stories, therapeutic touch, and other techniques. I answered all questions and addressed all concerns to the best of my ability.  Review of Systems  Constitutional:        Denies pain in general  Respiratory:  Negative for chest tightness and shortness of breath.   Cardiovascular:  Negative for chest pain.  Gastrointestinal:  Negative for abdominal pain, nausea and vomiting.  Musculoskeletal:        Right arm swelling, some discomfort but declines pain medication    Objective:   Vital Signs:  BP 109/73   Pulse 85   Temp 98 F (36.7 C) (Oral)   Resp 19   Ht 5\' 11"  (1.803 m)   Wt 78 kg   SpO2 95%   BMI 23.98 kg/m   Physical Exam Vitals and nursing note reviewed.  Constitutional:      General: He is sleeping. He is not in acute distress.    Appearance: He is ill-appearing.  HENT:     Head: Normocephalic and atraumatic.  Cardiovascular:     Rate and Rhythm: Regular rhythm. Tachycardia present.  Pulmonary:     Effort: Pulmonary effort is normal. No respiratory distress.  Abdominal:     General: Abdomen is flat. Bowel sounds are normal. There is no distension.     Palpations:  Abdomen is soft.  Skin:    General: Skin is warm and dry.  Neurological:     General: No focal deficit present.     Mental Status: He is oriented to person, place, and time.  Psychiatric:        Mood and Affect: Mood normal.        Behavior: Behavior normal.     Palliative Assessment/Data: 50%    Existing Vynca/ACP Documentation: None  Assessment & Plan:   Impression: Present on Admission:  Persistent atrial fibrillation (HCC)  Atrial fibrillation with RVR (HCC)  83 year old male with acute presentation chronic comorbidities as described above. Patient and  daughter [name] are very well aware of his current clinical situation and the predicament this process.  He appears stable and feels well off milrinone, although his labs are worse.  Patient and family understand he is approaching end-of-life and have agreed to referral for inpatient hospice.  They understand he is high risk for sudden decompensation and subsequently we have shifted to a more comfort focused approach. Weighing disposition options with a goal of comfort when eventual decline/end of life occurs, likely sooner than later. Overall long-term prognosis poor   SUMMARY OF RECOMMENDATIONS   Changed to DNR-comfort Patient remains not a candidate for inpatient hospice Continue comfort care  Palliative medicine will follow for comfort/symptom management as well as discussions on disposition level of care consistent with goals plus reality Will discuss/recommend outpatient palliative care with patient/family tomorrow Continued emotional and spiritual support of patient and family  Symptom Management:  Dilaudid PRN pain/dyspnea/increased work of breathing/RR>25 Tylenol PRN pain/fever Biotin PRN dry mouth Robinul PRN secretions Haldol PRN agitation/delirium Ativan PRN anxiety/seizure/sleep/distress Zofran PRN nausea/vomiting Liquifilm Tears PRN dry eye Continue amiodarone, gabapentin, inhalers, Singulair, torsemide for comfort    Code Status: DNR-interventions desired  Prognosis: < 4 weeks  Discharge Planning: To Be Determined  Discussed with: Patient, patient's family, medical team, nursing team, hospice liaison  Thank you for allowing [place] to participate in the care of Brandon [name] PMT will continue to support holistically.  Time Total: 48 min  Detailed review of medical records (labs, imaging, vital signs), medically appropriate exam, discussed with treatment team, counseling and education to patient, family, & staff, documenting clinical information, medication management,  coordination of care  Wynne Dust, NP Palliative Medicine Team  Team Phone # [phone] (Nights/Weekends)  11/11/2020, 8:17 AM

## 2023-04-27 NOTE — Plan of Care (Signed)
  Problem: Education: Goal: Knowledge of disease or condition will improve Outcome: Not Progressing Goal: Understanding of medication regimen will improve Outcome: Not Progressing Goal: Individualized Educational Video(s) Outcome: Not Progressing   Problem: Activity: Goal: Ability to tolerate increased activity will improve Outcome: Not Progressing   Problem: Cardiac: Goal: Ability to achieve and maintain adequate cardiopulmonary perfusion will improve Outcome: Not Progressing   Problem: Health Behavior/Discharge Planning: Goal: Ability to safely manage health-related needs after discharge will improve Outcome: Not Progressing   Problem: Education: Goal: Knowledge of General Education information will improve Description: Including pain rating scale, medication(s)/side effects and non-pharmacologic comfort measures Outcome: Not Progressing   Problem: Health Behavior/Discharge Planning: Goal: Ability to manage health-related needs will improve Outcome: Not Progressing   Problem: Clinical Measurements: Goal: Ability to maintain clinical measurements within normal limits will improve Outcome: Not Progressing Goal: Will remain free from infection Outcome: Not Progressing Goal: Diagnostic test results will improve Outcome: Not Progressing Goal: Respiratory complications will improve Outcome: Not Progressing Goal: Cardiovascular complication will be avoided Outcome: Not Progressing   Problem: Coping: Goal: Level of anxiety will decrease Outcome: Not Progressing   Problem: Skin Integrity: Goal: Risk for impaired skin integrity will decrease Outcome: Not Progressing   Problem: Education: Goal: Ability to demonstrate management of disease process will improve Outcome: Not Progressing Goal: Ability to verbalize understanding of medication therapies will improve Outcome: Not Progressing Goal: Individualized Educational Video(s) Outcome: Not Progressing   Problem:  Coping: Goal: Ability to identify and develop effective coping behavior will improve Outcome: Not Progressing   Problem: Clinical Measurements: Goal: Quality of life will improve Outcome: Not Progressing   Problem: Respiratory: Goal: Verbalizations of increased ease of respirations will increase Outcome: Not Progressing   Problem: Pain Management: Goal: Satisfaction with pain management regimen will improve Outcome: Not Progressing

## 2023-04-27 NOTE — Consult Note (Signed)
[facility] Liaison Consult Note   04/27/2023  Aryon E [place] February 18, 1941 [id number]  [facility] Patient: Active with [facility] RN CCM prior to admission.  [facility] Liaison rounding on unit, patient resting NAD, no family at bedside at current round.   Primary Care Provider:  [name], NP, [place] at [facility], this provider is listed to provide the community transition of care follow up and TOC calls  Insurance: [employer]  Patient is currently active with [facility] for care coordination services.  Patient has been engaged by a [employer].  The community based plan of care has focused on disease management and community resource support.   Patient was reviewed for ongoing needs and currently under comfort measures did not disturb patient. Per n  Plan: Continue to follow for any additional community care coordination needs for post hospital/community needs. Will update Community [facility] RN CC of concerns/needs when decisions are more finalized.  Of note, [facility] services does not replace or interfere with any services that are needed or arranged by inpatient [facility] care management team.   Charlesetta Shanks, RN, BSN, CCM [place]  [facility], [facility] Liaison Direct Dial: [phone] or secure chat Email: [email or web address]

## 2023-04-28 DIAGNOSIS — E785 Hyperlipidemia, unspecified: Secondary | ICD-10-CM | POA: Diagnosis present

## 2023-04-28 DIAGNOSIS — Z8673 Personal history of transient ischemic attack (TIA), and cerebral infarction without residual deficits: Secondary | ICD-10-CM | POA: Diagnosis not present

## 2023-04-28 DIAGNOSIS — Z7951 Long term (current) use of inhaled steroids: Secondary | ICD-10-CM | POA: Diagnosis not present

## 2023-04-28 DIAGNOSIS — I428 Other cardiomyopathies: Secondary | ICD-10-CM | POA: Diagnosis present

## 2023-04-28 DIAGNOSIS — M069 Rheumatoid arthritis, unspecified: Secondary | ICD-10-CM | POA: Diagnosis not present

## 2023-04-28 DIAGNOSIS — I429 Cardiomyopathy, unspecified: Secondary | ICD-10-CM | POA: Diagnosis not present

## 2023-04-28 DIAGNOSIS — I5022 Chronic systolic (congestive) heart failure: Secondary | ICD-10-CM | POA: Diagnosis present

## 2023-04-28 DIAGNOSIS — J449 Chronic obstructive pulmonary disease, unspecified: Secondary | ICD-10-CM | POA: Diagnosis present

## 2023-04-28 DIAGNOSIS — I714 Abdominal aortic aneurysm, without rupture, unspecified: Secondary | ICD-10-CM | POA: Diagnosis not present

## 2023-04-28 DIAGNOSIS — I4811 Longstanding persistent atrial fibrillation: Secondary | ICD-10-CM | POA: Diagnosis not present

## 2023-04-28 DIAGNOSIS — I130 Hypertensive heart and chronic kidney disease with heart failure and stage 1 through stage 4 chronic kidney disease, or unspecified chronic kidney disease: Secondary | ICD-10-CM | POA: Diagnosis present

## 2023-04-28 DIAGNOSIS — J441 Chronic obstructive pulmonary disease with (acute) exacerbation: Secondary | ICD-10-CM | POA: Diagnosis not present

## 2023-04-28 DIAGNOSIS — I4891 Unspecified atrial fibrillation: Secondary | ICD-10-CM | POA: Diagnosis not present

## 2023-04-28 DIAGNOSIS — I5020 Unspecified systolic (congestive) heart failure: Secondary | ICD-10-CM | POA: Diagnosis not present

## 2023-04-28 DIAGNOSIS — I10 Essential (primary) hypertension: Secondary | ICD-10-CM | POA: Diagnosis not present

## 2023-04-28 DIAGNOSIS — E1122 Type 2 diabetes mellitus with diabetic chronic kidney disease: Secondary | ICD-10-CM | POA: Diagnosis present

## 2023-04-28 DIAGNOSIS — E1165 Type 2 diabetes mellitus with hyperglycemia: Secondary | ICD-10-CM | POA: Diagnosis present

## 2023-04-28 DIAGNOSIS — D649 Anemia, unspecified: Secondary | ICD-10-CM | POA: Diagnosis not present

## 2023-04-28 DIAGNOSIS — I5084 End stage heart failure: Secondary | ICD-10-CM | POA: Diagnosis present

## 2023-04-28 DIAGNOSIS — R531 Weakness: Secondary | ICD-10-CM | POA: Diagnosis not present

## 2023-04-28 DIAGNOSIS — Z833 Family history of diabetes mellitus: Secondary | ICD-10-CM | POA: Diagnosis not present

## 2023-04-28 DIAGNOSIS — R601 Generalized edema: Secondary | ICD-10-CM | POA: Diagnosis not present

## 2023-04-28 DIAGNOSIS — Z66 Do not resuscitate: Secondary | ICD-10-CM | POA: Diagnosis present

## 2023-04-28 DIAGNOSIS — G629 Polyneuropathy, unspecified: Secondary | ICD-10-CM | POA: Diagnosis not present

## 2023-04-28 DIAGNOSIS — I2510 Atherosclerotic heart disease of native coronary artery without angina pectoris: Secondary | ICD-10-CM | POA: Diagnosis present

## 2023-04-28 DIAGNOSIS — I82B11 Acute embolism and thrombosis of right subclavian vein: Secondary | ICD-10-CM | POA: Diagnosis present

## 2023-04-28 DIAGNOSIS — I2723 Pulmonary hypertension due to lung diseases and hypoxia: Secondary | ICD-10-CM | POA: Diagnosis not present

## 2023-04-28 DIAGNOSIS — Z79899 Other long term (current) drug therapy: Secondary | ICD-10-CM | POA: Diagnosis not present

## 2023-04-28 DIAGNOSIS — Z743 Need for continuous supervision: Secondary | ICD-10-CM | POA: Diagnosis not present

## 2023-04-28 DIAGNOSIS — R570 Cardiogenic shock: Secondary | ICD-10-CM | POA: Diagnosis not present

## 2023-04-28 DIAGNOSIS — R2231 Localized swelling, mass and lump, right upper limb: Secondary | ICD-10-CM | POA: Diagnosis not present

## 2023-04-28 DIAGNOSIS — N184 Chronic kidney disease, stage 4 (severe): Secondary | ICD-10-CM | POA: Diagnosis present

## 2023-04-28 DIAGNOSIS — Z7189 Other specified counseling: Secondary | ICD-10-CM | POA: Diagnosis not present

## 2023-04-28 DIAGNOSIS — G4733 Obstructive sleep apnea (adult) (pediatric): Secondary | ICD-10-CM | POA: Diagnosis not present

## 2023-04-28 DIAGNOSIS — G9009 Other idiopathic peripheral autonomic neuropathy: Secondary | ICD-10-CM | POA: Diagnosis not present

## 2023-04-28 DIAGNOSIS — E1140 Type 2 diabetes mellitus with diabetic neuropathy, unspecified: Secondary | ICD-10-CM | POA: Diagnosis present

## 2023-04-28 DIAGNOSIS — Z87891 Personal history of nicotine dependence: Secondary | ICD-10-CM | POA: Diagnosis not present

## 2023-04-28 DIAGNOSIS — I272 Pulmonary hypertension, unspecified: Secondary | ICD-10-CM | POA: Diagnosis not present

## 2023-04-28 DIAGNOSIS — Z515 Encounter for palliative care: Secondary | ICD-10-CM | POA: Diagnosis not present

## 2023-04-28 DIAGNOSIS — R222 Localized swelling, mass and lump, trunk: Secondary | ICD-10-CM | POA: Diagnosis not present

## 2023-04-28 DIAGNOSIS — I2721 Secondary pulmonary arterial hypertension: Secondary | ICD-10-CM | POA: Diagnosis present

## 2023-04-28 DIAGNOSIS — I5032 Chronic diastolic (congestive) heart failure: Secondary | ICD-10-CM | POA: Diagnosis not present

## 2023-04-28 DIAGNOSIS — I499 Cardiac arrhythmia, unspecified: Secondary | ICD-10-CM | POA: Diagnosis not present

## 2023-04-28 DIAGNOSIS — I480 Paroxysmal atrial fibrillation: Secondary | ICD-10-CM | POA: Diagnosis present

## 2023-04-28 DIAGNOSIS — R5381 Other malaise: Secondary | ICD-10-CM | POA: Diagnosis not present

## 2023-04-28 DIAGNOSIS — I82621 Acute embolism and thrombosis of deep veins of right upper extremity: Secondary | ICD-10-CM | POA: Diagnosis not present

## 2023-04-28 DIAGNOSIS — Z7401 Bed confinement status: Secondary | ICD-10-CM | POA: Diagnosis not present

## 2023-04-28 DIAGNOSIS — Z7901 Long term (current) use of anticoagulants: Secondary | ICD-10-CM | POA: Diagnosis not present

## 2023-04-28 DIAGNOSIS — Z7984 Long term (current) use of oral hypoglycemic drugs: Secondary | ICD-10-CM | POA: Diagnosis not present

## 2023-04-28 DIAGNOSIS — Z8709 Personal history of other diseases of the respiratory system: Secondary | ICD-10-CM | POA: Diagnosis not present

## 2023-04-28 LAB — BASIC METABOLIC PANEL
Anion gap: 12 (ref 5–15)
BUN: 88 mg/dL — ABNORMAL HIGH (ref 8–23)
CO2: 24 mmol/L (ref 22–32)
Calcium: 9.5 mg/dL (ref 8.9–10.3)
Chloride: 93 mmol/L — ABNORMAL LOW (ref 98–111)
Creatinine, Ser: 3.32 mg/dL — ABNORMAL HIGH (ref 0.61–1.24)
GFR, Estimated: 18 mL/min — ABNORMAL LOW (ref >60)
Glucose, Bld: 106 mg/dL — ABNORMAL HIGH (ref 70–99)
Potassium: 4.6 mmol/L (ref 3.5–5.1)
Sodium: 129 mmol/L — ABNORMAL LOW (ref 135–145)

## 2023-04-28 NOTE — Care Management Important Message (Signed)
Important Message  Patient Details  Name: Brandon [name] MRN: [id number] Date of Birth: 12-13-1940   Important Message Given:  Yes - Medicare IM     Renie Ora 04/28/2023, 11:11 AM

## 2023-04-28 NOTE — Progress Notes (Signed)
Physical Therapy Treatment Patient Details Name: Brandon [name] MRN: [id number] DOB: 12/11/1940 Today's Date: 04/28/2023   History of Present Illness Pt is 83 y.o. male admitted 04/15/23 with cardiogenic shock in the setting of recurrent atrial fibrillation. 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    PT Comments  Pt pleasant and agreeable to PT session. Increased ambulatory distance using RW and CGA. Pt drifted R/L in the hallway and was unsteady when turning leaning R requiring minA to stabilize. Introduced Location manager without UE support. Pt maintained static stance for 1 minute before requiring support from RW. He engaged in semi-tandem for 30 seconds with intermittent support from RW. Pt is making steady progress towards his PT goals, but is limited by fatigue. Will continue to follow acutely and advance appropriately. Recommend post-acute rehab <3 hours/day of therapy.      If plan is discharge home, recommend the following: A little help with walking and/or transfers;A little help with bathing/dressing/bathroom;Assistance with cooking/housework;Assist for transportation;Help with stairs or ramp for entrance   Can travel by private vehicle     Yes  Equipment Recommendations  Rollator (4 wheels)    Recommendations for Other Services       Precautions / Restrictions Precautions Precautions: Fall Restrictions Weight Bearing Restrictions Per Provider Order: No     Mobility  Bed Mobility Overal bed mobility: Modified Independent Bed Mobility: Supine to Sit, Sit to Supine     Supine to sit: HOB elevated Sit to supine: HOB elevated   General bed mobility comments: Pt took increased time to get out and into bed.    Transfers Overall transfer level: Needs assistance Equipment used: Rolling walker (2 wheels) Transfers: Sit to/from Stand Sit to Stand: Contact guard assist           General transfer  comment: STS from lowest level bed height x2. Pt takes increased time to power up and reach erect posture.    Ambulation/Gait Ambulation/Gait assistance: Contact guard assist, Min assist Gait Distance (Feet): 100 Feet (x2, standing rest break) Assistive device: Rolling walker (2 wheels) Gait Pattern/deviations: Step-through pattern, Decreased stride length, Drifts right/left, Trunk flexed, Narrow base of support Gait velocity: Decreased Gait velocity interpretation: <1.31 ft/sec, indicative of household ambulator   General Gait Details: Pt ambulated with a slow gait, taking short steps with adequate foot clearence. Pt with NBOS, brushing legs as he advances. Cued to bring feet shoulder with apart and pt was able to adjust with intermittent VC. Pt drifted R/L during gait in hallway and leaned R when turning, required minA to stabilize.   Stairs             Wheelchair Mobility     Tilt Bed    Modified Rankin (Stroke Patients Only)       Balance Overall balance assessment: Mild deficits observed, not formally tested                                          Communication Communication Communication: No apparent difficulties  Cognition Arousal: Alert Behavior During Therapy: WFL for tasks assessed/performed   PT - Cognitive impairments: No apparent impairments                         Following commands: Intact  Cueing Cueing Techniques: Verbal cues, Tactile cues  Exercises Other Exercises Other Exercises: Standing Static Balance without UE Support: feet shoulder width apart x1 min, staggered stance 2x30sec alternating foot in front. (Pt with intermittent sway R>L and required intermittent minA to stabilize. No overt LOB.) Other Exercises: Standing    General Comments General comments (skin integrity, edema, etc.): VSS on RA      Pertinent Vitals/Pain Pain Assessment Pain Assessment: No/denies pain    Home Living                           Prior Function            PT Goals (current goals can now be found in the care plan section) Acute Rehab PT Goals Patient Stated Goal: Get stronger! Progress towards PT goals: Progressing toward goals    Frequency    Min 1X/week      PT Plan      Co-evaluation              AM-PAC PT "6 Clicks" Mobility   Outcome Measure  Help needed turning from your back to your side while in a flat bed without using bedrails?: A Little Help needed moving from lying on your back to sitting on the side of a flat bed without using bedrails?: A Little Help needed moving to and from a bed to a chair (including a wheelchair)?: A Little Help needed standing up from a chair using your arms (e.g., wheelchair or bedside chair)?: A Little Help needed to walk in hospital room?: A Little Help needed climbing 3-5 steps with a railing? : A Little 6 Click Score: 18    End of Session Equipment Utilized During Treatment: Gait belt Activity Tolerance: Patient limited by fatigue Patient left: in bed;with call bell/phone within reach;with bed alarm set Nurse Communication: Mobility status PT Visit Diagnosis: Unsteadiness on feet (R26.81);Other abnormalities of gait and mobility (R26.89);Muscle weakness (generalized) (M62.81)     Time: 1011-1040 PT Time Calculation (min) (ACUTE ONLY): 29 min  Charges:    $Gait Training: 8-22 mins $Neuromuscular Re-education: 8-22 mins PT General Charges $$ ACUTE PT VISIT: 1 Visit                     Cheri [name], PT, DPT Acute Rehabilitation Services Office: [phone] Secure Chat Preferred   Brandon [name] 04/28/2023, 10:52 AM

## 2023-04-28 NOTE — TOC Progression Note (Addendum)
Transition of Care [facility]) - Progression Note    Patient Details  Name: Brandon [name] MRN: [id number] Date of Birth: 05-21-1940  [facility]) CM/SW Contact  Reva Bores, LCSWA Phone Number: 04/28/2023, 10:01 AM  Clinical Narrative:  9:53 AM- HF CSW called and spoke with the pts  daughter to inform her that the pts insurance auth was approved. CSW explained that PTAR will provide transportation to SNF. Pt daughter agrees.   9:57 AM- HF CSW called and spoke with Justin Mend at [facility] to update her that pts insurance was approved. Lavonna Rua stated that the family needs to sign him and he will be good to go. Lavonna Rua will contact the pts daughter, [name] to have forms signed electronically.   10:11 AM: HF CSW called and spoke with wife over the phone to share the pts insurance approval updates. CSW explained that Sheree needs either her or [name] to complete documents electronically to sign pt in. Pts wife agrees for PTAR to transport. Pts wife needed assistance contacting facility. CSW assisted and connected wife on the phone with [facility] via three way.   Pts insurance was approved authorization ID:  [id number]     Expected Discharge Plan: Home/Self Care Barriers to Discharge: Continued Medical Work up  Expected Discharge Plan and Services   Discharge Planning Services: CM Consult Post Acute Care Choice: Hospice Living arrangements for the past 2 months: Single Family Home Expected Discharge Date: 04/27/23                         HH Arranged: RN [facility] Agency: [facility] [place] Date HH Agency Contacted: 04/25/23 Time HH Agency Contacted: 1101 Representative spoke with at [facility] Agency: Norm Parcel   Social Determinants of Health (SDOH) Interventions SDOH Screenings   Food Insecurity: No Food Insecurity (04/16/2023)  Housing: Low Risk  (04/17/2023)  Transportation Needs: No Transportation Needs (04/16/2023)  Utilities: Not At Risk (04/16/2023)  Alcohol Screen:  Low Risk  (07/01/2022)  Depression (PHQ2-9): Low Risk  (02/03/2023)  Financial Resource Strain: Low Risk  (01/13/2023)  Physical Activity: Insufficiently Active (07/25/2020)  Social Connections: Socially Integrated (04/16/2023)  Stress: No Stress Concern Present (05/28/2021)  Tobacco Use: Medium Risk (04/19/2023)  Health Literacy: Adequate Health Literacy (01/27/2023)    Readmission Risk Interventions     No data to display

## 2023-04-28 NOTE — Progress Notes (Signed)
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     Subjective:    Resting comfortably. No complaints. Waiting on insurance approval for SNF.   Scr stable 3.3 K 4.6   Objective:    Weight Range: 78 kg Body mass index is 23.98 kg/m.   Vital Signs:   Temp:  [98 F (36.7 C)-98.9 F (37.2 C)] 98.9 F (37.2 C) (02/13 0732) Pulse Rate:  [64-69] 69 (02/13 0732) Resp:  [18-19] 18 (02/13 0732) BP: (95-124)/(62-77) 124/77 (02/13 0732) SpO2:  [97 %-100 %] 98 % (02/13 0753) Last BM Date : 04/24/23  Weight change: Filed Weights   04/22/23 0528 04/23/23 0412 04/24/23 0408  Weight: 79.3 kg 78.2 kg 78 kg   Intake/Output:   Intake/Output Summary (Last 24 hours) at 04/28/2023 0950 Last data filed at 04/28/2023 0100 Gross per 24 hour  Intake 320 ml  Output 1400 ml  Net -1080 ml    Physical Exam    General:  elderly male, no distress  HEENT: normal Neck: supple. no JVD. Carotids 2+ bilat; no bruits. No lymphadenopathy or thyromegaly appreciated. Cor: PMI nondisplaced. Regular rate & rhythm. No rubs, gallops or murmurs. Lungs: clear Abdomen: soft, nontender, nondistended. Good bowel sounds. Extremities: no cyanosis, clubbing, rash, edema Neuro: alert & oriented x 3, cranial nerves grossly intact. moves all 4 extremities w/o difficulty. Affect pleasant.   Telemetry   N/A (comfort care)   Labs    CBC No results for input(s): "WBC", "NEUTROABS", "HGB", "HCT", "MCV", "PLT" in the last 72 hours.  Basic Metabolic Panel Recent Labs    16/10/96 0226 04/28/23 0252  NA 132* 129*  K 4.3 4.6  CL 93* 93*  CO2 26 24  GLUCOSE 112* 106*  BUN 85* 88*  CREATININE 3.18* 3.32*  CALCIUM 10.0 9.5    Liver Function Tests No results for input(s): "AST", "ALT", "ALKPHOS", "BILITOT", "PROT", "ALBUMIN" in the last 72 hours.  No results for input(s): "LIPASE", "AMYLASE" in the last 72 hours. Cardiac Enzymes No results for input(s): "CKTOTAL", "CKMB", "CKMBINDEX", "TROPONINI" in the last 72 hours.  BNP: BNP (last 3 results) Recent Labs    04/15/23 1627  BNP 2,612.3*   ProBNP (last 3 results) No results for input(s): "PROBNP" in the last 8760 hours.  D-Dimer No results for input(s): "DDIMER" in the last 72 hours. Hemoglobin A1C No results for input(s): "HGBA1C" in the last 72 hours. Fasting Lipid Panel No results for input(s): "CHOL", "HDL", "LDLCALC", "TRIG", "CHOLHDL", "LDLDIRECT" in the last 72 hours.  Thyroid Function Tests No results for input(s): "TSH", "T4TOTAL", "T3FREE", "THYROIDAB" in the last 72 hours.  Invalid input(s): "FREET3"  Other results:  Medications:    Scheduled Medications:  amiodarone  200 mg Oral Daily   gabapentin  300 mg Oral QHS   mometasone-formoterol  2 puff Inhalation BID   montelukast  10 mg Oral QHS   potassium chloride  10 mEq Oral Daily   sodium chloride flush  10-40 mL Intracatheter Q12H   torsemide  40 mg Oral Daily    Infusions:    PRN Medications: acetaminophen **OR** acetaminophen, antiseptic oral rinse, feeding supplement, glycopyrrolate **OR** glycopyrrolate **OR** glycopyrrolate, haloperidol **OR** haloperidol **OR** haloperidol  lactate, HYDROmorphone (DILAUDID) injection, LORazepam **OR** LORazepam **OR** LORazepam, ondansetron **OR** ondansetron (ZOFRAN) IV, mouth rinse, polyvinyl alcohol, sodium chloride flush  Assessment/Plan   Cardiogenic shock: Presented with elevated lactate, hepatic congestion, borderline BP in the setting of recurrent atrial fibrillation. ACC/AHA Stage D end stage cardiomyopathy. Not an advanced therapies candidate given age. Placed on milrinone and lasix gtt for palliative diuresis. S/p  successful DCCV 2/4 and maintaining NSR but failed milrinone wean. Co-ox post cardioversion 48%. Restarted on low dose milrinone at 0.125 w/ improvement in co-ox. Milrinone stopped again 2/7. Has now transitioned to comfort care. Remains comfortable. No respiratory difficulty. Euvolemic on exam.  - continue torsemide 40 mg daily  - other GDMT limited by CKD and soft BP   Atrial fibrillation: Unclear if primary cause or secondary to his decompensation. S/p DCCV 2/4, took 2 attempts. RRR on exam. No longer on tele (comfort care)  - continue PO amio 200 mg daily  - no longer on a/c (Eliquis discontinued) comfort meds only   Shock liver: LFTs elevated on arrival, secondary to congestion and shock.  - improved with treatment of shock and diuresis  AKI on CKD IIIb: Secondary to cardiorenal syndrome and shock. - previous b/l SCr 2.7 - SCr 3.2>3.6>3.41>3.49>>3.18>>3.3 today, suspect this is his new baseline  - non oliguric   Hyperkalemia:  - resolved, K 4.6 today    COPD:  - Home inhalers for comfort   Denied for inpatient hospices. CM/SW looking into SNF options. Waiting on insurance approval. Will be ready for d/c once approved and bed becomes available.   Length of Stay: [address] PA-C  04/28/2023, 9:50 AM  Advanced Heart Failure Team Pager [phone] (M-F; 7a - 5p)  Please contact [facility] for night-coverage after hours (5p -7a ) and weekends on [email or web address]

## 2023-04-28 NOTE — Progress Notes (Signed)
Report called to receiving nurse at [facility]. Report given to Bogue Chitto, RN, all questions answered. Patient set to DC to room 211, via PTAR.

## 2023-04-28 NOTE — Progress Notes (Signed)
Daily Progress Note   Patient Name: Brandon [name]       Date: 04/28/2023 DOB: 1941-03-03  Age: 83 y.o. MRN#: [id number] Attending Physician: Dorthula Nettles, DO Primary Care Physician: [name], NP Admit Date: 04/15/2023 Length of Stay: 13 days  Reason for Consultation/Follow-up: Establishing goals of care  HPI/Patient Profile:  83 y.o. 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 He presented from primary cardiologist office on recommendation due to shortness of breath.  He was admitted on 04/15/2023 with persistent atrial fibrillation (now status post DCCV after TEE), nonischemic cardiomyopathy, end-stage heart failure, group 2 pulmonary hypertension, AKI on CKD, shock liver, and others.    Palliative medicine was consulted for GOC conversations.  Subjective:   Subjective: Chart Reviewed. Updates received. Patient Assessed. Created space and opportunity for patient  and family to explore thoughts and feelings regarding current medical situation.  Today's Discussion: Today saw the patient at bedside, no family was present.  The patient was laying in the bed but awake.  When I entered he greeted me warmly and made/kept eye contact.  We discussed how he is doing today and he denies pain, nausea, vomiting.  He again had a good breakfast.  We discussed that he was able to get up and walk around in the hallway, and joked that he was only able to walk and not run.  He does note that his right arm remains a bit swollen and he is trying to keep moving it to help.  We talked about the plan for discharge today to SNF/rehab.  I shared my recommendation for outpatient palliative care so that he can monitor closely and if his health declines they can help transition to more hospice/comfort focused care when appropriate/desired, as we previously talked about.  He is in agreement with this.  We spent some time  reminiscing about her time together, and appreciating each other's company over the past several days.   we expressed gratitude for getting to know each other.  I wish him the best in the coming days, weeks, months.  After seeing the patient I reached out to the medical team, nursing team, [facility] team, and hospice liaison that was previously evaluating him from [facility] [place].  I shared my plan for [facility] consult for referral to outpatient palliative care as previously discussed.  I shared that palliative medicine will continue to see the patient at least daily while he is inpatient and comfort care.  I provided emotional and general support through therapeutic listening, empathy, sharing of stories, therapeutic touch, and other techniques. I answered all questions and addressed all concerns to the best of my ability.  Review of Systems  Constitutional:        Denies pain in general  Respiratory:  Negative for chest tightness and shortness of breath.   Cardiovascular:  Negative for chest pain.  Gastrointestinal:  Negative for abdominal pain, nausea and vomiting.  Musculoskeletal:        Right arm swelling, some discomfort but declines pain medication    Objective:   Vital Signs:  BP 124/77 (BP Location: Left Arm)   Pulse 69   Temp 98.9 F (37.2 C) (Oral)   Resp 18   Ht 5\' 11"  (1.803 m)   Wt 78 kg   SpO2 98%   BMI 23.98 kg/m   Physical Exam Vitals and nursing note reviewed.  Constitutional:  General: He is sleeping. He is not in acute distress.    Appearance: He is ill-appearing.  HENT:     Head: Normocephalic and atraumatic.  Cardiovascular:     Rate and Rhythm: Regular rhythm. Tachycardia present.  Pulmonary:     Effort: Pulmonary effort is normal. No respiratory distress.  Abdominal:     General: Abdomen is flat. Bowel sounds are normal. There is no distension.     Palpations: Abdomen is soft.  Skin:    General: Skin is warm and dry.  Neurological:     General:  No focal deficit present.     Mental Status: He is oriented to person, place, and time.  Psychiatric:        Mood and Affect: Mood normal.        Behavior: Behavior normal.     Palliative Assessment/Data: 60-70%    Existing Vynca/ACP Documentation: None  Assessment & Plan:   Impression: Present on Admission:  Persistent atrial fibrillation (HCC)  Atrial fibrillation with RVR (HCC)  83 year old male with acute presentation chronic comorbidities as described above. Patient and daughter [name] are very well aware of his current clinical situation and the predicament this process.  He appears stable and feels well off milrinone, although his labs are worse.  Patient and family understand he is approaching end-of-life and have agreed to referral for inpatient hospice.  They understand he is high risk for sudden decompensation and subsequently we have shifted to a more comfort focused approach.  At this point disposition plan is to SNF/rehab with recommended outpatient palliative care for close monitoring for decompensation.  Overall long-term prognosis poor   SUMMARY OF RECOMMENDATIONS   Changed to DNR-comfort Patient remains not a candidate for inpatient hospice Anticipate discharge to SNF/rehab today Continue comfort care while inpatient [facility] consult for referral to outpatient palliative care with hospice in the [facility] Continued emotional and spiritual support of patient and family  Symptom Management:  Dilaudid PRN pain/dyspnea/increased work of breathing/RR>25 Tylenol PRN pain/fever Biotin PRN dry mouth Robinul PRN secretions Haldol PRN agitation/delirium Ativan PRN anxiety/seizure/sleep/distress Zofran PRN nausea/vomiting Liquifilm Tears PRN dry eye Continue amiodarone, gabapentin, inhalers, Singulair, torsemide for comfort    Code Status: DNR-interventions desired  Prognosis: < 4 weeks  Discharge Planning: To Be Determined  Discussed with: Patient, patient's family,  medical team, nursing team, hospice liaison  Thank you for allowing [place] to participate in the care of Jasson E [name] PMT will continue to support holistically.  Time Total: 28 min  Detailed review of medical records (labs, imaging, vital signs), medically appropriate exam, discussed with treatment team, counseling and education to patient, family, & staff, documenting clinical information, medication management, coordination of care  Wynne Dust, NP Palliative Medicine Team  Team Phone # [phone] (Nights/Weekends)  11/11/2020, 8:17 AM

## 2023-04-28 NOTE — TOC Transition Note (Signed)
Transition of Care [facility]) - Discharge Note   Patient Details  Name: Brandon [name] MRN: [id number] Date of Birth: [birth year]  [facility] CM/SW Contact:  Nicanor Bake Phone Number: [phone] 04/28/2023, 11:13 AM   Clinical Narrative:   Pt will be traveling by PT[facility] to [facility]. Pts family has been notified. PTAR paperwork has been left on the pts chart.   [place] SN[facility] number to call for report is [phone] Room #211.     Final next level of care: Home w Hospice Care Barriers to Discharge: Continued Medical Work up   Patient Goals and CMS Choice Patient states their goals for this hospitalization and ongoing recovery are:: wants to get better CMS Medicare.gov Compare Post Acute Care list provided to:: Patient Represenative (must comment) Choice offered to / list presented to : Spouse      Discharge Placement                       Discharge Plan and Services Additional resources added to the After Visit Summary for     Discharge Planning Services: CM Consult Post Acute Care Choice: Hospice                    HH Arranged: RN [facility] Agency: [facility] [place] Date HH Agency Contacted: 04/25/23 Time HH Agency Contacted: 1101 Representative spoke with at [facility] Agency: Norm Parcel  Social Drivers of Health (SDOH) Interventions SDOH Screenings   Food Insecurity: No Food Insecurity (04/16/2023)  Housing: Low Risk  (04/17/2023)  Transportation Needs: No Transportation Needs (04/16/2023)  Utilities: Not At Risk (04/16/2023)  Alcohol Screen: Low Risk  (07/01/2022)  Depression (PHQ2-9): Low Risk  (02/03/2023)  Financial Resource Strain: Low Risk  (01/13/2023)  Physical Activity: Insufficiently Active (07/25/2020)  Social Connections: Socially Integrated (04/16/2023)  Stress: No Stress Concern Present (05/28/2021)  Tobacco Use: Medium Risk (04/19/2023)  Health Literacy: Adequate Health Literacy (01/27/2023)     Readmission Risk  Interventions     No data to display

## 2023-04-29 DIAGNOSIS — R5381 Other malaise: Secondary | ICD-10-CM | POA: Diagnosis not present

## 2023-04-29 DIAGNOSIS — I480 Paroxysmal atrial fibrillation: Secondary | ICD-10-CM | POA: Diagnosis not present

## 2023-04-29 DIAGNOSIS — I272 Pulmonary hypertension, unspecified: Secondary | ICD-10-CM | POA: Diagnosis not present

## 2023-04-29 DIAGNOSIS — J449 Chronic obstructive pulmonary disease, unspecified: Secondary | ICD-10-CM | POA: Diagnosis not present

## 2023-04-29 DIAGNOSIS — N184 Chronic kidney disease, stage 4 (severe): Secondary | ICD-10-CM | POA: Diagnosis not present

## 2023-04-29 DIAGNOSIS — Z8673 Personal history of transient ischemic attack (TIA), and cerebral infarction without residual deficits: Secondary | ICD-10-CM | POA: Diagnosis not present

## 2023-04-29 DIAGNOSIS — I714 Abdominal aortic aneurysm, without rupture, unspecified: Secondary | ICD-10-CM | POA: Diagnosis not present

## 2023-04-29 DIAGNOSIS — R2231 Localized swelling, mass and lump, right upper limb: Secondary | ICD-10-CM | POA: Diagnosis not present

## 2023-04-29 DIAGNOSIS — I130 Hypertensive heart and chronic kidney disease with heart failure and stage 1 through stage 4 chronic kidney disease, or unspecified chronic kidney disease: Secondary | ICD-10-CM | POA: Diagnosis not present

## 2023-04-29 DIAGNOSIS — I5020 Unspecified systolic (congestive) heart failure: Secondary | ICD-10-CM | POA: Diagnosis not present

## 2023-04-29 DIAGNOSIS — I429 Cardiomyopathy, unspecified: Secondary | ICD-10-CM | POA: Diagnosis not present

## 2023-05-03 ENCOUNTER — Other Ambulatory Visit: Payer: Self-pay

## 2023-05-03 ENCOUNTER — Emergency Department (HOSPITAL_COMMUNITY): Payer: Medicare Other

## 2023-05-03 ENCOUNTER — Encounter (HOSPITAL_COMMUNITY): Payer: Self-pay

## 2023-05-03 ENCOUNTER — Inpatient Hospital Stay (HOSPITAL_COMMUNITY)
Admission: EM | Admit: 2023-05-03 | Discharge: 2023-05-09 | DRG: 300 | Disposition: A | Discharge status: Skilled Nursing Facility | Payer: Medicare Other | Source: Skilled Nursing Facility | Attending: Internal Medicine | Admitting: Internal Medicine

## 2023-05-03 DIAGNOSIS — Z66 Do not resuscitate: Secondary | ICD-10-CM | POA: Diagnosis present

## 2023-05-03 DIAGNOSIS — I82621 Acute embolism and thrombosis of deep veins of right upper extremity: Principal | ICD-10-CM

## 2023-05-03 DIAGNOSIS — I82B11 Acute embolism and thrombosis of right subclavian vein: Secondary | ICD-10-CM | POA: Diagnosis not present

## 2023-05-03 DIAGNOSIS — I130 Hypertensive heart and chronic kidney disease with heart failure and stage 1 through stage 4 chronic kidney disease, or unspecified chronic kidney disease: Secondary | ICD-10-CM | POA: Diagnosis not present

## 2023-05-03 DIAGNOSIS — I639 Cerebral infarction, unspecified: Secondary | ICD-10-CM | POA: Diagnosis present

## 2023-05-03 DIAGNOSIS — I5032 Chronic diastolic (congestive) heart failure: Secondary | ICD-10-CM | POA: Diagnosis not present

## 2023-05-03 DIAGNOSIS — Z743 Need for continuous supervision: Secondary | ICD-10-CM | POA: Diagnosis not present

## 2023-05-03 DIAGNOSIS — J449 Chronic obstructive pulmonary disease, unspecified: Secondary | ICD-10-CM | POA: Diagnosis not present

## 2023-05-03 DIAGNOSIS — N184 Chronic kidney disease, stage 4 (severe): Secondary | ICD-10-CM | POA: Diagnosis not present

## 2023-05-03 DIAGNOSIS — I2721 Secondary pulmonary arterial hypertension: Secondary | ICD-10-CM | POA: Diagnosis present

## 2023-05-03 DIAGNOSIS — Z7951 Long term (current) use of inhaled steroids: Secondary | ICD-10-CM | POA: Diagnosis not present

## 2023-05-03 DIAGNOSIS — R54 Age-related physical debility: Secondary | ICD-10-CM | POA: Diagnosis present

## 2023-05-03 DIAGNOSIS — Z8673 Personal history of transient ischemic attack (TIA), and cerebral infarction without residual deficits: Secondary | ICD-10-CM

## 2023-05-03 DIAGNOSIS — I10 Essential (primary) hypertension: Secondary | ICD-10-CM | POA: Diagnosis present

## 2023-05-03 DIAGNOSIS — G629 Polyneuropathy, unspecified: Secondary | ICD-10-CM

## 2023-05-03 DIAGNOSIS — E785 Hyperlipidemia, unspecified: Secondary | ICD-10-CM | POA: Diagnosis not present

## 2023-05-03 DIAGNOSIS — I429 Cardiomyopathy, unspecified: Secondary | ICD-10-CM

## 2023-05-03 DIAGNOSIS — Z888 Allergy status to other drugs, medicaments and biological substances status: Secondary | ICD-10-CM

## 2023-05-03 DIAGNOSIS — Z8709 Personal history of other diseases of the respiratory system: Secondary | ICD-10-CM | POA: Diagnosis not present

## 2023-05-03 DIAGNOSIS — Z515 Encounter for palliative care: Secondary | ICD-10-CM | POA: Diagnosis not present

## 2023-05-03 DIAGNOSIS — Z87891 Personal history of nicotine dependence: Secondary | ICD-10-CM | POA: Diagnosis not present

## 2023-05-03 DIAGNOSIS — Z833 Family history of diabetes mellitus: Secondary | ICD-10-CM | POA: Diagnosis not present

## 2023-05-03 DIAGNOSIS — Z7901 Long term (current) use of anticoagulants: Secondary | ICD-10-CM | POA: Diagnosis not present

## 2023-05-03 DIAGNOSIS — I5022 Chronic systolic (congestive) heart failure: Secondary | ICD-10-CM | POA: Diagnosis present

## 2023-05-03 DIAGNOSIS — E1165 Type 2 diabetes mellitus with hyperglycemia: Secondary | ICD-10-CM | POA: Diagnosis not present

## 2023-05-03 DIAGNOSIS — I428 Other cardiomyopathies: Secondary | ICD-10-CM | POA: Diagnosis not present

## 2023-05-03 DIAGNOSIS — Z7189 Other specified counseling: Secondary | ICD-10-CM | POA: Diagnosis not present

## 2023-05-03 DIAGNOSIS — Z7984 Long term (current) use of oral hypoglycemic drugs: Secondary | ICD-10-CM | POA: Diagnosis not present

## 2023-05-03 DIAGNOSIS — Z825 Family history of asthma and other chronic lower respiratory diseases: Secondary | ICD-10-CM

## 2023-05-03 DIAGNOSIS — R601 Generalized edema: Secondary | ICD-10-CM | POA: Diagnosis not present

## 2023-05-03 DIAGNOSIS — E1140 Type 2 diabetes mellitus with diabetic neuropathy, unspecified: Secondary | ICD-10-CM | POA: Diagnosis not present

## 2023-05-03 DIAGNOSIS — G4730 Sleep apnea, unspecified: Secondary | ICD-10-CM | POA: Diagnosis present

## 2023-05-03 DIAGNOSIS — G4733 Obstructive sleep apnea (adult) (pediatric): Secondary | ICD-10-CM | POA: Diagnosis present

## 2023-05-03 DIAGNOSIS — I272 Pulmonary hypertension, unspecified: Secondary | ICD-10-CM | POA: Insufficient documentation

## 2023-05-03 DIAGNOSIS — Z8269 Family history of other diseases of the musculoskeletal system and connective tissue: Secondary | ICD-10-CM

## 2023-05-03 DIAGNOSIS — Z79899 Other long term (current) drug therapy: Secondary | ICD-10-CM

## 2023-05-03 DIAGNOSIS — I5084 End stage heart failure: Secondary | ICD-10-CM | POA: Diagnosis not present

## 2023-05-03 DIAGNOSIS — I2510 Atherosclerotic heart disease of native coronary artery without angina pectoris: Secondary | ICD-10-CM | POA: Diagnosis present

## 2023-05-03 DIAGNOSIS — E1122 Type 2 diabetes mellitus with diabetic chronic kidney disease: Secondary | ICD-10-CM | POA: Diagnosis present

## 2023-05-03 DIAGNOSIS — R222 Localized swelling, mass and lump, trunk: Secondary | ICD-10-CM | POA: Diagnosis not present

## 2023-05-03 DIAGNOSIS — I480 Paroxysmal atrial fibrillation: Secondary | ICD-10-CM | POA: Diagnosis present

## 2023-05-03 DIAGNOSIS — Z807 Family history of other malignant neoplasms of lymphoid, hematopoietic and related tissues: Secondary | ICD-10-CM

## 2023-05-03 DIAGNOSIS — R739 Hyperglycemia, unspecified: Secondary | ICD-10-CM | POA: Diagnosis present

## 2023-05-03 DIAGNOSIS — I499 Cardiac arrhythmia, unspecified: Secondary | ICD-10-CM | POA: Diagnosis not present

## 2023-05-03 HISTORY — DX: Acute embolism and thrombosis of deep veins of right upper extremity: I82.621

## 2023-05-03 LAB — COMPREHENSIVE METABOLIC PANEL
ALT: 31 U/L (ref 0–44)
AST: 23 U/L (ref 15–41)
Albumin: 2.6 g/dL — ABNORMAL LOW (ref 3.5–5.0)
Alkaline Phosphatase: 70 U/L (ref 38–126)
Anion gap: 13 (ref 5–15)
BUN: 59 mg/dL — ABNORMAL HIGH (ref 8–23)
CO2: 25 mmol/L (ref 22–32)
Calcium: 9.9 mg/dL (ref 8.9–10.3)
Chloride: 100 mmol/L (ref 98–111)
Creatinine, Ser: 2.65 mg/dL — ABNORMAL HIGH (ref 0.61–1.24)
GFR, Estimated: 23 mL/min — ABNORMAL LOW (ref >60)
Glucose, Bld: 108 mg/dL — ABNORMAL HIGH (ref 70–99)
Potassium: 4.4 mmol/L (ref 3.5–5.1)
Sodium: 138 mmol/L (ref 135–145)
Total Bilirubin: 1.8 mg/dL — ABNORMAL HIGH (ref 0.0–1.2)
Total Protein: 6 g/dL — ABNORMAL LOW (ref 6.5–8.1)

## 2023-05-03 LAB — CBC WITH DIFFERENTIAL/PLATELET
Abs Immature Granulocytes: 0.04 10*3/uL (ref 0.00–0.07)
Basophils Absolute: 0 10*3/uL (ref 0.0–0.1)
Basophils Relative: 0 %
Eosinophils Absolute: 0 10*3/uL (ref 0.0–0.5)
Eosinophils Relative: 0 %
HCT: 42.1 % (ref 39.0–52.0)
Hemoglobin: 13.7 g/dL (ref 13.0–17.0)
Immature Granulocytes: 0 %
Lymphocytes Relative: 5 %
Lymphs Abs: 0.5 10*3/uL — ABNORMAL LOW (ref 0.7–4.0)
MCH: 31.5 pg (ref 26.0–34.0)
MCHC: 32.5 g/dL (ref 30.0–36.0)
MCV: 96.8 fL (ref 80.0–100.0)
Monocytes Absolute: 0.7 10*3/uL (ref 0.1–1.0)
Monocytes Relative: 7 %
Neutro Abs: 8.8 10*3/uL — ABNORMAL HIGH (ref 1.7–7.7)
Neutrophils Relative %: 88 %
Platelets: 189 10*3/uL (ref 150–400)
RBC: 4.35 MIL/uL (ref 4.22–5.81)
RDW: 15.1 % (ref 11.5–15.5)
WBC: 10.1 10*3/uL (ref 4.0–10.5)
nRBC: 0 % (ref 0.0–0.2)

## 2023-05-03 LAB — APTT: aPTT: 45 s — ABNORMAL HIGH (ref 24–36)

## 2023-05-03 LAB — PROTIME-INR
INR: 2.6 — ABNORMAL HIGH (ref 0.8–1.2)
Prothrombin Time: 28 s — ABNORMAL HIGH (ref 11.4–15.2)

## 2023-05-03 MED ORDER — HEPARIN (PORCINE) 25000 UT/250ML-% IV SOLN
1200.0000 [IU]/h | INTRAVENOUS | Status: DC
  Administered 2023-05-03: 1200 [IU]/h via INTRAVENOUS

## 2023-05-03 MED ORDER — HEPARIN BOLUS VIA INFUSION
3000.0000 [IU] | Freq: Once | INTRAVENOUS | Status: AC
Start: 2023-05-03 — End: 2023-05-03
  Administered 2023-05-03: 3000 [IU] via INTRAVENOUS
  Filled 2023-05-03: qty 3000

## 2023-05-03 MED ORDER — HEPARIN BOLUS VIA INFUSION
3000.0000 [IU] | Freq: Once | INTRAVENOUS | Status: DC
  Filled 2023-05-03: qty 3000

## 2023-05-03 NOTE — ED Notes (Signed)
 Patient transported to CT

## 2023-05-03 NOTE — ED Triage Notes (Signed)
Pt from [facility] in [place], BIB [facility] for a DVT in his right arm. Pt was seen outpatient today to have a venous US that confirmed a DVT to the right arm, and was told to increase Eliquis 10 mg BID for 1 week, then decrease back to his regular Eliquis 5 mg BID and f/u outpatient with his cardiologist, but facility wanted pt brought in for further evaluation after he return to the facility from having his [place].   Pt is a DNR A&O at baseline EMS vitals 106/70 84 HR 96% RA

## 2023-05-03 NOTE — Progress Notes (Addendum)
PHARMACY - ANTICOAGULATION CONSULT NOTE  Pharmacy Consult for heparin infusion Indication:  DVT right upper extremity  Allergies  Allergen Reactions   Ace Inhibitors Cough   Isosorb Dinitrate-Hydralazine Other (See Comments)    dizziness/hypotensive    Patient Measurements: Height: 5\' 11"  (180.3 cm) Weight: 78 kg (171 lb 15.3 oz) IBW/kg (Calculated) : 75.3 Heparin Dosing Weight: 78 kg  Vital Signs: Temp: 98.3 F (36.8 C) (02/18 1940) Temp Source: Oral (02/18 1940) BP: 114/84 (02/18 1940) Pulse Rate: 74 (02/18 1940)  Labs: No results for input(s): "HGB", "HCT", "PLT", "APTT", "LABPROT", "INR", "HEPARINUNFRC", "HEPRLOWMOCWT", "CREATININE", "CKTOTAL", "CKMB", "TROPONINIHS" in the last 72 hours.  Estimated Creatinine Clearance: 18.3 mL/min (A) (by C-G formula based on SCr of 3.32 mg/dL (H)).   Medical History: Past Medical History:  Diagnosis Date   Anemia    Arm DVT (deep venous thromboembolism), acute, right (HCC)    Asthma    Hx of childhood asthma, disappeared for a while, then resurfaced 6-7 years ago.    Cardiomyopathy    with a negative cardiac catheterization in the past. 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     Medications:  Scheduled:  Infusions:   Assessment: 83 yo M presenting with concern for DVT in his right arm. 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 Last dose of Eliquis was at previous hospital admission on 04/23/23. Notably discharge summary on 2/17 mentioned discontinuing Eliquis therapy for comfort measures. Pharmacy consulted for heparin management.  CBC is being drawn, not resulted yet. Given older age will given smaller bolus.   Goal of Therapy:  Heparin level 0.3-0.7 units/ml Monitor  platelets by anticoagulation protocol: Yes   Plan:  Give heparin bolus 3000 unit bolus x1 Start heparin infusion at 1200 units/hr Check 8 hour heparin level Monitor daily heparin level, CBC, and signs/symptoms of bleeding F/u GOC discussion   Ernestene Kiel, PharmD PGY1 Pharmacy Resident  Please check AMION for all [facility] Pharmacy phone numbers After 10:00 PM, call Main Pharmacy [phone] 05/03/2023,9:26 PM

## 2023-05-03 NOTE — ED Provider Notes (Signed)
[facility] AT [facility] Provider Note   CSN: [id number] Arrival date & time: 05/03/23  4401     History {Add pertinent medical, surgical, social history, OB history to HPI:1} Chief Complaint  Patient presents with   Right Arm Swelling    Brandon [name] is a 83 y.o. male with A-fib who (used to be?) on Eliquis, end-stage heart failure on comfort care, pretension, CKD, COPD, pulmonary hypertension, OSA, presents with concern for DVT to his right arm.  States he got an ultrasound of his arm earlier at outside facility [facility]) which was positive for DVT.  He reports that the swelling to his right forearm has been there for at least 1 to 2 weeks.  His daughter was concerned about this finding and wanted him to come to the ER for further evaluation.  Patient denies any chest pain or shortness of breath.  Upon review of discharge summary from 04/28/2023 when he was in the hospital for heart failure, it appears he was taken off of his Eliquis due to him being transition to palliative care  HPI     Home Medications Prior to Admission medications   Medication Sig Start Date End Date Taking? Authorizing Provider  albuterol (PROVENTIL) (2.5 MG/3ML) 0.083% nebulizer solution TAKE 3 MLS BY NEBULIZER EVERY 6 HOURS AS NEEDED FOR WHEEZING FOR SHORTNESS OF BREATH 02/23/22   [name], NP  amiodarone (PACERONE) 200 MG tablet Take 1 tablet (200 mg total) by mouth daily. 04/28/23   Robbie Lis M, PA-C  fluticasone-salmeterol (ADVAIR) 500-50 MCG/ACT AEPB Inhale 1 puff into the lungs in the morning and at bedtime. Patient taking differently: Inhale 2 puffs into the lungs in the morning and at bedtime. 06/29/22   [name], NP  gabapentin (NEURONTIN) 300 MG capsule Take 1 capsule (300 mg total) by mouth at bedtime. 12/20/22   [name], NP  torsemide (DEMADEX) 20 MG tablet Take 2 tablets (40 mg total) by mouth daily. 04/28/23    Allayne Butcher, PA-C      Allergies    Ace inhibitors and Isosorb dinitrate-hydralazine    Review of Systems   Review of Systems  Musculoskeletal:        Right forearm swelling    Physical Exam Updated Vital Signs Ht 5\' 11"  (1.803 m)   Wt 78 kg   SpO2 96%   BMI 23.98 kg/m  Physical Exam Vitals and nursing note reviewed.  Constitutional:      General: He is not in acute distress.    Appearance: He is well-developed.  HENT:     Head: Normocephalic and atraumatic.  Eyes:     Conjunctiva/sclera: Conjunctivae normal.  Cardiovascular:     Rate and Rhythm: Normal rate and regular rhythm.     Heart sounds: No murmur heard.    Comments: 2+ radial pulses bilaterally Pulmonary:     Effort: Pulmonary effort is normal. No respiratory distress.     Breath sounds: Normal breath sounds.  Abdominal:     Palpations: Abdomen is soft.     Tenderness: There is no abdominal tenderness.  Musculoskeletal:        General: No swelling.     Cervical back: Neck supple.     Comments: Area of edema over the right forearm, this area is tender to palpation.  No overlying erythema or red streaks.  No areas of fluctuance.  Skin:    General: Skin is warm and dry.  Capillary Refill: Capillary refill takes less than 2 seconds.  Neurological:     Mental Status: He is alert.  Psychiatric:        Mood and Affect: Mood normal.     ED Results / Procedures / Treatments   Labs (all labs ordered are listed, but only abnormal results are displayed) Labs Reviewed - No data to display  EKG None  Radiology No results found.  Procedures Procedures  {Document cardiac monitor, telemetry assessment procedure when appropriate:1}  Medications Ordered in ED Medications - No data to display  ED Course/ Medical Decision Making/ A&P   {   Click here for ABCD2, HEART and other calculatorsREFRESH Note before signing :1}                              Medical Decision Making Amount and/or  Complexity of Data Reviewed Labs: ordered. Radiology: ordered.  Risk Prescription drug management.     Differential diagnosis includes but is not limited to DVT, PE, malignancy  ED Course:  Patient overall well-appearing, stable vital signs.  He presents with concern for DVT that was found earlier on an outside ultrasound.  According to patient, his swelling in the right arm was therefore at least 1-2 weeks, and was present on his recent admission.  He was taking Eliquis up to this admission, but his Eliquis was discontinued on discharge due to palliative care efforts for end stage heart failure.  He denies any chest pain or shortness of breath, lower concern for PE at this time.  Vascular surgeon Dr. Randie Heinz was consulted, he recommended obtaining CT chest for further evaluation to make sure there was not any malignancy or other mass that could be contributing to his DVT given he was on eliquis when patient first noted symptoms.  Since this is a failure of Eliquis therapy, he also recommends starting patient on heparin and admitting for further management. When talking to patient and family, they would like to proceed with workup and treatment of his DVT.  Will start him on heparin per recommendation of Dr. Randie Heinz.  I placed heparin ordered per pharmacy consult.  Patient and family seem upset that some of his medications, including Eliquis, were stopped after his most recent hospitalization.  He may need further goals of care conversation  inpatient. I Ordered, and personally interpreted labs.  The pertinent results include:   CBC without any abnormalities CMP with elevation in creatinine at 2.65, this is improved from previous draw at 3.32 taken 5 days ago.  T. bili slightly elevated 1.8. PT/INR 2.6, APTT 45.  These labs were drawn prior to initiating heparin drip in the ER. Upon re-evaluation, patient ***.    Impression: ***  Disposition:  {AF ED Dispo:29713} Return precautions  given.  Imaging Studies ordered: I ordered imaging studies including CT chest  I independently visualized the imaging with scope of interpretation limited to determining acute life threatening conditions related to emergency care. Imaging showed *** I agree with the radiologist interpretation   Consultations Obtained: I requested consultation with the vascular surgeon Dr. Randie Heinz,  and discussed lab and imaging findings as well as pertinent plan - they recommend: Obtaining CT chest for further evaluation to rule out any obstructive cause of his DVT.  He recommends admitting patient to start heparin.  External records from outside source obtained and reviewed including hospital discharge summary from 04/28/2023 where he was seen for cardiogenic shock in the  setting of recurrent A-fib.  Given his advanced end-stage heart failure, palliative care team was consulted and patient and family at that time opted to transition to comfort care.  His Eliquis was discontinued on discharge.        {Document critical care time when appropriate:1} {Document review of labs and clinical decision tools ie heart score, Chads2Vasc2 etc:1}  {Document your independent review of radiology images, and any outside records:1} {Document your discussion with family members, caretakers, and with consultants:1} {Document social determinants of health affecting pt's care:1} {Document your decision making why or why not admission, treatments were needed:1} Final Clinical Impression(s) / ED Diagnoses Final diagnoses:  None    Rx / DC Orders ED Discharge Orders     None

## 2023-05-04 ENCOUNTER — Telehealth: Payer: Self-pay | Admitting: Cardiology

## 2023-05-04 DIAGNOSIS — N184 Chronic kidney disease, stage 4 (severe): Secondary | ICD-10-CM | POA: Diagnosis present

## 2023-05-04 DIAGNOSIS — Z87891 Personal history of nicotine dependence: Secondary | ICD-10-CM | POA: Diagnosis not present

## 2023-05-04 DIAGNOSIS — G9009 Other idiopathic peripheral autonomic neuropathy: Secondary | ICD-10-CM | POA: Diagnosis not present

## 2023-05-04 DIAGNOSIS — Z8709 Personal history of other diseases of the respiratory system: Secondary | ICD-10-CM

## 2023-05-04 DIAGNOSIS — I82621 Acute embolism and thrombosis of deep veins of right upper extremity: Secondary | ICD-10-CM

## 2023-05-04 DIAGNOSIS — I4811 Longstanding persistent atrial fibrillation: Secondary | ICD-10-CM | POA: Diagnosis not present

## 2023-05-04 DIAGNOSIS — Z833 Family history of diabetes mellitus: Secondary | ICD-10-CM | POA: Diagnosis not present

## 2023-05-04 DIAGNOSIS — I272 Pulmonary hypertension, unspecified: Secondary | ICD-10-CM | POA: Insufficient documentation

## 2023-05-04 DIAGNOSIS — I130 Hypertensive heart and chronic kidney disease with heart failure and stage 1 through stage 4 chronic kidney disease, or unspecified chronic kidney disease: Secondary | ICD-10-CM | POA: Diagnosis present

## 2023-05-04 DIAGNOSIS — I480 Paroxysmal atrial fibrillation: Secondary | ICD-10-CM | POA: Diagnosis not present

## 2023-05-04 DIAGNOSIS — E1122 Type 2 diabetes mellitus with diabetic chronic kidney disease: Secondary | ICD-10-CM | POA: Diagnosis present

## 2023-05-04 DIAGNOSIS — Z7189 Other specified counseling: Secondary | ICD-10-CM | POA: Diagnosis not present

## 2023-05-04 DIAGNOSIS — E119 Type 2 diabetes mellitus without complications: Secondary | ICD-10-CM | POA: Diagnosis not present

## 2023-05-04 DIAGNOSIS — I428 Other cardiomyopathies: Secondary | ICD-10-CM | POA: Diagnosis present

## 2023-05-04 DIAGNOSIS — G4733 Obstructive sleep apnea (adult) (pediatric): Secondary | ICD-10-CM | POA: Diagnosis present

## 2023-05-04 DIAGNOSIS — I2721 Secondary pulmonary arterial hypertension: Secondary | ICD-10-CM | POA: Diagnosis present

## 2023-05-04 DIAGNOSIS — Z79899 Other long term (current) drug therapy: Secondary | ICD-10-CM | POA: Diagnosis not present

## 2023-05-04 DIAGNOSIS — I5032 Chronic diastolic (congestive) heart failure: Secondary | ICD-10-CM | POA: Diagnosis not present

## 2023-05-04 DIAGNOSIS — I82A11 Acute embolism and thrombosis of right axillary vein: Secondary | ICD-10-CM | POA: Diagnosis not present

## 2023-05-04 DIAGNOSIS — R531 Weakness: Secondary | ICD-10-CM | POA: Diagnosis not present

## 2023-05-04 DIAGNOSIS — Z743 Need for continuous supervision: Secondary | ICD-10-CM | POA: Diagnosis not present

## 2023-05-04 DIAGNOSIS — I5084 End stage heart failure: Secondary | ICD-10-CM | POA: Diagnosis not present

## 2023-05-04 DIAGNOSIS — D649 Anemia, unspecified: Secondary | ICD-10-CM | POA: Diagnosis not present

## 2023-05-04 DIAGNOSIS — I2510 Atherosclerotic heart disease of native coronary artery without angina pectoris: Secondary | ICD-10-CM | POA: Diagnosis present

## 2023-05-04 DIAGNOSIS — I2723 Pulmonary hypertension due to lung diseases and hypoxia: Secondary | ICD-10-CM | POA: Diagnosis not present

## 2023-05-04 DIAGNOSIS — I82B11 Acute embolism and thrombosis of right subclavian vein: Secondary | ICD-10-CM | POA: Diagnosis present

## 2023-05-04 DIAGNOSIS — Z7951 Long term (current) use of inhaled steroids: Secondary | ICD-10-CM | POA: Diagnosis not present

## 2023-05-04 DIAGNOSIS — I429 Cardiomyopathy, unspecified: Secondary | ICD-10-CM | POA: Diagnosis not present

## 2023-05-04 DIAGNOSIS — E1140 Type 2 diabetes mellitus with diabetic neuropathy, unspecified: Secondary | ICD-10-CM | POA: Diagnosis present

## 2023-05-04 DIAGNOSIS — I5022 Chronic systolic (congestive) heart failure: Secondary | ICD-10-CM | POA: Diagnosis present

## 2023-05-04 DIAGNOSIS — I10 Essential (primary) hypertension: Secondary | ICD-10-CM | POA: Diagnosis not present

## 2023-05-04 DIAGNOSIS — I4891 Unspecified atrial fibrillation: Secondary | ICD-10-CM | POA: Diagnosis not present

## 2023-05-04 DIAGNOSIS — Z66 Do not resuscitate: Secondary | ICD-10-CM | POA: Diagnosis not present

## 2023-05-04 DIAGNOSIS — Z7901 Long term (current) use of anticoagulants: Secondary | ICD-10-CM | POA: Diagnosis not present

## 2023-05-04 DIAGNOSIS — Z8673 Personal history of transient ischemic attack (TIA), and cerebral infarction without residual deficits: Secondary | ICD-10-CM | POA: Diagnosis not present

## 2023-05-04 DIAGNOSIS — Z7401 Bed confinement status: Secondary | ICD-10-CM | POA: Diagnosis not present

## 2023-05-04 DIAGNOSIS — E785 Hyperlipidemia, unspecified: Secondary | ICD-10-CM | POA: Diagnosis not present

## 2023-05-04 DIAGNOSIS — Z515 Encounter for palliative care: Secondary | ICD-10-CM | POA: Diagnosis not present

## 2023-05-04 DIAGNOSIS — Z7984 Long term (current) use of oral hypoglycemic drugs: Secondary | ICD-10-CM | POA: Diagnosis not present

## 2023-05-04 DIAGNOSIS — J449 Chronic obstructive pulmonary disease, unspecified: Secondary | ICD-10-CM | POA: Diagnosis present

## 2023-05-04 DIAGNOSIS — J441 Chronic obstructive pulmonary disease with (acute) exacerbation: Secondary | ICD-10-CM | POA: Diagnosis not present

## 2023-05-04 DIAGNOSIS — E1165 Type 2 diabetes mellitus with hyperglycemia: Secondary | ICD-10-CM | POA: Diagnosis present

## 2023-05-04 DIAGNOSIS — M069 Rheumatoid arthritis, unspecified: Secondary | ICD-10-CM | POA: Diagnosis not present

## 2023-05-04 LAB — CBG MONITORING, ED: Glucose-Capillary: 90 mg/dL (ref 70–99)

## 2023-05-04 LAB — COMPREHENSIVE METABOLIC PANEL
ALT: 29 U/L (ref 0–44)
AST: 20 U/L (ref 15–41)
Albumin: 2.4 g/dL — ABNORMAL LOW (ref 3.5–5.0)
Alkaline Phosphatase: 64 U/L (ref 38–126)
Anion gap: 11 (ref 5–15)
BUN: 60 mg/dL — ABNORMAL HIGH (ref 8–23)
CO2: 22 mmol/L (ref 22–32)
Calcium: 9.8 mg/dL (ref 8.9–10.3)
Chloride: 102 mmol/L (ref 98–111)
Creatinine, Ser: 2.8 mg/dL — ABNORMAL HIGH (ref 0.61–1.24)
GFR, Estimated: 22 mL/min — ABNORMAL LOW (ref >60)
Glucose, Bld: 107 mg/dL — ABNORMAL HIGH (ref 70–99)
Potassium: 4.1 mmol/L (ref 3.5–5.1)
Sodium: 135 mmol/L (ref 135–145)
Total Bilirubin: 1.8 mg/dL — ABNORMAL HIGH (ref 0.0–1.2)
Total Protein: 5.6 g/dL — ABNORMAL LOW (ref 6.5–8.1)

## 2023-05-04 LAB — GLUCOSE, CAPILLARY
Glucose-Capillary: 101 mg/dL — ABNORMAL HIGH (ref 70–99)
Glucose-Capillary: 133 mg/dL — ABNORMAL HIGH (ref 70–99)

## 2023-05-04 LAB — CBC
HCT: 37.5 % — ABNORMAL LOW (ref 39.0–52.0)
Hemoglobin: 12.5 g/dL — ABNORMAL LOW (ref 13.0–17.0)
MCH: 31.5 pg (ref 26.0–34.0)
MCHC: 33.3 g/dL (ref 30.0–36.0)
MCV: 94.5 fL (ref 80.0–100.0)
Platelets: 198 10*3/uL (ref 150–400)
RBC: 3.97 MIL/uL — ABNORMAL LOW (ref 4.22–5.81)
RDW: 15.1 % (ref 11.5–15.5)
WBC: 8.7 10*3/uL (ref 4.0–10.5)
nRBC: 0 % (ref 0.0–0.2)

## 2023-05-04 LAB — HEPARIN LEVEL (UNFRACTIONATED)
Heparin Unfractionated: >1.1 [IU]/mL — ABNORMAL HIGH (ref 0.30–0.70)
Heparin Unfractionated: >1.1 [IU]/mL — ABNORMAL HIGH (ref 0.30–0.70)

## 2023-05-04 LAB — APTT: aPTT: 66 s — ABNORMAL HIGH (ref 24–36)

## 2023-05-04 MED ORDER — ONDANSETRON HCL 4 MG PO TABS
4.0000 mg | ORAL_TABLET | Freq: Four times a day (QID) | ORAL | Status: DC | PRN
  Administered 2023-05-08: 4 mg via ORAL
  Filled 2023-05-04: qty 1

## 2023-05-04 MED ORDER — MOMETASONE FURO-FORMOTEROL FUM 200-5 MCG/ACT IN AERO
2.0000 | INHALATION_SPRAY | Freq: Two times a day (BID) | RESPIRATORY_TRACT | Status: DC
  Administered 2023-05-04 – 2023-05-09 (×12): 2 via RESPIRATORY_TRACT
  Filled 2023-05-04: qty 8.8

## 2023-05-04 MED ORDER — ACETAMINOPHEN 650 MG RE SUPP: 650.0000 mg | Freq: Four times a day (QID) | RECTAL | Status: DC | PRN

## 2023-05-04 MED ORDER — SODIUM CHLORIDE 0.9% FLUSH
3.0000 mL | Freq: Two times a day (BID) | INTRAVENOUS | Status: DC
  Administered 2023-05-04 – 2023-05-09 (×9): 3 mL via INTRAVENOUS

## 2023-05-04 MED ORDER — SODIUM CHLORIDE 0.9% FLUSH: 3.0000 mL | INTRAVENOUS | Status: DC | PRN

## 2023-05-04 MED ORDER — HEPARIN (PORCINE) 25000 UT/250ML-% IV SOLN
1100.0000 [IU]/h | INTRAVENOUS | Status: DC
  Administered 2023-05-04: 1100 [IU]/h via INTRAVENOUS
  Filled 2023-05-04: qty 250

## 2023-05-04 MED ORDER — TORSEMIDE 20 MG PO TABS
40.0000 mg | ORAL_TABLET | Freq: Every day | ORAL | Status: DC
  Administered 2023-05-04 – 2023-05-09 (×6): 40 mg via ORAL
  Filled 2023-05-04 (×7): qty 2

## 2023-05-04 MED ORDER — ACETAMINOPHEN 325 MG PO TABS
650.0000 mg | ORAL_TABLET | Freq: Four times a day (QID) | ORAL | Status: DC | PRN
  Administered 2023-05-08 (×2): 650 mg via ORAL
  Filled 2023-05-04 (×2): qty 2

## 2023-05-04 MED ORDER — SODIUM CHLORIDE 0.9 % IV SOLN: 250.0000 mL | INTRAVENOUS | Status: AC | PRN

## 2023-05-04 MED ORDER — IPRATROPIUM-ALBUTEROL 0.5-2.5 (3) MG/3ML IN SOLN: 3.0000 mL | RESPIRATORY_TRACT | Status: DC | PRN

## 2023-05-04 MED ORDER — PANTOPRAZOLE SODIUM 40 MG PO TBEC
40.0000 mg | DELAYED_RELEASE_TABLET | Freq: Every day | ORAL | Status: DC
  Administered 2023-05-04 – 2023-05-09 (×6): 40 mg via ORAL
  Filled 2023-05-04 (×6): qty 1

## 2023-05-04 MED ORDER — GABAPENTIN 300 MG PO CAPS
300.0000 mg | ORAL_CAPSULE | Freq: Every day | ORAL | Status: DC
  Administered 2023-05-04 – 2023-05-08 (×6): 300 mg via ORAL
  Filled 2023-05-04 (×6): qty 1

## 2023-05-04 MED ORDER — AMIODARONE HCL 200 MG PO TABS
200.0000 mg | ORAL_TABLET | Freq: Every day | ORAL | Status: DC
  Administered 2023-05-04 – 2023-05-09 (×6): 200 mg via ORAL
  Filled 2023-05-04 (×6): qty 1

## 2023-05-04 MED ORDER — ONDANSETRON HCL 4 MG/2ML IJ SOLN
4.0000 mg | Freq: Four times a day (QID) | INTRAMUSCULAR | Status: DC | PRN
Start: 2023-05-04 — End: 2023-05-09
  Filled 2023-05-04: qty 2

## 2023-05-04 NOTE — Progress Notes (Signed)
Pt arrived to 6 north room 31 alert and oriented x4. Pain level 2/10. Hep drip running @10 .5. bed in lowest position. Call light in reach. Will continue to monitor pt.

## 2023-05-04 NOTE — H&P (Addendum)
History and Physical    Brandon [name] IEP:[id number] DOB: [birth year] DOA: 05/03/2023  PCP: [name], NP   Patient coming from: Nursing home   Chief Complaint:  Chief Complaint  Patient presents with   Right Arm Swelling   ED TRIAGE note:Pt from [facility] in [place], BIB [facility] for a DVT in his right arm. Pt was seen outpatient today to have a venous US that confirmed a DVT to the right arm, and was told to increase Eliquis 10 mg BID for 1 week, then decrease back to his regular Eliquis 5 mg BID and f/u outpatient with his cardiologist, but facility wanted pt brought in for further evaluation after he return to the facility from having his [place].   Pt is a DNR A&O at baseline EMS vitals 106/70 84 HR 96% R  HPI:  Brandon [name] is a 83 y.o. 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  Patient reported that he has been noticed some right upper arm swelling for last few days which has been progressively getting worse.  Denies any right upper extremity pain. Patient reported that he has very poor appetite and not has been eating well as well.  Feeling nauseated all the time.  Reported the he ambulates with the assistance and goes to bathroom with assistance.  Denies any chest pain, shortness of breath, palpitation, headache, syncope, presyncope, vomiting, constipation and diarrhea.  Patient was admitted 2/2 to 2/13 by cardiology service team given patient developed cardiogenic shock in the setting of atrial fibrillation.  Patient  underwent successful DCCV.  Due to cardiogenic shock patient was placed on milrinone drip developed bradycardia.  Ultimately patient weaned off milrinone.  Patient also developed AKI on CKD stage IV in the setting of cardiorenal syndrome.  Had a good discussion with the family during that time and patient has transition to DNR along with comfort symptom management.  Patient had a long discussion with cardiology team he was eventually to DNR/DNI with comfort measure and nursing home with hospice care.  Patient was discharged home with amiodarone, gabapentin, inhaler, Singulair and torsemide for comfort.  All other medication was discontinued which includes apixaban, Jardiance, folic acid, Toprol-XL, montelukast, omeprazole, rosuvastatin and tamsulosin.   ED Course:  At presentation to ED patient is hemodynamically stable. CMP showing creatinine and GFR at baseline low albumin 2.6 elevated bilirubin 1.8. Elevated APTT and INR. CBC unremarkable.  CTA chest: 1. Diffusely dilated, mildly hyperdense, and demonstrates extensive perivascular inflammatory stranding in keeping with changes of thrombosis and possible superimposed thrombophlebitis. A central obstructing lesion is not clearly identified. The innominate vein is of relatively narrow caliber, however, its patency is not well assessed on this noncontrast examination. If there is a question of chronic thrombosis of the innominate vein, patency of this vessel would better assessed with contrast enhanced CT venography. 2. Mild multi-vessel coronary artery calcification. Mild global cardiomegaly with left ventricular enlargement. 3. Morphologic changes in keeping with pulmonary arterial hypertension. 4. COPD.  Per chart review it seems like that during discharge on 04/28/2023 the Eliquis has been discontinued as patient has been transition  to DNR to comfort care however he had an outpatient ultrasound to his revealed that DVT of the right upper  extremity.  ED physician has been consulted and spoken with vascular surgeon Dr. Randie Heinz who recommended to start IV heparin drip and obtaining CTA chest.  After CT chest showed right subclavian vein DVT with thrombophlebitis.  Multivessel coronary artery disease.  Mild global cardiomegaly with ventricular enlargement.  Evidence of COPD  Vascular surgery recommended to treat with IV heparin drip and will discuss further treatment options with the patient's family in the morning.  Hospitalist has been contacted for further evaluation management of right upper extremity DVT.  Significant labs in the ED: Lab Orders         CBC with Differential         Comprehensive metabolic panel         APTT         Protime-INR         Heparin level (unfractionated)         Heparin level (unfractionated)         CBC         Comprehensive metabolic panel       Review of Systems:  Review of Systems  Constitutional:  Positive for malaise/fatigue. Negative for chills, fever and weight loss.  Eyes:  Negative for blurred vision and double vision.  Respiratory:  Negative for cough, sputum production and shortness of breath.   Cardiovascular:  Negative for chest pain, palpitations, orthopnea, claudication, leg swelling and PND.  Gastrointestinal:  Positive for nausea. Negative for abdominal pain, heartburn and vomiting.  Genitourinary:  Negative for dysuria, frequency, hematuria and urgency.  Musculoskeletal:  Negative for back pain, falls, joint pain, myalgias and neck pain.  Neurological:  Negative for dizziness, tingling, tremors, sensory change, speech change, weakness and headaches.  All other systems reviewed and are negative.   Past Medical History:  Diagnosis Date   Anemia    Arm DVT (deep venous thromboembolism), acute, right (HCC)    Asthma    Hx of childhood asthma, disappeared for a while, then resurfaced 6-7 years ago.    Cardiomyopathy    with a negative cardiac catheterization in the  past. 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     Past Surgical History:  Procedure Laterality Date   CARDIOVERSION N/A 02/28/2023   Procedure: CARDIOVERSION;  Surgeon: Lewayne Bunting, MD;  Location: [facility] INVASIVE CV LAB;  Service: Cardiovascular;  Laterality: N/A;   CARDIOVERSION N/A 04/19/2023   Procedure: CARDIOVERSION;  Surgeon: Dorthula Nettles, DO;  Location: [facility] INVASIVE CV LAB;  Service: Cardiovascular;  Laterality: N/A;   None     RIGHT HEART CATH N/A 01/13/2023   Procedure: RIGHT HEART CATH;  Surgeon: Dorthula Nettles, DO;  Location: [facility] INVASIVE CV LAB;  Service: Cardiovascular;  Laterality: N/A;   TRANSESOPHAGEAL ECHOCARDIOGRAM (CATH LAB) N/A 01/14/2023   Procedure: TRANSESOPHAGEAL ECHOCARDIOGRAM;  Surgeon: Thurmon Fair, MD;  Location: [facility]VAS[facility] CV [facility];  Service: Cardiovascular;  Laterality: N/A;   TRANSESOPHAGEAL ECHOCARDIOGRAM (CATH LAB) N/A 02/28/2023   Procedure: TRANSESOPHAGEAL ECHOCARDIOGRAM;  Surgeon: Lewayne Bunting, MD;  Location: [facility] INVASIVE CV LAB;  Service: Cardiovascular;  Laterality: N/A;   TRANSESOPHAGEAL ECHOCARDIOGRAM (CATH LAB) N/A 04/19/2023  Procedure: TRANSESOPHAGEAL ECHOCARDIOGRAM;  Surgeon: Dorthula Nettles, DO;  Location: [facility] INVASIVE CV [facility];  Service: Cardiovascular;  Laterality: N/A;     reports that he has quit smoking. He has never used smokeless tobacco. He reports that he does not currently use alcohol. He reports that he does not use drugs.  Allergies  Allergen Reactions   Ace Inhibitors Cough   Isosorb Dinitrate-Hydralazine Other (See Comments)    dizziness/hypotensive    Family History  Problem Relation Age of Onset   Multiple myeloma Father    Lupus Sister        died at 45   Diabetes Mellitus II Sister        died from  covid-19   Neuropathy Sister    Asthma Daughter    Colon cancer Neg Hx    Esophageal cancer Neg Hx    Stomach cancer Neg Hx    Pancreatic cancer Neg Hx     Prior to Admission medications   Medication Sig Start Date End Date Taking? Authorizing Provider  albuterol (PROVENTIL) (2.5 MG/3ML) 0.083% nebulizer solution TAKE 3 MLS BY NEBULIZER EVERY 6 HOURS AS NEEDED FOR WHEEZING FOR SHORTNESS OF BREATH 02/23/22   [name], NP  amiodarone (PACERONE) 200 MG tablet Take 1 tablet (200 mg total) by mouth daily. 04/28/23   Robbie Lis M, PA-C  fluticasone-salmeterol (ADVAIR) 500-50 MCG/ACT AEPB Inhale 1 puff into the lungs in the morning and at bedtime. Patient taking differently: Inhale 2 puffs into the lungs in the morning and at bedtime. 06/29/22   [name], NP  gabapentin (NEURONTIN) 300 MG capsule Take 1 capsule (300 mg total) by mouth at bedtime. 12/20/22   [name], NP  torsemide (DEMADEX) 20 MG tablet Take 2 tablets (40 mg total) by mouth daily. 04/28/23   Allayne Butcher, PA-C     Physical Exam: Vitals:   05/03/23 1932 05/03/23 1934 05/03/23 1940 05/03/23 2327  BP:   114/84   Pulse:   74   Temp:   98.3 F (36.8 C) 98.3 F (36.8 C)  TempSrc:   Oral Oral  SpO2: 96%  97%   Weight:  78 kg    Height:  5\' 11"  (1.803 m)      Physical Exam Vitals and nursing note reviewed.  Constitutional:      Appearance: He is ill-appearing.  HENT:     Mouth/Throat:     Mouth: Mucous membranes are moist.  Eyes:     Conjunctiva/sclera: Conjunctivae normal.  Cardiovascular:     Rate and Rhythm: Normal rate and regular rhythm.     Pulses: Normal pulses.     Heart sounds: Normal heart sounds.  Pulmonary:     Effort: Pulmonary effort is normal.     Breath sounds: Normal breath sounds.  Abdominal:     General: Bowel sounds are normal. There is no distension.  Musculoskeletal:        General: Swelling present. No tenderness, deformity or signs of injury.      Cervical back: Neck supple.     Right lower leg: Edema present.     Left lower leg: No edema.     Comments: Right upper extremity swelling  Skin:    General: Skin is warm.     Capillary Refill: Capillary refill takes less than 2 seconds.  Neurological:     Mental Status: He is alert and oriented to person, place, and time.  Psychiatric:        Mood and Affect:  Mood normal.      Labs on Admission: I have personally reviewed following labs and imaging studies  CBC: Recent Labs  Lab 05/03/23 2146  WBC 10.1  NEUTROABS 8.8*  HGB 13.7  HCT 42.1  MCV 96.8  PLT 189   Basic Metabolic Panel: Recent Labs  Lab 04/27/23 0226 04/28/23 0252 05/03/23 2146  NA 132* 129* 138  K 4.3 4.6 4.4  CL 93* 93* 100  CO2 26 24 25   GLUCOSE 112* 106* 108*  BUN 85* 88* 59*  CREATININE 3.18* 3.32* 2.65*  CALCIUM 10.0 9.5 9.9   GFR: Estimated Creatinine Clearance: 22.9 mL/min (A) (by C-G formula based on SCr of 2.65 mg/dL (H)). Liver Function Tests: Recent Labs  Lab 05/03/23 2146  AST 23  ALT 31  ALKPHOS 70  BILITOT 1.8*  PROT 6.0*  ALBUMIN 2.6*   No results for input(s): "LIPASE", "AMYLASE" in the last 168 hours. No results for input(s): "AMMONIA" in the last 168 hours. Coagulation Profile: Recent Labs  Lab 05/03/23 2146  INR 2.6*   Cardiac Enzymes: No results for input(s): "CKTOTAL", "CKMB", "CKMBINDEX", "TROPONINI", "TROPONINIHS" in the last 168 hours. BNP (last 3 results) Recent Labs    04/15/23 1627  BNP 2,612.3*   HbA1C: No results for input(s): "HGBA1C" in the last 72 hours. CBG: No results for input(s): "GLUCAP" in the last 168 hours. Lipid Profile: No results for input(s): "CHOL", "HDL", "LDLCALC", "TRIG", "CHOLHDL", "LDLDIRECT" in the last 72 hours. Thyroid Function Tests: No results for input(s): "TSH", "T4TOTAL", "FREET4", "T3FREE", "THYROIDAB" in the last 72 hours. Anemia Panel: No results for input(s): "VITAMINB12", "FOLATE", "FERRITIN", "TIBC",  "IRON", "RETICCTPCT" in the last 72 hours. Urine analysis:    Component Value Date/Time   COLORURINE YELLOW 04/16/2023 0250   APPEARANCEUR CLEAR 04/16/2023 0250   LABSPEC 1.016 04/16/2023 0250   PHURINE 5.0 04/16/2023 0250   GLUCOSEU >=500 (A) 04/16/2023 0250   GLUCOSEU NEGATIVE 01/30/2019 1605   HGBUR NEGATIVE 04/16/2023 0250   BILIRUBINUR NEGATIVE 04/16/2023 0250   KETONESUR NEGATIVE 04/16/2023 0250   PROTEINUR 30 (A) 04/16/2023 0250   UROBILINOGEN 0.2 01/30/2019 1605   NITRITE NEGATIVE 04/16/2023 0250   LEUKOCYTESUR NEGATIVE 04/16/2023 0250    Radiological Exams on Admission: I have personally reviewed images CT CHEST WO CONTRAST Result Date: 05/03/2023 CLINICAL DATA:  Central right upper extremity venous obstruction, thoracic mass EXAM: CT CHEST WITHOUT CONTRAST TECHNIQUE: Multidetector CT imaging of the chest was performed following the standard protocol without IV contrast. RADIATION DOSE REDUCTION: This exam was performed according to the departmental dose-optimization program which includes automated exposure control, adjustment of the mA and/or kV according to patient size and/or use of iterative reconstruction technique. COMPARISON:  04/18/2022 FINDINGS: Cardiovascular: Mild multi-vessel coronary artery calcification. Mild global cardiomegaly with left ventricular enlargement. No pericardial effusion. Central pulmonary arteries are enlarged in keeping with changes of pulmonary arterial hypertension. Mild atherosclerotic calcification within thoracic aorta. There is fusiform dilation of the descending thoracic aorta measuring 3.5 cm in diameter proximally and 3.3 cm in diameter distally at the level the left atrium. The ascending aorta is of normal caliber. The right subclavian vein is diffusely dilated, mildly hyperdense, and demonstrates extensive perivascular inflammatory stranding in keeping with changes of thrombosis and possible superimposed thrombophlebitis. The innominate vein  is of relatively narrow caliber, however, its patency is not well assessed on this noncontrast examination. Mediastinum/Nodes: Heterogeneous enhancement of the left thyroid lobe appears similar to prior examination is nonspecific. A discrete nodule is not clearly  identified. No pathologic thoracic adenopathy. The esophagus is unremarkable. Lungs/Pleura: The lungs are symmetrically hyperinflated in keeping with changes of underlying COPD. Bibasilar parenchymal scarring. Lungs are otherwise clear. No pneumothorax or pleural effusion. Upper Abdomen: No acute abnormality. Musculoskeletal: No chest wall mass or suspicious bone lesions identified. IMPRESSION: 1. Diffusely dilated, mildly hyperdense, and demonstrates extensive perivascular inflammatory stranding in keeping with changes of thrombosis and possible superimposed thrombophlebitis. A central obstructing lesion is not clearly identified. The innominate vein is of relatively narrow caliber, however, its patency is not well assessed on this noncontrast examination. If there is a question of chronic thrombosis of the innominate vein, patency of this vessel would better assessed with contrast enhanced CT venography. 2. Mild multi-vessel coronary artery calcification. Mild global cardiomegaly with left ventricular enlargement. 3. Morphologic changes in keeping with pulmonary arterial hypertension. 4. COPD. Aortic Atherosclerosis (ICD10-I70.0). Electronically Signed   By: Helyn Numbers M.D.   On: 05/03/2023 23:57     EKG: Pending EKG  Assessment/Plan: Principal Problem:   Acute deep vein thrombosis (DVT) of right upper extremity (HCC) Active Problems:   Paroxysmal atrial fibrillation (HCC)   Chronic diastolic CHF (congestive heart failure) (HCC)   CVA (cerebral vascular accident) (HCC)   Essential hypertension   OSA (obstructive sleep apnea)   Cardiomyopathy (HCC)   Hyperglycemia   Neuropathy   Chronic kidney disease (CKD), stage IV (severe) (HCC)    History of COPD   Pulmonary hypertension (HCC)    Assessment and Plan: Acute DVT of right sided subclavian vein of the upper extremity > Patient has been recently discharged from the hospital 05/08/2023.  He was admitted for acute cardiogenic shock as well as diagnosed with atrial fibrillation.  Patient found to have a low ejection fraction 20 to 25%.  Eventually patient was transition to DNR/DNI-comfort care and most of the medication was discontinued except amiodarone, gabapentin, Singulair and inhaler.  Eliquis was discontinued as patient was transition to comfort measure.  However patient had an outpatient right upper extremity ultrasound which showed DVT and he was sent to ED for evaluation - Patient has not been on Eliquis since discharge as he was transition to comfort measure. -Hemodynamically stable - CBC and CMP no acute lab abnormality.  Normal troponin level. - Pending EKG - In the ED vascular surgeon Dr. Randie Heinz has been consulted recommended CT chest and start to treat with IV heparin drip - Given patient has CKDs stage IV noncontrast CT chest obtained which showed right subclavian vein thrombosis and thrombophlebitis.  Will evaluate patient in the daytime to discuss further treatment options. - Given patient patient is DNR/DNI with comfort measure consulted inpatient palliative care to discuss further goal of care with patient. -Continue heparin drip with pharmacy consult.   History of paroxysmal atrial fibrillation -Previously Eliquis and Toprol-XL has been discontinued on discharge as patient was transition to DNR DNI-comfort measures and home hospice.  While he continued amiodarone - Starting amiodarone. - Obtaining EKG - Currently on IV heparin drip for upper extremity DVT.   History of significant cardiomyopathy Chronic systolic diastolic heart reduced EF 20 to 25% -Jardiance, Toprol-XL was discontinued when patient was discharged as he was transition to DNR and DNI with  comfort measure and home hospice.   -Current new torsemide 20 mg daily  History of CVA - Currently at home not on Eliquis and Lipitor anymore.  History of OSA -Continue pulse ox and nocturnal nasal cannula oxygen  Hyperlipidemia Currently not on Lipitor.  Non-insulin-dependent DM  type II -Diet controlled DM type II.  Continue heart healthy carb modified diet and POC blood glucose check.  Peripheral neuropathy -Currently not on gabapentin  CKD stage IV - Creatinine 2.65 and GFR 23.  Elevated BUN 59.  Renal function at baseline.  Monitor renal function, avoid neurotoxic agent renally adjust medications.  History of COPD -Stable O2 sat 100% room air Continued DuoNeb as needed and Dulera twice daily.  DVT prophylaxis:  IV heparin gtts Code Status:  DNR/DNI(Do NOT Intubate) Diet: Dysphagia diet Family Communication: Currently no family member at bedside now. Disposition Plan: Continue IV heparin drip.  Will follow-up with vascular surgery for further treatment options.  Consulted palliative team to discuss further goal of care with patient's family in the daytime. Consults: Vascular show and palliative care Admission status:   Inpatient, Telemetry bed  Severity of Illness: The appropriate patient status for this patient is INPATIENT. Inpatient status is judged to be reasonable and necessary in order to provide the required intensity of service to ensure the patient's safety. The patient's presenting symptoms, physical exam findings, and initial radiographic and laboratory data in the context of their chronic comorbidities is felt to place them at high risk for further clinical deterioration. Furthermore, it is not anticipated that the patient will be medically stable for discharge from the hospital within 2 midnights of admission.   * I certify that at the point of admission it is my clinical judgment that the patient will require inpatient hospital care spanning beyond 2 midnights  from the point of admission due to high intensity of service, high risk for further deterioration and high frequency of surveillance required.Marland Kitchen    Tereasa Coop, MD [facility]  How to contact the [facility] Attending or Consulting provider 7A - 7P or covering provider during after hours 7P -7A, for this patient.  Check the care team in [facility] and look for a) attending/consulting [facility] provider listed and b) the [facility] team listed Log into [email or web address] and use 's universal password to access. If you do not have the password, please contact the hospital operator. Locate the [facility] provider you are looking for under [facility] and page to a number that you can be directly reached. If you still have difficulty reaching the provider, please page the [facility] (Director on Call) for the Hospitalists listed on amion for assistance.  05/04/2023, 2:00 AM

## 2023-05-04 NOTE — ED Notes (Signed)
This RN called and spoke with wife [name], update given, wife greatly appreciated

## 2023-05-04 NOTE — Telephone Encounter (Signed)
Patient's wife is calling stating patient has been in hallway at ED at [facility] since 7:30 P.M. 02/18 and would like to know if Dr. Antoine Poche can assist in helping get patient a bed. States he has a blood clot and would like help in getting out of the hallway.

## 2023-05-04 NOTE — Progress Notes (Signed)
PROGRESS NOTE  Brandon [name]  DOB: 08/29/1940  PCP: Brandon Craze, NP NWG:[id number]  DOA: 05/03/2023  LOS: 0 days  Hospital Day: 2  Brief narrative: Brandon [name] is a 83 y.o. male with PMH significant for HTN, stroke, OSA not on CPAP, systolic CHF, nonischemic cardiomyopathy, paroxysmal A-fib, pulmonary hypertension was recently hospitalized under cardiology service 1/31 to 2/13 for cardiogenic shock secondary to recurrent A-fib.  He was on Lasix drip, milrinone drip, underwent successful DCCV.  He was deemed not a candidate for advanced therapies given his age, frailty and poor kidney function.  Palliative care was consulted for GOC discussion.  He was made DNR and also subsequently transition to comfort care measures.  He was discharged to nursing facility plan for outpatient palliative care follow-up and transition to residential hospice once appropriate.  Some of the medications including Eliquis were discontinued at discharge.  At the SNF, patient had poor appetite, hydration and weakness.  He also had progressive swelling of his right upper arm. 2/18, patient had an ultrasound duplex of upper extremity and noted to have right arm DVT.  Cardiology service was called.  Cardiology suggested to resume Eliquis at a higher dose of 10 mg twice daily for a week then decrease to 5 mg twice daily.  However it seems the facility still wanted the patient to be transferred to the hospital and hence he was brought to ED at [facility].  In the ED, patient was afebrile, hemodynamically stable BUN/creatinine 59/2.65 ED physician has been consulted and spoken with vascular surgeon Dr. Randie Heinz who recommended to obtain CT chest.  CTA chest showed: Diffusely dilated, mildly hyperdense, and demonstrates extensive perivascular inflammatory stranding in keeping with changes of thrombosis and possible superimposed thrombophlebitis. A central obstructing lesion is not clearly identified. The innominate  vein is of relatively narrow caliber, however, its patency is not well assessed on this noncontrast examination. If there is a question of chronic thrombosis of the innominate vein, patency of this vessel would better assessed with contrast enhanced CT venography.  Patient was started on IV heparin drip. Admitted to [facility]  Subjective: Patient was seen and examined this morning. Pleasant elderly African-American male. Propped up in ED bed.  Not in distress. Able to have a conversation.  He states that he is not sure if he wants to remain in hospice care.  Palliative care consulted Chart reviewed. Afebrile, hemodynamically stable  Assessment and plan: Acute DVT of right sided subclavian vein DVT identified in ultrasound duplex obtained as an outpatient for few days of progressive swelling.  Patient was recently in the hospital and had central lines in place used for milrinone drip, Lasix drip. As he was transitioned to comfort care, Eliquis was held at discharge. Now with new DVT, patient has been started on IV heparin drip as recommended by vascular surgery. Vascular surgery to follow-up Palliative care to clarify goals of care.  Chronic systolic CHF Nonischemic cardiomyopathy Recent hospitalized for cardiogenic shock. Recent echo from 2/4 with EF 20 to 25%. Most of meds on GDMT were discontinued at discharge. Currently continued on torsemide 40 mg daily  Paroxysmal A-fib Continue amiodarone Eliquis resumed as discussed above  H/o CVA HLD Eliquis as above.  Lipitor was stopped last admission  Type 2 diabetes mellitus A1c 5.1 on 2/4 Diet-controlled   Peripheral neuropathy Currently not on gabapentin   CKD stage 4 Creatinine at baseline  Recent Labs    04/20/23 0452 04/21/23 0228 04/22/23 5784 04/23/23 0527 04/25/23 0320  04/26/23 0251 04/27/23 0226 04/28/23 0252 05/03/23 2146 05/04/23 0520  BUN 77* 88* 83* 80* 79* 83* 85* 88* 59* 60*  CREATININE 3.19* 3.59*  3.63* 3.36* 3.41* 3.49* 3.18* 3.32* 2.65* 2.80*   COPD Stable O2 sat 100% room air Continued DuoNeb as needed and Dulera twice daily.   Mobility: Encourage ambulation.  Goals of care   Code Status: Limited: Do not attempt resuscitation (DNR) -DNR-LIMITED -Do Not Intubate/DNI      DVT prophylaxis:  SCDs Start: 05/04/23 0128 Place TED hose Start: 05/04/23 0128   Antimicrobials: None Fluid: None Consultants: Palliative care Family Communication: None at bedside  Status: Patient Level of care:  Telemetry Medical   Patient is from: [facility] Needs to continue in-hospital care: Pending clinical course and workup Anticipated d/c to: Pending clinical course.  Patient states he probably would prefer to go back to SNF.  He has a family in [place].    Diet:  Diet Order             DIET DYS 2 Room service appropriate? Yes; Fluid consistency: Thin; Fluid restriction: 2000 mL Fluid  Diet effective now                   Scheduled Meds:  amiodarone  200 mg Oral Daily   gabapentin  300 mg Oral QHS   mometasone-formoterol  2 puff Inhalation BID   pantoprazole  40 mg Oral Daily   sodium chloride flush  3 mL Intravenous Q12H   torsemide  40 mg Oral Daily    PRN meds: sodium chloride, acetaminophen **OR** acetaminophen, ipratropium-albuterol, ondansetron **OR** ondansetron (ZOFRAN) IV, sodium chloride flush   Infusions:   sodium chloride     heparin 1,050 Units/hr (05/04/23 0653)    Antimicrobials: Anti-infectives (From admission, onward)    None       Objective: Vitals:   05/04/23 0938 05/04/23 1129  BP: 122/78   Pulse: 67   Resp: 15   Temp:  97.6 F (36.4 C)  SpO2: 95%     Intake/Output Summary (Last 24 hours) at 05/04/2023 1326 Last data filed at 05/04/2023 0653 Gross per 24 hour  Intake 129.45 ml  Output --  Net 129.45 ml   Filed Weights   05/03/23 1934  Weight: 78 kg   Weight change:  Body mass index is 23.98 kg/m.    Physical Exam: General exam: Pleasant, elderly African-American male Skin: No rashes, lesions or ulcers. HEENT: Atraumatic, normocephalic, no obvious bleeding Lungs: Clear to auscultation bilaterally,  CVS: S1, S2, no murmur,   GI/Abd: Soft, nontender, nondistended, bowel sound present,   CNS: Alert, awake, oriented to place and person Psychiatry: Mood appropriate,  Extremities: No pedal edema, no calf tenderness,   Data Review: I have personally reviewed the laboratory data and studies available.  F/u labs  Unresulted Labs (From admission, onward)     Start     Ordered   05/05/23 0500  Heparin level (unfractionated)  Daily,   R      05/03/23 2155   05/04/23 1600  Heparin level (unfractionated)  Once-Timed,   TIMED        05/04/23 0653   05/04/23 1600  APTT  Once-Timed,   TIMED        05/04/23 0829   05/04/23 0500  CBC  Daily,   R      05/03/23 2155            Total time spent in review of  labs and imaging, patient evaluation, formulation of plan, documentation and communication with family: 55 minutes  Signed, Lorin Glass, MD [facility] 05/04/2023

## 2023-05-04 NOTE — ED Notes (Signed)
Patient resting in bed, no s/s of distress, will continue to monitor.  

## 2023-05-04 NOTE — Consult Note (Signed)
ED Consult    Reason for Consult:  Right arm dvt Referring Physician:  Dr. Pola Corn MRN #:  [id number]  History of Present Illness: This is a 83 y.o. male without medical history of DVT was found to have a DVT yesterday and was sent for further evaluation in the emergency department.  He states swelling is been present for about 7 days.  He was initially on Eliquis but reportedly this was held for palliative reasons.  Past Medical History:  Diagnosis Date   Anemia    Arm DVT (deep venous thromboembolism), acute, right (HCC)    Asthma    Hx of childhood asthma, disappeared for a while, then resurfaced 6-7 years ago.    Cardiomyopathy    with a negative cardiac catheterization in the past. 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     Past Surgical History:  Procedure Laterality Date   CARDIOVERSION N/A 02/28/2023   Procedure: CARDIOVERSION;  Surgeon: Lewayne Bunting, MD;  Location: [facility] INVASIVE CV LAB;  Service: Cardiovascular;  Laterality: N/A;   CARDIOVERSION N/A 04/19/2023   Procedure: CARDIOVERSION;  Surgeon: Dorthula Nettles, DO;  Location: [facility] INVASIVE CV LAB;  Service: Cardiovascular;  Laterality: N/A;   None     RIGHT HEART CATH N/A 01/13/2023   Procedure: RIGHT HEART CATH;  Surgeon: Dorthula Nettles, DO;  Location: [facility] INVASIVE CV LAB;  Service: Cardiovascular;  Laterality: N/A;   TRANSESOPHAGEAL ECHOCARDIOGRAM (CATH LAB) N/A 01/14/2023   Procedure: TRANSESOPHAGEAL ECHOCARDIOGRAM;  Surgeon: Thurmon Fair, MD;  Location: [facility] INVASIVE CV LAB;  Service: Cardiovascular;  Laterality: N/A;   TRANSESOPHAGEAL ECHOCARDIOGRAM (CATH LAB) N/A 02/28/2023   Procedure: TRANSESOPHAGEAL ECHOCARDIOGRAM;  Surgeon: Lewayne Bunting, MD;  Location: [facility] INVASIVE CV LAB;  Service: Cardiovascular;   Laterality: N/A;   TRANSESOPHAGEAL ECHOCARDIOGRAM (CATH LAB) N/A 04/19/2023   Procedure: TRANSESOPHAGEAL ECHOCARDIOGRAM;  Surgeon: Dorthula Nettles, DO;  Location: [facility] INVASIVE CV LAB;  Service: Cardiovascular;  Laterality: N/A;    Allergies  Allergen Reactions   Ace Inhibitors Cough   Isosorb Dinitrate-Hydralazine Other (See Comments)    dizziness/hypotensive    Prior to Admission medications   Medication Sig Start Date End Date Taking? Authorizing Provider  acetaminophen (TYLENOL) 500 MG tablet Take 500 mg by mouth in the morning, at noon, and at bedtime.   Yes [provider]  ALPRAZolam (XANAX) 0.25 MG tablet Take 0.25 mg by mouth 3 (three) times daily as needed for anxiety (for 14 days).   Yes [provider]  amiodarone (PACERONE) 200 MG tablet Take 1 tablet (200 mg total) by mouth daily. 04/28/23  Yes Robbie Lis M, PA-C  citalopram (CELEXA) 10 MG tablet Take 10 mg by mouth daily.   Yes [provider]  fluticasone-salmeterol (ADVAIR) 500-50 MCG/ACT AEPB Inhale 1 puff into the lungs in the morning and at bedtime. 06/29/22  Yes [name], NP  gabapentin (NEURONTIN) 300 MG capsule Take 1 capsule (300 mg total) by mouth at bedtime. 12/20/22  Yes [name], NP  torsemide (DEMADEX) 20 MG tablet Take 2 tablets (40 mg total) by mouth daily. 04/28/23  Yes Simmons, Brittainy M, PA-C  albuterol (PROVENTIL) (2.5 MG/3ML) 0.083% nebulizer solution TAKE 3 MLS BY NEBULIZER EVERY 6 HOURS AS NEEDED FOR WHEEZING FOR  SHORTNESS OF BREATH Patient not taking: Reported on 05/04/2023 02/23/22   [name], NP    Social History   Socioeconomic History   Marital status: Married    Spouse name: [name]   Number of children: 3   Years of education: Not on file   Highest education level: Bachelor's degree (e.g., BA, AB, BS)  Occupational History   Occupation: retired  Tobacco Use   Smoking status: Former   Smokeless tobacco: Never   Tobacco  comments:    quit smoling in May 27, 1988. started when 18, 1 ppd  Vaping Use   Vaping status: Never Used  Substance and Sexual Activity   Alcohol use: Not Currently    Comment: occ   Drug use: Never   Sexual activity: Not Currently  Other Topics Concern   Not on file  Social History Narrative   Grew up in [place], attended [employer]. First wife died of breast ca in May 27, 1997. 3 children. Remarried- 8 years. 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    Pt signed designated party release granting access to [facility] to his wife [name]. Detailed message may be left on home or cell phone. Roselle Locus August 13, 2009 11:34 am.    Social Drivers of Health   Financial Resource Strain: Low Risk  (01/13/2023)   Overall Financial Resource Strain (CARDIA)    Difficulty of Paying Living Expenses: Not hard at all  Food Insecurity: No Food Insecurity (04/16/2023)   Hunger Vital Sign    Worried About Running Out of Food in the Last Year: Never true    Ran Out of Food in the Last Year: Never true  Transportation Needs: No Transportation Needs (04/16/2023)   PRAPARE - Administrator, Civil Service (Medical): No    Lack of Transportation (Non-Medical): No  Physical Activity: Insufficiently Active (07/25/2020)   Exercise Vital Sign    Days of Exercise per Week: 5 days    Minutes of Exercise per Session: 10 min  Stress: No Stress Concern Present (05/28/2021)   [employer] of Occupational Health - Occupational Stress Questionnaire    Feeling of Stress : Not at all  Social Connections: Socially Integrated (04/16/2023)   Social Connection and Isolation Panel [NHANES]    Frequency of Communication with Friends and Family: More than three times a week    Frequency of Social Gatherings with Friends and Family: Three times a week    Attends Religious Services: More than 4 times per year    Active Member of Clubs or Organizations: Yes    Attends Banker Meetings: More  than 4 times per year    Marital Status: Married  Catering manager Violence: Not At Risk (04/16/2023)   Humiliation, Afraid, Rape, and Kick questionnaire    Fear of Current or Ex-Partner: No    Emotionally Abused: No    Physically Abused: No    Sexually Abused: No     Family History  Problem Relation Age of Onset   Multiple myeloma Father    Lupus Sister        died at 82   Diabetes Mellitus II Sister        died from covid-19   Neuropathy Sister    Asthma Daughter    Colon cancer Neg Hx    Esophageal cancer Neg Hx    Stomach cancer Neg Hx    Pancreatic cancer Neg Hx     Review of Systems  Constitutional: Negative.  HENT: Negative.    Eyes: Negative.   Respiratory: Negative.    Cardiovascular: Negative.   Gastrointestinal: Negative.   Musculoskeletal: Negative.        Right arm swelling  Skin: Negative.   Neurological: Negative.   Endo/Heme/Allergies: Negative.   Psychiatric/Behavioral: Negative.        Physical Examination  Vitals:   05/04/23 0428 05/04/23 0625  BP: 104/73 99/69  Pulse: 67 68  Resp: 16 16  Temp:    SpO2: 95% 95%   Body mass index is 23.98 kg/m.  Physical Exam HENT:     Head: Normocephalic.     Nose: Nose normal.  Eyes:     Pupils: Pupils are equal, round, and reactive to light.  Cardiovascular:     Rate and Rhythm: Normal rate.     Pulses:          Radial pulses are 2+ on the right side and 2+ on the left side.  Pulmonary:     Effort: Pulmonary effort is normal.  Abdominal:     General: Abdomen is flat.  Musculoskeletal:     Cervical back: Normal range of motion.     Comments: RUE edema, hand is spared  Neurological:     General: No focal deficit present.     Mental Status: He is alert.  Psychiatric:        Mood and Affect: Mood normal.        Behavior: Behavior normal.      CBC    Component Value Date/Time   WBC 8.7 05/04/2023 0520   RBC 3.97 (L) 05/04/2023 0520   HGB 12.5 (L) 05/04/2023 0520   HGB 12.7 (L)  04/24/2019 1343   HCT 37.5 (L) 05/04/2023 0520   PLT 198 05/04/2023 0520   PLT 285 04/24/2019 1343   MCV 94.5 05/04/2023 0520   MCH 31.5 05/04/2023 0520   MCHC 33.3 05/04/2023 0520   RDW 15.1 05/04/2023 0520   LYMPHSABS 0.5 (L) 05/03/2023 2146   MONOABS 0.7 05/03/2023 2146   EOSABS 0.0 05/03/2023 2146   BASOSABS 0.0 05/03/2023 2146    BMET    Component Value Date/Time   NA 135 05/04/2023 0520   NA 143 01/21/2023 1720   K 4.1 05/04/2023 0520   CL 102 05/04/2023 0520   CO2 22 05/04/2023 0520   GLUCOSE 107 (H) 05/04/2023 0520   BUN 60 (H) 05/04/2023 0520   BUN 22 01/21/2023 1720   CREATININE 2.80 (H) 05/04/2023 0520   CREATININE 1.55 (H) 04/24/2019 1343   CREATININE 1.40 (H) 04/12/2019 1317   CALCIUM 9.8 05/04/2023 0520   GFRNONAA 22 (L) 05/04/2023 0520   GFRNONAA 42 (L) 04/24/2019 1343   GFRNONAA 48 (L) 04/12/2019 1317   GFRAA 44 (L) 11/02/2019 1501   GFRAA 49 (L) 04/24/2019 1343   GFRAA 55 (L) 04/12/2019 1317    COAGS: Lab Results  Component Value Date   INR 2.6 (H) 05/03/2023   INR 1.8 (H) 04/16/2023     Non-Invasive Vascular Imaging:   CT IMPRESSION: 1. Diffusely dilated, mildly hyperdense, and demonstrates extensive perivascular inflammatory stranding in keeping with changes of thrombosis and possible superimposed thrombophlebitis. A central obstructing lesion is not clearly identified. The innominate vein is of relatively narrow caliber, however, its patency is not well assessed on this noncontrast examination. If there is a question of chronic thrombosis of the innominate vein, patency of this vessel would better assessed with contrast enhanced CT venography. 2. Mild multi-vessel coronary artery calcification.  Mild global cardiomegaly with left ventricular enlargement. 3. Morphologic changes in keeping with pulmonary arterial hypertension. 4. COPD.  ASSESSMENT/PLAN: This is a 83 y.o. male with reports of right upper extremity DVT and swelling.  Would  recommend anticoagulation as patient was not on Eliquis apparently for palliative reasons.  Elevation and gentle compression would also be helpful.  He does not need any vascular invention and can follow-up on an as-needed basis.  Layann Bluett C. Randie Heinz, MD [facility] Office: [phone] Pager: [phone]

## 2023-05-04 NOTE — Progress Notes (Signed)
PHARMACY - ANTICOAGULATION CONSULT NOTE  Pharmacy Consult for heparin infusion Indication:  DVT right upper extremity  Allergies  Allergen Reactions   Ace Inhibitors Cough   Isosorb Dinitrate-Hydralazine Other (See Comments)    dizziness/hypotensive    Patient Measurements: Height: 5\' 11"  (180.3 cm) Weight: 78 kg (171 lb 15.3 oz) IBW/kg (Calculated) : 75.3 Heparin Dosing Weight: 78 kg  Vital Signs: Temp: 98.7 F (37.1 C) (02/19 1640) Temp Source: Oral (02/19 1640) BP: 108/79 (02/19 1640) Pulse Rate: 66 (02/19 1640)  Labs: Recent Labs    05/03/23 2146 05/04/23 0520 05/04/23 1706  HGB 13.7 12.5*  --   HCT 42.1 37.5*  --   PLT 189 198  --   APTT 45*  --  66*  LABPROT 28.0*  --   --   INR 2.6*  --   --   HEPARINUNFRC  --  >1.10* >1.10*  CREATININE 2.65* 2.80*  --     Estimated Creatinine Clearance: 21.7 mL/min (A) (by C-G formula based on SCr of 2.8 mg/dL (H)).   Medical History: Past Medical History:  Diagnosis Date   Anemia    Arm DVT (deep venous thromboembolism), acute, right (HCC)    Asthma    Hx of childhood asthma, disappeared for a while, then resurfaced 6-7 years ago.    Cardiomyopathy    with a negative cardiac catheterization in the past. 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     Assessment: 83 yo M presenting with concern for DVT in his right arm. 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 Last dose of Eliquis was at previous hospital admission on 04/23/23. Notably discharge summary on 2/17 mentioned discontinuing Eliquis therapy for comfort measures. Pharmacy consulted for heparin management.  Heparin level >1.10, supra-therapeutic, but this is likely due to recent Eliquis given lack of correlation  with aPTT. aPTT therapeutic at 66.   Goal of Therapy:  Heparin level 0.3-0.7 units/ml aPTT 66-102 Monitor platelets by anticoagulation protocol: Yes   Plan:  Heparin to 1100 units/ hr  Check 8 hour heparin level (confirmatory level in AM)  Monitor daily heparin level / aPTT, CBC, and signs/symptoms of bleeding F/u GOC discussion and plans for anticoagulation  Cedric Fishman, PharmD, BCPS, BCCCP Clinical Pharmacist

## 2023-05-04 NOTE — ED Notes (Signed)
 Patient left the floor in stable condition, with his belongings and staff.

## 2023-05-04 NOTE — Telephone Encounter (Signed)
Patient identification verified by 2 forms. Marilynn Rail, RN     Called and spoke to patients wife Dorthy Cooler states:   -patient is in the ED awaiting for a room  -patient has been in ED since yesterday at 7:30pm  Informed [name] RN/office unable to assist with getting sooner bed  [name] verbalized understanding, no questions at this time

## 2023-05-04 NOTE — ED Notes (Signed)
Heparin infusion paused so that labs could be obtained from pt's left arm, right arm is restricted.

## 2023-05-04 NOTE — Progress Notes (Signed)
PHARMACY - ANTICOAGULATION CONSULT NOTE  Pharmacy Consult for heparin infusion Indication:  DVT right upper extremity  Allergies  Allergen Reactions   Ace Inhibitors Cough   Isosorb Dinitrate-Hydralazine Other (See Comments)    dizziness/hypotensive    Patient Measurements: Height: 5\' 11"  (180.3 cm) Weight: 78 kg (171 lb 15.3 oz) IBW/kg (Calculated) : 75.3 Heparin Dosing Weight: 78 kg  Vital Signs: Temp: 97.2 F (36.2 C) (02/19 0348) Temp Source: Temporal (02/19 0348) BP: 99/69 (02/19 0625) Pulse Rate: 68 (02/19 0625)  Labs: Recent Labs    05/03/23 2146 05/04/23 0520  HGB 13.7 12.5*  HCT 42.1 37.5*  PLT 189 198  APTT 45*  --   LABPROT 28.0*  --   INR 2.6*  --   HEPARINUNFRC  --  >1.10*  CREATININE 2.65* 2.80*    Estimated Creatinine Clearance: 21.7 mL/min (A) (by C-G formula based on SCr of 2.8 mg/dL (H)).   Medical History: Past Medical History:  Diagnosis Date   Anemia    Arm DVT (deep venous thromboembolism), acute, right (HCC)    Asthma    Hx of childhood asthma, disappeared for a while, then resurfaced 6-7 years ago.    Cardiomyopathy    with a negative cardiac catheterization in the past. 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     Assessment: 83 yo M presenting with concern for DVT in his right arm. 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 Last dose of Eliquis was at previous hospital admission on 04/23/23. Notably discharge summary on 2/17 mentioned discontinuing Eliquis therapy for comfort measures. Pharmacy consulted for heparin management.  Heparin level >1.10, supra-therapeutic  Heparin infusion running at 1200 units/hr. Heparin level drawn appropriately. No issues with infusion or bleeding.    Goal of Therapy:  Heparin level 0.3-0.7 units/ml Monitor platelets by anticoagulation protocol: Yes   Plan:  Hold heparin infusion x 1 hour Decrease heparin infusion at 1050 units/hr Check 8 hour heparin level Monitor daily heparin level, CBC, and signs/symptoms of bleeding F/u GOC discussion and plans for anticoagulation  Thank you for allowing pharmacy to participate in this patient's care.  Marja Kays, PharmD Emergency Medicine Clinical Pharmacist 05/04/2023,6:32 AM

## 2023-05-04 NOTE — ED Notes (Signed)
Attempted to call wife for update, unable to reach wife [name], left VM

## 2023-05-04 NOTE — ED Notes (Signed)
Pink limb alert band placed on right arm Yellow fall risk band place on right arm

## 2023-05-05 DIAGNOSIS — Z66 Do not resuscitate: Secondary | ICD-10-CM | POA: Diagnosis not present

## 2023-05-05 DIAGNOSIS — Z515 Encounter for palliative care: Secondary | ICD-10-CM | POA: Diagnosis not present

## 2023-05-05 DIAGNOSIS — G629 Polyneuropathy, unspecified: Secondary | ICD-10-CM

## 2023-05-05 DIAGNOSIS — I82621 Acute embolism and thrombosis of deep veins of right upper extremity: Secondary | ICD-10-CM | POA: Diagnosis not present

## 2023-05-05 DIAGNOSIS — Z7189 Other specified counseling: Secondary | ICD-10-CM

## 2023-05-05 LAB — GLUCOSE, CAPILLARY
Glucose-Capillary: 83 mg/dL (ref 70–99)
Glucose-Capillary: 94 mg/dL (ref 70–99)
Glucose-Capillary: 115 mg/dL — ABNORMAL HIGH (ref 70–99)
Glucose-Capillary: 104 mg/dL — ABNORMAL HIGH (ref 70–99)

## 2023-05-05 MED ORDER — APIXABAN 5 MG PO TABS
10.0000 mg | ORAL_TABLET | Freq: Two times a day (BID) | ORAL | Status: DC
  Administered 2023-05-05 – 2023-05-09 (×9): 10 mg via ORAL
  Filled 2023-05-05 (×10): qty 2

## 2023-05-05 MED ORDER — ORAL CARE MOUTH RINSE: 15.0000 mL | OROMUCOSAL | Status: DC | PRN

## 2023-05-05 MED ORDER — APIXABAN 5 MG PO TABS: 5.0000 mg | ORAL_TABLET | Freq: Two times a day (BID) | ORAL | Status: DC

## 2023-05-05 NOTE — Consult Note (Signed)
[facility] Liaison Consult Note    05/05/2023  Brandon [name] 1940/06/12 [id number]  Insurance:   Primary Care Provider: [name], NP  this provider is listed for the transition of care follow up appointments  and ***calls   [facility] Liaison met patient at bedside at [facility].     The patient was screened for [facility] Liaison 7 and 30 day Readmission:[id number]} day readmission hospitalization with noted [facility] Liaison Readmission Risk Score:[id number]} risk score for unplanned readmission risk ***hospital admissions in 6 months.  The patient was assessed for potential Community Care Coordination service needs for post hospital transition for care coordination. Review of patient's electronic medical record reveals patient is ***.   Plan: [facility] Liaison will continue to follow progress and disposition to asess for post hospital community care coordination/management needs.  Referral request for community care coordination: ***   VBCI Community Care, Population Health does not replace or interfere with any arrangements made by the Inpatient Transition of Care team.   For questions contact:   Charlesetta Shanks, RN, BSN, CCM [place]  [facility], [facility] Liaison Direct Dial: [phone] or secure chat Email: [email or web address]

## 2023-05-05 NOTE — Plan of Care (Signed)
  Problem: Health Behavior/Discharge Planning: Goal: Ability to manage health-related needs will improve Outcome: Progressing   Problem: Nutrition: Goal: Adequate nutrition will be maintained Outcome: Progressing   Problem: Coping: Goal: Level of anxiety will decrease Outcome: Progressing   Problem: Pain Managment: Goal: General experience of comfort will improve and/or be controlled Outcome: Progressing

## 2023-05-05 NOTE — Consult Note (Signed)
Palliative Care Consult Note                                  Date: 05/05/2023   Patient Name: Brandon [name]  DOB: [birth year]  MRN: [id number]  Age / Sex: 83 y.o., male  PCP: [name], NP Referring Physician: Lorin Glass, MD  Reason for Consultation: Establishing goals of care  HPI/Patient Profile: 83 y.o. male  with past medical history of HTN, stroke, OSA not on CPAP, systolic CHF, nonischemic cardiomyopathy, paroxysmal A-fib, pulmonary hypertension was recently hospitalized under cardiology service 1/31 to 2/13 for cardiogenic shock secondary to recurrent A-fib. He was on Lasix drip, milrinone drip, underwent successful DCCV. He was deemed not a candidate for advanced therapies given his age, frailty and poor kidney function. Palliative care was consulted for GOC discussion. He was made DNR and also subsequently transition to comfort care measures. He was discharged to nursing facility plan for outpatient palliative care follow-up and transition to residential hospice once appropriate. He presented from SNF/Rehab with RUE DVT and was admitted on 05/03/2023 with acute DVT of right sided subclavian vein, chronic systolic CHF, and ICM, CKD 4, and others.   Palliative medicine was consulted for GOC conversations.  Past Medical History:  Diagnosis Date   Anemia    Arm DVT (deep venous thromboembolism), acute, right (HCC)    Asthma    Hx of childhood asthma, disappeared for a while, then resurfaced 6-7 years ago.    Cardiomyopathy    with a negative cardiac catheterization in the past. 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     Subjective:   This NP Wynne Dust reviewed medical records, received report from team, assessed  the patient and then meet at the patient's bedside to discuss diagnosis, prognosis, GOC, EOL wishes disposition and options.  I met with the patient at bedside, no family was present.   We meet to discuss diagnosis prognosis, GOC, EOL wishes, disposition and options. Concept of Palliative Care was introduced as specialized medical care for people and their families living with serious illness.  If focuses on providing relief from the symptoms and stress of a serious illness.  The goal is to improve quality of life for both the patient and the family. Values and goals of care important to patient and family were attempted to be elicited.  Created space and opportunity for patient  and family to explore thoughts and feelings regarding current medical situation   Natural trajectory and current clinical status were discussed. Questions and concerns addressed. Patient  encouraged to call with questions or concerns.    Patient/Family Understanding of Illness: He understands he has a weak heart and is at high risk for decline at any point.  He knows he is here for a blood clot in his right arm which is causing swelling.  He is thankful to be off IV heparin which requires frequent blood draws and on an oral anticoagulant which does not.  We spent some time reviewing his chronic situation and acute presentation.  Life Review: Copied from previous note: "The patient is currently married, which is a second marriage, and has been married for  22 years.  He has an older daughter Brandon [name] who lives in [place], younger daughter Brandon [name] who lives in [place], and a son Brandon [name] who lives in [place].  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  He was born in [place] but lived in [place] in [place] before returning to [place] about 20 years ago.  He was an outgoing individual, played sports in college and played basketball overseas.  He went to college at  [employer] in [place], [place] where he majoring sociology and psychology.  He is a religious/spiritual individual and practices in the Protestant faith.  I offered spiritual care support anytime that he is here."  Patient Values: Quality of life, functional independence, being at home with family  Goals: Treat DVT discharge from the hospital, go back to SNF/rehab to get stronger and eventually back home.  Live his life to the fullest until God decides to call him home.  When it is time to transition to the next life he wants to be comfortable.  Today's Discussion: In addition to discussions described above we had extensive discussion very topics.  We talked about rehab and that his to started but thus far he is hopeful and it seems like it will do well.  His breathing and cardiac problems have been okay since leaving the hospital.  His appetite has been poor at the SNF and he tried to eat the day of transfer to the hospital, but it made him nauseous.  Since then he has not had any nausea including eating here.  We reviewed his chronic heart which is very weak and high risk for decline.  However, he shares that he does not want to be "looking down and thinking about death" but when he spoke to God, God told him to "look up and he taught me how to live, not think about dying."  He is wanting to maximize his quality of life and try to get home and live his best life is possible.  He understands that at some point daughter will call him home and he is okay with that.  We discussed outpatient palliative medicine to keep an eye on him so that when this does happen they would be available to provide appropriate care, including hospice care, at that time.  Until then I encouraged him to live his best life.  He is in agreement with this.  I offered to reach out and call his daughter which he agreed.  I did call and speak with the patient's daughter Brandon [name].  We reviewed the course of his rehab stay which  she states is going really well, he was actually doing better than anticipated.  She was in agreement with transfer to the hospital because he was not on a blood thinner at home.  We discussed and she confirms that she would like him to remain on a blood thinner because of high risk clots.  I stated that currently he is on blood thinner and the plan is for that to be continued at discharge.  We discussed continuing appropriate medical interventions with palliative care support with the anticipation that he will eventually decline and need hospice care.  I shared that palliative medicine would back off his goals are clear and plan seems to be in place.  I anticipate a short stay.  I encouraged her to call with any questions or concerns.  I provided emotional and general support through therapeutic listening,  empathy, sharing of stories, and other techniques. I answered all questions and addressed all concerns to the best of my ability.  Review of Systems  Constitutional:        Denies pain in general  Respiratory:  Negative for cough, chest tightness and shortness of breath.   Cardiovascular:  Negative for chest pain.  Gastrointestinal:  Negative for abdominal pain, nausea and vomiting.    Objective:   Primary Diagnoses: Present on Admission:  Essential hypertension  Hyperglycemia  CVA (cerebral vascular accident) (HCC)  OSA (obstructive sleep apnea)   Physical Exam Vitals and nursing note reviewed.  Constitutional:      General: He is sleeping. He is not in acute distress. HENT:     Head: Normocephalic and atraumatic.  Cardiovascular:     Rate and Rhythm: Normal rate.  Pulmonary:     Effort: Pulmonary effort is normal. No respiratory distress.     Breath sounds: No wheezing or rhonchi.  Abdominal:     General: Abdomen is flat. Bowel sounds are normal. There is no distension.     Palpations: Abdomen is soft.  Skin:    General: Skin is warm and dry.  Neurological:     General:  No focal deficit present.     Mental Status: He is oriented to person, place, and time and easily aroused.  Psychiatric:        Mood and Affect: Mood normal.        Behavior: Behavior normal.     Vital Signs:  BP 100/87 (BP Location: Left Arm)   Pulse 68   Temp 97.6 F (36.4 C) (Oral)   Resp 17   Ht 5\' 11"  (1.803 m)   Wt 78 kg   SpO2 100%   BMI 23.98 kg/m   Palliative Assessment/Data: 70%    Advanced Care Planning:   Existing Vynca/ACP Documentation: Advance directive signed 04/21/2023 DNR effective 04/24/2023 GOC document signed 05/05/2023  Primary Decision Maker: PATIENT  Code Status/Advance Care Planning: DNR-limited  A discussion was had today regarding advanced directives. Concepts specific to code status, artifical feeding and hydration, continued IV antibiotics and rehospitalization was had.  The difference between a aggressive medical intervention path and a palliative comfort care path for this patient at this time was had.   Decisions/Changes to ACP: None today  Assessment & Plan:   Impression: 82 year old male with acute presentation chronic comorbidities as described above.  The patient was brought back from SNF/rehab for DVT for which she has been restarted on Eliquis.  At this point her goal is to stabilize from medical standpoint, return to SNF/rehab with a goal of strengthening to be able to discharge home with a functional life.  He wants to enjoy high quality of life for however much time until he decompensates and requires hospice care.  We have already arranged outpatient palliative care to plan for this.  Overall long-term prognosis poor.  SUMMARY OF RECOMMENDATIONS   DNR-limited Continue current scope of care otherwise Would like to remain on blood thinner at discharge to prevent further clots Anticipate discharge back to SNF/rehab in 24 to 48 hours Continue outpatient palliative care as previously ordered at last hospital admission Palliative  medicine will back off for now Please notify us of significant clinical change or new palliative needs  Symptom Management:  Per primary team PMT is available to assist as needed  Prognosis:  Unable to determine  Discharge Planning:  Skilled Nursing Facility for rehab with Palliative care service follow-up  Discussed with: Patient, family, medical team, nursing team    Thank you for allowing [place] to participate in the care of Fontaine E [name] PMT will continue to support holistically.  Time Total: 63 min  Detailed review of medical records (labs, imaging, vital signs), medically appropriate exam, discussed with treatment team, counseling and education to patient, family, & staff, documenting clinical information, medication management, coordination of care  Signed by: Wynne Dust, NP Palliative Medicine Team  Team Phone # [phone] (Nights/Weekends)  05/05/2023, 11:53 AM

## 2023-05-05 NOTE — Progress Notes (Signed)
PROGRESS NOTE  Brandon [name]  DOB: Oct 27, 1940  PCP: [name], NP VWU:[id number]  DOA: 05/03/2023  LOS: 1 day  Hospital Day: 3  Brief narrative: Brandon [name] is a 83 y.o. male with PMH significant for HTN, stroke, OSA not on CPAP, systolic CHF, nonischemic cardiomyopathy, paroxysmal A-fib, pulmonary hypertension was recently hospitalized under cardiology service 1/31 to 2/13 for cardiogenic shock secondary to recurrent A-fib.  He was on Lasix drip, milrinone drip, underwent successful DCCV.  He was deemed not a candidate for advanced therapies given his age, frailty and poor kidney function.  Palliative care was consulted for GOC discussion.  He was made DNR and also subsequently transition to comfort care measures.  He was discharged to nursing facility plan for outpatient palliative care follow-up and transition to residential hospice once appropriate.  Some of the medications including Eliquis were discontinued at discharge.  At the SNF, patient had poor appetite, hydration and weakness.  He also had progressive swelling of his right upper arm. 2/18, patient had an ultrasound duplex of upper extremity and noted to have right arm DVT.  Cardiology service was called.  Cardiology suggested to resume Eliquis at a higher dose of 10 mg twice daily for a week then decrease to 5 mg twice daily.  However it seems the facility still wanted the patient to be transferred to the hospital and hence he was brought to ED at [facility].  In the ED, patient was afebrile, hemodynamically stable BUN/creatinine 59/2.65 ED physician has been consulted and spoken with vascular surgeon Dr. Randie Heinz who recommended to obtain CT chest.  CTA chest showed diffusely dilated, mildly hyperdense, and demonstrates extensive perivascular inflammatory stranding in keeping with changes of thrombosis and possible superimposed thrombophlebitis. A central obstructing lesion is not clearly identified. The innominate vein  is of relatively narrow caliber, however, its patency is not well assessed on this noncontrast examination.  Patient was started on IV heparin drip. Admitted to [facility] Vascular surgery was consulted  Subjective: Patient was seen and examined this morning. Pleasant elderly African-American male. Propped up in bed.  Not in distress. No family at bedside. Further care pending. He states that he is not sure if he wants to remain in hospice care.  Palliative care consulted  Assessment and plan: Acute DVT of right sided subclavian vein DVT identified in ultrasound duplex obtained as an outpatient for few days of progressive swelling.  Patient was recently in the hospital and had central lines in place used for milrinone drip, Lasix drip. As he was transitioned to comfort care, Eliquis was held at discharge. Now with new DVT, patient has been started on IV heparin drip as recommended by vascular surgery. Vascular surgery consulted and recommended no intervention. Switch back from heparin drip to Eliquis today. Recommended to keep right upper extremity elevated. Palliative care to clarify goals of care.  Chronic systolic CHF Nonischemic cardiomyopathy Recent hospitalized for cardiogenic shock. Recent echo from 2/4 with EF 20 to 25%. Most of meds on GDMT were discontinued at discharge. Currently continued on torsemide 40 mg daily Hemodynamically stable.  Renal function improving. I will continue the same plan today.  Paroxysmal A-fib Continue amiodarone Eliquis as above.    H/o CVA HLD Eliquis as above.  Lipitor was stopped last admission  Type 2 diabetes mellitus A1c 5.1 on 2/4 Diet-controlled   Peripheral neuropathy Currently not on gabapentin   CKD stage 4 Creatinine at baseline  Recent Labs    04/20/23 0452 04/21/23 0228  04/22/23 6578 04/23/23 0527 04/25/23 0320 04/26/23 0251 04/27/23 0226 04/28/23 0252 05/03/23 2146 05/04/23 0520  BUN 77* 88* 83* 80* 79* 83* 85*  88* 59* 60*  CREATININE 3.19* 3.59* 3.63* 3.36* 3.41* 3.49* 3.18* 3.32* 2.65* 2.80*   COPD Stable O2 sat 100% room air Continued DuoNeb as needed and Dulera twice daily.   Mobility: Encourage ambulation.  Goals of care   Code Status: Limited: Do not attempt resuscitation (DNR) -DNR-LIMITED -Do Not Intubate/DNI      DVT prophylaxis:  SCDs Start: 05/04/23 0128 Place TED hose Start: 05/04/23 0128 apixaban (ELIQUIS) tablet 10 mg  apixaban (ELIQUIS) tablet 5 mg   Antimicrobials: None Fluid: None Consultants: Palliative care Family Communication: None at bedside  Status: Patient Level of care:  Telemetry Medical   Patient is from: [facility] SNF Needs to continue in-hospital care: Pending palliative care consultation. Anticipated d/c to: Plan to discharge to [facility].  Insurance preauthorization requested by Child psychotherapist today.    Diet:  Diet Order             DIET DYS 2 Room service appropriate? Yes; Fluid consistency: Thin; Fluid restriction: 2000 mL Fluid  Diet effective now                   Scheduled Meds:  amiodarone  200 mg Oral Daily   apixaban  10 mg Oral BID   Followed by   Melene Muller ON 05/12/2023] apixaban  5 mg Oral BID   gabapentin  300 mg Oral QHS   mometasone-formoterol  2 puff Inhalation BID   pantoprazole  40 mg Oral Daily   sodium chloride flush  3 mL Intravenous Q12H   torsemide  40 mg Oral Daily    PRN meds: acetaminophen **OR** acetaminophen, ipratropium-albuterol, ondansetron **OR** ondansetron (ZOFRAN) IV, mouth rinse, sodium chloride flush   Infusions:     Antimicrobials: Anti-infectives (From admission, onward)    None       Objective: Vitals:   05/05/23 0601 05/05/23 0800  BP: 115/74 100/87  Pulse: 62 68  Resp: 17 17  Temp: 97.7 F (36.5 C) 97.6 F (36.4 C)  SpO2: 96% 100%    Intake/Output Summary (Last 24 hours) at 05/05/2023 1113 Last data filed at 05/05/2023 0100 Gross per 24 hour  Intake 256.4  ml  Output 500 ml  Net -243.6 ml   Filed Weights   05/03/23 1934  Weight: 78 kg   Weight change:  Body mass index is 23.98 kg/m.   Physical Exam: General exam: Pleasant, elderly African-American male Skin: No rashes, lesions or ulcers. HEENT: Atraumatic, normocephalic, no obvious bleeding Lungs: Clear to auscultation bilaterally,  CVS: S1, S2, no murmur,   GI/Abd: Soft, nontender, nondistended, bowel sound present,   CNS: Alert, awake, oriented to place and person Psychiatry: Mood appropriate,  Extremities: No pedal edema, no calf tenderness.  Right upper extremity swollen.  Data Review: I have personally reviewed the laboratory data and studies available.  F/u labs  Unresulted Labs (From admission, onward)     Start     Ordered   05/06/23 0500  Basic metabolic panel  Tomorrow morning,   R        05/05/23 0943   05/06/23 0500  CBC with Differential/Platelet  Tomorrow morning,   R        05/05/23 0943            Total time spent in review of labs and imaging, patient evaluation, formulation of plan,  documentation and communication with family: 55 minutes  Signed, Lorin Glass, MD [facility] 05/05/2023

## 2023-05-05 NOTE — TOC Initial Note (Signed)
Transition of Care [facility]) - Initial/Assessment Note    Patient Details  Name: Brandon [name] MRN: [id number] Date of Birth: 1940/04/26  Transition of Care [facility]) CM/SW Contact:    Carley Hammed, LCSW Phone Number: 05/05/2023, 11:18 AM  Clinical Narrative:                 CSW reviewed chart and noted pt is from [place]. CSW spoke with [facility] and was advised pt is from [facility]. Per facility rep, pt has been evaluated for [facility] twice, but has never met criteria. Facility states pt's spouse is at home and pt wants to get stronger to go home with her. They are agreeable for pt to return with new auth. CSW advised MD of barriers and MD states hopeful Palliative sees him today. CSW attempted to see pt but he was asleep. Will complete workup with PT/OT recs.   Expected Discharge Plan: Skilled Nursing Facility Barriers to Discharge: Continued Medical Work up, English as a second language teacher   Patient Goals and CMS Choice            Expected Discharge Plan and Services                                              Prior Living Arrangements/Services     Patient language and need for interpreter reviewed:: Yes        Need for Family Participation in Patient Care: Yes (Comment) Care giver support system in place?: Yes (comment)   Criminal Activity/Legal Involvement Pertinent to Current Situation/Hospitalization: No - Comment as needed  Activities of Daily Living   ADL Screening (condition at time of admission) Independently performs ADLs?: Yes (appropriate for developmental age) Is the patient deaf or have difficulty hearing?: No Does the patient have difficulty seeing, even when wearing glasses/contacts?: No Does the patient have difficulty concentrating, remembering, or making decisions?: No  Permission Sought/Granted                  Emotional Assessment Appearance:: Appears stated age     Orientation: : Oriented to Self, Oriented to Place, Oriented to   Time, Oriented to Situation Alcohol / Substance Use: Not Applicable Psych Involvement: No (comment)  Admission diagnosis:  DVT (deep vein thrombosis) in pregnancy [O22.30] Acute deep vein thrombosis (DVT) of other vein of right upper extremity (HCC) [I82.621] Patient Active Problem List   Diagnosis Date Noted   Acute deep vein thrombosis (DVT) of right upper extremity (HCC) 05/04/2023   Paroxysmal atrial fibrillation (HCC) 05/04/2023   Chronic diastolic CHF (congestive heart failure) (HCC) 05/04/2023   Chronic kidney disease (CKD), stage IV (severe) (HCC) 05/04/2023   History of COPD 05/04/2023   Pulmonary hypertension (HCC) 05/04/2023   HFrEF (heart failure with reduced ejection fraction) (HCC) 04/24/2023   Cardiogenic shock (HCC) 04/16/2023   Atrial fibrillation with RVR (HCC) 04/15/2023   Persistent atrial fibrillation (HCC) 01/14/2023   Mitral regurgitation 01/14/2023   Thrombus of left atrial appendage 01/14/2023   Acute kidney injury superimposed on chronic kidney disease (HCC) 01/14/2023   Acute on chronic systolic heart failure (HCC) 01/10/2023   Neuropathy 09/21/2022   Abnormal brain MRI 04/21/2022   Hyperlipidemia 04/21/2022   CVA (cerebral vascular accident) (HCC) 04/18/2022   Normocytic anemia 04/18/2022   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   Coronary artery calcification 03/30/2022   B12 deficiency 08/13/2020   Rheumatoid  arthritis (HCC) 08/12/2020   BPH (benign prostatic hyperplasia) 08/12/2020   Folic acid deficiency 08/12/2020   Stage 3a chronic kidney disease (HCC) 10/17/2019   Heme positive stool 02/01/2019   RBBB 10/03/2018   Depression 12/09/2015   Asthma 08/13/2009   Hyperglycemia 08/13/2009   OSA (obstructive sleep apnea) 01/23/2009   CHRONIC SYSTOLIC HEART FAILURE 09/11/2008   Obesity, unspecified 08/26/2008   Disorder resulting from impaired renal function 08/26/2008   Essential hypertension 06/28/2008   Cardiomyopathy (HCC) 06/28/2008   PCP:  [name], NP Pharmacy:   [facility] [address] [place], [place] - [address] [place] [address] Phone: [phone] Fax: [phone]  [facility] [address] [place] [address] Phone: [phone] Fax: [phone]     Social Drivers of Health (SDOH) Social History: SDOH Screenings   Food Insecurity: No Food Insecurity (05/04/2023)  Housing: Low Risk  (05/04/2023)  Transportation Needs: No Transportation Needs (05/04/2023)  Utilities: Not At Risk (05/04/2023)  Alcohol Screen: Low Risk  (07/01/2022)  Depression (PHQ2-9): Low Risk  (02/03/2023)  Financial Resource Strain: Low Risk  (01/13/2023)  Physical Activity: Insufficiently Active (07/25/2020)  Social Connections: Socially Integrated (05/04/2023)  Stress: No Stress Concern Present (05/28/2021)  Tobacco Use: Medium Risk (05/03/2023)  Health Literacy: Adequate Health Literacy (01/27/2023)   SDOH Interventions:     Readmission Risk Interventions     No data to display

## 2023-05-05 NOTE — Plan of Care (Signed)
Pt transitioned to Eliquis today and Heparin drip DC'd.

## 2023-05-06 DIAGNOSIS — I82621 Acute embolism and thrombosis of deep veins of right upper extremity: Secondary | ICD-10-CM | POA: Diagnosis not present

## 2023-05-06 LAB — GLUCOSE, CAPILLARY
Glucose-Capillary: 83 mg/dL (ref 70–99)
Glucose-Capillary: 103 mg/dL — ABNORMAL HIGH (ref 70–99)
Glucose-Capillary: 135 mg/dL — ABNORMAL HIGH (ref 70–99)
Glucose-Capillary: 129 mg/dL — ABNORMAL HIGH (ref 70–99)

## 2023-05-06 NOTE — Plan of Care (Signed)

## 2023-05-06 NOTE — Progress Notes (Signed)
Pt refused lab draws rt now. Asked phlebotomist if she can come back later and retry to draw blood.

## 2023-05-06 NOTE — Progress Notes (Addendum)
PROGRESS NOTE  Brandon [name]  DOB: Jun 24, 1940  PCP: Brandon Craze, NP WGN:[id number]  DOA: 05/03/2023  LOS: 2 days  Hospital Day: 4  Brief narrative: Brandon [name] is a 83 y.o. male with PMH significant for HTN, stroke, OSA not on CPAP, systolic CHF, nonischemic cardiomyopathy, paroxysmal A-fib, pulmonary hypertension was recently hospitalized under cardiology service 1/31 to 2/13 for cardiogenic shock secondary to recurrent A-fib.  He was on Lasix drip, milrinone drip, underwent successful DCCV.  He was deemed not a candidate for advanced therapies given his age, frailty and poor kidney function.  Palliative care was consulted for GOC discussion.  He was made DNR and also subsequently transition to comfort care measures.  He was discharged to nursing facility plan for outpatient palliative care follow-up and transition to residential hospice once appropriate.  Some of the medications including Eliquis were discontinued at discharge.  At the SNF, patient had poor appetite, hydration and weakness.  He also had progressive swelling of his right upper arm. 2/18, patient had an ultrasound duplex of upper extremity and noted to have right arm DVT.  Cardiology service was called.  Cardiology suggested to resume Eliquis at a higher dose of 10 mg twice daily for a week then decrease to 5 mg twice daily.  However it seems the facility still wanted the patient to be transferred to the hospital and hence he was brought to ED at [facility].  In the ED, patient was afebrile, hemodynamically stable BUN/creatinine 59/2.65 ED physician has been consulted and spoken with vascular surgeon Dr. Randie Heinz who recommended to obtain CT chest.  CTA chest showed diffusely dilated, mildly hyperdense, and demonstrates extensive perivascular inflammatory stranding in keeping with changes of thrombosis and possible superimposed thrombophlebitis. A central obstructing lesion is not clearly identified. The innominate vein  is of relatively narrow caliber, however, its patency is not well assessed on this noncontrast examination.  Patient was started on IV heparin drip. Admitted to [facility] Vascular surgery was consulted  Subjective: Patient was seen and examined this morning. Comfortable in bed.  Not in distress. Tearful talking about several pricks he has had for blood draws.  Wants to avoid any more blood draw Pending SNF placement  Assessment and plan: Acute DVT of right sided subclavian vein DVT identified in ultrasound duplex obtained as an outpatient for few days of progressive swelling.  Patient was recently in the hospital and had central lines in place used for milrinone drip, Lasix drip. As he was transitioned to comfort care, Eliquis was held at discharge. Now with new DVT, patient has been started on IV heparin drip as recommended by vascular surgery. Vascular surgery consulted and recommended no intervention. Switch back from heparin drip to Eliquis today. Recommended to keep right upper extremity elevated. Palliative care consulted appreciated.  Chronic systolic CHF Nonischemic cardiomyopathy Recent hospitalized for cardiogenic shock. Recent echo from 2/4 with EF 20 to 25%. Most of meds on GDMT were discontinued at discharge. Currently continued on torsemide 40 mg daily Hemodynamically stable.  Renal function improving. I will continue the same plan today.  CKD stage 4 Creatinine remains at baseline  Recent Labs    04/20/23 0452 04/21/23 0228 04/22/23 7846 04/23/23 0527 04/25/23 0320 04/26/23 0251 04/27/23 0226 04/28/23 0252 05/03/23 2146 05/04/23 0520  BUN 77* 88* 83* 80* 79* 83* 85* 88* 59* 60*  CREATININE 3.19* 3.59* 3.63* 3.36* 3.41* 3.49* 3.18* 3.32* 2.65* 2.80*   Paroxysmal A-fib Continue amiodarone Eliquis as above.    H/o  CVA HLD Eliquis as above.  Lipitor was stopped last admission  Type 2 diabetes mellitus A1c 5.1 on 2/4 Diet-controlled   Peripheral  neuropathy Currently not on gabapentin   COPD Stable O2 sat 100% room air Continued DuoNeb as needed and Dulera twice daily.   Mobility: Encourage ambulation.  Goals of care   Code Status: Limited: Do not attempt resuscitation (DNR) -DNR-LIMITED -Do Not Intubate/DNI      DVT prophylaxis:  SCDs Start: 05/04/23 0128 Place TED hose Start: 05/04/23 0128 apixaban (ELIQUIS) tablet 10 mg  apixaban (ELIQUIS) tablet 5 mg   Antimicrobials: None Fluid: None Consultants: Palliative care Family Communication: None at bedside  Status: Patient Level of care:  Telemetry Medical   Patient is from: [facility] SNF Needs to continue in-hospital care: Stable for discharge back to SNF, pending insurance authorization   Diet:  Diet Order             DIET DYS 2 Room service appropriate? Yes; Fluid consistency: Thin; Fluid restriction: 2000 mL Fluid  Diet effective now                   Scheduled Meds:  amiodarone  200 mg Oral Daily   apixaban  10 mg Oral BID   Followed by   Melene Muller ON 05/12/2023] apixaban  5 mg Oral BID   gabapentin  300 mg Oral QHS   mometasone-formoterol  2 puff Inhalation BID   pantoprazole  40 mg Oral Daily   sodium chloride flush  3 mL Intravenous Q12H   torsemide  40 mg Oral Daily    PRN meds: acetaminophen **OR** acetaminophen, ipratropium-albuterol, ondansetron **OR** ondansetron (ZOFRAN) IV, mouth rinse, sodium chloride flush   Infusions:     Antimicrobials: Anti-infectives (From admission, onward)    None       Objective: Vitals:   05/06/23 0800 05/06/23 0832  BP: 104/77   Pulse: 70   Resp: 17   Temp: 98.5 F (36.9 C)   SpO2: 100% 97%    Intake/Output Summary (Last 24 hours) at 05/06/2023 1325 Last data filed at 05/06/2023 1000 Gross per 24 hour  Intake 840 ml  Output 1850 ml  Net -1010 ml   Filed Weights   05/03/23 1934  Weight: 78 kg   Weight change:  Body mass index is 23.98 kg/m.   Physical Exam: General  exam: Pleasant, elderly African-American male. Skin: No rashes, lesions or ulcers. HEENT: Atraumatic, normocephalic, no obvious bleeding Lungs: Clear to auscultation bilaterally,  CVS: S1, S2, no murmur,   GI/Abd: Soft, nontender, nondistended, bowel sound present,   CNS: Alert, awake, oriented to place and person Psychiatry: Mood appropriate. Extremities: No pedal edema, no calf tenderness.  Right upper extremity swollen.  Data Review: I have personally reviewed the laboratory data and studies available.  F/u labs  Unresulted Labs (From admission, onward)    None       Total time spent in review of labs and imaging, patient evaluation, formulation of plan, documentation and communication with family: 25 minutes  Signed, Lorin Glass, MD [facility] 05/06/2023

## 2023-05-06 NOTE — Care Management Important Message (Signed)
Important Message  Patient Details  Name: Calvert E [place] MRN: [id number] Date of Birth: 12-07-1940   Important Message Given:  Yes - Medicare IM     Sherilyn Banker 05/06/2023, 3:30 PM

## 2023-05-06 NOTE — Plan of Care (Signed)
  Problem: Clinical Measurements: Goal: Ability to maintain clinical measurements within normal limits will improve Outcome: Progressing   Problem: Activity: Goal: Risk for activity intolerance will decrease Outcome: Progressing   Problem: Nutrition: Goal: Adequate nutrition will be maintained Outcome: Progressing   Problem: Elimination: Goal: Will not experience complications related to urinary retention Outcome: Progressing   Problem: Pain Managment: Goal: General experience of comfort will improve and/or be controlled Outcome: Progressing

## 2023-05-06 NOTE — Progress Notes (Signed)
Pt refused lab draw again this morning citing him being a hard stick and adding that the MD said he didn't need anymore lab draws. MD contacted and he responded that that's not what he told him but he was ok skipping labs today.

## 2023-05-07 DIAGNOSIS — I82621 Acute embolism and thrombosis of deep veins of right upper extremity: Secondary | ICD-10-CM | POA: Diagnosis not present

## 2023-05-07 LAB — GLUCOSE, CAPILLARY
Glucose-Capillary: 111 mg/dL — ABNORMAL HIGH (ref 70–99)
Glucose-Capillary: 97 mg/dL (ref 70–99)
Glucose-Capillary: 105 mg/dL — ABNORMAL HIGH (ref 70–99)
Glucose-Capillary: 92 mg/dL (ref 70–99)

## 2023-05-07 NOTE — TOC Progression Note (Addendum)
 Transition of Care [facility]) - Progression Note    Patient Details  Name: Brandon [name] MRN: [id number] Date of Birth: [birth year]  Transition [facility] Care [facility]) CM/SW Contact  Donnalee Curry, LCSWA Phone Number: 05/07/2023, 9:01 AM  Clinical Narrative:     SW spoke with Elmarie Shiley [facility] [phone]) Berkley Harvey started. Requested clinicals faxed to: [phone]. UYQ#[id number]  Pt does not have any PT/OT notes to submit for auth. MD notified via secure chat.  SW submitted current clinicals, will need PT/OT notes submitted once seen.   Expected Discharge Plan: Skilled Nursing Facility Barriers to Discharge: Continued Medical Work up, English as a second language teacher  Expected Discharge Plan and Services                                               Social Determinants of Health (SDOH) Interventions SDOH Screenings   Food Insecurity: No Food Insecurity (05/04/2023)  Housing: Low Risk  (05/04/2023)  Transportation Needs: No Transportation Needs (05/04/2023)  Utilities: Not At Risk (05/04/2023)  Alcohol Screen: Low Risk  (07/01/2022)  Depression (PHQ2-9): Low Risk  (02/03/2023)  Financial Resource Strain: Low Risk  (01/13/2023)  Physical Activity: Insufficiently Active (07/25/2020)  Social Connections: Socially Integrated (05/04/2023)  Stress: No Stress Concern Present (05/28/2021)  Tobacco Use: Medium Risk (05/03/2023)  Health Literacy: Adequate Health Literacy (01/27/2023)    Readmission Risk Interventions     No data to display

## 2023-05-07 NOTE — Plan of Care (Signed)

## 2023-05-07 NOTE — Progress Notes (Signed)
 PROGRESS NOTE  Brandon [name]  DOB: [birth year]  PCP: [name], NP ZOX:[id number]  DOA: 05/03/2023  LOS: 3 days  Hospital Day: 5  Brief narrative: Brandon [name] is a 83 y.o. male with PMH significant for HTN, stroke, OSA not on CPAP, systolic CHF, nonischemic cardiomyopathy, paroxysmal A-fib, pulmonary hypertension was recently hospitalized under cardiology service 1/31 to 2/13 for cardiogenic shock secondary to recurrent A-fib.  He was on Lasix drip, milrinone drip, underwent successful DCCV.  He was deemed not a candidate for advanced therapies given his age, frailty and poor kidney function.  Palliative care was consulted for GOC discussion.  He was made DNR and also subsequently transition to comfort care measures.  He was discharged to nursing facility plan for outpatient palliative care follow-up and transition to residential hospice once appropriate.  Some of the medications including Eliquis were discontinued at discharge.  At the SNF, patient had poor appetite, hydration and weakness.  He also had progressive swelling of his right upper arm. 2/18, patient had an ultrasound duplex of upper extremity and noted to have right arm DVT.  Cardiology service was called.  Cardiology suggested to resume Eliquis at a higher dose of 10 mg twice daily for a week then decrease to 5 mg twice daily.  However it seems the facility still wanted the patient to be transferred to the hospital and hence he was brought to ED at [facility].  In the ED, patient was afebrile, hemodynamically stable BUN/creatinine 59/2.65 ED physician has been consulted and spoken with vascular surgeon Dr. Randie Heinz who recommended to obtain CT chest.  CTA chest showed diffusely dilated, mildly hyperdense, and demonstrates extensive perivascular inflammatory stranding in keeping with changes of thrombosis and possible superimposed thrombophlebitis. A central obstructing lesion is not clearly identified. The innominate vein  is of relatively narrow caliber, however, its patency is not well assessed on this noncontrast examination.  Patient was started on IV heparin drip. Admitted to [facility] Vascular surgery was consulted  Subjective: Patient was seen and examined this morning. Propped up in bed.  Not in distress no new symptoms. Waiting for SNF.  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  Patient was recently in the hospital and had central lines in place used for milrinone drip, Lasix drip. As he was transitioned to comfort care, Eliquis was held at discharge. Now with new DVT, patient has been started on IV heparin drip as recommended by vascular surgery. Vascular surgery consulted and recommended no intervention. Switch back from heparin drip to Eliquis today. Recommended to keep right upper extremity elevated. Palliative care consulted appreciated.  Chronic systolic CHF Nonischemic cardiomyopathy Recent hospitalized for cardiogenic shock. Recent echo from 2/4 with EF 20 to 25%. Most of meds on GDMT were discontinued at discharge. Currently continued on torsemide 40 mg daily Hemodynamically stable.  Renal function improving. I will continue the same plan today.  CKD stage 4 Creatinine remains at baseline  Recent Labs    04/20/23 0452 04/21/23 0228 04/22/23 8119 04/23/23 0527 04/25/23 0320 04/26/23 0251 04/27/23 0226 04/28/23 0252 05/03/23 2146 05/04/23 0520  BUN 77* 88* 83* 80* 79* 83* 85* 88* 59* 60*  CREATININE 3.19* 3.59* 3.63* 3.36* 3.41* 3.49* 3.18* 3.32* 2.65* 2.80*   Paroxysmal A-fib Continue amiodarone Eliquis as above.    H/o CVA HLD Eliquis as above.  Lipitor was stopped last admission  Type 2 diabetes  mellitus A1c 5.1 on 2/4 Diet-controlled   Peripheral neuropathy Currently not on gabapentin   COPD Stable O2 sat 100% room air Continued DuoNeb as  needed and Dulera twice daily.   Mobility: Encourage ambulation.  Goals of care   Code Status: Limited: Do not attempt resuscitation (DNR) -DNR-LIMITED -Do Not Intubate/DNI      DVT prophylaxis:  SCDs Start: 05/04/23 0128 Place TED hose Start: 05/04/23 0128 apixaban (ELIQUIS) tablet 10 mg  apixaban (ELIQUIS) tablet 5 mg   Antimicrobials: None Fluid: None Consultants: Palliative care Family Communication: None at bedside  Status: Patient Level of care:  Telemetry Medical   Patient is from: [facility] SNF Needs to continue in-hospital care: Stable for discharge back to SNF, pending insurance authorization   Diet:  Diet Order             DIET DYS 2 Room service appropriate? Yes; Fluid consistency: Thin; Fluid restriction: 2000 mL Fluid  Diet effective now                   Scheduled Meds:  amiodarone  200 mg Oral Daily   apixaban  10 mg Oral BID   Followed by   Melene Muller ON 05/12/2023] apixaban  5 mg Oral BID   gabapentin  300 mg Oral QHS   mometasone-formoterol  2 puff Inhalation BID   pantoprazole  40 mg Oral Daily   sodium chloride flush  3 mL Intravenous Q12H   torsemide  40 mg Oral Daily    PRN meds: acetaminophen **OR** acetaminophen, ipratropium-albuterol, ondansetron **OR** ondansetron (ZOFRAN) IV, mouth rinse, sodium chloride flush   Infusions:     Antimicrobials: Anti-infectives (From admission, onward)    None       Objective: Vitals:   05/07/23 0526 05/07/23 0808  BP: 106/84 115/84  Pulse: 76 75  Resp: 17 18  Temp: 97.7 F (36.5 C) 98.2 F (36.8 C)  SpO2: 100% 98%    Intake/Output Summary (Last 24 hours) at 05/07/2023 1201 Last data filed at 05/07/2023 0833 Gross per 24 hour  Intake 940 ml  Output 1800 ml  Net -860 ml   Filed Weights   05/03/23 1934  Weight: 78 kg   Weight change:  Body mass index is 23.98 kg/m.   Physical Exam: General exam: Pleasant, elderly African-American male. Skin: No rashes, lesions or  ulcers. HEENT: Atraumatic, normocephalic, no obvious bleeding Lungs: Clear to auscultation bilaterally,  CVS: S1, S2, no murmur,   GI/Abd: Soft, nontender, nondistended, bowel sound present,   CNS: Alert, awake, oriented to place and person Psychiatry: Mood appropriate. Extremities: No pedal edema, no calf tenderness.  Right upper extremity swollen.  Data Review: I have personally reviewed the laboratory data and studies available.  F/u labs  Unresulted Labs (From admission, onward)    None       Total time spent in review of labs and imaging, patient evaluation, formulation of plan, documentation and communication with family: 25 minutes  Signed, Lorin Glass, MD [facility] 05/07/2023

## 2023-05-08 LAB — GLUCOSE, CAPILLARY
Glucose-Capillary: 112 mg/dL — ABNORMAL HIGH (ref 70–99)
Glucose-Capillary: 116 mg/dL — ABNORMAL HIGH (ref 70–99)

## 2023-05-08 MED ORDER — MUSCLE RUB 10-15 % EX CREA
1.0000 | TOPICAL_CREAM | CUTANEOUS | Status: DC | PRN
  Administered 2023-05-08: 1 via TOPICAL
  Filled 2023-05-08: qty 85

## 2023-05-08 NOTE — Evaluation (Signed)
 Physical Therapy Evaluation Patient Details Name: Brandon [name] MRN: [id number] DOB: 1940-05-08 Today's Date: 05/08/2023  History of Present Illness  83 y.o. male presents to [facility] from [facility] 05/03/23 for R UE swelling, poor appetitive and weakness. Admitted w/ R subclavian vein DVT with chest CT showing extensive perivascular inflammatory standing. Recent admit 1/31-2/13 for cardiogenic shock 2/2 recurrent a-fib. PMHx: HTN, stroke, OSA not on CPAP, systolic CHF, nonischemic cardiomyopathy, paroxysmal A-fib, pulmonary hypertension, peripheral neuropathy   Clinical Impression  Pt in bed upon arrival and agreeable to PT eval. Pt was ModI with a RW prior to admit for short distances. In today's session pt was able to stand with CGA and RW and ambulate ~40 ft with MinA. Pt requires assist and cueing for RW management as pt tends to veer into obstacles. Pt was previously at SNF with plans to return once medically stable. Pt currently with functional limitations due to the deficits listed below (see PT Problem List). Pt would benefit from acute skilled PT to address functional impairments. Acute PT to follow.         If plan is discharge home, recommend the following: A little help with walking and/or transfers;A little help with bathing/dressing/bathroom;Assistance with cooking/housework;Assist for transportation;Help with stairs or ramp for entrance   Can travel by private vehicle   Yes    Equipment Recommendations None recommended by PT     Functional Status Assessment Patient has had a recent decline in their functional status and/or demonstrates limited ability to make significant improvements in function in a reasonable and predictable amount of time     Precautions / Restrictions Precautions Precautions: Fall Restrictions Weight Bearing Restrictions Per Provider Order: No      Mobility  Bed Mobility Overal bed mobility: Needs Assistance Bed Mobility: Supine to Sit    Supine to  sit: HOB elevated, Supervision    General bed mobility comments: supervision for safety    Transfers Overall transfer level: Needs assistance Equipment used: Rolling walker (2 wheels) Transfers: Sit to/from Stand Sit to Stand: Contact guard assist    General transfer comment: CGA for safety, uses B UE to push from EOB    Ambulation/Gait Ambulation/Gait assistance: Contact guard assist, Min assist Gait Distance (Feet): 40 Feet Assistive device: Rolling walker (2 wheels) Gait Pattern/deviations: Step-through pattern, Decreased stride length, Drifts right/left, Trunk flexed, Narrow base of support Gait velocity: Decreased     General Gait Details: slow and steady gait w/ no LOB. MinA for cues for obstacle negotiation as pt tends to veer with RW      Balance Overall balance assessment: Needs assistance, Mild deficits observed, not formally tested Sitting-balance support: No upper extremity supported, Feet supported Sitting balance-Leahy Scale: Fair     Standing balance support: Bilateral upper extremity supported, Reliant on assistive device for balance, During functional activity Standing balance-Leahy Scale: Poor Standing balance comment: reliant on RW  High Level Balance Comments: Unable to stand w/o using B UE           Pertinent Vitals/Pain Pain Assessment Pain Assessment: Faces Faces Pain Scale: Hurts a little bit Pain Location: R UE Pain Descriptors / Indicators: Constant, Throbbing Pain Intervention(s): Limited activity within patient's tolerance, Monitored during session, Repositioned    Home Living Family/patient expects to be discharged to:: Skilled nursing facility    Home Equipment: Gilmer Mor - single Librarian, academic (2 wheels)      Prior Function Prior Level of Function : Independent/Modified Independent    Mobility Comments: ModI with  RW ADLs Comments: ind     Extremity/Trunk Assessment   Upper Extremity Assessment Upper Extremity Assessment:  Overall WFL for tasks assessed    Lower Extremity Assessment Lower Extremity Assessment: Generalized weakness    Cervical / Trunk Assessment Cervical / Trunk Assessment: Kyphotic  Communication   Communication Communication: No apparent difficulties    Cognition Arousal: Alert Behavior During Therapy: WFL for tasks assessed/performed   PT - Cognitive impairments: No apparent impairments     PT - Cognition Comments: A&Ox4, able to follow multi-step commands Following commands: Intact       Cueing Cueing Techniques: Verbal cues, Tactile cues     General Comments General comments (skin integrity, edema, etc.): VSS on RA, slightly increased edema in R UE     PT Assessment Patient needs continued PT services  PT Problem List Decreased strength;Decreased activity tolerance;Decreased balance;Decreased mobility;Decreased coordination;Decreased knowledge of use of DME;Cardiopulmonary status limiting activity       PT Treatment Interventions DME instruction;Gait training;Stair training;Functional mobility training;Therapeutic activities;Therapeutic exercise;Balance training;Patient/family education    PT Goals (Current goals can be found in the Care Plan section)  Acute Rehab PT Goals Patient Stated Goal: to get stronger PT Goal Formulation: With patient Time For Goal Achievement: 05/22/23 Potential to Achieve Goals: Good    Frequency Min 1X/week        AM-PAC PT "6 Clicks" Mobility  Outcome Measure Help needed turning from your back to your side while in a flat bed without using bedrails?: A Little Help needed moving from lying on your back to sitting on the side of a flat bed without using bedrails?: A Little Help needed moving to and from a bed to a chair (including a wheelchair)?: A Little Help needed standing up from a chair using your arms (e.g., wheelchair or bedside chair)?: A Little Help needed to walk in hospital room?: A Little Help needed climbing 3-5 steps  with a railing? : A Lot 6 Click Score: 17    End of Session Equipment Utilized During Treatment: Gait belt Activity Tolerance: Patient tolerated treatment well Patient left: in chair;with call bell/phone within reach;with chair alarm set Nurse Communication: Mobility status PT Visit Diagnosis: Unsteadiness on feet (R26.81);Other abnormalities of gait and mobility (R26.89);Muscle weakness (generalized) (M62.81)    Time: 8657-8469 PT Time Calculation (min) (ACUTE ONLY): 13 min   Charges:   PT Evaluation $PT Eval Low Complexity: 1 Low   PT General Charges $$ ACUTE PT VISIT: 1 Visit       Hilton Cork, PT, DPT Secure Chat Preferred  Rehab Office [phone]   Arturo Morton Brion Aliment 05/08/2023, 12:09 PM

## 2023-05-08 NOTE — TOC Progression Note (Signed)
 Transition of Care [facility]) - Progression Note    Patient Details  Name: Brandon [name] MRN: [id number] Date of Birth: 20-May-1940  Transition of Care [facility]) CM/SW Contact  Donnalee Curry, LCSWA Phone Number: 05/08/2023, 12:36 PM  Clinical Narrative:     SW faxed PT note to [facility]   Expected Discharge Plan: Skilled Nursing Facility Barriers to Discharge: Continued Medical Work up, English as a second language teacher  Expected Discharge Plan and Services                                               Social Determinants of Health (SDOH) Interventions SDOH Screenings   Food Insecurity: No Food Insecurity (05/04/2023)  Housing: Low Risk  (05/04/2023)  Transportation Needs: No Transportation Needs (05/04/2023)  Utilities: Not At Risk (05/04/2023)  Alcohol Screen: Low Risk  (07/01/2022)  Depression (PHQ2-9): Low Risk  (02/03/2023)  Financial Resource Strain: Low Risk  (01/13/2023)  Physical Activity: Insufficiently Active (07/25/2020)  Social Connections: Socially Integrated (05/04/2023)  Stress: No Stress Concern Present (05/28/2021)  Tobacco Use: Medium Risk (05/03/2023)  Health Literacy: Adequate Health Literacy (01/27/2023)    Readmission Risk Interventions     No data to display

## 2023-05-08 NOTE — Plan of Care (Signed)
  Problem: Education: Goal: Knowledge of General Education information will improve Description: Including pain rating scale, medication(s)/side effects and non-pharmacologic comfort measures Outcome: Progressing   Problem: Clinical Measurements: Goal: Ability to maintain clinical measurements within normal limits will improve Outcome: Progressing   Problem: Activity: Goal: Risk for activity intolerance will decrease Outcome: Progressing   Problem: Nutrition: Goal: Adequate nutrition will be maintained Outcome: Progressing   Problem: Elimination: Goal: Will not experience complications related to bowel motility Outcome: Progressing Goal: Will not experience complications related to urinary retention Outcome: Progressing   Problem: Pain Managment: Goal: General experience of comfort will improve and/or be controlled Outcome: Progressing   Problem: Safety: Goal: Ability to remain free from injury will improve Outcome: Progressing

## 2023-05-08 NOTE — Plan of Care (Signed)
   Problem: Education: Goal: Knowledge of General Education information will improve Description: Including pain rating scale, medication(s)/side effects and non-pharmacologic comfort measures Outcome: Progressing   Problem: Pain Managment: Goal: General experience of comfort will improve and/or be controlled Outcome: Progressing   Problem: Safety: Goal: Ability to remain free from injury will improve Outcome: Progressing

## 2023-05-08 NOTE — Progress Notes (Signed)
 PROGRESS NOTE  Brandon [name]  DOB: [birth year]  PCP: [name], NP QMV:[id number]  DOA: 05/03/2023  LOS: 4 days  Hospital Day: 6  Brief narrative: Brandon [name] is a 83 y.o. male with PMH significant for HTN, stroke, OSA not on CPAP, systolic CHF, nonischemic cardiomyopathy, paroxysmal A-fib, pulmonary hypertension was recently hospitalized under cardiology service 1/31 to 2/13 for cardiogenic shock secondary to recurrent A-fib.  He was on Lasix drip, milrinone drip, underwent successful DCCV.  He was deemed not a candidate for advanced therapies given his age, frailty and poor kidney function.  Palliative care was consulted for GOC discussion.  He was made DNR and also subsequently transition to comfort care measures.  He was discharged to nursing facility plan for outpatient palliative care follow-up and transition to residential hospice once appropriate.  Some of the medications including Eliquis were discontinued at discharge.  At the SNF, patient had poor appetite, hydration and weakness.  He also had progressive swelling of his right upper arm. 2/18, patient had an ultrasound duplex of upper extremity and noted to have right arm DVT.  Cardiology service was called.  Cardiology suggested to resume Eliquis at a higher dose of 10 mg twice daily for a week then decrease to 5 mg twice daily.  However it seems the facility still wanted the patient to be transferred to the hospital and hence he was brought to ED at [facility].  In the ED, patient was afebrile, hemodynamically stable BUN/creatinine 59/2.65 ED physician has been consulted and spoken with vascular surgeon Dr. Randie Heinz who recommended to obtain CT chest.  CTA chest showed diffusely dilated, mildly hyperdense, and demonstrates extensive perivascular inflammatory stranding in keeping with changes of thrombosis and possible superimposed thrombophlebitis. A central obstructing lesion is not clearly identified. The innominate vein  is of relatively narrow caliber, however, its patency is not well assessed on this noncontrast examination.  Patient was started on IV heparin drip. Admitted to [facility] Vascular surgery was consulted  Subjective: Patient was seen and examined this morning. Propped up in bed.  Not in distress no new symptoms. Waiting for SNF. No new symptoms.  Essentially no change in last 48 hours.  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  Patient was recently in the hospital and had central lines in place used for milrinone drip, Lasix drip. As he was transitioned to comfort care, Eliquis was held at discharge. Now with new DVT, patient has been started on IV heparin drip as recommended by vascular surgery. Vascular surgery consulted and recommended no intervention. Switch back from heparin drip to Eliquis today. Recommended to keep right upper extremity elevated. Palliative care consulted appreciated.  Chronic systolic CHF Nonischemic cardiomyopathy Recent hospitalized for cardiogenic shock. Recent echo from 2/4 with EF 20 to 25%. Most of meds on GDMT were discontinued at discharge. Currently continued on torsemide 40 mg daily Hemodynamically stable.  Renal function improving. I will continue the same plan today.  CKD stage 4 Creatinine remains at baseline  Recent Labs    04/20/23 0452 04/21/23 0228 04/22/23 2841 04/23/23 0527 04/25/23 0320 04/26/23 0251 04/27/23 0226 04/28/23 0252 05/03/23 2146 05/04/23 0520  BUN 77* 88* 83* 80* 79* 83* 85* 88* 59* 60*  CREATININE 3.19* 3.59* 3.63* 3.36* 3.41* 3.49* 3.18* 3.32* 2.65* 2.80*   Paroxysmal A-fib Continue amiodarone Eliquis as above.    H/o CVA HLD Eliquis as  above.  Lipitor was stopped last admission  Type 2 diabetes mellitus A1c 5.1 on 2/4 Diet-controlled   Peripheral neuropathy Currently not on gabapentin    COPD Stable O2 sat 100% room air Continued DuoNeb as needed and Dulera twice daily.   Mobility: Encourage ambulation.  Goals of care   Code Status: Limited: Do not attempt resuscitation (DNR) -DNR-LIMITED -Do Not Intubate/DNI      DVT prophylaxis:  SCDs Start: 05/04/23 0128 Place TED hose Start: 05/04/23 0128 apixaban (ELIQUIS) tablet 10 mg  apixaban (ELIQUIS) tablet 5 mg   Antimicrobials: None Fluid: None Consultants: Palliative care Family Communication: None at bedside  Status: Patient Level of care:  Telemetry Medical   Patient is from: [facility] SNF Needs to continue in-hospital care: Stable for discharge back to SNF, pending insurance authorization   Diet:  Diet Order             DIET DYS 2 Room service appropriate? Yes; Fluid consistency: Thin; Fluid restriction: 2000 mL Fluid  Diet effective now                   Scheduled Meds:  amiodarone  200 mg Oral Daily   apixaban  10 mg Oral BID   Followed by   Melene Muller ON 05/12/2023] apixaban  5 mg Oral BID   gabapentin  300 mg Oral QHS   mometasone-formoterol  2 puff Inhalation BID   pantoprazole  40 mg Oral Daily   sodium chloride flush  3 mL Intravenous Q12H   torsemide  40 mg Oral Daily    PRN meds: acetaminophen **OR** acetaminophen, ipratropium-albuterol, ondansetron **OR** ondansetron (ZOFRAN) IV, mouth rinse, sodium chloride flush   Infusions:     Antimicrobials: Anti-infectives (From admission, onward)    None       Objective: Vitals:   05/08/23 0839 05/08/23 0924  BP: 107/81 102/81  Pulse: 79 78  Resp: 18   Temp: 98.1 F (36.7 C) 98.8 F (37.1 C)  SpO2: 98% 97%    Intake/Output Summary (Last 24 hours) at 05/08/2023 1448 Last data filed at 05/08/2023 1100 Gross per 24 hour  Intake 1180 ml  Output 1202 ml  Net -22 ml   Filed Weights   05/03/23 1934  Weight: 78 kg   Weight change:  Body mass index is 23.98 kg/m.   Physical Exam: General exam: Pleasant, elderly  African-American male. Skin: No rashes, lesions or ulcers. HEENT: Atraumatic, normocephalic, no obvious bleeding Lungs: Clear to auscultation bilaterally,  CVS: S1, S2, no murmur,   GI/Abd: Soft, nontender, nondistended, bowel sound present,   CNS: Alert, awake, oriented to place and person Psychiatry: Mood appropriate. Extremities: No pedal edema, no calf tenderness.  Right upper extremity swollen.  Data Review: I have personally reviewed the laboratory data and studies available.  F/u labs  Unresulted Labs (From admission, onward)    None      Signed, Lorin Glass, MD [facility] 05/08/2023

## 2023-05-09 DIAGNOSIS — I10 Essential (primary) hypertension: Secondary | ICD-10-CM | POA: Diagnosis not present

## 2023-05-09 DIAGNOSIS — I480 Paroxysmal atrial fibrillation: Secondary | ICD-10-CM | POA: Diagnosis not present

## 2023-05-09 DIAGNOSIS — I82621 Acute embolism and thrombosis of deep veins of right upper extremity: Secondary | ICD-10-CM | POA: Diagnosis not present

## 2023-05-09 DIAGNOSIS — R531 Weakness: Secondary | ICD-10-CM | POA: Diagnosis not present

## 2023-05-09 DIAGNOSIS — J4489 Other specified chronic obstructive pulmonary disease: Secondary | ICD-10-CM | POA: Diagnosis not present

## 2023-05-09 DIAGNOSIS — J441 Chronic obstructive pulmonary disease with (acute) exacerbation: Secondary | ICD-10-CM | POA: Diagnosis not present

## 2023-05-09 DIAGNOSIS — N189 Chronic kidney disease, unspecified: Secondary | ICD-10-CM | POA: Diagnosis not present

## 2023-05-09 DIAGNOSIS — I4811 Longstanding persistent atrial fibrillation: Secondary | ICD-10-CM | POA: Diagnosis not present

## 2023-05-09 DIAGNOSIS — I272 Pulmonary hypertension, unspecified: Secondary | ICD-10-CM | POA: Diagnosis not present

## 2023-05-09 DIAGNOSIS — I2723 Pulmonary hypertension due to lung diseases and hypoxia: Secondary | ICD-10-CM | POA: Diagnosis not present

## 2023-05-09 DIAGNOSIS — I429 Cardiomyopathy, unspecified: Secondary | ICD-10-CM | POA: Diagnosis not present

## 2023-05-09 DIAGNOSIS — I82A11 Acute embolism and thrombosis of right axillary vein: Secondary | ICD-10-CM | POA: Diagnosis not present

## 2023-05-09 DIAGNOSIS — I130 Hypertensive heart and chronic kidney disease with heart failure and stage 1 through stage 4 chronic kidney disease, or unspecified chronic kidney disease: Secondary | ICD-10-CM | POA: Diagnosis not present

## 2023-05-09 DIAGNOSIS — Z743 Need for continuous supervision: Secondary | ICD-10-CM | POA: Diagnosis not present

## 2023-05-09 DIAGNOSIS — I82B11 Acute embolism and thrombosis of right subclavian vein: Secondary | ICD-10-CM | POA: Diagnosis not present

## 2023-05-09 DIAGNOSIS — E785 Hyperlipidemia, unspecified: Secondary | ICD-10-CM | POA: Diagnosis not present

## 2023-05-09 DIAGNOSIS — G9009 Other idiopathic peripheral autonomic neuropathy: Secondary | ICD-10-CM | POA: Diagnosis not present

## 2023-05-09 DIAGNOSIS — N184 Chronic kidney disease, stage 4 (severe): Secondary | ICD-10-CM | POA: Diagnosis not present

## 2023-05-09 DIAGNOSIS — E119 Type 2 diabetes mellitus without complications: Secondary | ICD-10-CM | POA: Diagnosis not present

## 2023-05-09 DIAGNOSIS — G4733 Obstructive sleep apnea (adult) (pediatric): Secondary | ICD-10-CM | POA: Diagnosis not present

## 2023-05-09 DIAGNOSIS — Z7901 Long term (current) use of anticoagulants: Secondary | ICD-10-CM | POA: Diagnosis not present

## 2023-05-09 DIAGNOSIS — D649 Anemia, unspecified: Secondary | ICD-10-CM | POA: Diagnosis not present

## 2023-05-09 DIAGNOSIS — I714 Abdominal aortic aneurysm, without rupture, unspecified: Secondary | ICD-10-CM | POA: Diagnosis not present

## 2023-05-09 DIAGNOSIS — I5022 Chronic systolic (congestive) heart failure: Secondary | ICD-10-CM | POA: Diagnosis not present

## 2023-05-09 DIAGNOSIS — Z7401 Bed confinement status: Secondary | ICD-10-CM | POA: Diagnosis not present

## 2023-05-09 DIAGNOSIS — I5084 End stage heart failure: Secondary | ICD-10-CM | POA: Diagnosis not present

## 2023-05-09 DIAGNOSIS — R5381 Other malaise: Secondary | ICD-10-CM | POA: Diagnosis not present

## 2023-05-09 DIAGNOSIS — J45909 Unspecified asthma, uncomplicated: Secondary | ICD-10-CM | POA: Diagnosis not present

## 2023-05-09 DIAGNOSIS — D631 Anemia in chronic kidney disease: Secondary | ICD-10-CM | POA: Diagnosis not present

## 2023-05-09 DIAGNOSIS — Z8673 Personal history of transient ischemic attack (TIA), and cerebral infarction without residual deficits: Secondary | ICD-10-CM | POA: Diagnosis not present

## 2023-05-09 DIAGNOSIS — M069 Rheumatoid arthritis, unspecified: Secondary | ICD-10-CM | POA: Diagnosis not present

## 2023-05-09 MED ORDER — PANTOPRAZOLE SODIUM 40 MG PO TBEC: 40.0000 mg | DELAYED_RELEASE_TABLET | Freq: Every day | ORAL | Status: DC

## 2023-05-09 MED ORDER — APIXABAN 5 MG PO TABS: ORAL_TABLET | ORAL | Status: DC

## 2023-05-09 NOTE — Discharge Instructions (Signed)
 Information on my medicine - ELIQUIS (apixaban)     Why was Eliquis prescribed for you? Eliquis was prescribed to treat blood clots that may have been found in the veins of your legs (deep vein thrombosis) or in your lungs (pulmonary embolism) and to reduce the risk of them occurring again.  What do You need to know about Eliquis ? The starting dose is 10 mg (two 5 mg tablets) taken TWICE daily for the FIRST SEVEN (7) DAYS, then on  05/12/23  the dose is reduced to ONE 5 mg tablet taken TWICE daily.  Eliquis may be taken with or without food.   Try to take the dose about the same time in the morning and in the evening. If you have difficulty swallowing the tablet whole please discuss with your pharmacist how to take the medication safely.  Take Eliquis exactly as prescribed and DO NOT stop taking Eliquis without talking to the doctor who prescribed the medication.  Stopping may increase your risk of developing a new blood clot.  Refill your prescription before you run out.  After discharge, you should have regular check-up appointments with your healthcare provider that is prescribing your Eliquis.    What do you do if you miss a dose? If a dose of ELIQUIS is not taken at the scheduled time, take it as soon as possible on the same day and twice-daily administration should be resumed. The dose should not be doubled to make up for a missed dose.  Important Safety Information A possible side effect of Eliquis is bleeding. You should call your healthcare provider right away if you experience any of the following: Bleeding from an injury or your nose that does not stop. Unusual colored urine (red or dark brown) or unusual colored stools (red or black). Unusual bruising for unknown reasons. A serious fall or if you hit your head (even if there is no bleeding).  Some medicines may interact with Eliquis and might increase your risk of bleeding or clotting while on Eliquis. To help avoid  this, consult your healthcare provider or pharmacist prior to using any new prescription or non-prescription medications, including herbals, vitamins, non-steroidal anti-inflammatory drugs (NSAIDs) and supplements.  This website has more information on Eliquis (apixaban): http://www.eliquis.com/eliquis/home

## 2023-05-09 NOTE — Progress Notes (Signed)
 Mobility Specialist Progress Note:    05/09/23 1236  Mobility  Activity Ambulated with assistance in hallway  Level of Assistance Contact guard assist, steadying assist  Assistive Device Front wheel walker  Distance Ambulated (ft) 70 ft  Activity Response Tolerated well  Mobility Referral Yes  Mobility visit 1 Mobility  Mobility Specialist Start Time (ACUTE ONLY) 1138  Mobility Specialist Stop Time (ACUTE ONLY) 1150  Mobility Specialist Time Calculation (min) (ACUTE ONLY) 12 min   Received pt in chair having no complaints and agreeable to mobility. Pt was asymptomatic throughout ambulation and returned to room w/o fault. Left in chair w/ call bell in reach and all needs met. Chair alarm on.   Thompson Grayer Mobility Specialist  Please contact vis Secure Chat or  Rehab Office [phone]

## 2023-05-09 NOTE — Progress Notes (Signed)
 Report called to Amy LPN at [facility].

## 2023-05-09 NOTE — [place]2 (Signed)
 [place] MEDICAID [place]2 LEVEL OF CARE FORM     IDENTIFICATION  Patient Name: Brandon [name] Birthdate: September 06, 1940 Sex: male Admission Date (Current Location): 05/03/2023  [facility] and [place] Number:  Producer, television/film/video and Address:  [facility], [address], [place], [place] [address]      Provider Number: [id number]  Attending Physician Name and Address:  Lorin Glass, MD  Relative Name and Phone Number:  Benjaman Kindler Dtr [phone]    Current Level of Care: Hospital Recommended Level of Care: Skilled Nursing Facility Prior Approval Number:    Date Approved/Denied:   PASRR Number: [id number]  Discharge Plan: SNF    Current Diagnoses: Patient Active Problem List   Diagnosis Date Noted   Acute deep vein thrombosis (DVT) of right upper extremity (HCC) 05/04/2023   Paroxysmal atrial fibrillation (HCC) 05/04/2023   Chronic diastolic CHF (congestive heart failure) (HCC) 05/04/2023   Chronic kidney disease (CKD), stage IV (severe) (HCC) 05/04/2023   History of COPD 05/04/2023   Pulmonary hypertension (HCC) 05/04/2023   HFrEF (heart failure with reduced ejection fraction) (HCC) 04/24/2023   Cardiogenic shock (HCC) 04/16/2023   Atrial fibrillation with RVR (HCC) 04/15/2023   Persistent atrial fibrillation (HCC) 01/14/2023   Mitral regurgitation 01/14/2023   Thrombus of left atrial appendage 01/14/2023   Acute kidney injury superimposed on chronic kidney disease (HCC) 01/14/2023   Acute on chronic systolic heart failure (HCC) 01/10/2023   Neuropathy 09/21/2022   Abnormal brain MRI 04/21/2022   Hyperlipidemia 04/21/2022   CVA (cerebral vascular accident) (HCC) 04/18/2022   Normocytic anemia 04/18/2022   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   Coronary artery calcification 03/30/2022   B12 deficiency 08/13/2020   Rheumatoid arthritis (HCC) 08/12/2020   BPH (benign prostatic hyperplasia) 08/12/2020   Folic acid deficiency 08/12/2020   Stage 3a  chronic kidney disease (HCC) 10/17/2019   Heme positive stool 02/01/2019   RBBB 10/03/2018   Depression 12/09/2015   Asthma 08/13/2009   Hyperglycemia 08/13/2009   OSA (obstructive sleep apnea) 01/23/2009   CHRONIC SYSTOLIC HEART FAILURE 09/11/2008   Obesity, unspecified 08/26/2008   Disorder resulting from impaired renal function 08/26/2008   Essential hypertension 06/28/2008   Cardiomyopathy (HCC) 06/28/2008    Orientation RESPIRATION BLADDER Height & Weight     Self, Time, Situation, Place  Normal Continent Weight: 171 lb 15.3 oz (78 kg) Height:  5\' 11"  (180.3 cm)  BEHAVIORAL SYMPTOMS/MOOD NEUROLOGICAL BOWEL NUTRITION STATUS      Continent Diet (See DC summary)  AMBULATORY STATUS COMMUNICATION OF NEEDS Skin   Limited Assist Verbally PU Stage and Appropriate Care (Bilateral buttock wounds)                       Personal Care Assistance Level of Assistance  Bathing, Feeding, Dressing Bathing Assistance: Limited assistance Feeding assistance: Limited assistance Dressing Assistance: Limited assistance Total Care Assistance: Limited assistance   Functional Limitations Info  Sight, Hearing, Speech Sight Info: Impaired Hearing Info: Adequate Speech Info: Adequate    SPECIAL CARE FACTORS FREQUENCY  PT (By licensed PT), OT (By licensed OT)     PT Frequency: 5x week OT Frequency: 5x week            Contractures Contractures Info: Not present    Additional Factors Info  Code Status, Allergies Code Status Info: DNR Allergies Info: Ace Inhibitors, Isosorb Dinitrate- hydralazine           Current Medications (05/09/2023):  This  is the current hospital active medication list Current Facility-Administered Medications  Medication Dose Route Frequency Provider Last Rate Last Admin   acetaminophen (TYLENOL) tablet 650 mg  650 mg Oral Q6H PRN Janalyn Shy, Subrina, MD   650 mg at 05/08/23 2122   Or   acetaminophen (TYLENOL) suppository 650 mg  650 mg Rectal Q6H PRN  Janalyn Shy, Subrina, MD       amiodarone (PACERONE) tablet 200 mg  200 mg Oral Daily Sundil, Subrina, MD   200 mg at 05/09/23 1024   apixaban (ELIQUIS) tablet 10 mg  10 mg Oral BID Lorin Glass, MD   10 mg at 05/09/23 1024   Followed by   Melene Muller ON 05/12/2023] apixaban (ELIQUIS) tablet 5 mg  5 mg Oral BID Dahal, Melina Schools, MD       gabapentin (NEURONTIN) capsule 300 mg  300 mg Oral QHS Sundil, Subrina, MD   300 mg at 05/08/23 2114   ipratropium-albuterol (DUONEB) 0.5-2.5 (3) MG/3ML nebulizer solution 3 mL  3 mL Nebulization Q4H PRN Sundil, Subrina, MD       mometasone-formoterol [facility]) 200-5 MCG/ACT inhaler 2 puff  2 puff Inhalation BID Janalyn Shy, Subrina, MD   2 puff at 05/09/23 1610   Muscle Rub CREA 1 Application  1 Application Topical PRN Lorin Glass, MD   1 Application at 05/08/23 2122   ondansetron (ZOFRAN) tablet 4 mg  4 mg Oral Q6H PRN Janalyn Shy, Subrina, MD   4 mg at 05/08/23 1235   Or   ondansetron (ZOFRAN) injection 4 mg  4 mg Intravenous Q6H PRN Janalyn Shy, Subrina, MD       Oral care mouth rinse  15 mL Mouth Rinse PRN Dahal, Melina Schools, MD       pantoprazole (PROTONIX) EC tablet 40 mg  40 mg Oral Daily Sundil, Subrina, MD   40 mg at 05/09/23 1024   sodium chloride flush (NS) 0.9 % injection 3 mL  3 mL Intravenous Q12H Sundil, Subrina, MD   3 mL at 05/09/23 1025   sodium chloride flush (NS) 0.9 % injection 3 mL  3 mL Intravenous PRN Janalyn Shy, Subrina, MD       torsemide [facility]) tablet 40 mg  40 mg Oral Daily Sundil, Subrina, MD   40 mg at 05/09/23 1024     Discharge Medications: Please see discharge summary for a list of discharge medications.  Relevant Imaging Results:  Relevant Lab Results:   Additional Information SSN: [id number]  Carley Hammed, LCSW

## 2023-05-09 NOTE — Plan of Care (Signed)
 Pt off unit with PTAR at this time.   Problem: Education: Goal: Knowledge of General Education information will improve Description: Including pain rating scale, medication(s)/side effects and non-pharmacologic comfort measures Outcome: Adequate for Discharge   Problem: Health Behavior/Discharge Planning: Goal: Ability to manage health-related needs will improve Outcome: Adequate for Discharge   Problem: Clinical Measurements: Goal: Ability to maintain clinical measurements within normal limits will improve Outcome: Adequate for Discharge Goal: Will remain free from infection Outcome: Adequate for Discharge Goal: Diagnostic test results will improve Outcome: Adequate for Discharge Goal: Respiratory complications will improve Outcome: Adequate for Discharge Goal: Cardiovascular complication will be avoided Outcome: Adequate for Discharge   Problem: Activity: Goal: Risk for activity intolerance will decrease Outcome: Adequate for Discharge   Problem: Nutrition: Goal: Adequate nutrition will be maintained Outcome: Adequate for Discharge   Problem: Coping: Goal: Level of anxiety will decrease Outcome: Adequate for Discharge   Problem: Elimination: Goal: Will not experience complications related to bowel motility Outcome: Adequate for Discharge Goal: Will not experience complications related to urinary retention Outcome: Adequate for Discharge   Problem: Pain Managment: Goal: General experience of comfort will improve and/or be controlled Outcome: Adequate for Discharge   Problem: Safety: Goal: Ability to remain free from injury will improve Outcome: Adequate for Discharge   Problem: Skin Integrity: Goal: Risk for impaired skin integrity will decrease Outcome: Adequate for Discharge

## 2023-05-09 NOTE — Progress Notes (Signed)
 OT Cancellation Note  Patient Details Name: Conrado E [place] MRN: [id number] DOB: 08/28/1940   Cancelled Treatment:    Reason Eval/Treat Not Completed: Other (comment) Pt is Medicare and current D/C plan is SNF. No apparent immediate acute care OT needs, therefore will defer OT to SNF. If OT eval is needed please call Acute Rehab Dept. at [phone] or text page OT at [phone].    Limmie Patricia, OTR/L,CBIS  Supplemental OT - [facility] and [facility] Secure Chat Preferred   05/09/2023, 12:26 PM

## 2023-05-09 NOTE — Discharge Summary (Signed)
 Physician Discharge Summary  Brandon [name] MVH:[id number] DOB: [birth year] DOA: 05/03/2023  PCP: [name], NP  Admit date: 05/03/2023 Discharge date: 05/09/2023  Admitted From: SNF Discharge disposition: Back to SNF  Recommendations at discharge:  Resumed on Eliquis.  Brief narrative: Brandon [name] is a 83 y.o. male with PMH significant for HTN, stroke, OSA not on CPAP, systolic CHF, nonischemic cardiomyopathy, paroxysmal A-fib, pulmonary hypertension was recently hospitalized under cardiology service 1/31 to 2/13 for cardiogenic shock secondary to recurrent A-fib.  He was on Lasix drip, milrinone drip, underwent successful DCCV.  He was deemed not a candidate for advanced therapies given his age, frailty and poor kidney function.  Palliative care was consulted for GOC discussion.  He was made DNR and also subsequently transition to comfort care measures.  He was discharged to nursing facility plan for outpatient palliative care follow-up and transition to residential hospice once appropriate.  Some of the medications including Eliquis were discontinued at discharge.  At the SNF, patient had poor appetite, hydration and weakness.  He also had progressive swelling of his right upper arm. 2/18, patient had an ultrasound duplex of upper extremity and noted to have right arm DVT.  Cardiology service was called.  Cardiology suggested to resume Eliquis at a higher dose of 10 mg twice daily for a week then decrease to 5 mg twice daily.  However it seems the facility still wanted the patient to be transferred to the hospital and hence he was brought to ED at [facility].  In the ED, patient was afebrile, hemodynamically stable BUN/creatinine 59/2.65 ED physician has been consulted and spoken with vascular surgeon Dr. Randie Heinz who recommended to obtain CT chest.  CTA chest showed diffusely dilated, mildly hyperdense, and demonstrates extensive perivascular inflammatory stranding in keeping  with changes of thrombosis and possible superimposed thrombophlebitis. A central obstructing lesion is not clearly identified. The innominate vein is of relatively narrow caliber, however, its patency is not well assessed on this noncontrast examination.  Patient was started on IV heparin drip. Admitted to [facility] Vascular surgery was consulted  Subjective: Patient was seen and examined this morning. Propped up in bed.  Not in distress no new symptoms. Right upper extremity swelling improving.  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  Patient was recently in the hospital and had central lines in place used for milrinone drip, Lasix drip. As he was transitioned to comfort care, Eliquis was held at discharge. Now with new DVT, patient has been started on IV heparin drip as recommended by vascular surgery. Vascular surgery was consulted and recommended no intervention.  Hence switched from heparin drip to Eliquis. Recommended to keep right upper extremity elevated. Palliative care consulted appreciated.  Chronic systolic CHF Nonischemic cardiomyopathy Recent hospitalized for cardiogenic shock. Recent echo from 2/4 with EF 20 to 25%. Most of meds on GDMT were discontinued at discharge. Currently continued on torsemide 40 mg daily only Hemodynamically stable.  Renal function improving. Continue torsemide.  CKD stage 4 Creatinine remains at baseline   Paroxysmal A-fib Continue amiodarone Eliquis as above.    H/o CVA HLD Eliquis as above.  Lipitor was stopped last admission  Type 2 diabetes mellitus A1c 5.1 on 2/4 Diet-controlled   Peripheral neuropathy Currently not on gabapentin   COPD Stable O2 sat 100% room air Continued DuoNeb as needed and Dulera twice daily.   Goals of  care   Code Status: Limited: Do not attempt resuscitation (DNR) -DNR-LIMITED -Do  Not Intubate/DNI     Consultants: Palliative care Family Communication: None at bedside  Diet:  Diet Order             Diet general           DIET DYS 2 Room service appropriate? Yes; Fluid consistency: Thin; Fluid restriction: 2000 mL Fluid  Diet effective now                   Nutritional status:  Body mass index is 23.98 kg/m.       Wounds:  - Wound / Incision (Open or Dehisced) 05/06/23 Other (Comment) Buttocks Right;Left (Active)  Date First Assessed/Time First Assessed: 05/06/23 1200   Wound Type: Other (Comment)  Location: Buttocks  Location Orientation: Right;Left  Present on Admission: Yes    Assessments 05/06/2023  8:00 PM 05/08/2023  9:15 PM  Dressing Type Foam - Lift dressing to assess site every shift Foam - Lift dressing to assess site every shift  Dressing Changed New --  Dressing Status Clean, Dry, Intact Clean, Dry, Intact  Site / Wound Assessment -- Dressing in place / Unable to assess     No associated orders.     Wound / Incision (Open or Dehisced) 05/06/23 Other (Comment) Buttocks Right;Left (Active)  Date First Assessed/Time First Assessed: 05/06/23 1000   Wound Type: (c) Other (Comment)  Location: Buttocks  Location Orientation: Right;Left  Present on Admission: Yes    Assessments 05/07/2023  8:49 PM 05/08/2023  9:15 PM  Dressing Type Foam - Lift dressing to assess site every shift Foam - Lift dressing to assess site every shift  Dressing Status Clean, Dry, Intact Clean, Dry, Intact  Site / Wound Assessment -- Dressing in place / Unable to assess     No associated orders.    Discharge Exam:   Vitals:   05/08/23 1638 05/08/23 2113 05/09/23 0524 05/09/23 0730  BP: 128/81 102/75 104/83 114/74  Pulse: 88 74 76 72  Resp: 18 17 18 17   Temp: 98 F (36.7 C) 98.4 F (36.9 C) 97.9 F (36.6 C) 98.5 F (36.9 C)  TempSrc:  Oral Oral Oral  SpO2: 97% 100% 98% 99%  Weight:      Height:        Body mass index is 23.98 kg/m.  General exam:  Pleasant, elderly African-American male. Skin: No rashes, lesions or ulcers. HEENT: Atraumatic, normocephalic, no obvious bleeding Lungs: Clear to auscultation bilaterally,  CVS: S1, S2, no murmur,   GI/Abd: Soft, nontender, nondistended, bowel sound present,   CNS: Alert, awake, oriented to place and person Psychiatry: Mood appropriate. Extremities: No pedal edema, no calf tenderness.  Right upper extremity swelling improving.  Follow ups:    Follow-up Information     [name], NP Follow up.   Specialty: Internal Medicine Contact information: [address] [place] [address] [phone]                 Discharge Instructions:   Discharge Instructions     Call MD for:  difficulty breathing, headache or visual disturbances   Complete by: As directed    Call MD for:  extreme fatigue   Complete by: As directed    Call MD for:  hives   Complete by: As directed    Call MD for:  persistant dizziness or light-headedness   Complete by: As directed  Call MD for:  persistant nausea and vomiting   Complete by: As directed    Call MD for:  severe uncontrolled pain   Complete by: As directed    Call MD for:  temperature >100.4   Complete by: As directed    Diet general   Complete by: As directed    Discharge instructions   Complete by: As directed    Recommendations at discharge:   Resumed on Eliquis.  General discharge instructions: Follow with Primary MD [name], NP in 7 days  Please request your PCP  to go over your hospital tests, procedures, radiology results at the follow up. Please get your medicines reviewed and adjusted.  Your PCP may decide to repeat certain labs or tests as needed. Do not drive, operate heavy machinery, perform activities at heights, swimming or participation in water activities or provide baby sitting services if your were admitted for syncope or siezures until you have seen by Primary MD or a  Neurologist and advised to do so again. [place] Controlled Substance Reporting System database was reviewed. Do not drive, operate heavy machinery, perform activities at heights, swim, participate in water activities or provide baby-sitting services while on medications for pain, sleep and mood until your outpatient physician has reevaluated you and advised to do so again.  You are strongly recommended to comply with the dose, frequency and duration of prescribed medications. Activity: As tolerated with Full fall precautions use walker/cane & assistance as needed Avoid using any recreational substances like cigarette, tobacco, alcohol, or non-prescribed drug. If you experience worsening of your admission symptoms, develop shortness of breath, life threatening emergency, suicidal or homicidal thoughts you must seek medical attention immediately by calling 911 or calling your MD immediately  if symptoms less severe. You must read complete instructions/literature along with all the possible adverse reactions/side effects for all the medicines you take and that have been prescribed to you. Take any new medicine only after you have completely understood and accepted all the possible adverse reactions/side effects.  Wear Seat belts while driving. You were cared for by a hospitalist during your hospital stay. If you have any questions about your discharge medications or the care you received while you were in the hospital after you are discharged, you can call the unit and ask to speak with the hospitalist or the covering physician. Once you are discharged, your primary care physician will handle any further medical issues. Please note that NO REFILLS for any discharge medications will be authorized once you are discharged, as it is imperative that you return to your primary care physician (or establish a relationship with a primary care physician if you do not have one).   Discharge wound care:   Complete  by: As directed    Increase activity slowly   Complete by: As directed        Discharge Medications:   Allergies as of 05/09/2023       Reactions   Ace Inhibitors Cough   Isosorb Dinitrate-hydralazine Other (See Comments)   dizziness/hypotensive        Medication List     TAKE these medications    acetaminophen 500 MG tablet Commonly known as: TYLENOL Take 500 mg by mouth in the morning, at noon, and at bedtime.   albuterol (2.5 MG/3ML) 0.083% nebulizer solution Commonly known as: PROVENTIL TAKE 3 MLS BY NEBULIZER EVERY 6 HOURS AS NEEDED FOR WHEEZING FOR SHORTNESS OF BREATH   ALPRAZolam 0.25 MG tablet Commonly known  as: XANAX Take 0.25 mg by mouth 3 (three) times daily as needed for anxiety (for 14 days).   amiodarone 200 MG tablet Commonly known as: PACERONE Take 1 tablet (200 mg total) by mouth daily.   apixaban 5 MG Tabs tablet Commonly known as: ELIQUIS Take 2 tablets (10 mg total) by mouth 2 (two) times daily for 3 days, THEN 1 tablet (5 mg total) 2 (two) times daily. Start taking on: May 09, 2023   citalopram 10 MG tablet Commonly known as: CELEXA Take 10 mg by mouth daily.   fluticasone-salmeterol 500-50 MCG/ACT Aepb Commonly known as: ADVAIR Inhale 1 puff into the lungs in the morning and at bedtime.   gabapentin 300 MG capsule Commonly known as: NEURONTIN Take 1 capsule (300 mg total) by mouth at bedtime.   pantoprazole 40 MG tablet Commonly known as: PROTONIX Take 1 tablet (40 mg total) by mouth daily. Start taking on: May 10, 2023   torsemide 20 MG tablet Commonly known as: DEMADEX Take 2 tablets (40 mg total) by mouth daily.               Discharge Care Instructions  (From admission, onward)           Start     Ordered   05/09/23 0000  Discharge wound care:        05/09/23 1147             The results of significant diagnostics from this hospitalization (including imaging, microbiology, ancillary and  laboratory) are listed below for reference.    Procedures and Diagnostic Studies:   CT CHEST WO CONTRAST Result Date: 05/03/2023 CLINICAL DATA:  Central right upper extremity venous obstruction, thoracic mass EXAM: CT CHEST WITHOUT CONTRAST TECHNIQUE: Multidetector CT imaging of the chest was performed following the standard protocol without IV contrast. RADIATION DOSE REDUCTION: This exam was performed according to the departmental dose-optimization program which includes automated exposure control, adjustment of the mA and/or kV according to patient size and/or use of iterative reconstruction technique. COMPARISON:  04/18/2022 FINDINGS: Cardiovascular: Mild multi-vessel coronary artery calcification. Mild global cardiomegaly with left ventricular enlargement. No pericardial effusion. Central pulmonary arteries are enlarged in keeping with changes of pulmonary arterial hypertension. Mild atherosclerotic calcification within thoracic aorta. There is fusiform dilation of the descending thoracic aorta measuring 3.5 cm in diameter proximally and 3.3 cm in diameter distally at the level the left atrium. The ascending aorta is of normal caliber. The right subclavian vein is diffusely dilated, mildly hyperdense, and demonstrates extensive perivascular inflammatory stranding in keeping with changes of thrombosis and possible superimposed thrombophlebitis. The innominate vein is of relatively narrow caliber, however, its patency is not well assessed on this noncontrast examination. Mediastinum/Nodes: Heterogeneous enhancement of the left thyroid lobe appears similar to prior examination is nonspecific. A discrete nodule is not clearly identified. No pathologic thoracic adenopathy. The esophagus is unremarkable. Lungs/Pleura: The lungs are symmetrically hyperinflated in keeping with changes of underlying COPD. Bibasilar parenchymal scarring. Lungs are otherwise clear. No pneumothorax or pleural effusion. Upper Abdomen:  No acute abnormality. Musculoskeletal: No chest wall mass or suspicious bone lesions identified. IMPRESSION: 1. Diffusely dilated, mildly hyperdense, and demonstrates extensive perivascular inflammatory stranding in keeping with changes of thrombosis and possible superimposed thrombophlebitis. A central obstructing lesion is not clearly identified. The innominate vein is of relatively narrow caliber, however, its patency is not well assessed on this noncontrast examination. If there is a question of chronic thrombosis of the innominate vein, patency of  this vessel would better assessed with contrast enhanced CT venography. 2. Mild multi-vessel coronary artery calcification. Mild global cardiomegaly with left ventricular enlargement. 3. Morphologic changes in keeping with pulmonary arterial hypertension. 4. COPD. Aortic Atherosclerosis (ICD10-I70.0). Electronically Signed   By: Helyn Numbers M.D.   On: 05/03/2023 23:57     Labs:   Basic Metabolic Panel: Recent Labs  Lab 05/03/23 2146 05/04/23 0520  NA 138 135  K 4.4 4.1  CL 100 102  CO2 25 22  GLUCOSE 108* 107*  BUN 59* 60*  CREATININE 2.65* 2.80*  CALCIUM 9.9 9.8   GFR Estimated Creatinine Clearance: 21.7 mL/min (A) (by C-G formula based on SCr of 2.8 mg/dL (H)). Liver Function Tests: Recent Labs  Lab 05/03/23 2146 05/04/23 0520  AST 23 20  ALT 31 29  ALKPHOS 70 64  BILITOT 1.8* 1.8*  PROT 6.0* 5.6*  ALBUMIN 2.6* 2.4*   No results for input(s): "LIPASE", "AMYLASE" in the last 168 hours. No results for input(s): "AMMONIA" in the last 168 hours. Coagulation profile Recent Labs  Lab 05/03/23 2146  INR 2.6*    CBC: Recent Labs  Lab 05/03/23 2146 05/04/23 0520  WBC 10.1 8.7  NEUTROABS 8.8*  --   HGB 13.7 12.5*  HCT 42.1 37.5*  MCV 96.8 94.5  PLT 189 198   Cardiac Enzymes: No results for input(s): "CKTOTAL", "CKMB", "CKMBINDEX", "TROPONINI" in the last 168 hours. BNP: Invalid input(s): "POCBNP" CBG: Recent Labs   Lab 05/07/23 1136 05/07/23 1618 05/07/23 2116 05/08/23 0841 05/08/23 1638  GLUCAP 97 105* 92 112* 116*   D-Dimer No results for input(s): "DDIMER" in the last 72 hours. Hgb A1c No results for input(s): "HGBA1C" in the last 72 hours. Lipid Profile No results for input(s): "CHOL", "HDL", "LDLCALC", "TRIG", "CHOLHDL", "LDLDIRECT" in the last 72 hours. Thyroid function studies No results for input(s): "TSH", "T4TOTAL", "T3FREE", "THYROIDAB" in the last 72 hours.  Invalid input(s): "FREET3" Anemia work up No results for input(s): "VITAMINB12", "FOLATE", "FERRITIN", "TIBC", "IRON", "RETICCTPCT" in the last 72 hours. Microbiology No results found for this or any previous visit (from the past 240 hours).  Time coordinating discharge: 45 minutes  Signed: Ashvik Grundman  [facility] 05/09/2023, 11:47 AM

## 2023-05-09 NOTE — TOC Transition Note (Addendum)
 Transition of Care [facility]) - Discharge Note   Patient Details  Name: Brandon [name] MRN: [id number] Date of Birth: Sep 22, 1940  Transition of [facility]) CM/SW Contact:  Carley Hammed, LCSW Phone Number: 05/09/2023, 12:20 PM   Clinical Narrative:    Pt to be transported to [facility] via [facility]. Nurse to call report to [phone]. Rm# [id number]   Final next level of care: Skilled Nursing Facility Barriers to Discharge: Barriers Resolved   Patient Goals and CMS Choice            Discharge Placement              Patient chooses bed at: [facility] Patient to be transferred to facility by: [facility] Name of family member notified: Daughter, [name] Patient and family notified of of transfer: 05/09/23  Discharge Plan and Services Additional resources added to the After Visit Summary for                                       Social Drivers of Health (SDOH) Interventions SDOH Screenings   Food Insecurity: No Food Insecurity (05/04/2023)  Housing: Low Risk  (05/04/2023)  Transportation Needs: No Transportation Needs (05/04/2023)  Utilities: Not At Risk (05/04/2023)  Alcohol Screen: Low Risk  (07/01/2022)  Depression (PHQ2-9): Low Risk  (02/03/2023)  Financial Resource Strain: Low Risk  (01/13/2023)  Physical Activity: Insufficiently Active (07/25/2020)  Social Connections: Socially Integrated (05/04/2023)  Stress: No Stress Concern Present (05/28/2021)  Tobacco Use: Medium Risk (05/03/2023)  Health Literacy: Adequate Health Literacy (01/27/2023)     Readmission Risk Interventions     No data to display

## 2023-05-10 LAB — ECHO TEE: Est EF: <20

## 2023-05-13 DIAGNOSIS — N184 Chronic kidney disease, stage 4 (severe): Secondary | ICD-10-CM | POA: Diagnosis not present

## 2023-05-13 DIAGNOSIS — I480 Paroxysmal atrial fibrillation: Secondary | ICD-10-CM | POA: Diagnosis not present

## 2023-05-13 DIAGNOSIS — I714 Abdominal aortic aneurysm, without rupture, unspecified: Secondary | ICD-10-CM | POA: Diagnosis not present

## 2023-05-13 DIAGNOSIS — I5022 Chronic systolic (congestive) heart failure: Secondary | ICD-10-CM | POA: Diagnosis not present

## 2023-05-13 DIAGNOSIS — J45909 Unspecified asthma, uncomplicated: Secondary | ICD-10-CM | POA: Diagnosis not present

## 2023-05-13 DIAGNOSIS — R5381 Other malaise: Secondary | ICD-10-CM | POA: Diagnosis not present

## 2023-05-13 DIAGNOSIS — I130 Hypertensive heart and chronic kidney disease with heart failure and stage 1 through stage 4 chronic kidney disease, or unspecified chronic kidney disease: Secondary | ICD-10-CM | POA: Diagnosis not present

## 2023-05-13 DIAGNOSIS — Z8673 Personal history of transient ischemic attack (TIA), and cerebral infarction without residual deficits: Secondary | ICD-10-CM | POA: Diagnosis not present

## 2023-05-13 DIAGNOSIS — I429 Cardiomyopathy, unspecified: Secondary | ICD-10-CM | POA: Diagnosis not present

## 2023-05-13 DIAGNOSIS — J4489 Other specified chronic obstructive pulmonary disease: Secondary | ICD-10-CM | POA: Diagnosis not present

## 2023-05-13 DIAGNOSIS — I272 Pulmonary hypertension, unspecified: Secondary | ICD-10-CM | POA: Diagnosis not present

## 2023-05-13 DIAGNOSIS — D631 Anemia in chronic kidney disease: Secondary | ICD-10-CM | POA: Diagnosis not present

## 2023-05-16 ENCOUNTER — Ambulatory Visit: Payer: Self-pay

## 2023-05-16 NOTE — Patient Outreach (Signed)
 Care Coordination   Follow Up Visit Note   05/16/2023 Name: Brandon [name] MRN: [id number] DOB: 28-Jul-1940  Brandon [name] is a 83 y.o. year old male who sees [name], NP for primary care. I spoke with  Brandon [name] by phone today.  What matters to the patients health and wellness today?  [name] reports he is still in SNF for rehab. He reports he continues to work with home health PT/OT with plans to return to home in about 2 weeks.   Goals Addressed             This Visit's Progress    Assist with health management       Interventions Today    Flowsheet Row Most Recent Value  General Interventions   General Interventions Discussed/Reviewed General Interventions Reviewed  [facility] called to assess level of care-confimed remains in SNF for rehab.]            SDOH assessments and interventions completed:  No  Care Coordination Interventions:  Yes, provided   Follow up plan:  Continue to follow. Care guide to call to schedule follow up.    Encounter Outcome:  Patient Visit Completed   [name], RN, MSN, BSN, CCM [place]  [facility], Population Health Case Manager Phone: [phone]

## 2023-05-20 DIAGNOSIS — N189 Chronic kidney disease, unspecified: Secondary | ICD-10-CM | POA: Diagnosis not present

## 2023-05-20 DIAGNOSIS — I480 Paroxysmal atrial fibrillation: Secondary | ICD-10-CM | POA: Diagnosis not present

## 2023-05-20 DIAGNOSIS — I82B11 Acute embolism and thrombosis of right subclavian vein: Secondary | ICD-10-CM | POA: Diagnosis not present

## 2023-05-20 DIAGNOSIS — Z8673 Personal history of transient ischemic attack (TIA), and cerebral infarction without residual deficits: Secondary | ICD-10-CM | POA: Diagnosis not present

## 2023-05-20 DIAGNOSIS — I5022 Chronic systolic (congestive) heart failure: Secondary | ICD-10-CM | POA: Diagnosis not present

## 2023-05-20 DIAGNOSIS — Z7901 Long term (current) use of anticoagulants: Secondary | ICD-10-CM | POA: Diagnosis not present

## 2023-05-20 DIAGNOSIS — D631 Anemia in chronic kidney disease: Secondary | ICD-10-CM | POA: Diagnosis not present

## 2023-05-20 DIAGNOSIS — I130 Hypertensive heart and chronic kidney disease with heart failure and stage 1 through stage 4 chronic kidney disease, or unspecified chronic kidney disease: Secondary | ICD-10-CM | POA: Diagnosis not present

## 2023-05-23 DIAGNOSIS — J441 Chronic obstructive pulmonary disease with (acute) exacerbation: Secondary | ICD-10-CM | POA: Diagnosis not present

## 2023-05-23 DIAGNOSIS — I82A11 Acute embolism and thrombosis of right axillary vein: Secondary | ICD-10-CM | POA: Diagnosis not present

## 2023-05-23 DIAGNOSIS — I5084 End stage heart failure: Secondary | ICD-10-CM | POA: Diagnosis not present

## 2023-05-23 DIAGNOSIS — I4811 Longstanding persistent atrial fibrillation: Secondary | ICD-10-CM | POA: Diagnosis not present

## 2023-05-24 ENCOUNTER — Telehealth: Payer: Self-pay | Admitting: Neurology

## 2023-05-24 NOTE — Telephone Encounter (Signed)
 Copied from CRM [id number]. Topic: Referral - Question >> May 24, 2023 10:14 AM Bo Mcclintock wrote: Reason for CRM: [facility] is requesting an order to start Palliative care for the pt  Callback# Drenda Freeze [phone]

## 2023-05-25 NOTE — Telephone Encounter (Signed)
 Orders provided over the telephone

## 2023-05-27 ENCOUNTER — Telehealth: Payer: Self-pay

## 2023-05-27 DIAGNOSIS — M6281 Muscle weakness (generalized): Secondary | ICD-10-CM | POA: Diagnosis not present

## 2023-05-27 DIAGNOSIS — Z7901 Long term (current) use of anticoagulants: Secondary | ICD-10-CM | POA: Diagnosis not present

## 2023-05-27 DIAGNOSIS — I5020 Unspecified systolic (congestive) heart failure: Secondary | ICD-10-CM | POA: Diagnosis not present

## 2023-05-27 DIAGNOSIS — G4733 Obstructive sleep apnea (adult) (pediatric): Secondary | ICD-10-CM | POA: Diagnosis not present

## 2023-05-27 DIAGNOSIS — I130 Hypertensive heart and chronic kidney disease with heart failure and stage 1 through stage 4 chronic kidney disease, or unspecified chronic kidney disease: Secondary | ICD-10-CM | POA: Diagnosis not present

## 2023-05-27 DIAGNOSIS — I429 Cardiomyopathy, unspecified: Secondary | ICD-10-CM | POA: Diagnosis not present

## 2023-05-27 DIAGNOSIS — Z87891 Personal history of nicotine dependence: Secondary | ICD-10-CM | POA: Diagnosis not present

## 2023-05-27 DIAGNOSIS — D631 Anemia in chronic kidney disease: Secondary | ICD-10-CM | POA: Diagnosis not present

## 2023-05-27 DIAGNOSIS — J45909 Unspecified asthma, uncomplicated: Secondary | ICD-10-CM | POA: Diagnosis not present

## 2023-05-27 DIAGNOSIS — N184 Chronic kidney disease, stage 4 (severe): Secondary | ICD-10-CM | POA: Diagnosis not present

## 2023-05-27 DIAGNOSIS — I480 Paroxysmal atrial fibrillation: Secondary | ICD-10-CM | POA: Diagnosis not present

## 2023-05-27 DIAGNOSIS — I272 Pulmonary hypertension, unspecified: Secondary | ICD-10-CM | POA: Diagnosis not present

## 2023-05-27 DIAGNOSIS — Z8673 Personal history of transient ischemic attack (TIA), and cerebral infarction without residual deficits: Secondary | ICD-10-CM | POA: Diagnosis not present

## 2023-05-27 DIAGNOSIS — R5381 Other malaise: Secondary | ICD-10-CM | POA: Diagnosis not present

## 2023-05-27 DIAGNOSIS — J449 Chronic obstructive pulmonary disease, unspecified: Secondary | ICD-10-CM | POA: Diagnosis not present

## 2023-05-27 DIAGNOSIS — I714 Abdominal aortic aneurysm, without rupture, unspecified: Secondary | ICD-10-CM | POA: Diagnosis not present

## 2023-05-27 NOTE — Telephone Encounter (Signed)
 Copied from CRM [id number]. Topic: Clinical - Home Health Verbal Orders >> May 27, 2023  3:56 PM Almira Coaster wrote: Caller/Agency: Aurelio Jew / [facility] Callback Number: [phone] Service Requested: Physical Therapy Frequency: twice a week for 2 weeks and once a week for 6 weeks Any new concerns about the patient? No,, but also requesting a nursing eval.

## 2023-05-30 DIAGNOSIS — G4733 Obstructive sleep apnea (adult) (pediatric): Secondary | ICD-10-CM | POA: Diagnosis not present

## 2023-05-30 DIAGNOSIS — I130 Hypertensive heart and chronic kidney disease with heart failure and stage 1 through stage 4 chronic kidney disease, or unspecified chronic kidney disease: Secondary | ICD-10-CM | POA: Diagnosis not present

## 2023-05-30 DIAGNOSIS — J45909 Unspecified asthma, uncomplicated: Secondary | ICD-10-CM | POA: Diagnosis not present

## 2023-05-30 DIAGNOSIS — R5381 Other malaise: Secondary | ICD-10-CM | POA: Diagnosis not present

## 2023-05-30 DIAGNOSIS — I429 Cardiomyopathy, unspecified: Secondary | ICD-10-CM | POA: Diagnosis not present

## 2023-05-30 DIAGNOSIS — Z8673 Personal history of transient ischemic attack (TIA), and cerebral infarction without residual deficits: Secondary | ICD-10-CM | POA: Diagnosis not present

## 2023-05-30 DIAGNOSIS — D631 Anemia in chronic kidney disease: Secondary | ICD-10-CM | POA: Diagnosis not present

## 2023-05-30 DIAGNOSIS — I714 Abdominal aortic aneurysm, without rupture, unspecified: Secondary | ICD-10-CM | POA: Diagnosis not present

## 2023-05-30 DIAGNOSIS — Z7901 Long term (current) use of anticoagulants: Secondary | ICD-10-CM | POA: Diagnosis not present

## 2023-05-30 DIAGNOSIS — N184 Chronic kidney disease, stage 4 (severe): Secondary | ICD-10-CM | POA: Diagnosis not present

## 2023-05-30 DIAGNOSIS — I5020 Unspecified systolic (congestive) heart failure: Secondary | ICD-10-CM | POA: Diagnosis not present

## 2023-05-30 DIAGNOSIS — I272 Pulmonary hypertension, unspecified: Secondary | ICD-10-CM | POA: Diagnosis not present

## 2023-05-30 DIAGNOSIS — I480 Paroxysmal atrial fibrillation: Secondary | ICD-10-CM | POA: Diagnosis not present

## 2023-05-30 DIAGNOSIS — M6281 Muscle weakness (generalized): Secondary | ICD-10-CM | POA: Diagnosis not present

## 2023-05-30 DIAGNOSIS — J449 Chronic obstructive pulmonary disease, unspecified: Secondary | ICD-10-CM | POA: Diagnosis not present

## 2023-05-30 DIAGNOSIS — Z87891 Personal history of nicotine dependence: Secondary | ICD-10-CM | POA: Diagnosis not present

## 2023-05-31 DIAGNOSIS — I272 Pulmonary hypertension, unspecified: Secondary | ICD-10-CM | POA: Diagnosis not present

## 2023-05-31 DIAGNOSIS — I130 Hypertensive heart and chronic kidney disease with heart failure and stage 1 through stage 4 chronic kidney disease, or unspecified chronic kidney disease: Secondary | ICD-10-CM | POA: Diagnosis not present

## 2023-05-31 DIAGNOSIS — I429 Cardiomyopathy, unspecified: Secondary | ICD-10-CM | POA: Diagnosis not present

## 2023-05-31 DIAGNOSIS — R5381 Other malaise: Secondary | ICD-10-CM | POA: Diagnosis not present

## 2023-05-31 DIAGNOSIS — I714 Abdominal aortic aneurysm, without rupture, unspecified: Secondary | ICD-10-CM | POA: Diagnosis not present

## 2023-05-31 DIAGNOSIS — J45909 Unspecified asthma, uncomplicated: Secondary | ICD-10-CM | POA: Diagnosis not present

## 2023-05-31 DIAGNOSIS — Z7901 Long term (current) use of anticoagulants: Secondary | ICD-10-CM | POA: Diagnosis not present

## 2023-05-31 DIAGNOSIS — I480 Paroxysmal atrial fibrillation: Secondary | ICD-10-CM | POA: Diagnosis not present

## 2023-05-31 DIAGNOSIS — Z87891 Personal history of nicotine dependence: Secondary | ICD-10-CM | POA: Diagnosis not present

## 2023-05-31 DIAGNOSIS — G4733 Obstructive sleep apnea (adult) (pediatric): Secondary | ICD-10-CM | POA: Diagnosis not present

## 2023-05-31 DIAGNOSIS — N184 Chronic kidney disease, stage 4 (severe): Secondary | ICD-10-CM | POA: Diagnosis not present

## 2023-05-31 DIAGNOSIS — I5020 Unspecified systolic (congestive) heart failure: Secondary | ICD-10-CM | POA: Diagnosis not present

## 2023-05-31 DIAGNOSIS — D631 Anemia in chronic kidney disease: Secondary | ICD-10-CM | POA: Diagnosis not present

## 2023-05-31 DIAGNOSIS — Z8673 Personal history of transient ischemic attack (TIA), and cerebral infarction without residual deficits: Secondary | ICD-10-CM | POA: Diagnosis not present

## 2023-05-31 DIAGNOSIS — J449 Chronic obstructive pulmonary disease, unspecified: Secondary | ICD-10-CM | POA: Diagnosis not present

## 2023-05-31 DIAGNOSIS — M6281 Muscle weakness (generalized): Secondary | ICD-10-CM | POA: Diagnosis not present

## 2023-05-31 NOTE — Telephone Encounter (Signed)
 Verbal orders given to Vernona Rieger for pt and nursing evaluation

## 2023-06-02 DIAGNOSIS — D631 Anemia in chronic kidney disease: Secondary | ICD-10-CM | POA: Diagnosis not present

## 2023-06-02 DIAGNOSIS — R5381 Other malaise: Secondary | ICD-10-CM | POA: Diagnosis not present

## 2023-06-02 DIAGNOSIS — J45909 Unspecified asthma, uncomplicated: Secondary | ICD-10-CM | POA: Diagnosis not present

## 2023-06-02 DIAGNOSIS — N184 Chronic kidney disease, stage 4 (severe): Secondary | ICD-10-CM | POA: Diagnosis not present

## 2023-06-02 DIAGNOSIS — Z7901 Long term (current) use of anticoagulants: Secondary | ICD-10-CM | POA: Diagnosis not present

## 2023-06-02 DIAGNOSIS — Z87891 Personal history of nicotine dependence: Secondary | ICD-10-CM | POA: Diagnosis not present

## 2023-06-02 DIAGNOSIS — Z8673 Personal history of transient ischemic attack (TIA), and cerebral infarction without residual deficits: Secondary | ICD-10-CM | POA: Diagnosis not present

## 2023-06-02 DIAGNOSIS — I714 Abdominal aortic aneurysm, without rupture, unspecified: Secondary | ICD-10-CM | POA: Diagnosis not present

## 2023-06-02 DIAGNOSIS — M6281 Muscle weakness (generalized): Secondary | ICD-10-CM | POA: Diagnosis not present

## 2023-06-02 DIAGNOSIS — J449 Chronic obstructive pulmonary disease, unspecified: Secondary | ICD-10-CM | POA: Diagnosis not present

## 2023-06-02 DIAGNOSIS — G4733 Obstructive sleep apnea (adult) (pediatric): Secondary | ICD-10-CM | POA: Diagnosis not present

## 2023-06-02 DIAGNOSIS — I429 Cardiomyopathy, unspecified: Secondary | ICD-10-CM | POA: Diagnosis not present

## 2023-06-02 DIAGNOSIS — I5020 Unspecified systolic (congestive) heart failure: Secondary | ICD-10-CM | POA: Diagnosis not present

## 2023-06-02 DIAGNOSIS — I272 Pulmonary hypertension, unspecified: Secondary | ICD-10-CM | POA: Diagnosis not present

## 2023-06-02 DIAGNOSIS — I130 Hypertensive heart and chronic kidney disease with heart failure and stage 1 through stage 4 chronic kidney disease, or unspecified chronic kidney disease: Secondary | ICD-10-CM | POA: Diagnosis not present

## 2023-06-02 DIAGNOSIS — I480 Paroxysmal atrial fibrillation: Secondary | ICD-10-CM | POA: Diagnosis not present

## 2023-06-03 ENCOUNTER — Telehealth: Payer: Self-pay | Admitting: Emergency Medicine

## 2023-06-03 DIAGNOSIS — N184 Chronic kidney disease, stage 4 (severe): Secondary | ICD-10-CM | POA: Diagnosis not present

## 2023-06-03 DIAGNOSIS — Z7901 Long term (current) use of anticoagulants: Secondary | ICD-10-CM | POA: Diagnosis not present

## 2023-06-03 DIAGNOSIS — J449 Chronic obstructive pulmonary disease, unspecified: Secondary | ICD-10-CM | POA: Diagnosis not present

## 2023-06-03 DIAGNOSIS — J45909 Unspecified asthma, uncomplicated: Secondary | ICD-10-CM | POA: Diagnosis not present

## 2023-06-03 DIAGNOSIS — I480 Paroxysmal atrial fibrillation: Secondary | ICD-10-CM | POA: Diagnosis not present

## 2023-06-03 DIAGNOSIS — I429 Cardiomyopathy, unspecified: Secondary | ICD-10-CM | POA: Diagnosis not present

## 2023-06-03 DIAGNOSIS — R5381 Other malaise: Secondary | ICD-10-CM | POA: Diagnosis not present

## 2023-06-03 DIAGNOSIS — G4733 Obstructive sleep apnea (adult) (pediatric): Secondary | ICD-10-CM | POA: Diagnosis not present

## 2023-06-03 DIAGNOSIS — D631 Anemia in chronic kidney disease: Secondary | ICD-10-CM | POA: Diagnosis not present

## 2023-06-03 DIAGNOSIS — I714 Abdominal aortic aneurysm, without rupture, unspecified: Secondary | ICD-10-CM | POA: Diagnosis not present

## 2023-06-03 DIAGNOSIS — Z87891 Personal history of nicotine dependence: Secondary | ICD-10-CM | POA: Diagnosis not present

## 2023-06-03 DIAGNOSIS — M6281 Muscle weakness (generalized): Secondary | ICD-10-CM | POA: Diagnosis not present

## 2023-06-03 DIAGNOSIS — I272 Pulmonary hypertension, unspecified: Secondary | ICD-10-CM | POA: Diagnosis not present

## 2023-06-03 DIAGNOSIS — I5020 Unspecified systolic (congestive) heart failure: Secondary | ICD-10-CM | POA: Diagnosis not present

## 2023-06-03 DIAGNOSIS — Z8673 Personal history of transient ischemic attack (TIA), and cerebral infarction without residual deficits: Secondary | ICD-10-CM | POA: Diagnosis not present

## 2023-06-03 DIAGNOSIS — I130 Hypertensive heart and chronic kidney disease with heart failure and stage 1 through stage 4 chronic kidney disease, or unspecified chronic kidney disease: Secondary | ICD-10-CM | POA: Diagnosis not present

## 2023-06-03 DIAGNOSIS — I5023 Acute on chronic systolic (congestive) heart failure: Secondary | ICD-10-CM

## 2023-06-03 NOTE — Telephone Encounter (Signed)
 Copied from CRM [id number]. Topic: General - Other >> Jun 03, 2023 11:24 AM Almira Coaster wrote: Reason for CRM: Patient's spouse is calling because they're trying to get patient on Mom's meals through [employer] but they were informed that the request needed to come from the primary care providers office. He is currently staying at his daughter house so meals should be delivered there. [address] [place] [address].

## 2023-06-03 NOTE — Addendum Note (Signed)
 Addended by: [name] on: 06/03/2023 03:57 PM   Modules accepted: Orders

## 2023-06-06 ENCOUNTER — Telehealth: Payer: Self-pay | Admitting: Family

## 2023-06-06 NOTE — Telephone Encounter (Signed)
 Copied from CRM [id number]. Topic: Medicare AWV >> Jun 06, 2023 11:12 AM Payton Doughty wrote: Reason for CRM: Called LVM 06/06/2023 to schedule AWV. Please schedule Virtual or Telehealth visits ONLY.   Verlee Rossetti; Care Guide Ambulatory Clinical Support [facility] Direct Dial: [phone]

## 2023-06-07 ENCOUNTER — Telehealth: Payer: Self-pay

## 2023-06-07 ENCOUNTER — Ambulatory Visit: Admitting: Family

## 2023-06-07 ENCOUNTER — Telehealth: Payer: Self-pay | Admitting: Family

## 2023-06-07 NOTE — Progress Notes (Signed)
 Complex Care Management Note  Care Guide Note 06/07/2023 Name: Brandon [name] MRN: [id number] DOB: December 07, 1940  Brandon [name] is a 83 y.o. year old male who sees [name], NP for primary care. I reached out to [name] by phone today to offer complex care management services.  [name] was given information about Complex Care Management services today including:   The Complex Care Management services include support from the care team which includes your Nurse Care Manager, Clinical Social Worker, or Pharmacist.  The Complex Care Management team is here to help remove barriers to the health concerns and goals most important to you. Complex Care Management services are voluntary, and the patient may decline or stop services at any time by request to their care team member.   Complex Care Management Consent Status: Patient agreed to services and verbal consent obtained.   Follow up plan:  Telephone appointment with complex care management team member scheduled for:  06/14/23 at 2:30 p.m.   Encounter Outcome:  Patient Scheduled  [facility]  [facility], [facility] Care Management Assistant Direct Dial: [phone]  Fax: [phone]

## 2023-06-07 NOTE — Telephone Encounter (Signed)
 Verbal orders left on voice mail ok per provider to precede with OT

## 2023-06-07 NOTE — Telephone Encounter (Signed)
 Copied from CRM [id number]. Topic: Clinical - Home Health Verbal Orders >> Jun 07, 2023  9:49 AM Mayer Masker wrote: Caller/Agency: Allayne Gitelman Callback Number: [phone] Service Requested: Occupational Therapy Frequency: 1 times a week for seven  Any new concerns about the patient? No  Patient had a fall 05/30/23

## 2023-06-08 DIAGNOSIS — I272 Pulmonary hypertension, unspecified: Secondary | ICD-10-CM | POA: Diagnosis not present

## 2023-06-08 DIAGNOSIS — I4891 Unspecified atrial fibrillation: Secondary | ICD-10-CM | POA: Diagnosis not present

## 2023-06-08 DIAGNOSIS — I130 Hypertensive heart and chronic kidney disease with heart failure and stage 1 through stage 4 chronic kidney disease, or unspecified chronic kidney disease: Secondary | ICD-10-CM | POA: Diagnosis not present

## 2023-06-08 DIAGNOSIS — I480 Paroxysmal atrial fibrillation: Secondary | ICD-10-CM | POA: Diagnosis not present

## 2023-06-08 DIAGNOSIS — I5022 Chronic systolic (congestive) heart failure: Secondary | ICD-10-CM | POA: Diagnosis not present

## 2023-06-08 DIAGNOSIS — M6281 Muscle weakness (generalized): Secondary | ICD-10-CM | POA: Diagnosis not present

## 2023-06-08 DIAGNOSIS — I429 Cardiomyopathy, unspecified: Secondary | ICD-10-CM | POA: Diagnosis not present

## 2023-06-08 DIAGNOSIS — D631 Anemia in chronic kidney disease: Secondary | ICD-10-CM | POA: Diagnosis not present

## 2023-06-08 DIAGNOSIS — G4733 Obstructive sleep apnea (adult) (pediatric): Secondary | ICD-10-CM | POA: Diagnosis not present

## 2023-06-08 DIAGNOSIS — I714 Abdominal aortic aneurysm, without rupture, unspecified: Secondary | ICD-10-CM | POA: Diagnosis not present

## 2023-06-08 DIAGNOSIS — R5381 Other malaise: Secondary | ICD-10-CM | POA: Diagnosis not present

## 2023-06-08 DIAGNOSIS — J449 Chronic obstructive pulmonary disease, unspecified: Secondary | ICD-10-CM | POA: Diagnosis not present

## 2023-06-08 DIAGNOSIS — J45909 Unspecified asthma, uncomplicated: Secondary | ICD-10-CM | POA: Diagnosis not present

## 2023-06-08 DIAGNOSIS — Z7901 Long term (current) use of anticoagulants: Secondary | ICD-10-CM | POA: Diagnosis not present

## 2023-06-08 DIAGNOSIS — I5020 Unspecified systolic (congestive) heart failure: Secondary | ICD-10-CM | POA: Diagnosis not present

## 2023-06-08 DIAGNOSIS — N184 Chronic kidney disease, stage 4 (severe): Secondary | ICD-10-CM | POA: Diagnosis not present

## 2023-06-08 DIAGNOSIS — Z8673 Personal history of transient ischemic attack (TIA), and cerebral infarction without residual deficits: Secondary | ICD-10-CM | POA: Diagnosis not present

## 2023-06-08 DIAGNOSIS — Z87891 Personal history of nicotine dependence: Secondary | ICD-10-CM | POA: Diagnosis not present

## 2023-06-09 ENCOUNTER — Telehealth: Payer: Self-pay | Admitting: Family

## 2023-06-09 ENCOUNTER — Ambulatory Visit (INDEPENDENT_AMBULATORY_CARE_PROVIDER_SITE_OTHER): Admitting: Family

## 2023-06-09 VITALS — BP 121/70 | HR 87 | Temp 99.2°F | Resp 16 | Ht 71.0 in | Wt 172.0 lb

## 2023-06-09 DIAGNOSIS — J45909 Unspecified asthma, uncomplicated: Secondary | ICD-10-CM

## 2023-06-09 DIAGNOSIS — R5381 Other malaise: Secondary | ICD-10-CM | POA: Diagnosis not present

## 2023-06-09 DIAGNOSIS — Z87891 Personal history of nicotine dependence: Secondary | ICD-10-CM | POA: Diagnosis not present

## 2023-06-09 DIAGNOSIS — G629 Polyneuropathy, unspecified: Secondary | ICD-10-CM | POA: Diagnosis not present

## 2023-06-09 DIAGNOSIS — E538 Deficiency of other specified B group vitamins: Secondary | ICD-10-CM | POA: Diagnosis not present

## 2023-06-09 DIAGNOSIS — M069 Rheumatoid arthritis, unspecified: Secondary | ICD-10-CM | POA: Diagnosis not present

## 2023-06-09 DIAGNOSIS — I714 Abdominal aortic aneurysm, without rupture, unspecified: Secondary | ICD-10-CM | POA: Diagnosis not present

## 2023-06-09 DIAGNOSIS — D631 Anemia in chronic kidney disease: Secondary | ICD-10-CM | POA: Diagnosis not present

## 2023-06-09 DIAGNOSIS — I639 Cerebral infarction, unspecified: Secondary | ICD-10-CM | POA: Diagnosis not present

## 2023-06-09 DIAGNOSIS — I130 Hypertensive heart and chronic kidney disease with heart failure and stage 1 through stage 4 chronic kidney disease, or unspecified chronic kidney disease: Secondary | ICD-10-CM | POA: Diagnosis not present

## 2023-06-09 DIAGNOSIS — G4733 Obstructive sleep apnea (adult) (pediatric): Secondary | ICD-10-CM | POA: Diagnosis not present

## 2023-06-09 DIAGNOSIS — I82621 Acute embolism and thrombosis of deep veins of right upper extremity: Secondary | ICD-10-CM | POA: Diagnosis not present

## 2023-06-09 DIAGNOSIS — I480 Paroxysmal atrial fibrillation: Secondary | ICD-10-CM | POA: Diagnosis not present

## 2023-06-09 DIAGNOSIS — I5020 Unspecified systolic (congestive) heart failure: Secondary | ICD-10-CM | POA: Diagnosis not present

## 2023-06-09 DIAGNOSIS — F32A Depression, unspecified: Secondary | ICD-10-CM

## 2023-06-09 DIAGNOSIS — N1831 Chronic kidney disease, stage 3a: Secondary | ICD-10-CM

## 2023-06-09 DIAGNOSIS — Z8673 Personal history of transient ischemic attack (TIA), and cerebral infarction without residual deficits: Secondary | ICD-10-CM | POA: Diagnosis not present

## 2023-06-09 DIAGNOSIS — I272 Pulmonary hypertension, unspecified: Secondary | ICD-10-CM | POA: Diagnosis not present

## 2023-06-09 DIAGNOSIS — I429 Cardiomyopathy, unspecified: Secondary | ICD-10-CM | POA: Diagnosis not present

## 2023-06-09 DIAGNOSIS — Z7901 Long term (current) use of anticoagulants: Secondary | ICD-10-CM | POA: Diagnosis not present

## 2023-06-09 DIAGNOSIS — E785 Hyperlipidemia, unspecified: Secondary | ICD-10-CM

## 2023-06-09 DIAGNOSIS — M6281 Muscle weakness (generalized): Secondary | ICD-10-CM | POA: Diagnosis not present

## 2023-06-09 DIAGNOSIS — N184 Chronic kidney disease, stage 4 (severe): Secondary | ICD-10-CM | POA: Diagnosis not present

## 2023-06-09 DIAGNOSIS — J449 Chronic obstructive pulmonary disease, unspecified: Secondary | ICD-10-CM | POA: Diagnosis not present

## 2023-06-09 MED ORDER — FLUTICASONE-SALMETEROL 100-50 MCG/ACT IN AEPB
1.0000 | INHALATION_SPRAY | Freq: Two times a day (BID) | RESPIRATORY_TRACT | 3 refills | Status: DC
Start: 2023-06-09 — End: 2023-07-11

## 2023-06-09 MED ORDER — ROSUVASTATIN CALCIUM 10 MG PO TABS: 10.0000 mg | ORAL_TABLET | Freq: Every day | ORAL | 3 refills | Status: AC

## 2023-06-09 MED ORDER — LEFLUNOMIDE 20 MG PO TABS: 20.0000 mg | ORAL_TABLET | Freq: Every day | ORAL | Status: AC

## 2023-06-09 NOTE — Assessment & Plan Note (Signed)
Reports mood is good.

## 2023-06-09 NOTE — Assessment & Plan Note (Signed)
 Resume crestor for secondary stroke preveniton.

## 2023-06-09 NOTE — Assessment & Plan Note (Signed)
 Stable with albuterol. Not using advair regularly.  Will likely restart with pollen. Will send dose for 100/50 rather than starting the 500/50 dose.

## 2023-06-09 NOTE — Assessment & Plan Note (Addendum)
 Continues Eliquis 5mg  bid. Will need at least 3 months of anticoagulation for DVT. Will need to determine need for Eliquis from a cardiac standpoint at the completion of the 3 months.

## 2023-06-09 NOTE — Assessment & Plan Note (Addendum)
 Clinically stable on leflunomide, he is following with rheumatology. Advised pt to follow up with his rheumatologist for refills of leflunomide.

## 2023-06-09 NOTE — Assessment & Plan Note (Signed)
 Statin was held during his hospitalization due to elevated LFT's during acute illness.  LFT's were normal at last check. Will restart crestor 10 mg.

## 2023-06-09 NOTE — Assessment & Plan Note (Addendum)
 LVEF <20 % on echo in February. Reports doing well with his demadex.  Appears clinically euvolemic.  Wt Readings from Last 3 Encounters:  06/09/23 172 lb (78 kg)  05/03/23 171 lb 15.3 oz (78 kg)  04/24/23 171 lb 14.4 oz (78 kg)   Weight stable recently.

## 2023-06-09 NOTE — Progress Notes (Cosign Needed)
   Established Patient Office Visit  Subjective   Patient ID: Fabion E Swaziland, male    DOB: 1940/08/17  Age: 83 y.o. MRN: [id number]  Chief Complaint  Patient presents with  . Hospitalization Follow-up    Mr. Swaziland presents for routine follow up. He is      ROS    Objective:     BP 121/70 (BP Location: Left Arm, Patient Position: Sitting, Cuff Size: Normal)   Pulse 87   Temp 99.2 F (37.3 C) (Oral)   Resp 16   Ht 5\' 11"  (1.803 m)   Wt 172 lb (78 kg)   SpO2 98%   BMI 23.99 kg/m    Physical Exam   No results found for any visits on 06/09/23.    The ASCVD Risk score (Arnett DK, et al., 2019) failed to calculate for the following reasons:   The 2019 ASCVD risk score is only valid for ages 51 to 88   Risk score cannot be calculated because patient has a medical history suggesting prior/existing ASCVD    Assessment & Plan:   Problem List Items Addressed This Visit   None   No follow-ups on file.    Cristopher Peru, RN

## 2023-06-09 NOTE — Progress Notes (Signed)
 Subjective:     Patient ID: Brandon [name], male    DOB: 11-26-1940, 83 y.o.   MRN: [id number]  Chief Complaint  Patient presents with   Hospitalization Follow-up    HPI  Discussed the use of AI scribe software for clinical note transcription with the patient, who gave verbal consent to proceed.  History of Present Illness Brandon [name] is an 83 year old male with a history of stroke and blood clot who presents for follow-up after recent hospitalization and rehabilitation. He is accompanied today by his wife.   He was recently discharged from a rehabilitation facility following a hospitalization where he underwent cardioversion and developed a blood clot in his arm. Since returning home on May 23, 2023, he feels well overall. He experienced a fall a week or two ago but sustained no injuries.  He is currently taking torsemide, two pills twice a day, for fluid management and reports doing well with it. He was previously on cholesterol medication, likely Crestor, which was discontinued during his hospitalization. He is not currently taking any cholesterol medication. He is taking pantoprazole in the morning before meals, which was started after his hospitalization, but he reports no significant acid-related symptoms. He is on Eliquis, 5 mg twice a day, for a blood clot. He was previously on Jardiance, which was stopped during his first hospitalization in February. He is not currently on this medication. He requires a refill of leflunomide for arthritis, which is managed by his rheumatologist.  He has lost a significant amount of weight but has been stable for the last couple of months. He maintains a good appetite, eating three meals a day.  He experiences neuropathy, which has stabilized at night. He is taking gabapentin for this condition but wants to avoid long-term use. His B12 and folate levels have been low in the past, and he is awaiting re-evaluation of these levels.  He uses  Advair in the morning and at night for asthma, which is well-controlled. He has not needed to use his rescue inhaler recently.  No current pain in his hands or joints and has had success with hip injections, avoiding the need for hip replacement.      Health Maintenance Due  Topic Date Due   Zoster Vaccines- Shingrix (2 of 2) 06/10/2021   COVID-19 Vaccine (7 - 2024-25 season) 11/14/2022   Medicare Annual Wellness (AWV)  07/01/2023    Past Medical History:  Diagnosis Date   Anemia    Arm DVT (deep venous thromboembolism), acute, right (HCC)    Asthma    Hx of childhood asthma, disappeared for a while, then resurfaced 6-7 years ago.    Cardiomyopathy    with a negative cardiac catheterization in the past. 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     Past Surgical History:  Procedure Laterality Date   CARDIOVERSION N/A 02/28/2023   Procedure: CARDIOVERSION;  Surgeon: Lewayne Bunting, MD;  Location: [facility];  Service: Cardiovascular;  Laterality: N/A;   CARDIOVERSION N/A 04/19/2023   Procedure: CARDIOVERSION;  Surgeon: Dorthula Nettles, DO;  Location: [facility];  Service: Cardiovascular;  Laterality: N/A;   None     RIGHT HEART CATH N/A 01/13/2023  Procedure: RIGHT HEART CATH;  Surgeon: Dorthula Nettles, DO;  Location: [facility];  Service: Cardiovascular;  Laterality: N/A;   TRANSESOPHAGEAL ECHOCARDIOGRAM (CATH LAB) N/A 01/14/2023   Procedure: TRANSESOPHAGEAL ECHOCARDIOGRAM;  Surgeon: Thurmon Fair, MD;  Location: [facility];  Service: Cardiovascular;  Laterality: N/A;   TRANSESOPHAGEAL ECHOCARDIOGRAM (CATH LAB) N/A 02/28/2023   Procedure: TRANSESOPHAGEAL ECHOCARDIOGRAM;  Surgeon: Lewayne Bunting, MD;  Location: [facility] INVASIVE CV LAB;   Service: Cardiovascular;  Laterality: N/A;   TRANSESOPHAGEAL ECHOCARDIOGRAM (CATH LAB) N/A 04/19/2023   Procedure: TRANSESOPHAGEAL ECHOCARDIOGRAM;  Surgeon: Dorthula Nettles, DO;  Location: [facility];  Service: Cardiovascular;  Laterality: N/A;    Family History  Problem Relation Age of Onset   Multiple myeloma Father    Lupus Sister        died at 56   Diabetes Mellitus II Sister        died from covid-19   Neuropathy Sister    Asthma Daughter    Colon cancer Neg Hx    Esophageal cancer Neg Hx    Stomach cancer Neg Hx    Pancreatic cancer Neg Hx     Social History   Socioeconomic History   Marital status: Married    Spouse name: [name]   Number of children: 3   Years of education: Not on file   Highest education level: Bachelor's degree (e.g., BA, AB, BS)  Occupational History   Occupation: retired  Tobacco Use   Smoking status: Former   Smokeless tobacco: Never   Tobacco comments:    quit smoling in 06/28/88. started when 18, 1 ppd  Vaping Use   Vaping status: Never Used  Substance and Sexual Activity   Alcohol use: Not Currently    Comment: occ   Drug use: Never   Sexual activity: Not Currently  Other Topics Concern   Not on file  Social History Narrative   Grew up in [place], attended [employer]. First wife died of breast ca in 1997-06-28. 3 children. Remarried- 8 years. 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    Pt signed designated party release granting access to [facility] to his wife [name]. Detailed message may be left on home or cell phone. Roselle Locus August 13, 2009 11:34 am.    Social Drivers of Health   Financial Resource Strain: Low Risk  (01/13/2023)   Overall Financial Resource Strain (CARDIA)    Difficulty of Paying Living Expenses: Not hard at all  Food Insecurity: No Food Insecurity (05/04/2023)   Hunger Vital Sign    Worried About Running Out of Food in the Last Year: Never true    Ran Out of Food in the Last Year:  Never true  Transportation Needs: No Transportation Needs (05/04/2023)   PRAPARE - Administrator, Civil Service (Medical): No    Lack of Transportation (Non-Medical): No  Physical Activity: Insufficiently Active (07/25/2020)   Exercise Vital Sign    Days of Exercise per Week: 5 days    Minutes of Exercise per Session: 10 min  Stress: No Stress Concern Present (05/28/2021)   [employer] of Occupational Health - Occupational Stress Questionnaire    Feeling of Stress : Not at all  Social Connections: Socially Integrated (05/04/2023)   Social Connection and Isolation Panel [NHANES]    Frequency of Communication with Friends and Family: More than three times a week    Frequency of Social Gatherings with Friends and Family: Three times  a week    Attends Religious Services: More than 4 times per year    Active Member of Clubs or Organizations: Yes    Attends Banker Meetings: More than 4 times per year    Marital Status: Married  Catering manager Violence: Not At Risk (05/04/2023)   Humiliation, Afraid, Rape, and Kick questionnaire    Fear of Current or Ex-Partner: No    Emotionally Abused: No    Physically Abused: No    Sexually Abused: No    Outpatient Medications Prior to Visit  Medication Sig Dispense Refill   acetaminophen (TYLENOL) 500 MG tablet Take 500 mg by mouth in the morning, at noon, and at bedtime.     albuterol (PROVENTIL) (2.5 MG/3ML) 0.083% nebulizer solution TAKE 3 MLS BY NEBULIZER EVERY 6 HOURS AS NEEDED FOR WHEEZING FOR SHORTNESS OF BREATH 150 mL 3   amiodarone (PACERONE) 200 MG tablet Take 1 tablet (200 mg total) by mouth daily. 30 tablet 2   apixaban (ELIQUIS) 5 MG TABS tablet Take 2 tablets (10 mg total) by mouth 2 (two) times daily for 3 days, THEN 1 tablet (5 mg total) 2 (two) times daily.     gabapentin (NEURONTIN) 300 MG capsule Take 1 capsule (300 mg total) by mouth at bedtime. 90 capsule 1   torsemide (DEMADEX) 20 MG tablet Take 2  tablets (40 mg total) by mouth daily. 60 tablet 2   ALPRAZolam (XANAX) 0.25 MG tablet Take 0.25 mg by mouth 3 (three) times daily as needed for anxiety (for 14 days).     citalopram (CELEXA) 10 MG tablet Take 10 mg by mouth daily.     fluticasone-salmeterol (ADVAIR) 500-50 MCG/ACT AEPB Inhale 1 puff into the lungs in the morning and at bedtime. 60 each 6   pantoprazole (PROTONIX) 40 MG tablet Take 1 tablet (40 mg total) by mouth daily.     No facility-administered medications prior to visit.    Allergies  Allergen Reactions   Ace Inhibitors Cough   Isosorb Dinitrate-Hydralazine Other (See Comments)    dizziness/hypotensive    ROS See HPI    Objective:    Physical Exam Constitutional:      General: He is not in acute distress.    Appearance: He is well-developed.  HENT:     Head: Normocephalic and atraumatic.  Cardiovascular:     Rate and Rhythm: Normal rate. Rhythm irregular.     Heart sounds: No murmur heard. Pulmonary:     Effort: Pulmonary effort is normal. No respiratory distress.     Breath sounds: Normal breath sounds. No wheezing or rales.  Skin:    General: Skin is warm and dry.  Neurological:     Mental Status: He is alert and oriented to person, place, and time.  Psychiatric:        Behavior: Behavior normal.        Thought Content: Thought content normal.      BP 121/70 (BP Location: Left Arm, Patient Position: Sitting, Cuff Size: Normal)   Pulse 87   Temp 99.2 F (37.3 C) (Oral)   Resp 16   Ht 5\' 11"  (1.803 m)   Wt 172 lb (78 kg)   SpO2 98%   BMI 23.99 kg/m  Wt Readings from Last 3 Encounters:  06/09/23 172 lb (78 kg)  05/03/23 171 lb 15.3 oz (78 kg)  04/24/23 171 lb 14.4 oz (78 kg)       Assessment & Plan:   Problem List Items  Addressed This Visit       Unprioritized   Stage 3a chronic kidney disease (HCC) - Primary   Will reschedule a follow up visit with Dr. Malen Gauze Nephrology.       Relevant Orders   Comp Met (CMET)   Rheumatoid  arthritis (HCC)   Clinically stable on leflunomide, he is following with rheumatology. Advised pt to follow up with his rheumatologist for refills of leflunomide.       Relevant Medications   leflunomide (ARAVA) 20 MG tablet   Neuropathy   Reports pain is stable.        Hyperlipidemia   Statin was held during his hospitalization due to elevated LFT's during acute illness.  LFT's were normal at last check. Will restart crestor 10 mg.       Relevant Medications   rosuvastatin (CRESTOR) 10 MG tablet   Other Relevant Orders   Comp Met (CMET)   HFrEF (heart failure with reduced ejection fraction) (HCC)   LVEF <20 % on echo in February. Reports doing well with his demadex.  Appears clinically euvolemic.  Wt Readings from Last 3 Encounters:  06/09/23 172 lb (78 kg)  05/03/23 171 lb 15.3 oz (78 kg)  04/24/23 171 lb 14.4 oz (78 kg)   Weight stable recently.        Relevant Medications   rosuvastatin (CRESTOR) 10 MG tablet   Folic acid deficiency   Relevant Orders   Folate   Depression   Reports mood is good.        CVA (cere[facility] vascular accident) [facility])   Resume crestor for secondary stroke preveniton.       Relevant Medications   rosuvastatin (CRESTOR) 10 MG tablet   B12 deficiency   Relevant Orders   B12   Asthma   Stable with albuterol. Not using advair regularly.  Will likely restart with pollen. Will send dose for 100/50 rather than starting the 500/50 dose.       Relevant Medications   fluticasone-salmeterol (ADVAIR) 100-50 MCG/ACT AEPB   Acute deep vein thrombosis (DVT) of right upper extremity (HCC)   Continues Eliquis 5mg  bid. Will need at least 3 months of anticoagulation for DVT. Will need to determine need for Eliquis from a cardiac standpoint at the completion of the 3 months.       Relevant Medications   rosuvastatin (CRESTOR) 10 MG tablet    I have discontinued Jonelle Sports. Kitagawa's fluticasone-salmeterol, citalopram, ALPRAZolam, and pantoprazole. I am  also having him start on rosuvastatin, fluticasone-salmeterol, and leflunomide. Additionally, I am having him maintain his albuterol, gabapentin, amiodarone, torsemide, acetaminophen, and apixaban.  Meds ordered this encounter  Medications   rosuvastatin (CRESTOR) 10 MG tablet    Sig: Take 1 tablet (10 mg total) by mouth daily.    Dispense:  90 tablet    Refill:  3    Supervising Provider:   Danise Edge A [[id number]]   fluticasone-salmeterol (ADVAIR) 100-50 MCG/ACT AEPB    Sig: Inhale 1 puff into the lungs 2 (two) times daily.    Dispense:  1 each    Refill:  3    Supervising Provider:   Danise Edge A [[id number]]   leflunomide (ARAVA) 20 MG tablet    Sig: Take 1 tablet (20 mg total) by mouth daily.    Supervising Provider:   Danise Edge A [phone]

## 2023-06-09 NOTE — Patient Instructions (Signed)
 VISIT SUMMARY:  Today, we reviewed your recent hospitalization and rehabilitation progress. You are feeling well overall, with no injuries from a recent fall. We discussed your current medications and made some adjustments to better manage your health conditions.  YOUR PLAN:  -ATRIAL FIBRILLATION: Atrial fibrillation is an irregular and often rapid heart rate that can increase your risk of strokes. You are currently on Eliquis to prevent blood clots, and we will review the cardiologist's notes to determine how long you need to continue this medication.  -BLOOD CLOT IN ARM: A blood clot in the arm can block blood flow and cause swelling and pain. You should continue taking Eliquis 5 mg twice daily for at least three months to manage this condition.  -CHRONIC KIDNEY DISEASE: Chronic kidney disease means your kidneys are damaged and can't filter blood as well as they should. We need to update your kidney function tests and reschedule your appointment with Dr. Malen Gauze, your nephrologist.  -HYPERLIPIDEMIA: Hyperlipidemia is having high levels of fats in your blood, which can increase your risk of heart disease. Since your liver function has normalized, we will restart Crestor to help manage your cholesterol levels.  -PERIPHERAL NEUROPATHY: Peripheral neuropathy is a result of damage to the nerves outside of the brain and spinal cord, often causing weakness, numbness, and pain. We will check your B12 and folate levels and supplement them if they are low.  -ASTHMA: Asthma is a condition in which your airways narrow and swell, producing extra mucus. You are using Advair and have no significant symptoms, so we may consider lowering your dosage and will monitor your symptoms, especially during pollen season.  -RHEUMATOID ARTHRITIS: Rheumatoid arthritis is an autoimmune disorder that causes joint inflammation and pain. You are currently on leflunomide and have no joint pain. We will contact your rheumatologist  for a refill of this medication.  -GASTROESOPHAGEAL REFLUX DISEASE (GERD): GERD is a digestive disorder that affects the lower esophageal sphincter, leading to acid reflux and heartburn. Since you have no symptoms, we will discontinue pantoprazole and monitor for any heartburn or acid reflux symptoms.  -GENERAL HEALTH MAINTENANCE: Your mood is stable, so we will discontinue citalopram and alprazolam. Continue to maintain a healthy lifestyle, including a balanced diet and regular exercise.  INSTRUCTIONS:  Please schedule follow-up appointments with your cardiologist and nephrologist, Dr. Malen Gauze. We will also need updated blood work. Follow up with [place] in two months.

## 2023-06-09 NOTE — Assessment & Plan Note (Signed)
 Will reschedule a follow up visit with Dr. Malen Gauze Nephrology.

## 2023-06-09 NOTE — Assessment & Plan Note (Signed)
 Reports pain is stable.

## 2023-06-09 NOTE — Telephone Encounter (Signed)
 I called and left a message on VM to come back and redraw please transfer our patient to our office to discuss.

## 2023-06-10 ENCOUNTER — Telehealth: Payer: Self-pay

## 2023-06-10 DIAGNOSIS — I429 Cardiomyopathy, unspecified: Secondary | ICD-10-CM | POA: Diagnosis not present

## 2023-06-10 DIAGNOSIS — Z7901 Long term (current) use of anticoagulants: Secondary | ICD-10-CM | POA: Diagnosis not present

## 2023-06-10 DIAGNOSIS — N184 Chronic kidney disease, stage 4 (severe): Secondary | ICD-10-CM | POA: Diagnosis not present

## 2023-06-10 DIAGNOSIS — I480 Paroxysmal atrial fibrillation: Secondary | ICD-10-CM | POA: Diagnosis not present

## 2023-06-10 DIAGNOSIS — I714 Abdominal aortic aneurysm, without rupture, unspecified: Secondary | ICD-10-CM | POA: Diagnosis not present

## 2023-06-10 DIAGNOSIS — I272 Pulmonary hypertension, unspecified: Secondary | ICD-10-CM | POA: Diagnosis not present

## 2023-06-10 DIAGNOSIS — I130 Hypertensive heart and chronic kidney disease with heart failure and stage 1 through stage 4 chronic kidney disease, or unspecified chronic kidney disease: Secondary | ICD-10-CM | POA: Diagnosis not present

## 2023-06-10 DIAGNOSIS — M6281 Muscle weakness (generalized): Secondary | ICD-10-CM | POA: Diagnosis not present

## 2023-06-10 DIAGNOSIS — J449 Chronic obstructive pulmonary disease, unspecified: Secondary | ICD-10-CM | POA: Diagnosis not present

## 2023-06-10 DIAGNOSIS — Z8673 Personal history of transient ischemic attack (TIA), and cerebral infarction without residual deficits: Secondary | ICD-10-CM | POA: Diagnosis not present

## 2023-06-10 DIAGNOSIS — J45909 Unspecified asthma, uncomplicated: Secondary | ICD-10-CM | POA: Diagnosis not present

## 2023-06-10 DIAGNOSIS — G4733 Obstructive sleep apnea (adult) (pediatric): Secondary | ICD-10-CM | POA: Diagnosis not present

## 2023-06-10 DIAGNOSIS — I5020 Unspecified systolic (congestive) heart failure: Secondary | ICD-10-CM | POA: Diagnosis not present

## 2023-06-10 DIAGNOSIS — Z87891 Personal history of nicotine dependence: Secondary | ICD-10-CM | POA: Diagnosis not present

## 2023-06-10 DIAGNOSIS — D631 Anemia in chronic kidney disease: Secondary | ICD-10-CM | POA: Diagnosis not present

## 2023-06-10 DIAGNOSIS — R5381 Other malaise: Secondary | ICD-10-CM | POA: Diagnosis not present

## 2023-06-10 NOTE — Telephone Encounter (Signed)
 Talked to patient and he's coming in at 1:30 on Monday.

## 2023-06-10 NOTE — Addendum Note (Signed)
 Addended by: Harrison Mons on: 06/10/2023 08:42 AM   Modules accepted: Orders

## 2023-06-10 NOTE — Telephone Encounter (Signed)
 Called patient and informed him that [employer] had sent a fax informing us that they still needed him to call them and give them his stated annual income. They need this to process his patient assistance form for his eliquis . He voiced understanding and stated he will call.

## 2023-06-13 ENCOUNTER — Other Ambulatory Visit (INDEPENDENT_AMBULATORY_CARE_PROVIDER_SITE_OTHER)

## 2023-06-13 DIAGNOSIS — N1831 Chronic kidney disease, stage 3a: Secondary | ICD-10-CM

## 2023-06-13 DIAGNOSIS — E538 Deficiency of other specified B group vitamins: Secondary | ICD-10-CM | POA: Diagnosis not present

## 2023-06-13 DIAGNOSIS — E785 Hyperlipidemia, unspecified: Secondary | ICD-10-CM

## 2023-06-13 NOTE — Progress Notes (Signed)
Redraw, no charge today. 

## 2023-06-14 ENCOUNTER — Ambulatory Visit: Payer: Self-pay | Admitting: Licensed Clinical Social Worker

## 2023-06-14 ENCOUNTER — Encounter: Payer: Self-pay | Admitting: Family

## 2023-06-14 DIAGNOSIS — J449 Chronic obstructive pulmonary disease, unspecified: Secondary | ICD-10-CM | POA: Diagnosis not present

## 2023-06-14 DIAGNOSIS — M6281 Muscle weakness (generalized): Secondary | ICD-10-CM | POA: Diagnosis not present

## 2023-06-14 DIAGNOSIS — Z7901 Long term (current) use of anticoagulants: Secondary | ICD-10-CM | POA: Diagnosis not present

## 2023-06-14 DIAGNOSIS — I5020 Unspecified systolic (congestive) heart failure: Secondary | ICD-10-CM | POA: Diagnosis not present

## 2023-06-14 DIAGNOSIS — G4733 Obstructive sleep apnea (adult) (pediatric): Secondary | ICD-10-CM | POA: Diagnosis not present

## 2023-06-14 DIAGNOSIS — Z87891 Personal history of nicotine dependence: Secondary | ICD-10-CM | POA: Diagnosis not present

## 2023-06-14 DIAGNOSIS — N184 Chronic kidney disease, stage 4 (severe): Secondary | ICD-10-CM | POA: Diagnosis not present

## 2023-06-14 DIAGNOSIS — I480 Paroxysmal atrial fibrillation: Secondary | ICD-10-CM | POA: Diagnosis not present

## 2023-06-14 DIAGNOSIS — I272 Pulmonary hypertension, unspecified: Secondary | ICD-10-CM | POA: Diagnosis not present

## 2023-06-14 DIAGNOSIS — I130 Hypertensive heart and chronic kidney disease with heart failure and stage 1 through stage 4 chronic kidney disease, or unspecified chronic kidney disease: Secondary | ICD-10-CM | POA: Diagnosis not present

## 2023-06-14 DIAGNOSIS — I429 Cardiomyopathy, unspecified: Secondary | ICD-10-CM | POA: Diagnosis not present

## 2023-06-14 DIAGNOSIS — I714 Abdominal aortic aneurysm, without rupture, unspecified: Secondary | ICD-10-CM | POA: Diagnosis not present

## 2023-06-14 DIAGNOSIS — J45909 Unspecified asthma, uncomplicated: Secondary | ICD-10-CM | POA: Diagnosis not present

## 2023-06-14 DIAGNOSIS — D631 Anemia in chronic kidney disease: Secondary | ICD-10-CM | POA: Diagnosis not present

## 2023-06-14 DIAGNOSIS — R5381 Other malaise: Secondary | ICD-10-CM | POA: Diagnosis not present

## 2023-06-14 DIAGNOSIS — Z8673 Personal history of transient ischemic attack (TIA), and cerebral infarction without residual deficits: Secondary | ICD-10-CM | POA: Diagnosis not present

## 2023-06-14 LAB — COMPREHENSIVE METABOLIC PANEL WITH GFR
ALT: 12 U/L (ref 0–53)
AST: 12 U/L (ref 0–37)
Albumin: 3.4 g/dL — ABNORMAL LOW (ref 3.5–5.2)
Alkaline Phosphatase: 51 U/L (ref 39–117)
BUN: 37 mg/dL — ABNORMAL HIGH (ref 6–23)
CO2: 30 meq/L (ref 19–32)
Calcium: 9.7 mg/dL (ref 8.4–10.5)
Chloride: 100 meq/L (ref 96–112)
Creatinine, Ser: 2.54 mg/dL — ABNORMAL HIGH (ref 0.40–1.50)
GFR: 22.91 mL/min — ABNORMAL LOW (ref >60.00)
Glucose, Bld: 84 mg/dL (ref 70–99)
Potassium: 4.6 meq/L (ref 3.5–5.1)
Sodium: 138 meq/L (ref 135–145)
Total Bilirubin: 0.7 mg/dL (ref 0.2–1.2)
Total Protein: 6 g/dL (ref 6.0–8.3)

## 2023-06-14 LAB — VITAMIN B12: Vitamin B-12: 532 pg/mL (ref 211–911)

## 2023-06-14 LAB — FOLATE: Folate: 8.5 ng/mL (ref >5.9)

## 2023-06-14 NOTE — Patient Outreach (Signed)
 Care Coordination   Initial Visit Note   06/14/2023 Name: Khoi E Swaziland MRN: [id number] DOB: 12-22-1940  Mason E Swaziland is a 83 y.o. year old male who sees [name], NP for primary care. I spoke with  Juno E Swaziland by phone today.  What matters to the patients health and wellness today?  Meals on Wheels and MOM's meals     Goals Addressed             This Visit's Progress    Care Coordination Activities       Care Coordination Interventions: Patient was recently hospitalized and also went to a SNF and was discharged on 05/23/2023, he is staying with his daughter Elisha Headland) due to his house with his wife in [place] having stairs, he does plan on going back to him home at some point and time.  Patient is interested in any day meals that he can get, the patient and family has already contacted [facility] and enquired about MOM's meals and the SW will also put a referral in for MOW. SW will follow up 06/28/2023 at 2:30 pm        SDOH assessments and interventions completed:  Yes  SDOH Interventions Today    Flowsheet Row Most Recent Value  SDOH Interventions   Food Insecurity Interventions Community Resources Provided  [SW will make a referral for MOW, Patient is signed up for MOM's meals through [employer]]  Housing Interventions Intervention Not Indicated  Transportation Interventions Intervention Not Indicated  Utilities Interventions Intervention Not Indicated        Care Coordination Interventions:  Yes, provided  Interventions Today    Flowsheet Row Most Recent Value  General Interventions   General Interventions Discussed/Reviewed General Interventions Discussed  [SW will make a referral to MOW and the patient is signed up for MOM's through [employer]]        Follow up plan: Follow up call scheduled for 06/28/2023 at 2:30 pm    Encounter Outcome:  Patient Visit Completed Jeanie Cooks, PhD [facility], [facility] Social  Worker Direct Dial: [phone]  Fax: [phone]

## 2023-06-14 NOTE — Patient Instructions (Signed)
 Visit Information  Thank you for taking time to visit with me today. Please don't hesitate to contact me if I can be of assistance to you.   Following are the goals we discussed today:   Goals Addressed             This Visit's Progress    Care Coordination Activities       Care Coordination Interventions: Patient was recently hospitalized and also went to a SNF and was discharged on 05/23/2023, he is staying with his daughter Elisha Headland) due to his house with his wife in [place] having stairs, he does plan on going back to him home at some point and time.  Patient is interested in any day meals that he can get, the patient and family has already contacted [facility] and enquired about MOM's meals and the SW will also put a referral in for MOW. SW will follow up 06/28/2023 at 2:30 pm        Our next appointment is by telephone on 06/28/2023 at 2:30 pm  Please call the care guide team at [phone] if you need to cancel or reschedule your appointment.   If you are experiencing a Mental Health or Behavioral Health Crisis or need someone to talk to, please call the Suicide and Crisis Lifeline: 988 go to [facility] Urgent [facility] [address], [place] [phone]) call 911  Patient verbalizes understanding of instructions and care plan provided today and agrees to view in MyChart. Active MyChart status and patient understanding of how to access instructions and care plan via MyChart confirmed with patient.     Jeanie Cooks, PhD [facility], [facility] Social Worker Direct Dial: [phone]  Fax: [phone]

## 2023-06-15 DIAGNOSIS — M6281 Muscle weakness (generalized): Secondary | ICD-10-CM | POA: Diagnosis not present

## 2023-06-15 DIAGNOSIS — I429 Cardiomyopathy, unspecified: Secondary | ICD-10-CM | POA: Diagnosis not present

## 2023-06-15 DIAGNOSIS — G4733 Obstructive sleep apnea (adult) (pediatric): Secondary | ICD-10-CM | POA: Diagnosis not present

## 2023-06-15 DIAGNOSIS — Z87891 Personal history of nicotine dependence: Secondary | ICD-10-CM | POA: Diagnosis not present

## 2023-06-15 DIAGNOSIS — I130 Hypertensive heart and chronic kidney disease with heart failure and stage 1 through stage 4 chronic kidney disease, or unspecified chronic kidney disease: Secondary | ICD-10-CM | POA: Diagnosis not present

## 2023-06-15 DIAGNOSIS — R5381 Other malaise: Secondary | ICD-10-CM | POA: Diagnosis not present

## 2023-06-15 DIAGNOSIS — Z8673 Personal history of transient ischemic attack (TIA), and cerebral infarction without residual deficits: Secondary | ICD-10-CM | POA: Diagnosis not present

## 2023-06-15 DIAGNOSIS — N184 Chronic kidney disease, stage 4 (severe): Secondary | ICD-10-CM | POA: Diagnosis not present

## 2023-06-15 DIAGNOSIS — D631 Anemia in chronic kidney disease: Secondary | ICD-10-CM | POA: Diagnosis not present

## 2023-06-15 DIAGNOSIS — J449 Chronic obstructive pulmonary disease, unspecified: Secondary | ICD-10-CM | POA: Diagnosis not present

## 2023-06-15 DIAGNOSIS — I272 Pulmonary hypertension, unspecified: Secondary | ICD-10-CM | POA: Diagnosis not present

## 2023-06-15 DIAGNOSIS — Z7901 Long term (current) use of anticoagulants: Secondary | ICD-10-CM | POA: Diagnosis not present

## 2023-06-15 DIAGNOSIS — I480 Paroxysmal atrial fibrillation: Secondary | ICD-10-CM | POA: Diagnosis not present

## 2023-06-15 DIAGNOSIS — J45909 Unspecified asthma, uncomplicated: Secondary | ICD-10-CM | POA: Diagnosis not present

## 2023-06-15 DIAGNOSIS — I5020 Unspecified systolic (congestive) heart failure: Secondary | ICD-10-CM | POA: Diagnosis not present

## 2023-06-15 DIAGNOSIS — I714 Abdominal aortic aneurysm, without rupture, unspecified: Secondary | ICD-10-CM | POA: Diagnosis not present

## 2023-06-16 ENCOUNTER — Telehealth: Payer: Self-pay

## 2023-06-16 DIAGNOSIS — M25552 Pain in left hip: Secondary | ICD-10-CM | POA: Diagnosis not present

## 2023-06-16 DIAGNOSIS — M5136 Other intervertebral disc degeneration, lumbar region with discogenic back pain only: Secondary | ICD-10-CM | POA: Diagnosis not present

## 2023-06-16 DIAGNOSIS — M0579 Rheumatoid arthritis with rheumatoid factor of multiple sites without organ or systems involvement: Secondary | ICD-10-CM | POA: Diagnosis not present

## 2023-06-16 DIAGNOSIS — M25522 Pain in left elbow: Secondary | ICD-10-CM | POA: Diagnosis not present

## 2023-06-16 DIAGNOSIS — N1832 Chronic kidney disease, stage 3b: Secondary | ICD-10-CM | POA: Diagnosis not present

## 2023-06-16 DIAGNOSIS — Z79899 Other long term (current) drug therapy: Secondary | ICD-10-CM | POA: Diagnosis not present

## 2023-06-16 DIAGNOSIS — Z6823 Body mass index (BMI) 23.0-23.9, adult: Secondary | ICD-10-CM | POA: Diagnosis not present

## 2023-06-16 NOTE — Patient Outreach (Signed)
 Care Coordination   Follow Up Visit Note   06/16/2023 Name: Brandon [name] MRN: [id number] DOB: 02-02-1941  Brandon [name] is a 83 y.o. year old male who sees [name], NP for primary care. I spoke with  Brandon [name] by phone today.  What matters to the patients health and wellness today?  [name] reports he has been discharged from SNF, "I am doing well". He is unable to talk at this time as he is in a Rheumatology appointment.   Goals Addressed             This Visit's Progress    Assist with health management       Interventions Today    Flowsheet Row Most Recent Value  General Interventions   General Interventions Discussed/Reviewed General Interventions Reviewed  [general overall status discussed. discussed transition to newly assigned care manager.]            SDOH assessments and interventions completed:  No  Care Coordination Interventions:  No, not indicated   Follow up plan:  RNCM will request care guide call patient to reschedule with newly assigned care manager    Encounter Outcome:  Patient Visit Completed   [name], RN, MSN, BSN, CCM [place]  [facility], Population Health Case Manager Phone: [phone]

## 2023-06-20 ENCOUNTER — Other Ambulatory Visit: Payer: Self-pay | Admitting: Family

## 2023-06-20 NOTE — Telephone Encounter (Signed)
 Copied from CRM [id number]. Topic: Clinical - Medication Refill >> Jun 20, 2023  2:29 PM Aletta Edouard wrote: Most Recent Primary Care Visit:  Provider: [facility]  [facility]  Visit Type: LAB VISIT  Date: 06/13/2023  Medication: apixaban (ELIQUIS) 5 MG TABS tablet   Has the patient contacted their pharmacy? Yes (Agent: If no, request that the patient contact the pharmacy for the refill. If patient does not wish to contact the pharmacy document the reason why and proceed with request.) (Agent: If yes, when and what did the pharmacy advise?)  Is this the correct pharmacy for this prescription? Yes If no, delete pharmacy and type the correct one.  This is the patient's preferred pharmacy:  [facility] [address] [place], [place] - [address] [place] [address] Phone: [phone] Fax: [phone]  Has the prescription been filled recently? No  Is the patient out of the medication? Yes  Has the patient been seen for an appointment in the last year OR does the patient have an upcoming appointment? Yes  Can we respond through MyChart? No  Agent: Please be advised that Rx refills may take up to 3 business days. We ask that you follow-up with your pharmacy.

## 2023-06-21 ENCOUNTER — Telehealth: Payer: Self-pay

## 2023-06-21 ENCOUNTER — Telehealth: Payer: Self-pay | Admitting: Emergency Medicine

## 2023-06-21 DIAGNOSIS — M7989 Other specified soft tissue disorders: Secondary | ICD-10-CM

## 2023-06-21 MED ORDER — APIXABAN 5 MG PO TABS: ORAL_TABLET | ORAL | 5 refills | Status: DC

## 2023-06-21 NOTE — Telephone Encounter (Signed)
 Patient notified per pcp, ok to wait until June. Labs are stable and showed some improvement on last ones.   Copied from CRM [id number]. Topic: Referral - Question >> Jun 20, 2023  2:16 PM Sonny Dandy B wrote: Reason for CRM pt called to advise, he needs to see a kidney specialist sooner than June, requesting another kidney provider. Please call pt back with the updated referral information at [phone]

## 2023-06-21 NOTE — Telephone Encounter (Signed)
 Copied from CRM [id number]. Topic: Clinical - Home Health Verbal Orders >> Jun 21, 2023  4:13 PM Fredrich Romans wrote: Caller/Agency: Mickeal Needy [facility] Callback Number: [phone] Any new concerns about the patient? Yes Weight up 5 pounds this week at 172,left leg is swelling .No shortness of breathe currently. Would like to know if provider would like to change his dieretics for this week to get rid of some of the swelling? She would like to have a call back today before office closes if able to.

## 2023-06-22 ENCOUNTER — Ambulatory Visit (HOSPITAL_BASED_OUTPATIENT_CLINIC_OR_DEPARTMENT_OTHER)
Admission: RE | Admit: 2023-06-22 | Discharge: 2023-06-22 | Disposition: A | Discharge status: Home / Self Care | Source: Ambulatory Visit | Attending: Family | Admitting: Family

## 2023-06-22 DIAGNOSIS — I714 Abdominal aortic aneurysm, without rupture, unspecified: Secondary | ICD-10-CM | POA: Diagnosis not present

## 2023-06-22 DIAGNOSIS — R5381 Other malaise: Secondary | ICD-10-CM | POA: Diagnosis not present

## 2023-06-22 DIAGNOSIS — G4733 Obstructive sleep apnea (adult) (pediatric): Secondary | ICD-10-CM | POA: Diagnosis not present

## 2023-06-22 DIAGNOSIS — M7989 Other specified soft tissue disorders: Secondary | ICD-10-CM | POA: Diagnosis not present

## 2023-06-22 DIAGNOSIS — I5020 Unspecified systolic (congestive) heart failure: Secondary | ICD-10-CM | POA: Diagnosis not present

## 2023-06-22 DIAGNOSIS — I429 Cardiomyopathy, unspecified: Secondary | ICD-10-CM | POA: Diagnosis not present

## 2023-06-22 DIAGNOSIS — Z8673 Personal history of transient ischemic attack (TIA), and cerebral infarction without residual deficits: Secondary | ICD-10-CM | POA: Diagnosis not present

## 2023-06-22 DIAGNOSIS — I272 Pulmonary hypertension, unspecified: Secondary | ICD-10-CM | POA: Diagnosis not present

## 2023-06-22 DIAGNOSIS — I130 Hypertensive heart and chronic kidney disease with heart failure and stage 1 through stage 4 chronic kidney disease, or unspecified chronic kidney disease: Secondary | ICD-10-CM | POA: Diagnosis not present

## 2023-06-22 DIAGNOSIS — J45909 Unspecified asthma, uncomplicated: Secondary | ICD-10-CM | POA: Diagnosis not present

## 2023-06-22 DIAGNOSIS — D631 Anemia in chronic kidney disease: Secondary | ICD-10-CM | POA: Diagnosis not present

## 2023-06-22 DIAGNOSIS — J449 Chronic obstructive pulmonary disease, unspecified: Secondary | ICD-10-CM | POA: Diagnosis not present

## 2023-06-22 DIAGNOSIS — I480 Paroxysmal atrial fibrillation: Secondary | ICD-10-CM | POA: Diagnosis not present

## 2023-06-22 DIAGNOSIS — M6281 Muscle weakness (generalized): Secondary | ICD-10-CM | POA: Diagnosis not present

## 2023-06-22 DIAGNOSIS — N184 Chronic kidney disease, stage 4 (severe): Secondary | ICD-10-CM | POA: Diagnosis not present

## 2023-06-22 DIAGNOSIS — Z87891 Personal history of nicotine dependence: Secondary | ICD-10-CM | POA: Diagnosis not present

## 2023-06-22 DIAGNOSIS — R600 Localized edema: Secondary | ICD-10-CM | POA: Diagnosis not present

## 2023-06-22 DIAGNOSIS — Z7901 Long term (current) use of anticoagulants: Secondary | ICD-10-CM | POA: Diagnosis not present

## 2023-06-22 NOTE — Telephone Encounter (Signed)
 Recommend that he increase demadex to 40mg  bid today and tomorrow, then return to once daily on Friday. Call with follow up weight on Friday.  I would also like for him to complete a LE doppler today to rule out clot.

## 2023-06-22 NOTE — Telephone Encounter (Signed)
 Patient and his Home health nurse were given instructions on increasing demadex to 40 mg Bid  today and tomorrow and get weight on Friday. He has been schedule for the [place] today at 6 pm

## 2023-06-22 NOTE — Addendum Note (Signed)
 Addended by: [name] on: 06/22/2023 09:37 AM   Modules accepted: Orders

## 2023-06-23 ENCOUNTER — Telehealth: Payer: Self-pay | Admitting: Family

## 2023-06-23 NOTE — Telephone Encounter (Signed)
 See mychart.

## 2023-06-24 DIAGNOSIS — D631 Anemia in chronic kidney disease: Secondary | ICD-10-CM | POA: Diagnosis not present

## 2023-06-24 DIAGNOSIS — Z7901 Long term (current) use of anticoagulants: Secondary | ICD-10-CM | POA: Diagnosis not present

## 2023-06-24 DIAGNOSIS — Z87891 Personal history of nicotine dependence: Secondary | ICD-10-CM | POA: Diagnosis not present

## 2023-06-24 DIAGNOSIS — N184 Chronic kidney disease, stage 4 (severe): Secondary | ICD-10-CM | POA: Diagnosis not present

## 2023-06-24 DIAGNOSIS — I130 Hypertensive heart and chronic kidney disease with heart failure and stage 1 through stage 4 chronic kidney disease, or unspecified chronic kidney disease: Secondary | ICD-10-CM | POA: Diagnosis not present

## 2023-06-24 DIAGNOSIS — I714 Abdominal aortic aneurysm, without rupture, unspecified: Secondary | ICD-10-CM | POA: Diagnosis not present

## 2023-06-24 DIAGNOSIS — I480 Paroxysmal atrial fibrillation: Secondary | ICD-10-CM | POA: Diagnosis not present

## 2023-06-24 DIAGNOSIS — M6281 Muscle weakness (generalized): Secondary | ICD-10-CM | POA: Diagnosis not present

## 2023-06-24 DIAGNOSIS — J449 Chronic obstructive pulmonary disease, unspecified: Secondary | ICD-10-CM | POA: Diagnosis not present

## 2023-06-24 DIAGNOSIS — J45909 Unspecified asthma, uncomplicated: Secondary | ICD-10-CM | POA: Diagnosis not present

## 2023-06-24 DIAGNOSIS — I5020 Unspecified systolic (congestive) heart failure: Secondary | ICD-10-CM | POA: Diagnosis not present

## 2023-06-24 DIAGNOSIS — G4733 Obstructive sleep apnea (adult) (pediatric): Secondary | ICD-10-CM | POA: Diagnosis not present

## 2023-06-24 DIAGNOSIS — I272 Pulmonary hypertension, unspecified: Secondary | ICD-10-CM | POA: Diagnosis not present

## 2023-06-24 DIAGNOSIS — I429 Cardiomyopathy, unspecified: Secondary | ICD-10-CM | POA: Diagnosis not present

## 2023-06-24 DIAGNOSIS — Z8673 Personal history of transient ischemic attack (TIA), and cerebral infarction without residual deficits: Secondary | ICD-10-CM | POA: Diagnosis not present

## 2023-06-24 DIAGNOSIS — R5381 Other malaise: Secondary | ICD-10-CM | POA: Diagnosis not present

## 2023-06-27 ENCOUNTER — Telehealth: Payer: Self-pay | Admitting: Family

## 2023-06-27 NOTE — Telephone Encounter (Signed)
 I don't see anything either, was he supposed to have an appt with you?

## 2023-06-27 NOTE — Telephone Encounter (Signed)
 Called patient. There was not an appointment with me or any other [facility] pharmacist that I was able to see for 07/07/2023.  I did make an appointment for an in office visit with me 07/11/2023 at 2pm.

## 2023-06-27 NOTE — Telephone Encounter (Signed)
 Copied from CRM [id number]. Topic: Appointments - Scheduling Inquiry for Clinic >> Jun 27, 2023  2:18 PM Elle L wrote: Reason for CRM: The patient states that he has an appointment with the Pharmacist at the office for the 24th that he wanted to confirm but I am unable to see this in his chart. The patient's call back number is [phone].

## 2023-06-28 ENCOUNTER — Ambulatory Visit: Payer: Self-pay | Admitting: Licensed Clinical Social Worker

## 2023-06-28 DIAGNOSIS — I5020 Unspecified systolic (congestive) heart failure: Secondary | ICD-10-CM | POA: Diagnosis not present

## 2023-06-28 DIAGNOSIS — I272 Pulmonary hypertension, unspecified: Secondary | ICD-10-CM | POA: Diagnosis not present

## 2023-06-28 DIAGNOSIS — Z87891 Personal history of nicotine dependence: Secondary | ICD-10-CM | POA: Diagnosis not present

## 2023-06-28 DIAGNOSIS — I429 Cardiomyopathy, unspecified: Secondary | ICD-10-CM | POA: Diagnosis not present

## 2023-06-28 DIAGNOSIS — I130 Hypertensive heart and chronic kidney disease with heart failure and stage 1 through stage 4 chronic kidney disease, or unspecified chronic kidney disease: Secondary | ICD-10-CM | POA: Diagnosis not present

## 2023-06-28 DIAGNOSIS — Z7901 Long term (current) use of anticoagulants: Secondary | ICD-10-CM | POA: Diagnosis not present

## 2023-06-28 DIAGNOSIS — R5381 Other malaise: Secondary | ICD-10-CM | POA: Diagnosis not present

## 2023-06-28 DIAGNOSIS — Z8673 Personal history of transient ischemic attack (TIA), and cerebral infarction without residual deficits: Secondary | ICD-10-CM | POA: Diagnosis not present

## 2023-06-28 DIAGNOSIS — I480 Paroxysmal atrial fibrillation: Secondary | ICD-10-CM | POA: Diagnosis not present

## 2023-06-28 DIAGNOSIS — N184 Chronic kidney disease, stage 4 (severe): Secondary | ICD-10-CM | POA: Diagnosis not present

## 2023-06-28 DIAGNOSIS — G4733 Obstructive sleep apnea (adult) (pediatric): Secondary | ICD-10-CM | POA: Diagnosis not present

## 2023-06-28 DIAGNOSIS — J449 Chronic obstructive pulmonary disease, unspecified: Secondary | ICD-10-CM | POA: Diagnosis not present

## 2023-06-28 DIAGNOSIS — I714 Abdominal aortic aneurysm, without rupture, unspecified: Secondary | ICD-10-CM | POA: Diagnosis not present

## 2023-06-28 DIAGNOSIS — D631 Anemia in chronic kidney disease: Secondary | ICD-10-CM | POA: Diagnosis not present

## 2023-06-28 DIAGNOSIS — J45909 Unspecified asthma, uncomplicated: Secondary | ICD-10-CM | POA: Diagnosis not present

## 2023-06-28 DIAGNOSIS — M6281 Muscle weakness (generalized): Secondary | ICD-10-CM | POA: Diagnosis not present

## 2023-06-28 NOTE — Patient Outreach (Signed)
 Complex Care Management   Visit Note  06/28/2023  Name:  Brandon [name] MRN: [id number] DOB: 1940/11/19  Situation: Referral received for Complex Care Management related to SDOH Barriers:  Food insecurity I obtained verbal consent from Patient.  Visit completed with Patient  on the phone  Background:   Past Medical History:  Diagnosis Date   Anemia    Arm DVT (deep venous thromboembolism), acute, right (HCC)    Asthma    Hx of childhood asthma, disappeared for a while, then resurfaced 6-7 years ago.    Cardiomyopathy    with a negative cardiac catheterization in the past. 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     Assessment: Patient Reported Symptoms:  Cognitive        Neurological      HEENT        Cardiovascular      Respiratory      Endocrine      Gastrointestinal        Genitourinary      Integumentary      Musculoskeletal          Psychosocial       Quality of Family Relationships: involved, supportive Do you feel physically threatened by others?: No      02/03/2023    1:16 PM  Depression screen PHQ 2/9  Decreased Interest 0  Down, Depressed, Hopeless 0  PHQ - 2 Score 0    There were no vitals filed for this visit.  Medications Reviewed Today   Medications were not reviewed in this encounter     Recommendation:   Waiting on MOW referral to be completed, it was sent in February 2025  Follow Up Plan:   Telephone follow up appointment date/time:  07/12/2023 at 2:30 pm  Jonda Neighbours, PhD [facility], [facility] Social Worker Direct Dial: [phone]  Fax: [phone]

## 2023-06-28 NOTE — Patient Instructions (Signed)
 Visit Information  Thank you for taking time to visit with me today. Please don't hesitate to contact me if I can be of assistance to you before our next scheduled appointment.  Your next care management appointment is by telephone on 07/12/2023 at 2:30 pm  Telephone follow up appointment date/time:  07/12/2023 at 2:30 pm  Please call the care guide team at [phone] if you need to cancel, schedule, or reschedule an appointment.   Please call the Suicide and Crisis Lifeline: 988 go to [facility] Urgent [facility] [address] [place] [phone]) call 911 if you are experiencing a Mental Health or Behavioral Health Crisis or need someone to talk to.  Jonda Neighbours, PhD [facility], [facility] Social Worker Direct Dial: [phone]  Fax: [phone]

## 2023-06-30 ENCOUNTER — Telehealth: Payer: Self-pay

## 2023-06-30 NOTE — Telephone Encounter (Signed)
 FYI

## 2023-06-30 NOTE — Telephone Encounter (Signed)
 Wt Readings from Last 3 Encounters:  06/09/23 172 lb (78 kg)  05/03/23 171 lb 15.3 oz (78 kg)  04/24/23 171 lb 14.4 oz (78 kg)   Weight is good.  Will continue to monitor.    Please advise Demetria to let us  know if pt gains 5 pounds or more in 1 week or 3 pounds or more overnight.

## 2023-06-30 NOTE — Telephone Encounter (Signed)
 Copied from CRM [id number]. Topic: Clinical - Home Health Verbal Orders >> Jun 24, 2023 12:33 PM Cottie Diss B wrote: Caller/Agency: Demetria/ [facility]  Callback Number: [phone] Service Requested: N/A Frequency: N/A Any new concerns about the patient? Relaying patients weight as of this morning is 168 lbs. Was instructed to relay weight or any progress to Dr. Fleming Hum and team and can give callback to discuss.

## 2023-06-30 NOTE — Telephone Encounter (Signed)
 Spoke w/ Demetria- informed of PCP recommendations.

## 2023-07-05 ENCOUNTER — Telehealth: Payer: Self-pay | Admitting: Cardiology

## 2023-07-05 DIAGNOSIS — I429 Cardiomyopathy, unspecified: Secondary | ICD-10-CM | POA: Diagnosis not present

## 2023-07-05 DIAGNOSIS — Z87891 Personal history of nicotine dependence: Secondary | ICD-10-CM | POA: Diagnosis not present

## 2023-07-05 DIAGNOSIS — I130 Hypertensive heart and chronic kidney disease with heart failure and stage 1 through stage 4 chronic kidney disease, or unspecified chronic kidney disease: Secondary | ICD-10-CM | POA: Diagnosis not present

## 2023-07-05 DIAGNOSIS — Z7901 Long term (current) use of anticoagulants: Secondary | ICD-10-CM | POA: Diagnosis not present

## 2023-07-05 DIAGNOSIS — I5020 Unspecified systolic (congestive) heart failure: Secondary | ICD-10-CM | POA: Diagnosis not present

## 2023-07-05 DIAGNOSIS — M6281 Muscle weakness (generalized): Secondary | ICD-10-CM | POA: Diagnosis not present

## 2023-07-05 DIAGNOSIS — G4733 Obstructive sleep apnea (adult) (pediatric): Secondary | ICD-10-CM | POA: Diagnosis not present

## 2023-07-05 DIAGNOSIS — Z8673 Personal history of transient ischemic attack (TIA), and cerebral infarction without residual deficits: Secondary | ICD-10-CM | POA: Diagnosis not present

## 2023-07-05 DIAGNOSIS — I480 Paroxysmal atrial fibrillation: Secondary | ICD-10-CM | POA: Diagnosis not present

## 2023-07-05 DIAGNOSIS — J45909 Unspecified asthma, uncomplicated: Secondary | ICD-10-CM | POA: Diagnosis not present

## 2023-07-05 DIAGNOSIS — N184 Chronic kidney disease, stage 4 (severe): Secondary | ICD-10-CM | POA: Diagnosis not present

## 2023-07-05 DIAGNOSIS — J449 Chronic obstructive pulmonary disease, unspecified: Secondary | ICD-10-CM | POA: Diagnosis not present

## 2023-07-05 DIAGNOSIS — I714 Abdominal aortic aneurysm, without rupture, unspecified: Secondary | ICD-10-CM | POA: Diagnosis not present

## 2023-07-05 DIAGNOSIS — I272 Pulmonary hypertension, unspecified: Secondary | ICD-10-CM | POA: Diagnosis not present

## 2023-07-05 DIAGNOSIS — D631 Anemia in chronic kidney disease: Secondary | ICD-10-CM | POA: Diagnosis not present

## 2023-07-05 DIAGNOSIS — R5381 Other malaise: Secondary | ICD-10-CM | POA: Diagnosis not present

## 2023-07-05 NOTE — Telephone Encounter (Signed)
 Pt c/o medication issue:  1. Name of Medication: Amoxicillin   2. How are you currently taking this medication (dosage and times per day)? Not currently taking  3. Are you having a reaction (difficulty breathing--STAT)? No   4. What is your medication issue? Patient's spouse states he has an abscess on his tooth and requesting a prescription of amoxicillin . She states they have been unable to schedule an appointment with the dentist at this time. Please advise.

## 2023-07-06 DIAGNOSIS — M1612 Unilateral primary osteoarthritis, left hip: Secondary | ICD-10-CM | POA: Diagnosis not present

## 2023-07-06 NOTE — Telephone Encounter (Signed)
 Called and spoke to patient and patient verbalized he has appt with PCP on Monday to be seen for abscess. Advise patient to use salt soaks and take prn tylenol  as needed.  Made patient aware to call office for any other questions. Understanding verbalized

## 2023-07-07 ENCOUNTER — Telehealth: Payer: Self-pay

## 2023-07-07 DIAGNOSIS — K047 Periapical abscess without sinus: Secondary | ICD-10-CM

## 2023-07-07 MED ORDER — AMOXICILLIN-POT CLAVULANATE 875-125 MG PO TABS: 1.0000 | ORAL_TABLET | Freq: Two times a day (BID) | ORAL | 0 refills | Status: DC

## 2023-07-07 NOTE — Telephone Encounter (Signed)
   Copied from CRM [id number]. Topic: General - Other >> Jul 04, 2023 11:06 AM Felizardo Hotter wrote: Reason for CRM:Pt has abscess on tooth, pt needs antibiotics sent to [facility] [address] [place], [place] - [address] [place] [address] Phone: [phone] Fax: [phone]. Please call pt [phone].

## 2023-07-11 ENCOUNTER — Ambulatory Visit (INDEPENDENT_AMBULATORY_CARE_PROVIDER_SITE_OTHER): Admitting: Pharmacist

## 2023-07-11 VITALS — BP 122/70

## 2023-07-11 DIAGNOSIS — Z79899 Other long term (current) drug therapy: Secondary | ICD-10-CM

## 2023-07-11 DIAGNOSIS — I5020 Unspecified systolic (congestive) heart failure: Secondary | ICD-10-CM

## 2023-07-11 DIAGNOSIS — I82621 Acute embolism and thrombosis of deep veins of right upper extremity: Secondary | ICD-10-CM

## 2023-07-11 NOTE — Progress Notes (Unsigned)
 07/11/2023 Name: Brandon [name] MRN: [id number] DOB: [birth year]  Chief Complaint  Patient presents with   Medication Management    Brandon [name] is a 83 y.o. year old male who was referred for medication management by their primary care provider, Dorrene Gaucher, NP. They presented for a face to face visit today.   Patient self referred to Clinical Pharmacist Practitioner to discuss medication changes since he left hospital and rehab.    Subjective:  Care Team: Primary Care Provider: Dorrene Gaucher, NP ; Next Scheduled Visit: 08/10/2023 Cardiologist: Dr Virgil Griffiths; Next Scheduled Visit: 08/16/2023 Nephrologist: Dr Yvonnie Heritage; Next Scheduled Visit: 09/01/2023  Medication Access/Adherence  Current Pharmacy:  [facility] [address] [place], [place] - [address] [place] [address] Phone: [phone] Fax: [phone]  [facility] [address] [place] [address] Phone: [phone] Fax: [phone]   Patient reports affordability concerns with their medications: No  - cost initially for Eliquis  was high until patient met deductible, now cost is $47 per month.  Patient reports access/transportation concerns to their pharmacy: No  Patient reports adherence concerns with their medications:  No     04/15/2023 to 04/28/2023 Hospitalization: For cardiogenic shock in the setting of recurrent atrial fibrillation. Placed on lasix  gtt w/ milrinone  support for palliative diuresis. He diuresed well and was weaned off milrinone  and underwent successful DCCV. Given end-stage HF and progressive cardiorenal syndrome, palliative care team was consulted for GOC discussion. Patient and family opted to transition to comfort care. Palliative care team followed along for comfort/ symptom management. He was continued on amiodarone , gabapentin , inhalers, Singulair  and torsemide  for comfort. All other meds were discontinued.    05/03/2023  Hypertension:  Current medications: *** Medications previously tried:   Patient {HAS/DOES NOT VQQV:95638} a validated, automated, upper arm home BP cuff Current blood pressure readings readings: ***  Patient {Actions; denies-reports:120008} hypotensive s/sx including ***dizziness, lightheadedness.  Patient {Actions; denies-reports:120008} hypertensive symptoms including ***headache, chest pain, shortness of breath  Current meal patterns: ***  Current physical activity: ***   Heart Failure (EF <20% - 05/10/2023):  Current medications:  ACEi/ARB/ARNI: no SGLT2i: no Beta blocker: no Mineralocorticoid Receptor Antagonist: no Diuretic regimen: yes  Current home blood pressure readings: *** Current home weights: ***  Patient {Actions; denies-reports:120008} volume overload signs or symptoms including ***shortness of breath, lower extremity edema, increased use of pillows at night   Current physical activity: ***  Current medication access support: *** Atrial Fibrillation and DVT to right uppper extremity 04/2023:  Current medications: Rate Control:  Rhythm Control:  Anticoagulation Regimen:  Medications tried in the past  CHADS2VASC:   Current blood pressure/heart rate readings: ***  Patient {Actions; denies-reports:120008} hypotensive s/sx including ***dizziness, lightheadedness. Patient {Actions; denies-reports:120008} hypertensive symptoms including ***headache, chest pain, shortness of breath  Current physical activity: ***  Current medication access support: ***   Objective:  Lab Results  Component Value Date   HGBA1C 5.1 04/18/2022    Lab Results  Component Value Date   CREATININE 2.54 (H) 06/13/2023   BUN 37 (H) 06/13/2023   NA 138 06/13/2023   K 4.6 06/13/2023   CL 100 06/13/2023   CO2 30 06/13/2023    Lab Results  Component Value Date   CHOL 112 04/16/2023   HDL 38 (L) 04/16/2023   LDLCALC 61 04/16/2023   TRIG 66  04/16/2023   CHOLHDL 2.9 04/16/2023    Medications Reviewed Today     Reviewed  by Cecilie Coffee, RPH-CPP (Pharmacist) on 07/11/23 at 1505  Med List Status: <None>   Medication Order Taking? Sig Documenting Provider Last Dose Status Informant  acetaminophen  (TYLENOL ) 500 MG tablet [id number] Yes Take 500 mg by mouth in the morning, at noon, and at bedtime. [provider] Taking Active   albuterol  (PROVENTIL ) (2.5 MG/3ML) 0.083% nebulizer solution [id number] Yes TAKE 3 MLS BY NEBULIZER EVERY 6 HOURS AS NEEDED FOR WHEEZING FOR SHORTNESS OF Lewie Rector, NP Taking Active Self, Pharmacy Records  amiodarone  (PACERONE ) 200 MG tablet [id number] Yes Take 1 tablet (200 mg total) by mouth daily. Ruddy Corral M, PA-C Taking Active   amoxicillin -clavulanate (AUGMENTIN ) 875-125 MG tablet [id number] Yes Take 1 tablet by mouth 2 (two) times daily. Dorrene Gaucher, NP Taking Active   apixaban  (ELIQUIS ) 5 MG TABS tablet [id number] Yes Take 1 tablet by mouth twice daily O'Sullivan, [name], NP Taking Active   fluticasone -salmeterol (ADVAIR) 500-50 MCG/ACT AEPB [id number] Yes Inhale 1 puff into the lungs in the morning and at bedtime. [provider]  Active   gabapentin  (NEURONTIN ) 300 MG capsule [id number] Yes Take 1 capsule (300 mg total) by mouth at bedtime. Dorrene Gaucher, NP Taking Active Self, Pharmacy Records  leflunomide  (ARAVA ) 20 MG tablet [id number] Yes Take 1 tablet (20 mg total) by mouth daily. Dorrene Gaucher, NP Taking Active   montelukast  (SINGULAIR ) 10 MG tablet [id number] Yes Take 10 mg by mouth at bedtime. For asthma [provider] Taking Active   rosuvastatin  (CRESTOR ) 10 MG tablet [id number] Yes Take 1 tablet (10 mg total) by mouth daily. Dorrene Gaucher, NP Taking Active   tamsulosin  (FLOMAX ) 0.4 MG CAPS capsule [id number] Yes Take 0.4 mg by mouth daily. [provider] Taking Active   torsemide  (DEMADEX ) 20 MG tablet [id number]  Yes Take 2 tablets (40 mg total) by mouth daily. Horace Lye, PA-C Taking Active   Med List Note Louvenia Roys, CPhT 05/04/23 0405): [facility] [phone]              Assessment/Plan:   {Pharmacy A/P Choices:26421}  Follow Up Plan: ***  ***

## 2023-07-12 ENCOUNTER — Other Ambulatory Visit: Payer: Self-pay | Admitting: Licensed Clinical Social Worker

## 2023-07-12 DIAGNOSIS — J449 Chronic obstructive pulmonary disease, unspecified: Secondary | ICD-10-CM | POA: Diagnosis not present

## 2023-07-12 DIAGNOSIS — R5381 Other malaise: Secondary | ICD-10-CM | POA: Diagnosis not present

## 2023-07-12 DIAGNOSIS — G4733 Obstructive sleep apnea (adult) (pediatric): Secondary | ICD-10-CM | POA: Diagnosis not present

## 2023-07-12 DIAGNOSIS — M6281 Muscle weakness (generalized): Secondary | ICD-10-CM | POA: Diagnosis not present

## 2023-07-12 DIAGNOSIS — Z87891 Personal history of nicotine dependence: Secondary | ICD-10-CM | POA: Diagnosis not present

## 2023-07-12 DIAGNOSIS — J45909 Unspecified asthma, uncomplicated: Secondary | ICD-10-CM | POA: Diagnosis not present

## 2023-07-12 DIAGNOSIS — I272 Pulmonary hypertension, unspecified: Secondary | ICD-10-CM | POA: Diagnosis not present

## 2023-07-12 DIAGNOSIS — I5020 Unspecified systolic (congestive) heart failure: Secondary | ICD-10-CM | POA: Diagnosis not present

## 2023-07-12 DIAGNOSIS — I480 Paroxysmal atrial fibrillation: Secondary | ICD-10-CM | POA: Diagnosis not present

## 2023-07-12 DIAGNOSIS — I130 Hypertensive heart and chronic kidney disease with heart failure and stage 1 through stage 4 chronic kidney disease, or unspecified chronic kidney disease: Secondary | ICD-10-CM | POA: Diagnosis not present

## 2023-07-12 DIAGNOSIS — Z7901 Long term (current) use of anticoagulants: Secondary | ICD-10-CM | POA: Diagnosis not present

## 2023-07-12 DIAGNOSIS — Z8673 Personal history of transient ischemic attack (TIA), and cerebral infarction without residual deficits: Secondary | ICD-10-CM | POA: Diagnosis not present

## 2023-07-12 DIAGNOSIS — I714 Abdominal aortic aneurysm, without rupture, unspecified: Secondary | ICD-10-CM | POA: Diagnosis not present

## 2023-07-12 DIAGNOSIS — D631 Anemia in chronic kidney disease: Secondary | ICD-10-CM | POA: Diagnosis not present

## 2023-07-12 DIAGNOSIS — I429 Cardiomyopathy, unspecified: Secondary | ICD-10-CM | POA: Diagnosis not present

## 2023-07-12 DIAGNOSIS — N184 Chronic kidney disease, stage 4 (severe): Secondary | ICD-10-CM | POA: Diagnosis not present

## 2023-07-12 NOTE — Patient Instructions (Signed)
 Visit Information  Thank you for taking time to visit with me today. Please don't hesitate to contact me if I can be of assistance to you before our next scheduled appointment.  Your next care management appointment is by telephone on 08/02/2023 at 2:00 pm   Please call the care guide team at [phone] if you need to cancel, schedule, or reschedule an appointment.   Please call the Suicide and Crisis Lifeline: 988 go to [facility] Urgent [facility] [address], [place] [phone]) call 911 if you are experiencing a Mental Health or Behavioral Health Crisis or need someone to talk to.  Jonda Neighbours, PhD [facility], [facility] Social Worker Direct Dial: [phone]  Fax: [phone]

## 2023-07-12 NOTE — Patient Outreach (Signed)
 Complex Care Management   Visit Note  07/12/2023  Name:  Brandon [name] MRN: [id number] DOB: [birth year]  Situation: Referral received for Complex Care Management related to SDOH Barriers:  MOW's  I obtained verbal consent from Patient.  Visit completed with patient  on the phone  Background:   Past Medical History:  Diagnosis Date   Anemia    Arm DVT (deep venous thromboembolism), acute, right (HCC)    Asthma    Hx of childhood asthma, disappeared for a while, then resurfaced 6-7 years ago.    Cardiomyopathy    with a negative cardiac catheterization in the past. 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     Assessment: Patientwas referred to MOW's about 2 weeks ago by the SW, SW will follow up to check the status and Sw did let the patietn know that is was a wait listy   Recommendation:   Follow up on MOW referral   Follow Up Plan:   Telephone follow up appointment date/time:  08/02/2023 at 2:00 pm  Jonda Neighbours, PhD [facility], [facility] Social Worker Direct Dial: [phone]  Fax: [phone]

## 2023-07-14 DIAGNOSIS — M6281 Muscle weakness (generalized): Secondary | ICD-10-CM | POA: Diagnosis not present

## 2023-07-14 DIAGNOSIS — Z8673 Personal history of transient ischemic attack (TIA), and cerebral infarction without residual deficits: Secondary | ICD-10-CM | POA: Diagnosis not present

## 2023-07-14 DIAGNOSIS — Z7901 Long term (current) use of anticoagulants: Secondary | ICD-10-CM | POA: Diagnosis not present

## 2023-07-14 DIAGNOSIS — I714 Abdominal aortic aneurysm, without rupture, unspecified: Secondary | ICD-10-CM | POA: Diagnosis not present

## 2023-07-14 DIAGNOSIS — Z87891 Personal history of nicotine dependence: Secondary | ICD-10-CM | POA: Diagnosis not present

## 2023-07-14 DIAGNOSIS — D631 Anemia in chronic kidney disease: Secondary | ICD-10-CM | POA: Diagnosis not present

## 2023-07-14 DIAGNOSIS — I272 Pulmonary hypertension, unspecified: Secondary | ICD-10-CM | POA: Diagnosis not present

## 2023-07-14 DIAGNOSIS — G4733 Obstructive sleep apnea (adult) (pediatric): Secondary | ICD-10-CM | POA: Diagnosis not present

## 2023-07-14 DIAGNOSIS — N184 Chronic kidney disease, stage 4 (severe): Secondary | ICD-10-CM | POA: Diagnosis not present

## 2023-07-14 DIAGNOSIS — I5020 Unspecified systolic (congestive) heart failure: Secondary | ICD-10-CM | POA: Diagnosis not present

## 2023-07-14 DIAGNOSIS — I429 Cardiomyopathy, unspecified: Secondary | ICD-10-CM | POA: Diagnosis not present

## 2023-07-14 DIAGNOSIS — J45909 Unspecified asthma, uncomplicated: Secondary | ICD-10-CM | POA: Diagnosis not present

## 2023-07-14 DIAGNOSIS — R5381 Other malaise: Secondary | ICD-10-CM | POA: Diagnosis not present

## 2023-07-14 DIAGNOSIS — I130 Hypertensive heart and chronic kidney disease with heart failure and stage 1 through stage 4 chronic kidney disease, or unspecified chronic kidney disease: Secondary | ICD-10-CM | POA: Diagnosis not present

## 2023-07-14 DIAGNOSIS — J449 Chronic obstructive pulmonary disease, unspecified: Secondary | ICD-10-CM | POA: Diagnosis not present

## 2023-07-14 DIAGNOSIS — I480 Paroxysmal atrial fibrillation: Secondary | ICD-10-CM | POA: Diagnosis not present

## 2023-07-21 ENCOUNTER — Telehealth: Payer: Self-pay

## 2023-07-21 DIAGNOSIS — I272 Pulmonary hypertension, unspecified: Secondary | ICD-10-CM | POA: Diagnosis not present

## 2023-07-21 DIAGNOSIS — I130 Hypertensive heart and chronic kidney disease with heart failure and stage 1 through stage 4 chronic kidney disease, or unspecified chronic kidney disease: Secondary | ICD-10-CM | POA: Diagnosis not present

## 2023-07-21 DIAGNOSIS — Z87891 Personal history of nicotine dependence: Secondary | ICD-10-CM | POA: Diagnosis not present

## 2023-07-21 DIAGNOSIS — J449 Chronic obstructive pulmonary disease, unspecified: Secondary | ICD-10-CM | POA: Diagnosis not present

## 2023-07-21 DIAGNOSIS — I5020 Unspecified systolic (congestive) heart failure: Secondary | ICD-10-CM | POA: Diagnosis not present

## 2023-07-21 DIAGNOSIS — I714 Abdominal aortic aneurysm, without rupture, unspecified: Secondary | ICD-10-CM | POA: Diagnosis not present

## 2023-07-21 DIAGNOSIS — I429 Cardiomyopathy, unspecified: Secondary | ICD-10-CM | POA: Diagnosis not present

## 2023-07-21 DIAGNOSIS — G4733 Obstructive sleep apnea (adult) (pediatric): Secondary | ICD-10-CM | POA: Diagnosis not present

## 2023-07-21 DIAGNOSIS — I480 Paroxysmal atrial fibrillation: Secondary | ICD-10-CM | POA: Diagnosis not present

## 2023-07-21 DIAGNOSIS — R5381 Other malaise: Secondary | ICD-10-CM | POA: Diagnosis not present

## 2023-07-21 DIAGNOSIS — D631 Anemia in chronic kidney disease: Secondary | ICD-10-CM | POA: Diagnosis not present

## 2023-07-21 DIAGNOSIS — Z8673 Personal history of transient ischemic attack (TIA), and cerebral infarction without residual deficits: Secondary | ICD-10-CM | POA: Diagnosis not present

## 2023-07-21 DIAGNOSIS — Z7901 Long term (current) use of anticoagulants: Secondary | ICD-10-CM | POA: Diagnosis not present

## 2023-07-21 DIAGNOSIS — J45909 Unspecified asthma, uncomplicated: Secondary | ICD-10-CM | POA: Diagnosis not present

## 2023-07-21 DIAGNOSIS — N184 Chronic kidney disease, stage 4 (severe): Secondary | ICD-10-CM | POA: Diagnosis not present

## 2023-07-21 DIAGNOSIS — M6281 Muscle weakness (generalized): Secondary | ICD-10-CM | POA: Diagnosis not present

## 2023-07-21 NOTE — Telephone Encounter (Signed)
 OK to approve order for PT.

## 2023-07-21 NOTE — Telephone Encounter (Signed)
 Copied from CRM [id number]. Topic: Clinical - Home Health Verbal Orders >> Jul 21, 2023  3:53 PM Felizardo Hotter wrote: Caller/Agency:  [facility] per Marikay Show    Callback Number: ph: [phone]  Service Requested: Physical Therapy Frequency: 1 w 8 Any new concerns about the patient? Yes

## 2023-07-22 ENCOUNTER — Telehealth: Payer: Self-pay

## 2023-07-22 MED ORDER — TORSEMIDE 20 MG PO TABS: 50.0000 mg | ORAL_TABLET | Freq: Every day | ORAL | Status: DC

## 2023-07-22 NOTE — Addendum Note (Signed)
 Addended by: Dorrene Gaucher on: 07/22/2023 03:46 PM   Modules accepted: Orders

## 2023-07-22 NOTE — Telephone Encounter (Signed)
 Copied from CRM [id number]. Topic: Clinical - Medical Advice >> Jul 22, 2023 10:29 AM Dimple Francis wrote: Reason for CRM: Patient had a 6 pound weight gain please Alyssa Backbone [phone] she is with [facility]  Just an FYI

## 2023-07-22 NOTE — Telephone Encounter (Signed)
 Spoke with pt. He notes increased LE especially left ankle.  Denies sob.  Recommended that he increase demadex  from 40mg  to 50mg . Left message for RN at [facility] letting her know plan and requesting that she call me with a follow up weight check next week.

## 2023-07-22 NOTE — Telephone Encounter (Signed)
 Call Shirl Dose w/ [facility] to advise ok to move forward with PT and no answer left vm to return call.

## 2023-07-25 ENCOUNTER — Other Ambulatory Visit: Payer: Self-pay

## 2023-07-25 NOTE — Telephone Encounter (Signed)
 Verbal orders given to Rice Chamorro with [facility]

## 2023-07-25 NOTE — Patient Outreach (Signed)
 Complex Care Management   Visit Note  07/25/2023  Name:  Brandon [name] MRN: [id number] DOB: 01/11/1941  Situation: Referral received for Complex Care Management related to Heart Failure, Chronic Kidney Disease, and HTN I obtained verbal consent from Patient.  Visit completed with patient  on the phone  Background:   Past Medical History:  Diagnosis Date   Anemia    Arm DVT (deep venous thromboembolism), acute, right (HCC)    Asthma    Hx of childhood asthma, disappeared for a while, then resurfaced 6-7 years ago.    Cardiomyopathy    with a negative cardiac catheterization in the past. 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     Assessment: Patient Reported Symptoms:  Cognitive Cognitive Status: Normal speech and language skills      Neurological Neurological Review of Symptoms: No symptoms reported    HEENT HEENT Symptoms Reported: No symptoms reported      Cardiovascular Cardiovascular Symptoms Reported: No symptoms reported, Swelling in legs or feet (recent increase in weight has lessened, swelling decreased with diuretic dosage increase to 50 mg/day) Cardiovascular Conditions: Hypertension, Heart failure Cardiovascular Management Strategies: Routine screening, Weight management, Medication therapy, Fluid modification, Diet modification, Adequate rest, Exercise Weight: 172 lb (78 kg) Cardiovascular Self-Management Outcome: 3 (uncertain)  Respiratory Respiratory Symptoms Reported: No symptoms reported Respiratory Conditions: Sleep disordered breathing (hasnt needed CPAP since hospitalization in february, no longer snoring) Respiratory Self-Management Outcome: 4 (good)  Endocrine Patient reports the following symptoms related to hypoglycemia or hyperglycemia : No  symptoms reported    Gastrointestinal Gastrointestinal Symptoms Reported: No symptoms reported   Nutrition Risk Screen (CP): No indicators present  Genitourinary Genitourinary Symptoms Reported: No symptoms reported    Integumentary Additional Integumentary Details: on Eliquis , careful monitoring for bleeding concerns Skin Management Strategies: Routine screening, Medication therapy  Musculoskeletal Musculoskelatal Symptoms Reviewed: Difficulty walking, Unsteady gait Additional Musculoskeletal Details: in treatment with [facility] PT/OT expect to be discharged within a few weeks Musculoskeletal Conditions: Other Other Musculoskeletal Conditions: recent CHF exacerbation, hospitalization and SNF stay Musculoskeletal Management Strategies: Adequate rest, Routine screening, Exercise, Coping strategies Musculoskeletal Self-Management Outcome: 4 (good) Falls in the past year?: Yes Number of falls in past year: 2 or more (last fall over a month ago) Was there an injury with Fall?: No Fall Risk Category Calculator: 2 Patient Fall Risk Level: Moderate Fall Risk Patient at Risk for Falls Due to: History of fall(s), Impaired balance/gait  Psychosocial Psychosocial Symptoms Reported: No symptoms reported     Quality of Family Relationships: involved, helpful, supportive Do you feel physically threatened by others?: No      02/03/2023    1:16 PM  Depression screen PHQ 2/9  Decreased Interest 0  Down, Depressed, Hopeless 0  PHQ - 2 Score 0    There were no vitals filed for this visit.  Medications Reviewed Today     Reviewed by Clarnce Crow, RN (Registered Nurse) on 07/25/23 at 1416  Med List Status: <None>   Medication Order Taking? Sig Documenting Provider Last Dose Status Informant  acetaminophen  (TYLENOL ) 500 MG tablet [id number] Yes Take 500 mg by mouth in the morning, at noon, and at bedtime. [provider] Taking Active  Med Note Burley Carpenter, Brode Sculley   Mon Jul 25, 2023   2:14 PM) Takes one in the morning and again at bedtime  albuterol  (PROVENTIL ) (2.5 MG/3ML) 0.083% nebulizer solution [id number] Yes TAKE 3 MLS BY NEBULIZER EVERY 6 HOURS AS NEEDED FOR WHEEZING FOR SHORTNESS OF Lewie Rector, NP Taking Active Self, Pharmacy Records  amiodarone  (PACERONE ) 200 MG tablet [id number] Yes Take 1 tablet (200 mg total) by mouth daily. Ruddy Corral M, PA-C Taking Active   amoxicillin -clavulanate (AUGMENTIN ) 875-125 MG tablet [id number] No Take 1 tablet by mouth 2 (two) times daily.  Patient not taking: Reported on 07/25/2023   Dorrene Gaucher, NP Not Taking Active            Med Note Burley Carpenter, Jolyssa Oplinger   Mon Jul 25, 2023  2:15 PM) Patient completed course  apixaban  (ELIQUIS ) 5 MG TABS tablet [id number] Yes Take 1 tablet by mouth twice daily O'Sullivan, [name], NP Taking Active   fluticasone -salmeterol (ADVAIR) 500-50 MCG/ACT AEPB [id number] Yes Inhale 1 puff into the lungs in the morning and at bedtime. [provider] Taking Active   gabapentin  (NEURONTIN ) 300 MG capsule [id number] Yes Take 1 capsule (300 mg total) by mouth at bedtime. Dorrene Gaucher, NP Taking Active Self, Pharmacy Records  leflunomide  (ARAVA ) 20 MG tablet [id number] Yes Take 1 tablet (20 mg total) by mouth daily. Dorrene Gaucher, NP Taking Active   montelukast  (SINGULAIR ) 10 MG tablet [id number] Yes Take 10 mg by mouth at bedtime. For asthma [provider] Taking Active   rosuvastatin  (CRESTOR ) 10 MG tablet [id number] Yes Take 1 tablet (10 mg total) by mouth daily. Dorrene Gaucher, NP Taking Active   tamsulosin  (FLOMAX ) 0.4 MG CAPS capsule [id number] Yes Take 0.4 mg by mouth daily. [provider] Taking Active   torsemide  (DEMADEX ) 20 MG tablet [id number] Yes Take 2.5 tablets (50 mg total) by mouth daily. Dorrene Gaucher, NP Taking Active   Med List Note Louvenia Roys, CPhT 05/04/23 0405): [facility] [phone]             Recommendation:   PCP Follow-up  Follow Up Plan:   Telephone follow up appointment date/time:  08/04/23 at 1:00   Clarnce Crow BSN RN CCM [place]  [facility], [facility] RN Care Manager Direct Dial: [phone] Fax: [phone]

## 2023-07-25 NOTE — Patient Instructions (Signed)
 Visit Information  Thank you for taking time to visit with me today. Please don't hesitate to contact me if I can be of assistance to you before our next scheduled appointment.  Our next appointment is by telephone on 08/04/23 at 1:00 Please call the care guide team at [phone] if you need to cancel or reschedule your appointment.   Following is a copy of your care plan:   Goals Addressed             This Visit's Progress    VBCI RN Care Plan       Problems:  Chronic Disease Management support and education needs related to CHF, CKD Stage 4, and HTN  Goal: Over the next 30 days the Patient will attend all scheduled medical appointments: 08/02/23 SW, 08/10/23 PCP, 08/15/33 CVD, 08/22/23 Pharmacist, 09/01/23 Nephrology as evidenced by chart review and patient report        continue to work with RN Care Manager and/or Social Worker to address care management and care coordination needs related to CHF, CKD Stage 4, and HTN as evidenced by adherence to care management team scheduled appointments     demonstrate a decrease CHF, CKD Stage 4, and HTN in exacerbations as evidenced by chart review and patient report demonstrate Improved adherence to prescribed treatment plan for CHF, CKD Stage 4, and HTN as evidenced by attending all medical appointments, taking all medications and adherence to diet and lifestyle changes not experience hospital admission as evidenced by review of electronic medical record. Hospital Admissions in last 6 months = 2 take all medications exactly as prescribed and will call provider for medication related questions as evidenced by chart review and patient report     Interventions:   Heart Failure Interventions: Basic overview and discussion of pathophysiology of Heart Failure reviewed Provided education on low sodium diet Assessed need for readable accurate scales in home Provided education about placing scale on hard, flat surface Advised patient to weigh each  morning after emptying bladder Discussed importance of daily weight and advised patient to weigh and record daily Reviewed role of diuretics in prevention of fluid overload and management of heart failure; Discussed the importance of keeping all appointments with provider Provided patient with education about the role of exercise in the management of heart failure Screening for signs and symptoms of depression related to chronic disease state    Chronic Kidney Disease Interventions: Evaluation of current treatment plan related to chronic kidney disease self management and patient's adherence to plan as established by provider      Provided education to patient re: stroke prevention, s/s of heart attack and stroke    Reviewed prescribed diet low salt heart healthy Reviewed medications with patient and discussed importance of compliance    Advised patient, providing education and rationale, to monitor blood pressure daily and record, calling PCP for findings outside established parameters    Discussed complications of poorly controlled blood pressure such as heart disease, stroke, circulatory complications, vision complications, kidney impairment, sexual dysfunction    Discussed plans with patient for ongoing care management follow up and provided patient with direct contact information for care management team    Screening for signs and symptoms of depression related to chronic disease state      Assessed social determinant of health barriers    Last practice recorded BP readings:  BP Readings from Last 3 Encounters:  07/11/23 122/70  06/09/23 121/70  05/09/23 114/74   Most recent eGFR/CrCl:  Lab Results  Component Value Date   EGFR 35.0 03/31/2023    No components found for: "CRCL"    Hypertension Interventions: Last practice recorded BP readings:  BP Readings from Last 3 Encounters:  07/11/23 122/70  06/09/23 121/70  05/09/23 114/74   Most recent eGFR/CrCl:  Lab Results   Component Value Date   EGFR 35.0 03/31/2023    No components found for: "CRCL"  Evaluation of current treatment plan related to hypertension self management and patient's adherence to plan as established by provider Provided education to patient re: stroke prevention, s/s of heart attack and stroke Reviewed medications with patient and discussed importance of compliance  Patient Self-Care Activities:  Attend all scheduled provider appointments Attend church or other social activities Call pharmacy for medication refills 3-7 days in advance of running out of medications Call provider office for new concerns or questions  Perform all self care activities independently  Take medications as prescribed   Work with the social worker to address care coordination needs and will continue to work with the clinical team to address health care and disease management related needs Work with the pharmacist to address medication management needs and will continue to work with the clinical team to address health care and disease management related needs check blood pressure daily choose a place to take my blood pressure (home, clinic or office, retail store) keep a blood pressure log take blood pressure log to all doctor appointments call doctor for signs and symptoms of high blood pressure keep all doctor appointments take medications for blood pressure exactly as prescribed  Plan:  Telephone follow up appointment with care management team member scheduled for:  08/04/23 at 1:00             Please call the Suicide and Crisis Lifeline: 988 call the [place]  National Suicide Prevention Lifeline: 830-653-3535 or TTY: (801) 431-4564 TTY (405)826-8546) to talk to a trained counselor call 1-800-273-TALK (toll free, 24 hour hotline) if you are experiencing a Mental Health or Behavioral Health Crisis or need someone to talk to.  Patient verbalizes understanding of instructions and care plan provided  today and agrees to view in MyChart. Active MyChart status and patient understanding of how to access instructions and care plan via MyChart confirmed with patient.      Clarnce Crow BSN RN CCM [place]  [facility], [facility] RN Care Manager Direct Dial: [phone] Fax: [phone]

## 2023-07-27 DIAGNOSIS — I480 Paroxysmal atrial fibrillation: Secondary | ICD-10-CM | POA: Diagnosis not present

## 2023-07-27 DIAGNOSIS — I5020 Unspecified systolic (congestive) heart failure: Secondary | ICD-10-CM | POA: Diagnosis not present

## 2023-07-27 DIAGNOSIS — I130 Hypertensive heart and chronic kidney disease with heart failure and stage 1 through stage 4 chronic kidney disease, or unspecified chronic kidney disease: Secondary | ICD-10-CM | POA: Diagnosis not present

## 2023-07-27 DIAGNOSIS — I429 Cardiomyopathy, unspecified: Secondary | ICD-10-CM | POA: Diagnosis not present

## 2023-07-27 DIAGNOSIS — Z8673 Personal history of transient ischemic attack (TIA), and cerebral infarction without residual deficits: Secondary | ICD-10-CM | POA: Diagnosis not present

## 2023-07-27 DIAGNOSIS — N184 Chronic kidney disease, stage 4 (severe): Secondary | ICD-10-CM | POA: Diagnosis not present

## 2023-07-27 DIAGNOSIS — J449 Chronic obstructive pulmonary disease, unspecified: Secondary | ICD-10-CM | POA: Diagnosis not present

## 2023-07-27 DIAGNOSIS — Z87891 Personal history of nicotine dependence: Secondary | ICD-10-CM | POA: Diagnosis not present

## 2023-07-27 DIAGNOSIS — D631 Anemia in chronic kidney disease: Secondary | ICD-10-CM | POA: Diagnosis not present

## 2023-07-27 DIAGNOSIS — Z7901 Long term (current) use of anticoagulants: Secondary | ICD-10-CM | POA: Diagnosis not present

## 2023-07-27 DIAGNOSIS — I272 Pulmonary hypertension, unspecified: Secondary | ICD-10-CM | POA: Diagnosis not present

## 2023-07-27 DIAGNOSIS — R5381 Other malaise: Secondary | ICD-10-CM | POA: Diagnosis not present

## 2023-07-27 DIAGNOSIS — G4733 Obstructive sleep apnea (adult) (pediatric): Secondary | ICD-10-CM | POA: Diagnosis not present

## 2023-07-27 DIAGNOSIS — I714 Abdominal aortic aneurysm, without rupture, unspecified: Secondary | ICD-10-CM | POA: Diagnosis not present

## 2023-07-27 DIAGNOSIS — J45909 Unspecified asthma, uncomplicated: Secondary | ICD-10-CM | POA: Diagnosis not present

## 2023-07-27 DIAGNOSIS — M6281 Muscle weakness (generalized): Secondary | ICD-10-CM | POA: Diagnosis not present

## 2023-07-29 ENCOUNTER — Other Ambulatory Visit: Payer: Self-pay | Admitting: Family

## 2023-08-02 ENCOUNTER — Other Ambulatory Visit: Payer: Self-pay | Admitting: Licensed Clinical Social Worker

## 2023-08-02 DIAGNOSIS — D631 Anemia in chronic kidney disease: Secondary | ICD-10-CM | POA: Diagnosis not present

## 2023-08-02 DIAGNOSIS — Z7901 Long term (current) use of anticoagulants: Secondary | ICD-10-CM | POA: Diagnosis not present

## 2023-08-02 DIAGNOSIS — I480 Paroxysmal atrial fibrillation: Secondary | ICD-10-CM | POA: Diagnosis not present

## 2023-08-02 DIAGNOSIS — I130 Hypertensive heart and chronic kidney disease with heart failure and stage 1 through stage 4 chronic kidney disease, or unspecified chronic kidney disease: Secondary | ICD-10-CM | POA: Diagnosis not present

## 2023-08-02 DIAGNOSIS — I429 Cardiomyopathy, unspecified: Secondary | ICD-10-CM | POA: Diagnosis not present

## 2023-08-02 DIAGNOSIS — Z8673 Personal history of transient ischemic attack (TIA), and cerebral infarction without residual deficits: Secondary | ICD-10-CM | POA: Diagnosis not present

## 2023-08-02 DIAGNOSIS — M6281 Muscle weakness (generalized): Secondary | ICD-10-CM | POA: Diagnosis not present

## 2023-08-02 DIAGNOSIS — J449 Chronic obstructive pulmonary disease, unspecified: Secondary | ICD-10-CM | POA: Diagnosis not present

## 2023-08-02 DIAGNOSIS — Z87891 Personal history of nicotine dependence: Secondary | ICD-10-CM | POA: Diagnosis not present

## 2023-08-02 DIAGNOSIS — N184 Chronic kidney disease, stage 4 (severe): Secondary | ICD-10-CM | POA: Diagnosis not present

## 2023-08-02 DIAGNOSIS — I272 Pulmonary hypertension, unspecified: Secondary | ICD-10-CM | POA: Diagnosis not present

## 2023-08-02 DIAGNOSIS — R5381 Other malaise: Secondary | ICD-10-CM | POA: Diagnosis not present

## 2023-08-02 DIAGNOSIS — I5020 Unspecified systolic (congestive) heart failure: Secondary | ICD-10-CM | POA: Diagnosis not present

## 2023-08-02 DIAGNOSIS — G4733 Obstructive sleep apnea (adult) (pediatric): Secondary | ICD-10-CM | POA: Diagnosis not present

## 2023-08-02 DIAGNOSIS — I714 Abdominal aortic aneurysm, without rupture, unspecified: Secondary | ICD-10-CM | POA: Diagnosis not present

## 2023-08-02 DIAGNOSIS — J45909 Unspecified asthma, uncomplicated: Secondary | ICD-10-CM | POA: Diagnosis not present

## 2023-08-02 NOTE — Patient Instructions (Signed)

## 2023-08-02 NOTE — Patient Outreach (Signed)
 Complex Care Management   Visit Note  08/02/2023  Name:  Brandon [name] MRN: [id number] DOB: 09-Apr-1940  Situation: Referral received for Complex Care Management related to [facility] [employer] [facility] I obtained verbal consent from Patient.  Visit completed with Patient  on the phone  Background:   Past Medical History:  Diagnosis Date   Anemia    Arm DVT (deep venous thromboembolism), acute, right (HCC)    Asthma    Hx of childhood asthma, disappeared for a while, then resurfaced 6-7 years ago.    Cardiomyopathy    with a negative cardiac catheterization in the past. 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     Assessment: Patient is on the [facility] list and is aware that there is a wait list. Pt is currently at the home of his daughter an receiving in-home PT and may be discharged after a couple of sessions and then will return to his home in [place] with his spouse.    SDOH Interventions    Flowsheet Row Patient Outreach from 08/02/2023 in [place] POPULATION HEALTH DEPARTMENT Patient Outreach Telephone from 07/25/2023 in [place] POPULATION HEALTH DEPARTMENT Care Coordination from 06/14/2023 in [facility] Community Care Coordination ED to Hosp-Admission (Discharged) from 04/15/2023 in [facility] 3E HF PCU Care Coordination from 03/02/2023 in [facility] Community Care Coordination Patient Outreach from 02/03/2023 in [place] POPULATION HEALTH DEPARTMENT  SDOH Interventions        Food Insecurity Interventions Intervention Not Indicated Intervention Not Indicated Community Resources Provided  [SW will make a referral for MOW, Patient is signed up for MOM's meals through [employer]] Intervention Not Indicated Intervention Not Indicated --  Housing  Interventions Intervention Not Indicated Intervention Not Indicated Intervention Not Indicated Intervention Not Indicated Intervention Not Indicated Intervention Not Indicated  Transportation Interventions Intervention Not Indicated Intervention Not Indicated Intervention Not Indicated Intervention Not Indicated Intervention Not Indicated --  Utilities Interventions Intervention Not Indicated Intervention Not Indicated Intervention Not Indicated Intervention Not Indicated Intervention Not Indicated Intervention Not Indicated  Financial Strain Interventions Intervention Not Indicated -- -- -- -- --  Social Connections Interventions -- -- -- Intervention Not Indicated -- --       Recommendation:   None  Follow Up Plan:   Closing From:  Complex Care Management  Jonda Neighbours, PhD [facility], [facility] Social Worker Direct Dial: [phone]  Fax: [phone]

## 2023-08-04 ENCOUNTER — Other Ambulatory Visit: Payer: Self-pay

## 2023-08-04 NOTE — Patient Instructions (Signed)
 Visit Information  Thank you for taking time to visit with me today. Please don't hesitate to contact me if I can be of assistance to you before our next scheduled appointment.  Our next appointment is by telephone on 08/22/23 at 1:00 Please call the care guide team at [phone] if you need to cancel or reschedule your appointment.   Following is a copy of your care plan:   Goals Addressed             This Visit's Progress    VBCI RN Care Plan   On track    Problems:  Chronic Disease Management support and education needs related to CHF, CKD Stage 4, and HTN  Goal: Over the next 30 days the Patient will attend all scheduled medical appointments: 08/02/23 SW, 08/10/23 PCP, 08/15/33 CVD, 08/22/23 Pharmacist, 09/01/23 Nephrology as evidenced by chart review and patient report        continue to work with RN Care Manager and/or Social Worker to address care management and care coordination needs related to CHF, CKD Stage 4, and HTN as evidenced by adherence to care management team scheduled appointments     demonstrate a decrease CHF, CKD Stage 4, and HTN in exacerbations as evidenced by chart review and patient report demonstrate Improved adherence to prescribed treatment plan for CHF, CKD Stage 4, and HTN as evidenced by attending all medical appointments, taking all medications and adherence to diet and lifestyle changes not experience hospital admission as evidenced by review of electronic medical record. Hospital Admissions in last 6 months = 2 take all medications exactly as prescribed and will call provider for medication related questions as evidenced by chart review and patient report     Interventions:   Heart Failure Interventions: Basic overview and discussion of pathophysiology of Heart Failure reviewed Provided education on low sodium diet Assessed need for readable accurate scales in home Provided education about placing scale on hard, flat surface Advised patient to weigh  each morning after emptying bladder Discussed importance of daily weight and advised patient to weigh and record daily Reviewed role of diuretics in prevention of fluid overload and management of heart failure; Discussed the importance of keeping all appointments with provider Provided patient with education about the role of exercise in the management of heart failure Screening for signs and symptoms of depression related to chronic disease state    Chronic Kidney Disease Interventions: Evaluation of current treatment plan related to chronic kidney disease self management and patient's adherence to plan as established by provider      Provided education to patient re: stroke prevention, s/s of heart attack and stroke    Reviewed prescribed diet low salt heart healthy Reviewed medications with patient and discussed importance of compliance    Advised patient, providing education and rationale, to monitor blood pressure daily and record, calling PCP for findings outside established parameters    Discussed complications of poorly controlled blood pressure such as heart disease, stroke, circulatory complications, vision complications, kidney impairment, sexual dysfunction    Discussed plans with patient for ongoing care management follow up and provided patient with direct contact information for care management team    Screening for signs and symptoms of depression related to chronic disease state      Assessed social determinant of health barriers         Hypertension Interventions: Last practice recorded BP readings:  BP Readings from Last 3 Encounters:  08/04/23 131/78  07/11/23 122/70  06/09/23 121/70  Most recent eGFR/CrCl:  Lab Results  Component Value Date   EGFR 35.0 03/31/2023    No components found for: "CRCL"  Evaluation of current treatment plan related to hypertension self management and patient's adherence to plan as established by provider Provided education to patient  re: stroke prevention, s/s of heart attack and stroke Reviewed medications with patient and discussed importance of compliance  Patient Self-Care Activities:  Attend all scheduled provider appointments Attend church or other social activities Call pharmacy for medication refills 3-7 days in advance of running out of medications Call provider office for new concerns or questions  Perform all self care activities independently  Take medications as prescribed   Work with the social worker to address care coordination needs and will continue to work with the clinical team to address health care and disease management related needs Work with the pharmacist to address medication management needs and will continue to work with the clinical team to address health care and disease management related needs check blood pressure daily choose a place to take my blood pressure (home, clinic or office, retail store) keep a blood pressure log take blood pressure log to all doctor appointments call doctor for signs and symptoms of high blood pressure keep all doctor appointments take medications for blood pressure exactly as prescribed  Plan:  Telephone follow up appointment with care management team member scheduled for:  08/22/23 at 1:00             Please call the Suicide and Crisis Lifeline: 988 call the [place]  National Suicide Prevention Lifeline: 925-401-3988 or TTY: (682) 016-3856 TTY 234-640-8497) to talk to a trained counselor call 1-800-273-TALK (toll free, 24 hour hotline) if you are experiencing a Mental Health or Behavioral Health Crisis or need someone to talk to.  Patient verbalizes understanding of instructions and care plan provided today and agrees to view in MyChart. Active MyChart status and patient understanding of how to access instructions and care plan via MyChart confirmed with patient.      Clarnce Crow BSN RN CCM [place]  [facility], [facility]  Health RN Care Manager Direct Dial: [phone] Fax: [phone]

## 2023-08-04 NOTE — Patient Outreach (Signed)
 Complex Care Management   Visit Note  08/04/2023  Name:  Brandon [name] MRN: [id number] DOB: 18-Sep-1940  Situation: Referral received for Complex Care Management related to Heart Failure, Chronic Kidney Disease, and HTN I obtained verbal consent from Patient.  Visit completed with patient  on the phone  Background:   Past Medical History:  Diagnosis Date   Anemia    Arm DVT (deep venous thromboembolism), acute, right (HCC)    Asthma    Hx of childhood asthma, disappeared for a while, then resurfaced 6-7 years ago.    Cardiomyopathy    with a negative cardiac catheterization in the past. 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     Assessment: Patient Reported Symptoms:  Cognitive Cognitive Status: Alert and oriented to person, place, and time      Neurological Neurological Review of Symptoms: No symptoms reported    HEENT HEENT Symptoms Reported: No symptoms reported      Cardiovascular Cardiovascular Symptoms Reported: No symptoms reported Does patient have uncontrolled Hypertension?: Yes Is patient checking Blood Pressure at home?: Yes Patient's Recent BP reading at home: 131/78 Cardiovascular Conditions: Heart failure, Hypertension Cardiovascular Management Strategies: Routine screening, Medication therapy, Weight management, Diet modification, Fluid modification Do You Have a Working Readable Scale?: Yes Weight: 174 lb (78.9 kg)  Respiratory Respiratory Symptoms Reported: No symptoms reported Respiratory Conditions: Sleep disordered breathing Respiratory Comment: hasnt needed CPAP for several months.  report no snoring  Endocrine Patient reports the following symptoms related to hypoglycemia or hyperglycemia : No symptoms reported Is patient diabetic?: No     Gastrointestinal Gastrointestinal Symptoms Reported: No symptoms reported      Genitourinary Genitourinary Symptoms Reported: No symptoms reported    Integumentary Integumentary Symptoms Reported: No symptoms reported Skin Management Strategies: Medication therapy  Musculoskeletal Musculoskelatal Symptoms Reviewed: Difficulty walking, Unsteady gait Additional Musculoskeletal Details: patient reports continued improvement, will be discharged once he is able to climb stairs, will return to living at home with spouse.  currently using cane Musculoskeletal Conditions: Mobility limited, Unsteady gait Musculoskeletal Management Strategies: Adequate rest, Medical device, Medication therapy Musculoskeletal Self-Management Outcome: 4 (good) Falls in the past year?: No (no falls since last assessment)    Psychosocial Psychosocial Symptoms Reported: No symptoms reported            02/03/2023    1:16 PM  Depression screen PHQ 2/9  Decreased Interest 0  Down, Depressed, Hopeless 0  PHQ - 2 Score 0    Vitals:   08/04/23 1306  BP: 131/78    Medications Reviewed Today     Reviewed by Clarnce Crow, RN (Registered Nurse) on 08/04/23 at 1309  Med List Status: <None>   Medication Order Taking? Sig Documenting Provider Last Dose Status Informant  acetaminophen  (TYLENOL ) 500 MG tablet [id number] Yes Take 500 mg by mouth in the morning, at noon, and at bedtime. [provider] Taking Active            Med Note Burley Carpenter, Madeleyn Schwimmer   Mon Jul 25, 2023  2:14 PM) Takes one in the morning and again at bedtime  albuterol  (PROVENTIL ) (2.5 MG/3ML) 0.083% nebulizer solution [id number] Yes TAKE 3 MLS BY NEBULIZER EVERY 6 HOURS AS NEEDED FOR WHEEZING FOR SHORTNESS OF  Lewie Rector, NP Taking Active Self, Pharmacy Records  amiodarone  (PACERONE ) 200 MG tablet [id number] Yes Take 1 tablet (200 mg total) by mouth daily. Ruddy Corral M, PA-C Taking Active   amoxicillin -clavulanate  (AUGMENTIN ) 875-125 MG tablet [id number] Yes Take 1 tablet by mouth 2 (two) times daily. Dorrene Gaucher, NP Taking Active            Med Note Burley Carpenter, Kerryann Allaire   Mon Jul 25, 2023  2:15 PM) Patient completed course  apixaban  (ELIQUIS ) 5 MG TABS tablet [id number] Yes Take 1 tablet by mouth twice daily O'Sullivan, [name], NP Taking Active   fluticasone -salmeterol (ADVAIR) 500-50 MCG/ACT AEPB [id number] Yes Inhale 1 puff into the lungs in the morning and at bedtime. [provider] Taking Active   gabapentin  (NEURONTIN ) 300 MG capsule [id number] Yes Take 1 capsule (300 mg total) by mouth at bedtime. Dorrene Gaucher, NP Taking Active Self, Pharmacy Records  leflunomide  (ARAVA ) 20 MG tablet [id number] Yes Take 1 tablet (20 mg total) by mouth daily. Dorrene Gaucher, NP Taking Active   montelukast  (SINGULAIR ) 10 MG tablet [id number] Yes Take 10 mg by mouth at bedtime. For asthma [provider] Taking Active   rosuvastatin  (CRESTOR ) 10 MG tablet [id number] Yes Take 1 tablet (10 mg total) by mouth daily. Dorrene Gaucher, NP Taking Active   tamsulosin  (FLOMAX ) 0.4 MG CAPS capsule [id number] Yes Take 1 capsule by mouth once daily O'Sullivan, [name], NP Taking Active   torsemide  (DEMADEX ) 20 MG tablet [id number] Yes Take 2.5 tablets (50 mg total) by mouth daily. Dorrene Gaucher, NP Taking Active   Med List Note Louvenia Roys, CPhT 05/04/23 0405): [facility] [phone]            Recommendation:   PCP Follow-up  Follow Up Plan:   Telephone follow up appointment date/time:  08/22/23 at 1:00   Clarnce Crow BSN RN CCM [place]  [facility], [facility] Health RN Care Manager Direct Dial: [phone] Fax: [phone]

## 2023-08-09 ENCOUNTER — Ambulatory Visit: Admitting: Family

## 2023-08-09 ENCOUNTER — Ambulatory Visit (INDEPENDENT_AMBULATORY_CARE_PROVIDER_SITE_OTHER)

## 2023-08-09 VITALS — Ht 71.0 in | Wt 174.0 lb

## 2023-08-09 DIAGNOSIS — Z0000 Encounter for general adult medical examination without abnormal findings: Secondary | ICD-10-CM | POA: Diagnosis not present

## 2023-08-09 NOTE — Patient Instructions (Signed)
 [name] , Thank you for taking time out of your busy schedule to complete your Annual Wellness Visit with me. I enjoyed our conversation and look forward to speaking with you again next year. I, as well as your care team,  appreciate your ongoing commitment to your health goals. Please review the following plan we discussed and let me know if I can assist you in the future. Your Game plan/ To Do List    Follow up Visits: Next Medicare AWV with our clinical staff: In 1 year    Have you seen your provider in the last 6 months (3 months if uncontrolled diabetes)? Yes Next Office Visit with your provider: 08/10/23 @ 2:20  Clinician Recommendations:  Aim for 30 minutes of exercise or brisk walking, 6-8 glasses of water, and 5 servings of fruits and vegetables each day.       This is a list of the screening recommended for you and due dates:  Health Maintenance  Topic Date Due   Zoster (Shingles) Vaccine (2 of 2) 06/10/2021   COVID-19 Vaccine (7 - 2024-25 season) 11/14/2022   Flu Shot  10/14/2023   Medicare Annual Wellness Visit  08/08/2024   DTaP/Tdap/Td vaccine (4 - Td or Tdap) 05/24/2027   Pneumonia Vaccine  Completed   HPV Vaccine  Aged Out   Meningitis B Vaccine  Aged Out   Hepatitis C Screening  Discontinued    Advanced directives: (In Chart) A copy of your advanced directives are scanned into your chart should your provider ever need it.  Advance Care Planning is important because it:  [x]  Makes sure you receive the medical care that is consistent with your values, goals, and preferences  [x]  It provides guidance to your family and loved ones and reduces their decisional burden about whether or not they are making the right decisions based on your wishes.  Follow the link provided in your after visit summary or read over the paperwork we have mailed to you to help you started getting your Advance Directives in place. If you need assistance in completing these, please reach out to  us  so that we can help you!  See attachments for Preventive Care and Fall Prevention Tips.

## 2023-08-09 NOTE — Progress Notes (Signed)
 Subjective:   Brandon [name] is a 83 y.o. who presents for a Medicare Wellness preventive visit.  As a reminder, Annual Wellness Visits don't include a physical exam, and some assessments may be limited, especially if this visit is performed virtually. We may recommend an in-person follow-up visit with your provider if needed.  Visit Complete: Virtual I connected with  Brandon [name] on 08/09/23 by a audio enabled telemedicine application and verified that I am speaking with the correct person using two identifiers.  Patient Location: Home  Provider Location: Home Office  I discussed the limitations of evaluation and management by telemedicine. The patient expressed understanding and agreed to proceed.  Vital Signs: Because this visit was a virtual/telehealth visit, some criteria may be missing or patient reported. Any vitals not documented were not able to be obtained and vitals that have been documented are patient reported.  VideoDeclined- This patient declined Librarian, academic. Therefore the visit was completed with audio only.  Persons Participating in Visit: Patient.  AWV Questionnaire: No: Patient Medicare AWV questionnaire was not completed prior to this visit.  Cardiac Risk Factors include: advanced age (>72men, >41 women);dyslipidemia;male gender;hypertension     Objective:     Today's Vitals   08/09/23 1635  Weight: 174 lb (78.9 kg)  Height: 5\' 11"  (1.803 m)   Body mass index is 24.27 kg/m.     08/09/2023    4:39 PM 05/04/2023    5:07 PM 05/04/2023    5:01 PM 05/03/2023    7:36 PM 04/19/2023   11:38 AM 04/17/2023    7:34 PM 04/15/2023    4:10 PM  Advanced Directives  Does Patient Have a Medical Advance Directive? Yes  No Yes No  No  Type of Estate agent of [place];Living will        Does patient want to make changes to medical advance directive? No - Patient declined        Copy of Healthcare Power of  Attorney in Chart? Yes - validated most recent copy scanned in chart (See row information)        Would patient like information on creating a medical advance directive?  No - Patient declined    No - Guardian declined     Current Medications (verified) Outpatient Encounter Medications as of 08/09/2023  Medication Sig   acetaminophen  (TYLENOL ) 500 MG tablet Take 500 mg by mouth in the morning, at noon, and at bedtime.   albuterol  (PROVENTIL ) (2.5 MG/3ML) 0.083% nebulizer solution TAKE 3 MLS BY NEBULIZER EVERY 6 HOURS AS NEEDED FOR WHEEZING FOR SHORTNESS OF BREATH   amiodarone  (PACERONE ) 200 MG tablet Take 1 tablet (200 mg total) by mouth daily.   amoxicillin -clavulanate (AUGMENTIN ) 875-125 MG tablet Take 1 tablet by mouth 2 (two) times daily. (Patient not taking: Reported on 08/09/2023)   apixaban  (ELIQUIS ) 5 MG TABS tablet Take 1 tablet by mouth twice daily   fluticasone -salmeterol (ADVAIR) 500-50 MCG/ACT AEPB Inhale 1 puff into the lungs in the morning and at bedtime.   gabapentin  (NEURONTIN ) 300 MG capsule Take 1 capsule (300 mg total) by mouth at bedtime.   leflunomide  (ARAVA ) 20 MG tablet Take 1 tablet (20 mg total) by mouth daily.   montelukast  (SINGULAIR ) 10 MG tablet Take 10 mg by mouth at bedtime. For asthma   rosuvastatin  (CRESTOR ) 10 MG tablet Take 1 tablet (10 mg total) by mouth daily.   tamsulosin  (FLOMAX ) 0.4 MG CAPS capsule Take 1 capsule by mouth  once daily   torsemide  (DEMADEX ) 20 MG tablet Take 2.5 tablets (50 mg total) by mouth daily.   No facility-administered encounter medications on file as of 08/09/2023.    Allergies (verified) Ace inhibitors and Isosorb dinitrate-hydralazine   History: Past Medical History:  Diagnosis Date   Anemia    Arm DVT (deep venous thromboembolism), acute, right (HCC)    Asthma    Hx of childhood asthma, disappeared for a while, then resurfaced 6-7 years ago.    Cardiomyopathy    with a negative cardiac catheterization in the past. (EF  appriximately 40-45%)    Heme positive stool 02/01/2019   HTN (hypertension)    x 30 years   Obesity, unspecified    Pneumonia    Sleep apnea    CPAP   Stroke (HCC) 04/16/2022   Unspecified disorder resulting from impaired renal function    Past Surgical History:  Procedure Laterality Date   CARDIOVERSION N/A 02/28/2023   Procedure: CARDIOVERSION;  Surgeon: Lenise Quince, MD;  Location: [facility] INVASIVE CV LAB;  Service: Cardiovascular;  Laterality: N/A;   CARDIOVERSION N/A 04/19/2023   Procedure: CARDIOVERSION;  Surgeon: Alwin Baars, DO;  Location: [facility];  Service: Cardiovascular;  Laterality: N/A;   None     RIGHT HEART CATH N/A 01/13/2023   Procedure: RIGHT HEART CATH;  Surgeon: Alwin Baars, DO;  Location: [facility];  Service: Cardiovascular;  Laterality: N/A;   TRANSESOPHAGEAL ECHOCARDIOGRAM (CATH LAB) N/A 01/14/2023   Procedure: TRANSESOPHAGEAL ECHOCARDIOGRAM;  Surgeon: Luana Rumple, MD;  Location: [facility];  Service: Cardiovascular;  Laterality: N/A;   TRANSESOPHAGEAL ECHOCARDIOGRAM (CATH LAB) N/A 02/28/2023   Procedure: TRANSESOPHAGEAL ECHOCARDIOGRAM;  Surgeon: Lenise Quince, MD;  Location: [facility] INVASIVE CV LAB;  Service: Cardiovascular;  Laterality: N/A;   TRANSESOPHAGEAL ECHOCARDIOGRAM (CATH LAB) N/A 04/19/2023   Procedure: TRANSESOPHAGEAL ECHOCARDIOGRAM;  Surgeon: Alwin Baars, DO;  Location: [facility];  Service: Cardiovascular;  Laterality: N/A;   Family History  Problem Relation Age of Onset   Multiple myeloma Father    Lupus Sister        died at 80   Diabetes Mellitus II Sister        died from covid-19   Neuropathy Sister    Asthma Daughter    Colon cancer Neg Hx    Esophageal cancer Neg Hx    Stomach cancer Neg Hx    Pancreatic cancer Neg Hx    Social History   Socioeconomic History   Marital status: Married    Spouse name: Claryce Cruel   Number of children: 3   Years of education: Not on file   Highest  education level: Bachelor's degree (e.g., BA, AB, BS)  Occupational History   Occupation: retired  Tobacco Use   Smoking status: Former   Smokeless tobacco: Never   Tobacco comments:    quit smoling in August 23, 1988. started when 18, 1 ppd  Vaping Use   Vaping status: Never Used  Substance and Sexual Activity   Alcohol  use: Not Currently    Comment: occ   Drug use: Never   Sexual activity: Not Currently  Other Topics Concern   Not on file  Social History Narrative   Grew up in [place], attended [employer]. First wife died of breast ca in 08/23/97. 3 children. Remarried- 8 years. 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    Pt signed designated party release granting access to [facility] to his wife  Claryce Cruel. Detailed message may be left on home or cell phone. Ana Kappa August 13, 2009 11:34 am.    Social Drivers of Health   Financial Resource Strain: Low Risk  (08/09/2023)   Overall Financial Resource Strain (CARDIA)    Difficulty of Paying Living Expenses: Not hard at all  Food Insecurity: No Food Insecurity (08/09/2023)   Hunger Vital Sign    Worried About Running Out of Food in the Last Year: Never true    Ran Out of Food in the Last Year: Never true  Transportation Needs: No Transportation Needs (08/09/2023)   PRAPARE - Administrator, Civil Service (Medical): No    Lack of Transportation (Non-Medical): No  Physical Activity: Insufficiently Active (08/09/2023)   Exercise Vital Sign    Days of Exercise per Week: 3 days    Minutes of Exercise per Session: 30 min  Stress: No Stress Concern Present (08/09/2023)   [employer] of Occupational Health - Occupational Stress Questionnaire    Feeling of Stress : Not at all  Social Connections: Socially Integrated (08/09/2023)   Social Connection and Isolation Panel [NHANES]    Frequency of Communication with Friends and Family: More than three times a week    Frequency of Social Gatherings with Friends and  Family: Three times a week    Attends Religious Services: More than 4 times per year    Active Member of Clubs or Organizations: Yes    Attends Banker Meetings: More than 4 times per year    Marital Status: Married    Tobacco Counseling Counseling given: Not Answered Tobacco comments: quit smoling in 1990. started when 18, 1 ppd    Clinical Intake:  Pre-visit preparation completed: Yes  Pain : No/denies pain  Diabetes: No  Lab Results  Component Value Date   HGBA1C 5.1 04/18/2022   HGBA1C 5.5 11/03/2021   HGBA1C 6.3 11/07/2018     How often do you need to have someone help you when you read instructions, pamphlets, or other written materials from your doctor or pharmacy?: 1 - Never  Interpreter Needed?: No  Information entered by :: Seabron Cypress LPN   Activities of Daily Living     08/09/2023    4:36 PM 05/04/2023    5:01 PM  In your present state of health, do you have any difficulty performing the following activities:  Hearing? 0 0  Vision? 0 0  Difficulty concentrating or making decisions? 0 0  Walking or climbing stairs? 1   Dressing or bathing? 0   Doing errands, shopping? 0   Preparing Food and eating ? N   Using the Toilet? N   In the past six months, have you accidently leaked urine? N   Do you have problems with loss of bowel control? N   Managing your Medications? N   Managing your Finances? N   Housekeeping or managing your Housekeeping? N     Patient Care Team: Dorrene Gaucher, NP as PCP - General (Internal Medicine) Eilleen Grates, MD as PCP - Cardiology (Cardiology) Cecilie Coffee, RPH-CPP (Pharmacist) Clarnce Crow, RN as Registered Nurse Alanson Alliance, MD as Consulting Physician (Rheumatology) [facility], [facility]  Indicate any recent Medical Services you may have received from other than [facility] providers in the past year (date may be approximate).     Assessment:    This is a routine wellness  examination for Brandon [name].  Hearing/Vision screen Hearing Screening - Comments:: Denies hearing difficulties  Vision Screening - Comments:: No vision problems; will schedule routine eye exam soon     Goals Addressed             This Visit's Progress    Remain active and independent         Depression Screen     08/09/2023    4:38 PM 02/03/2023    1:16 PM 09/21/2022    3:13 PM 07/01/2022    3:38 PM 06/29/2022    1:59 PM 05/28/2021    2:24 PM 08/12/2020    4:42 PM  PHQ 2/9 Scores  PHQ - 2 Score 0 0 0 0 0 0 0  PHQ- 9 Score     0      Fall Risk     08/09/2023    4:39 PM 08/04/2023    1:14 PM 07/25/2023    2:29 PM 09/21/2022    3:12 PM 07/01/2022    3:39 PM  Fall Risk   Falls in the past year? 0 0 1 0 0  Comment  no falls since last assessment     Number falls in past yr: 0  1 0 0  Comment   last fall over a month ago    Injury with Fall? 0  0 0 0  Risk for fall due to : History of fall(s);Impaired mobility  History of fall(s);Impaired balance/gait No Fall Risks No Fall Risks  Follow up Falls prevention discussed;Education provided;Falls evaluation completed   Falls evaluation completed Falls evaluation completed    MEDICARE RISK AT HOME:  Medicare Risk at Home Any stairs in or around the home?: No If so, are there any without handrails?: No Home free of loose throw rugs in walkways, pet beds, electrical cords, etc?: Yes Adequate lighting in your home to reduce risk of falls?: Yes Life alert?: No Use of a cane, walker or w/c?: Yes Grab bars in the bathroom?: Yes Shower chair or bench in shower?: Yes Elevated toilet seat or a handicapped toilet?: Yes  TIMED UP AND GO:  Was the test performed?  No  Cognitive Function: 6CIT completed        08/09/2023    4:40 PM 07/01/2022    3:51 PM 05/28/2021    2:30 PM  6CIT Screen  What Year? 0 points 0 points 0 points  What month? 0 points 0 points 0 points  What time? 0 points 0 points 0 points  Count back from 20 0 points 0  points 0 points  Months in reverse 0 points 0 points 0 points  Repeat phrase 0 points 0 points 0 points  Total Score 0 points 0 points 0 points    Immunizations Immunization History  Administered Date(s) Administered   Fluad Quad(high Dose 65+) 11/21/2018, 12/15/2020, 12/14/2021   Fluad Trivalent(High Dose 65+) 12/20/2022   Influenza Split 02/24/2011, 12/03/2011   Influenza Whole 03/16/2007, 12/29/2009   Influenza, High Dose Seasonal PF 02/26/2015, 12/09/2015, 04/22/2017, 12/14/2017   Influenza,inj,Quad PF,6+ Mos 02/13/2013, 11/14/2013   Moderna Covid-19 Vaccine Bivalent Booster 67yrs & up 12/08/2020   Moderna SARS-COV2 Booster Vaccination 12/15/2020   Moderna Sars-Covid-2 Vaccination 04/08/2019, 05/06/2019, 11/27/2019, 07/02/2020   Pfizer(Comirnaty)Fall Seasonal Vaccine 12 years and older 12/13/2021   Pneumococcal Conjugate-13 11/14/2013   Pneumococcal Polysaccharide-23 08/13/2009   Td 07/26/2006   Tdap 09/22/2012, 05/23/2017   Zoster Recombinant(Shingrix ) 04/15/2021   Zoster, Live 05/17/2011    Screening Tests Health Maintenance  Topic Date Due   Zoster Vaccines- Shingrix  (2 of 2) 06/10/2021  COVID-19 Vaccine (7 - 2024-25 season) 11/14/2022   INFLUENZA VACCINE  10/14/2023   Medicare Annual Wellness (AWV)  08/08/2024   DTaP/Tdap/Td (4 - Td or Tdap) 05/24/2027   Pneumonia Vaccine 69+ Years old  Completed   HPV VACCINES  Aged Out   Meningococcal B Vaccine  Aged Out   Hepatitis C Screening  Discontinued    Health Maintenance  Health Maintenance Due  Topic Date Due   Zoster Vaccines- Shingrix  (2 of 2) 06/10/2021   COVID-19 Vaccine (7 - 2024-25 season) 11/14/2022    Additional Screening:  Vision Screening: Recommended annual ophthalmology exams for early detection of glaucoma and other disorders of the eye.  Dental Screening: Recommended annual dental exams for proper oral hygiene  Community Resource Referral / Chronic Care Management: CRR required this visit?  No    CCM required this visit?  No   Plan:    I have personally reviewed and noted the following in the patient's chart:   Medical and social history Use of alcohol , tobacco or illicit drugs  Current medications and supplements including opioid prescriptions. Patient is not currently taking opioid prescriptions. Functional ability and status Nutritional status Physical activity Advanced directives List of other physicians Hospitalizations, surgeries, and ER visits in previous 12 months Vitals Screenings to include cognitive, depression, and falls Referrals and appointments  In addition, I have reviewed and discussed with patient certain preventive protocols, quality metrics, and best practice recommendations. A written personalized care plan for preventive services as well as general preventive health recommendations were provided to patient.   Seabron Cypress [place], [place]   5/62/1308   After Visit Summary: (MyChart) Due to this being a telephonic visit, the after visit summary with patients personalized plan was offered to patient via MyChart   Notes: Nothing significant to report at this time.

## 2023-08-10 ENCOUNTER — Ambulatory Visit (INDEPENDENT_AMBULATORY_CARE_PROVIDER_SITE_OTHER): Admitting: Family

## 2023-08-10 ENCOUNTER — Encounter: Payer: Self-pay | Admitting: Family

## 2023-08-10 VITALS — BP 133/74 | HR 66 | Temp 98.6°F | Resp 16 | Ht 71.0 in | Wt 179.0 lb

## 2023-08-10 DIAGNOSIS — I4819 Other persistent atrial fibrillation: Secondary | ICD-10-CM | POA: Diagnosis not present

## 2023-08-10 DIAGNOSIS — E785 Hyperlipidemia, unspecified: Secondary | ICD-10-CM | POA: Diagnosis not present

## 2023-08-10 DIAGNOSIS — N1831 Chronic kidney disease, stage 3a: Secondary | ICD-10-CM

## 2023-08-10 DIAGNOSIS — G4733 Obstructive sleep apnea (adult) (pediatric): Secondary | ICD-10-CM

## 2023-08-10 DIAGNOSIS — M069 Rheumatoid arthritis, unspecified: Secondary | ICD-10-CM | POA: Diagnosis not present

## 2023-08-10 DIAGNOSIS — I10 Essential (primary) hypertension: Secondary | ICD-10-CM

## 2023-08-10 DIAGNOSIS — E538 Deficiency of other specified B group vitamins: Secondary | ICD-10-CM | POA: Diagnosis not present

## 2023-08-10 DIAGNOSIS — F32A Depression, unspecified: Secondary | ICD-10-CM

## 2023-08-10 DIAGNOSIS — Z8673 Personal history of transient ischemic attack (TIA), and cerebral infarction without residual deficits: Secondary | ICD-10-CM | POA: Diagnosis not present

## 2023-08-10 DIAGNOSIS — I5020 Unspecified systolic (congestive) heart failure: Secondary | ICD-10-CM

## 2023-08-10 DIAGNOSIS — J45909 Unspecified asthma, uncomplicated: Secondary | ICD-10-CM | POA: Diagnosis not present

## 2023-08-10 DIAGNOSIS — N401 Enlarged prostate with lower urinary tract symptoms: Secondary | ICD-10-CM

## 2023-08-10 DIAGNOSIS — I5022 Chronic systolic (congestive) heart failure: Secondary | ICD-10-CM

## 2023-08-10 NOTE — Assessment & Plan Note (Signed)
 Stable- not on medication currently.

## 2023-08-10 NOTE — Assessment & Plan Note (Signed)
 Stable on flomax .  Lab Results  Component Value Date   PSA 1.06 06/29/2022   PSA 1.7 11/23/2019   PSA 3.40 01/30/2019

## 2023-08-10 NOTE — Assessment & Plan Note (Signed)
 Stable on singulair and prn albuterol.

## 2023-08-10 NOTE — Progress Notes (Unsigned)
 Subjective:     Patient ID: Brandon [name], male    DOB: 04/23/1940, 83 y.o.   MRN: [id number]  Chief Complaint  Patient presents with   Hypertension    Here for follow up    HPI  Discussed the use of AI scribe software for clinical note transcription with the patient, who gave verbal consent to proceed.  History of Present Illness  Brandon [name] is an 83 year old male who presents for a regular follow-up visit. He is accompanied by his wife.  Arthritis pain is well-managed with leflunomide . He received a hip injection for hip pain a few weeks ago. He takes Eliquis  5 mg twice daily and experiences easy bruising and bleeding from cuts. He has stopped using his CPAP machine due to no longer snoring, which he attributes to weight loss. Cholesterol levels were within target range in February, with an LDL of 61, and he is on Crestor . Blood pressure is well-controlled without medication, and mood is stable. Edema is managed with Demadex  20 mg daily. Flomax  effectively manages urinary symptoms. Asthma is stable with Singulair  and albuterol  as needed.  He occasionally consumes alcohol  but has abstained for the past year and a half following a stroke and congestive heart failure. He has been living with his daughter for three months due to difficulty with stairs and is undergoing occupational and physical therapy with reported improvement. He is considering flying to visit family.     Health Maintenance Due  Topic Date Due   Zoster Vaccines- Shingrix  (2 of 2) 06/10/2021   COVID-19 Vaccine (7 - 2024-25 season) 11/14/2022    Past Medical History:  Diagnosis Date   Anemia    Arm DVT (deep venous thromboembolism), acute, right (HCC)    Asthma    Hx of childhood asthma, disappeared for a while, then resurfaced 6-7 years ago.    Cardiomyopathy    with a negative cardiac catheterization in the past. (EF appriximately 40-45%)    Heme positive stool 02/01/2019   HTN (hypertension)    x  30 years   Obesity, unspecified    Pneumonia    Sleep apnea    CPAP   Stroke (HCC) 04/16/2022   Unspecified disorder resulting from impaired renal function     Past Surgical History:  Procedure Laterality Date   CARDIOVERSION N/A 02/28/2023   Procedure: CARDIOVERSION;  Surgeon: Lenise Quince, MD;  Location: [facility];  Service: Cardiovascular;  Laterality: N/A;   CARDIOVERSION N/A 04/19/2023   Procedure: CARDIOVERSION;  Surgeon: Alwin Baars, DO;  Location: [facility];  Service: Cardiovascular;  Laterality: N/A;   None     RIGHT HEART CATH N/A 01/13/2023   Procedure: RIGHT HEART CATH;  Surgeon: Alwin Baars, DO;  Location: [facility];  Service: Cardiovascular;  Laterality: N/A;   TRANSESOPHAGEAL ECHOCARDIOGRAM (CATH LAB) N/A 01/14/2023   Procedure: TRANSESOPHAGEAL ECHOCARDIOGRAM;  Surgeon: Luana Rumple, MD;  Location: [facility];  Service: Cardiovascular;  Laterality: N/A;   TRANSESOPHAGEAL ECHOCARDIOGRAM (CATH LAB) N/A 02/28/2023   Procedure: TRANSESOPHAGEAL ECHOCARDIOGRAM;  Surgeon: Lenise Quince, MD;  Location: [facility] INV[facility] CV LAB;  Service: Cardiovascular;  Laterality: N/A;   TRANSESOPHAGEAL ECHOCARDIOGRAM (CATH LAB) N/A 04/19/2023   Procedure: TRANSESOPHAGEAL ECHOCARDIOGRAM;  Surgeon: Alwin Baars, DO;  Location: [facility];  Service: Cardiovascular;  Laterality: N/A;    Family History  Problem Relation Age of Onset   Multiple myeloma Father    Lupus Sister  died at 12   Diabetes Mellitus II Sister        died from covid-19   Neuropathy Sister    Asthma Daughter    Colon cancer Neg Hx    Esophageal cancer Neg Hx    Stomach cancer Neg Hx    Pancreatic cancer Neg Hx     Social History   Socioeconomic History   Marital status: Married    Spouse name: Claryce Cruel   Number of children: 3   Years of education: Not on file   Highest education level: Bachelor's degree (e.g., BA, AB, BS)  Occupational History    Occupation: retired  Tobacco Use   Smoking status: Former   Smokeless tobacco: Never   Tobacco comments:    quit smoling in 10-Sep-1988. started when 18, 1 ppd  Vaping Use   Vaping status: Never Used  Substance and Sexual Activity   Alcohol  use: Not Currently    Comment: occ   Drug use: Never   Sexual activity: Not Currently  Other Topics Concern   Not on file  Social History Narrative   Grew up in [place], attended [employer]. First wife died of breast ca in September 10, 1997. 3 children. Remarried- 8 years. 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    Pt signed designated party release granting access to [facility] to his wife Claryce Cruel. Detailed message may be left on home or cell phone. Ana Kappa August 13, 2009 11:34 am.    Social Drivers of Health   Financial Resource Strain: Low Risk  (08/09/2023)   Overall Financial Resource Strain (CARDIA)    Difficulty of Paying Living Expenses: Not hard at all  Food Insecurity: No Food Insecurity (08/09/2023)   Hunger Vital Sign    Worried About Running Out of Food in the Last Year: Never true    Ran Out of Food in the Last Year: Never true  Transportation Needs: No Transportation Needs (08/09/2023)   PRAPARE - Administrator, Civil Service (Medical): No    Lack of Transportation (Non-Medical): No  Physical Activity: Insufficiently Active (08/09/2023)   Exercise Vital Sign    Days of Exercise per Week: 3 days    Minutes of Exercise per Session: 30 min  Stress: No Stress Concern Present (08/09/2023)   [employer] of Occupational Health - Occupational Stress Questionnaire    Feeling of Stress : Not at all  Social Connections: Socially Integrated (08/09/2023)   Social Connection and Isolation Panel [NHANES]    Frequency of Communication with Friends and Family: More than three times a week    Frequency of Social Gatherings with Friends and Family: Three times a week    Attends Religious Services: More than 4 times per  year    Active Member of Clubs or Organizations: Yes    Attends Banker Meetings: More than 4 times per year    Marital Status: Married  Catering manager Violence: Not At Risk (08/09/2023)   Humiliation, Afraid, Rape, and Kick questionnaire    Fear of Current or Ex-Partner: No    Emotionally Abused: No    Physically Abused: No    Sexually Abused: No    Outpatient Medications Prior to Visit  Medication Sig Dispense Refill   acetaminophen  (TYLENOL ) 500 MG tablet Take 500 mg by mouth in the morning, at noon, and at bedtime.     albuterol  (PROVENTIL ) (2.5 MG/3ML) 0.083% nebulizer solution TAKE 3 MLS BY NEBULIZER EVERY 6 HOURS AS  NEEDED FOR WHEEZING FOR SHORTNESS OF BREATH 150 mL 3   amiodarone  (PACERONE ) 200 MG tablet Take 1 tablet (200 mg total) by mouth daily. 30 tablet 2   apixaban  (ELIQUIS ) 5 MG TABS tablet Take 1 tablet by mouth twice daily 60 tablet 5   fluticasone -salmeterol (ADVAIR) 500-50 MCG/ACT AEPB Inhale 1 puff into the lungs in the morning and at bedtime.     gabapentin  (NEURONTIN ) 300 MG capsule Take 1 capsule (300 mg total) by mouth at bedtime. 90 capsule 1   leflunomide  (ARAVA ) 20 MG tablet Take 1 tablet (20 mg total) by mouth daily.     rosuvastatin  (CRESTOR ) 10 MG tablet Take 1 tablet (10 mg total) by mouth daily. 90 tablet 3   tamsulosin  (FLOMAX ) 0.4 MG CAPS capsule Take 1 capsule by mouth once daily 90 capsule 0   torsemide  (DEMADEX ) 20 MG tablet Take 2.5 tablets (50 mg total) by mouth daily.     montelukast  (SINGULAIR ) 10 MG tablet Take 10 mg by mouth at bedtime. For asthma     amoxicillin -clavulanate (AUGMENTIN ) 875-125 MG tablet Take 1 tablet by mouth 2 (two) times daily. (Patient not taking: Reported on 08/09/2023) 14 tablet 0   No facility-administered medications prior to visit.    Allergies  Allergen Reactions   Ace Inhibitors Cough   Isosorb Dinitrate-Hydralazine Other (See Comments)    dizziness/hypotensive    ROS    See HPI Objective:      Physical Exam Constitutional:      General: He is not in acute distress.    Appearance: He is well-developed.  HENT:     Head: Normocephalic and atraumatic.  Cardiovascular:     Rate and Rhythm: Normal rate and regular rhythm.     Heart sounds: No murmur heard. Pulmonary:     Effort: Pulmonary effort is normal. No respiratory distress.     Breath sounds: Normal breath sounds. No wheezing or rales.  Musculoskeletal:     Right lower leg: 1+ Edema present.     Left lower leg: 1+ Edema present.  Skin:    General: Skin is warm and dry.  Neurological:     Mental Status: He is alert and oriented to person, place, and time.  Psychiatric:        Behavior: Behavior normal.        Thought Content: Thought content normal.      BP 133/74 (BP Location: Left Arm, Patient Position: Sitting, Cuff Size: Large)   Pulse 66   Temp 98.6 F (37 C) (Oral)   Resp 16   Ht 5\' 11"  (1.803 m)   Wt 179 lb (81.2 kg)   SpO2 100%   BMI 24.97 kg/m  Wt Readings from Last 3 Encounters:  08/10/23 179 lb (81.2 kg)  08/09/23 174 lb (78.9 kg)  08/04/23 174 lb (78.9 kg)       Assessment & Plan:   Problem List Items Addressed This Visit       Unprioritized   Stage 3a chronic kidney disease (HCC) - Primary   Has appointment on 3/19 with Dr. Shaune Delaine.      Rheumatoid arthritis (HCC)   Continues Leflunomide .   This is being managed by Rheumatology.       Persistent atrial fibrillation (HCC)   Continues eliquis  5mg  bid per cardiology. Rate stable on amiodarone .       OSA (obstructive sleep apnea)   Relevant Orders   Ambulatory referral to Pulmonology   Hyperlipidemia   Lab Results  Component Value Date   CHOL 112 04/16/2023   HDL 38 (L) 04/16/2023   LDLCALC 61 04/16/2023   TRIG 66 04/16/2023   CHOLHDL 2.9 04/16/2023   Stable and at goal on crestor .  Continue same.        History of CVA (cerebrovascular accident)   Patient is doing very well from a stroke stand point.  Continue  crestor /eliquis  for secondary prevention.      HFrEF (heart failure with reduced ejection fraction) (HCC)   Appears clinically compensated.  Continue current dose of demadex .      Essential hypertension   BP Readings from Last 3 Encounters:  08/10/23 133/74  08/04/23 131/78  07/11/23 122/70   Stable, not on antihypertensive.       Depression   Stable- not on medication currently.      CHRONIC SYSTOLIC HEART FAILURE   Reports edema is stable on demadex . Continue same.      BPH (benign prostatic hyperplasia)   Stable on flomax .  Lab Results  Component Value Date   PSA 1.06 06/29/2022   PSA 1.7 11/23/2019   PSA 3.40 01/30/2019          B12 deficiency   Stable without supplement.      Asthma   Stable on singulair  and prn albuterol .       I have discontinued Mckay E. Clavijo's amoxicillin -clavulanate. I am also having him maintain his albuterol , gabapentin , amiodarone , acetaminophen , rosuvastatin , leflunomide , apixaban , fluticasone -salmeterol, torsemide , and tamsulosin .  No orders of the defined types were placed in this encounter.

## 2023-08-10 NOTE — Assessment & Plan Note (Signed)
 BP Readings from Last 3 Encounters:  08/10/23 133/74  08/04/23 131/78  07/11/23 122/70   Stable, not on antihypertensive.

## 2023-08-10 NOTE — Assessment & Plan Note (Signed)
 Reports edema is stable on demadex . Continue same.

## 2023-08-10 NOTE — Assessment & Plan Note (Signed)
 Continues eliquis  5mg  bid per cardiology. Rate stable on amiodarone .

## 2023-08-10 NOTE — Assessment & Plan Note (Signed)
 Has appointment on 3/19 with Dr. Shaune Delaine.

## 2023-08-10 NOTE — Assessment & Plan Note (Signed)
 Stable without supplement.

## 2023-08-10 NOTE — Patient Instructions (Signed)
 VISIT SUMMARY:  Today, we reviewed your current health status and medications. Your arthritis pain, congestive heart failure, edema, asthma, and cholesterol levels are all well-managed with your current treatments. We also discussed your sleep apnea, driving safety, and upcoming travel plans.  YOUR PLAN:  RHEUMATOID ARTHRITIS: Your arthritis pain is well-controlled with leflunomide . -Continue taking leflunomide  as prescribed. -Follow up with your rheumatologist as scheduled.  CONGESTIVE HEART FAILURE: Your condition is well-managed with torsemide  and furosemide . -Continue taking torsemide  and furosemide  as prescribed.  EDEMA: Your swelling is well-controlled with your current diuretic regimen. -Continue your current diuretic regimen with torsemide  and furosemide .  OBSTRUCTIVE SLEEP APNEA: You have stopped snoring and have lost weight, but we need to evaluate if you still need the CPAP machine. -You will be referred to a sleep specialist for evaluation and possibly a repeat sleep study. -Contact the pulmonary office if you do not hear from them within a week.  ASTHMA: Your asthma is well-controlled with Singulair  and albuterol . -Continue taking Singulair  and use albuterol  as needed.  HYPERLIPIDEMIA: Your LDL cholesterol is at a good level. -Continue taking Crestor  as prescribed.  BENIGN PROSTATIC HYPERPLASIA: Your urinary symptoms are well-managed with Flomax . -Continue taking Flomax  as prescribed.

## 2023-08-10 NOTE — Assessment & Plan Note (Signed)
 Lab Results  Component Value Date   CHOL 112 04/16/2023   HDL 38 (L) 04/16/2023   LDLCALC 61 04/16/2023   TRIG 66 04/16/2023   CHOLHDL 2.9 04/16/2023   Stable and at goal on crestor .  Continue same.

## 2023-08-10 NOTE — Assessment & Plan Note (Signed)
 Continues Leflunomide .   This is being managed by Rheumatology.

## 2023-08-11 ENCOUNTER — Other Ambulatory Visit: Payer: Self-pay | Admitting: Family

## 2023-08-11 NOTE — Assessment & Plan Note (Signed)
 Patient is doing very well from a stroke stand point.  Continue crestor /eliquis  for secondary prevention.

## 2023-08-11 NOTE — Addendum Note (Signed)
 Addended by: Dorrene Gaucher on: 08/11/2023 02:26 PM   Modules accepted: Level of Service

## 2023-08-11 NOTE — Assessment & Plan Note (Signed)
 Appears clinically compensated.  Continue current dose of demadex .

## 2023-08-12 ENCOUNTER — Ambulatory Visit: Admitting: Cardiology

## 2023-08-14 NOTE — Progress Notes (Unsigned)
 Cardiology Office Note:   Date:  08/16/2023  ID:  Kenaz E [name], DOB 1940-03-27, MRN [id number] PCP: Dorrene Gaucher, NP  [facility] Providers Cardiologist:  Eilleen Grates, MD {  History of Present Illness:   Brandon [name] is a 83 y.o. male who presents for follow up of cardiomyopathy.   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  He was last seen in February 2025 and discharged from the hospital.  Cardiogenic shock with recurrent atrial fibrillation.  He diuresed and had cardioversion.  He required milrinone .  He had progressive cardiorenal syndrome and palliative care was consulted.  He was thought to be appropriate for outpatient hospice.   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  He was treated with a higher dose Eliquis .  He was discharged to a nursing home.  He actually went through rehab and then went to his daughter's house.  He is now back in his house with his wife.  He is walking with a cane.  He is actually walking quickly and not having any shortness of breath.  Palliative care still calls him and checks on him and he has PT and OT coming to the house but he is done pretty well.  He has not had any chest pressure, neck or arm discomfort.  He denies any PND or orthopnea.  He said no chest pressure, neck or arm discomfort.  He said no weight gain or edema.  He is watching his salt for the most part.  His daughter says he drinks a little bit of alcohol .  '   ROS: As stated in the HPI and negative for all other systems.  Studies Reviewed:    EKG:   EKG Interpretation Date/Time:  Tuesday August 16 2023 15:57:54 EDT Ventricular Rate:  76 PR Interval:  198 QRS Duration:  146 QT Interval:  466 QTC Calculation: 524 R Axis:   -31  Text Interpretation: Sinus rhythm with frequent and consecutive Premature ventricular complexes Left axis deviation Right bundle branch block When compared with ECG of 19-Apr-2023 15:01, No significant change was  found Confirmed by Eilleen Grates ([id number]) on 08/16/2023 4:15:18 PM     Risk Assessment/Calculations:    CHA2DS2-VASc Score = 5   This indicates a 7.2% annual risk of stroke. The patient's score is based upon: CHF History: 1 HTN History: 0 Diabetes History: 0 Stroke History: 2 Vascular Disease History: 0 Age Score: 2 Gender Score: 0       Physical Exam:   VS:  BP (!) 140/50 (BP Location: Left Arm, Patient Position: Sitting, Cuff Size: Normal)   Pulse 86   Ht 5\' 11"  (1.803 m)   Wt 178 lb (80.7 kg)   SpO2 97%   BMI 24.83 kg/m    Wt Readings from Last 3 Encounters:  08/16/23 178 lb (80.7 kg)  08/10/23 179 lb (81.2 kg)  08/09/23 174 lb (78.9 kg)     GEN: Well nourished, well developed in no acute distress NECK: No JVD; No carotid bruits CARDIAC: RRR and bigeminal pattern, no murmurs, rubs, gallops RESPIRATORY:  Clear to auscultation without rales, wheezing or rhonchi  ABDOMEN: Soft, non-tender, non-distended EXTREMITIES:  Mild leg edema; No deformity   ASSESSMENT AND PLAN:    Chronic systolic HF: The patient has done remarkably well.  He wants very conservative therapy.  Emina try to restart a low-dose of metoprolol  25 mg daily.  His daughter pushed back against this  a little bit because he is doing well but I would like to try and titrate meds just a bit.  His renal function would not allow ARNI, spironolactone , ACE inhibitor.   He seems to be euvolemic and will continue the meds as listed.    I will have him check a basic metabolic profile and TSH.   Atrial fibrillation:   He is tolerating anticoagulation.  I will reduce to 2.5 mg twice daily given his renal function and his age.  MR: This was actually low on follow-up TEE.   AKI on CKD IIIb:   Creatinine is stabilized and he is followed by nephrology.  DVT: I note that he was put on 5 mg twice daily intentionally given the DVT but at this point he is far enough removed from a critical illness that I think is reasonable  to go down to the therapeutic dose with his renal function.    Follow up with APP in a couple of months.   Signed, Eilleen Grates, MD

## 2023-08-16 ENCOUNTER — Ambulatory Visit: Attending: Cardiology | Admitting: Cardiology

## 2023-08-16 ENCOUNTER — Encounter: Payer: Self-pay | Admitting: Cardiology

## 2023-08-16 VITALS — BP 140/50 | HR 86 | Ht 71.0 in | Wt 178.0 lb

## 2023-08-16 DIAGNOSIS — I4819 Other persistent atrial fibrillation: Secondary | ICD-10-CM

## 2023-08-16 DIAGNOSIS — R002 Palpitations: Secondary | ICD-10-CM

## 2023-08-16 DIAGNOSIS — I5022 Chronic systolic (congestive) heart failure: Secondary | ICD-10-CM

## 2023-08-16 MED ORDER — METOPROLOL SUCCINATE ER 50 MG PO TB24: 25.0000 mg | ORAL_TABLET | Freq: Every day | ORAL | 3 refills | Status: AC

## 2023-08-16 MED ORDER — APIXABAN 2.5 MG PO TABS: 2.5000 mg | ORAL_TABLET | Freq: Two times a day (BID) | ORAL | 3 refills | Status: DC

## 2023-08-16 NOTE — Patient Instructions (Signed)
 Medication Instructions:  Start Metoprolol  Succinate 25 mg once daily Decrease Eliquis  to 2.5 mg twice daily *If you need a refill on your cardiac medications before your next appointment, please call your pharmacy*  Lab Work: TSH, BMET today If you have labs (blood work) drawn today and your tests are completely normal, you will receive your results only by: MyChart Message (if you have MyChart) OR A paper copy in the mail If you have any lab test that is abnormal or we need to change your treatment, we will call you to review the results.  Testing/Procedures: NONE  Follow-Up: At [facility], you and your health needs are our priority.  As part of our continuing mission to provide you with exceptional heart care, our providers are all part of one team.  This team includes your primary Cardiologist (physician) and Advanced Practice Providers or APPs (Physician Assistants and Nurse Practitioners) who all work together to provide you with the care you need, when you need it.  Your next appointment:   3 month(s)  Provider:   Callie Goodrich, PA-C  We recommend signing up for the patient portal called "MyChart".  Sign up information is provided on this After Visit Summary.  MyChart is used to connect with patients for Virtual Visits (Telemedicine).  Patients are able to view lab/test results, encounter notes, upcoming appointments, etc.  Non-urgent messages can be sent to your provider as well.   To learn more about what you can do with MyChart, go to [email or web address].

## 2023-08-17 ENCOUNTER — Ambulatory Visit: Payer: Self-pay | Admitting: *Deleted

## 2023-08-17 DIAGNOSIS — I480 Paroxysmal atrial fibrillation: Secondary | ICD-10-CM | POA: Diagnosis not present

## 2023-08-17 DIAGNOSIS — I5020 Unspecified systolic (congestive) heart failure: Secondary | ICD-10-CM | POA: Diagnosis not present

## 2023-08-17 DIAGNOSIS — G4733 Obstructive sleep apnea (adult) (pediatric): Secondary | ICD-10-CM | POA: Diagnosis not present

## 2023-08-17 DIAGNOSIS — N184 Chronic kidney disease, stage 4 (severe): Secondary | ICD-10-CM | POA: Diagnosis not present

## 2023-08-17 DIAGNOSIS — I714 Abdominal aortic aneurysm, without rupture, unspecified: Secondary | ICD-10-CM | POA: Diagnosis not present

## 2023-08-17 DIAGNOSIS — R5381 Other malaise: Secondary | ICD-10-CM | POA: Diagnosis not present

## 2023-08-17 DIAGNOSIS — J45909 Unspecified asthma, uncomplicated: Secondary | ICD-10-CM | POA: Diagnosis not present

## 2023-08-17 DIAGNOSIS — I429 Cardiomyopathy, unspecified: Secondary | ICD-10-CM | POA: Diagnosis not present

## 2023-08-17 DIAGNOSIS — Z87891 Personal history of nicotine dependence: Secondary | ICD-10-CM | POA: Diagnosis not present

## 2023-08-17 DIAGNOSIS — I272 Pulmonary hypertension, unspecified: Secondary | ICD-10-CM | POA: Diagnosis not present

## 2023-08-17 DIAGNOSIS — J449 Chronic obstructive pulmonary disease, unspecified: Secondary | ICD-10-CM | POA: Diagnosis not present

## 2023-08-17 DIAGNOSIS — M6281 Muscle weakness (generalized): Secondary | ICD-10-CM | POA: Diagnosis not present

## 2023-08-17 DIAGNOSIS — Z8673 Personal history of transient ischemic attack (TIA), and cerebral infarction without residual deficits: Secondary | ICD-10-CM | POA: Diagnosis not present

## 2023-08-17 DIAGNOSIS — Z7901 Long term (current) use of anticoagulants: Secondary | ICD-10-CM | POA: Diagnosis not present

## 2023-08-17 DIAGNOSIS — D631 Anemia in chronic kidney disease: Secondary | ICD-10-CM | POA: Diagnosis not present

## 2023-08-17 DIAGNOSIS — I130 Hypertensive heart and chronic kidney disease with heart failure and stage 1 through stage 4 chronic kidney disease, or unspecified chronic kidney disease: Secondary | ICD-10-CM | POA: Diagnosis not present

## 2023-08-17 LAB — BASIC METABOLIC PANEL WITH GFR
BUN/Creatinine Ratio: 16 (ref 10–24)
BUN: 40 mg/dL — ABNORMAL HIGH (ref 8–27)
CO2: 18 mmol/L — ABNORMAL LOW (ref 20–29)
Calcium: 9.5 mg/dL (ref 8.6–10.2)
Chloride: 100 mmol/L (ref 96–106)
Creatinine, Ser: 2.43 mg/dL — ABNORMAL HIGH (ref 0.76–1.27)
Glucose: 87 mg/dL (ref 70–99)
Potassium: 4.7 mmol/L (ref 3.5–5.2)
Sodium: 139 mmol/L (ref 134–144)
eGFR: 26 mL/min/{1.73_m2} — ABNORMAL LOW (ref >59)

## 2023-08-17 LAB — TSH: TSH: 0.333 u[IU]/mL — ABNORMAL LOW (ref 0.450–4.500)

## 2023-08-22 ENCOUNTER — Other Ambulatory Visit: Payer: Self-pay

## 2023-08-22 ENCOUNTER — Telehealth: Payer: Self-pay

## 2023-08-22 ENCOUNTER — Other Ambulatory Visit (INDEPENDENT_AMBULATORY_CARE_PROVIDER_SITE_OTHER): Admitting: Pharmacist

## 2023-08-22 DIAGNOSIS — I5020 Unspecified systolic (congestive) heart failure: Secondary | ICD-10-CM

## 2023-08-22 DIAGNOSIS — I4819 Other persistent atrial fibrillation: Secondary | ICD-10-CM

## 2023-08-22 NOTE — Telephone Encounter (Signed)
 Copied from CRM [id number]. Topic: General - Other >> Aug 22, 2023  2:13 PM Aisha D wrote: Reason for CRM: Pt stated that he has a missed call from [facility] and is returning the call. Pt has a phone appt with Tammy at 2pm and was in the restroom when he missed the call. Pt would like a callback whenever she is available to complete the appt.

## 2023-08-22 NOTE — Progress Notes (Signed)
 08/22/2023 Name: Brandon [name] MRN: [id number] DOB: [birth year]  Chief Complaint  Patient presents with   Medication Management   Diabetes   Hypertension    Brandon [name] is a 83 y.o. year old male who was referred for medication management by their primary care provider, Brandon Gaucher, NP. T   Patient self referred to Clinical Pharmacist Practitioner to discuss medication changes and medication management   Subjective:  Care Team: Primary Care Provider: Dorrene Gaucher, NP ; Next Scheduled Visit: 12/13/2023 Cardiologist: Dr Brandon [name]; Next Scheduled Visit: recall set for March 2025 Nephrologist: Dr Brandon [name]; Next Scheduled Visit: 09/01/2023  Medication Access/Adherence  Current Pharmacy:  [facility] [address] [place], [place] - [address] [place] [address] Phone: [phone] Fax: [phone]  [facility] [address] [place] [address] Phone: [phone] Fax: [phone]   Patient reports affordability concerns with their medications: No  - initial cost in 2025 for Eliquis  was high until patient met deductible, now cost is $47 per month.  Patient reports access/transportation concerns to their pharmacy: No  Patient reports adherence concerns with their medications:  No     04/15/2023 to 04/28/2023 Hospitalization: For cardiogenic shock in the setting of recurrent atrial fibrillation. Placed on lasix  gtt w/ milrinone  support for palliative diuresis. He diuresed well and was weaned off milrinone  and underwent successful DCCV. Given end-stage HF and progressive cardiorenal syndrome, palliative care team was consulted for GOC discussion. Patient and family opted to transition to comfort care. Palliative care team followed along for comfort/ symptom management. He was continued on amiodarone , gabapentin , inhalers, Singulair  and torsemide  for comfort. All other meds were discontinued.    05/03/2023 - returned to hospital due to swelling in upper arm. Ultrasound showed DVT in upper right extrememity. Eliquis  was restarted at 5mg  twice a day.    Heart Failure (EF <20% - 05/10/2023):  Current medications:  ACEi/ARB/ARNI: no SGLT2i: no Beta blocker: yes - started metoprolol  succinate 08/16/2023 Mineralocorticoid Receptor Antagonist: no Diuretic regimen: yes - torsemide   Several medications were stopped during Feb 2025 hospitalization when family has opted for comfort / palliative care.   Current home blood pressure readings: SBP 110 to 130; Patient did not any DBP or Heart rate readings to report.  Current home weights: this morning weight was 176lbs - patient is checking daily and knows to contact office if weight increase by more than 2 lbs in 24 hours or 5 lbs in a week.   Patient denies volume overload signs or symptoms including no shortness of breath, lower extremity edema, increased use of pillows at night  Current physical activity: receiving physical therapy - once per week   Atrial Fibrillation and DVT to right uppper extremity 04/2023:  Current medications:  Rate Control: metoprolol  succinate ER 50mg  - take 0.5 tablet daily.  Rhythm Control: amiodarone  200mg  daily  Anticoagulation Regimen: Eliquis  2.5mg  twice a day (lowered back to 2.5mg  08/16/2023 - DVT treated for 3 to 4 months)  Medications tried in the past  CHA2DS2-VASc Score = 6  This indicates a 7.2% annual risk of stroke. The patient's score is based upon: CHF History: 1 HTN History: 1 Diabetes History: 0 Stroke History: 2 Vascular Disease History: 0 Age Score: 2 Gender Score: 0  BP Readings from Last 3 Encounters:  08/16/23 (!) 140/50  08/10/23 133/74  08/04/23 131/78    Patient denies hypotensive s/sx including no dizziness, lightheadedness. Patient denies hypertensive symptoms including  no headache, chest pain, shortness of breath  Objective:  Lab Results  Component Value  Date   HGBA1C 5.1 04/18/2022    Lab Results  Component Value Date   CREATININE 2.43 (H) 08/16/2023   BUN 40 (H) 08/16/2023   NA 139 08/16/2023   K 4.7 08/16/2023   CL 100 08/16/2023   CO2 18 (L) 08/16/2023    Lab Results  Component Value Date   CHOL 112 04/16/2023   HDL 38 (L) 04/16/2023   LDLCALC 61 04/16/2023   TRIG 66 04/16/2023   CHOLHDL 2.9 04/16/2023    Medications Reviewed Today     Reviewed by Brandon [name], RPH-CPP (Pharmacist) on 08/22/23 at 1428  Med List Status: <None>   Medication Order Taking? Sig Documenting Provider Last Dose Status Informant  acetaminophen  (TYLENOL ) 500 MG tablet [id number] Yes Take 500 mg by mouth 2 (two) times daily. Morning and bedtime [provider] Taking Active            Med Note Brandon [name], Brandon [name]   Brandon Aug 22, 2023  2:26 PM)    albuterol  (PROVENTIL ) (2.5 MG/3ML) 0.083% nebulizer solution [id number] Yes TAKE 3 MLS BY NEBULIZER EVERY 6 HOURS AS NEEDED FOR WHEEZING FOR SHORTNESS OF Brandon Rector, NP Taking Active Self, Pharmacy Records  amiodarone  (PACERONE ) 200 MG tablet [id number] Yes Take 1 tablet (200 mg total) by mouth daily. Brandon [name] M, PA-C Taking Active   apixaban  (ELIQUIS ) 2.5 MG TABS tablet [id number] Yes Take 1 tablet (2.5 mg total) by mouth 2 (two) times daily. Brandon Grates, MD Taking Active   fluticasone -salmeterol (ADVAIR) 500-50 MCG/ACT AEPB [id number] Yes Inhale 1 puff into the lungs in the morning and at bedtime. [provider] Taking Active   gabapentin  (NEURONTIN ) 300 MG capsule [id number] Yes Take 1 capsule (300 mg total) by mouth at bedtime. Brandon Gaucher, NP Taking Active Self, Pharmacy Records  leflunomide  (ARAVA ) 20 MG tablet [id number] Yes Take 1 tablet (20 mg total) by mouth daily. Brandon Gaucher, NP Taking Active   metoprolol  succinate (TOPROL -XL) 50 MG 24 hr tablet [id number] No Take 0.5 tablets (25 mg total) by mouth daily. Take with or immediately following a meal.   Patient not taking: Reported on 08/22/2023   Brandon Grates, MD Not Taking Active            Med Note Clarnce Crow   Brandon Aug 22, 2023  1:07 PM) Patient to pick up prescription today  montelukast  (SINGULAIR ) 10 MG tablet [id number] Yes TAKE 1 TABLET BY MOUTH AT BEDTIME Brandon Gaucher, NP Taking Active   rosuvastatin  (CRESTOR ) 10 MG tablet [id number] Yes Take 1 tablet (10 mg total) by mouth daily. Brandon Gaucher, NP Taking Active   tamsulosin  (FLOMAX ) 0.4 MG CAPS capsule [id number] Yes Take 1 capsule by mouth once daily O'Sullivan, [name], NP Taking Active   torsemide  (DEMADEX ) 20 MG tablet [id number] Yes Take 2.5 tablets (50 mg total) by mouth daily. Brandon Gaucher, NP Taking Active   Med List Note Louvenia Roys, CPhT 05/04/23 0405): [facility] [phone]              Assessment/Plan:   Heart Failure: Weight and edema stable. Several GDT stopped during Feb 2025 hospitalizaiton for comfort / palliative care. - Reviewed appropriate blood pressure monitoring technique and reviewed goal blood pressure - Reviewed to weigh daily and when to contact cardiology with weight gain - Recommend to continue torsemide  and metoprolol     Atrial Fibrillation: - Reviewed importance of adherence  to anticoagulant for stroke prevention and DVT recurrence prevention - Recommend to continue Eliquis  2.5mg  twice a day (completed 4 months of 5mg  twice a day for DVT treatment 04/2023 to 08/2023)  - contacted [facility] because Eliquis  was filled for #90 which is a 45 day supply and he is being charged 2 copays or $94. Lowered fill to just #60 or 30 days and copay is only #47.  -Reminded patient that rosuvastatin  will be due soon (LR was for 90 day supply 06/09/2023)  Follow Up Plan: 1 to 2 months.   Brandon [name], PharmD Clinical Pharmacist [facility]

## 2023-08-22 NOTE — Patient Outreach (Signed)
 Complex Care Management   Visit Note  08/22/2023  Name:  Brandon [name] MRN: [id number] DOB: Nov 19, 1940  Situation: Referral received for Complex Care Management related to Heart Failure, Chronic Kidney Disease, and HTN I obtained verbal consent from Patient.  Visit completed with patient  on the phone  Background:   Past Medical History:  Diagnosis Date   Anemia    Arm DVT (deep venous thromboembolism), acute, right (HCC)    Asthma    Hx of childhood asthma, disappeared for a while, then resurfaced 6-7 years ago.    Cardiomyopathy    with a negative cardiac catheterization in the past. 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     Assessment: Patient Reported Symptoms:  Cognitive Cognitive Status: Alert and oriented to person, place, and time      Neurological Neurological Review of Symptoms: No symptoms reported    HEENT HEENT Symptoms Reported: No symptoms reported      Cardiovascular Cardiovascular Symptoms Reported: No symptoms reported Does patient have uncontrolled Hypertension?: Yes Patient's Recent BP reading at home: systolic 110 does not remember diastolic Cardiovascular Conditions: Hypertension, Heart failure Cardiovascular Management Strategies: Routine screening, Medication therapy, Weight management Do You Have a Working Readable Scale?: Yes Weight: 176 lb (79.8 kg) Cardiovascular Self-Management Outcome: 3 (uncertain)  Respiratory Respiratory Symptoms Reported: No symptoms reported    Endocrine Patient reports the following symptoms related to hypoglycemia or hyperglycemia : No symptoms reported    Gastrointestinal Gastrointestinal Symptoms Reported: No symptoms reported      Genitourinary Genitourinary Symptoms Reported: No symptoms reported     Integumentary Integumentary Symptoms Reported: No symptoms reported Additional Integumentary Details: eliquis  dosage reduced to 2.5 twice daily Skin Management Strategies: Medication therapy  Musculoskeletal Additional Musculoskeletal Details: at home, now receiving PT/OT once weekly, only using cane  able to ambulate and climb stairs Musculoskeletal Conditions: Mobility limited Musculoskeletal Management Strategies: Medical device, Medication therapy, Exercise, Routine screening      Psychosocial Psychosocial Symptoms Reported: No symptoms reported            08/10/2023    2:27 PM  Depression screen PHQ 2/9  Decreased Interest 0  Down, Depressed, Hopeless 0  PHQ - 2 Score 0  Altered sleeping 0  Tired, decreased energy 0  Change in appetite 0  Feeling bad or failure about yourself  0  Trouble concentrating 0  Moving slowly or fidgety/restless 0  Suicidal thoughts 0  PHQ-9 Score 0  Difficult doing work/chores Not difficult at all    There were no vitals filed for this visit.  Medications Reviewed Today     Reviewed by Clarnce Crow, RN (Registered Nurse) on 08/22/23 at 1308  Med List Status: <None>   Medication Order Taking? Sig Documenting Provider Last Dose Status Informant  acetaminophen  (TYLENOL ) 500 MG tablet [id number] Yes Take 500 mg by mouth in the morning, at noon, and at bedtime. [provider] Taking Active            Med Note Burley Carpenter, Raja Liska   Mon Jul 25, 2023  2:14 PM) Takes one in the morning and again at bedtime  albuterol  (PROVENTIL ) (2.5 MG/3ML) 0.083% nebulizer solution [id number] Yes TAKE 3 MLS BY NEBULIZER EVERY 6 HOURS AS NEEDED  FOR WHEEZING FOR SHORTNESS OF Lewie Rector, NP Taking Active Self, Pharmacy Records  amiodarone  (PACERONE ) 200 MG tablet [id number] Yes Take 1 tablet (200 mg total) by mouth daily. Ruddy Corral M, PA-C Taking Active   apixaban  (ELIQUIS ) 2.5 MG TABS tablet [id number] Yes Take 1 tablet (2.5 mg total) by  mouth 2 (two) times daily. Eilleen Grates, MD Taking Active   fluticasone -salmeterol (ADVAIR) 500-50 MCG/ACT AEPB [id number] Yes Inhale 1 puff into the lungs in the morning and at bedtime. [provider] Taking Active   gabapentin  (NEURONTIN ) 300 MG capsule [id number] Yes Take 1 capsule (300 mg total) by mouth at bedtime. Dorrene Gaucher, NP Taking Active Self, Pharmacy Records  leflunomide  (ARAVA ) 20 MG tablet [id number] Yes Take 1 tablet (20 mg total) by mouth daily. Dorrene Gaucher, NP Taking Active   metoprolol  succinate (TOPROL -XL) 50 MG 24 hr tablet [id number] No Take 0.5 tablets (25 mg total) by mouth daily. Take with or immediately following a meal.  Patient not taking: Reported on 08/22/2023   Eilleen Grates, MD Not Taking Active            Med Note Clarnce Crow   Mon Aug 22, 2023  1:07 PM) Patient to pick up prescription today  montelukast  (SINGULAIR ) 10 MG tablet [id number] Yes TAKE 1 TABLET BY MOUTH AT BEDTIME Dorrene Gaucher, NP Taking Active   rosuvastatin  (CRESTOR ) 10 MG tablet [id number] Yes Take 1 tablet (10 mg total) by mouth daily. Dorrene Gaucher, NP Taking Active   tamsulosin  (FLOMAX ) 0.4 MG CAPS capsule [id number] Yes Take 1 capsule by mouth once daily O'Sullivan, [name], NP Taking Active   torsemide  (DEMADEX ) 20 MG tablet [id number] Yes Take 2.5 tablets (50 mg total) by mouth daily. Dorrene Gaucher, NP Taking Active   Med List Note Louvenia Roys, CPhT 05/04/23 0405): [facility] [phone]            Recommendation:   Continue Current Plan of Care  Follow Up Plan:   Telephone follow up appointment date/time:  09/07/23   Clarnce Crow BSN RN CCM [place]  [facility], [facility] RN Care Manager Direct Dial: [phone] Fax: [phone]

## 2023-08-22 NOTE — Telephone Encounter (Signed)
 Already called patient back - see phone notes from today

## 2023-08-22 NOTE — Patient Instructions (Signed)
 Visit Information  Thank you for taking time to visit with me today. Please don't hesitate to contact me if I can be of assistance to you before our next scheduled appointment.  Our next appointment is by telephone on 09/07/23 at 2:00 Please call the care guide team at [phone] if you need to cancel or reschedule your appointment.   Following is a copy of your care plan:   Goals Addressed             This Visit's Progress    VBCI RN Care Plan   On track    Problems:  Chronic Disease Management support and education needs related to CHF, CKD Stage 4, and HTN  Goal: Over the next 30 days the Patient will attend all scheduled medical appointments: 08/22/23 Pharmacist, 09/01/23 Nephrology as evidenced by chart review and patient report        continue to work with RN Care Manager and/or Social Worker to address care management and care coordination needs related to CHF, CKD Stage 4, and HTN as evidenced by adherence to care management team scheduled appointments     demonstrate a decrease CHF, CKD Stage 4, and HTN in exacerbations as evidenced by chart review and patient report demonstrate Improved adherence to prescribed treatment plan for CHF, CKD Stage 4, and HTN as evidenced by attending all medical appointments, taking all medications and adherence to diet and lifestyle changes not experience hospital admission as evidenced by review of electronic medical record. Hospital Admissions in last 6 months = 2 take all medications exactly as prescribed and will call provider for medication related questions as evidenced by chart review and patient report     Interventions:   Heart Failure Interventions: Basic overview and discussion of pathophysiology of Heart Failure reviewed Provided education on low sodium diet Assessed need for readable accurate scales in home Provided education about placing scale on hard, flat surface Advised patient to weigh each morning after emptying  bladder Discussed importance of daily weight and advised patient to weigh and record daily Reviewed role of diuretics in prevention of fluid overload and management of heart failure; Discussed the importance of keeping all appointments with provider Provided patient with education about the role of exercise in the management of heart failure Screening for signs and symptoms of depression related to chronic disease state    Chronic Kidney Disease Interventions: Evaluation of current treatment plan related to chronic kidney disease self management and patient's adherence to plan as established by provider      Provided education to patient re: stroke prevention, s/s of heart attack and stroke    Reviewed prescribed diet low salt heart healthy Reviewed medications with patient and discussed importance of compliance    Advised patient, providing education and rationale, to monitor blood pressure daily and record, calling PCP for findings outside established parameters    Discussed complications of poorly controlled blood pressure such as heart disease, stroke, circulatory complications, vision complications, kidney impairment, sexual dysfunction    Discussed plans with patient for ongoing care management follow up and provided patient with direct contact information for care management team    Screening for signs and symptoms of depression related to chronic disease state      Assessed social determinant of health barriers         Hypertension Interventions: Last practice recorded BP readings:  BP Readings from Last 3 Encounters:  08/16/23 (!) 140/50  08/10/23 133/74  08/04/23 131/78   Most recent eGFR/CrCl:  Lab Results  Component Value Date   EGFR 26 (L) 08/16/2023    No components found for: "CRCL"  Evaluation of current treatment plan related to hypertension self management and patient's adherence to plan as established by provider Provided education to patient re: stroke  prevention, s/s of heart attack and stroke Reviewed medications with patient and discussed importance of compliance  Patient Self-Care Activities:  Attend all scheduled provider appointments Attend church or other social activities Call pharmacy for medication refills 3-7 days in advance of running out of medications Call provider office for new concerns or questions  Perform all self care activities independently  Take medications as prescribed   Work with the social worker to address care coordination needs and will continue to work with the clinical team to address health care and disease management related needs Work with the pharmacist to address medication management needs and will continue to work with the clinical team to address health care and disease management related needs check blood pressure daily choose a place to take my blood pressure (home, clinic or office, retail store) keep a blood pressure log take blood pressure log to all doctor appointments call doctor for signs and symptoms of high blood pressure keep all doctor appointments take medications for blood pressure exactly as prescribed  Plan:  Telephone follow up appointment with care management team member scheduled for:  062/25/5 at 2:00             Please call the Suicide and Crisis Lifeline: 988 call the [place]  National Suicide Prevention Lifeline: (360)568-1674 or TTY: (579)455-1753 TTY 5732027352) to talk to a trained counselor call 1-800-273-TALK (toll free, 24 hour hotline) if you are experiencing a Mental Health or Behavioral Health Crisis or need someone to talk to.  Patient verbalizes understanding of instructions and care plan provided today and agrees to view in MyChart. Active MyChart status and patient understanding of how to access instructions and care plan via MyChart confirmed with patient.      Clarnce Crow BSN RN CCM [place]  [facility], [facility] RN  Care Manager Direct Dial: [phone] Fax: [phone]

## 2023-08-23 ENCOUNTER — Ambulatory Visit: Payer: Self-pay

## 2023-08-23 DIAGNOSIS — M7989 Other specified soft tissue disorders: Secondary | ICD-10-CM

## 2023-08-23 NOTE — Telephone Encounter (Addendum)
 FYI Only or Action Required?: Action required by provider  Patient was last seen in primary care on 08/10/2023 by Dorrene Gaucher, NP. Called Nurse Triage reporting Leg Swelling. Symptoms began 1.5 days. Interventions attempted: Nothing. Symptoms are: gradually worsening.  Triage Disposition: Home Care: transferred pt to [facility]: pt spoke witj Henry Loge, RN  Patient/caregiver understands and will follow disposition?:      Copied from CRM [id number]. Topic: Clinical - Red Word Triage >> Aug 23, 2023  4:44 PM Kevelyn M wrote: Red Word that prompted transfer to Nurse Triage: Patient experiencing swelling in the left leg for the last day and a half. Answer Assessment - Initial Assessment Questions 1. ONSET: "When did the swelling start?" (e.g., minutes, hours, days)     1.5 days 2. LOCATION: "What part of the leg is swollen?"  "Are both legs swollen or just one leg?"     Mid thigh to ankle has swelling - moderate amount noted 3. SEVERITY: "How bad is the swelling?" (e.g., localized; mild, moderate, severe)   - Localized: Small area of swelling localized to one leg.   - MILD pedal edema: Swelling limited to foot and ankle, pitting edema < 1/4 inch (6 mm) deep, rest and elevation eliminate most or all swelling.   - MODERATE edema: Swelling of lower leg to knee, pitting edema > 1/4 inch (6 mm) deep, rest and elevation only partially reduce swelling.   - SEVERE edema: Swelling extends above knee, facial or hand swelling present.      moderate 4. REDNESS: "Does the swelling look red or infected?"     no 5. PAIN: "Is the swelling painful to touch?" If Yes, ask: "How painful is it?"   (Scale 1-10; mild, moderate or severe)     no 6. FEVER: "Do you have a fever?" If Yes, ask: "What is it, how was it measured, and when did it start?"      no 7. CAUSE: "What do you think is causing the leg swelling?"     unknown 8. MEDICAL HISTORY: "Do you have a history of blood clots (e.g., DVT), cancer, heart failure,  kidney disease, or liver failure?"     Blood clot in right arm 9. RECURRENT SYMPTOM: "Have you had leg swelling before?" If Yes, ask: "When was the last time?" "What happened that time?"     Yes - CHP program & pt reports if he gains any weight  or swelling in left leg patient needs to contact PCP office 10. OTHER SYMPTOMS: "Do you have any other symptoms?" (e.g., chest pain, difficulty breathing)       no 11. PREGNANCY: "Is there any chance you are pregnant?" "When was your last menstrual period?"       N/a  Pt was informed to call and notify PCP if left leg swelling or weight gain.  Protocols used: Leg Swelling and Edema-A-AH

## 2023-08-23 NOTE — Telephone Encounter (Signed)
 Spoke to pt- swelling LLE only.  Advised him that I would like him to get a LE doppler to rule out DVT.    Henry Loge, can you please make sure that US  gets scheduled for 6/11? Thank you!

## 2023-08-23 NOTE — Telephone Encounter (Signed)
 Nurse calling [facility] notified Brandon [name] office staff that pt stated he was informed if left leg swelling or weight gain to inform PCP.  Also spoke with Henry Loge, nurse and connected patient to Henry Loge for further assistant.

## 2023-08-23 NOTE — Telephone Encounter (Signed)
 Patient reports weight 177 lbtoday It was 176 lb It was 175 lb on 6/6 , 174 on 6/5/.

## 2023-08-23 NOTE — Telephone Encounter (Deleted)
 Reason for Disposition . [1] Localized swelling (e.g., small area of puffy or swollen skin) AND [2] itchy    Will make PCP aware to determine next steps for pt  Protocols used: Leg Swelling and Edema-A-AH

## 2023-08-24 ENCOUNTER — Ambulatory Visit (HOSPITAL_BASED_OUTPATIENT_CLINIC_OR_DEPARTMENT_OTHER)
Admission: RE | Admit: 2023-08-24 | Discharge: 2023-08-24 | Disposition: A | Discharge status: Home / Self Care | Source: Ambulatory Visit | Attending: Family | Admitting: Family

## 2023-08-24 DIAGNOSIS — N1831 Chronic kidney disease, stage 3a: Secondary | ICD-10-CM | POA: Diagnosis not present

## 2023-08-24 DIAGNOSIS — M7989 Other specified soft tissue disorders: Secondary | ICD-10-CM | POA: Diagnosis not present

## 2023-08-24 DIAGNOSIS — R600 Localized edema: Secondary | ICD-10-CM | POA: Diagnosis not present

## 2023-08-24 NOTE — Telephone Encounter (Signed)
 Patient was scheduled for tomorrow at his request. Patient advised provider prefers today, appointment moved to today at 7 pm (only available left).  Patient aware of new time and to come in through the ED

## 2023-08-25 ENCOUNTER — Ambulatory Visit (HOSPITAL_BASED_OUTPATIENT_CLINIC_OR_DEPARTMENT_OTHER)

## 2023-08-25 ENCOUNTER — Ambulatory Visit (INDEPENDENT_AMBULATORY_CARE_PROVIDER_SITE_OTHER): Admitting: Family Medicine

## 2023-08-25 ENCOUNTER — Ambulatory Visit: Payer: Self-pay | Admitting: Family

## 2023-08-25 ENCOUNTER — Encounter: Payer: Self-pay | Admitting: Family Medicine

## 2023-08-25 ENCOUNTER — Other Ambulatory Visit (HOSPITAL_BASED_OUTPATIENT_CLINIC_OR_DEPARTMENT_OTHER)

## 2023-08-25 VITALS — BP 118/69 | HR 65 | Ht 71.0 in | Wt 187.0 lb

## 2023-08-25 DIAGNOSIS — L03116 Cellulitis of left lower limb: Secondary | ICD-10-CM

## 2023-08-25 MED ORDER — DOXYCYCLINE HYCLATE 100 MG PO TABS: 100.0000 mg | ORAL_TABLET | Freq: Two times a day (BID) | ORAL | 0 refills | Status: AC

## 2023-08-25 NOTE — Telephone Encounter (Signed)
 Patient unable to come tomorrow, due to , going out of town this evening. He will come in at 2 pm to see Minna Amass NP

## 2023-08-25 NOTE — Patient Instructions (Signed)
 Start doxycycline  antibiotic - twice a day for 5 days (take with food and water, and sit upright for at least 45 minutes after taking; be cautious about prolonged sun exposure as this medicine could make you more sensitive to the sun) Keep legs clean  Elevated legs often Follow-up early next week to reevaluate Monitor for any new or worsening symptoms.

## 2023-08-25 NOTE — Progress Notes (Signed)
   Acute Office Visit  Subjective:     Patient ID: Brandon [name], male    DOB: Apr 15, 1940, 83 y.o.   MRN: [id number]  Chief Complaint  Patient presents with   Leg Problem    HPI Patient is in today for left leg swelling.    Patient called nurse triage regarding a few days of left lower leg swelling. He does have a history of CHF and recent VTE, currently on Eliquis . Based on telephone call, PCP ordered US  which ruled out DVT, but did show some edema in the left calf compatible with third-spacing or cellulitis. She requested patient come in for an appointment so we could evaluate his leg.  Today he reports leg is still swollen and feels tight but no severe pain. Denies any new numbness/tingling, chest pain, dyspnea, right leg symptoms, or rashes other than possibly  some mild edema, no weeping or drainage.      ROS All review of systems negative except what is listed in the HPI      Objective:    BP 118/69   Pulse 65   Ht 5' 11 (1.803 m)   Wt 187 lb (84.8 kg)   SpO2 100%   BMI 26.08 kg/m    Physical Exam Vitals reviewed.  Constitutional:      General: He is not in acute distress.    Appearance: Normal appearance. He is not ill-appearing.   Cardiovascular:     Rate and Rhythm: Normal rate and regular rhythm.  Pulmonary:     Effort: Pulmonary effort is normal.     Breath sounds: Normal breath sounds. No wheezing, rhonchi or rales.   Musculoskeletal:     Left lower leg: 3+ Edema present.   Skin:    Findings: Erythema present.   Neurological:     Mental Status: He is alert and oriented to person, place, and time.   Psychiatric:        Mood and Affect: Mood normal.        Behavior: Behavior normal.        Thought Content: Thought content normal.        Judgment: Judgment normal.         No results found for any visits on 08/25/23.      Assessment & Plan:   Problem List Items Addressed This Visit   None Visit Diagnoses       Cellulitis of  left lower extremity    -  Primary   Relevant Medications   doxycycline  (VIBRA -TABS) 100 MG tablet      Mild LLE erythema with warmth, and 3+ pitting edema. Right leg normal. Concern for cellulitis.  Start doxycycline  antibiotic - twice a day for 5 days (take with food and water, and sit upright for at least 45 minutes after taking; be cautious about prolonged sun exposure as this medicine could make you more sensitive to the sun) Keep legs clean  Elevated legs often Follow-up early next week to reevaluate Monitor for any new or worsening symptoms.    Meds ordered this encounter  Medications   doxycycline  (VIBRA -TABS) 100 MG tablet    Sig: Take 1 tablet (100 mg total) by mouth 2 (two) times daily for 5 days.    Dispense:  10 tablet    Refill:  0    Supervising Provider:   Randie Bustle A [[id number]]    Return in about 5 days (around 08/30/2023) for cellulitis follow-up.  Everlina Hock, NP

## 2023-08-25 NOTE — Telephone Encounter (Signed)
 Please advise pt that his ultrasound is negative for blood clot, but is concerning for infection/cellulitis. I would like to see him in the office please for evaluation.

## 2023-08-29 ENCOUNTER — Other Ambulatory Visit

## 2023-08-30 DIAGNOSIS — D631 Anemia in chronic kidney disease: Secondary | ICD-10-CM | POA: Diagnosis not present

## 2023-08-30 DIAGNOSIS — I480 Paroxysmal atrial fibrillation: Secondary | ICD-10-CM | POA: Diagnosis not present

## 2023-08-30 DIAGNOSIS — I714 Abdominal aortic aneurysm, without rupture, unspecified: Secondary | ICD-10-CM | POA: Diagnosis not present

## 2023-08-30 DIAGNOSIS — M6281 Muscle weakness (generalized): Secondary | ICD-10-CM | POA: Diagnosis not present

## 2023-08-30 DIAGNOSIS — J449 Chronic obstructive pulmonary disease, unspecified: Secondary | ICD-10-CM | POA: Diagnosis not present

## 2023-08-30 DIAGNOSIS — J45909 Unspecified asthma, uncomplicated: Secondary | ICD-10-CM | POA: Diagnosis not present

## 2023-08-30 DIAGNOSIS — Z8673 Personal history of transient ischemic attack (TIA), and cerebral infarction without residual deficits: Secondary | ICD-10-CM | POA: Diagnosis not present

## 2023-08-30 DIAGNOSIS — Z7901 Long term (current) use of anticoagulants: Secondary | ICD-10-CM | POA: Diagnosis not present

## 2023-08-30 DIAGNOSIS — I130 Hypertensive heart and chronic kidney disease with heart failure and stage 1 through stage 4 chronic kidney disease, or unspecified chronic kidney disease: Secondary | ICD-10-CM | POA: Diagnosis not present

## 2023-08-30 DIAGNOSIS — N184 Chronic kidney disease, stage 4 (severe): Secondary | ICD-10-CM | POA: Diagnosis not present

## 2023-08-30 DIAGNOSIS — Z87891 Personal history of nicotine dependence: Secondary | ICD-10-CM | POA: Diagnosis not present

## 2023-08-30 DIAGNOSIS — G4733 Obstructive sleep apnea (adult) (pediatric): Secondary | ICD-10-CM | POA: Diagnosis not present

## 2023-08-30 DIAGNOSIS — I272 Pulmonary hypertension, unspecified: Secondary | ICD-10-CM | POA: Diagnosis not present

## 2023-08-30 DIAGNOSIS — R5381 Other malaise: Secondary | ICD-10-CM | POA: Diagnosis not present

## 2023-08-30 DIAGNOSIS — I429 Cardiomyopathy, unspecified: Secondary | ICD-10-CM | POA: Diagnosis not present

## 2023-08-30 DIAGNOSIS — I5020 Unspecified systolic (congestive) heart failure: Secondary | ICD-10-CM | POA: Diagnosis not present

## 2023-08-31 ENCOUNTER — Ambulatory Visit (INDEPENDENT_AMBULATORY_CARE_PROVIDER_SITE_OTHER): Admitting: Family Medicine

## 2023-08-31 ENCOUNTER — Encounter: Payer: Self-pay | Admitting: Family Medicine

## 2023-08-31 VITALS — BP 117/75 | HR 65 | Ht 71.0 in | Wt 189.0 lb

## 2023-08-31 DIAGNOSIS — L03116 Cellulitis of left lower limb: Secondary | ICD-10-CM

## 2023-08-31 NOTE — Progress Notes (Signed)
 Acute Office Visit  Subjective:     Patient ID: Brandon [name], male    DOB: 04/30/1940, 83 y.o.   MRN: [id number]  Chief Complaint  Patient presents with   Medical Management of Chronic Issues    HPI Patient is in today for cellulitis follow-up.  Discussed the use of AI scribe software for clinical note transcription with the patient, who gave verbal consent to proceed.  History of Present Illness Brandon [name] is an 83 year old male with heart failure who presents for follow-up of a leg infection.  The leg infection has improved significantly with antibiotic treatment. He has no fever, chills, or other systemic symptoms.  He has a history of heart failure, which contributes to chronic swelling in his legs. He uses compression socks to manage this swelling and regularly monitors his weight to track fluid retention.  He experiences discomfort in his upper arm near shoulder/deltoid. The discomfort is positional, occurring when lying down, and is not currently painful. He has not noticed any swelling or skin changes. He is on Eliquis  due to a recent blood clot in the opposite arm.     ROS All review of systems negative except what is listed in the HPI      Objective:    BP 117/75   Pulse 65   Ht 5' 11 (1.803 m)   Wt 189 lb (85.7 kg)   SpO2 97%   BMI 26.36 kg/m    Physical Exam Vitals reviewed.  Constitutional:      General: He is not in acute distress.    Appearance: Normal appearance. He is not ill-appearing.   Cardiovascular:     Rate and Rhythm: Normal rate and regular rhythm.  Pulmonary:     Effort: Pulmonary effort is normal.     Breath sounds: Normal breath sounds. No wheezing, rhonchi or rales.   Musculoskeletal:        General: No swelling, tenderness or deformity.     Comments: BLE trace edema, no erythema/streaking/heat   Skin:    Findings: No bruising, erythema or lesion.   Neurological:     Mental Status: He is alert and oriented to  person, place, and time.   Psychiatric:        Mood and Affect: Mood normal.        Behavior: Behavior normal.        Thought Content: Thought content normal.        Judgment: Judgment normal.       No results found for any visits on 08/31/23.      Assessment & Plan:   Problem List Items Addressed This Visit   None Visit Diagnoses       Cellulitis of left lower extremity    -  Primary       Assessment & Plan Left shoulder/arm discomfort Positional discomfort possibly due to arthritis. Already on anticoagulation.  - Monitor symptoms  - Consider imaging if symptoms worsen/persist. Offered US  as he is nervous given recent VTE in other arm, but he would like to keep an eye on it for now. No red flags on exam. Continue Eliquis  as prescribed. May need xray for arthritis.   Cellulitis resolved with antibiotics. Trace edema bilaterally (CHF), not significantly pitting. - Continue use of compression socks. - Advise to elevate legs when sitting. - Monitor weight daily and report any sudden increase of more than 3 pounds overnight or 6 pounds in a week to the cardiologist.  No orders of the defined types were placed in this encounter.   Return if symptoms worsen or fail to improve.  Everlina Hock, NP

## 2023-09-01 DIAGNOSIS — N2581 Secondary hyperparathyroidism of renal origin: Secondary | ICD-10-CM | POA: Diagnosis not present

## 2023-09-01 DIAGNOSIS — N184 Chronic kidney disease, stage 4 (severe): Secondary | ICD-10-CM | POA: Diagnosis not present

## 2023-09-01 DIAGNOSIS — I5022 Chronic systolic (congestive) heart failure: Secondary | ICD-10-CM | POA: Diagnosis not present

## 2023-09-01 DIAGNOSIS — I129 Hypertensive chronic kidney disease with stage 1 through stage 4 chronic kidney disease, or unspecified chronic kidney disease: Secondary | ICD-10-CM | POA: Diagnosis not present

## 2023-09-04 ENCOUNTER — Other Ambulatory Visit: Payer: Self-pay | Admitting: Cardiology

## 2023-09-07 ENCOUNTER — Other Ambulatory Visit: Payer: Self-pay

## 2023-09-07 ENCOUNTER — Telehealth: Payer: Self-pay

## 2023-09-07 NOTE — Patient Outreach (Signed)
 Complex Care Management   Visit Note  09/07/2023  Name:  Brandon [name] MRN: [id number] DOB: Aug 25, 1940  Situation: Referral received for Complex Care Management related to Heart Failure and HTN I obtained verbal consent from Patient.  Visit completed with patient  on the phone  Background:   Past Medical History:  Diagnosis Date   Anemia    Arm DVT (deep venous thromboembolism), acute, right (HCC)    Asthma    Hx of childhood asthma, disappeared for a while, then resurfaced 6-7 years ago.    Cardiomyopathy    with a negative cardiac catheterization in the past. 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     Assessment: Patient Reported Symptoms:  Cognitive Cognitive Status: Alert and oriented to person, place, and time      Neurological Neurological Review of Symptoms: No symptoms reported    HEENT HEENT Symptoms Reported: No symptoms reported      Cardiovascular Cardiovascular Symptoms Reported: Swelling in legs or feet (patient is weighing daily and wearing compression stockings, taking diuretics as directed) Does patient have uncontrolled Hypertension?: Yes Is patient checking Blood Pressure at home?: Yes Cardiovascular Conditions: Hypertension, Heart failure Cardiovascular Management Strategies: Medication therapy, Routine screening, Weight management, Fluid modification, Coping strategies, Adequate rest Weight: 185 lb (83.9 kg)  Respiratory Respiratory Symptoms Reported: No symptoms reported    Endocrine Patient reports the following symptoms related to hypoglycemia or hyperglycemia : No symptoms reported Is patient diabetic?: No    Gastrointestinal Gastrointestinal Symptoms Reported: No symptoms reported      Genitourinary Genitourinary Symptoms Reported: No  symptoms reported, Frequency Additional Genitourinary Details: due to diuretics    Integumentary Integumentary Symptoms Reported: No symptoms reported Skin Management Strategies: Routine screening, Medication therapy  Musculoskeletal Musculoskelatal Symptoms Reviewed: Difficulty walking, Unsteady gait Additional Musculoskeletal Details: using cane, requests to continue [facility] PT/OT, RNCM to outreach to [facility] Musculoskeletal Conditions: Mobility limited Musculoskeletal Management Strategies: Routine screening, Exercise      Psychosocial Psychosocial Symptoms Reported: No symptoms reported     Do you feel physically threatened by others?: No      08/10/2023    2:27 PM  Depression screen PHQ 2/9  Decreased Interest 0  Down, Depressed, Hopeless 0  PHQ - 2 Score 0  Altered sleeping 0  Tired, decreased energy 0  Change in appetite 0  Feeling bad or failure about yourself  0  Trouble concentrating 0  Moving slowly or fidgety/restless 0  Suicidal thoughts 0  PHQ-9 Score 0  Difficult doing work/chores Not difficult at all    There were no vitals filed for this visit.  Medications Reviewed Today   Medications were not reviewed in this encounter     Recommendation:   Continue Current Plan of Care Continue to work with [facility] PT/OT as needed.  Follow Up Plan:   Telephone follow up appointment date/time:  09/23/23 at 1:00   Olam Idol BSN RN CCM [place]  [facility], [facility] RN Care Manager Direct Dial: [phone] Fax: [phone]

## 2023-09-07 NOTE — Telephone Encounter (Signed)
 Denies redness, reports swelling much better with compression stockings.  Advised pt no med changes at this time- continue to monitor/report worsening swelling or redness.

## 2023-09-07 NOTE — Telephone Encounter (Signed)
 Reason for CRM: Patient is having excessive fluid build up again in his leg and was told previously to notify [name] O'Sullivan once that happens again. He would like to know what he should do from here and if [name] would like to see him.  Patient reports no weight gain and swelling is better with no redness.

## 2023-09-07 NOTE — Patient Instructions (Signed)
 Visit Information  Thank you for taking time to visit with me today. Please don't hesitate to contact me if I can be of assistance to you before our next scheduled appointment.  Our next appointment is by telephone on 09/23/23 at 1:00 Please call the care guide team at [phone] if you need to cancel or reschedule your appointment.   Following is a copy of your care plan:   Goals Addressed             This Visit's Progress    VBCI RN Care Plan   On track    Problems:  Chronic Disease Management support and education needs related to CHF and HTN  Goal: Over the next 30 days the Patient will continue to work with Medical illustrator and/or Social Worker to address care management and care coordination needs related to CHF and HTN as evidenced by adherence to care management team scheduled appointments     demonstrate ongoing self health care management ability schedule dental exam as evidenced by     take all medications exactly as prescribed and will call provider for medication related questions as evidenced by chart review and patient report     Interventions:   Heart Failure Interventions: Basic overview and discussion of pathophysiology of Heart Failure reviewed Provided education on low sodium diet Assessed need for readable accurate scales in home Provided education about placing scale on hard, flat surface Advised patient to weigh each morning after emptying bladder Discussed importance of daily weight and advised patient to weigh and record daily Reviewed role of diuretics in prevention of fluid overload and management of heart failure; Discussed the importance of keeping all appointments with provider Provided patient with education about the role of exercise in the management of heart failure Screening for signs and symptoms of depression related to chronic disease state    Called [facility] to discuss decision to discharge patient, as he feels he still requires  strengthening.  Pending call back from Clinic Director.   Hypertension Interventions: Last practice recorded BP readings:  BP Readings from Last 3 Encounters:  08/31/23 117/75  08/25/23 118/69  08/16/23 (!) 140/50   Most recent eGFR/CrCl:  Lab Results  Component Value Date   EGFR 26 (L) 08/16/2023    No components found for: CRCL  Evaluation of current treatment plan related to hypertension self management and patient's adherence to plan as established by provider Provided education to patient re: stroke prevention, s/s of heart attack and stroke Reviewed medications with patient and discussed importance of compliance  Patient Self-Care Activities:  Attend all scheduled provider appointments Attend church or other social activities Call pharmacy for medication refills 3-7 days in advance of running out of medications Call provider office for new concerns or questions  Perform all self care activities independently  Take medications as prescribed   Work with the social worker to address care coordination needs and will continue to work with the clinical team to address health care and disease management related needs Work with the pharmacist to address medication management needs and will continue to work with the clinical team to address health care and disease management related needs check blood pressure daily choose a place to take my blood pressure (home, clinic or office, retail store) keep a blood pressure log take blood pressure log to all doctor appointments call doctor for signs and symptoms of high blood pressure keep all doctor appointments take medications for blood pressure exactly as prescribed  Plan:  Telephone follow up appointment with care management team member scheduled for:  07/11/25at 1:00             Please call the Suicide and Crisis Lifeline: 988 call the [place]  National Suicide Prevention Lifeline: 334-276-3507 or TTY: (757)091-0890 TTY  804-308-8978) to talk to a trained counselor call 1-800-273-TALK (toll free, 24 hour hotline) if you are experiencing a Mental Health or Behavioral Health Crisis or need someone to talk to.  Patient verbalizes understanding of instructions and care plan provided today and agrees to view in MyChart. Active MyChart status and patient understanding of how to access instructions and care plan via MyChart confirmed with patient.      Olam Idol BSN RN CCM [place]  [facility], [facility] Health RN Care Manager Direct Dial: [phone] Fax: [phone]

## 2023-09-09 ENCOUNTER — Other Ambulatory Visit: Payer: Self-pay | Admitting: Family

## 2023-09-09 MED ORDER — GABAPENTIN 300 MG PO CAPS: 300.0000 mg | ORAL_CAPSULE | Freq: Every day | ORAL | 0 refills | Status: DC

## 2023-09-09 MED ORDER — TORSEMIDE 20 MG PO TABS: 50.0000 mg | ORAL_TABLET | Freq: Every day | ORAL | 2 refills | Status: DC

## 2023-09-09 NOTE — Telephone Encounter (Signed)
 Copied from CRM [id number]. Topic: Clinical - Medication Refill >> Sep 09, 2023  1:37 PM Robinson H wrote: Medication: torsemide  (DEMADEX ) 20 MG tablet, gabapentin  (NEURONTIN ) 300 MG capsule  Has the patient contacted their pharmacy? Yes, states they are waiting on provider authorization (Agent: If no, request that the patient contact the pharmacy for the refill. If patient does not wish to contact the pharmacy document the reason why and proceed with request.) (Agent: If yes, when and what did the pharmacy advise?)  This is the patient's preferred pharmacy:  [facility] [address] [place], [place] - [address] [place] [address] Phone: [phone] Fax: [phone]    Is this the correct pharmacy for this prescription? Yes If no, delete pharmacy and type the correct one.   Has the prescription been filled recently? No  Is the patient out of the medication? Yes  Has the patient been seen for an appointment in the last year OR does the patient have an upcoming appointment? Yes  Can we respond through MyChart? Yes  Agent: Please be advised that Rx refills may take up to 3 business days. We ask that you follow-up with your pharmacy.

## 2023-09-13 ENCOUNTER — Telehealth: Payer: Self-pay

## 2023-09-13 NOTE — Telephone Encounter (Signed)
 Copied from CRM [id number]. Topic: Clinical - Prescription Issue >> Sep 13, 2023  2:16 PM Robinson H wrote: Reason for CRM: Patient is calling to speak with Tammy the pharmacist regarding amiodarone  (PACERONE ) 200 MG tablet medication, states she told him to reach back out if he had any issues obtaining medication, agent see's pending request as of 6/22.  Jarrell [phone]

## 2023-09-14 ENCOUNTER — Other Ambulatory Visit: Payer: Self-pay

## 2023-09-14 ENCOUNTER — Telehealth: Payer: Self-pay | Admitting: Family

## 2023-09-14 MED ORDER — AMIODARONE HCL 200 MG PO TABS: 200.0000 mg | ORAL_TABLET | Freq: Every day | ORAL | 3 refills | Status: AC

## 2023-09-14 NOTE — Telephone Encounter (Signed)
 Patient sees Dr Lavona for his afib treatment. I notified [facility] to out reach his office for refill instead of hospital provider that was on last Rx. If they have not heard back in 24 hours, could provide a temporary RX for 30 days but it appears he has seen Dr Lavona recently so I anticipate cardio will refill amiodarone .   Tried to contact patient to updated him about above but no answer - LM on VM.

## 2023-09-14 NOTE — Telephone Encounter (Unsigned)
 Copied from CRM #[id number]. Topic: Clinical - Prescription Issue >> Sep 14, 2023  9:01 AM Ernestene P wrote: Reason for CRM: Pharmacy called to check status of amiodarone  (PACERONE ) 200 MG tablet, stated multiple refill requests has been sent to office- want to know does pt continue the medication or will it get refilled   Callback number : [phone]

## 2023-09-19 DIAGNOSIS — N1832 Chronic kidney disease, stage 3b: Secondary | ICD-10-CM | POA: Diagnosis not present

## 2023-09-19 DIAGNOSIS — Z79899 Other long term (current) drug therapy: Secondary | ICD-10-CM | POA: Diagnosis not present

## 2023-09-19 DIAGNOSIS — M25552 Pain in left hip: Secondary | ICD-10-CM | POA: Diagnosis not present

## 2023-09-19 DIAGNOSIS — M25512 Pain in left shoulder: Secondary | ICD-10-CM | POA: Diagnosis not present

## 2023-09-19 DIAGNOSIS — M5136 Other intervertebral disc degeneration, lumbar region with discogenic back pain only: Secondary | ICD-10-CM | POA: Diagnosis not present

## 2023-09-19 DIAGNOSIS — M0579 Rheumatoid arthritis with rheumatoid factor of multiple sites without organ or systems involvement: Secondary | ICD-10-CM | POA: Diagnosis not present

## 2023-09-23 ENCOUNTER — Other Ambulatory Visit: Payer: Self-pay

## 2023-09-23 NOTE — Patient Instructions (Signed)
 Visit Information  Thank you for taking time to visit with me today. Please don't hesitate to contact me if I can be of assistance to you before our next scheduled appointment.  Our next appointment is by telephone on 10/06/23 at 2:00 Please call the care guide team at [phone] if you need to cancel or reschedule your appointment.   Following is a copy of your care plan:   Goals Addressed             This Visit's Progress    VBCI RN Care Plan   On track    Problems:  Chronic Disease Management support and education needs related to CHF and HTN  Goal: Over the next 30 days the Patient will continue to work with Medical illustrator and/or Social Worker to address care management and care coordination needs related to CHF and HTN as evidenced by adherence to care management team scheduled appointments     demonstrate ongoing self health care management ability schedule dental exam as evidenced by     take all medications exactly as prescribed and will call provider for medication related questions as evidenced by chart review and patient report     Interventions:   Heart Failure Interventions: Basic overview and discussion of pathophysiology of Heart Failure reviewed Provided education on low sodium diet Assessed need for readable accurate scales in home Provided education about placing scale on hard, flat surface Advised patient to weigh each morning after emptying bladder Discussed importance of daily weight and advised patient to weigh and record daily Reviewed role of diuretics in prevention of fluid overload and management of heart failure; Discussed the importance of keeping all appointments with provider Provided patient with education about the role of exercise in the management of heart failure Screening for signs and symptoms of depression related to chronic disease state    Patient to schedule face to face with PCP to discuss need for additional [facility] PT   Hypertension  Interventions: Last practice recorded BP readings:  BP Readings from Last 3 Encounters:  08/31/23 117/75  08/25/23 118/69  08/16/23 (!) 140/50   Most recent eGFR/CrCl:  Lab Results  Component Value Date   EGFR 26 (L) 08/16/2023    No components found for: CRCL  Evaluation of current treatment plan related to hypertension self management and patient's adherence to plan as established by provider Provided education to patient re: stroke prevention, s/s of heart attack and stroke Reviewed medications with patient and discussed importance of compliance  Patient Self-Care Activities:  Attend all scheduled provider appointments Attend church or other social activities Call pharmacy for medication refills 3-7 days in advance of running out of medications Call provider office for new concerns or questions  Perform all self care activities independently  Take medications as prescribed   Work with the social worker to address care coordination needs and will continue to work with the clinical team to address health care and disease management related needs Work with the pharmacist to address medication management needs and will continue to work with the clinical team to address health care and disease management related needs check blood pressure daily choose a place to take my blood pressure (home, clinic or office, retail store) keep a blood pressure log take blood pressure log to all doctor appointments call doctor for signs and symptoms of high blood pressure keep all doctor appointments take medications for blood pressure exactly as prescribed  Plan:  Telephone follow up appointment with care  management team member scheduled for:  10/06/23 at  2:00             Please call the Suicide and Crisis Lifeline: 988 call the [place]  National Suicide Prevention Lifeline: 223-624-7617 or TTY: (502)318-9352 TTY 959-668-9066) to talk to a trained counselor call 1-800-273-TALK (toll  free, 24 hour hotline) if you are experiencing a Mental Health or Behavioral Health Crisis or need someone to talk to.  Patient verbalizes understanding of instructions and care plan provided today and agrees to view in MyChart. Active MyChart status and patient understanding of how to access instructions and care plan via MyChart confirmed with patient.     SIGNATURE  Olam Idol BSN RN CCM [place]  [facility], [facility] Health RN Care Manager Direct Dial: [phone] Fax: [phone]

## 2023-09-23 NOTE — Patient Outreach (Signed)
 Complex Care Management   Visit Note  09/23/2023  Name:  Brandon [name] MRN: [id number] DOB: Jan 12, 1941  Situation: Referral received for Complex Care Management related to Heart Failure and HTN I obtained verbal consent from Patient.  Visit completed with patient  on the phone  Background:   Past Medical History:  Diagnosis Date   Anemia    Arm DVT (deep venous thromboembolism), acute, right (HCC)    Asthma    Hx of childhood asthma, disappeared for a while, then resurfaced 6-7 years ago.    Cardiomyopathy    with a negative cardiac catheterization in the past. 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     Assessment: Patient Reported Symptoms:  Cognitive Cognitive Status: Alert and oriented to person, place, and time      Neurological Neurological Review of Symptoms: No symptoms reported    HEENT HEENT Symptoms Reported: No symptoms reported      Cardiovascular Cardiovascular Symptoms Reported: Swelling in legs or feet (patient reports minimal LE edema, no weight gain) Does patient have uncontrolled Hypertension?: Yes Is patient checking Blood Pressure at home?: Yes Patient's Recent BP reading at home: BP readings typically WNL Cardiovascular Management Strategies: Routine screening, Medication therapy Do You Have a Working Readable Scale?: Yes Weight: 185 lb (83.9 kg) Cardiovascular Self-Management Outcome: 4 (good)  Respiratory Respiratory Symptoms Reported: No symptoms reported    Endocrine Endocrine Symptoms Reported: No symptoms reported Is patient diabetic?: No    Gastrointestinal Gastrointestinal Symptoms Reported: No symptoms reported      Genitourinary Genitourinary Symptoms Reported: Frequency Additional Genitourinary Details: due to  diuretics Genitourinary Management Strategies: Medication therapy  Integumentary Integumentary Symptoms Reported: No symptoms reported    Musculoskeletal Additional Musculoskeletal Details: using cane, no response from [facility], patient will schedule PCP visit to discuss need for ongoing [facility] PT Musculoskeletal Management Strategies: Exercise, Routine screening Falls in the past year?: No    Psychosocial Psychosocial Symptoms Reported: No symptoms reported     Quality of Family Relationships: helpful, involved, supportive      08/10/2023    2:27 PM  Depression screen PHQ 2/9  Decreased Interest 0  Down, Depressed, Hopeless 0  PHQ - 2 Score 0  Altered sleeping 0  Tired, decreased energy 0  Change in appetite 0  Feeling bad or failure about yourself  0  Trouble concentrating 0  Moving slowly or fidgety/restless 0  Suicidal thoughts 0  PHQ-9 Score 0  Difficult doing work/chores Not difficult at all    There were no vitals filed for this visit.  Medications Reviewed Today     Reviewed by Lonzell Planas, RN (Registered Nurse) on 09/23/23 at 1311  Med List Status: <None>   Medication Order Taking? Sig Documenting Provider Last Dose Status Informant  acetaminophen  (TYLENOL ) 500 MG tablet [id number]  Take 500 mg by mouth 2 (two) times daily. Morning and bedtime [provider]  Active            Med Note JUSTINO MADELIN KATHEE Pablo Aug 22, 2023  2:26 PM)    albuterol  (PROVENTIL ) (2.5 MG/3ML) 0.083% nebulizer solution [id number]  TAKE 3 MLS BY NEBULIZER EVERY 6 HOURS AS NEEDED FOR WHEEZING FOR SHORTNESS OF BREATH O'Sullivan, [name], NP  Active Self, Pharmacy Records  amiodarone  (PACERONE ) 200 MG tablet [id number]  Take 1 tablet (200 mg total) by mouth daily. Lavona Agent, MD  Active   apixaban  (ELIQUIS ) 2.5 MG TABS tablet [id number]  Take 1 tablet (2.5 mg total) by mouth 2 (two) times daily. Lavona Agent, MD  Active   cholecalciferol (VITAMIN D3) 25 MCG (1000 UNIT) tablet [id number]   Take 2,000 Units by mouth daily. [provider]  Active   fluticasone -salmeterol (ADVAIR) 500-50 MCG/ACT AEPB [id number]  Inhale 1 puff into the lungs in the morning and at bedtime. [provider]  Active   gabapentin  (NEURONTIN ) 300 MG capsule [id number]  Take 1 capsule (300 mg total) by mouth at bedtime. Daryl Setter, NP  Active   leflunomide  (ARAVA ) 20 MG tablet [id number]  Take 1 tablet (20 mg total) by mouth daily. Daryl Setter, NP  Active   metoprolol  succinate (TOPROL -XL) 50 MG 24 hr tablet [id number]  Take 0.5 tablets (25 mg total) by mouth daily. Take with or immediately following a meal. Lavona Agent, MD  Active            Med Note DANICE, Falana Clagg   Mon Aug 22, 2023  1:07 PM) Patient to pick up prescription today  montelukast  (SINGULAIR ) 10 MG tablet [id number]  TAKE 1 TABLET BY MOUTH AT BEDTIME O'Sullivan, [name], NP  Active   rosuvastatin  (CRESTOR ) 10 MG tablet [id number]  Take 1 tablet (10 mg total) by mouth daily. Daryl Setter, NP  Active   tamsulosin  (FLOMAX ) 0.4 MG CAPS capsule [id number]  Take 1 capsule by mouth once daily O'Sullivan, [name], NP  Active   torsemide  (DEMADEX ) 20 MG tablet [id number]  Take 2.5 tablets (50 mg total) by mouth daily. Daryl Setter, NP  Active   Med List Note Isabel Doneta RAMAN, CPhT 05/04/23 0405): [facility] [phone]            Recommendation:   Continue Current Plan of Care Schedule appointment with PCP to discuss [facility] PT needs  Follow Up Plan:   Telephone follow up appointment date/time:  10/06/23 at 2:00  SIG  Olam Idol BSN RN CCM [place]  [facility], [facility] RN Care Manager Direct Dial: [phone] Fax: [phone]

## 2023-09-28 ENCOUNTER — Other Ambulatory Visit: Payer: Self-pay | Admitting: Cardiology

## 2023-10-06 ENCOUNTER — Telehealth: Payer: Self-pay | Admitting: Pharmacist

## 2023-10-06 ENCOUNTER — Other Ambulatory Visit: Payer: Self-pay

## 2023-10-06 ENCOUNTER — Telehealth: Payer: Self-pay | Admitting: Family

## 2023-10-06 MED ORDER — TORSEMIDE 20 MG PO TABS: 50.0000 mg | ORAL_TABLET | Freq: Every day | ORAL | 2 refills | Status: DC

## 2023-10-06 NOTE — Telephone Encounter (Signed)
 It looks like it was filled today 7/24. Can you please notify pt and ask him to let us  know if he still can't get it filled?

## 2023-10-06 NOTE — Patient Instructions (Signed)
 Visit Information  Thank you for taking time to visit with me today. Please don't hesitate to contact me if I can be of assistance to you before our next scheduled appointment.  Our next appointment is no further scheduled appointments.  on  at  Please call the care guide team at [phone] if you need to cancel or reschedule your appointment.   Following is a copy of your care plan:   Goals Addressed             This Visit's Progress    COMPLETED: [facility] RN Care Plan   On track    Problems:  Chronic Disease Management support and education needs related to CHF and HTN  Goal: Over the next 30 days the Patient will continue to work with Medical illustrator and/or Social Worker to address care management and care coordination needs related to CHF and HTN as evidenced by adherence to care management team scheduled appointments     demonstrate ongoing self health care management ability schedule dental exam as evidenced by     take all medications exactly as prescribed and will call provider for medication related questions as evidenced by chart review and patient report     Interventions:   Heart Failure Interventions: Basic overview and discussion of pathophysiology of Heart Failure reviewed Provided education on low sodium diet Assessed need for readable accurate scales in home Provided education about placing scale on hard, flat surface Advised patient to weigh each morning after emptying bladder Discussed importance of daily weight and advised patient to weigh and record daily Reviewed role of diuretics in prevention of fluid overload and management of heart failure; Discussed the importance of keeping all appointments with provider Provided patient with education about the role of exercise in the management of heart failure Screening for signs and symptoms of depression related to chronic disease state    Patient to schedule face to face with PCP to discuss need for additional [facility]  PT   Hypertension Interventions: Last practice recorded BP readings:  BP Readings from Last 3 Encounters:  08/31/23 117/75  08/25/23 118/69  08/16/23 (!) 140/50   Most recent eGFR/CrCl:  Lab Results  Component Value Date   EGFR 26 (L) 08/16/2023    No components found for: CRCL  Evaluation of current treatment plan related to hypertension self management and patient's adherence to plan as established by provider Provided education to patient re: stroke prevention, s/s of heart attack and stroke Reviewed medications with patient and discussed importance of compliance  Patient Self-Care Activities:  Attend all scheduled provider appointments Attend church or other social activities Call pharmacy for medication refills 3-7 days in advance of running out of medications Call provider office for new concerns or questions  Perform all self care activities independently  Take medications as prescribed   Work with the social worker to address care coordination needs and will continue to work with the clinical team to address health care and disease management related needs Work with the pharmacist to address medication management needs and will continue to work with the clinical team to address health care and disease management related needs check blood pressure daily choose a place to take my blood pressure (home, clinic or office, retail store) keep a blood pressure log take blood pressure log to all doctor appointments call doctor for signs and symptoms of high blood pressure keep all doctor appointments take medications for blood pressure exactly as prescribed  Plan:  No further  follow up required:   The patient has been provided with contact information for the care management team and has been advised to call with any health related questions or concerns.              Please call the Suicide and Crisis Lifeline: 988 call the [place]  National Suicide Prevention Lifeline:  9108809870 or TTY: 7181518458 TTY (219) 632-8803) to talk to a trained counselor call 1-800-273-TALK (toll free, 24 hour hotline) if you are experiencing a Mental Health or Behavioral Health Crisis or need someone to talk to.  Patient verbalizes understanding of instructions and care plan provided today and agrees to view in MyChart. Active MyChart status and patient understanding of how to access instructions and care plan via MyChart confirmed with patient.     SIGNATURE  Olam Idol BSN RN CCM [place]  [facility], [facility] RN Care Manager Direct Dial: [phone] Fax: [phone]

## 2023-10-06 NOTE — Telephone Encounter (Signed)
 Looks like Rx from 09/09/2023 was no print / documented and not sent to pharmacy.  Sent in Rx for torsemide  20mg  take 2.5 tabs = 50mg  daily - #75 with 2 refills. Patient sees PCP in September 2025.

## 2023-10-06 NOTE — Telephone Encounter (Signed)
-----   Message from Nurse Olam MATSU sent at 10/06/2023  1:56 PM EDT ----- Patient reports was contacted by his pharmacy as they are unable to fill his refill request for torsemide  50 mg once daily.  He only has 20 mg tablets, and has run out early.     Olam Idol BSN RN CCM [place]  [facility], [facility] RN Care Manager Direct Dial: [phone] Fax:[phone]

## 2023-10-06 NOTE — Patient Outreach (Signed)
 Complex Care Management   Visit Note  10/06/2023  Name:  Brandon [name] MRN: [id number] DOB: October 25, 1940  Situation: Referral received for Complex Care Management related to Heart Failure and HTN I obtained verbal consent from Patient.  Visit completed with patient  on the phone  Background:   Past Medical History:  Diagnosis Date   Anemia    Arm DVT (deep venous thromboembolism), acute, right (HCC)    Asthma    Hx of childhood asthma, disappeared for a while, then resurfaced 6-7 years ago.    Cardiomyopathy    with a negative cardiac catheterization in the past. 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     Assessment: Patient Reported Symptoms:  Cognitive Cognitive Status: No symptoms reported, Alert and oriented to person, place, and time      Neurological Neurological Review of Symptoms: No symptoms reported    HEENT HEENT Symptoms Reported: No symptoms reported      Cardiovascular Cardiovascular Symptoms Reported: No symptoms reported Does patient have uncontrolled Hypertension?: Yes Is patient checking Blood Pressure at home?: Yes Patient's Recent BP reading at home: today 108/55 Cardiovascular Management Strategies: Medication therapy, Routine screening Do You Have a Working Readable Scale?: Yes Weight: 185 lb (83.9 kg)  Respiratory Respiratory Symptoms Reported: No symptoms reported    Endocrine Endocrine Symptoms Reported: No symptoms reported Is patient diabetic?: No    Gastrointestinal Gastrointestinal Symptoms Reported: No symptoms reported      Genitourinary Genitourinary Symptoms Reported: Frequency    Integumentary Integumentary Symptoms Reported: No symptoms reported    Musculoskeletal Additional Musculoskeletal Details: patient has fu appt with  PCP to discuss ongoing need for [facility] PT Musculoskeletal Management Strategies: Routine screening      Psychosocial Psychosocial Symptoms Reported: No symptoms reported            08/10/2023    2:27 PM  Depression screen PHQ 2/9  Decreased Interest 0  Down, Depressed, Hopeless 0  PHQ - 2 Score 0  Altered sleeping 0  Tired, decreased energy 0  Change in appetite 0  Feeling bad or failure about yourself  0  Trouble concentrating 0  Moving slowly or fidgety/restless 0  Suicidal thoughts 0  PHQ-9 Score 0  Difficult doing work/chores Not difficult at all    There were no vitals filed for this visit.  Medications Reviewed Today     Reviewed by Lonzell Planas, RN (Registered Nurse) on 10/06/23 at 1353  Med List Status: <None>   Medication Order Taking? Sig Documenting Provider Last Dose Status Informant  acetaminophen  (TYLENOL ) 500 MG tablet [id number]  Take 500 mg by mouth 2 (two) times daily. Morning and bedtime [provider]  Active            Med Note JUSTINO MADELIN KATHEE Pablo Aug 22, 2023  2:26 PM)    albuterol  (PROVENTIL ) (2.5 MG/3ML) 0.083% nebulizer solution [id number]  TAKE 3 MLS BY NEBULIZER EVERY 6 HOURS AS NEEDED FOR WHEEZING FOR SHORTNESS OF SHERIDA Daryl Setter, NP  Active Self, Pharmacy Records  amiodarone  (PACERONE ) 200 MG tablet [id number]  Take 1 tablet (200 mg total) by mouth daily. Lavona Agent, MD  Active   apixaban  (ELIQUIS ) 2.5 MG TABS tablet [id number]  Take  1 tablet (2.5 mg total) by mouth 2 (two) times daily. Lavona Agent, MD  Active   cholecalciferol (VITAMIN D3) 25 MCG (1000 UNIT) tablet [id number]  Take 2,000 Units by mouth daily. [provider]  Active   fluticasone -salmeterol (ADVAIR) 500-50 MCG/ACT AEPB [id number]  Inhale 1 puff into the lungs in the morning and at bedtime. [provider]  Active   gabapentin  (NEURONTIN ) 300 MG capsule [id number]  Take 1 capsule (300 mg total) by mouth at bedtime. Daryl Setter, NP   Active   leflunomide  (ARAVA ) 20 MG tablet [id number]  Take 1 tablet (20 mg total) by mouth daily. O'Sullivan, [name], NP  Active   metoprolol  succinate (TOPROL -XL) 50 MG 24 hr tablet [id number]  Take 0.5 tablets (25 mg total) by mouth daily. Take with or immediately following a meal. Lavona Agent, MD  Active            Med Note DANICE, Keilin Gamboa   Mon Aug 22, 2023  1:07 PM) Patient to pick up prescription today  montelukast  (SINGULAIR ) 10 MG tablet [id number]  TAKE 1 TABLET BY MOUTH AT BEDTIME Daryl Setter, NP  Active   rosuvastatin  (CRESTOR ) 10 MG tablet [id number]  Take 1 tablet (10 mg total) by mouth daily. Daryl Setter, NP  Active   tamsulosin  (FLOMAX ) 0.4 MG CAPS capsule [id number]  Take 1 capsule by mouth once daily O'Sullivan, [name], NP  Active   torsemide  (DEMADEX ) 20 MG tablet [id number]  Take 2.5 tablets (50 mg total) by mouth daily. Daryl Setter, NP  Active   Med List Note Isabel Doneta RAMAN, CPhT 05/04/23 0405): [facility] [phone]            Recommendation:   PCP Follow-up RNCM sent high priority message to clinical pharmacist and PCP as patient has been unable to get torsemide  refill and has no more medication on hand.  Follow Up Plan:   Patient has met all care management goals. Care Management case will be closed. Patient has been provided contact information should new needs arise.    SIG  Olam Idol BSN RN CCM [place]  [facility], [facility] RN Care Manager Direct Dial: [phone] Fax: [phone]

## 2023-10-06 NOTE — Telephone Encounter (Signed)
-----   Message from Nurse Olam MATSU sent at 10/06/2023  4:53 PM EDT ----- He says his pharmacy called and said they couldn't fill it?   Olam Idol BSN RN CCM [place]  [facility], [facility] RN Care Manager Direct Dial: [phone] Fax:[phone] ----- Message ----- From: Carla Milling, RPH-CPP Sent: 10/06/2023   4:38 PM EDT To: Olam Idol, RN; Eleanor Ponto, NP  He is taking 2.5 tabs of the 20mg  = 50mg  per day There was an Rx from 09/09/23 but it looks like it was no print - sent in Rx for #75 = 30 day supply with 2 refills. He sees PCP in September.  Tammy ----- Message ----- From: Idol Olam, RN Sent: 10/06/2023   1:56 PM EDT To: Milling Carla, RPH-CPP; Shaquasha Gerstel O'Sullivan, NP  Patient reports was contacted by his pharmacy as they are unable to fill his refill request for torsemide  50 mg once daily.  He only has 20 mg tablets, and has run out early.     Olam Idol BSN RN CCM [place]  [facility], [facility] RN Care Manager Direct Dial: [phone] Fax:[phone]

## 2023-10-07 NOTE — Telephone Encounter (Signed)
 Called patient and he confirmed pharmacy received rx and is raeady for pick up

## 2023-10-12 DIAGNOSIS — M1612 Unilateral primary osteoarthritis, left hip: Secondary | ICD-10-CM | POA: Diagnosis not present

## 2023-10-17 ENCOUNTER — Ambulatory Visit (INDEPENDENT_AMBULATORY_CARE_PROVIDER_SITE_OTHER): Admitting: Pharmacist

## 2023-10-17 DIAGNOSIS — I4819 Other persistent atrial fibrillation: Secondary | ICD-10-CM

## 2023-10-17 DIAGNOSIS — N1831 Chronic kidney disease, stage 3a: Secondary | ICD-10-CM

## 2023-10-17 DIAGNOSIS — I5020 Unspecified systolic (congestive) heart failure: Secondary | ICD-10-CM

## 2023-10-17 NOTE — Progress Notes (Signed)
 10/17/2023 Name: Brandon [name] MRN: [id number] DOB: 1940-11-25  Chief Complaint  Patient presents with   Medication Adherence   Medication Management    Brandon [name] is a 83 y.o. year old male who was referred for medication management by their primary care provider, Brandon Setter, NP. T   Patient self referred to Clinical Pharmacist Practitioner to discuss medication changes and medication management   Subjective:  Care Team: Primary Care Provider: Daryl Setter, NP ; Next Scheduled Visit: 12/13/2023 Cardiologist: Dr Lindi; Next Scheduled Visit: recall set for September 2025 Nephrologist: Dr Jerrye; Last Visit: 09/01/2023 - Next visit in September 2025.  Pulmonology: Next Visit 12/06/2023  Medication Access/Adherence  Current Pharmacy:  [facility] [address] [place], [place] - [address] [place] [address] Phone: [phone] Fax: [phone]  [name] [address] [place] [address] Phone: [phone] Fax: [phone]   Patient reports affordability concerns with their medications: No  - initial cost in 2025 for Eliquis  was high until patient met deductible, now cost is $47 per month.  Patient reports access/transportation concerns to their pharmacy: No  Patient reports adherence concerns with their medications:  No     04/15/2023 to 04/28/2023 Hospitalization: For cardiogenic shock in the setting of recurrent atrial fibrillation. Placed on lasix  gtt w/ milrinone  support for palliative diuresis. He diuresed well and was weaned off milrinone  and underwent successful DCCV. Given end-stage HF and progressive cardiorenal syndrome, palliative care team was consulted for GOC discussion. Patient and family opted to transition to comfort care. Palliative care team followed along for comfort/ symptom management. He was continued on amiodarone , gabapentin , inhalers, Singulair  and  torsemide  for comfort. All other meds were discontinued.   05/03/2023 - returned to hospital due to swelling in upper arm. Ultrasound showed DVT in upper right extrememity. Eliquis  was restarted at 5mg  twice a day.    Heart Failure (EF <20% - 05/10/2023):  Current medications:  ACEi/ARB/ARNI: no SGLT2i: no Beta blocker: yes - started metoprolol  succinate 08/16/2023 Mineralocorticoid Receptor Antagonist: no Diuretic regimen: yes - torsemide   Several medications were stopped during Feb 2025 hospitalization when family had opted for comfort / palliative care.   Current home blood pressure readings: SBP 102 to 120; DBP 52 to 62 Today;s blood pressure readings was 107/51 Current home weights: usually between 184 and 187 lbs Patient is checking weight daily and knows to contact office if weight increase by more than 2 lbs in 24 hours or 5 lbs in a week.   Patient denies volume overload signs or symptoms including no shortness of breath, lower extremity edema, increased use of pillows at night  Current physical activity: continues to do exercises he learned in PT.    Atrial Fibrillation and DVT to right uppper extremity 04/2023:  Current medications:  Rate Control: metoprolol  succinate ER 50mg  - take 0.5 tablet daily.  Rhythm Control: amiodarone  200mg  daily  Anticoagulation Regimen: Eliquis  2.5mg  twice a day (lowered back to 2.5mg  08/16/2023 - DVT treated for 3 to 4 months). Per patient he is taking 0.5 tablet of Eliquis    Medications tried in the past  CHA2DS2-VASc Score = 6  This indicates a 7.2% annual risk of stroke. The patient's score is based upon: CHF History: 1 HTN History: 1 Diabetes History: 0 Stroke History: 2 Vascular Disease History: 0 Age Score: 2 Gender Score: 0  BP Readings from Last 3 Encounters:  08/31/23 117/75  08/25/23 118/69  08/16/23 (!) 140/50    Patient denies hypotensive s/sx including no dizziness, lightheadedness. Patient denies hypertensive  symptoms including no headache, chest pain, shortness of breath  Objective:  Lab Results  Component Value Date   HGBA1C 5.1 04/18/2022    Lab Results  Component Value Date   CREATININE 2.43 (H) 08/16/2023   BUN 40 (H) 08/16/2023   NA 139 08/16/2023   K 4.7 08/16/2023   CL 100 08/16/2023   CO2 18 (L) 08/16/2023    Lab Results  Component Value Date   CHOL 112 04/16/2023   HDL 38 (L) 04/16/2023   LDLCALC 61 04/16/2023   TRIG 66 04/16/2023   CHOLHDL 2.9 04/16/2023    Medications Reviewed Today     Reviewed by Brandon [name], RPH-CPP (Pharmacist) on 10/17/23 at 1416  Med List Status: <None>   Medication Order Taking? Sig Documenting Provider Last Dose Status Informant  acetaminophen  (TYLENOL ) 500 MG tablet [id number] Yes Take 500 mg by mouth 2 (two) times daily. Morning and bedtime [provider]  Active            Med Note Brandon [name] Pablo Aug 22, 2023  2:26 PM)    albuterol  (PROVENTIL ) (2.5 MG/3ML) 0.083% nebulizer solution [id number] Yes TAKE 3 MLS BY NEBULIZER EVERY 6 HOURS AS NEEDED FOR WHEEZING FOR SHORTNESS OF Brandon [name] Brandon Setter, NP  Active Self, Pharmacy Records  amiodarone  (PACERONE ) 200 MG tablet [id number] Yes Take 1 tablet (200 mg total) by mouth daily. Brandon Agent, MD  Active   apixaban  (ELIQUIS ) 2.5 MG TABS tablet [id number] Yes Take 1 tablet (2.5 mg total) by mouth 2 (two) times daily. Brandon Agent, MD  Active   cholecalciferol (VITAMIN D3) 25 MCG (1000 UNIT) tablet [id number] Yes Take 2,000 Units by mouth daily. [provider]  Active   fluticasone -salmeterol (ADVAIR) 500-50 MCG/ACT AEPB [id number] Yes Inhale 1 puff into the lungs in the morning and at bedtime. [provider]  Active   gabapentin  (NEURONTIN ) 300 MG capsule [id number] Yes Take 1 capsule (300 mg total) by mouth at bedtime. Brandon Setter, NP  Active   leflunomide  (ARAVA ) 20 MG tablet [id number] Yes Take 1 tablet (20 mg total) by mouth daily. Brandon Setter, NP  Active   MEDROL  4 MG TBPK tablet [id number] Yes Take 4 mg by mouth See admin instructions. [provider]  Active   metoprolol  succinate (TOPROL -XL) 50 MG 24 hr tablet [id number] Yes Take 0.5 tablets (25 mg total) by mouth daily. Take with or immediately following a meal. Brandon Agent, MD  Active            Med Note DANICE, LISA   Mon Aug 22, 2023  1:07 PM) Patient to pick up prescription today  montelukast  (SINGULAIR ) 10 MG tablet [id number] Yes TAKE 1 TABLET BY MOUTH AT BEDTIME Brandon Setter, NP  Active   rosuvastatin  (CRESTOR ) 10 MG tablet [id number] Yes Take 1 tablet (10 mg total) by mouth daily. Brandon Setter, NP  Active   tamsulosin  (FLOMAX ) 0.4 MG CAPS capsule [id number] Yes Take 1 capsule by mouth once daily O'Sullivan, [name], NP  Active   torsemide  (DEMADEX ) 20 MG tablet [id number] Yes Take 2.5 tablets (50 mg total) by mouth daily. Brandon Setter, NP  Active   Med List Note Isabel Doneta RAMAN, CPhT 05/04/23 0405): [facility] [phone]              Assessment/Plan:   Heart Failure: Weight and edema stable. Several GDT stopped  during Feb 2025 hospitalizaiton for comfort / palliative care. - Reviewed appropriate blood pressure monitoring technique and reviewed goal blood pressure - Reviewed to weigh daily and when to contact cardiology with weight gain - Recommend to continue torsemide  and metoprolol     Atrial Fibrillation: - Reviewed importance of adherence to anticoagulant for stroke prevention and DVT recurrence prevention - Recommend to continue Eliquis  2.5mg  twice a day (completed 4 months of 5mg  twice a day for DVT treatment 04/2023 to 08/2023)   - Reviewed refill history - patient has filled all maintenance medications except his generic Advair inhaler - reminded him refill was due.   Follow Up Plan: 2 to 3 months.   Madelin Ray, PharmD Clinical Pharmacist [facility]

## 2023-10-27 ENCOUNTER — Other Ambulatory Visit: Payer: Self-pay | Admitting: Family

## 2023-11-05 ENCOUNTER — Other Ambulatory Visit: Payer: Self-pay | Admitting: Family

## 2023-11-09 DIAGNOSIS — N184 Chronic kidney disease, stage 4 (severe): Secondary | ICD-10-CM | POA: Diagnosis not present

## 2023-11-17 ENCOUNTER — Ambulatory Visit: Admitting: Cardiology

## 2023-11-24 DIAGNOSIS — I5022 Chronic systolic (congestive) heart failure: Secondary | ICD-10-CM | POA: Diagnosis not present

## 2023-11-24 DIAGNOSIS — N2581 Secondary hyperparathyroidism of renal origin: Secondary | ICD-10-CM | POA: Diagnosis not present

## 2023-11-24 DIAGNOSIS — D631 Anemia in chronic kidney disease: Secondary | ICD-10-CM | POA: Diagnosis not present

## 2023-11-24 DIAGNOSIS — N184 Chronic kidney disease, stage 4 (severe): Secondary | ICD-10-CM | POA: Diagnosis not present

## 2023-11-24 DIAGNOSIS — I129 Hypertensive chronic kidney disease with stage 1 through stage 4 chronic kidney disease, or unspecified chronic kidney disease: Secondary | ICD-10-CM | POA: Diagnosis not present

## 2023-11-25 ENCOUNTER — Encounter: Payer: Self-pay | Admitting: Student

## 2023-11-29 ENCOUNTER — Telehealth: Payer: Self-pay

## 2023-11-29 ENCOUNTER — Other Ambulatory Visit: Payer: Self-pay | Admitting: Family

## 2023-11-29 MED ORDER — ALBUTEROL SULFATE (2.5 MG/3ML) 0.083% IN NEBU: 2.5000 mg | INHALATION_SOLUTION | Freq: Four times a day (QID) | RESPIRATORY_TRACT | 3 refills | Status: AC | PRN

## 2023-11-29 NOTE — Telephone Encounter (Signed)
 Refill request was for albuterol  inhaler which is no longer on med list, not nebulizer solution earlier today. Rx sent for neb solution.

## 2023-11-29 NOTE — Telephone Encounter (Signed)
 Copied from CRM #[id number]. Topic: Clinical - Prescription Issue >> Nov 29, 2023  4:22 PM Chiquita SQUIBB wrote: Reason for CRM: Patient is calling in regarding his albuterol  (PROVENTIL ) (2.5 MG/3ML) 0.083% nebulizer solution  being denied. Patient is asking why this was denied and if Tammy the pharmacist can give a call back regarding it.

## 2023-12-06 ENCOUNTER — Ambulatory Visit: Admitting: Primary Care

## 2023-12-06 ENCOUNTER — Encounter: Payer: Self-pay | Admitting: Primary Care

## 2023-12-06 VITALS — BP 130/72 | HR 70 | Temp 97.3°F | Ht 71.0 in | Wt 199.8 lb

## 2023-12-06 DIAGNOSIS — J45909 Unspecified asthma, uncomplicated: Secondary | ICD-10-CM

## 2023-12-06 DIAGNOSIS — R634 Abnormal weight loss: Secondary | ICD-10-CM | POA: Diagnosis not present

## 2023-12-06 DIAGNOSIS — I509 Heart failure, unspecified: Secondary | ICD-10-CM

## 2023-12-06 DIAGNOSIS — Z87891 Personal history of nicotine dependence: Secondary | ICD-10-CM | POA: Diagnosis not present

## 2023-12-06 DIAGNOSIS — G4733 Obstructive sleep apnea (adult) (pediatric): Secondary | ICD-10-CM

## 2023-12-06 DIAGNOSIS — I4891 Unspecified atrial fibrillation: Secondary | ICD-10-CM

## 2023-12-06 NOTE — Progress Notes (Signed)
 @Patient  ID: Brandon [name], male    DOB: 1940/08/13, 83 y.o.   MRN: [id number]  No chief complaint on file.   Referring provider: Daryl Setter, NP  HPI: 83 year old male, former smoker. PMH significant for CHF, DVT, pulmonary HTN, asthma, OSA, rheumatoid arthritis, CKD, hyperlipidemia, obesity.   Sleep testing: HST 07/02/2021>> AHI 27.8 an hour with SPO2 low 84%  Weight    12/06/2023- Interim hx  Discussed the use of AI scribe software for clinical note transcription with the patient, who gave verbal consent to proceed.  History of Present Illness Brandon [name] is an 83 year old male with obstructive sleep apnea who presents for follow-up after hospitalization and CPAP non-compliance. He was referred by his primary care physician, [name], for evaluation after recent hospitalizations and CPAP non-compliance.  He has a history of moderate obstructive sleep apnea, diagnosed in April 2023, with 27.8 apneic events per hour and oxygen  saturation dropping to 84%. He has not used his CPAP machine since his recent hospitalizations in January and February 2025. He possesses a CPAP machine and some supplies but has not used it since his recent hospitalizations.  In January 2025, he was hospitalized for atrial fibrillation and subsequently admitted to rehab. In February 2025, he was hospitalized again for a blood clot in his right upper extremity. He is currently on Eliquis  and amiodarone  for atrial fibrillation and torsemide  for congestive heart failure. No recent episodes of atrial fibrillation are reported.  He reports a significant weight loss of 30 to 40 pounds, primarily fluid, during his hospitalizations. He notes that he is not snoring anymore and does not wake up gasping or choking. His typical bedtime is between 11 PM and 12 AM, and he wakes up between 10 AM and 11 AM. He states that it does not take long for him to fall asleep.   Allergies  Allergen Reactions   Ace  Inhibitors Cough   Isosorb Dinitrate-Hydralazine Other (See Comments)    dizziness/hypotensive    Immunization History  Administered Date(s) Administered   Fluad Quad(high Dose 65+) 11/21/2018, 12/15/2020, 12/14/2021   Fluad Trivalent(High Dose 65+) 12/20/2022   INFLUENZA, HIGH DOSE SEASONAL PF 02/26/2015, 12/09/2015, 04/22/2017, 12/14/2017   Influenza Split 02/24/2011, 12/03/2011   Influenza Whole 03/16/2007, 12/29/2009   Influenza,inj,Quad PF,6+ Mos 02/13/2013, 11/14/2013   Moderna Covid-19 Vaccine Bivalent Booster 66yrs & up 12/08/2020   Moderna SARS-COV2 Booster Vaccination 12/15/2020   Moderna Sars-Covid-2 Vaccination 04/08/2019, 05/06/2019, 11/27/2019, 07/02/2020   Pfizer(Comirnaty)Fall Seasonal Vaccine 12 years and older 12/13/2021   Pneumococcal Conjugate-13 11/14/2013   Pneumococcal Polysaccharide-23 08/13/2009   Td 07/26/2006   Tdap 09/22/2012, 05/23/2017   Zoster Recombinant(Shingrix ) 04/15/2021   Zoster, Live 05/17/2011    Past Medical History:  Diagnosis Date   Anemia    Arm DVT (deep venous thromboembolism), acute, right (HCC)    Asthma    Hx of childhood asthma, disappeared for a while, then resurfaced 6-7 years ago.    Cardiomyopathy    with a negative cardiac catheterization in the past. 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     Tobacco History: Social History   Tobacco Use  Smoking Status Former  Smokeless Tobacco Never  Tobacco Comments   quit  smoling in 1990. started when 18, 1 ppd   Counseling given: Not Answered Tobacco comments: quit smoling in 1990. started when 18, 1 ppd   Outpatient Medications Prior to Visit  Medication Sig Dispense Refill   acetaminophen  (TYLENOL ) 500 MG tablet Take 500 mg by mouth 2 (two) times daily. Morning and  bedtime     albuterol  (PROVENTIL ) (2.5 MG/3ML) 0.083% nebulizer solution Take 3 mLs (2.5 mg total) by nebulization every 6 (six) hours as needed for wheezing or shortness of breath. 150 mL 3   amiodarone  (PACERONE ) 200 MG tablet Take 1 tablet (200 mg total) by mouth daily. 90 tablet 3   apixaban  (ELIQUIS ) 2.5 MG TABS tablet Take 1 tablet (2.5 mg total) by mouth 2 (two) times daily. 90 tablet 3   cholecalciferol (VITAMIN D3) 25 MCG (1000 UNIT) tablet Take 2,000 Units by mouth daily.     fluticasone -salmeterol (ADVAIR) 500-50 MCG/ACT AEPB Inhale 1 puff into the lungs in the morning and at bedtime.     gabapentin  (NEURONTIN ) 300 MG capsule Take 1 capsule (300 mg total) by mouth at bedtime. 90 capsule 0   leflunomide  (ARAVA ) 20 MG tablet Take 1 tablet (20 mg total) by mouth daily.     MEDROL  4 MG TBPK tablet Take 4 mg by mouth See admin instructions.     metoprolol  succinate (TOPROL -XL) 50 MG 24 hr tablet Take 0.5 tablets (25 mg total) by mouth daily. Take with or immediately following a meal. 90 tablet 3   montelukast  (SINGULAIR ) 10 MG tablet TAKE 1 TABLET BY MOUTH AT BEDTIME 90 tablet 0   rosuvastatin  (CRESTOR ) 10 MG tablet Take 1 tablet (10 mg total) by mouth daily. 90 tablet 3   tamsulosin  (FLOMAX ) 0.4 MG CAPS capsule Take 1 capsule by mouth once daily 90 capsule 0   torsemide  (DEMADEX ) 20 MG tablet Take 2.5 tablets (50 mg total) by mouth daily. 75 tablet 2   No facility-administered medications prior to visit.   Review of Systems  Review of Systems  Constitutional:  Negative for fatigue.  Respiratory:  Negative for apnea and shortness of breath.   Cardiovascular:  Negative for leg swelling.  Psychiatric/Behavioral:  Negative for sleep disturbance.    Physical Exam  There were no vitals taken for this visit. Physical Exam Constitutional:      General: He is not in acute distress.    Appearance: Normal appearance. He is not ill-appearing.  HENT:     Head: Normocephalic and atraumatic.   Cardiovascular:     Rate and Rhythm: Normal rate and regular rhythm.  Pulmonary:     Effort: Pulmonary effort is normal.     Breath sounds: Normal breath sounds. No wheezing or rhonchi.  Musculoskeletal:        General: Normal range of motion.  Skin:    General: Skin is warm and dry.  Neurological:     General: No focal deficit present.     Mental Status: He is alert and oriented to person, place, and time. Mental status is at baseline.  Psychiatric:        Mood and Affect: Mood normal.        Behavior: Behavior normal.        Thought Content: Thought content normal.        Judgment: Judgment normal.     Lab Results:  CBC    Component Value Date/Time   WBC 8.7 05/04/2023 0520   RBC 3.97 (L) 05/04/2023 0520   HGB 12.5 (L)  05/04/2023 0520   HGB 12.7 (L) 04/24/2019 1343   HCT 37.5 (L) 05/04/2023 0520   PLT 198 05/04/2023 0520   PLT 285 04/24/2019 1343   MCV 94.5 05/04/2023 0520   MCH 31.5 05/04/2023 0520   MCHC 33.3 05/04/2023 0520   RDW 15.1 05/04/2023 0520   LYMPHSABS 0.5 (L) 05/03/2023 2146   MONOABS 0.7 05/03/2023 2146   EOSABS 0.0 05/03/2023 2146   BASOSABS 0.0 05/03/2023 2146    BMET    Component Value Date/Time   NA 139 08/16/2023 1632   K 4.7 08/16/2023 1632   CL 100 08/16/2023 1632   CO2 18 (L) 08/16/2023 1632   GLUCOSE 87 08/16/2023 1632   GLUCOSE 84 06/13/2023 1327   BUN 40 (H) 08/16/2023 1632   CREATININE 2.43 (H) 08/16/2023 1632   CREATININE 1.55 (H) 04/24/2019 1343   CREATININE 1.40 (H) 04/12/2019 1317   CALCIUM  9.5 08/16/2023 1632   GFRNONAA 22 (L) 05/04/2023 0520   GFRNONAA 42 (L) 04/24/2019 1343   GFRNONAA 48 (L) 04/12/2019 1317   GFRAA 44 (L) 11/02/2019 1501   GFRAA 49 (L) 04/24/2019 1343   GFRAA 55 (L) 04/12/2019 1317    BNP    Component Value Date/Time   BNP 2,612.3 (H) 04/15/2023 1627    ProBNP    Component Value Date/Time   PROBNP 1,464.0 (H) 03/22/2022 1002    Imaging: No results found.   Assessment & Plan:   1.  OSA (obstructive sleep apnea) (Primary)  2. Uncomplicated asthma, unspecified asthma severity, unspecified whether persistent  Assessment and Plan Assessment & Plan Obstructive sleep apnea with recent significant weight loss Moderate obstructive sleep apnea diagnosed in April 2023 with 27.8 apneic events per hour and oxygen  desaturation to 84%. Non-compliance with CPAP therapy.. Significant weight loss of approximately 33 pounds since last sleep study, potentially impacting the severity of sleep apnea. Weight loss can improve or resolve sleep apnea, necessitating reassessment. - Order repeat sleep study to reassess obstructive sleep apnea due to significant weight loss along with titration study  - Advise him to wear CPAP for a minimum of four hours per night. - Schedule follow-up appointment in 4-6 weeks to review CPAP data and assess compliance. - Provide him with educational materials on CPAP use and maintenance.  Atrial fibrillation Atrial fibrillation with recent hospitalization in January. Untreated sleep apnea can increase the risk of atrial fibrillation due to stress on the heart from apneic events. Current management includes Eliquis  and amiodarone  for rhythm control.  Heart failure Heart failure managed with torsemide  for fluid overload. Untreated sleep apnea can exacerbate heart failure by causing stress on the heart and contributing to volume overload. - Continue Torsemide  as directed    Almarie LELON Ferrari, NP 12/06/2023

## 2023-12-06 NOTE — Patient Instructions (Signed)
 VISIT SUMMARY: Today, you had a follow-up appointment to discuss your obstructive sleep apnea and recent hospitalizations. We reviewed your history of moderate obstructive sleep apnea, your recent weight loss, and your non-compliance with CPAP therapy. We also discussed your atrial fibrillation and heart failure management.  YOUR PLAN: -OBSTRUCTIVE SLEEP APNEA: Obstructive sleep apnea is a condition where your breathing stops and starts repeatedly during sleep. Your recent significant weight loss may have impacted the severity of your sleep apnea. We will order a repeat sleep study to reassess your condition. If the sleep study re-confirms diagnosis of sleep apnea we will resume using your CPAP machine with your existing supplies and wear it for at least four hours each night.  -ATRIAL FIBRILLATION: Atrial fibrillation is an irregular and often rapid heart rate that can increase your risk of strokes, heart failure, and other heart-related complications. It can be worsened by untreated sleep apnea. You are currently managing this condition with amiodarone  for rhythm control.  -HEART FAILURE: Heart failure is a condition where your heart cannot pump blood as well as it should. Untreated sleep apnea can make heart failure worse by putting extra stress on your heart. You are currently managing this condition with torsemide  to help reduce fluid overload.  Follow-up 6-8 weeks with Landry NP / after split night sleep study   Sleep Apnea  Sleep apnea is a condition that affects your breathing while you are sleeping. Your tongue or soft tissue in your throat may block the flow of air while you sleep. You may have shallow breathing or stop breathing for short periods of time. People with sleep apnea may snore loudly. There are three kinds of sleep apnea: Obstructive sleep apnea. This kind is caused by a blocked or collapsed airway. This is the most common. Central sleep apnea. This kind happens when the  part of the brain that controls breathing does not send the correct signals to the muscles that control breathing. Mixed sleep apnea. This is a combination of obstructive and central sleep apnea. What are the causes? The most common cause of sleep apnea is a collapsed or blocked airway. What increases the risk? Being very overweight. Having family members with sleep apnea. Having a tongue or tonsils that are larger than normal. Having a small airway or jaw problems. Being older. What are the signs or symptoms? Loud snoring. Restless sleep. Trouble staying asleep. Being sleepy or tired during the day. Waking up gasping or choking. Having a headache in the morning. Mood swings. Having a hard time remembering things and concentrating. How is this diagnosed? A medical history. A physical exam. A sleep study. This is also called a polysomnography test. This test is done at a sleep lab or in your home while you are sleeping. How is this treated? Treatment may include: Sleeping on your side. Losing weight if you're overweight. Wearing an oral appliance. This is a mouthpiece that moves your lower jaw forward. Using a positive airway pressure (PAP) device to keep your airways open while you sleep, such as: A continuous positive airway pressure (CPAP) device. This device gives forced air through a mask when you breathe out. This keeps your airways open. A bilevel positive airway pressure (BIPAP) device. This device gives forced air through a mask when you breathe in and when you breathe out to keep your airways open. Having surgery if other treatments do not work. If your sleep apnea is not treated, you may be at risk for: Heart failure. Heart attack. Stroke.  Type 2 diabetes or a problem with your blood sugar called insulin resistance. Follow these instructions at home: Medicines Take your medicines only as told by your health care provider. Avoid alcohol , medicines to help you relax,  and certain pain medicines. These may make sleep apnea worse. General instructions Do not smoke, vape, or use products with nicotine or tobacco in them. If you need help quitting, talk with your provider. If you were given a PAP device to open your airway while you sleep, use it as told by your provider. If you're having surgery, make sure to tell your provider you have sleep apnea. You may need to bring your PAP device with you. Contact a health care provider if: The PAP device that you were given to use during sleep bothers you or does not seem to be working. You do not feel better or you feel worse. Get help right away if: You have trouble breathing. You have chest pain. You have trouble talking. One side of your body feels weak. A part of your face is hanging down. These symptoms may be an emergency. Call 911 right away. Do not wait to see if the symptoms will go away. Do not drive yourself to the hospital. This information is not intended to replace advice given to you by your health care provider. Make sure you discuss any questions you have with your health care provider. Document Revised: 12/02/2022 Document Reviewed: 05/06/2022 Elsevier Patient Education  2024 [employer].

## 2023-12-11 ENCOUNTER — Other Ambulatory Visit: Payer: Self-pay | Admitting: Family

## 2023-12-13 ENCOUNTER — Encounter: Payer: Self-pay | Admitting: Family

## 2023-12-13 ENCOUNTER — Ambulatory Visit (INDEPENDENT_AMBULATORY_CARE_PROVIDER_SITE_OTHER): Admitting: Family

## 2023-12-13 VITALS — BP 120/54 | HR 60 | Temp 99.2°F | Resp 16 | Ht 71.0 in | Wt 204.0 lb

## 2023-12-13 DIAGNOSIS — G4733 Obstructive sleep apnea (adult) (pediatric): Secondary | ICD-10-CM

## 2023-12-13 DIAGNOSIS — N401 Enlarged prostate with lower urinary tract symptoms: Secondary | ICD-10-CM | POA: Diagnosis not present

## 2023-12-13 DIAGNOSIS — I513 Intracardiac thrombosis, not elsewhere classified: Secondary | ICD-10-CM | POA: Diagnosis not present

## 2023-12-13 DIAGNOSIS — I272 Pulmonary hypertension, unspecified: Secondary | ICD-10-CM | POA: Diagnosis not present

## 2023-12-13 DIAGNOSIS — G629 Polyneuropathy, unspecified: Secondary | ICD-10-CM | POA: Diagnosis not present

## 2023-12-13 DIAGNOSIS — N184 Chronic kidney disease, stage 4 (severe): Secondary | ICD-10-CM | POA: Diagnosis not present

## 2023-12-13 DIAGNOSIS — I4819 Other persistent atrial fibrillation: Secondary | ICD-10-CM

## 2023-12-13 DIAGNOSIS — E538 Deficiency of other specified B group vitamins: Secondary | ICD-10-CM

## 2023-12-13 DIAGNOSIS — J45909 Unspecified asthma, uncomplicated: Secondary | ICD-10-CM | POA: Diagnosis not present

## 2023-12-13 DIAGNOSIS — I10 Essential (primary) hypertension: Secondary | ICD-10-CM | POA: Diagnosis not present

## 2023-12-13 DIAGNOSIS — M069 Rheumatoid arthritis, unspecified: Secondary | ICD-10-CM | POA: Diagnosis not present

## 2023-12-13 MED ORDER — MONTELUKAST SODIUM 10 MG PO TABS
10.0000 mg | ORAL_TABLET | Freq: Every day | ORAL | 1 refills | Status: AC
Start: 2023-12-13 — End: ?

## 2023-12-13 MED ORDER — ALBUTEROL SULFATE HFA 108 (90 BASE) MCG/ACT IN AERS: 2.0000 | INHALATION_SPRAY | Freq: Four times a day (QID) | RESPIRATORY_TRACT | 5 refills | Status: AC | PRN

## 2023-12-13 MED ORDER — GABAPENTIN 300 MG PO CAPS: 300.0000 mg | ORAL_CAPSULE | Freq: Every day | ORAL | 5 refills | Status: AC

## 2023-12-13 MED ORDER — TAMSULOSIN HCL 0.4 MG PO CAPS: 0.4000 mg | ORAL_CAPSULE | Freq: Every day | ORAL | 1 refills | Status: AC

## 2023-12-13 NOTE — Assessment & Plan Note (Signed)
 Resolved on follow up echo, 2/25.

## 2023-12-13 NOTE — Assessment & Plan Note (Signed)
 Stable on Leflunomide , management per Rheumatology- Dr. Mai.

## 2023-12-13 NOTE — Assessment & Plan Note (Addendum)
 Stable on monteleukast/albuterol /advair. Continue same.

## 2023-12-13 NOTE — Assessment & Plan Note (Signed)
 Stable on amiodarone /metoprolol /eliquis .  Followed by cardiology- Dr. Lavona, rate stable.

## 2023-12-13 NOTE — Patient Instructions (Signed)
 VISIT SUMMARY:  You had a follow-up visit to review your medications for asthma, atrial fibrillation, and other health conditions. We discussed your difficulty in obtaining a refill for your rescue inhaler and addressed your current treatments for various conditions.  YOUR PLAN:  ASTHMA: Your asthma is managed with montelukast , Advair inhaler and albuterol  inhaler. -A prescription for an albuterol  inhaler has been sent to [facility] with multiple refills. -We ensured your montelukast  prescription is up to date.  ATRIAL FIBRILLATION: Your atrial fibrillation is managed with Eliquis , amiodarone , and metoprolol  under the care of Dr. Lavona.   OBSTRUCTIVE SLEEP APNEA: You have moderate obstructive sleep apnea and use a CPAP machine, which you find uncomfortable. -You have a sleep study scheduled in November to reassess the necessity of CPAP and explore mask options.  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 -A prescription for gabapentin  with refills has been sent.  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  BENIGN PROSTATIC HYPERPLASIA WITH LOWER URINARY TRACT SYMPTOMS: Your benign prostatic hyperplasia is managed with tamsulosin . -A prescription for tamsulosin  with refills has been sent. -Your PSA levels will be updated today.  RHEUMATOID ARTHRITIS: Your rheumatoid arthritis is managed with leflunomide  under the care of a rheumatologist.  HYPERLIPIDEMIA: Your hyperlipidemia is well-controlled with Crestor , and your recent cholesterol levels are satisfactory.  GENERAL HEALTH MAINTENANCE: You received your flu shot last week and are considering a COVID booster. -It is recommended that you receive the COVID booster due to your age and health conditions.

## 2023-12-13 NOTE — Progress Notes (Signed)
 \  Subjective:     Patient ID: Brandon [name], male    DOB: February 28, 1941, 83 y.o.   MRN: [id number]  Chief Complaint  Patient presents with   Hypertension    Here for follow up    Hypertension    Discussed the use of AI scribe software for clinical note transcription with the patient, who gave verbal consent to proceed.  History of Present Illness  Brandon [name] is an 83 year old male with asthma and atrial fibrillation who presents for a follow-up on his medications.  He is having difficulty obtaining a refill for his rescue inhaler but has sufficient nebulizer solution. He takes montelukast  daily and uses an Advair inhaler, which was recently refilled. For atrial fibrillation, he takes Eliquis , amiodarone , and metoprolol  and follows up with a cardiologist.  He manages arthritis with leflunomide  and recently completed a course of prednisone  for hip pain due to upcoming travel plans. He takes tamsulosin  for urinary symptoms with no issues reported. Occasional swelling is managed with compression stockings.  He uses a CPAP machine for sleep apnea and has an upcoming sleep study in November. Gabapentin  provides partial relief for neuropathy pain in his feet. He has received his flu shot.      Health Maintenance Due  Topic Date Due   Zoster Vaccines- Shingrix  (2 of 2) 06/10/2021    Past Medical History:  Diagnosis Date   Anemia    Arm DVT (deep venous thromboembolism), acute, right (HCC)    Asthma    Hx of childhood asthma, disappeared for a while, then resurfaced 6-7 years ago.    Cardiogenic shock (HCC) 04/16/2023   Cardiomyopathy    with a negative cardiac catheterization in the past. 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   Unspecified disorder resulting from impaired renal  function     Past Surgical History:  Procedure Laterality Date   CARDIOVERSION N/A 02/28/2023   Procedure: CARDIOVERSION;  Surgeon: Pietro Redell RAMAN, MD;  Location: [facility];  Service: Cardiovascular;  Laterality: N/A;   CARDIOVERSION N/A 04/19/2023   Procedure: CARDIOVERSION;  Surgeon: Gardenia Led, DO;  Location: [facility];  Service: Cardiovascular;  Laterality: N/A;   None     RIGHT HEART CATH N/A 01/13/2023   Procedure: RIGHT HEART CATH;  Surgeon: Gardenia Led, DO;  Location: [facility];  Service: Cardiovascular;  Laterality: N/A;   TRANSESOPHAGEAL ECHOCARDIOGRAM (CATH LAB) N/A 01/14/2023   Procedure: TRANSESOPHAGEAL ECHOCARDIOGRAM;  Surgeon: Francyne Headland, MD;  Location: [facility];  Service: Cardiovascular;  Laterality: N/A;   TRANSESOPHAGEAL ECHOCARDIOGRAM (CATH LAB) N/A 02/28/2023   Procedure: TRANSESOPHAGEAL ECHOCARDIOGRAM;  Surgeon: Pietro Redell RAMAN, MD;  Location: [facility];  Service: Cardiovascular;  Laterality: N/A;   TRANSESOPHAGEAL ECHOCARDIOGRAM (CATH LAB) N/A 04/19/2023   Procedure: TRANSESOPHAGEAL ECHOCARDIOGRAM;  Surgeon: Gardenia Led, DO;  Location: [facility];  Service: Cardiovascular;  Laterality: N/A;    Family History  Problem Relation Age of Onset   Multiple myeloma Father    Lupus Sister        died at 14   Diabetes Mellitus II Sister        died from covid-19   Neuropathy Sister  Asthma Daughter    Colon cancer Neg Hx    Esophageal cancer Neg Hx    Stomach cancer Neg Hx    Pancreatic cancer Neg Hx     Social History   Socioeconomic History   Marital status: Married    Spouse name: Lindajo   Number of children: 3   Years of education: Not on file   Highest education level: Bachelor's degree (e.g., BA, AB, BS)  Occupational History   Occupation: retired  Tobacco Use   Smoking status: Former   Smokeless tobacco: Never   Tobacco comments:    quit smoling in 12/27/1988. started when 18, 1  ppd  Vaping Use   Vaping status: Never Used  Substance and Sexual Activity   Alcohol  use: Not Currently    Comment: occ   Drug use: Never   Sexual activity: Not Currently  Other Topics Concern   Not on file  Social History Narrative   Grew up in [place], attended [employer]. First wife died of breast ca in 12/27/97. 3 children. Remarried- 8 years.    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    Pt signed designated party release granting access to [facility] to his wife Lindajo. Detailed message may be left on home or cell phone.    Social Drivers of Corporate investment banker Strain: Low Risk  (08/09/2023)   Overall Financial Resource Strain (CARDIA)    Difficulty of Paying Living Expenses: Not hard at all  Food Insecurity: No Food Insecurity (09/07/2023)   Hunger Vital Sign    Worried About Running Out of Food in the Last Year: Never true    Ran Out of Food in the Last Year: Never true  Transportation Needs: No Transportation Needs (09/07/2023)   PRAPARE - Administrator, Civil Service (Medical): No    Lack of Transportation (Non-Medical): No  Physical Activity: Insufficiently Active (08/09/2023)   Exercise Vital Sign    Days of Exercise per Week: 3 days    Minutes of Exercise per Session: 30 min  Stress: No Stress Concern Present (08/09/2023)   [employer] of Occupational Health - Occupational Stress Questionnaire    Feeling of Stress : Not at all  Social Connections: Socially Integrated (08/09/2023)   Social Connection and Isolation Panel    Frequency of Communication with Friends and Family: More than three times a week    Frequency of Social Gatherings with Friends and Family: Three times a week    Attends Religious Services: More than 4 times per year    Active Member of Clubs or Organizations: Yes    Attends Banker Meetings: More than 4 times per year    Marital Status: Married  Catering manager Violence: Not At Risk  (09/07/2023)   Humiliation, Afraid, Rape, and Kick questionnaire    Fear of Current or Ex-Partner: No    Emotionally Abused: No    Physically Abused: No    Sexually Abused: No    Outpatient Medications Prior to Visit  Medication Sig Dispense Refill   acetaminophen  (TYLENOL ) 500 MG tablet Take 500 mg by mouth 2 (two) times daily. Morning and bedtime     albuterol  (PROVENTIL ) (2.5 MG/3ML) 0.083% nebulizer solution Take 3 mLs (2.5 mg total) by nebulization every 6 (six) hours as needed for wheezing or shortness of breath. 150 mL 3   amiodarone  (PACERONE ) 200 MG tablet Take 1 tablet (200 mg total) by mouth daily. 90 tablet  3   apixaban  (ELIQUIS ) 2.5 MG TABS tablet Take 1 tablet (2.5 mg total) by mouth 2 (two) times daily. 90 tablet 3   cholecalciferol (VITAMIN D3) 25 MCG (1000 UNIT) tablet Take 2,000 Units by mouth daily.     fluticasone -salmeterol (ADVAIR) 500-50 MCG/ACT AEPB Inhale 1 puff into the lungs in the morning and at bedtime.     leflunomide  (ARAVA ) 20 MG tablet Take 1 tablet (20 mg total) by mouth daily.     metoprolol  succinate (TOPROL -XL) 50 MG 24 hr tablet Take 0.5 tablets (25 mg total) by mouth daily. Take with or immediately following a meal. 90 tablet 3   rosuvastatin  (CRESTOR ) 10 MG tablet Take 1 tablet (10 mg total) by mouth daily. 90 tablet 3   torsemide  (DEMADEX ) 20 MG tablet Take 2.5 tablets (50 mg total) by mouth daily. 75 tablet 2   gabapentin  (NEURONTIN ) 300 MG capsule Take 1 capsule by mouth at bedtime 90 capsule 0   MEDROL  4 MG TBPK tablet Take 4 mg by mouth See admin instructions.     montelukast  (SINGULAIR ) 10 MG tablet TAKE 1 TABLET BY MOUTH AT BEDTIME 90 tablet 0   tamsulosin  (FLOMAX ) 0.4 MG CAPS capsule Take 1 capsule by mouth once daily 90 capsule 0   No facility-administered medications prior to visit.    Allergies  Allergen Reactions   Ace Inhibitors Cough   Isosorb Dinitrate-Hydralazine Other (See Comments)    dizziness/hypotensive    ROS    See  HPI  Objective:    Physical Exam Constitutional:      General: He is not in acute distress.    Appearance: He is well-developed.  HENT:     Head: Normocephalic and atraumatic.  Cardiovascular:     Rate and Rhythm: Normal rate and regular rhythm.     Heart sounds: No murmur heard. Pulmonary:     Effort: Pulmonary effort is normal. No respiratory distress.     Breath sounds: Normal breath sounds. No wheezing or rales.  Musculoskeletal:     Right lower leg: 1+ Edema present.     Left lower leg: 1+ Edema present.  Skin:    General: Skin is warm and dry.  Neurological:     Mental Status: He is alert and oriented to person, place, and time.  Psychiatric:        Behavior: Behavior normal.        Thought Content: Thought content normal.      BP (!) 120/54 (BP Location: Right Arm, Patient Position: Sitting)   Pulse 60   Temp 99.2 F (37.3 C) (Oral)   Resp 16   Ht 5' 11 (1.803 m)   Wt 204 lb (92.5 kg)   SpO2 99%   BMI 28.45 kg/m  Wt Readings from Last 3 Encounters:  12/13/23 204 lb (92.5 kg)  12/06/23 199 lb 12.8 oz (90.6 kg)  10/06/23 185 lb (83.9 kg)       Assessment & Plan:   Problem List Items Addressed This Visit       Unprioritized   RESOLVED: Thrombus of left atrial appendage   Resolved on follow up echo, 2/25.        Rheumatoid arthritis (HCC)   Stable on Leflunomide , management per Rheumatology- Dr. Mai.       Pulmonary hypertension (HCC)   Likely secondary to OSA. Continue CPAP.       Persistent atrial fibrillation (HCC)   Stable on amiodarone /metoprolol /eliquis .  Followed by cardiology- Dr. Lavona, rate stable.  OSA (obstructive sleep apnea)   Reports good compliance recently with CPAP, has follow up sleep study in November.       Neuropathy   Relevant Medications   gabapentin  (NEURONTIN ) 300 MG capsule   Essential hypertension   BP Readings from Last 3 Encounters:  12/13/23 (!) 120/54  12/06/23 130/72  08/31/23 117/75    BP stable. Currently maintained on metoprolol  xl 50 mg.       Chronic kidney disease (CKD), stage IV (severe) (HCC)   Update renal function. Followed by Nephrology, Katheryn Saba MD at [facility].       Relevant Orders   Basic Metabolic Panel (BMET)   BPH (benign prostatic hyperplasia)   Voiding well on Tamsulosin .  Lab Results  Component Value Date   PSA 1.06 06/29/2022   PSA 1.7 11/23/2019   PSA 3.40 01/30/2019  Update PSA.         Relevant Medications   tamsulosin  (FLOMAX ) 0.4 MG CAPS capsule   Other Relevant Orders   PSA   B12 deficiency   Not currently on b12 supplement.      Relevant Orders   B12   Asthma - Primary   Stable on monteleukast/albuterol /advair. Continue same.       Relevant Medications   albuterol  (VENTOLIN  HFA) 108 (90 Base) MCG/ACT inhaler   montelukast  (SINGULAIR ) 10 MG tablet    I have discontinued Odai E. Pflieger's Medrol . I have also changed his tamsulosin , montelukast , and gabapentin . Additionally, I am having him start on albuterol . Lastly, I am having him maintain his acetaminophen , rosuvastatin , leflunomide , fluticasone -salmeterol, metoprolol  succinate, apixaban , cholecalciferol, amiodarone , torsemide , and albuterol .  Meds ordered this encounter  Medications   albuterol  (VENTOLIN  HFA) 108 (90 Base) MCG/ACT inhaler    Sig: Inhale 2 puffs into the lungs every 6 (six) hours as needed for wheezing or shortness of breath.    Dispense:  8 g    Refill:  5    Supervising Provider:   DOMENICA BLACKBIRD A [[id number]]   tamsulosin  (FLOMAX ) 0.4 MG CAPS capsule    Sig: Take 1 capsule (0.4 mg total) by mouth daily.    Dispense:  90 capsule    Refill:  1    Supervising Provider:   DOMENICA BLACKBIRD A [[id number]]   montelukast  (SINGULAIR ) 10 MG tablet    Sig: Take 1 tablet (10 mg total) by mouth at bedtime.    Dispense:  90 tablet    Refill:  1    Supervising Provider:   DOMENICA BLACKBIRD A [[id number]]   gabapentin  (NEURONTIN ) 300 MG capsule    Sig:  Take 1 capsule (300 mg total) by mouth at bedtime.    Dispense:  90 capsule    Refill:  5    Supervising Provider:   DOMENICA BLACKBIRD A [[id number]]

## 2023-12-13 NOTE — Assessment & Plan Note (Signed)
 BP Readings from Last 3 Encounters:  12/13/23 (!) 120/54  12/06/23 130/72  08/31/23 117/75   BP stable. Currently maintained on metoprolol  xl 50 mg.

## 2023-12-13 NOTE — Assessment & Plan Note (Addendum)
 Update renal function. Followed by Nephrology, Katheryn Saba MD at [facility].

## 2023-12-13 NOTE — Assessment & Plan Note (Signed)
 Reports good compliance recently with CPAP, has follow up sleep study in November.

## 2023-12-13 NOTE — Assessment & Plan Note (Signed)
 Likely secondary to OSA. Continue CPAP.

## 2023-12-13 NOTE — Assessment & Plan Note (Signed)
 Not currently on b12 supplement.

## 2023-12-13 NOTE — Assessment & Plan Note (Signed)
 Voiding well on Tamsulosin .  Lab Results  Component Value Date   PSA 1.06 06/29/2022   PSA 1.7 11/23/2019   PSA 3.40 01/30/2019  Update PSA.

## 2023-12-14 LAB — VITAMIN B12: Vitamin B-12: 307 pg/mL (ref 211–911)

## 2023-12-14 LAB — PSA: PSA: 1.66 ng/mL (ref 0.10–4.00)

## 2023-12-15 ENCOUNTER — Ambulatory Visit: Payer: Self-pay | Admitting: Family

## 2023-12-15 NOTE — Progress Notes (Signed)
 Called patient but no answer, left voice mail for patient to call back.

## 2023-12-15 NOTE — Telephone Encounter (Signed)
 B12 is low normal. Please add b12 1000 mcg PO daily.

## 2023-12-16 NOTE — Progress Notes (Signed)
 Patient notified of results.

## 2023-12-24 ENCOUNTER — Other Ambulatory Visit: Payer: Self-pay | Admitting: Family

## 2024-01-03 DIAGNOSIS — H11823 Conjunctivochalasis, bilateral: Secondary | ICD-10-CM | POA: Diagnosis not present

## 2024-01-03 DIAGNOSIS — H18413 Arcus senilis, bilateral: Secondary | ICD-10-CM | POA: Diagnosis not present

## 2024-01-03 DIAGNOSIS — H52203 Unspecified astigmatism, bilateral: Secondary | ICD-10-CM | POA: Diagnosis not present

## 2024-01-03 DIAGNOSIS — Z961 Presence of intraocular lens: Secondary | ICD-10-CM | POA: Diagnosis not present

## 2024-01-03 DIAGNOSIS — H02834 Dermatochalasis of left upper eyelid: Secondary | ICD-10-CM | POA: Diagnosis not present

## 2024-01-03 DIAGNOSIS — H43813 Vitreous degeneration, bilateral: Secondary | ICD-10-CM | POA: Diagnosis not present

## 2024-01-03 DIAGNOSIS — H02831 Dermatochalasis of right upper eyelid: Secondary | ICD-10-CM | POA: Diagnosis not present

## 2024-01-03 DIAGNOSIS — H524 Presbyopia: Secondary | ICD-10-CM | POA: Diagnosis not present

## 2024-01-05 ENCOUNTER — Encounter: Payer: Self-pay | Admitting: Cardiology

## 2024-01-05 ENCOUNTER — Telehealth: Payer: Self-pay | Admitting: Cardiology

## 2024-01-05 NOTE — Telephone Encounter (Signed)
 Pt states he sent medical forms through Mychart and also faxed them over. Pt requesting a update.

## 2024-01-05 NOTE — Telephone Encounter (Signed)
 Attempted to contact patient regarding paperwork he is requesting to be completed.  No answer.  Left message this office has received paperwork he needs completed via MyChart.  It will be sent to Dr Lavona to be completed and he will be notified at that time.  Requested he call back with any further questions or concerns.

## 2024-01-17 NOTE — Telephone Encounter (Signed)
 Paperwork printed and given to [employer] on 11/4

## 2024-01-18 DIAGNOSIS — Z0279 Encounter for issue of other medical certificate: Secondary | ICD-10-CM

## 2024-01-18 NOTE — Telephone Encounter (Signed)
 Received Discharge Application from [employer]. Patient signed the Release of Information and paid the $29 form fee.  Form is in Dr. Denver box.

## 2024-01-19 NOTE — Telephone Encounter (Signed)
 Paperwork given to Dr. Lavona 11/6

## 2024-01-19 NOTE — Telephone Encounter (Signed)
 Paperwork completed by Dr. Lavona and placed in Leanne's completed work mailbox 11/6.

## 2024-01-19 NOTE — Telephone Encounter (Signed)
 Completed Loan Discharge Application has been scanned to patient's chart.  I called patient to let him know that the form is ready for him to pick up.  Billing notified.

## 2024-01-31 ENCOUNTER — Ambulatory Visit: Admitting: Primary Care

## 2024-02-05 ENCOUNTER — Encounter (HOSPITAL_BASED_OUTPATIENT_CLINIC_OR_DEPARTMENT_OTHER): Admitting: Pulmonary Disease

## 2024-02-06 ENCOUNTER — Telehealth: Payer: Self-pay

## 2024-02-06 ENCOUNTER — Ambulatory Visit (INDEPENDENT_AMBULATORY_CARE_PROVIDER_SITE_OTHER): Admitting: Primary Care

## 2024-02-06 ENCOUNTER — Encounter: Payer: Self-pay | Admitting: Primary Care

## 2024-02-06 VITALS — BP 122/62 | HR 81 | Temp 97.4°F | Ht 71.0 in | Wt 212.8 lb

## 2024-02-06 DIAGNOSIS — J45909 Unspecified asthma, uncomplicated: Secondary | ICD-10-CM

## 2024-02-06 DIAGNOSIS — Z7901 Long term (current) use of anticoagulants: Secondary | ICD-10-CM | POA: Diagnosis not present

## 2024-02-06 DIAGNOSIS — G4733 Obstructive sleep apnea (adult) (pediatric): Secondary | ICD-10-CM | POA: Diagnosis not present

## 2024-02-06 DIAGNOSIS — I4891 Unspecified atrial fibrillation: Secondary | ICD-10-CM

## 2024-02-06 DIAGNOSIS — M069 Rheumatoid arthritis, unspecified: Secondary | ICD-10-CM

## 2024-02-06 MED ORDER — FLUTICASONE-SALMETEROL 500-50 MCG/ACT IN AEPB: 1.0000 | INHALATION_SPRAY | Freq: Two times a day (BID) | RESPIRATORY_TRACT | 11 refills | Status: AC

## 2024-02-06 NOTE — Progress Notes (Signed)
 @Patient  ID: Brandon [name], male    DOB: 1940/03/31, 83 y.o.   MRN: [id number]  No chief complaint on file.   Referring provider: Daryl Setter, NP  HPI: 83 year old male, former smoker. PMH significant for CHF, DVT, pulmonary HTN, asthma, OSA, rheumatoid arthritis, CKD, hyperlipidemia, obesity.   Sleep testing: HST 07/02/2021>> AHI 27.8 an hour with SPO2 low 84%   Previous [facility] pulmonary encounter:  12/06/2023 Discussed the use of AI scribe software for clinical note transcription with the patient, who gave verbal consent to proceed.  History of Present Illness Brandon [name] is an 83 year old male with obstructive sleep apnea who presents for follow-up after hospitalization and CPAP non-compliance. He was referred by his primary care physician, [name], for evaluation after recent hospitalizations and CPAP non-compliance.  He has a history of moderate obstructive sleep apnea, diagnosed in April 2023, with 27.8 apneic events per hour and oxygen  saturation dropping to 84%. He has not used his CPAP machine since his recent hospitalizations in January and February 2025. He possesses a CPAP machine and some supplies but has not used it since his recent hospitalizations.  In January 2025, he was hospitalized for atrial fibrillation and subsequently admitted to rehab. In February 2025, he was hospitalized again for a blood clot in his right upper extremity. He is currently on Eliquis  and amiodarone  for atrial fibrillation and torsemide  for congestive heart failure. No recent episodes of atrial fibrillation are reported.  He reports a significant weight loss of 30 to 40 pounds, primarily fluid, during his hospitalizations. He notes that he is not snoring anymore and does not wake up gasping or choking. His typical bedtime is between 11 PM and 12 AM, and he wakes up between 10 AM and 11 AM. He states that it does not take long for him to fall asleep.     02/06/2024- Interim hx   Discussed the use of AI scribe software for clinical note transcription with the patient, who gave verbal consent to proceed.  History of Present Illness Brandon [name] is an 83 year old male with sleep apnea and asthma who presents for follow-up of his conditions.  OSA moderate OSA diagnosed in April 2023 in 27.8 with O2 desaturation to 84% Noncompliant with CPAP, significant weight loss 22 lbs since last sleep study. Order split night testing to reassessment which was scheduled for 11/23. He canceled a previously scheduled sleep study due to discomfort with the conditions and concerns about COVID-19 and flu exposure. He has resumed using his CPAP machine and has the necessary supplies on hand. He reports using his CPAP regularly. No issues with the CPAP are reported.  He is on torsemide  for heart failure and reports no issues with fluid retention or shortness of breath. For atrial fibrillation, he is taking Eliquis  and amiodarone . He uses a rescue inhaler once a day, typically with moderate exertion or when climbing stairs. He is also on Advair, which he uses twice daily. He has not experienced any asthma flare-ups or respiratory infections requiring antibiotics or prednisone  in the last three months and has not been hospitalized for respiratory issues during this time.  He has received the flu shot this year. He also has rheumatoid arthritis and is under the care of Dr. Mai, an rheumatologist.      Allergies  Allergen Reactions   Ace Inhibitors Cough   Isosorb Dinitrate-Hydralazine Other (See Comments)    dizziness/hypotensive    Immunization History  Administered  Date(s) Administered   Fluad Quad(high Dose 65+) 11/21/2018, 12/15/2020, 12/14/2021, 12/06/2023   Fluad Trivalent(High Dose 65+) 12/20/2022   INFLUENZA, HIGH DOSE SEASONAL PF 02/26/2015, 12/09/2015, 04/22/2017, 12/14/2017   Influenza Split 02/24/2011, 12/03/2011   Influenza Whole 03/16/2007, 12/29/2009    Influenza,inj,Quad PF,6+ Mos 02/13/2013, 11/14/2013   Moderna Covid-19 Vaccine Bivalent Booster 34yrs & up 12/08/2020   Moderna SARS-COV2 Booster Vaccination 12/15/2020   Moderna Sars-Covid-2 Vaccination 04/08/2019, 05/06/2019, 11/27/2019, 07/02/2020   Pfizer(Comirnaty)Fall Seasonal Vaccine 12 years and older 12/13/2021   Pneumococcal Conjugate-13 11/14/2013   Pneumococcal Polysaccharide-23 08/13/2009   Td 07/26/2006   Tdap 09/22/2012, 05/23/2017   Zoster Recombinant(Shingrix ) 04/15/2021   Zoster, Live 05/17/2011    Past Medical History:  Diagnosis Date   Anemia    Arm DVT (deep venous thromboembolism), acute, right (HCC)    Asthma    Hx of childhood asthma, disappeared for a while, then resurfaced 6-7 years ago.    Cardiogenic shock (HCC) 04/16/2023   Cardiomyopathy    with a negative cardiac catheterization in the past. 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 started when 18, 1 ppd   Counseling given: Not Answered Tobacco comments: quit smoling in 1990. started when 18, 1 ppd   Outpatient Medications Prior to Visit  Medication Sig Dispense Refill   acetaminophen  (TYLENOL ) 500 MG tablet Take 500 mg by mouth 2 (two) times daily. Morning and bedtime     albuterol  (PROVENTIL ) (2.5 MG/3ML) 0.083% nebulizer solution Take 3 mLs (2.5 mg total) by nebulization every 6 (six) hours as needed for wheezing or shortness of breath. 150 mL 3   albuterol  (VENTOLIN  HFA) 108 (90 Base) MCG/ACT inhaler Inhale 2 puffs into the lungs every 6 (six) hours as needed for wheezing or shortness of breath. 8 g 5    amiodarone  (PACERONE ) 200 MG tablet Take 1 tablet (200 mg total) by mouth daily. 90 tablet 3   apixaban  (ELIQUIS ) 2.5 MG TABS tablet Take 1 tablet (2.5 mg total) by mouth 2 (two) times daily. 90 tablet 3   cholecalciferol (VITAMIN D3) 25 MCG (1000 UNIT) tablet Take 2,000 Units by mouth daily.     fluticasone -salmeterol (ADVAIR) 500-50 MCG/ACT AEPB Inhale 1 puff into the lungs in the morning and at bedtime.     gabapentin  (NEURONTIN ) 300 MG capsule Take 1 capsule (300 mg total) by mouth at bedtime. 90 capsule 5   leflunomide  (ARAVA ) 20 MG tablet Take 1 tablet (20 mg total) by mouth daily.     metoprolol  succinate (TOPROL -XL) 50 MG 24 hr tablet Take 0.5 tablets (25 mg total) by mouth daily. Take with or immediately following a meal. 90 tablet 3   montelukast  (SINGULAIR ) 10 MG tablet Take 1 tablet (10 mg total) by mouth at bedtime. 90 tablet 1   rosuvastatin  (CRESTOR ) 10 MG tablet Take 1 tablet (10 mg total) by mouth daily. 90 tablet 3   tamsulosin  (FLOMAX ) 0.4 MG CAPS capsule Take 1 capsule (0.4 mg total) by mouth daily. 90 capsule 1   torsemide  (DEMADEX ) 20 MG  tablet TAKE 2 & 1/2 (TWO & ONE-HALF) TABLETS BY MOUTH ONCE DAILY 75 tablet 0   No facility-administered medications prior to visit.   Review of Systems  Review of Systems  Constitutional: Negative.   Respiratory: Negative.     Physical Exam  There were no vitals taken for this visit. Physical Exam Constitutional:      Appearance: Normal appearance. He is well-developed.  HENT:     Head: Normocephalic and atraumatic.     Mouth/Throat:     Mouth: Mucous membranes are moist.     Pharynx: Oropharynx is clear.  Cardiovascular:     Rate and Rhythm: Normal rate and regular rhythm.     Heart sounds: Normal heart sounds.  Pulmonary:     Effort: Pulmonary effort is normal. No respiratory distress.     Breath sounds: Normal breath sounds. No wheezing or rhonchi.  Musculoskeletal:        General: Normal range of motion.      Cervical back: Normal range of motion and neck supple.  Skin:    General: Skin is warm and dry.     Findings: No erythema or rash.  Neurological:     General: No focal deficit present.     Mental Status: He is alert and oriented to person, place, and time. Mental status is at baseline.  Psychiatric:        Mood and Affect: Mood normal.        Behavior: Behavior normal.        Thought Content: Thought content normal.        Judgment: Judgment normal.      Lab Results:  CBC    Component Value Date/Time   WBC 8.7 05/04/2023 0520   RBC 3.97 (L) 05/04/2023 0520   HGB 12.5 (L) 05/04/2023 0520   HGB 12.7 (L) 04/24/2019 1343   HCT 37.5 (L) 05/04/2023 0520   PLT 198 05/04/2023 0520   PLT 285 04/24/2019 1343   MCV 94.5 05/04/2023 0520   MCH 31.5 05/04/2023 0520   MCHC 33.3 05/04/2023 0520   RDW 15.1 05/04/2023 0520   LYMPHSABS 0.5 (L) 05/03/2023 2146   MONOABS 0.7 05/03/2023 2146   EOSABS 0.0 05/03/2023 2146   BASOSABS 0.0 05/03/2023 2146    BMET    Component Value Date/Time   NA 139 08/16/2023 1632   K 4.7 08/16/2023 1632   CL 100 08/16/2023 1632   CO2 18 (L) 08/16/2023 1632   GLUCOSE 87 08/16/2023 1632   GLUCOSE 84 06/13/2023 1327   BUN 40 (H) 08/16/2023 1632   CREATININE 2.43 (H) 08/16/2023 1632   CREATININE 1.55 (H) 04/24/2019 1343   CREATININE 1.40 (H) 04/12/2019 1317   CALCIUM  9.5 08/16/2023 1632   GFRNONAA 22 (L) 05/04/2023 0520   GFRNONAA 42 (L) 04/24/2019 1343   GFRNONAA 48 (L) 04/12/2019 1317   GFRAA 44 (L) 11/02/2019 1501   GFRAA 49 (L) 04/24/2019 1343   GFRAA 55 (L) 04/12/2019 1317    BNP    Component Value Date/Time   BNP 2,612.3 (H) 04/15/2023 1627    ProBNP    Component Value Date/Time   PROBNP 1,464.0 (H) 03/22/2022 1002    Imaging: No results found.   Assessment & Plan:    1. OSA (obstructive sleep apnea) (Primary)  2. Asthma, unspecified asthma severity, unspecified whether complicated, unspecified whether  persistent   Assessment and Plan Assessment & Plan Obstructive sleep apnea Obstructive sleep apnea is well controlled with CPAP therapy. Improved compliance  at 80% over the last 30 days, meeting the threshold of 70%. Average usage is 5 hours and 8 minutes per night. Pressure settings are on auto mode with a range of 5 to 15 cm H2O. Apnea score is 2.8, indicating effective treatment. No issues reported with CPAP usage. - Continue CPAP therapy with current settings. - Ensure regular replacement of CPAP supplies: mask cushion monthly, tubing every 3 months, filter monthly, headgear and water chamber twice a year. - Use distilled water in the CPAP machine. - Renew CPAP supplies   Asthma Asthma is well controlled with Advair and rescue inhaler. Rescue inhaler is used once daily, primarily with moderate exertion or inclines. No recent flare-ups, hospitalizations, or need for antibiotics or prednisone  in the last three months. - Continue Advair twice daily. - Use rescue inhaler as needed, particularly with moderate exertion or inclines. - Refills provided   Atrial fibrillation, stable  Atrial fibrillation is managed with Eliquis  and amiodarone . No recent exacerbations or complications reported. - Continue Eliquis  and amiodarone  as prescribed.  Heart failure, stable  Heart failure is stable on torsemide . No issues with fluid retention or shortness of breath reported. - Continue torsemide  as prescribed.  Rheumatoid arthritis, stable  Rheumatoid arthritis is stable. Following with Dr. Mai with rheumatology.  - Continue Arava  as prescribed    Almarie LELON Ferrari, NP 02/06/2024

## 2024-02-06 NOTE — Patient Instructions (Signed)
  VISIT SUMMARY: You had a follow-up appointment to review your sleep apnea, asthma, atrial fibrillation, heart failure, and rheumatoid arthritis. You reported no major issues with your current treatments and have been using your CPAP machine regularly. Your conditions are well controlled with your current medications and treatments.  YOUR PLAN: -OBSTRUCTIVE SLEEP APNEA: Obstructive sleep apnea is a condition where your breathing stops and starts during sleep. Your condition is well controlled with CPAP therapy, and you should continue using your CPAP machine with the current settings. Make sure to replace the CPAP supplies regularly: mask cushion monthly, tubing every 3 months, filter monthly, and headgear and water chamber twice a year. Use distilled water in the CPAP machine.  -ASTHMA: Asthma is a condition where your airways narrow and swell, making it hard to breathe. Your asthma is well controlled with Advair and a rescue inhaler. Continue using Advair twice daily and use the rescue inhaler as needed, especially with moderate exertion or inclines.  -ATRIAL FIBRILLATION: Atrial fibrillation is an irregular and often rapid heart rate that can increase your risk of strokes and heart disease. Your condition is managed with Eliquis  and amiodarone . Continue taking these medications as prescribed.  -HEART FAILURE: Heart failure is a condition where your heart doesn't pump blood as well as it should. Your condition is stable with torsemide . Continue taking torsemide  as prescribed.  -RHEUMATOID ARTHRITIS: Rheumatoid arthritis is an autoimmune disorder that causes joint inflammation and pain. Your condition is stable.  INSTRUCTIONS: Please continue with your current medications and treatments as discussed. Ensure regular replacement of your CPAP supplies and use distilled water in the machine. Follow up with your specialists as needed.

## 2024-02-06 NOTE — Addendum Note (Signed)
 Addended by: HOPE ALMARIE ORN on: 02/06/2024 11:35 AM   Modules accepted: Orders

## 2024-02-06 NOTE — Telephone Encounter (Signed)
 Copied from CRM #[id number]. Topic: Clinical - Order For Equipment >> Feb 06, 2024  1:05 PM Brandon [name] wrote: Reason for CRM: patient attempted to pick up equipment for CPAP machine at [facility] but they are asking for [name] new RX to be sent in order for him to be able to pick it up - fax# [phone] Patient is asking for [name] call back once rx is sent   Pt has ov 11/24 and this order was placed.

## 2024-02-12 ENCOUNTER — Other Ambulatory Visit: Payer: Self-pay | Admitting: Family

## 2024-02-20 LAB — CBC AND DIFFERENTIAL
HCT: 33 — AB (ref 41–53)
Hemoglobin: 10.6 — AB (ref 13.5–17.5)
Platelets: 165 K/uL (ref 150–400)
WBC: 7.2

## 2024-02-20 LAB — CBC: RBC: 3.31 — AB (ref 3.87–5.11)

## 2024-02-21 LAB — LAB REPORT - SCANNED: EGFR: 24

## 2024-02-23 ENCOUNTER — Other Ambulatory Visit: Payer: Self-pay

## 2024-02-23 ENCOUNTER — Telehealth: Payer: Self-pay

## 2024-02-23 DIAGNOSIS — G4733 Obstructive sleep apnea (adult) (pediatric): Secondary | ICD-10-CM

## 2024-02-23 NOTE — Telephone Encounter (Signed)
 Copied from CRM [id number]. Topic: Clinical - Order For Equipment >> Feb 20, 2024  2:36 PM Rilla B wrote: Reason for CRM: Patient states he has been waiting for the office to send a new script over to [facility] so he can get his CPAP equipment. I do see CRM from 11/24. Please call patient to follow up @ [phone].   Spoke with patient VBU. Sent new order to synapse

## 2024-03-10 ENCOUNTER — Emergency Department (HOSPITAL_COMMUNITY)

## 2024-03-10 ENCOUNTER — Other Ambulatory Visit: Payer: Self-pay

## 2024-03-10 ENCOUNTER — Emergency Department (HOSPITAL_COMMUNITY)
Admission: EM | Admit: 2024-03-10 | Discharge: 2024-03-10 | Disposition: A | Discharge status: Home / Self Care | Attending: Emergency Medicine | Admitting: Emergency Medicine

## 2024-03-10 ENCOUNTER — Encounter (HOSPITAL_COMMUNITY): Payer: Self-pay | Admitting: Emergency Medicine

## 2024-03-10 DIAGNOSIS — R600 Localized edema: Secondary | ICD-10-CM | POA: Diagnosis not present

## 2024-03-10 DIAGNOSIS — Z7951 Long term (current) use of inhaled steroids: Secondary | ICD-10-CM | POA: Insufficient documentation

## 2024-03-10 DIAGNOSIS — J101 Influenza due to other identified influenza virus with other respiratory manifestations: Secondary | ICD-10-CM | POA: Diagnosis not present

## 2024-03-10 DIAGNOSIS — R059 Cough, unspecified: Secondary | ICD-10-CM | POA: Diagnosis present

## 2024-03-10 DIAGNOSIS — I482 Chronic atrial fibrillation, unspecified: Secondary | ICD-10-CM | POA: Insufficient documentation

## 2024-03-10 DIAGNOSIS — R7989 Other specified abnormal findings of blood chemistry: Secondary | ICD-10-CM | POA: Insufficient documentation

## 2024-03-10 DIAGNOSIS — R051 Acute cough: Secondary | ICD-10-CM

## 2024-03-10 DIAGNOSIS — R0602 Shortness of breath: Secondary | ICD-10-CM | POA: Diagnosis not present

## 2024-03-10 DIAGNOSIS — I509 Heart failure, unspecified: Secondary | ICD-10-CM | POA: Insufficient documentation

## 2024-03-10 DIAGNOSIS — J4489 Other specified chronic obstructive pulmonary disease: Secondary | ICD-10-CM | POA: Insufficient documentation

## 2024-03-10 DIAGNOSIS — Z7901 Long term (current) use of anticoagulants: Secondary | ICD-10-CM | POA: Diagnosis not present

## 2024-03-10 LAB — TROPONIN T, HIGH SENSITIVITY
Troponin T High Sensitivity: 45 ng/L — ABNORMAL HIGH (ref 0–19)
Troponin T High Sensitivity: 42 ng/L — ABNORMAL HIGH (ref 0–19)

## 2024-03-10 LAB — PRO BRAIN NATRIURETIC PEPTIDE: Pro Brain Natriuretic Peptide: 5998 pg/mL — ABNORMAL HIGH

## 2024-03-10 LAB — CBC WITH DIFFERENTIAL/PLATELET
Abs Immature Granulocytes: 0.02 K/uL (ref 0.00–0.07)
Basophils Absolute: 0.1 K/uL (ref 0.0–0.1)
Basophils Relative: 1 %
Eosinophils Absolute: 0.2 K/uL (ref 0.0–0.5)
Eosinophils Relative: 3 %
HCT: 35.3 % — ABNORMAL LOW (ref 39.0–52.0)
Hemoglobin: 10.9 g/dL — ABNORMAL LOW (ref 13.0–17.0)
Immature Granulocytes: 0 %
Lymphocytes Relative: 7 %
Lymphs Abs: 0.4 K/uL — ABNORMAL LOW (ref 0.7–4.0)
MCH: 32.2 pg (ref 26.0–34.0)
MCHC: 30.9 g/dL (ref 30.0–36.0)
MCV: 104.4 fL — ABNORMAL HIGH (ref 80.0–100.0)
Monocytes Absolute: 0.7 K/uL (ref 0.1–1.0)
Monocytes Relative: 12 %
Neutro Abs: 4.6 K/uL (ref 1.7–7.7)
Neutrophils Relative %: 77 %
Platelets: 144 K/uL — ABNORMAL LOW (ref 150–400)
RBC: 3.38 MIL/uL — ABNORMAL LOW (ref 4.22–5.81)
RDW: 13.6 % (ref 11.5–15.5)
WBC: 6 K/uL (ref 4.0–10.5)
nRBC: 0 % (ref 0.0–0.2)

## 2024-03-10 LAB — COMPREHENSIVE METABOLIC PANEL WITH GFR
ALT: 11 U/L (ref 0–44)
AST: 20 U/L (ref 15–41)
Albumin: 3.8 g/dL (ref 3.5–5.0)
Alkaline Phosphatase: 63 U/L (ref 38–126)
Anion gap: 9 (ref 5–15)
BUN: 32 mg/dL — ABNORMAL HIGH (ref 8–23)
CO2: 25 mmol/L (ref 22–32)
Calcium: 9.4 mg/dL (ref 8.9–10.3)
Chloride: 105 mmol/L (ref 98–111)
Creatinine, Ser: 2.4 mg/dL — ABNORMAL HIGH (ref 0.61–1.24)
GFR, Estimated: 26 mL/min — ABNORMAL LOW
Glucose, Bld: 91 mg/dL (ref 70–99)
Potassium: 4.9 mmol/L (ref 3.5–5.1)
Sodium: 139 mmol/L (ref 135–145)
Total Bilirubin: 0.5 mg/dL (ref 0.0–1.2)
Total Protein: 6.6 g/dL (ref 6.5–8.1)

## 2024-03-10 LAB — I-STAT CG4 LACTIC ACID, ED: Lactic Acid, Venous: 0.7 mmol/L (ref 0.5–1.9)

## 2024-03-10 LAB — RESP PANEL BY RT-PCR (RSV, FLU A&B, COVID)  RVPGX2
Influenza A by PCR: POSITIVE — AB
Influenza B by PCR: NEGATIVE
Resp Syncytial Virus by PCR: NEGATIVE
SARS Coronavirus 2 by RT PCR: NEGATIVE

## 2024-03-10 MED ORDER — IPRATROPIUM-ALBUTEROL 0.5-2.5 (3) MG/3ML IN SOLN
3.0000 mL | Freq: Once | RESPIRATORY_TRACT | Status: AC
  Administered 2024-03-10: 3 mL via RESPIRATORY_TRACT
  Filled 2024-03-10: qty 3

## 2024-03-10 MED ORDER — PREDNISONE 10 MG PO TABS: 40.0000 mg | ORAL_TABLET | Freq: Every day | ORAL | 0 refills | Status: AC

## 2024-03-10 MED ORDER — ACETAMINOPHEN 325 MG PO TABS
650.0000 mg | ORAL_TABLET | Freq: Once | ORAL | Status: AC
  Administered 2024-03-10: 650 mg via ORAL
  Filled 2024-03-10: qty 2

## 2024-03-10 MED ORDER — METHYLPREDNISOLONE SODIUM SUCC 125 MG IJ SOLR
125.0000 mg | Freq: Once | INTRAMUSCULAR | Status: AC
  Administered 2024-03-10: 125 mg via INTRAVENOUS
  Filled 2024-03-10: qty 2

## 2024-03-10 MED ORDER — OSELTAMIVIR PHOSPHATE 75 MG PO CAPS: 75.0000 mg | ORAL_CAPSULE | Freq: Two times a day (BID) | ORAL | 0 refills | Status: DC

## 2024-03-10 NOTE — ED Triage Notes (Signed)
 Pt [facility] from home for cough,  gen weakness x 2 days. Temp 100.  128/70 HR 74, RR 20 94% RA, 2L for comfort.CBG 118.  Preq PVC (bigeminy)

## 2024-03-10 NOTE — Discharge Instructions (Addendum)
 You were seen today for shortness of breath. While you were here we monitored your vitals, preformed a physical exam, and labs and imaging. These were all reassuring and there is no indication for any further testing or intervention in the emergency department at this time.   Things to do:  - Follow up with your primary care provider within the next 1-2 weeks - You have influenza A.  Please take your Tamiflu  twice daily for 5 days. - We have also written you 4 more days of steroids for an asthma exacerbation - Please continue to take your daily heart failure medications at home  Return to the emergency department if you have any new or worsening symptoms including chest pain, shortness of breath, inability to tolerate p.o., fevers not amenable to Tylenol  or Motrin, chest pain or shortness of breath on exertion, or if you have any other concerns.

## 2024-03-10 NOTE — ED Provider Notes (Signed)
 Assume Care - Medical Decision Making  Care of patient assumed from previous emergency medicine provider at 1500. See their note for further details of history, physical exam and plan.  Briefly, Brandon [name] is a 83 y.o. male who presents with: Shortness of breath, cough, and generalized weakness.  Clinical Course as of 03/10/24 1802  Sat Mar 10, 2024  1433 Patient does note that he is on furosemide  for his CHF and has been compliant with this. [CR]  1527 -CHF/CAD -SOB, Flu A+ -delta trop --> d/c with tamiflu  [SE]    Clinical Course User Index [CR] Daralene Lonni BIRCH, PA-C [SE] Guillermina Hamilton, MD     Reassessment: I personally reassessed the patient:  Vital Signs:  The most current vitals were  Vitals:   03/10/24 1219 03/10/24 1430  BP: 115/76 128/81  Pulse: 63 79  Resp: 18 17  Temp: (!) 101.5 F (38.6 C)   SpO2: 95% 99%   Hemodynamics:  The patient is hemodynamically stable. Mental Status:  The patient is alert  Additional MDM/ED Course: Patient hemodynamic stable at this time of handoff.  Flu a positive with stable delta troponins.  Otherwise largely unremarkable lab workup.  Creatinine stable no significant electrolyte abnormalities and stable chronic anemia with microcytosis.  Mild thrombocytopenia.  Chest x-ray with no pneumonia.  So feel antibiotics are not necessary.  Given unremarkable workup aside from flu a positive feel patient in for discharge.  Will give Tamiflu  given history of asthma.  Will also give several days of steroids given patient responded to steroids and DuoNebs in the ED and likely has a component of asthma exacerbation on top of this.  Patient discharged home in hemodynamically stable condition with strict turn precautions.  We discussed and he plans to follow-up with his PCP and cardiologist in the outpatient setting.  Disposition:  I discussed the plan for discharge with the patient and/or their surrogate at bedside prior to discharge and  they were in agreement with the plan and verbalized understanding of the return precautions provided. All questions answered to the best of my ability. Ultimately, the patient was discharged in stable condition with stable vital signs. I am reassured that they are capable of close follow up and good social support at home.   Clinical Impression:  1. Influenza A   2. Shortness of breath   3. Acute cough     Rx / DC Orders ED Discharge Orders          Ordered    oseltamivir  (TAMIFLU ) 75 MG capsule  Every 12 hours        03/10/24 1627    predniSONE  (DELTASONE ) 10 MG tablet  Daily        03/10/24 1750            The plan for this patient was discussed with Dr. Patt, who voiced agreement and who oversaw evaluation and treatment of this patient.   Electronically signed by:   Hamilton Carlin Guillermina, M.D. PGY-2, Emergency Medicine      Guillermina Hamilton, MD 03/10/24 ZEB    Patt Alm Macho, MD 03/10/24 2159

## 2024-03-10 NOTE — ED Provider Notes (Signed)
 " [facility] Provider Note   CSN: [id number] Arrival date & time: 03/10/24  1202     Patient presents with: No chief complaint on file.   Brandon [name] is a 83 y.o. male.   Patient is an 83 year old male with a past medical history of atrial fibrillation and CHF who presents to the emergency department with a chief complaint of shortness of breath, cough, increased generalized weakness over the past 2 days.  Patient was noted to be febrile in the emergency department but notes that he has not noted any fevers at home.  Patient notes that he was around a lot of his family members over the holidays unsure if he was exposed to something with them.  He notes that he has had no associated dizziness, lightheadedness or syncope.  He denies any active chest pain at this time.  He has had no abdominal pain, nausea, vomiting, diarrhea.  He denies any increased edema to lower extremities.        Prior to Admission medications  Medication Sig Start Date End Date Taking? Authorizing Provider  acetaminophen  (TYLENOL ) 500 MG tablet Take 500 mg by mouth 2 (two) times daily. Morning and bedtime    [provider]  albuterol  (PROVENTIL ) (2.5 MG/3ML) 0.083% nebulizer solution Take 3 mLs (2.5 mg total) by nebulization every 6 (six) hours as needed for wheezing or shortness of breath. 11/29/23   O'Sullivan, [name], NP  albuterol  (VENTOLIN  HFA) 108 (90 Base) MCG/ACT inhaler Inhale 2 puffs into the lungs every 6 (six) hours as needed for wheezing or shortness of breath. 12/13/23   O'Sullivan, [name], NP  amiodarone  (PACERONE ) 200 MG tablet Take 1 tablet (200 mg total) by mouth daily. 09/14/23   Lavona Agent, MD  apixaban  (ELIQUIS ) 2.5 MG TABS tablet Take 1 tablet (2.5 mg total) by mouth 2 (two) times daily. 08/16/23   Lavona Agent, MD  cholecalciferol (VITAMIN D3) 25 MCG (1000 UNIT) tablet Take 2,000 Units by mouth daily.    [provider]   fluticasone -salmeterol (ADVAIR) 500-50 MCG/ACT AEPB Inhale 1 puff into the lungs in the morning and at bedtime. 02/06/24   Hope Almarie ORN, NP  gabapentin  (NEURONTIN ) 300 MG capsule Take 1 capsule (300 mg total) by mouth at bedtime. 12/13/23   O'Sullivan, [name], NP  leflunomide  (ARAVA ) 20 MG tablet Take 1 tablet (20 mg total) by mouth daily. 06/09/23   O'Sullivan, [name], NP  metoprolol  succinate (TOPROL -XL) 50 MG 24 hr tablet Take 0.5 tablets (25 mg total) by mouth daily. Take with or immediately following a meal. 08/16/23   Lavona Agent, MD  montelukast  (SINGULAIR ) 10 MG tablet Take 1 tablet (10 mg total) by mouth at bedtime. 12/13/23   O'Sullivan, [name], NP  rosuvastatin  (CRESTOR ) 10 MG tablet Take 1 tablet (10 mg total) by mouth daily. 06/09/23   O'Sullivan, [name], NP  tamsulosin  (FLOMAX ) 0.4 MG CAPS capsule Take 1 capsule (0.4 mg total) by mouth daily. 12/13/23   O'Sullivan, [name], NP  torsemide  (DEMADEX ) 20 MG tablet Take 2.5 tablets (50 mg total) by mouth daily. 02/13/24   O'Sullivan, [name], NP    Allergies: Ace inhibitors and Isosorb dinitrate-hydralazine    Review of Systems  Constitutional:  Positive for fatigue and fever.  Respiratory:  Positive for cough and shortness of breath.   All other systems reviewed and are negative.   Updated Vital Signs BP 115/76   Pulse 63   Temp (!) 101.5 F (38.6 C) Comment:  RN aware of temp  Resp 18   SpO2 95%   Physical Exam Vitals and nursing note reviewed.  Constitutional:      General: He is not in acute distress.    Appearance: Normal appearance. He is not ill-appearing.  HENT:     Head: Normocephalic and atraumatic.     Nose: Nose normal.     Mouth/Throat:     Mouth: Mucous membranes are moist.  Eyes:     Extraocular Movements: Extraocular movements intact.     Conjunctiva/sclera: Conjunctivae normal.     Pupils: Pupils are equal, round, and reactive to light.  Cardiovascular:     Rate and Rhythm: Normal rate and  regular rhythm.     Pulses: Normal pulses.     Heart sounds: Normal heart sounds. No murmur heard.    No gallop.  Pulmonary:     Effort: Pulmonary effort is normal. No respiratory distress.     Breath sounds: No stridor. Wheezing present. No rhonchi or rales.  Abdominal:     General: Abdomen is flat. Bowel sounds are normal. There is no distension.     Palpations: Abdomen is soft.     Tenderness: There is no abdominal tenderness. There is no guarding.  Musculoskeletal:        General: No swelling, tenderness, deformity or signs of injury. Normal range of motion.     Cervical back: Normal range of motion and neck supple. No rigidity or tenderness.     Right lower leg: Edema present.     Left lower leg: Edema present.  Skin:    General: Skin is warm and dry.     Findings: No bruising or rash.  Neurological:     General: No focal deficit present.     Mental Status: He is alert and oriented to person, place, and time. Mental status is at baseline.     Cranial Nerves: No cranial nerve deficit.     Sensory: No sensory deficit.     Motor: No weakness.     Coordination: Coordination normal.     Gait: Gait normal.  Psychiatric:        Mood and Affect: Mood normal.        Behavior: Behavior normal.        Thought Content: Thought content normal.        Judgment: Judgment normal.     (all labs ordered are listed, but only abnormal results are displayed) Labs Reviewed  COMPREHENSIVE METABOLIC PANEL WITH GFR - Abnormal; Notable for the following components:      Result Value   BUN 32 (*)    Creatinine, Ser 2.40 (*)    GFR, Estimated 26 (*)    All other components within normal limits  PRO BRAIN NATRIURETIC PEPTIDE - Abnormal; Notable for the following components:   Pro Brain Natriuretic Peptide 5,998.0 (*)    All other components within normal limits  CBC WITH DIFFERENTIAL/PLATELET - Abnormal; Notable for the following components:   RBC 3.38 (*)    Hemoglobin 10.9 (*)    HCT 35.3  (*)    MCV 104.4 (*)    Platelets 144 (*)    Lymphs Abs 0.4 (*)    All other components within normal limits  TROPONIN T, HIGH SENSITIVITY - Abnormal; Notable for the following components:   Troponin T High Sensitivity 45 (*)    All other components within normal limits  RESP PANEL BY RT-PCR (RSV, FLU A&B, COVID)  RVPGX2  URINALYSIS,  ROUTINE W REFLEX MICROSCOPIC  I-STAT CG4 LACTIC ACID, ED    EKG: None  Radiology: No results found.   Procedures   Medications Ordered in the ED  methylPREDNISolone  sodium succinate (SOLU-MEDROL ) 125 mg/2 mL injection 125 mg (125 mg Intravenous Given 03/10/24 1413)  ipratropium-albuterol  (DUONEB) 0.5-2.5 (3) MG/3ML nebulizer solution 3 mL (3 mLs Nebulization Given 03/10/24 1413)    Clinical Course as of 03/10/24 1457  Sat Mar 10, 2024  1433 Patient does note that he is on furosemide  for his CHF and has been compliant with this. [CR]    Clinical Course User Index [CR] Daralene Lonni BIRCH, PA-C                                 Medical Decision Making Amount and/or Complexity of Data Reviewed Labs: ordered. Radiology: ordered.  Risk OTC drugs. Prescription drug management.   This patient presents to the ED for concern of cough, fatigue, shortness of breath differential diagnosis includes influenza, COVID-19, other acute viral syndrome, pneumonia, sepsis, CHF, asthma exacerbation, COPD    Additional history obtained:  Additional history obtained from medical records External records from outside source obtained and reviewed including records   Lab Tests:  I Ordered, and personally interpreted labs.  The pertinent results include: No leukocytosis, anemia at baseline, creatinine at baseline, unremarkable electrolytes, normal liver function, negative lactic acid, elevated troponin, serial troponin pending, elevated BNP, positive for influenza   Imaging Studies ordered:  I ordered imaging studies including chest x-ray I  independently visualized and interpreted imaging which showed no acute cardiopulmonary process I agree with the radiologist interpretation   Medicines ordered and prescription drug management:  I ordered medication including Solu-Medrol , DuoNeb, Tylenol  for fever, wheezing Reevaluation of the patient after these medicines showed that the patient improved I have reviewed the patients home medicines and have made adjustments as needed   Problem List / ED Course:  Patient does remain stable at this time.  He has positive for influenza A and do suspect that this is the cause of his symptoms.  He has no associated hypoxia at this point and has been given breathing treatment and steroids in the emergency department for his wheezing.  Low suspicion for acute CHF at this point.  Will obtain a serial troponin before final dispo.  Will sign patient out to Dr. Guillermina and Dr. Patt at shift change.   Social Determinants of Health:  None        Final diagnoses:  None    ED Discharge Orders     None          Daralene Lonni BIRCH DEVONNA 03/10/24 1459    Tegeler, Lonni PARAS, MD 03/10/24 1607  "

## 2024-03-12 ENCOUNTER — Telehealth: Payer: Self-pay

## 2024-03-12 ENCOUNTER — Telehealth: Payer: Self-pay | Admitting: Cardiology

## 2024-03-12 ENCOUNTER — Telehealth: Payer: Self-pay | Admitting: Family

## 2024-03-12 NOTE — Telephone Encounter (Signed)
 Copied from CRM #[id number]. Topic: Clinical - Medication Question >> Mar 12, 2024 10:48 AM Dedra B wrote: Reason for CRM: Pt was seen in the ER 03/10/24 for the flu and prescribed prednisone  and tamiflu . He started the prednisone . The pharmacy was out of tamiflu . He wants to know if it's okay for him to take the tamiflu  when it becomes available in addition to his regular medications.

## 2024-03-12 NOTE — Telephone Encounter (Signed)
 Called pt to relay the message. Pt thankful for the call.

## 2024-03-12 NOTE — Telephone Encounter (Signed)
"  Yes, ok to take  "

## 2024-03-12 NOTE — Telephone Encounter (Signed)
 Patient would like to know if Tamiflu  is safe for him to take with his current mediations. Please advise.

## 2024-03-12 NOTE — Telephone Encounter (Signed)
 Patient reports he is feeling better on prednisone  and other medications. He was advised Tamiflu  is to be started on the first 3 days. He will continue to take his other medications

## 2024-03-12 NOTE — Telephone Encounter (Signed)
 Copied from CRM [id number]. Topic: Appointments - Scheduling Inquiry for Clinic >> Mar 12, 2024 10:46 AM Dedra B wrote: Reason for CRM: Pt called to schedule hospital f/u with Eleanor Ponto. Her next available is 04/09/24, which is outside of the 14 day window. Pt prefers to see his PCP.  Can we put him in a same day slot?

## 2024-03-16 NOTE — Progress Notes (Signed)
 " Cardiology Office Note:   Date:  03/19/2024  ID:  Brandon [name], DOB 27-Dec-1940, MRN [id number] PCP: Daryl Setter, NP  [facility] Providers Cardiologist:  Lynwood Schilling, MD {  History of Present Illness:   Brandon [name] is a 84 y.o. male who presents for follow up of cardiomyopathy.   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  He was seen in the office in  February 2025 after discharg from the hospital for treatment of cardiogenic shock with recurrent atrial fibrillation.  He diuresed and had cardioversion.  He required milrinone .  He had progressive cardiorenal syndrome and palliative care was consulted.  He was thought to be appropriate for outpatient hospice.   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  He was treated with a higher dose Eliquis .  He was discharged to a nursing home.  He actually went through rehab and then went to his daughter's house.  He went back to his house with his wife eventually and was able to walk with a can.  I saw him in the office in June 2025.  He was in the ED with Flu A in late Dec.     I reviewed the ER records.  He had a BNP level of 5798.  However, chest x-ray did not suggest any edema.  He was having cough and wheezing.  He actually responded well to steroids and a steroid taper.  He did not require any diuretics.  He did have a minimally elevated troponin but it was flat at 45 and 42.  He was not describing any palpitations, presyncope or syncope.  He is not been having any PND or orthopnea.  He has had no weight gain or edema.  He is getting around with his cane.  He started using his CPAP a little bit.     ROS: As stated in the HPI and negative for all other systems.  Studies Reviewed:    EKG:     03/10/2024 sinus rhythm, rate 79, right bundle branch block, no change from previous  Risk Assessment/Calculations:    CHA2DS2-VASc Score = 5   This indicates a 7.2% annual risk of stroke. The patient's  score is based upon: CHF History: 1 HTN History: 0 Diabetes History: 0 Stroke History: 2 Vascular Disease History: 0 Age Score: 2 Gender Score: 0    Physical Exam:   VS:  BP 130/86   Pulse 85   Ht 5' 11 (1.803 m)   Wt 216 lb (98 kg)   SpO2 95%   BMI 30.13 kg/m    Wt Readings from Last 3 Encounters:  03/19/24 216 lb (98 kg)  02/06/24 212 lb 12.8 oz (96.5 kg)  12/13/23 204 lb (92.5 kg)     GEN: Well nourished, well developed in no acute distress NECK: No JVD; No carotid bruits CARDIAC: RRR, no murmurs, rubs, gallops RESPIRATORY:  Clear to auscultation without rales, wheezing or rhonchi  ABDOMEN: Soft, non-tender, non-distended EXTREMITIES:  No edema; No deformity   ASSESSMENT AND PLAN:   Chronic systolic HF:   The patient has wanted very conservative therapy.  He allowed me to titrate beta-blockers slightly at the last visit but he has not wanted invasive procedures or increased meds.  He seems to be euvolemic.  His renal function would not allow med titration with ARNI, spironolactone , ACE inhibitor or ARB.  I did review the most recent labs and is up-to-date.  No change in therapy.  We talked again about salt and fluid restriction.  Atrial fibrillation:   He tolerates anticoagulation.  He is on the appropriate dose.  No change in therapy.  MR: MR was low on his most recent TEE.  No change in therapy.   AKI on CKD IIIb:   His creatinine was stable at 2.40.  He sees nephrology.   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  He is on chronic maintenance therapy.  No change in therapy.  Follow up with me in 6 months.  Signed, Lynwood Schilling, MD "

## 2024-03-19 ENCOUNTER — Encounter: Payer: Self-pay | Admitting: Cardiology

## 2024-03-19 ENCOUNTER — Ambulatory Visit: Admitting: Cardiology

## 2024-03-19 VITALS — BP 130/86 | HR 85 | Ht 71.0 in | Wt 216.0 lb

## 2024-03-19 DIAGNOSIS — N179 Acute kidney failure, unspecified: Secondary | ICD-10-CM

## 2024-03-19 DIAGNOSIS — I5022 Chronic systolic (congestive) heart failure: Secondary | ICD-10-CM | POA: Diagnosis not present

## 2024-03-19 DIAGNOSIS — I340 Nonrheumatic mitral (valve) insufficiency: Secondary | ICD-10-CM

## 2024-03-19 DIAGNOSIS — I4819 Other persistent atrial fibrillation: Secondary | ICD-10-CM

## 2024-03-19 NOTE — Patient Instructions (Signed)

## 2024-03-20 ENCOUNTER — Ambulatory Visit: Admitting: Family

## 2024-03-20 ENCOUNTER — Ambulatory Visit: Payer: Self-pay | Admitting: Family

## 2024-03-20 ENCOUNTER — Ambulatory Visit (HOSPITAL_BASED_OUTPATIENT_CLINIC_OR_DEPARTMENT_OTHER)
Admission: RE | Admit: 2024-03-20 | Discharge: 2024-03-20 | Disposition: A | Discharge status: Home / Self Care | Source: Ambulatory Visit | Attending: Family | Admitting: Family

## 2024-03-20 VITALS — BP 121/63 | HR 64 | Temp 98.6°F | Resp 16 | Ht 71.0 in | Wt 215.0 lb

## 2024-03-20 DIAGNOSIS — J101 Influenza due to other identified influenza virus with other respiratory manifestations: Secondary | ICD-10-CM | POA: Insufficient documentation

## 2024-03-20 DIAGNOSIS — E538 Deficiency of other specified B group vitamins: Secondary | ICD-10-CM

## 2024-03-20 DIAGNOSIS — I5022 Chronic systolic (congestive) heart failure: Secondary | ICD-10-CM

## 2024-03-20 DIAGNOSIS — R918 Other nonspecific abnormal finding of lung field: Secondary | ICD-10-CM | POA: Diagnosis not present

## 2024-03-20 DIAGNOSIS — D500 Iron deficiency anemia secondary to blood loss (chronic): Secondary | ICD-10-CM | POA: Insufficient documentation

## 2024-03-20 NOTE — Assessment & Plan Note (Signed)
 He has had GI evaluation due to heme positive stool back in 2024 which included colo/endo.

## 2024-03-20 NOTE — Patient Instructions (Signed)
" °  VISIT SUMMARY: You came in for a follow-up after testing positive for flu A and experiencing shortness of breath. Your symptoms have improved significantly, and you have not had a fever in the last few days. We discussed your anemia, B12 levels, and recent respiratory issues.  YOUR PLAN: -IRON DEFICIENCY ANEMIA: Iron deficiency anemia means you have a lower than normal number of red blood cells due to a lack of iron. We rechecked your blood count and iron levels to monitor your condition.  -B12 DEFICIENCY: B12 deficiency means your body has lower than normal levels of vitamin B12, which is important for nerve function and red blood cell production. We updated your B12 level to ensure your supplement is working.  -INFLUENZA A WITH RESPIRATORY SYMPTOMS: Influenza A is a type of flu virus that can cause respiratory symptoms. Your symptoms have improved, and your oxygen  levels are good. We repeated your chest x-ray because of crackling sounds in your right lower lung.  -RIGHT BASILAR PULMONARY ATELECTASIS: Right basilar pulmonary atelectasis means there is a partial collapse of the lower part of your right lung. We repeated your chest x-ray to monitor this condition.  INSTRUCTIONS: Please follow up with us  after your chest x-ray results are available. Continue taking your B12 supplement as directed. If you experience any new or worsening symptoms, contact our office immediately.                      Contains text generated by [employer].                         "

## 2024-03-20 NOTE — Progress Notes (Signed)
 "  Subjective:     Patient ID: Brandon [name], male    DOB: [birth year], 84 y.o.   MRN: [id number]  Chief Complaint  Patient presents with   Follow-up    Here for follow up after ED visit for asthma and Flu-A    HPI  Discussed the use of AI scribe software for clinical note transcription with the patient, who gave verbal consent to proceed.  History of Present Illness Brandon [name] is an 84 year old male who presents for follow-up after testing positive for flu A and experiencing shortness of breath.  He was seen in the emergency room on March 10, 2024, where he tested positive for flu A. He experienced shortness of breath and had a fever of 101.64F. He was prescribed Tamiflu  and prednisone , but did not take Tamiflu  as it was unavailable and when it did become available,  he had passed the point where it would be effective. His shortness of breath has significantly improved, and he has not had a fever in the last few days.  He reports being anemic. His BNP levels were elevated on recent labs. His weight has been stable, and he reports minimal swelling. He is on a B12 supplement, as his levels were on the low end of normal a few months ago. He had a colonoscopy and endoscopy in 2024 due to anemia.  His Eliquis  medication cost has increased to $259, which he attributes to the beginning of the year deductible.  He recalls hosting a large family gathering for Christmas, where several attendees contracted the flu.      Health Maintenance Due  Topic Date Due   Zoster Vaccines- Shingrix  (2 of 2) 06/10/2021   COVID-19 Vaccine (7 - 2025-26 season) 11/14/2023    Past Medical History:  Diagnosis Date   Anemia    Arm DVT (deep venous thromboembolism), acute, right (HCC)    Asthma    Hx of childhood asthma, disappeared for a while, then resurfaced 6-7 years ago.    Cardiogenic shock (HCC) 04/16/2023   Cardiomyopathy    with a negative cardiac catheterization in the past. 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     Past Surgical History:  Procedure Laterality Date   CARDIOVERSION N/A 02/28/2023   Procedure: CARDIOVERSION;  Surgeon: Pietro Redell RAMAN, MD;  Location: [facility];  Service: Cardiovascular;  Laterality: N/A;   CARDIOVERSION N/A 04/19/2023   Procedure: CARDIOVERSION;  Surgeon: Gardenia Led, DO;  Location: [facility];  Service: Cardiovascular;  Laterality: N/A;   None     RIGHT HEART CATH N/A 01/13/2023   Procedure: RIGHT HEART CATH;  Surgeon: Gardenia Led, DO;  Location: [facility];  Service: Cardiovascular;  Laterality: N/A;   TRANSESOPHAGEAL ECHOCARDIOGRAM (CATH LAB) N/A 01/14/2023   Procedure: TRANSESOPHAGEAL ECHOCARDIOGRAM;  Surgeon: Francyne Headland, MD;  Location: [facility];  Service: Cardiovascular;  Laterality: N/A;   TRANSESOPHAGEAL ECHOCARDIOGRAM (CATH LAB) N/A 02/28/2023   Procedure: TRANSESOPHAGEAL ECHOCARDIOGRAM;  Surgeon: Pietro Redell RAMAN, MD;  Location: [facility];  Service: Cardiovascular;  Laterality: N/A;   TRANSESOPHAGEAL ECHOCARDIOGRAM (CATH LAB) N/A 04/19/2023   Procedure: TRANSESOPHAGEAL ECHOCARDIOGRAM;  Surgeon: Gardenia Led, DO;  Location: [facility];  Service: Cardiovascular;  Laterality: N/A;    Family History  Problem Relation Age of Onset   Multiple myeloma Father    Lupus Sister        died at 63   Diabetes Mellitus II Sister        died from covid-19   Neuropathy Sister    Asthma Daughter    Colon cancer Neg Hx    Esophageal cancer Neg Hx    Stomach cancer Neg Hx    Pancreatic cancer Neg Hx     Social History   Socioeconomic History   Marital status: Married    Spouse name: Lindajo   Number of  children: 3   Years of education: Not on file   Highest education level: Bachelor's degree (e.g., BA, AB, BS)  Occupational History   Occupation: retired  Tobacco Use   Smoking status: Former   Smokeless tobacco: Never   Tobacco comments:    quit smoling in 03/27/1988. started when 18, 1 ppd  Vaping Use   Vaping status: Never Used  Substance and Sexual Activity   Alcohol  use: Not Currently    Comment: occ   Drug use: Never   Sexual activity: Not Currently  Other Topics Concern   Not on file  Social History Narrative   Grew up in [place], attended [employer]. First wife died of breast ca in 03/27/1997. 3 children. Remarried- 8 years.    Retired- worked as a Secretary/administrator in [employer]    Pt signed designated party release granting access to [facility] to his wife Lindajo. Detailed message may be left on home or cell phone.    Social Drivers of Health   Tobacco Use: Medium Risk (03/19/2024)   Patient History    Smoking Tobacco Use: Former    Smokeless Tobacco Use: Never    Passive Exposure: Not on file  Financial Resource Strain: Low Risk (08/09/2023)   Overall Financial Resource Strain (CARDIA)    Difficulty of Paying Living Expenses: Not hard at all  Food Insecurity: No Food Insecurity (09/07/2023)   Epic    Worried About Programme Researcher, Broadcasting/film/video in the Last Year: Never true    Ran Out of Food in the Last Year: Never true  Transportation Needs: No Transportation Needs (09/07/2023)   Epic    Lack of Transportation (Medical): No    Lack of Transportation (Non-Medical): No  Physical Activity: Insufficiently Active (08/09/2023)   Exercise Vital Sign    Days of Exercise per Week: 3 days    Minutes of Exercise per Session: 30 min  Stress: No Stress Concern Present (08/09/2023)   [employer] of Occupational Health - Occupational Stress Questionnaire    Feeling of Stress : Not at all  Social Connections: Socially Integrated (08/09/2023)   Social Connection and Isolation Panel     Frequency of Communication with Friends and Family: More than three times a week    Frequency of Social Gatherings with Friends and Family: Three times a week    Attends Religious Services: More than 4 times per year    Active Member of Clubs or Organizations: Yes    Attends Banker Meetings: More than 4 times per year    Marital Status: Married  Catering Manager Violence: Not At Risk (09/07/2023)   Epic    Fear of Current or Ex-Partner: No    Emotionally Abused: No    Physically  Abused: No    Sexually Abused: No  Depression (PHQ2-9): Low Risk (03/20/2024)   Depression (PHQ2-9)    PHQ-2 Score: 2  Alcohol  Screen: Low Risk (08/09/2023)   Alcohol  Screen    Last Alcohol  Screening Score (AUDIT): 0  Housing: Unknown (09/07/2023)   Epic    Unable to Pay for Housing in the Last Year: No    Number of Times Moved in the Last Year: Not on file    Homeless in the Last Year: No  Utilities: Not At Risk (09/07/2023)   Epic    Threatened with loss of utilities: No  Health Literacy: Adequate Health Literacy (08/09/2023)   B1300 Health Literacy    Frequency of need for help with medical instructions: Never    Outpatient Medications Prior to Visit  Medication Sig Dispense Refill   acetaminophen  (TYLENOL ) 500 MG tablet Take 500 mg by mouth 2 (two) times daily. Morning and bedtime     albuterol  (PROVENTIL ) (2.5 MG/3ML) 0.083% nebulizer solution Take 3 mLs (2.5 mg total) by nebulization every 6 (six) hours as needed for wheezing or shortness of breath. 150 mL 3   albuterol  (VENTOLIN  HFA) 108 (90 Base) MCG/ACT inhaler Inhale 2 puffs into the lungs every 6 (six) hours as needed for wheezing or shortness of breath. 8 g 5   amiodarone  (PACERONE ) 200 MG tablet Take 1 tablet (200 mg total) by mouth daily. 90 tablet 3   apixaban  (ELIQUIS ) 2.5 MG TABS tablet Take 1 tablet (2.5 mg total) by mouth 2 (two) times daily. 90 tablet 3   cholecalciferol (VITAMIN D3) 25 MCG (1000 UNIT) tablet Take 2,000 Units  by mouth daily.     fluticasone -salmeterol (ADVAIR) 500-50 MCG/ACT AEPB Inhale 1 puff into the lungs in the morning and at bedtime. 60 each 11   gabapentin  (NEURONTIN ) 300 MG capsule Take 1 capsule (300 mg total) by mouth at bedtime. 90 capsule 5   leflunomide  (ARAVA ) 20 MG tablet Take 1 tablet (20 mg total) by mouth daily.     metoprolol  succinate (TOPROL -XL) 50 MG 24 hr tablet Take 0.5 tablets (25 mg total) by mouth daily. Take with or immediately following a meal. 90 tablet 3   montelukast  (SINGULAIR ) 10 MG tablet Take 1 tablet (10 mg total) by mouth at bedtime. 90 tablet 1   rosuvastatin  (CRESTOR ) 10 MG tablet Take 1 tablet (10 mg total) by mouth daily. 90 tablet 3   tamsulosin  (FLOMAX ) 0.4 MG CAPS capsule Take 1 capsule (0.4 mg total) by mouth daily. 90 capsule 1   torsemide  (DEMADEX ) 20 MG tablet Take 2.5 tablets (50 mg total) by mouth daily. 225 tablet 1   oseltamivir  (TAMIFLU ) 75 MG capsule Take 1 capsule (75 mg total) by mouth every 12 (twelve) hours. (Patient not taking: Reported on 03/20/2024) 10 capsule 0   No facility-administered medications prior to visit.    Allergies[1]  ROS See HPI    Objective:    Physical Exam Constitutional:      General: He is not in acute distress.    Appearance: He is well-developed.  HENT:     Head: Normocephalic and atraumatic.  Cardiovascular:     Rate and Rhythm: Normal rate and regular rhythm.     Heart sounds: No murmur heard. Pulmonary:     Effort: Pulmonary effort is normal. No respiratory distress.     Breath sounds: Examination of the right-middle field reveals rales. Examination of the right-lower field reveals rales. Rales present. No wheezing.  Skin:  General: Skin is warm and dry.  Neurological:     Mental Status: He is alert and oriented to person, place, and time.  Psychiatric:        Behavior: Behavior normal.        Thought Content: Thought content normal.      BP 121/63 (BP Location: Right Arm, Patient Position:  Sitting, Cuff Size: Large)   Pulse 64   Temp 98.6 F (37 C) (Oral)   Resp 16   Ht 5' 11 (1.803 m)   Wt 215 lb (97.5 kg)   SpO2 99%   BMI 29.99 kg/m  Wt Readings from Last 3 Encounters:  03/20/24 215 lb (97.5 kg)  03/19/24 216 lb (98 kg)  02/06/24 212 lb 12.8 oz (96.5 kg)       Assessment & Plan:   Problem List Items Addressed This Visit       Unprioritized   Iron deficiency anemia due to chronic blood loss - Primary   Relevant Orders   CBC w/Diff   IBC + Ferritin   Influenza A   Clinically resolved.  RLL crackles noted on exam today- followed by CXR which was normal today.        CHRONIC SYSTOLIC HEART FAILURE   No obvious pulmonary edema on CXR, had high BNP in ED but followed yesterday with his cardiologist and no changes in medical management were made.       B12 deficiency   Relevant Orders   B12   Other Visit Diagnoses       Lung field abnormal finding on examination       Relevant Orders   DG Chest 2 View (Completed)       I have discontinued Latron E. Crager's oseltamivir . I am also having him maintain his acetaminophen , rosuvastatin , leflunomide , metoprolol  succinate, apixaban , cholecalciferol, amiodarone , albuterol , albuterol , tamsulosin , montelukast , gabapentin , fluticasone -salmeterol, and torsemide .  No orders of the defined types were placed in this encounter.     [1]  Allergies Allergen Reactions   Ace Inhibitors Cough   Isosorb Dinitrate-Hydralazine Other (See Comments)    dizziness/hypotensive   "

## 2024-03-20 NOTE — Assessment & Plan Note (Signed)
 Clinically resolved.  RLL crackles noted on exam today- followed by CXR which was normal today.

## 2024-03-20 NOTE — Assessment & Plan Note (Signed)
 No obvious pulmonary edema on CXR, had high BNP in ED but followed yesterday with his cardiologist and no changes in medical management were made.

## 2024-03-21 ENCOUNTER — Other Ambulatory Visit

## 2024-03-29 ENCOUNTER — Telehealth: Payer: Self-pay | Admitting: Family

## 2024-03-29 ENCOUNTER — Telehealth: Payer: Self-pay | Admitting: Pharmacist

## 2024-03-29 DIAGNOSIS — M069 Rheumatoid arthritis, unspecified: Secondary | ICD-10-CM

## 2024-03-29 NOTE — Telephone Encounter (Signed)
-----   Message from Eleanor Ponto, NP sent at 03/20/2024  5:38 PM EST ----- RICK- Mr. Magowan wanted me to reach out to you to arrange follow up with him. He was concerned that his Eliquis  copay was $259- I told him that it might be because it is the beginning of the year and he has not yet met his rx deductible.  Thanks.  [name]

## 2024-03-29 NOTE — Telephone Encounter (Signed)
 Copied from CRM #[id number]. Topic: General - Other >> Mar 29, 2024 11:10 AM Deleta RAMAN wrote: Reason for CRM: Friddie from [facility] rheumatology is calling for a [employer] referral patient has an appointment on 1/29 to see Dr.Beekman. Fax [phone] Diagnosis code M05.79

## 2024-03-29 NOTE — Telephone Encounter (Signed)
 Patient called regarding Eliquis  cost. His 2026 [facility] plan has a deductible of $355, then cost of Eliquis  will be 21% coinsurance.  Patient's first fill for #60 tablets would be $250, Second fill $136 and then $53 / month.  Discussed his 2026 plan and deductible. Patient has filled #14 Eliquis  2.5mg  tablets and cost was about $58. He plans to purchase every 7 days until he mets deductible and cost for 30 days decreases to $53/month.  I will check when back in the office for a few weeks of samples to help him save for his next Eliquis  purchase.   We have tried to apply for BMS / Eliquis  patient assistance program in the past but patient did not meet the 3% out of pocket spend requirement.

## 2024-03-30 NOTE — Addendum Note (Signed)
 Addended by: DARYL SETTER on: 03/30/2024 12:28 PM   Modules accepted: Orders

## 2024-04-02 MED ORDER — APIXABAN 2.5 MG PO TABS: 2.5000 mg | ORAL_TABLET | Freq: Two times a day (BID) | ORAL | 0 refills | Status: AC

## 2024-04-02 NOTE — Addendum Note (Signed)
 Addended by: CARLA MILLING B on: 04/02/2024 09:19 AM   Modules accepted: Orders

## 2024-04-02 NOTE — Telephone Encounter (Signed)
 Provided patient with 3 weeks of Eliquis  2.5mg  samples. Patient notified.

## 2024-04-04 ENCOUNTER — Ambulatory Visit: Admitting: Cardiology

## 2024-04-10 NOTE — Telephone Encounter (Signed)
 Patient notified this referral was entered 03/30/24  Copied from CRM #[id number]. Topic: Referral - Question >> Apr 09, 2024  2:07 PM Laymon HERO wrote: Reason for CRM: Patient needing a referral to Dr Lynwood Ramsay Rheumatologist- he has an appt there on Thursday 1/29

## 2024-04-20 ENCOUNTER — Ambulatory Visit
Admission: EM | Admit: 2024-04-20 | Discharge: 2024-04-20 | Disposition: A | Discharge status: Home / Self Care | Source: Home / Self Care

## 2024-04-20 ENCOUNTER — Ambulatory Visit (INDEPENDENT_AMBULATORY_CARE_PROVIDER_SITE_OTHER)

## 2024-04-20 ENCOUNTER — Ambulatory Visit: Payer: Self-pay | Admitting: *Deleted

## 2024-04-20 ENCOUNTER — Ambulatory Visit: Payer: Self-pay | Admitting: Nurse Practitioner

## 2024-04-20 DIAGNOSIS — J4521 Mild intermittent asthma with (acute) exacerbation: Secondary | ICD-10-CM

## 2024-04-20 DIAGNOSIS — R0602 Shortness of breath: Secondary | ICD-10-CM

## 2024-04-20 DIAGNOSIS — J209 Acute bronchitis, unspecified: Secondary | ICD-10-CM

## 2024-04-20 MED ORDER — BENZONATATE 100 MG PO CAPS: 100.0000 mg | ORAL_CAPSULE | Freq: Three times a day (TID) | ORAL | 0 refills | Status: AC

## 2024-04-20 MED ORDER — PREDNISONE 20 MG PO TABS: 20.0000 mg | ORAL_TABLET | Freq: Every day | ORAL | 0 refills | Status: AC

## 2024-04-20 MED ORDER — DOXYCYCLINE HYCLATE 100 MG PO CAPS: 100.0000 mg | ORAL_CAPSULE | Freq: Two times a day (BID) | ORAL | 0 refills | Status: AC

## 2024-04-20 NOTE — ED Triage Notes (Signed)
 Pt placed on 3L of O2. Sats at 88-89% after coming back from Xray. Provider notified.

## 2024-04-20 NOTE — ED Notes (Signed)
 Pt SATS at 94% and HR: 99 while walking and off of O2. Provider notified.

## 2024-04-20 NOTE — Discharge Instructions (Addendum)
 Start doxycycline  antibiotic twice daily for 7 days.  Start prednisone  daily for 4 days as well.  May take Tessalon  3 times a day as needed for your cough.  Continue your nebulizer/albuterol  inhaler as needed.  Please contact your PCP first thing Monday morning to make them aware that you have been off your Advair and you are having the symptoms so they can follow-up with you as soon as possible.  Please pick up your Advair and take it as soon as you can.  Please go to the ER if you develop any worsening symptoms.  This includes but is not limited to worsening cough or shortness of breath, fever, or any new concerns that arise.  Hope you feel better soon!

## 2024-04-20 NOTE — ED Triage Notes (Signed)
 Pt present with c/o sob x 1 week. 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 Pt states he feels fine when sitting and after moving around for a while he begins to feel SOB. Pt states he has started to cough, has not worsened but has not gotten better either.  Home interventions: Nebulizer at home and the inhaler as needed.

## 2024-04-20 NOTE — ED Provider Notes (Addendum)
 " [facility]    CSN: [id number] Arrival date & time: 04/20/24  1750      History   Chief Complaint Chief Complaint  Patient presents with   Shortness of Breath    HPI Brandon [name] is a 84 y.o. male with a past medical history including hypertension, asthma, pulmonary hypertension, CHF, CVA, CAD presents for shortness of breath.  Patient reports he had the flu at the end of December and has had a persistent cough with shortness of breath since then.  He states over the past week he feels like his shortness of breath has worsened specifically with activity.  He states he is out of his Advair inhaler for the past week as well due to deductible cost.  He states unable to pick it up by Wednesday of next week.  He does state his nebulizer give some relief.  He does endorse a productive cough with clear sputum but denies any fevers, congestion, sore throat, ear pain, body aches, vomiting or diarrhea.  He denies history of COPD.  He has been using his inhalers and nebulizers with relief.  He did denies any increase in his normal lower extremity swelling and denies orthopnea.  He states he did have a chest x-ray January 6 from his PCP due to his cough and it was negative.  No other concerns at this time.   Shortness of Breath Associated symptoms: cough     Past Medical History:  Diagnosis Date   Anemia    Arm DVT (deep venous thromboembolism), acute, right (HCC)    Asthma    Hx of childhood asthma, disappeared for a while, then resurfaced 6-7 years ago.    Cardiogenic shock (HCC) 04/16/2023   Cardiomyopathy    with a negative cardiac catheterization in the past. 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   Unspecified disorder resulting from impaired renal function     Patient Active  Problem List   Diagnosis Date Noted   Influenza A 03/20/2024   Iron deficiency anemia due to chronic blood loss 03/20/2024   Acute deep vein thrombosis (DVT) of right upper extremity (HCC) 05/04/2023   Paroxysmal atrial fibrillation (HCC) 05/04/2023   Chronic diastolic CHF (congestive heart failure) (HCC) 05/04/2023   Chronic kidney disease (CKD), stage IV (severe) (HCC) 05/04/2023   History of COPD 05/04/2023   Pulmonary hypertension (HCC) 05/04/2023   HFrEF (heart failure with reduced ejection fraction) (HCC) 04/24/2023   Atrial fibrillation with RVR (HCC) 04/15/2023   Persistent atrial fibrillation (HCC) 01/14/2023   Mitral regurgitation 01/14/2023   Acute kidney injury superimposed on chronic kidney disease 01/14/2023   Acute on chronic systolic heart failure (HCC) 01/10/2023   Neuropathy 09/21/2022   Abnormal brain MRI 04/21/2022   Hyperlipidemia 04/21/2022   History of CVA (cerebrovascular accident) 04/18/2022   Normocytic anemia 04/18/2022   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   Coronary artery calcification 03/30/2022   B12 deficiency 08/13/2020   Rheumatoid arthritis (HCC) 08/12/2020   BPH (benign prostatic hyperplasia) 08/12/2020   Folic acid  deficiency 08/12/2020   Stage 3a chronic kidney disease (HCC) 10/17/2019   Heme positive stool 02/01/2019   RBBB 10/03/2018   Depression 12/09/2015   Asthma 08/13/2009   Hyperglycemia 08/13/2009   OSA (  obstructive sleep apnea) 01/23/2009   CHRONIC SYSTOLIC HEART FAILURE 09/11/2008   Obesity, unspecified 08/26/2008   Essential hypertension 06/28/2008   Cardiomyopathy (HCC) 06/28/2008    Past Surgical History:  Procedure Laterality Date   CARDIOVERSION N/A 02/28/2023   Procedure: CARDIOVERSION;  Surgeon: Pietro Redell RAMAN, MD;  Location: [facility];  Service: Cardiovascular;  Laterality: N/A;   CARDIOVERSION N/A 04/19/2023   Procedure: CARDIOVERSION;  Surgeon: Gardenia Led, DO;  Location: [facility];  Service:  Cardiovascular;  Laterality: N/A;   None     RIGHT HEART CATH N/A 01/13/2023   Procedure: RIGHT HEART CATH;  Surgeon: Gardenia Led, DO;  Location: [facility];  Service: Cardiovascular;  Laterality: N/A;   TRANSESOPHAGEAL ECHOCARDIOGRAM (CATH LAB) N/A 01/14/2023   Procedure: TRANSESOPHAGEAL ECHOCARDIOGRAM;  Surgeon: Francyne Headland, MD;  Location: [facility];  Service: Cardiovascular;  Laterality: N/A;   TRANSESOPHAGEAL ECHOCARDIOGRAM (CATH LAB) N/A 02/28/2023   Procedure: TRANSESOPHAGEAL ECHOCARDIOGRAM;  Surgeon: Pietro Redell RAMAN, MD;  Location: [facility];  Service: Cardiovascular;  Laterality: N/A;   TRANSESOPHAGEAL ECHOCARDIOGRAM (CATH LAB) N/A 04/19/2023   Procedure: TRANSESOPHAGEAL ECHOCARDIOGRAM;  Surgeon: Gardenia Led, DO;  Location: [facility];  Service: Cardiovascular;  Laterality: N/A;       Home Medications    Prior to Admission medications  Medication Sig Start Date End Date Taking? Authorizing Provider  benzonatate  (TESSALON ) 100 MG capsule Take 1 capsule (100 mg total) by mouth every 8 (eight) hours. 04/20/24  Yes Anush Wiedeman, Jodi R, NP  doxycycline  (VIBRAMYCIN ) 100 MG capsule Take 1 capsule (100 mg total) by mouth 2 (two) times daily. 04/20/24  Yes Jakyiah Briones, Jodi R, NP  predniSONE  (DELTASONE ) 20 MG tablet Take 1 tablet (20 mg total) by mouth daily with breakfast for 4 days. 04/20/24 04/24/24 Yes Erion Weightman, Jodi R, NP  acetaminophen  (TYLENOL ) 500 MG tablet Take 500 mg by mouth 2 (two) times daily. Morning and bedtime    [provider]  albuterol  (PROVENTIL ) (2.5 MG/3ML) 0.083% nebulizer solution Take 3 mLs (2.5 mg total) by nebulization every 6 (six) hours as needed for wheezing or shortness of breath. 11/29/23   O'Sullivan, [name], NP  albuterol  (VENTOLIN  HFA) 108 (90 Base) MCG/ACT inhaler Inhale 2 puffs into the lungs every 6 (six) hours as needed for wheezing or shortness of breath. 12/13/23   O'Sullivan, [name], NP  amiodarone  (PACERONE ) 200  MG tablet Take 1 tablet (200 mg total) by mouth daily. 09/14/23   Lavona Agent, MD  apixaban  (ELIQUIS ) 2.5 MG TABS tablet Take 1 tablet (2.5 mg total) by mouth 2 (two) times daily. 04/02/24   O'Sullivan, [name], NP  cholecalciferol (VITAMIN D3) 25 MCG (1000 UNIT) tablet Take 2,000 Units by mouth daily.    [provider]  fluticasone -salmeterol (ADVAIR) 500-50 MCG/ACT AEPB Inhale 1 puff into the lungs in the morning and at bedtime. 02/06/24   Hope Almarie ORN, NP  gabapentin  (NEURONTIN ) 300 MG capsule Take 1 capsule (300 mg total) by mouth at bedtime. 12/13/23   O'Sullivan, [name], NP  leflunomide  (ARAVA ) 20 MG tablet Take 1 tablet (20 mg total) by mouth daily. 06/09/23   O'Sullivan, [name], NP  metoprolol  succinate (TOPROL -XL) 50 MG 24 hr tablet Take 0.5 tablets (25 mg total) by mouth daily. Take with or immediately following a meal. 08/16/23   Lavona Agent, MD  montelukast  (SINGULAIR ) 10 MG tablet Take 1 tablet (10 mg total) by mouth at bedtime. 12/13/23   O'Sullivan, [name], NP  rosuvastatin  (  CRESTOR ) 10 MG tablet Take 1 tablet (10 mg total) by mouth daily. 06/09/23   O'Sullivan, [name], NP  tamsulosin  (FLOMAX ) 0.4 MG CAPS capsule Take 1 capsule (0.4 mg total) by mouth daily. 12/13/23   O'Sullivan, [name], NP  torsemide  (DEMADEX ) 20 MG tablet Take 2.5 tablets (50 mg total) by mouth daily. 02/13/24   Daryl Setter, NP    Family History Family History  Problem Relation Age of Onset   Multiple myeloma Father    Lupus Sister        died at 74   Diabetes Mellitus II Sister        died from covid-19   Neuropathy Sister    Asthma Daughter    Colon cancer Neg Hx    Esophageal cancer Neg Hx    Stomach cancer Neg Hx    Pancreatic cancer Neg Hx     Social History Social History[1]   Allergies   Ace inhibitors and Isosorb dinitrate-hydralazine   Review of Systems Review of Systems  Respiratory:  Positive for cough and shortness of breath.      Physical Exam Triage  Vital Signs ED Triage Vitals  Encounter Vitals Group     BP 04/20/24 1806 (!) 143/88     Girls Systolic BP Percentile --      Girls Diastolic BP Percentile --      Boys Systolic BP Percentile --      Boys Diastolic BP Percentile --      Pulse Rate 04/20/24 1806 73     Resp 04/20/24 1806 15     Temp 04/20/24 1806 99.1 F (37.3 C)     Temp src --      SpO2 04/20/24 1806 91 %     Weight --      Height --      Head Circumference --      Peak Flow --      Pain Score 04/20/24 1805 0     Pain Loc --      Pain Education --      Exclude from Growth Chart --    No data found.  Updated Vital Signs BP (!) 143/88 (BP Location: Right Arm)   Pulse 73   Temp 99.1 F (37.3 C)   Resp 15   SpO2 93%   Visual Acuity Right Eye Distance:   Left Eye Distance:   Bilateral Distance:    Right Eye Near:   Left Eye Near:    Bilateral Near:     Physical Exam Vitals and nursing note reviewed.  Constitutional:      General: He is not in acute distress.    Appearance: Normal appearance. He is not ill-appearing.  HENT:     Head: Normocephalic and atraumatic.  Eyes:     Pupils: Pupils are equal, round, and reactive to light.  Cardiovascular:     Rate and Rhythm: Normal rate and regular rhythm.     Heart sounds: Normal heart sounds.  Pulmonary:     Effort: Pulmonary effort is normal.     Breath sounds: Normal breath sounds. No wheezing, rhonchi or rales.  Musculoskeletal:     Right lower leg: Edema present.     Left lower leg: Edema present.     Comments: +1 pitting edema bilaterally which pt states is his baseline   Skin:    General: Skin is warm and dry.  Neurological:     General: No focal deficit present.     Mental Status:  He is alert and oriented to person, place, and time.  Psychiatric:        Mood and Affect: Mood normal.        Behavior: Behavior normal.      UC Treatments / Results  Labs (all labs ordered are listed, but only abnormal results are displayed) Labs  Reviewed - No data to display  EKG   Radiology DG Chest 2 View Result Date: 04/20/2024 EXAM: 2 VIEW(S) XRAY OF THE CHEST 04/20/2024 06:24:16 PM COMPARISON: 03/20/2024 CLINICAL HISTORY: Cough and shortness of breath worsening for 1 week. FINDINGS: LUNGS AND PLEURA: Minor streaky atelectasis or scarring in lung bases. No pleural effusion. No pneumothorax. HEART AND MEDIASTINUM: Tortuous aorta. No acute abnormality of the cardiac and mediastinal silhouettes. BONES AND SOFT TISSUES: No acute osseous abnormality. IMPRESSION: 1. No acute cardiopulmonary abnormality. Electronically signed by: Dorethia Molt MD 04/20/2024 06:30 PM EST RP Workstation: [id number]    Procedures Procedures (including critical care time)  Medications Ordered in UC Medications - No data to display  Initial Impression / Assessment and Plan / UC Course  I have reviewed the triage vital signs and the nursing notes.  Pertinent labs & imaging results that were available during my care of the patient were reviewed by me and considered in my medical decision making (see chart for details).  Clinical Course as of 04/20/24 1903  Fri Apr 20, 2024  1903 94% [JM]    Clinical Course User Index [JM] Loreda Myla SAUNDERS, NP    Reviewed exam and symptoms with patient.  Chest x-ray is negative for pneumonia.  He declined nebulizer in clinic as he said he took 1 right before he got to the clinic.  Patient did well with 2 minute walk on room air satting 94%.  Discussed with patient likely shortness of breath increased/worsened after he ran out of his Advair.  He says he cannot pick it up until next week due to co-pay concerns.  Due to this I will do a low-dose prednisone  for 4 days.  Will also do Tessalon  as needed for cough.  He does have a low-grade fever in clinic will cover for any underlying bacterial cause with doxycycline  twice a day for 7 days.  Encouraged to monitor symptoms very closely and to go to the ER if he develops any worsening  or new symptoms, red flags reviewed.  I instructed him to contact his PCP first thing Monday morning to make them aware of his symptoms. Final Clinical Impressions(s) / UC Diagnoses   Final diagnoses:  Shortness of breath  Mild intermittent asthma with acute exacerbation  Acute bronchitis, unspecified organism     Discharge Instructions      Start doxycycline  antibiotic twice daily for 7 days.  Start prednisone  daily for 4 days as well.  May take Tessalon  3 times a day as needed for your cough.  Continue your nebulizer/albuterol  inhaler as needed.  Please contact your PCP first thing Monday morning to make them aware that you have been off your Advair and you are having the symptoms so they can follow-up with you as soon as possible.  Please pick up your Advair and take it as soon as you can.  Please go to the ER if you develop any worsening symptoms.  This includes but is not limited to worsening cough or shortness of breath, fever, or any new concerns that arise.  Hope you feel better soon!     ED Prescriptions     Medication  Sig Dispense Auth. Provider   benzonatate  (TESSALON ) 100 MG capsule Take 1 capsule (100 mg total) by mouth every 8 (eight) hours. 21 capsule Amoni Scallan, Jodi R, NP   predniSONE  (DELTASONE ) 20 MG tablet Take 1 tablet (20 mg total) by mouth daily with breakfast for 4 days. 4 tablet Tamiko Leopard, Jodi R, NP   doxycycline  (VIBRAMYCIN ) 100 MG capsule Take 1 capsule (100 mg total) by mouth 2 (two) times daily. 14 capsule Johnmark Geiger, Jodi R, NP      PDMP not reviewed this encounter.     Loreda Myla SAUNDERS, NP 04/20/24 1903    [1]  Social History Tobacco Use   Smoking status: Former   Smokeless tobacco: Never   Tobacco comments:    quit smoling in 1990. started when 18, 1 ppd  Vaping Use   Vaping status: Never Used  Substance Use Topics   Alcohol  use: Not Currently    Comment: occ   Drug use: Never     Loreda Myla SAUNDERS, NP 04/20/24 1903  "

## 2024-04-20 NOTE — Telephone Encounter (Signed)
" °  FYI Only or Action Required?: FYI only for provider: UC advised .  Patient was last seen in primary care on 03/20/2024 by Daryl Setter, NP.  Called Nurse Triage reporting Shortness of Breath.  Symptoms began several weeks ago.  Interventions attempted: Prescription medications: Albuterol  inhaler and nebulizer.  Symptoms are: gradually worsening.  Triage Disposition: See HCP Within 4 Hours (Or PCP Triage)  Patient/caregiver understands and will follow disposition?: Yes    Message from [facility] sent at 04/20/2024  2:15 PM EST  Summary: Shrotness of breath   Reason for Triage: Pt reports not feeling well, has shortness of breath, along with flu symptoms, with chest congestion, that does not seem to be going away.  Pt requesting a  appt for evaluation, as he feels like he's starting to feel worse.         Reason for Disposition  [1] Longstanding difficulty breathing (e.g., CHF, COPD, emphysema) AND [2] WORSE than normal  Answer Assessment - Initial Assessment Questions Patient states he was treated for flu at Christmas and did improve. Patient states he still has lingering cough and chest congestion with SOB with exertion that is worse than normal- causing him to use his Albuterol  more often. Patient does report wheezing at night which his nebulizer helps with. Patient is concerned about pneumonia and wishes to be seen. Patient advised no office appointment open- advised UC- he will go.    1. RESPIRATORY STATUS: Describe your breathing? (e.g., wheezing, shortness of breath, unable to speak, severe coughing)      SOB, chest congestion- cough 2. ONSET: When did this breathing problem begin?      Started since diagnosed flu- since christmas- patient did have follow up- xray was normal- no sign of pneumonia 3. PATTERN Does the difficult breathing come and go, or has it been constant since it started?      Comes and goes- most with exertion 4. SEVERITY: How bad is your  breathing? (e.g., mild, moderate, severe)      Mild- patient reports using nebulizer and inhaler more often 5. RECURRENT SYMPTOM: Have you had difficulty breathing before? If Yes, ask: When was the last time? and What happened that time?      Patient has asthma- hx SOB- nebulizer, prednisone  6. CARDIAC HISTORY: Do you have any history of heart disease? (e.g., heart attack, angina, bypass surgery, angioplasty)      Afib 7. LUNG HISTORY: Do you have any history of lung disease?  (e.g., pulmonary embolus, asthma, emphysema)     asthma 8. CAUSE: What do you think is causing the breathing problem?      Congestion in chest 9. OTHER SYMPTOMS: Do you have any other symptoms? (e.g., chest pain, cough, dizziness, fever, runny nose)     cough  Protocols used: Breathing Difficulty-A-AH  "

## 2024-04-30 ENCOUNTER — Other Ambulatory Visit

## 2024-06-12 ENCOUNTER — Ambulatory Visit: Admitting: Family

## 2024-06-19 ENCOUNTER — Ambulatory Visit: Admitting: Family

## 2024-08-09 ENCOUNTER — Ambulatory Visit
